# Patient Record
Sex: Male | Born: 1955 | Race: White | Hispanic: No | Marital: Single | Smoking: Never smoker
Health system: Southern US, Community
[De-identification: ages and names within clinical notes are randomized; demographics above are authoritative.]

## PROBLEM LIST (undated history)

## (undated) DIAGNOSIS — I4891 Unspecified atrial fibrillation: Secondary | ICD-10-CM

## (undated) DIAGNOSIS — I5042 Chronic combined systolic (congestive) and diastolic (congestive) heart failure: Secondary | ICD-10-CM

## (undated) DIAGNOSIS — I5032 Chronic diastolic (congestive) heart failure: Secondary | ICD-10-CM

## (undated) DIAGNOSIS — T8859XA Other complications of anesthesia, initial encounter: Secondary | ICD-10-CM

## (undated) DIAGNOSIS — G4733 Obstructive sleep apnea (adult) (pediatric): Secondary | ICD-10-CM

## (undated) DIAGNOSIS — F32A Depression, unspecified: Secondary | ICD-10-CM

## (undated) DIAGNOSIS — E119 Type 2 diabetes mellitus without complications: Secondary | ICD-10-CM

## (undated) DIAGNOSIS — I509 Heart failure, unspecified: Secondary | ICD-10-CM

## (undated) DIAGNOSIS — G4730 Sleep apnea, unspecified: Secondary | ICD-10-CM

## (undated) DIAGNOSIS — Z9289 Personal history of other medical treatment: Secondary | ICD-10-CM

## (undated) HISTORY — DX: Personal history of other medical treatment: Z92.89

## (undated) HISTORY — DX: Morbid (severe) obesity due to excess calories: E66.01

## (undated) HISTORY — DX: Chronic diastolic (congestive) heart failure: I50.32

## (undated) HISTORY — DX: Unspecified atrial fibrillation: I48.91

## (undated) HISTORY — DX: Type 2 diabetes mellitus without complications: E11.9

---

## 2011-03-14 DIAGNOSIS — I4819 Other persistent atrial fibrillation: Secondary | ICD-10-CM

## 2011-03-14 HISTORY — DX: Other persistent atrial fibrillation: I48.19

## 2011-03-26 ENCOUNTER — Emergency Department (HOSPITAL_COMMUNITY): Payer: Self-pay

## 2011-03-26 ENCOUNTER — Encounter (HOSPITAL_COMMUNITY): Payer: Self-pay | Admitting: Emergency Medicine

## 2011-03-26 ENCOUNTER — Inpatient Hospital Stay (HOSPITAL_COMMUNITY)
Admission: EM | Admit: 2011-03-26 | Discharge: 2011-04-04 | DRG: 308 | Disposition: A | Discharge status: Home / Self Care | Payer: 59 | Attending: Family Medicine | Admitting: Family Medicine

## 2011-03-26 ENCOUNTER — Other Ambulatory Visit: Payer: Self-pay

## 2011-03-26 DIAGNOSIS — I2489 Other forms of acute ischemic heart disease: Secondary | ICD-10-CM | POA: Diagnosis present

## 2011-03-26 DIAGNOSIS — E119 Type 2 diabetes mellitus without complications: Secondary | ICD-10-CM | POA: Diagnosis present

## 2011-03-26 DIAGNOSIS — Z6841 Body Mass Index (BMI) 40.0 and over, adult: Secondary | ICD-10-CM

## 2011-03-26 DIAGNOSIS — I2510 Atherosclerotic heart disease of native coronary artery without angina pectoris: Secondary | ICD-10-CM | POA: Diagnosis present

## 2011-03-26 DIAGNOSIS — J069 Acute upper respiratory infection, unspecified: Secondary | ICD-10-CM | POA: Diagnosis present

## 2011-03-26 DIAGNOSIS — Z7901 Long term (current) use of anticoagulants: Secondary | ICD-10-CM

## 2011-03-26 DIAGNOSIS — G4733 Obstructive sleep apnea (adult) (pediatric): Secondary | ICD-10-CM | POA: Diagnosis present

## 2011-03-26 DIAGNOSIS — Z23 Encounter for immunization: Secondary | ICD-10-CM

## 2011-03-26 DIAGNOSIS — I2789 Other specified pulmonary heart diseases: Secondary | ICD-10-CM | POA: Diagnosis present

## 2011-03-26 DIAGNOSIS — I4891 Unspecified atrial fibrillation: Principal | ICD-10-CM | POA: Diagnosis present

## 2011-03-26 DIAGNOSIS — I4819 Other persistent atrial fibrillation: Secondary | ICD-10-CM | POA: Diagnosis present

## 2011-03-26 DIAGNOSIS — I509 Heart failure, unspecified: Secondary | ICD-10-CM | POA: Diagnosis present

## 2011-03-26 DIAGNOSIS — I517 Cardiomegaly: Secondary | ICD-10-CM | POA: Diagnosis present

## 2011-03-26 DIAGNOSIS — Z79899 Other long term (current) drug therapy: Secondary | ICD-10-CM

## 2011-03-26 DIAGNOSIS — I5030 Unspecified diastolic (congestive) heart failure: Secondary | ICD-10-CM | POA: Diagnosis present

## 2011-03-26 DIAGNOSIS — R0902 Hypoxemia: Secondary | ICD-10-CM | POA: Diagnosis present

## 2011-03-26 DIAGNOSIS — I248 Other forms of acute ischemic heart disease: Secondary | ICD-10-CM | POA: Diagnosis present

## 2011-03-26 DIAGNOSIS — J441 Chronic obstructive pulmonary disease with (acute) exacerbation: Secondary | ICD-10-CM | POA: Diagnosis present

## 2011-03-26 DIAGNOSIS — J189 Pneumonia, unspecified organism: Secondary | ICD-10-CM | POA: Diagnosis present

## 2011-03-26 HISTORY — DX: Sleep apnea, unspecified: G47.30

## 2011-03-26 LAB — CBC
Hemoglobin: 16 g/dL (ref 13.0–17.0)
MCH: 28.6 pg (ref 26.0–34.0)
MCV: 84.6 fL (ref 78.0–100.0)
Platelets: 195 10*3/uL (ref 150–400)
RBC: 5.59 MIL/uL (ref 4.22–5.81)
WBC: 8.1 10*3/uL (ref 4.0–10.5)

## 2011-03-26 LAB — POCT I-STAT, CHEM 8
BUN: 16 mg/dL (ref 6–23)
Creatinine, Ser: 1 mg/dL (ref 0.50–1.35)
Glucose, Bld: 115 mg/dL — ABNORMAL HIGH (ref 70–99)
Potassium: 4.2 mEq/L (ref 3.5–5.1)
Sodium: 134 mEq/L — ABNORMAL LOW (ref 135–145)

## 2011-03-26 LAB — URINE MICROSCOPIC-ADD ON

## 2011-03-26 LAB — DIFFERENTIAL
Eosinophils Relative: 0 % (ref 0–5)
Lymphocytes Relative: 16 % (ref 12–46)
Lymphs Abs: 1.3 10*3/uL (ref 0.7–4.0)
Monocytes Relative: 11 % (ref 3–12)

## 2011-03-26 LAB — URINALYSIS, ROUTINE W REFLEX MICROSCOPIC
Glucose, UA: NEGATIVE mg/dL
Hgb urine dipstick: NEGATIVE
Leukocytes, UA: NEGATIVE
pH: 5.5 (ref 5.0–8.0)

## 2011-03-26 LAB — PROTIME-INR
INR: 1.08 (ref 0.00–1.49)
Prothrombin Time: 14.2 seconds (ref 11.6–15.2)

## 2011-03-26 LAB — POCT I-STAT 3, ART BLOOD GAS (G3+)
Bicarbonate: 27.8 mEq/L — ABNORMAL HIGH (ref 20.0–24.0)
O2 Saturation: 97 %
TCO2: 29 mmol/L (ref 0–100)

## 2011-03-26 MED ORDER — ALBUTEROL SULFATE (5 MG/ML) 0.5% IN NEBU: 2.5000 mg | INHALATION_SOLUTION | Freq: Once | RESPIRATORY_TRACT | Status: DC

## 2011-03-26 MED ORDER — DILTIAZEM HCL 100 MG IV SOLR
5.0000 mg/h | INTRAVENOUS | Status: DC
  Administered 2011-03-26: 10 mg/h via INTRAVENOUS
  Administered 2011-03-27: 15 mg/h via INTRAVENOUS
  Filled 2011-03-26: qty 100

## 2011-03-26 MED ORDER — DILTIAZEM HCL 50 MG/10ML IV SOLN
25.0000 mg | Freq: Once | INTRAVENOUS | Status: DC
  Filled 2011-03-26: qty 5

## 2011-03-26 MED ORDER — AZITHROMYCIN 500 MG PO TABS
500.0000 mg | ORAL_TABLET | Freq: Every day | ORAL | Status: AC
  Administered 2011-03-27: 500 mg via ORAL
  Filled 2011-03-26: qty 1

## 2011-03-26 MED ORDER — GUAIFENESIN-CODEINE 100-10 MG/5ML PO SOLN
5.0000 mL | ORAL | Status: DC | PRN
  Administered 2011-03-27 – 2011-04-03 (×3): 5 mL via ORAL
  Filled 2011-03-26 (×4): qty 5

## 2011-03-26 MED ORDER — IPRATROPIUM BROMIDE 0.02 % IN SOLN
0.5000 mg | RESPIRATORY_TRACT | Status: DC
  Administered 2011-03-27 – 2011-03-28 (×8): 0.5 mg via RESPIRATORY_TRACT
  Filled 2011-03-26 (×9): qty 2.5

## 2011-03-26 MED ORDER — DILTIAZEM HCL 50 MG/10ML IV SOLN
25.0000 mg | Freq: Once | INTRAVENOUS | Status: DC
  Administered 2011-03-26: 25 mg via INTRAVENOUS

## 2011-03-26 MED ORDER — IPRATROPIUM BROMIDE 0.02 % IN SOLN
0.5000 mg | RESPIRATORY_TRACT | Status: DC | PRN
  Administered 2011-03-26: 0.5 mg via RESPIRATORY_TRACT
  Filled 2011-03-26: qty 2.5

## 2011-03-26 MED ORDER — SODIUM CHLORIDE 0.9 % IV BOLUS (SEPSIS)
250.0000 mL | Freq: Once | INTRAVENOUS | Status: AC
  Administered 2011-03-26: 1000 mL via INTRAVENOUS

## 2011-03-26 MED ORDER — SODIUM CHLORIDE 0.9 % IJ SOLN
3.0000 mL | Freq: Two times a day (BID) | INTRAMUSCULAR | Status: DC
  Administered 2011-03-27 (×2): 3 mL via INTRAVENOUS
  Administered 2011-03-28: 09:00:00 via INTRAVENOUS
  Administered 2011-03-28 – 2011-04-02 (×6): 3 mL via INTRAVENOUS

## 2011-03-26 MED ORDER — SODIUM CHLORIDE 0.9 % IV SOLN: INTRAVENOUS | Status: DC

## 2011-03-26 MED ORDER — ALBUTEROL SULFATE (5 MG/ML) 0.5% IN NEBU
2.5000 mg | INHALATION_SOLUTION | RESPIRATORY_TRACT | Status: DC | PRN
  Administered 2011-03-29: 2.5 mg via RESPIRATORY_TRACT
  Filled 2011-03-26: qty 0.5

## 2011-03-26 MED ORDER — ACETAMINOPHEN 325 MG PO TABS: 650.0000 mg | ORAL_TABLET | Freq: Four times a day (QID) | ORAL | Status: DC | PRN

## 2011-03-26 MED ORDER — DILTIAZEM HCL 25 MG/5ML IV SOLN
INTRAVENOUS | Status: AC
  Filled 2011-03-26: qty 5

## 2011-03-26 MED ORDER — PNEUMOCOCCAL VAC POLYVALENT 25 MCG/0.5ML IJ INJ
0.5000 mL | INJECTION | INTRAMUSCULAR | Status: AC
  Administered 2011-03-27: 0.5 mL via INTRAMUSCULAR
  Filled 2011-03-26: qty 0.5

## 2011-03-26 MED ORDER — SODIUM CHLORIDE 0.9 % IV SOLN
INTRAVENOUS | Status: DC
  Administered 2011-03-27 – 2011-03-30 (×2): via INTRAVENOUS

## 2011-03-26 MED ORDER — HEPARIN SODIUM (PORCINE) 5000 UNIT/ML IJ SOLN
5000.0000 [IU] | Freq: Three times a day (TID) | INTRAMUSCULAR | Status: DC
  Administered 2011-03-27: 5000 [IU] via SUBCUTANEOUS
  Filled 2011-03-26 (×4): qty 1

## 2011-03-26 MED ORDER — ACETAMINOPHEN 650 MG RE SUPP: 650.0000 mg | Freq: Four times a day (QID) | RECTAL | Status: DC | PRN

## 2011-03-26 MED ORDER — FUROSEMIDE 80 MG PO TABS
80.0000 mg | ORAL_TABLET | Freq: Once | ORAL | Status: AC
  Administered 2011-03-27: 80 mg via ORAL
  Filled 2011-03-26: qty 1

## 2011-03-26 MED ORDER — DILTIAZEM HCL 100 MG IV SOLR
5.0000 mg/h | Freq: Once | INTRAVENOUS | Status: AC
  Administered 2011-03-26: 5 mg/h via INTRAVENOUS

## 2011-03-26 MED ORDER — AZITHROMYCIN 250 MG PO TABS
250.0000 mg | ORAL_TABLET | Freq: Every day | ORAL | Status: AC
  Administered 2011-03-28 – 2011-03-31 (×4): 250 mg via ORAL
  Filled 2011-03-26 (×4): qty 1

## 2011-03-26 MED ORDER — ASPIRIN EC 81 MG PO TBEC
81.0000 mg | DELAYED_RELEASE_TABLET | Freq: Every day | ORAL | Status: DC
  Administered 2011-03-27 – 2011-03-31 (×5): 81 mg via ORAL
  Filled 2011-03-26 (×6): qty 1

## 2011-03-26 MED ORDER — IPRATROPIUM BROMIDE 0.02 % IN SOLN: 0.5000 mg | Freq: Once | RESPIRATORY_TRACT | Status: DC

## 2011-03-26 NOTE — ED Notes (Signed)
Pharmacist came to unit to discuss Cardizem with Dr. Radford Pax. Will continue to monitor.

## 2011-03-26 NOTE — H&P (Signed)
Darius [name] is an 56 y.o. male.   Family Medicine Teaching Service History and Physical Service Pager: [phone]  Chief Complaint: SOB, cough  HPI: Patient is a 56 yo M with PMH of obesity who presented to the ED with gradually worsening cough and shortness of breath. Starting Friday, patient began having increasing shortness of breath and developed a nonproductive cough. Over the weekend, patient states he went to work and he could only walk a few steps without feeling short of breath. States his breathing has been "rattling"; it has not gotten any better or worse in the last several days. He has tried Mucinex which did not help. Patient states he has had some swelling in his legs, and some weight gain recently. Denies any chest pain, and denies palpitations or racing heart beat.  He has decreased appetite and decreased sleep secondary to cough. Patient went to Urgent Care in [place], [place] this afternoon at request of his place of employment. He had CXR at that time which was was concerning for pneumonia. He received 2g CTX, Kenalog 80mg  IM, and a neb treatment in the office. He was then sent to [facility] for further evaluation.    Prior to this acute illness, patient states he was "not healthy" but he felt better than he does now. He does not have a Primary Care doctor. Last time he was seen by a physician was in 2000.   ED Course: On arrival to triage, patient's temp 99.2, pulse in the 60's and BP 165/92 . At time of evaluation by the ED provider, patient's pulse was 145 and EKG showed patient to be in atrial fib (new onset.) CXR showed cardiomegaly and bilateral fullness. BNP 718. Labs otherwise remarkable. Patient was started on Cardiazem drip to control rate. FPTS was called for admission.   Past Medical History  Diagnosis Date  . Obesity    Surgical History: Tonsillectomy (age 31)  Family History: Father- DM, HTN Mother- Deceased. Grand mal seizure/epilepsy Sister- No significant  history  Social History: Patient works in Office manager. Does not smoke. Does drink alcohol. Drinks 1-2 shots 5-6 nights per week.   Allergies: No Known Allergies  Prior to Admission medications   Medication Sig Start Date End Date Taking? Authorizing Provider  guaiFENesin (MUCINEX) 600 MG 12 hr tablet Take 1,200 mg by mouth 2 (two) times daily as needed. As needed for congestion.   Yes Historical Provider, MD    Results for orders placed during the hospital encounter of 03/26/11 (from the past 48 hour(s))  CBC     Status: Normal   Collection Time   03/26/11  3:13 PM      Component Value Range Comment   WBC 8.1  4.0 - 10.5 (K/uL)    RBC 5.59  4.22 - 5.81 (MIL/uL)    Hemoglobin 16.0  13.0 - 17.0 (g/dL)    HCT 45.4  09.8 - 11.9 (%)    MCV 84.6  78.0 - 100.0 (fL)    MCH 28.6  26.0 - 34.0 (pg)    MCHC 33.8  30.0 - 36.0 (g/dL)    RDW 14.7  82.9 - 56.2 (%)    Platelets 195  150 - 400 (K/uL)   DIFFERENTIAL     Status: Normal   Collection Time   03/26/11  3:13 PM      Component Value Range Comment   Neutrophils Relative 73  43 - 77 (%)    Neutro Abs 5.9  1.7 - 7.7 (K/uL)  Lymphocytes Relative 16  12 - 46 (%)    Lymphs Abs 1.3  0.7 - 4.0 (K/uL)    Monocytes Relative 11  3 - 12 (%)    Monocytes Absolute 0.9  0.1 - 1.0 (K/uL)    Eosinophils Relative 0  0 - 5 (%)    Eosinophils Absolute 0.0  0.0 - 0.7 (K/uL)    Basophils Relative 0  0 - 1 (%)    Basophils Absolute 0.0  0.0 - 0.1 (K/uL)   POCT I-STAT, CHEM 8     Status: Abnormal   Collection Time   03/26/11  3:38 PM      Component Value Range Comment   Sodium 134 (*) 135 - 145 (mEq/L)    Potassium 4.2  3.5 - 5.1 (mEq/L)    Chloride 95 (*) 96 - 112 (mEq/L)    BUN 16  6 - 23 (mg/dL)    Creatinine, Ser 1.61  0.50 - 1.35 (mg/dL)    Glucose, Bld 096 (*) 70 - 99 (mg/dL)    Calcium, Ion 0.45 (*) 1.12 - 1.32 (mmol/L)    TCO2 28  0 - 100 (mmol/L)    Hemoglobin 17.3 (*) 13.0 - 17.0 (g/dL)    HCT 40.9  81.1 - 91.4 (%)   PRO B NATRIURETIC  PEPTIDE     Status: Abnormal   Collection Time   03/26/11  7:45 PM      Component Value Range Comment   Pro B Natriuretic peptide (BNP) 714.8 (*) 0 - 125 (pg/mL)   PROTIME-INR     Status: Normal   Collection Time   03/26/11  8:15 PM      Component Value Range Comment   Prothrombin Time 14.2  11.6 - 15.2 (seconds)    INR 1.08  0.00 - 1.49    URINALYSIS, ROUTINE W REFLEX MICROSCOPIC     Status: Abnormal   Collection Time   03/26/11  8:17 PM      Component Value Range Comment   Color, Urine YELLOW  YELLOW     APPearance CLEAR  CLEAR     Specific Gravity, Urine 1.026  1.005 - 1.030     pH 5.5  5.0 - 8.0     Glucose, UA NEGATIVE  NEGATIVE (mg/dL)    Hgb urine dipstick NEGATIVE  NEGATIVE     Bilirubin Urine NEGATIVE  NEGATIVE     Ketones, ur NEGATIVE  NEGATIVE (mg/dL)    Protein, ur 782 (*) NEGATIVE (mg/dL)    Urobilinogen, UA 1.0  0.0 - 1.0 (mg/dL)    Nitrite NEGATIVE  NEGATIVE     Leukocytes, UA NEGATIVE  NEGATIVE    URINE MICROSCOPIC-ADD ON     Status: Normal   Collection Time   03/26/11  8:17 PM      Component Value Range Comment   Squamous Epithelial / LPF RARE  RARE     WBC, UA 0-2  <3 (WBC/hpf)    Bacteria, UA RARE  RARE    POCT I-STAT TROPONIN I     Status: Normal   Collection Time   03/26/11  8:35 PM      Component Value Range Comment   Troponin i, poc 0.01  0.00 - 0.08 (ng/mL)    Comment 3             Dg Chest 2 View  03/26/2011  *RADIOLOGY REPORT*  Clinical Data: Respiratory distress, questionable fever, cough  CHEST - 2 VIEW  Comparison: None.  Findings: Enlarged  cardiac silhouette and mediastinal contours. There is fullness of bilateral pulmonary hila.  Mild pulmonary venous congestion without frank evidence of pulmonary edema.  Right basilar airspace opacities.  Bibasilar heterogeneous opacities.  No definite pleural effusion or pneumothorax.  Mild age indeterminate anterior compression deformity of a mid thoracic vertebral body.  IMPRESSION:  1.  Right lower lung  airspace opacities worrisome for developing infection.  A follow-up chest radiograph in 4 to 6 weeks after treatment is recommended to ensure resolution. 2.  Borderline enlarged cardiac silhouette and mediastinal contours with fullness of the bilateral pulmonary hila, nonspecific but may be seen in the setting of pulmonary arterial hypertension.  Further evaluation with cardiac echo may be obtained as clinically indicated.  Original Report Authenticated By: Waynard Reeds, M.D.    ROS Please see HPI above  Blood pressure 119/80, pulse 145, temperature 99.2 F (37.3 C), temperature source Oral, resp. rate 18, SpO2 96.00%. Physical Exam  Constitutional: He is oriented to person, place, and time. He appears well-developed and well-nourished. He appears distressed (Appears uncomfortable. Increased distress with cough.).  HENT:  Head: Normocephalic and atraumatic.  Eyes: Pupils are equal, round, and reactive to light.  Neck: Normal range of motion.  Cardiovascular: Intact distal pulses and normal pulses.  An irregularly irregular rhythm present. Tachycardia present.   Respiratory: Effort normal. He has wheezes. He has rales.       Prolonged expiratory phase  GI: Soft. There is no tenderness.  Musculoskeletal: Normal range of motion. He exhibits edema (Trace edema).  Neurological: He is alert and oriented to person, place, and time. No cranial nerve deficit.  Skin: Skin is warm and dry. No rash noted.     Assessment/Plan 56 yo M with no significant PMH admitted for new onset atrial fibrillation  1. Atrial Fibrillation- New onset A fib. Patient asymptomatic; denies CP or palpitations. Started on Diltiazem drip in the ED, but is not rate controlled at time of admission.  - Admit to step down unit. Continue to monitor closely on Telemetry - Continue Diltiazem drip until rate controlled (titrate as necessary) - Will transition to PO when patient is more stable. - Will start ASA 81 mg. No need for  anticoagulation at this time. - Cardiac enzymes x2.  2. ?CHF- BNP 714. Patient states he has had some edema, as well as shortness of breath and cough.  - EKG in the AM - Will order 2D echo - Stop IVF except what necessary for Diltiazem drip - Lasix 80mg  PO once. (Creatinine 1.0) - Strict I/O and daily standing weights  3. Hypoxia- Patient with nonproductive cough and wheezing. Sating in the low 90's on 3L O2 in the ED. No signs of pneumonia on CXR. WBC within normal limit. Afebrile. - Repeat 2 view CXR in the AM - Will do Atrovent nebs q4 hours, and Albuterol q2 prn for wheezing - ABG pending - O2 via [place] to maintain sats >90% - CBC in the AM. Continue to trend WBC and fevers  4. FEN/GI- Low sodium diet 5. PPx- Heparin SQ 6. Dispo- Pending further evaluation and clinical improvement  HAIR[place]ORD, AMBER 03/26/2011, 9:25 PM   PGY3 addendum: I was present and participated durring the history and physical. We discussed the note and assessment and plan. I agree with Dr. Algis Downs note.

## 2011-03-26 NOTE — ED Notes (Signed)
PT O2 was 84% on room air.

## 2011-03-26 NOTE — ED Notes (Signed)
Patient C/O cough, congestion and dyspnea since Thursday.  States that he has pneumonia. Denies fever , nausea and vomiting.  Breath sounds are coarse, wheezes noted throughout. Breath sounds are distant in bases.

## 2011-03-26 NOTE — ED Notes (Signed)
Called Respiratory to come draw ABG.

## 2011-03-26 NOTE — ED Notes (Signed)
Spoke with pharmacy regarding sending pts Cardizem ASAp. Stated they are waiting for the pharmacist to verify medication. Once this is done they will send medication. Will continue to monitor.

## 2011-03-26 NOTE — ED Notes (Signed)
Pt stated that he has been SOB x 3 days with it becoming increasingly worst. He went to the Urgent care today and they told him to come here because they thought he had PNA. Please see primary, cardiac, and respiratory assessments for details on findings.

## 2011-03-26 NOTE — ED Notes (Signed)
States has felt well x 3 days went to dr and dx w/ pneumonia and has been very sob on exertion dx w/

## 2011-03-26 NOTE — ED Notes (Signed)
Pt placed on 3 l [place]  

## 2011-03-26 NOTE — ED Notes (Signed)
Admitting at bedside 

## 2011-03-27 ENCOUNTER — Other Ambulatory Visit: Payer: Self-pay

## 2011-03-27 ENCOUNTER — Inpatient Hospital Stay (HOSPITAL_COMMUNITY): Payer: Self-pay

## 2011-03-27 ENCOUNTER — Encounter (HOSPITAL_COMMUNITY): Payer: Self-pay | Admitting: Physician Assistant

## 2011-03-27 DIAGNOSIS — I4819 Other persistent atrial fibrillation: Secondary | ICD-10-CM | POA: Diagnosis present

## 2011-03-27 DIAGNOSIS — I4891 Unspecified atrial fibrillation: Secondary | ICD-10-CM

## 2011-03-27 DIAGNOSIS — J069 Acute upper respiratory infection, unspecified: Secondary | ICD-10-CM | POA: Diagnosis present

## 2011-03-27 LAB — CBC
HCT: 46.6 % (ref 39.0–52.0)
Hemoglobin: 15.9 g/dL (ref 13.0–17.0)
MCH: 27.2 pg (ref 26.0–34.0)
MCHC: 31.3 g/dL (ref 30.0–36.0)
MCV: 86.9 fL (ref 78.0–100.0)
Platelets: 199 10*3/uL (ref 150–400)
Platelets: 195 10*3/uL (ref 150–400)
RBC: 5.55 MIL/uL (ref 4.22–5.81)
RDW: 14 % (ref 11.5–15.5)
WBC: 5.4 10*3/uL (ref 4.0–10.5)

## 2011-03-27 LAB — RAPID URINE DRUG SCREEN, HOSP PERFORMED
Barbiturates: NOT DETECTED
Benzodiazepines: NOT DETECTED
Cocaine: NOT DETECTED
Opiates: POSITIVE — AB

## 2011-03-27 LAB — COMPREHENSIVE METABOLIC PANEL
ALT: 71 U/L — ABNORMAL HIGH (ref 0–53)
Albumin: 3.4 g/dL — ABNORMAL LOW (ref 3.5–5.2)
Alkaline Phosphatase: 65 U/L (ref 39–117)
BUN: 17 mg/dL (ref 6–23)
Chloride: 95 mEq/L — ABNORMAL LOW (ref 96–112)
Potassium: 4.4 mEq/L (ref 3.5–5.1)
Sodium: 134 mEq/L — ABNORMAL LOW (ref 135–145)
Total Bilirubin: 0.6 mg/dL (ref 0.3–1.2)
Total Protein: 6.9 g/dL (ref 6.0–8.3)

## 2011-03-27 LAB — POCT I-STAT 3, ART BLOOD GAS (G3+)
pCO2 arterial: 56.1 mmHg — ABNORMAL HIGH (ref 35.0–45.0)
pH, Arterial: 7.35 (ref 7.350–7.450)
pO2, Arterial: 73 mmHg — ABNORMAL LOW (ref 80.0–100.0)

## 2011-03-27 LAB — CARDIAC PANEL(CRET KIN+CKTOT+MB+TROPI)
Relative Index: 0.8 (ref 0.0–2.5)
Total CK: 773 U/L — ABNORMAL HIGH (ref 7–232)
Troponin I: <0.3 ng/mL (ref <0.30)

## 2011-03-27 LAB — CREATININE, SERUM
Creatinine, Ser: 0.85 mg/dL (ref 0.50–1.35)
GFR calc non Af Amer: >90 mL/min (ref >90)

## 2011-03-27 LAB — TSH: TSH: 0.641 u[IU]/mL (ref 0.350–4.500)

## 2011-03-27 LAB — HEPARIN LEVEL (UNFRACTIONATED): Heparin Unfractionated: <0.1 IU/mL — ABNORMAL LOW (ref 0.30–0.70)

## 2011-03-27 LAB — MRSA PCR SCREENING: MRSA by PCR: NEGATIVE

## 2011-03-27 LAB — HEMOGLOBIN A1C: Hgb A1c MFr Bld: 6.8 % — ABNORMAL HIGH (ref <5.7)

## 2011-03-27 MED ORDER — WARFARIN SODIUM 10 MG PO TABS
10.0000 mg | ORAL_TABLET | Freq: Once | ORAL | Status: AC
  Administered 2011-03-27: 10 mg via ORAL
  Filled 2011-03-27: qty 1

## 2011-03-27 MED ORDER — DEXTROSE 5 % IV SOLN
1.0000 g | INTRAVENOUS | Status: DC
  Administered 2011-03-27: 1 g via INTRAVENOUS
  Filled 2011-03-27 (×2): qty 10

## 2011-03-27 MED ORDER — FUROSEMIDE 10 MG/ML IJ SOLN
40.0000 mg | Freq: Two times a day (BID) | INTRAMUSCULAR | Status: DC
  Administered 2011-03-27 – 2011-04-02 (×12): 40 mg via INTRAVENOUS
  Filled 2011-03-27 (×16): qty 4

## 2011-03-27 MED ORDER — PREDNISONE 50 MG PO TABS
50.0000 mg | ORAL_TABLET | Freq: Every day | ORAL | Status: DC
  Administered 2011-03-28 – 2011-04-04 (×8): 50 mg via ORAL
  Filled 2011-03-27 (×10): qty 1

## 2011-03-27 MED ORDER — LORAZEPAM 1 MG PO TABS: 1.0000 mg | ORAL_TABLET | Freq: Four times a day (QID) | ORAL | Status: AC | PRN

## 2011-03-27 MED ORDER — PATIENT'S GUIDE TO USING COUMADIN BOOK
Freq: Once | Status: AC
  Administered 2011-03-27: 17:00:00
  Filled 2011-03-27: qty 1

## 2011-03-27 MED ORDER — POTASSIUM CHLORIDE CRYS ER 20 MEQ PO TBCR
20.0000 meq | EXTENDED_RELEASE_TABLET | Freq: Two times a day (BID) | ORAL | Status: DC
  Administered 2011-03-27 – 2011-03-31 (×9): 20 meq via ORAL
  Filled 2011-03-27 (×11): qty 1

## 2011-03-27 MED ORDER — ADULT MULTIVITAMIN W/MINERALS CH
1.0000 | ORAL_TABLET | Freq: Every day | ORAL | Status: DC
  Administered 2011-03-28 – 2011-04-04 (×8): 1 via ORAL
  Filled 2011-03-27 (×8): qty 1

## 2011-03-27 MED ORDER — HEPARIN BOLUS VIA INFUSION
4000.0000 [IU] | Freq: Once | INTRAVENOUS | Status: AC
  Administered 2011-03-27: 4000 [IU] via INTRAVENOUS
  Filled 2011-03-27: qty 4000

## 2011-03-27 MED ORDER — HEPARIN SOD (PORCINE) IN D5W 100 UNIT/ML IV SOLN
2450.0000 [IU]/h | INTRAVENOUS | Status: DC
  Administered 2011-03-27: 2200 [IU]/h via INTRAVENOUS
  Administered 2011-03-27: 2400 [IU]/h via INTRAVENOUS
  Administered 2011-03-27: 1800 [IU]/h via INTRAVENOUS
  Administered 2011-03-27: 2200 [IU]/h via INTRAVENOUS
  Administered 2011-03-28: 2600 [IU]/h via INTRAVENOUS
  Filled 2011-03-27 (×9): qty 250

## 2011-03-27 MED ORDER — WARFARIN VIDEO: Freq: Once | Status: DC

## 2011-03-27 MED ORDER — LORAZEPAM 2 MG/ML IJ SOLN: 1.0000 mg | Freq: Four times a day (QID) | INTRAMUSCULAR | Status: AC | PRN

## 2011-03-27 MED ORDER — FUROSEMIDE 40 MG PO TABS: 40.0000 mg | ORAL_TABLET | Freq: Every day | ORAL | Status: DC

## 2011-03-27 MED ORDER — DILTIAZEM HCL ER COATED BEADS 360 MG PO CP24
360.0000 mg | ORAL_CAPSULE | Freq: Every day | ORAL | Status: DC
  Administered 2011-03-27 – 2011-04-03 (×8): 360 mg via ORAL
  Filled 2011-03-27 (×9): qty 1

## 2011-03-27 NOTE — Progress Notes (Signed)
ANTICOAGULATION CONSULT NOTE - Initial Consult  Pharmacy Consult for heparin  Indication: atrial fibrillation  No Known Allergies  Patient Measurements: Height: 6\' 2"  (188 cm) Weight: 331 lb 2.1 oz (150.2 kg) IBW/kg (Calculated) : 82.2  Heparin Dosing Weight:   Vital Signs: Temp: 98.1 F (36.7 C) (02/14 0317) Temp src: Oral (02/14 0317) BP: 130/67 mmHg (02/14 0300) Pulse Rate: 86  (02/14 0700)  Labs:  Basename 03/27/11 0555 03/26/11 2348 03/26/11 2015 03/26/11 1538 03/26/11 1513  HGB 14.6 15.9 -- -- --  HCT 46.6 48.0 -- 51.0 --  PLT 195 199 -- -- 195  APTT -- -- -- -- --  LABPROT -- -- 14.2 -- --  INR -- -- 1.08 -- --  HEPARINUNFRC -- -- -- -- --  CREATININE 0.90 0.85 -- 1.00 --  CKTOTAL 822* 773* -- -- --  CKMB 7.5* 6.5* -- -- --  TROPONINI <0.30 <0.30 -- -- --   Estimated Creatinine Clearance: 140.1 ml/min (by C-G formula based on Cr of 0.9).  Medical History: Past Medical History  Diagnosis Date  . Obesity     Medications:  Prescriptions prior to admission  Medication Sig Dispense Refill  . guaiFENesin (MUCINEX) 600 MG 12 hr tablet Take 1,200 mg by mouth 2 (two) times daily as needed. As needed for congestion.        Assessment: afib  Goal of Therapy:  Heparin level 0.3-0.7 units/ml   Plan:  Heparin 4000 unitsx1 then 1800 units/hr. Check 6 hour HL and daily HL/cbc starting 2/15 with am labs  Janice Coffin 03/27/2011,7:55 AM

## 2011-03-27 NOTE — Progress Notes (Signed)
PGY-1 Daily Progress Note Family Medicine Teaching Service [place]. Vinessa Macconnell, MD Service Pager: [phone]   Subjective: Patient states he is feeling better today. He is breathing more comfortably on O2. He denies any CP or palpitations. Had breakfast this morning. No acute concerns.  Objective: Vital signs in last 24 hours: Temp:  [97.7 F (36.5 C)-99.2 F (37.3 C)] 98.1 F (36.7 C) (02/14 0317) Pulse Rate:  [66-145] 89  (02/14 0600) Resp:  [14-27] 22  (02/14 0600) BP: (119-165)/(58-92) 130/67 mmHg (02/14 0300) SpO2:  [88 %-98 %] 93 % (02/14 0600) Weight:  [331 lb 2.1 oz (150.2 kg)-333 lb 5.4 oz (151.2 kg)] 331 lb 2.1 oz (150.2 kg) (02/14 0500) Weight change:  Last BM Date: 03/26/11  Intake/Output from previous day: 02/13 0701 - 02/14 0700 In: 564.2 [P.O.:375; I.V.:189.2] Out: 1975 [Urine:1975] Intake/Output this shift: Total I/O In: 564.2 [P.O.:375; I.V.:189.2] Out: 1975 [Urine:1975]  Physical Exam: General appearance: alert, cooperative and no distress. Sitting comfortably in bed Head: Normocephalic, without obvious abnormality, atraumatic. [place] in place Resp: Good effort, diffuse wheezes bilaterally. No crackles Cardio: irregularly irregular rhythm, regular rate. Distant heart sounds GI: soft, non-tender; bowel sounds normal Extremities: extremities normal, atraumatic, 1+ pitting edema Neurologic: Grossly normal  Lab Results:  Basename 03/26/11 2348 03/26/11 1538 03/26/11 1513  WBC 7.7 -- 8.1  HGB 15.9 17.3* --  HCT 48.0 51.0 --  PLT 199 -- 195   BMET  Basename 03/26/11 2348 03/26/11 1538  NA -- 134*  K -- 4.2  CL -- 95*  CO2 -- --  GLUCOSE -- 115*  BUN -- 16  CREATININE 0.85 1.00  CALCIUM -- --    Studies/Results: Dg Chest 2 View  03/26/2011  *RADIOLOGY REPORT*  Clinical Data: Respiratory distress, questionable fever, cough  CHEST - 2 VIEW  Comparison: None.  Findings: Enlarged cardiac silhouette and mediastinal contours. There is fullness of bilateral  pulmonary hila.  Mild pulmonary venous congestion without frank evidence of pulmonary edema.  Right basilar airspace opacities.  Bibasilar heterogeneous opacities.  No definite pleural effusion or pneumothorax.  Mild age indeterminate anterior compression deformity of a mid thoracic vertebral body.  IMPRESSION:  1.  Right lower lung airspace opacities worrisome for developing infection.  A follow-up chest radiograph in 4 to 6 weeks after treatment is recommended to ensure resolution. 2.  Borderline enlarged cardiac silhouette and mediastinal contours with fullness of the bilateral pulmonary hila, nonspecific but may be seen in the setting of pulmonary arterial hypertension.  Further evaluation with cardiac echo may be obtained as clinically indicated.  Original Report Authenticated By: Waynard Reeds, M.D.    Medications:  I have reviewed the patient's current medications. Scheduled:   . aspirin EC  81 mg Oral Daily  . azithromycin  500 mg Oral Daily   Followed by  . azithromycin  250 mg Oral Daily  . diltiazem (CARDIZEM) infusion  5 mg/hr Intravenous Once  . furosemide  80 mg Oral Once  . heparin  5,000 Units Subcutaneous Q8H  . ipratropium  0.5 mg Nebulization Q4H  . pneumococcal 23 valent vaccine  0.5 mL Intramuscular Tomorrow-1000  . sodium chloride  250 mL Intravenous Once  . sodium chloride  3 mL Intravenous Q12H  . DISCONTD: albuterol  2.5 mg Nebulization Once  . DISCONTD: diltiazem  25 mg Intravenous Once  . DISCONTD: diltiazem  25 mg Intravenous Once  . DISCONTD: ipratropium  0.5 mg Nebulization Once   Continuous:   . sodium chloride 10  mL/hr at 03/27/11 0000  . diltiazem (CARDIZEM) infusion 15 mg/hr (03/27/11 0102)  . DISCONTD: sodium chloride     OZH:[id number], acetaminophen, albuterol, guaiFENesin-codeine, DISCONTD: ipratropium  Assessment/Plan: 56 yo M with no significant PMH admitted for new onset atrial fibrillation   1. Atrial Fibrillation- New onset A fib.  Patient asymptomatic; denies CP or palpitations. Started on Diltiazem drip in the ED, but was not rate controlled at time of admission therefore the Dilt trip was titrated and continued overnight. Pulse rate in the 80's now.  - Will start Heparin drip. Will make NPO and call cardiology for consult. (Patient did have breakfast this morning) - Continue to monitor closely on Telemetry  - Continue Diltiazem drip until rate controlled (titrate as necessary)  - Will transition to PO medication when patient is more stable, per cards recommendations.  - Will continue ASA 81 mg. - Cardiac enzymes showed negative trop, elevated CK to 822, CKMB 7.5  2. ?CHF- BNP 714. Patient states he has had some edema, as well as shortness of breath and cough.  - EKG shows A fib. - 2D echo today. TEE per cards recs - Stop IVF except what necessary for Diltiazem drip  - Lasix 80mg  PO once overnight with good urine OP. Will consider dosing again, if needed - Strict I/O and daily standing weights   3. Hypoxia- Patient with nonproductive cough and wheezing. Sating in the low 90's on 3L O2 in the ED. No signs of pneumonia on CXR. WBC within normal limit. Afebrile.  - Repeat 2 view CXR shows improved aeration, cardiomegaly without congestive failure and peribronchial thickening related to chronic bronchitis vs.smoking. - Will continue Atrovent nebs q4 hours, and Albuterol q2 prn for wheezing  - O2 via [place] to maintain sats >90%. No need for BiPAP at this time. - WBC 5.4 this morning. - Received CTX in the ED. Will continue this and started on Azithro today for ?COPD exacerbation. - Given his breathing status, will consider starting Prednisone 50mg  - Continue cough syrup with codeine as needed for coughing spells.  4. FEN/GI- NPO 5. PPx- Heparin drip 6. Dispo- Pending further evaluation and clinical improvement    LOS: 1 day   Lavonne Cass 03/27/2011, 6:59 AM

## 2011-03-27 NOTE — Progress Notes (Addendum)
ANTICOAGULATION CONSULT NOTE - Follow Up Consult  Pharmacy Consult for warfarin Indication: atrial fibrillation  No Known Allergies  Patient Measurements: Height: 6\' 2"  (188 cm) Weight: 331 lb 2.1 oz (150.2 kg) IBW/kg (Calculated) : 82.2    Vital Signs: Temp: 97.8 F (36.6 C) (02/14 0805) Temp src: Oral (02/14 0805) BP: 121/75 mmHg (02/14 0805) Pulse Rate: 90  (02/14 1015)  Labs:  Basename 03/27/11 0555 03/26/11 2348 03/26/11 2015 03/26/11 1538 03/26/11 1513  HGB 14.6 15.9 -- -- --  HCT 46.6 48.0 -- 51.0 --  PLT 195 199 -- -- 195  APTT -- -- -- -- --  LABPROT -- -- 14.2 -- --  INR -- -- 1.08 -- --  HEPARINUNFRC -- -- -- -- --  CREATININE 0.90 0.85 -- 1.00 --  CKTOTAL 822* 773* -- -- --  CKMB 7.5* 6.5* -- -- --  TROPONINI <0.30 <0.30 -- -- --   Estimated Creatinine Clearance: 140.1 ml/min (by C-G formula based on Cr of 0.9).   Assessment: 56 yo male with newly diagnosed afib started on IV heparin this morning, will add warfarin therapy tonight. CHADS2 score pending awaiting further work up but looks to be 1-2(?HTN/HF), unlikely to have DM with cbg 115, 97. Patient does drink alcohol most days of the week which could cause some issues managing the warfarin, and his weight of 150kg means he will likely require large doses.   P.m. Heparin level undetectable, no issues with IV noted per nursing.  Goal of Therapy:  INR 2-3 Heparin level 0.3-0.7   Plan:  Start with 10mg  of warfarin tonight Order education materials  Rebolus heparin 4000 units x 1 Increase drip to 2200 units/hr 6hr HL  Darius [name] 03/27/2011,10:35 AM

## 2011-03-27 NOTE — Progress Notes (Signed)
  Echocardiogram 2D Echocardiogram has been performed.  Darius [name] 03/27/2011, 10:42 AM

## 2011-03-27 NOTE — Progress Notes (Signed)
ANTICOAGULATION CONSULT NOTE - Follow Up Consult  Pharmacy Consult for heparin Indication: atrial fibrillation  Labs:  Basename 03/27/11 2227 03/27/11 1334 03/27/11 0555 03/26/11 2348 03/26/11 2015 03/26/11 1538 03/26/11 1513  HGB -- -- 14.6 15.9 -- -- --  HCT -- -- 46.6 48.0 -- 51.0 --  PLT -- -- 195 199 -- -- 195  APTT -- -- -- -- -- -- --  LABPROT -- -- -- -- 14.2 -- --  INR -- -- -- -- 1.08 -- --  HEPARINUNFRC 0.28* <0.10* -- -- -- -- --  CREATININE -- -- 0.90 0.85 -- 1.00 --  CKTOTAL -- -- 822* 773* -- -- --  CKMB -- -- 7.5* 6.5* -- -- --  TROPONINI -- -- <0.30 <0.30 -- -- --   Assessment: 56yo male remains slightly subtherapeutic on heparin for Afib.  Goal of Therapy:  Heparin level 0.3-0.7 units/ml   Plan:  Will increase heparin gtt to 2400 units/hr and check level with am labs.  Colleen Can PharmD BCPS 03/27/2011,11:43 PM

## 2011-03-27 NOTE — Consult Note (Signed)
CARDIOLOGY CONSULT NOTE   Patient ID: Darius [name] MRN: [id number] DOB/AGE: 07/15/1955 56 y.o.  Admit date: 03/26/2011  Primary Physician   No primary provider on file. Primary Cardiologist   none Reason for Consultation   Atrial fib with RVR  Darius [name] is a 56 y.o. male with no history of CAD.  He had cough -> Urgent Care -> dx PNA and sent to ER -> Atrial fib/RVR seen on telemetry and cardiology asked to evaluate him.  Darius [name] has not seen a doctor in over a decade. He has a vague history of increasing DOE and over the last week, he began coughing and feeling bad. His DOE worsened to the point that he could only walk a few steps without stopping. He was waking in the night but this has been going on for a long time and is possibly secondary to OSA (previously undiagnosed). He has no awareness of an irregular HR or a rapid HR. He has never had chest pain. He thought he had "dropsy" before he went to the hospital but that was because his SOB was so bad. He went to Urgent Care and then to the hospital and feels a little better now. He still has no awareness of an irregular HR. He has no history of presyncope or syncope except with prolonged, hard, coughing. This first occurred over 10 years ago but is only a problem when he gets sick. He is resting comfortably now.   Past Medical History  Diagnosis Date  . Obesity      History reviewed. No pertinent past surgical history.  No Known Allergies  I have reviewed the patient's current medications. Scheduled:   . aspirin EC  81 mg Oral Daily  . azithromycin  500 mg Oral Daily   Followed by  . azithromycin  250 mg Oral Daily  . cefTRIAXone (ROCEPHIN)  IV  1 g Intravenous Q24H  . diltiazem (CARDIZEM) infusion  5 mg/hr Intravenous Once  . furosemide  80 mg Oral Once  . heparin  4,000 Units Intravenous Once  . ipratropium  0.5 mg Nebulization Q4H  . pneumococcal 23 valent vaccine  0.5 mL Intramuscular Tomorrow-1000  .  sodium chloride  250 mL Intravenous Once  . sodium chloride  3 mL Intravenous Q12H   Continuous:   . sodium chloride 10 mL/hr at 03/27/11 0000  . diltiazem (CARDIZEM) infusion 15 mg/hr (03/27/11 0102)  . heparin 1,800 Units/hr (03/27/11 0914)  . DISCONTD: sodium chloride     Prescriptions prior to admission  Medication Sig Dispense Refill  . guaiFENesin (MUCINEX) 600 MG 12 hr tablet Take 1,200 mg by mouth 2 (two) times daily as needed. As needed for congestion.         History   Social History  . Marital Status: Single    Spouse Name: N/A    Number of Children: N/A  . Years of Education: N/A   Occupational History  . 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 Smoking status: Current Some Day Smoker - Pt denies this  . Smokeless tobacco: No  . Alcohol Use: Yes - doesn't abuse alcohol. Had a little more than usual recently, using liquor as cough medicine.  . Drug Use: No  . Sexually Active:    Other Topics Concern  . Admits that he eats poorly and eats too much. Walks some at work but does not exercise.      Family history: Mother died in  her 33s of a seizure and dad died in his 70s with DM/HTN but neither of his parents nor his sibling have any cardiac issues.  ROS: He is not aware of LE edema. Admits that he snores and wakes up frequently during the night (?OSA). Denies melena. No fevers. Cough is non-productive. Eats poorly and weight has trended up gradually over years. Full 14 point review of systems complete and found to be negative unless listed  above  Physical Exam: Blood pressure 121/75, pulse 97, temperature 97.8 F (36.6 C), temperature source Oral, resp. rate 21, height 6\' 2"  (1.88 m), weight 331 lb 2.1 oz (150.2 kg), SpO2 95.00%.   General: Well developed, obese male, in no acute distress Head: Eyes PERRLA, No xanthomas.  Normocephalic and atraumatic, oropharynx without edema or exudate. Dentition poor. Lungs: Bilateral rales/ rhonchi, right >  left Heart:Heart irregular rate and rhythm with S1, S2; no murmur, ? S3. pulses are 2+ & equal all 4 extrem.   Neck: No carotid bruit. No lymphadenopathy.  JVD not well seen secondary to body habitus but not obviously up. Abdomen: Bowel sounds present, abdomen firm and non-tender without masses or hernias noted. Msk:  No spine or cva tenderness. Normal strength and tone for age, no joint deformities or effusions. Extremities: No clubbing or cyanosis. 1+ edema.  Neuro: Alert and oriented X 3. No focal deficits noted. Psych:  Good affect, responds appropriately Skin: No rashes or lesions noted. Chronic stasis changes to both LE.  Labs:   Lab Results  Component Value Date   WBC 5.4 03/27/2011   HGB 14.6 03/27/2011   HCT 46.6 03/27/2011   MCV 86.9 03/27/2011   PLT 195 03/27/2011    Basename 03/26/11 2015  INR 1.08    Lab 03/27/11 0555  NA 134*  K 4.4  CL 95*  CO2 33*  BUN 17  CREATININE 0.90  CALCIUM 8.6  PROT 6.9  BILITOT 0.6  ALKPHOS 65  ALT 71*  AST 53*  GLUCOSE 97    Basename 03/27/11 0555 03/26/11 2348  CKTOTAL 822* 773*  CKMB 7.5* 6.5*  Index 0.9 0.8  TROPONINI <0.30 <0.30   Pro B Natriuretic peptide (BNP)  Date/Time Value Range Status  03/26/2011  7:45 PM 714.8* 0-125 (pg/mL) Final    Echo: pending  Radiology:  X-ray Chest Pa And Lateral  03/27/2011  *RADIOLOGY REPORT*  Clinical Data: Cough and shortness of breath.  CHEST - 2 VIEW  Comparison: 03/26/2011  Findings: Midline trachea.  Mild cardiomegaly. Mediastinal contours otherwise within normal limits.  Mild right hemidiaphragm elevation. No pleural effusion or pneumothorax.  Diffuse peribronchial thickening.  Improved right base aeration with mild airspace disease remaining.  Left base atelectasis as well.  IMPRESSION:  1.  Improved right base aeration.  Probable atelectasis remaining. 2. Cardiomegaly without congestive failure. 3. Peribronchial thickening which may relate to chronic bronchitis or smoking.   Original Report Authenticated By: Consuello Bossier, M.D.   Dg Chest 2 View 03/26/2011  *RADIOLOGY REPORT*  Clinical Data: Respiratory distress, questionable fever, cough  CHEST - 2 VIEW  Comparison: None.  Findings: Enlarged cardiac silhouette and mediastinal contours. There is fullness of bilateral pulmonary hila.  Mild pulmonary venous congestion without frank evidence of pulmonary edema.  Right basilar airspace opacities.  Bibasilar heterogeneous opacities.  No definite pleural effusion or pneumothorax.  Mild age indeterminate anterior compression deformity of a mid thoracic vertebral body.  IMPRESSION:  1.  Right lower lung airspace opacities worrisome for developing infection.  A follow-up chest radiograph in 4 to 6 weeks after treatment is recommended to ensure resolution. 2.  Borderline enlarged cardiac silhouette and mediastinal contours with fullness of the bilateral pulmonary hila, nonspecific but may be seen in the setting of pulmonary arterial hypertension.  Further evaluation with cardiac echo may be obtained as clinically indicated.  Original Report Authenticated By: Waynard Reeds, M.D.    EKG:  Atrial fibrillation, rate 82 Incomplete right bundle branch block Cannot rule out Anterior infarct , age undetermined Abnormal ECG  ASSESSMENT AND PLAN:   The patient was seen today by Dr [name], the patient evaluated and the data reviewed.  Present on Admission:  .Atrial fibrillation with rapid ventricular response - unknown duration, rate improved with IV cardizem, OK to change to PO since HR improved and BP OK. Not sure that we should try to get in SR right now since no symptoms are clearly attributed to the a fib.  .Obesity, Class III, BMI 40-49.9 (morbid obesity) - Per primary MD.  .URI, acute - per primary MD.  Anticoagulation - will need - heparin for now, coumadin/Pradaxa per MD but cost may be a factor.  ?OSA - consider referral for OP sleep study; could also do overnight oximetry  and see if can get CPAP at d/c based on those results.  ?Volume overload - BNP up slightly, EF pending by echo, follow I/O, may need diuresis but in the setting of URI, difficult to say.    Signed: Theodore Demark 03/27/2011, 9:14 AM Co-Sign MD  Patient seen with PA, agree with note.  Patient with new-onset atrial fibrillation/RVR in setting of probable PNA.  I also suspect that the patient has diastolic CHF and OSA.  I reviewed the echo as it was being done.  LV EF was normal but the RV was moderately dilated.  1. Atrial fibrillation: New-onset, likely triggered by the combination of respiratory infection (PNA versus bronchitis) and OSA.  Rate is now controlled on diltiazem gtt.  - Will stop dilt gtt and give diltiazem CD 360 mg daily.   - Continue heparin gtt, start coumadin (not sure he would have insurance coverage for Pradaxa or Xarelto).   - Will plan cardioversion.  Lungs currently sound very rhonchorous and oxygen saturation low, so will avoid TEE at this time.  I think the primary cause of his acute dyspnea is probably lung infection rather than CHF from atrial fibrillation.  If he stabilizes with treatment of PNA and some diuresis, can anticoagulate for 1 month and then cardiovert.  Alternative would be TEE-guided cardioversion while in the hospital but would like to see respiratory status a bit better before that, especially given a likely difficult airway.  2. CHF: Suspect diastolic CHF with some volume overload.  I do not think that this is the main cause of his respiratory compromise (suspect PNA is main culprit).  However, he does merit some diuresis.  LV EF normal on echo but RV is dilated.  This may be due to pulmonary HTN from OSA.  Will reassess echo when it is in the system to see if PA pressure can be estimated.  - Start Lasix 40 mg IV bid with KCl.   3. OSA: Strong suspicion for OSA.  Very thick neck, gasps and stops breathing at night.  Dilated RV.  Will need sleep study.  Will  start CPAP in the hospital (strong risk factor for atrial fibrillation).  Marca Ancona 03/27/2011 10:37 AM

## 2011-03-27 NOTE — Progress Notes (Signed)
CRITICAL VALUE ALERT  Critical value received:  MB 6.5   Date of notification:  03/27/11  Time of notification:  0210  Critical value read back:yes  Nurse who received alert:  K. Thad Ranger RN  MD notified (1st page):  FPTS  Time of first page:  0216  MD notified (2nd page):  Time of second page:  Responding MD:  FPTS  Time MD responded:  [id number]

## 2011-03-27 NOTE — Progress Notes (Signed)
Discussed in rounds and agree with Dr. Algis Downs management.  I still have questions about how much of hypoxia is CHF versus pneumonia.  Will continue to treat for both.

## 2011-03-27 NOTE — H&P (Signed)
FMTS Attending Admit Note  Patient seen and examined by me, discussed with resident team.  Briefly, a 56 yo M with c/o one week cough/congestion and then onset of fatigue with mild exertion (2/09); denies fevers/chills, chest pain/pressure or diaphoresis/nausea, denies feeling of palpitations or presyncope.  In ED yesterday found on CXR to have pulm infiltrates as well as pulm edema, cardiomegaly; noted to go into AF with RVR while in ED, patient states he did not feel change in symptoms at the time of the AF with RVR.  Has been managed on Diltiazem gtt since, with HR in the 90-100 range in AF. Receiving Lasix for diuresis.  Also treated with ceftriaxone in ED for pneumonia. This morning he reports feeling mildly improved, continues to deny palpitations, chest pressure/pain, but feels chest congestion and cough.  He does not appear in distress; POx 90-92% on 3-4L/min nasal cannula oxygen  Assess/Plan: 56 yo M with newly diagnosed AF of indeterminate onset, given absence of clear symptoms when observed to go into AF in ED.  My construction of the history is that patient's pneumonia and relative hypoxia may have created a demand ischemia, which in turn triggered his AF and pulmonary edema/HF.  Thus far he has had 2 sets of cardiac enzymes with elevated CKMB, but negative Troponin I's.   Plan: AF: Would start heparin gtt now.  Continue rate control on dilt gtt.  Oxygen supplementation, cycling enzymes and repeat ECG this morning.  We are awaiting TSH, A1c. The A1C in particular could alter his CHADS2 score.  To contact cardiology now, as pulmonary edema is an indication to consider cardioversion now.  We plan to get a 2D ECHO this morning, however cardiology may help [place] consider whether a TEE is preferable if cardioversion is indicated now.   Pneumonia: Would continue to treat with Ceftriaxone, azithromycin for CAP.  Supplemental O2 as needed.  Nebs, consider Xopenex in lieu of albuterol given RVR.  Risk  stratification with A1C, lipid panel.  Paula Compton, MD

## 2011-03-28 ENCOUNTER — Encounter (HOSPITAL_COMMUNITY): Payer: Self-pay | Admitting: *Deleted

## 2011-03-28 LAB — BASIC METABOLIC PANEL
Calcium: 9.1 mg/dL (ref 8.4–10.5)
GFR calc non Af Amer: >90 mL/min (ref >90)
Glucose, Bld: 87 mg/dL (ref 70–99)
Potassium: 4.3 mEq/L (ref 3.5–5.1)
Sodium: 135 mEq/L (ref 135–145)

## 2011-03-28 LAB — PROTIME-INR
INR: 1.05 (ref 0.00–1.49)
Prothrombin Time: 13.9 seconds (ref 11.6–15.2)

## 2011-03-28 LAB — CBC
MCHC: 31.8 g/dL (ref 30.0–36.0)
Platelets: 179 10*3/uL (ref 150–400)
RDW: 14 % (ref 11.5–15.5)
WBC: 6.9 10*3/uL (ref 4.0–10.5)

## 2011-03-28 LAB — HEPARIN LEVEL (UNFRACTIONATED): Heparin Unfractionated: 0.28 IU/mL — ABNORMAL LOW (ref 0.30–0.70)

## 2011-03-28 MED ORDER — PANTOPRAZOLE SODIUM 40 MG PO TBEC
40.0000 mg | DELAYED_RELEASE_TABLET | Freq: Every day | ORAL | Status: DC
  Administered 2011-03-28 – 2011-04-04 (×6): 40 mg via ORAL
  Filled 2011-03-28 (×6): qty 1

## 2011-03-28 MED ORDER — WARFARIN SODIUM 2.5 MG PO TABS
12.5000 mg | ORAL_TABLET | Freq: Once | ORAL | Status: AC
  Administered 2011-03-28: 12.5 mg via ORAL
  Filled 2011-03-28: qty 1

## 2011-03-28 MED ORDER — IPRATROPIUM BROMIDE 0.02 % IN SOLN
0.5000 mg | Freq: Four times a day (QID) | RESPIRATORY_TRACT | Status: DC
  Administered 2011-03-28 – 2011-03-31 (×13): 0.5 mg via RESPIRATORY_TRACT
  Filled 2011-03-28 (×13): qty 2.5

## 2011-03-28 MED ORDER — IPRATROPIUM BROMIDE 0.02 % IN SOLN: 0.5000 mg | RESPIRATORY_TRACT | Status: DC | PRN

## 2011-03-28 MED ORDER — DIGOXIN 125 MCG PO TABS
0.1250 mg | ORAL_TABLET | Freq: Every day | ORAL | Status: DC
  Administered 2011-03-28 – 2011-03-31 (×4): 0.125 mg via ORAL
  Filled 2011-03-28 (×4): qty 1

## 2011-03-28 NOTE — Progress Notes (Signed)
Noted order for medication assistance, pt is eligible for 3 days or $100 of meds at time of d/c. Coumadin is a $4 drug so using this benefit for this would not be advisible however will assist as needed, of more concern is blood draws and monitoring of Coumadin as pt has no PCP.  Johny Shock RN MPH Case Manager [phone]

## 2011-03-28 NOTE — Progress Notes (Signed)
Patient ID: Darius [name], male   DOB: September 15, 1955, 56 y.o.   MRN: [id number]    SUBJECTIVE: Good diuresis.  He is still short of breath and wheezing.  He has been started on prednisone for reactive airways.  HR mildly elevated today, 100s atrial fibrillation.      Marland Kitchen aspirin EC  81 mg Oral Daily  . azithromycin  250 mg Oral Daily  . cefTRIAXone (ROCEPHIN)  IV  1 g Intravenous Q24H  . diltiazem  360 mg Oral Daily  . furosemide  40 mg Intravenous BID  . heparin  4,000 Units Intravenous Once  . mulitivitamin with minerals  1 tablet Oral Daily  . patient's guide to using coumadin book   Does not apply Once  . potassium chloride  20 mEq Oral BID  . predniSONE  50 mg Oral Q breakfast  . sodium chloride  3 mL Intravenous Q12H  . warfarin  10 mg Oral ONCE-1800  . warfarin   Does not apply Once  . DISCONTD: furosemide  40 mg Oral Daily  . DISCONTD: ipratropium  0.5 mg Nebulization Q4H      Filed Vitals:   03/28/11 0407 03/28/11 0500 03/28/11 0600 03/28/11 0749  BP:    115/62  Pulse:  83 97 102  Temp: 97.8 F (36.6 C)   97.5 F (36.4 C)  TempSrc: Oral   Oral  Resp:  20 17 20   Height:      Weight:  149.7 kg (330 lb 0.5 oz)    SpO2:  86% 95% 93%    Intake/Output Summary (Last 24 hours) at 03/28/11 1016 Last data filed at 03/28/11 0700  Gross per 24 hour  Intake 1192.47 ml  Output   3675 ml  Net -2482.53 ml    LABS: Basic Metabolic Panel:  Basename 03/28/11 0500 03/27/11 0555  NA 135 134*  K 4.3 4.4  CL 95* 95*  CO2 35* 33*  GLUCOSE 87 97  BUN 13 17  CREATININE 0.69 0.90  CALCIUM 9.1 8.6  MG -- --  PHOS -- --   Liver Function Tests:  [facility] 03/27/11 0555  AST 53*  ALT 71*  ALKPHOS 65  BILITOT 0.6  PROT 6.9  ALBUMIN 3.4*   No results found for this basename: LIPASE:2,AMYLASE:2 in the last 72 hours CBC:  Basename 03/28/11 0500 03/27/11 0555 03/26/11 1513  WBC 6.9 5.4 --  NEUTROABS -- -- 5.9  HGB 16.0 14.6 --  HCT 50.3 46.6 --  MCV 87.9 86.9 --    PLT 179 195 --   Cardiac Enzymes:  Basename 03/27/11 0555 03/26/11 2348  CKTOTAL 822* 773*  CKMB 7.5* 6.5*  CKMBINDEX -- --  TROPONINI <0.30 <0.30   BNP: No components found with this basename: POCBNP:3 D-Dimer: No results found for this basename: DDIMER:2 in the last 72 hours Hemoglobin A1C:  Basename 03/27/11 0555  HGBA1C 6.8*   Fasting Lipid Panel: No results found for this basename: CHOL,HDL,LDLCALC,TRIG,CHOLHDL,LDLDIRECT in the last 72 hours Thyroid Function Tests:  Basename 03/26/11 2348  TSH 0.641  T4TOTAL --  T3FREE --  THYROIDAB --   Anemia Panel: No results found for this basename: VITAMINB12,FOLATE,FERRITIN,TIBC,IRON,RETICCTPCT in the last 72 hours  RADIOLOGY: X-ray Chest Pa And Lateral   03/27/2011  *RADIOLOGY REPORT*  Clinical Data: Cough and shortness of breath.  CHEST - 2 VIEW  Comparison: 03/26/2011  Findings: Midline trachea.  Mild cardiomegaly. Mediastinal contours otherwise within normal limits.  Mild right hemidiaphragm elevation. No pleural effusion or pneumothorax.  Diffuse peribronchial thickening.  Improved right base aeration with mild airspace disease remaining.  Left base atelectasis as well.  IMPRESSION:  1.  Improved right base aeration.  Probable atelectasis remaining. 2. Cardiomegaly without congestive failure. 3. Peribronchial thickening which may relate to chronic bronchitis or smoking.  Original Report Authenticated By: Consuello Bossier, M.D.   Dg Chest 2 View  03/26/2011  *RADIOLOGY REPORT*  Clinical Data: Respiratory distress, questionable fever, cough  CHEST - 2 VIEW  Comparison: None.  Findings: Enlarged cardiac silhouette and mediastinal contours. There is fullness of bilateral pulmonary hila.  Mild pulmonary venous congestion without frank evidence of pulmonary edema.  Right basilar airspace opacities.  Bibasilar heterogeneous opacities.  No definite pleural effusion or pneumothorax.  Mild age indeterminate anterior compression deformity of  a mid thoracic vertebral body.  IMPRESSION:  1.  Right lower lung airspace opacities worrisome for developing infection.  A follow-up chest radiograph in 4 to 6 weeks after treatment is recommended to ensure resolution. 2.  Borderline enlarged cardiac silhouette and mediastinal contours with fullness of the bilateral pulmonary hila, nonspecific but may be seen in the setting of pulmonary arterial hypertension.  Further evaluation with cardiac echo may be obtained as clinically indicated.  Original Report Authenticated By: Waynard Reeds, M.D.    PHYSICAL EXAM General: NAD, obese Neck: Thick, JVP 8-9 cm, no thyromegaly or thyroid nodule.  Lungs: Diffuse rhonchi and wheezes bilaterally.  CV: Nondisplaced PMI.  Heart regular S1/S2, no S3/S4, no murmur.  1+ edema 1/3 to knees bilaterally.  No carotid bruit.  Normal pedal pulses.  Abdomen: Soft, nontender, no hepatosplenomegaly, no distention.  Neurologic: Alert and oriented x 3.  Psych: Normal affect. Extremities: No clubbing or cyanosis.   TELEMETRY: Reviewed telemetry pt in atrial fibrillation, HR in 90s-100s.   ASSESSMENT AND PLAN:  Patient with new-onset atrial fibrillation/RVR in setting of acute bronchitis with asthma exacerbation. I also suspect that the patient has diastolic CHF and OSA.  1. Atrial fibrillation: New-onset, likely triggered by the combination of respiratory infection (PNA versus bronchitis) and OSA.  - Continue diltiazem CD - Add digoxin for rate control (avoiding beta blocker with wheezing).  - Continue heparin gtt, started coumadin (not sure he would have insurance coverage for Pradaxa or Xarelto).  - Will plan cardioversion. Lungs currently sound very rhonchorous and oxygen saturation low, so will avoid TEE at this time. I think the primary cause of his acute dyspnea is probably lung infection rather than CHF from atrial fibrillation. If he stabilizes with treatment of PNA and some diuresis, can anticoagulate for 1 month  and then cardiovert. Alternative would be TEE-guided cardioversion while in the hospital but would like to see respiratory status a bit better before that, especially given a likely difficult airway.  2. CHF: Suspect diastolic CHF with some volume overload. I do not think that this is the main cause of his respiratory compromise (suspect acute bronchitis with asthma exacerbation is the main culprit). However, he does merit some diuresis. LV EF normal on echo but RV is dilated. This may be due to pulmonary HTN from OSA.  The echo did not show elevation in PA pressure by doppler interrogation - Continue Lasix 40 mg IV bid with KCl.  3. OSA: Strong suspicion for OSA. Very thick neck, gasps and stops breathing at night. Dilated RV. Will need sleep study. Started CPAP in the hospital (strong risk factor for atrial fibrillation). 4. Pulmonary: Suspect acute bronchitis with reactive airways disease (asthma  exacerbation, never smoked).  Added prednisone.  Will start Atrovent nebs.    Marca Ancona 03/28/2011 10:22 AM

## 2011-03-28 NOTE — Progress Notes (Signed)
Seen and examined.  Agree with Dr. Mikel Cella.  Lungs, no wheeze now - only prolonged exp phase. Still with Afib, rate largely controled.  Still with peripheral edema  We are on the right track: Questions remaining 1. How much of his pulmonary disease is acute, infectious versus chronic.  Non smoker.  Body habitus is that of restrictive lung disease and likely sleep apnea.  RV dilation suggests significant chronic disease.   2. Does he have significant CAD?  The segmental wall abnormalities suggests that CAD may be contributing to his symptom complex.    I anticipate another couple of days in the hospital to sort through these issues and get him on a stable med regimen.

## 2011-03-28 NOTE — Progress Notes (Signed)
ANTICOAGULATION CONSULT NOTE - Follow Up Consult  Pharmacy Consult for warfarin/heparin Indication: afib  No Known Allergies  Patient Measurements: Height: 6\' 2"  (188 cm) Weight: 330 lb 0.5 oz (149.7 kg) IBW/kg (Calculated) : 82.2    Vital Signs: Temp: 97.5 F (36.4 C) (02/15 0749) Temp src: Oral (02/15 0749) BP: 115/62 mmHg (02/15 0749) Pulse Rate: 102  (02/15 0749)  Labs:  Basename 03/28/11 0500 03/27/11 2227 03/27/11 1334 03/27/11 0555 03/26/11 2348 03/26/11 2015  HGB 16.0 -- -- 14.6 -- --  HCT 50.3 -- -- 46.6 48.0 --  PLT 179 -- -- 195 199 --  APTT -- -- -- -- -- --  LABPROT 13.9 -- -- -- -- 14.2  INR 1.05 -- -- -- -- 1.08  HEPARINUNFRC 0.28* 0.28* <0.10* -- -- --  CREATININE 0.69 -- -- 0.90 0.85 --  CKTOTAL -- -- -- 822* 773* --  CKMB -- -- -- 7.5* 6.5* --  TROPONINI -- -- -- <0.30 <0.30 --   Estimated Creatinine Clearance: 157.4 ml/min (by C-G formula based on Cr of 0.69).   Medications:  Infusions:    . sodium chloride 10 mL/hr at 03/27/11 0000  . heparin 2,400 Units/hr (03/27/11 2346)    Assessment: 56 year old male with new onset afib. Heparin level still slightly below goal at 0.28, INR relatively unchanged at 1.05. No bleeding issues noted. Will adjust heparin rate.  Goal of Therapy:  INR 2-3 Heparin level 0.3-0.7 units/ml   Plan:  Increase IV heparin to 2600 units/hour Warfarin 12.5 mg today INR/HL in am. Darius [name] 03/28/2011,10:30 AM

## 2011-03-28 NOTE — Progress Notes (Signed)
PGY-1 Daily Progress Note Family Medicine Teaching Service Mickie Kozikowski M. Jayci Ellefson, MD Service Pager: [phone]   Subjective: Patient had episode of confusion yesterday which resolved on its own. CIWA ordered, but patient most likely does not need this. Patient states he feels better overall but he continues to have a cough. He tolerated cpap overnight with no difficulty. Rate is 80's-120's. Started on Coumadin yesterday; no s/sx of bleeding.  Objective: Vital signs in last 24 hours: Temp:  [97.3 F (36.3 C)-98 F (36.7 C)] 97.8 F (36.6 C) (02/15 0407) Pulse Rate:  [70-115] 97  (02/15 0600) Resp:  [14-28] 17  (02/15 0600) BP: (94-151)/(64-89) 117/70 mmHg (02/15 0400) SpO2:  [86 %-97 %] 95 % (02/15 0600) Weight:  [330 lb 0.5 oz (149.7 kg)] 330 lb 0.5 oz (149.7 kg) (02/15 0500) Weight change: -3 lb 4.9 oz (-1.5 kg) Last BM Date: 03/27/11  Intake/Output from previous day: 02/14 0701 - 02/15 0700 In: 1285.2 [P.O.:480; I.V.:755.2; IV Piggyback:50] Out: 3675 [Urine:3675] Intake/Output this shift:    Physical Exam: General appearance: alert, cooperative and no distress. Sitting in bed eating breakfast Head: Normocephalic, without obvious abnormality, atraumatic. [place] in place Resp: Good effort, diffuse wheezes bilaterally. No crackles Cardio: irregularly irregular rhythm, regular rate. Distant heart sounds GI: soft, non-tender; bowel sounds normal Extremities: extremities normal, atraumatic, no pitting edema Neurologic: Grossly normal  Lab Results:  Basename 03/28/11 0500 03/27/11 0555  WBC 6.9 5.4  HGB 16.0 14.6  HCT 50.3 46.6  PLT 179 195   BMET  Basename 03/28/11 0500 03/27/11 0555  NA 135 134*  K 4.3 4.4  CL 95* 95*  CO2 35* 33*  GLUCOSE 87 97  BUN 13 17  CREATININE 0.69 0.90  CALCIUM 9.1 8.6    Studies/Results: X-ray Chest Pa And Lateral   03/27/2011  *RADIOLOGY REPORT*  Clinical Data: Cough and shortness of breath.  CHEST - 2 VIEW  Comparison: 03/26/2011   Findings: Midline trachea.  Mild cardiomegaly. Mediastinal contours otherwise within normal limits.  Mild right hemidiaphragm elevation. No pleural effusion or pneumothorax.  Diffuse peribronchial thickening.  Improved right base aeration with mild airspace disease remaining.  Left base atelectasis as well.  IMPRESSION:  1.  Improved right base aeration.  Probable atelectasis remaining. 2. Cardiomegaly without congestive failure. 3. Peribronchial thickening which may relate to chronic bronchitis or smoking.  Original Report Authenticated By: Consuello Bossier, M.D.   Dg Chest 2 View  03/26/2011  *RADIOLOGY REPORT*  Clinical Data: Respiratory distress, questionable fever, cough  CHEST - 2 VIEW  Comparison: None.  Findings: Enlarged cardiac silhouette and mediastinal contours. There is fullness of bilateral pulmonary hila.  Mild pulmonary venous congestion without frank evidence of pulmonary edema.  Right basilar airspace opacities.  Bibasilar heterogeneous opacities.  No definite pleural effusion or pneumothorax.  Mild age indeterminate anterior compression deformity of a mid thoracic vertebral body.  IMPRESSION:  1.  Right lower lung airspace opacities worrisome for developing infection.  A follow-up chest radiograph in 4 to 6 weeks after treatment is recommended to ensure resolution. 2.  Borderline enlarged cardiac silhouette and mediastinal contours with fullness of the bilateral pulmonary hila, nonspecific but may be seen in the setting of pulmonary arterial hypertension.  Further evaluation with cardiac echo may be obtained as clinically indicated.  Original Report Authenticated By: Waynard Reeds, M.D.    Medications:  I have reviewed the patient's current medications. Scheduled:    . aspirin EC  81 mg Oral Daily  .  azithromycin  500 mg Oral Daily   Followed by  . azithromycin  250 mg Oral Daily  . cefTRIAXone (ROCEPHIN)  IV  1 g Intravenous Q24H  . diltiazem  360 mg Oral Daily  . furosemide  40  mg Intravenous BID  . heparin  4,000 Units Intravenous Once  . heparin  4,000 Units Intravenous Once  . ipratropium  0.5 mg Nebulization Q4H  . mulitivitamin with minerals  1 tablet Oral Daily  . patient's guide to using coumadin book   Does not apply Once  . pneumococcal 23 valent vaccine  0.5 mL Intramuscular Tomorrow-1000  . potassium chloride  20 mEq Oral BID  . predniSONE  50 mg Oral Q breakfast  . sodium chloride  3 mL Intravenous Q12H  . warfarin  10 mg Oral ONCE-1800  . warfarin   Does not apply Once  . DISCONTD: furosemide  40 mg Oral Daily  . DISCONTD: heparin  5,000 Units Subcutaneous Q8H   Continuous:    . sodium chloride 10 mL/hr at 03/27/11 0000  . heparin 2,400 Units/hr (03/27/11 2346)  . DISCONTD: diltiazem (CARDIZEM) infusion Stopped (03/27/11 1027)   NWG:[id number], acetaminophen, albuterol, guaiFENesin-codeine, LORazepam, LORazepam  Assessment/Plan: 56 yo M with no significant PMH admitted for new onset atrial fibrillation   1. Atrial Fibrillation- New onset A fib. Patient asymptomatic; denies CP or palpitations. Started on Diltiazem drip in the ED, but was not rate controlled at time of admission therefore the Dilt trip was titrated and continued overnight. Pulse rate in the 80's yesterday and transitioned to PO Diltiazem 360mg  yesterday. Patient tolerating well.   - HR remains >100 this morning. (Could be because patient was eating, talking and coughing.) - Started on Heparin drip to bridge to Coumadin. Patient states understanding of this - Continue to monitor closely on Telemetry. Patient could likely be transferred to floor today.  - Will continue ASA 81 mg. - Cardiac enzymes showed negative trop, elevated CK to 822, CKMB 7.5 - Followed by Cardiology, we appreciate their consult on this patient. Will most likely cardiovert within the next month when he has recovered from respiratory illness and is adequately anticoagulated.  2. ?CHF- BNP 714. Patient  states he has had some edema, as well as shortness of breath and cough. No history of heart failure.  - 2D echo shows EF of 50-55% - IVF to [facility] - Lasix 40mg  IV x2 doses per cards - Strict I/O and daily standing weights   3. Hypoxia- Patient with nonproductive cough and wheezing. Sating in the low 90's on 3L O2 in the ED. No signs of pneumonia on CXR at admission. WBC within normal limit. Afebrile. Repeat 2 view CXR showed improved aeration, cardiomegaly without congestive failure and peribronchial thickening related to chronic bronchitis vs.smoking. Patient now with hoarse voice. - Will continue Atrovent nebs q4 hours, and Albuterol q2 prn for wheezing  - O2 via [place] to maintain sats >90%.  - CPap overnight - WBC 6.9 this morning. - Continue CTX and Azithro for ?COPD exacerbation - Start Prednisone 50mg  today - Continue cough syrup with codeine as needed for coughing spells. (This is most likely why he was positive for Opiates on tox screen.) - Will give Cepacol drops prn   4. FEN/GI- Heart healthy diet. Will add Miralax prn for mild constipation. 5. PPx- Heparin drip with transition to Coumadin.  6. Dispo- Pending further clinical improvement as well as anticoagulation. Patient will likely be transferred to telemetry floor later today.  LOS: 2 days   Kenidi Elenbaas 03/28/2011, 7:30 AM

## 2011-03-28 NOTE — Progress Notes (Signed)
Pt is a smoker who says he only smokes once in a while. No trigger for whenever he does smoke but doesn't consider himself a smoker. Discussed there not being any safe level of exposure to cigarette smoke. Pt verbalizes understanding. Reviewed and gave pt education/contact information.

## 2011-03-28 NOTE — Progress Notes (Signed)
Clinical Child psychotherapist (CSW) received an inapropriate referral for medication assistance. CSW informed MD, referral to be for [facility]. CSW is signing off.  Theresia Bough, MSW, Theresia Majors [phone]

## 2011-03-29 LAB — HEPARIN LEVEL (UNFRACTIONATED)
Heparin Unfractionated: 0.92 IU/mL — ABNORMAL HIGH (ref 0.30–0.70)
Heparin Unfractionated: 0.83 IU/mL — ABNORMAL HIGH (ref 0.30–0.70)

## 2011-03-29 MED ORDER — WARFARIN SODIUM 10 MG PO TABS
10.0000 mg | ORAL_TABLET | Freq: Once | ORAL | Status: AC
  Administered 2011-03-29: 10 mg via ORAL
  Filled 2011-03-29 (×2): qty 1

## 2011-03-29 MED ORDER — HEPARIN SOD (PORCINE) IN D5W 100 UNIT/ML IV SOLN
2250.0000 [IU]/h | INTRAVENOUS | Status: DC
  Administered 2011-03-29: 2250 [IU]/h via INTRAVENOUS
  Filled 2011-03-29 (×4): qty 250

## 2011-03-29 MED ORDER — MENTHOL 3 MG MT LOZG
1.0000 | LOZENGE | OROMUCOSAL | Status: DC | PRN
  Filled 2011-03-29: qty 9

## 2011-03-29 NOTE — Progress Notes (Signed)
ANTICOAGULATION CONSULT NOTE - Follow Up Consult  Pharmacy Consult for heparin Indication: afib  Assessment: 56 year old male with new onset afib. Heparin level therapeutic at 0.6 on 2250 units/hr. INR starting to trend up at 1.29 this AM (from 1.05 yesterday). No issues with heparin line or bleeding noted.  Goal of Therapy:  INR 2-3 Heparin level 0.3-0.7 units/ml   Plan:  Continue IV heparin at 2250 units/hr Check AM heparin level and CBC ------ Darius [name], Pharm D 03/29/2011 10:14 PM    No Known Allergies  Patient Measurements: Height: 6\' 2"  (188 cm) Weight: 321 lb 10.4 oz (145.9 kg) IBW/kg (Calculated) : 82.2    Vital Signs: Temp: 98.2 F (36.8 C) (02/16 1334) Temp src: Oral (02/16 1114) BP: 128/83 mmHg (02/16 1334) Pulse Rate: 90  (02/16 1334)  Labs:  Basename 03/29/11 2121 03/29/11 1354 03/29/11 0600 03/28/11 0500 03/27/11 0555 03/26/11 2348  HGB -- -- -- 16.0 14.6 --  HCT -- -- -- 50.3 46.6 48.0  PLT -- -- -- 179 195 199  APTT -- -- -- -- -- --  LABPROT -- -- 16.3* 13.9 -- --  INR -- -- 1.29 1.05 -- --  HEPARINUNFRC 0.60 0.83* 0.92* -- -- --  CREATININE -- -- -- 0.69 0.90 0.85  CKTOTAL -- -- -- -- 822* 773*  CKMB -- -- -- -- 7.5* 6.5*  TROPONINI -- -- -- -- <0.30 <0.30   Estimated Creatinine Clearance: 155.2 ml/min (by C-G formula based on Cr of 0.69).   Medications:  Infusions:     . sodium chloride 10 mL/hr at 03/29/11 1000  . heparin 2,250 Units/hr (03/29/11 1819)  . DISCONTD: heparin 2,450 Units/hr (03/29/11 1000)

## 2011-03-29 NOTE — Progress Notes (Signed)
ANTICOAGULATION CONSULT NOTE - Follow Up Consult  Pharmacy Consult for warfarin/heparin Indication: afib  Assessment: 56 year old male with new onset afib. Heparin level still above goal at 0.83. INR starting to trend up at 1.29 this AM (from 1.05 yesterday). No issues with heparin line or bleeding noted. Will adjust heparin rate.  Goal of Therapy:  INR 2-3 Heparin level 0.3-0.7 units/ml   Plan:  Decrease IV heparin to 2250 units/hr Check 6-hr heparin level  ------  No Known Allergies  Patient Measurements: Height: 6\' 2"  (188 cm) Weight: 321 lb 10.4 oz (145.9 kg) IBW/kg (Calculated) : 82.2    Vital Signs: Temp: 98.2 F (36.8 C) (02/16 1334) Temp src: Oral (02/16 1114) BP: 128/83 mmHg (02/16 1334) Pulse Rate: 90  (02/16 1334)  Labs:  Basename 03/29/11 1354 03/29/11 0600 03/28/11 0500 03/27/11 0555 03/26/11 2348 03/26/11 2015  HGB -- -- 16.0 14.6 -- --  HCT -- -- 50.3 46.6 48.0 --  PLT -- -- 179 195 199 --  APTT -- -- -- -- -- --  LABPROT -- 16.3* 13.9 -- -- 14.2  INR -- 1.29 1.05 -- -- 1.08  HEPARINUNFRC 0.83* 0.92* 0.28* -- -- --  CREATININE -- -- 0.69 0.90 0.85 --  CKTOTAL -- -- -- 822* 773* --  CKMB -- -- -- 7.5* 6.5* --  TROPONINI -- -- -- <0.30 <0.30 --   Estimated Creatinine Clearance: 155.2 ml/min (by C-G formula based on Cr of 0.69).   Medications:  Infusions:     . sodium chloride 10 mL/hr at 03/29/11 1000  . heparin 2,450 Units/hr (03/29/11 1000)

## 2011-03-29 NOTE — Progress Notes (Signed)
Patient ID: Darius [name], male   DOB: Aug 31, 1955, 56 y.o.   MRN: [id number]     SUBJECTIVE: Good diuresis again, weight down considerably.  Breathing is better.  Still in atrial fibrillation with HR in the 90s-100s.     Marland Kitchen aspirin EC  81 mg Oral Daily  . azithromycin  250 mg Oral Daily  . digoxin  0.125 mg Oral Daily  . diltiazem  360 mg Oral Daily  . furosemide  40 mg Intravenous BID  . ipratropium  0.5 mg Nebulization Q6H  . mulitivitamin with minerals  1 tablet Oral Daily  . pantoprazole  40 mg Oral Q1200  . potassium chloride  20 mEq Oral BID  . predniSONE  50 mg Oral Q breakfast  . sodium chloride  3 mL Intravenous Q12H  . warfarin  10 mg Oral ONCE-1800  . warfarin  12.5 mg Oral ONCE-1800  . warfarin   Does not apply Once  . DISCONTD: cefTRIAXone (ROCEPHIN)  IV  1 g Intravenous Q24H  . DISCONTD: ipratropium  0.5 mg Nebulization Q4H  heparin gtt    Filed Vitals:   03/29/11 0226 03/29/11 0400 03/29/11 0600 03/29/11 0745  BP:  113/80    Pulse: 89     Temp:  97 F (36.1 C)  97.4 F (36.3 C)  TempSrc:  Axillary  Oral  Resp: 20     Height:      Weight:   145.9 kg (321 lb 10.4 oz)   SpO2: 94%       Intake/Output Summary (Last 24 hours) at 03/29/11 0752 Last data filed at 03/29/11 0600  Gross per 24 hour  Intake    720 ml  Output   4100 ml  Net  -3380 ml    LABS: Basic Metabolic Panel:  Basename 03/28/11 0500 03/27/11 0555  NA 135 134*  K 4.3 4.4  CL 95* 95*  CO2 35* 33*  GLUCOSE 87 97  BUN 13 17  CREATININE 0.69 0.90  CALCIUM 9.1 8.6  MG -- --  PHOS -- --   Liver Function Tests:  [facility] 03/27/11 0555  AST 53*  ALT 71*  ALKPHOS 65  BILITOT 0.6  PROT 6.9  ALBUMIN 3.4*   No results found for this basename: LIPASE:2,AMYLASE:2 in the last 72 hours CBC:  Basename 03/28/11 0500 03/27/11 0555 03/26/11 1513  WBC 6.9 5.4 --  NEUTROABS -- -- 5.9  HGB 16.0 14.6 --  HCT 50.3 46.6 --  MCV 87.9 86.9 --  PLT 179 195 --   Cardiac  Enzymes:  Basename 03/27/11 0555 03/26/11 2348  CKTOTAL 822* 773*  CKMB 7.5* 6.5*  CKMBINDEX -- --  TROPONINI <0.30 <0.30   BNP: No components found with this basename: POCBNP:3 D-Dimer: No results found for this basename: DDIMER:2 in the last 72 hours Hemoglobin A1C:  Basename 03/27/11 0555  HGBA1C 6.8*   Fasting Lipid Panel: No results found for this basename: CHOL,HDL,LDLCALC,TRIG,CHOLHDL,LDLDIRECT in the last 72 hours Thyroid Function Tests:  Basename 03/26/11 2348  TSH 0.641  T4TOTAL --  T3FREE --  THYROIDAB --   Anemia Panel: No results found for this basename: VITAMINB12,FOLATE,FERRITIN,TIBC,IRON,RETICCTPCT in the last 72 hours  RADIOLOGY: X-ray Chest Pa And Lateral   03/27/2011  *RADIOLOGY REPORT*  Clinical Data: Cough and shortness of breath.  CHEST - 2 VIEW  Comparison: 03/26/2011  Findings: Midline trachea.  Mild cardiomegaly. Mediastinal contours otherwise within normal limits.  Mild right hemidiaphragm elevation. No pleural effusion or pneumothorax.  Diffuse peribronchial  thickening.  Improved right base aeration with mild airspace disease remaining.  Left base atelectasis as well.  IMPRESSION:  1.  Improved right base aeration.  Probable atelectasis remaining. 2. Cardiomegaly without congestive failure. 3. Peribronchial thickening which may relate to chronic bronchitis or smoking.  Original Report Authenticated By: Consuello Bossier, M.D.    PHYSICAL EXAM General: NAD, obese Neck: Thick, JVP 8 cm, no thyromegaly or thyroid nodule.  Lungs: Distant breath sounds, wheezing better  CV: Nondisplaced PMI.  Heart regular S1/S2, no S3/S4, no murmur.  1+ ankle edema bilaterally.  No carotid bruit.  Normal pedal pulses.  Abdomen: Soft, nontender, no hepatosplenomegaly, no distention.  Neurologic: Alert and oriented x 3.  Psych: Normal affect. Extremities: No clubbing or cyanosis.   TELEMETRY: Reviewed telemetry pt in atrial fibrillation, HR in 90s-100s.   ASSESSMENT  AND PLAN:  Patient with new-onset atrial fibrillation/RVR in setting of acute bronchitis with asthma exacerbation. I also suspect that the patient has diastolic CHF and OSA.  1. Atrial fibrillation: New-onset, likely triggered by the combination of respiratory infection (PNA versus bronchitis) and OSA.  - Continue diltiazem CD and digoxin (avoiding beta blocker with wheezing).  - Continue heparin gtt, started coumadin (not sure he would have insurance coverage for Pradaxa or Xarelto).  - TEE-guided cardioversion on Monday. 2. CHF: Suspect diastolic CHF with some volume overload. I do not think that this is the main cause of his respiratory compromise (suspect acute bronchitis with asthma exacerbation is the main culprit). However, he does merit some diuresis. LV EF normal on echo but RV is dilated. This may be due to pulmonary HTN from OSA.  The echo did not show elevation in PA pressure by doppler interrogation - Continue Lasix 40 mg IV bid with KCl today, likely transition to po tomorrow.  3. OSA: Strong suspicion for OSA. Very thick neck, gasps and stops breathing at night. Dilated RV. Will need sleep study. Started CPAP in the hospital (strong risk factor for atrial fibrillation). 4. Pulmonary: Suspect acute bronchitis with reactive airways disease (asthma exacerbation, never smoked).  Added prednisone.   5. CAD: Has basal to mid inferior wall motion abnormality.  No chest pain and cardiac enzymes negative at admission.  This needs to be investigated, likely with outpatient myoview.  Do not think that CAD caused current presentation.   Marca Ancona 03/29/2011 7:52 AM

## 2011-03-29 NOTE — Progress Notes (Signed)
Seen and examined.  Discussed with Dr. Mikel Cella and I agree with her management.  Appreciate cards help and note that TEE planned for Monday, 2/18.  We will also plan a CXR in am to reassess pulm status.  Cough is definitely improved.

## 2011-03-29 NOTE — Progress Notes (Signed)
ANTICOAGULATION CONSULT NOTE - Follow Up Consult  Pharmacy Consult for warfarin/heparin Indication: afib  Assessment: 56 year old male with new onset afib. Heparin level above goal this morning at 0.92 while running at 2600 units/hr, INR starting to trend up at 1.29 this AM (from 1.05 yesterday). No issues with heparin line or bleeding noted. Will adjust heparin rate.  Goal of Therapy:  INR 2-3 Heparin level 0.3-0.7 units/ml   Plan:  Decrease IV Heparin rate to 2450 units/hr Coumadin 10mg  x1 today at 1800 Check 6-hr heparin level  F/U daily PT/INR  Benjaman Pott, PharmD     Pager [phone] 03/29/2011   7:46 AM   ------  No Known Allergies  Patient Measurements: Height: 6\' 2"  (188 cm) Weight: 321 lb 10.4 oz (145.9 kg) IBW/kg (Calculated) : 82.2    Vital Signs: Temp: 97.4 F (36.3 C) (02/16 0745) Temp src: Oral (02/16 0745) BP: 113/80 mmHg (02/16 0400) Pulse Rate: 89  (02/16 0226)  Labs:  Basename 03/29/11 0600 03/28/11 0500 03/27/11 2227 03/27/11 0555 03/26/11 2348 03/26/11 2015  HGB -- 16.0 -- 14.6 -- --  HCT -- 50.3 -- 46.6 48.0 --  PLT -- 179 -- 195 199 --  APTT -- -- -- -- -- --  LABPROT 16.3* 13.9 -- -- -- 14.2  INR 1.29 1.05 -- -- -- 1.08  HEPARINUNFRC 0.92* 0.28* 0.28* -- -- --  CREATININE -- 0.69 -- 0.90 0.85 --  CKTOTAL -- -- -- 822* 773* --  CKMB -- -- -- 7.5* 6.5* --  TROPONINI -- -- -- <0.30 <0.30 --   Estimated Creatinine Clearance: 155.2 ml/min (by C-G formula based on Cr of 0.69).   Medications:  Infusions:     . sodium chloride 10 mL/hr at 03/29/11 0600  . heparin 2,600 Units/hr (03/29/11 0600)

## 2011-03-29 NOTE — Progress Notes (Signed)
PGY-1 Daily Progress Note Family Medicine Teaching Service Elva Mauro M. Romain Erion, MD Service Pager: [phone]   Subjective: Patient sleeping this morning with CPAP in place. No acute distress. Awakes easily. Has no complaints this morning. States he feels "good". HR in the 80s and SpO2 91% on the monitor.  Objective: Vital signs in last 24 hours: Temp:  [97 F (36.1 C)-98.2 F (36.8 C)] 97.4 F (36.3 C) (02/16 0745) Pulse Rate:  [72-115] 89  (02/16 0226) Resp:  [20-23] 20  (02/16 0226) BP: (110-139)/(60-97) 113/80 mmHg (02/16 0400) SpO2:  [91 %-97 %] 94 % (02/16 0226) Weight:  [321 lb 10.4 oz (145.9 kg)] 321 lb 10.4 oz (145.9 kg) (02/16 0600) Weight change: -8 lb 6 oz (-3.8 kg) Last BM Date: 03/27/11  Intake/Output from previous day: 02/15 0701 - 02/16 0700 In: 720 [P.O.:360; I.V.:360] Out: 4100 [Urine:4100] Intake/Output this shift:    Physical Exam: General appearance:Sleeping. NAD. Awakes easily and answers questions appropriately Head: Normocephalic, without obvious abnormality, atraumatic. Cpap in place Resp: Prolonged expiratory phase, diffuse wheezes bilaterally. No crackles or rhonci Cardio: irregularly irregular rhythm, regular rate. Distant heart sounds GI: soft, non-tender Extremities: extremities normal, atraumatic, no pitting edema Neurologic: Sleeping  Lab Results:  Basename 03/28/11 0500 03/27/11 0555  WBC 6.9 5.4  HGB 16.0 14.6  HCT 50.3 46.6  PLT 179 195   BMET  Basename 03/28/11 0500 03/27/11 0555  NA 135 134*  K 4.3 4.4  CL 95* 95*  CO2 35* 33*  GLUCOSE 87 97  BUN 13 17  CREATININE 0.69 0.90  CALCIUM 9.1 8.6    Studies/Results: No results found.  Medications:  I have reviewed the patient's current medications. Scheduled:    . aspirin EC  81 mg Oral Daily  . azithromycin  250 mg Oral Daily  . digoxin  0.125 mg Oral Daily  . diltiazem  360 mg Oral Daily  . furosemide  40 mg Intravenous BID  . ipratropium  0.5 mg Nebulization Q6H  .  mulitivitamin with minerals  1 tablet Oral Daily  . pantoprazole  40 mg Oral Q1200  . potassium chloride  20 mEq Oral BID  . predniSONE  50 mg Oral Q breakfast  . sodium chloride  3 mL Intravenous Q12H  . warfarin  10 mg Oral ONCE-1800  . warfarin  12.5 mg Oral ONCE-1800  . warfarin   Does not apply Once  . DISCONTD: cefTRIAXone (ROCEPHIN)  IV  1 g Intravenous Q24H  . DISCONTD: ipratropium  0.5 mg Nebulization Q4H   Continuous:    . sodium chloride 10 mL/hr at 03/29/11 0600  . heparin 2,600 Units/hr (03/29/11 0600)   JYN:WGNFAOZHYQMVH, acetaminophen, albuterol, guaiFENesin-codeine, ipratropium, LORazepam, LORazepam  Assessment/Plan: 56 yo M with no significant PMH admitted for new onset atrial fibrillation   1. Atrial Fibrillation- New onset A fib. Patient asymptomatic; denies CP or palpitations. Started on Diltiazem drip in the ED, but was not rate controlled at time of admission therefore the Dilt trip was titrated. Pulse rate in the 80's and transitioned to PO Diltiazem 360mg . Rate still not well controlled, therefore Dig was added yesterday by Cardiology. - HR <100 now (80's on my exam) - Started on Heparin drip to bridge to Coumadin. INR 1.29 today. Pharmacy managing - Awaiting transfer to telemetry floor (order placed yesterday.) - Will continue ASA 81 mg. - Cardiac enzymes at admission showed negative trop, elevated CK to 822, CKMB 7.5 - Followed by Cardiology, we appreciate their consult on this patient.  Will most likely cardiovert within the next month when he has recovered from acute respiratory illness and is adequately anticoagulated.  2. ?CHF- BNP 714. Patient states he has had some edema, as well as shortness of breath and cough. No history of heart failure. Edema has improved. - 2D echo shows EF of 50-55% - IVF to [facility] - Lasix 40mg  IV x2 doses per cards - Strict I/O and daily standing weights   3. Hypoxia- Patient with nonproductive cough and wheezing. Sating in the  low 90's on 3L O2 in the ED. No signs of pneumonia on CXR at admission. WBC within normal limit. Afebrile. Repeat 2 view CXR showed improved aeration, cardiomegaly without congestive failure and peribronchial thickening related to chronic bronchitis vs.smoking. Patient now with hoarse voice. - Will continue Atrovent nebs q4 hours, and Albuterol q2 prn for wheezing  - O2 via [place] to maintain sats >90%.  - CPap overnight - WBC within normal limits. Afebrile. - Started on CTX and Azithro at admission. CTX d/c'd yesterday. Will continue Azithro PO. - Prednisone 50mg   - Continue cough syrup with codeine as needed for coughing spells. (This is most likely why he was positive for Opiates on tox screen.) - Will give Cepacol drops prn  - Patient has not recovered quickly from this illness. States he smokes occasionally but does not consider himself a smoker. He will likely benefit from outpatient PFT and follow-up after this acute illness.  4. FEN/GI- Heart healthy diet. Will add Miralax prn for mild constipation. 5. PPx- Heparin drip with transition to Coumadin. Protonix while on Prednisone  6. Dispo- Pending further clinical improvement as well as anticoagulation. Transfer to tele floor and continue to monitor.   LOS: 3 days   Reita Shindler 03/29/2011, 7:50 AM

## 2011-03-30 ENCOUNTER — Inpatient Hospital Stay (HOSPITAL_COMMUNITY): Payer: Self-pay

## 2011-03-30 DIAGNOSIS — I4891 Unspecified atrial fibrillation: Secondary | ICD-10-CM

## 2011-03-30 LAB — BASIC METABOLIC PANEL
BUN: 14 mg/dL (ref 6–23)
CO2: 31 mEq/L (ref 19–32)
Chloride: 96 mEq/L (ref 96–112)
Glucose, Bld: 100 mg/dL — ABNORMAL HIGH (ref 70–99)
Potassium: 3.8 mEq/L (ref 3.5–5.1)

## 2011-03-30 LAB — CBC
HCT: 50.7 % (ref 39.0–52.0)
Hemoglobin: 16.2 g/dL (ref 13.0–17.0)
MCHC: 32 g/dL (ref 30.0–36.0)
WBC: 8.2 10*3/uL (ref 4.0–10.5)

## 2011-03-30 LAB — GLUCOSE, CAPILLARY
Glucose-Capillary: 166 mg/dL — ABNORMAL HIGH (ref 70–99)
Glucose-Capillary: 184 mg/dL — ABNORMAL HIGH (ref 70–99)

## 2011-03-30 LAB — PROTIME-INR: INR: 1.8 — ABNORMAL HIGH (ref 0.00–1.49)

## 2011-03-30 MED ORDER — WARFARIN SODIUM 7.5 MG PO TABS
7.5000 mg | ORAL_TABLET | Freq: Once | ORAL | Status: AC
  Administered 2011-03-30: 7.5 mg via ORAL
  Filled 2011-03-30: qty 1

## 2011-03-30 MED ORDER — BENZONATATE 100 MG PO CAPS
100.0000 mg | ORAL_CAPSULE | Freq: Three times a day (TID) | ORAL | Status: DC | PRN
  Administered 2011-04-02: 100 mg via ORAL
  Filled 2011-03-30: qty 1

## 2011-03-30 MED ORDER — INSULIN ASPART 100 UNIT/ML ~~LOC~~ SOLN
0.0000 [IU] | Freq: Three times a day (TID) | SUBCUTANEOUS | Status: DC
  Administered 2011-03-30: 2 [IU] via SUBCUTANEOUS
  Administered 2011-03-31: 3 [IU] via SUBCUTANEOUS
  Administered 2011-04-01: 2 [IU] via SUBCUTANEOUS
  Administered 2011-04-02: 1 [IU] via SUBCUTANEOUS
  Administered 2011-04-02: 2 [IU] via SUBCUTANEOUS
  Administered 2011-04-03 – 2011-04-04 (×2): 1 [IU] via SUBCUTANEOUS
  Filled 2011-03-30: qty 3

## 2011-03-30 MED ORDER — HEPARIN SOD (PORCINE) IN D5W 100 UNIT/ML IV SOLN
2000.0000 [IU]/h | INTRAVENOUS | Status: DC
  Administered 2011-03-30 – 2011-03-31 (×3): 2000 [IU]/h via INTRAVENOUS
  Filled 2011-03-30 (×8): qty 250

## 2011-03-30 NOTE — Progress Notes (Addendum)
ANTICOAGULATION CONSULT NOTE - Follow Up Consult  Pharmacy Consult for heparin/coumadin Indication: afib  Assessment: 56 year old male with new onset afib. Heparin level supratherapeutic level today. INR  up at 1.8 this AM. No issues with heparin line or bleeding noted.  Goal of Therapy:  INR 2-3 Heparin level 0.3-0.7 units/ml   Plan:  Decrease heparin to 2000 units/hr Check 6hr heparin Coumadin 7.5mg  PO x1     No Known Allergies  Patient Measurements: Height: 6\' 2"  (188 cm) Weight: 321 lb 10.4 oz (145.9 kg) IBW/kg (Calculated) : 82.2    Vital Signs: Temp: 98 F (36.7 C) (02/17 0600) BP: 140/80 mmHg (02/17 1002) Pulse Rate: 84  (02/17 0600)  Labs:  Basename 03/30/11 0500 03/29/11 2121 03/29/11 1354 03/29/11 0600 03/28/11 0500  HGB 16.2 -- -- -- 16.0  HCT 50.7 -- -- -- 50.3  PLT 225 -- -- -- 179  APTT -- -- -- -- --  LABPROT 21.2* -- -- 16.3* 13.9  INR 1.80* -- -- 1.29 1.05  HEPARINUNFRC 0.98* 0.60 0.83* -- --  CREATININE 0.82 -- -- -- 0.69  CKTOTAL -- -- -- -- --  CKMB -- -- -- -- --  TROPONINI -- -- -- -- --   Estimated Creatinine Clearance: 151.4 ml/min (by C-G formula based on Cr of 0.82).   Medications:  Infusions:     . sodium chloride 10 mL/hr at 03/30/11 0500  . heparin 180 Units (03/30/11 0500)  . DISCONTD: heparin 2,450 Units/hr (03/29/11 1000)

## 2011-03-30 NOTE — Progress Notes (Signed)
Patient ID: Darius [name], male   DOB: 06-16-1955, 56 y.o.   MRN: [id number] SUBJECTIVE: Darius [name] remains in A. fib. He is breathing better after diuresing 1890 yesterday and 3346 the day before. Congestion has improved.    Filed Vitals:   03/29/11 2100 03/30/11 0600 03/30/11 0906 03/30/11 1002  BP:  147/64  140/80  Pulse: 75 84    Temp: 98 F (36.7 C) 98 F (36.7 C)    TempSrc:      Resp: 18 16    Height:      Weight:      SpO2: 94% 91% 93%     Intake/Output Summary (Last 24 hours) at 03/30/11 1228 Last data filed at 03/30/11 1029  Gross per 24 hour  Intake    960 ml  Output   3075 ml  Net  -2115 ml    LABS: Basic Metabolic Panel:  Basename 03/30/11 0500 03/28/11 0500  NA 136 135  K 3.8 4.3  CL 96 95*  CO2 31 35*  GLUCOSE 100* 87  BUN 14 13  CREATININE 0.82 0.69  CALCIUM 9.5 9.1  MG -- --  PHOS -- --   Liver Function Tests: No results found for this basename: AST:2,ALT:2,ALKPHOS:2,BILITOT:2,PROT:2,ALBUMIN:2 in the last 72 hours No results found for this basename: LIPASE:2,AMYLASE:2 in the last 72 hours CBC:  Basename 03/30/11 0500 03/28/11 0500  WBC 8.2 6.9  NEUTROABS -- --  HGB 16.2 16.0  HCT 50.7 50.3  MCV 86.2 87.9  PLT 225 179   Cardiac Enzymes: No results found for this basename: CKTOTAL:3,CKMB:3,CKMBINDEX:3,TROPONINI:3 in the last 72 hours BNP: No components found with this basename: POCBNP:3 D-Dimer: No results found for this basename: DDIMER:2 in the last 72 hours Hemoglobin A1C: No results found for this basename: HGBA1C in the last 72 hours Fasting Lipid Panel: No results found for this basename: CHOL,HDL,LDLCALC,TRIG,CHOLHDL,LDLDIRECT in the last 72 hours Thyroid Function Tests: No results found for this basename: TSH,T4TOTAL,FREET3,T3FREE,THYROIDAB in the last 72 hours Anemia Panel: No results found for this basename: VITAMINB12,FOLATE,FERRITIN,TIBC,IRON,RETICCTPCT in the last 72 hours  RADIOLOGY: Dg Chest 2 View  03/30/2011   *RADIOLOGY REPORT*  Clinical Data: Shortness of breath, cough  CHEST - 2 VIEW  Comparison: 03/27/2011  Findings: Stable cardiomegaly.  Mild perihilar and bibasilar interstitial prominence as before.  No focal airspace consolidation.  No effusion.  Regional bones unremarkable.  IMPRESSION:  1.  Stable cardiomegaly and mild interstitial prominence without focal infiltrate.  Original Report Authenticated By: Osa Craver, M.D.   X-ray Chest Pa And Lateral   03/27/2011  *RADIOLOGY REPORT*  Clinical Data: Cough and shortness of breath.  CHEST - 2 VIEW  Comparison: 03/26/2011  Findings: Midline trachea.  Mild cardiomegaly. Mediastinal contours otherwise within normal limits.  Mild right hemidiaphragm elevation. No pleural effusion or pneumothorax.  Diffuse peribronchial thickening.  Improved right base aeration with mild airspace disease remaining.  Left base atelectasis as well.  IMPRESSION:  1.  Improved right base aeration.  Probable atelectasis remaining. 2. Cardiomegaly without congestive failure. 3. Peribronchial thickening which may relate to chronic bronchitis or smoking.  Original Report Authenticated By: Consuello Bossier, M.D.   Dg Chest 2 View  03/26/2011  *RADIOLOGY REPORT*  Clinical Data: Respiratory distress, questionable fever, cough  CHEST - 2 VIEW  Comparison: None.  Findings: Enlarged cardiac silhouette and mediastinal contours. There is fullness of bilateral pulmonary hila.  Mild pulmonary venous congestion without frank evidence of pulmonary edema.  Right basilar airspace opacities.  Bibasilar heterogeneous opacities.  No definite pleural effusion or pneumothorax.  Mild age indeterminate anterior compression deformity of a mid thoracic vertebral body.  IMPRESSION:  1.  Right lower lung airspace opacities worrisome for developing infection.  A follow-up chest radiograph in 4 to 6 weeks after treatment is recommended to ensure resolution. 2.  Borderline enlarged cardiac silhouette and  mediastinal contours with fullness of the bilateral pulmonary hila, nonspecific but may be seen in the setting of pulmonary arterial hypertension.  Further evaluation with cardiac echo may be obtained as clinically indicated.  Original Report Authenticated By: Waynard Reeds, M.D.    PHYSICAL EXAM General: Well developed, well nourished, in no acute distress, morbidly obese Head: Eyes PERRLA, No xanthomas.   Normal cephalic and atramatic  Lungs: Clear bilaterally to auscultation and percussion. Heart: Regular rate and rhythm, S1 S2,   Pulses are 2+ & equal.            No carotid bruit. No JVD.  No abdominal bruits. No femoral bruits. Abdomen: Bowel sounds are positive, abdomen soft and non-tender without masses or                  Hernia's noted. Msk:  Back normal, normal gait. Normal strength and tone for age. Extremities: No clubbing, cyanosis , 1+ edema  DP +1 Neuro: Alert and oriented X 3. Psych:  Good affect, responds appropriately  TELEMETRY: Reviewed telemetry pt in A. fib  ASSESSMENT AND PLAN: Darius [name] remains in A. fib probably brought on bronchitis and congestive heart failure. He is diuresed well and feels remarkably better.  TEE cardioversion tomorrow. Will need sleep evaluation. He strongly was encouraged by me to lose weight and to walkl 3 hours per week.  We'll make n.p.o. the morning. Continue anticoagulation and diuresis. Check metabolic profile in the morning. Active Problems:  Atrial fibrillation with rapid ventricular response  Obesity, Class III, BMI 40-49.9 (morbid obesity)  URI, acute    Valera Castle, MD 03/30/2011 12:28 PM

## 2011-03-30 NOTE — Progress Notes (Signed)
Seen and examined.  Discussed with Dr. Alvester Morin.  Agree with his management.  Appreciate cards help.  Good symptomatic improvement.  For TEE and cardioversion tomorrow.  No change in meds.

## 2011-03-30 NOTE — Progress Notes (Signed)
ANTICOAGULATION CONSULT NOTE - Follow Up Consult  Pharmacy Consult for heparin Indication: afib  Assessment: 56 year old male with new onset afib. Heparin level now therapeutic. No issues with heparin line or bleeding noted.  Goal of Therapy:  INR 2-3 Heparin level 0.3-0.7 units/ml   Plan:  Continue heparin at 2000 units/hr.  Plan to f/u AM heparin level.     No Known Allergies  Patient Measurements: Height: 6\' 2"  (188 cm) Weight: 321 lb 10.4 oz (145.9 kg) IBW/kg (Calculated) : 82.2    Vital Signs: Temp: 98.7 F (37.1 C) (02/17 1300) BP: 131/78 mmHg (02/17 1300) Pulse Rate: 81  (02/17 1300)  Labs:  Basename 03/30/11 1640 03/30/11 0500 03/29/11 2121 03/29/11 0600 03/28/11 0500  HGB -- 16.2 -- -- 16.0  HCT -- 50.7 -- -- 50.3  PLT -- 225 -- -- 179  APTT -- -- -- -- --  LABPROT -- 21.2* -- 16.3* 13.9  INR -- 1.80* -- 1.29 1.05  HEPARINUNFRC 0.40 0.98* 0.60 -- --  CREATININE -- 0.82 -- -- 0.69  CKTOTAL -- -- -- -- --  CKMB -- -- -- -- --  TROPONINI -- -- -- -- --   Estimated Creatinine Clearance: 151.4 ml/min (by C-G formula based on Cr of 0.82).   Medications:  Infusions:     . sodium chloride 10 mL/hr at 03/30/11 1627  . heparin    . DISCONTD: heparin 2,000 Units/hr (03/30/11 1029)

## 2011-03-30 NOTE — Progress Notes (Signed)
Subjective: Breathing subjectively improved. "my breathing is doing much better" per pt. No chest pain or increased WOB>   Objective: Vital signs in last 24 hours: Temp:  [97.9 F (36.6 C)-98.2 F (36.8 C)] 98 F (36.7 C) (02/17 0600) Pulse Rate:  [75-98] 84  (02/17 0600) Resp:  [16-20] 16  (02/17 0600) BP: (102-147)/(64-76) 147/64 mmHg (02/17 0600) SpO2:  [91 %-98 %] 91 % (02/17 0600) Weight change:  Last BM Date: 03/29/11  Intake/Output from previous day: 02/16 0701 - 02/17 0700 In: 960 [P.O.:840; I.V.:120] Out: 2850 [Urine:2850] Intake/Output this shift:    General appearance: alert, cooperative and morbidly obese Head: Normocephalic, without obvious abnormality, atraumatic, large neck girth  Back: symmetric, no curvature. ROM normal. No CVA tenderness. Resp: good overall air movement, no wheezes, actively recieving breathing treatment.  Chest wall: no tenderness Cardio: regular rate and rhythm, S1, S2 normal, no murmur, click, rub or gallop GI: obese abdomen, non tender  Extremities: extremities normal, atraumatic, no cyanosis or edema  Lab Results:  Basename 03/30/11 0500 03/28/11 0500  WBC 8.2 6.9  HGB 16.2 16.0  HCT 50.7 50.3  PLT 225 179   BMET  Basename 03/30/11 0500 03/28/11 0500  NA 136 135  K 3.8 4.3  CL 96 95*  CO2 31 35*  GLUCOSE 100* 87  BUN 14 13  CREATININE 0.82 0.69  CALCIUM 9.5 9.1    Studies/Results: Dg Chest 2 View  03/30/2011  *RADIOLOGY REPORT*  Clinical Data: Shortness of breath, cough  CHEST - 2 VIEW  Comparison: 03/27/2011  Findings: Stable cardiomegaly.  Mild perihilar and bibasilar interstitial prominence as before.  No focal airspace consolidation.  No effusion.  Regional bones unremarkable.  IMPRESSION:  1.  Stable cardiomegaly and mild interstitial prominence without focal infiltrate.  Original Report Authenticated By: Osa Craver, M.D.    Medications:  I have reviewed the patient's current medications. Scheduled:    . aspirin EC  81 mg Oral Daily  . azithromycin  250 mg Oral Daily  . digoxin  0.125 mg Oral Daily  . diltiazem  360 mg Oral Daily  . furosemide  40 mg Intravenous BID  . ipratropium  0.5 mg Nebulization Q6H  . mulitivitamin with minerals  1 tablet Oral Daily  . pantoprazole  40 mg Oral Q1200  . potassium chloride  20 mEq Oral BID  . predniSONE  50 mg Oral Q breakfast  . sodium chloride  3 mL Intravenous Q12H  . warfarin  10 mg Oral ONCE-1800  . warfarin   Does not apply Once   Continuous:   . sodium chloride 10 mL/hr at 03/30/11 0500  . heparin 180 Units (03/30/11 0500)  . DISCONTD: heparin 2,450 Units/hr (03/29/11 1000)   ZOX:[id number], acetaminophen, albuterol, guaiFENesin-codeine, ipratropium, LORazepam, LORazepam, menthol-cetylpyridinium  Assessment/Plan: 56 yo M with no significant PMH admitted for new onset atrial fibrillation   1.Cardiac- Afib likely multifactorial with body habitus (OSA) and intrinsic intrathoracic processes (CHF+ asthma + URI) contributing. Currently on dilt and dig to help with rate control. Cards currently managing. Appreciate there input.  - HR has been in 70s-90s since addition of digoxin. Dig vs. dilt may need to be further up titrated if goal HR is <80. - On hep and coumadin. INR 1.8 today. Pharmacy managing. CHADSVASC score of 3 (+/- 1 point for DM w/ A1C 6.8-though pt is currently on prednisone). Pt may be a good direct thrombin inhibitor candidate as an outpt if TEE shows no valvular component  to afib. Will defer to cardiology.  - Recent BNP 755. 2D echo showed EF of 50-55%. Likely diastolic contribution to overall profile in addition to general body habitus. Pending TEE. - Currently on IV lasix (40 BID) with 5L UOP since admission. Pt may benefit from PO torsemide once converted to oral diuretic (improved oral GI diuresis) - Strict I/O and daily standing weights 2. Pulm: Noted hypoxia on admission. Also likely multiufactorial with  contributions of body habitus (chronic intermittent hypoxia from OSA), asthma, URI, and volume overload in setting of likely diastolic CHF. Pt is also an intermittent smoker.  -O2 sats reassuring.  -Diuresis is also likely helping with this.  - Will continue Atrovent nebs q4 hours, and Albuterol q2 prn for wheezing  - O2 via [place] to maintain sats >90%.  - CPap overnight. Pt is tolerating this well.  - WBC within normal limits. Afebrile.  - No infiltrates on f/u CXR which is reassuring. Likely underlying viral bronchitis exacerbating underlying cardipulmonary conditions. Will continue with azithro for coverage of atypical process.  - Prednisone 50mg   - Continue cough syrup with codeine as needed for coughing spells. (This is most likely why he was positive for Opiates on tox screen.)  Tessalon Perles added to regimen. -Outpt PFTs.  3.DM: Noted A1C 6.8. Serum CBGs on chemistries <100 which is reassuring. Pt also on prednisone. Will place on SSI for breakthrough elevated CBGs, though this is likely not needed.  4. FEN/GI- Heart healthy diet. Will add Miralax prn for mild constipation.  5. PPx- Heparin drip with transition to Coumadin. Protonix while on Prednisone  6. Dispo- Pending further evaluation.     LOS: 4 days   Kylon Philbrook 03/30/2011, 9:04 AM

## 2011-03-31 ENCOUNTER — Encounter (HOSPITAL_COMMUNITY): Payer: Self-pay | Admitting: Dietician

## 2011-03-31 LAB — PROTIME-INR: Prothrombin Time: 22.6 seconds — ABNORMAL HIGH (ref 11.6–15.2)

## 2011-03-31 LAB — GLUCOSE, CAPILLARY
Glucose-Capillary: 102 mg/dL — ABNORMAL HIGH (ref 70–99)
Glucose-Capillary: 201 mg/dL — ABNORMAL HIGH (ref 70–99)
Glucose-Capillary: 182 mg/dL — ABNORMAL HIGH (ref 70–99)

## 2011-03-31 LAB — BASIC METABOLIC PANEL
Chloride: 97 mEq/L (ref 96–112)
GFR calc Af Amer: >90 mL/min (ref >90)
GFR calc non Af Amer: 90 mL/min — ABNORMAL LOW (ref >90)
Potassium: 5 mEq/L (ref 3.5–5.1)
Sodium: 139 mEq/L (ref 135–145)

## 2011-03-31 LAB — HEPARIN LEVEL (UNFRACTIONATED): Heparin Unfractionated: 0.54 IU/mL (ref 0.30–0.70)

## 2011-03-31 MED ORDER — FENTANYL CITRATE 0.05 MG/ML IJ SOLN: 250.0000 ug | Freq: Once | INTRAMUSCULAR | Status: DC

## 2011-03-31 MED ORDER — SODIUM CHLORIDE 0.9 % IJ SOLN: 3.0000 mL | INTRAMUSCULAR | Status: DC | PRN

## 2011-03-31 MED ORDER — SODIUM CHLORIDE 0.45 % IV SOLN
INTRAVENOUS | Status: DC
  Administered 2011-04-01: 05:00:00 via INTRAVENOUS

## 2011-03-31 MED ORDER — SODIUM CHLORIDE 0.9 % IV SOLN: 250.0000 mL | INTRAVENOUS | Status: DC | PRN

## 2011-03-31 MED ORDER — POTASSIUM CHLORIDE CRYS ER 20 MEQ PO TBCR
20.0000 meq | EXTENDED_RELEASE_TABLET | Freq: Every day | ORAL | Status: DC
  Administered 2011-04-01 – 2011-04-04 (×3): 20 meq via ORAL
  Filled 2011-03-31 (×5): qty 1

## 2011-03-31 MED ORDER — MIDAZOLAM HCL 10 MG/2ML IJ SOLN: 10.0000 mg | Freq: Once | INTRAMUSCULAR | Status: DC

## 2011-03-31 MED ORDER — WARFARIN SODIUM 7.5 MG PO TABS
7.5000 mg | ORAL_TABLET | Freq: Once | ORAL | Status: AC
  Administered 2011-03-31: 7.5 mg via ORAL
  Filled 2011-03-31: qty 1

## 2011-03-31 MED ORDER — SODIUM CHLORIDE 0.9 % IJ SOLN: 3.0000 mL | Freq: Two times a day (BID) | INTRAMUSCULAR | Status: DC

## 2011-03-31 MED ORDER — BENZOCAINE 20 % MT SOLN
1.0000 "application " | OROMUCOSAL | Status: DC | PRN
  Filled 2011-03-31: qty 57

## 2011-03-31 NOTE — Progress Notes (Signed)
PGY-1 Daily Progress Note Family Medicine Teaching Service D. [place] Rolene Arbour, MD Service Pager: [phone]  Patient name: Darius [name]  Medical record [id number] Date of birth:[birth year] Age: 56 y.o. Gender: male  LOS: 5 days   Subjective: Feling better.no Shortness of breath  Objective:  Vitals: Temp:  [97.3 F (36.3 C)-98.7 F (37.1 C)] 97.8 F (36.6 C) (02/18 0600) Pulse Rate:  [75-105] 75  (02/18 0600) Resp:  [18-20] 20  (02/18 0600) BP: (131-147)/(74-92) 147/74 mmHg (02/18 0600) SpO2:  [88 %-98 %] 95 % (02/18 0744)  Intake/Output Summary (Last 24 hours) at 03/31/11 0957 Last data filed at 03/31/11 0900  Gross per 24 hour  Intake    960 ml  Output   3925 ml  Net  -2965 ml   Physical Exam: Gen:  NAD HEENT: Moist mucous membranes CV: Irregular rate and rhythm, no murmurs rubs or gallops PULM: diminish to auscultation bilaterally. No wheezes, rhonchi or rales. ABD: Soft, non tender, normal bowel sounds. EXT: No edema Neuro: Alert and oriented x3. No focalization Labs and imaging:  CBC  Lab 03/30/11 0500 03/28/11 0500 03/27/11 0555  WBC 8.2 6.9 5.4  HGB 16.2 16.0 14.6  HCT 50.7 50.3 46.6  PLT 225 179 195   BMET  Lab 03/31/11 0730 03/30/11 0500 03/28/11 0500  NA 139 136 135  K 5.0 3.8 4.3  CL 97 96 95*  CO2 36* 31 35*  BUN 16 14 13   CREATININE 0.97 0.82 0.69  LABGLOM -- -- --  GLUCOSE 100* -- --  CALCIUM 9.8 9.5 9.1   Dg Chest 2 View 03/30/2011  IMPRESSION:  1.  Stable cardiomegaly and mild interstitial prominence without focal infiltrate.  Original Report Authenticated By: Osa Craver, M.D.   Medications: Medication Dose Route Frequency  . 0.9 %  sodium chloride infusion   Intravenous Continuous  . albuterol (PROVENTIL) (5 MG/ML) 0.5% nebulizer solution 2.5 mg  2.5 mg Nebulization Q2H PRN  . aspirin EC tablet 81 mg  81 mg Oral Daily  . benzonatate (TESSALON) capsule 100 mg  100 mg Oral TID PRN  . digoxin (LANOXIN) tablet 0.125 mg   0.125 mg Oral Daily  . diltiazem (CARDIZEM CD) 24 hr capsule 360 mg  360 mg Oral Daily  . furosemide (LASIX) injection 40 mg  40 mg Intravenous BID  . guaiFENesin-codeine 100-10 MG/5ML solution 5 mL  5 mL Oral Q4H PRN  . heparin ADULT infusion 100 units/ml (25000 units/250 ml)  2,000 Units/hr Intravenous Continuous  . insulin aspart (novoLOG) injection 0-9 Units  0-9 Units Subcutaneous TID WC  . ipratropium (ATROVENT) nebulizer solution 0.5 mg  0.5 mg Nebulization Q4H PRN  . ipratropium (ATROVENT) nebulizer solution 0.5 mg  0.5 mg Nebulization Q6H  . LORazepam (ATIVAN) tablet 1 mg  1 mg Oral Q6H PRN    . LORazepam (ATIVAN) injection 1 mg  1 mg Intravenous Q6H PRN  . menthol-cetylpyridinium (CEPACOL) lozenge 3 mg  1 lozenge Oral PRN  . mulitivitamin with minerals tablet 1 tablet  1 tablet Oral Daily  . pantoprazole (PROTONIX) EC tablet 40 mg  40 mg Oral Q1200  . potassium chloride SA (K-DUR,KLOR-CON) CR tablet 20 mEq  20 mEq Oral BID  . predniSONE (DELTASONE) tablet 50 mg  50 mg Oral Q breakfast  . warfarin (COUMADIN) tablet 7.5 mg  7.5 mg Oral ONCE-1800   Assessment and Plan:  1. Cardiovascular: -Afib:rate controlled on Cardizem and Digoxin. Anticoagulation. INR close to target of 2-3.  Today scheduled for  TEE and cardioversion at 10:00 am.  -Elevated BNP on admission, normal 2D ECHO. On Furosemide I/V good diuresis. Possible changing to PO after procedure today. 2. Respiratory: most recent CXR: with no focal infiltrate, and stable cardiomegaly. Had a course of Azithromycin, now on  prednisone 50 mg, ipratropium/albuterol nebs scheduled Q6 and Q4 PRN. Will probably change to only PRN since pt has remarkable improvement. 3. DM A1C 6.8. On SSI.   FEN/GI: NPO today Prophylaxis: Anticoagulation per pharmacy. Pantoprazole Disposition: Pending procedure today.   D. [place] Rolene Arbour, MD PGY1, [facility] Medicine Teaching Service Pager [phone] 03/31/2011

## 2011-03-31 NOTE — Progress Notes (Signed)
Per referral for medication assistance, spoke with patient about health maintenance plans. Patient agreed to meet with representative from [facility]. Contacted Johney Frame and faxed her the appropriate documents so she may assist the patient in obtaining outpatient health care.

## 2011-03-31 NOTE — Progress Notes (Signed)
ANTICOAGULATION CONSULT NOTE - Follow Up Consult  Pharmacy Consult for Heparin / Coumadin Indication: atrial fibrillation  No Known Allergies  Patient Measurements: Height: 6\' 2"  (188 cm) Weight: 321 lb 10.4 oz (145.9 kg) IBW/kg (Calculated) : 82.2   Vital Signs: Temp: 97.8 F (36.6 C) (02/18 0600) BP: 147/74 mmHg (02/18 0600) Pulse Rate: 75  (02/18 0600)  Labs:  Basename 03/31/11 0730 03/30/11 1640 03/30/11 0500 03/29/11 0600  HGB -- -- 16.2 --  HCT -- -- 50.7 --  PLT -- -- 225 --  APTT -- -- -- --  LABPROT 22.6* -- 21.2* 16.3*  INR 1.95* -- 1.80* 1.29  HEPARINUNFRC 0.54 0.40 0.98* --  CREATININE 0.97 -- 0.82 --  CKTOTAL -- -- -- --  CKMB -- -- -- --  TROPONINI -- -- -- --   Estimated Creatinine Clearance: 128 ml/min (by C-G formula based on Cr of 0.97).   Medications:  Scheduled:    . aspirin EC  81 mg Oral Daily  . azithromycin  250 mg Oral Daily  . digoxin  0.125 mg Oral Daily  . diltiazem  360 mg Oral Daily  . furosemide  40 mg Intravenous BID  . insulin aspart  0-9 Units Subcutaneous TID WC  . ipratropium  0.5 mg Nebulization Q6H  . mulitivitamin with minerals  1 tablet Oral Daily  . pantoprazole  40 mg Oral Q1200  . potassium chloride  20 mEq Oral BID  . predniSONE  50 mg Oral Q breakfast  . sodium chloride  3 mL Intravenous Q12H  . warfarin  7.5 mg Oral ONCE-1800  . warfarin   Does not apply Once    Assessment: 56 year old male on heparin/coumadin for Afib, TEE cardioversion today, heparin level therapeutic, INR=1.95  Goal of Therapy:  Heparin level 0.3-0.7 units/ml INR 2 to 3   Plan:  1) No change in heparin rate 2) Coumadin 7.5 mg po x 1 dose today 3) Follow up AM  Elwin Sleight 03/31/2011,9:42 AM

## 2011-03-31 NOTE — Progress Notes (Signed)
Family Medicine Teaching Service Attending Note  I interviewed and examined patient Darius [name] and reviewed their tests and x-rays.  I discussed with Dr. Aviva Signs and reviewed their note for today.  I agree with their assessment and plan.     Additionally  Feel much improved his breathing is better and edema decreased Appreciated cardiology's care.   Will proceed with cardioversion although unsure if will stay in sinus  Given lack of know CAD not sure he needs to be ASA and warfarin?

## 2011-03-31 NOTE — Progress Notes (Signed)
Pt states that he doesn't smoke and has never smoked despite the fact that on his history sheet it states that he is a some days current day smoker. Will change pt's history to non-smoker in his records.

## 2011-03-31 NOTE — Progress Notes (Signed)
  Patient Name: Darius [name]      SUBJECTIVE:breathing better,  Realizes seriousness of his condition  Past Medical History  Diagnosis Date  . Obesity   . A-fib     PHYSICAL EXAM Filed Vitals:   03/31/11 0206 03/31/11 0600 03/31/11 0744 03/31/11 0954  BP:  147/74  139/98  Pulse:  75    Temp:  97.8 F (36.6 C)    TempSrc:      Resp:  20    Height:      Weight:      SpO2: 98% 95% 95%     General appearance: alert, cooperative and morbidly obese Lungs: clear to auscultation bilaterally Heart: irregularly irregular rhythm Abdomen: soft, non-tender; bowel sounds normal; no masses,  no organomegaly Extremities: edema 2+ Pulses: 2+ and symmetric upper ex pulses ok Skin: Skin color, texture, turgor normal. No rashes or lesions Neurologic: Alert and oriented X 3, normal strength and tone. Normal symmetric reflexes. Normal coordination and gait  TELEMETRY: Reviewed telemetry pt in afib:    Intake/Output Summary (Last 24 hours) at 03/31/11 1147 Last data filed at 03/31/11 1039  Gross per 24 hour  Intake    720 ml  Output   4000 ml  Net  -3280 ml  Net -12 L since admission  LABS: Basic Metabolic Panel:  Lab 03/31/11 1610 03/30/11 0500 03/28/11 0500 03/27/11 0555 03/26/11 2348 03/26/11 1538  NA 139 136 135 134* -- 134*  K 5.0 3.8 4.3 4.4 -- 4.2  CL 97 96 95* 95* -- 95*  CO2 36* 31 35* 33* -- --  GLUCOSE 100* 100* 87 97 -- 115*  BUN 16 14 13 17  -- 16  CREATININE 0.97 0.82 0.69 0.90 0.85 1.00  CALCIUM 9.8 9.5 -- -- -- --  MG -- -- -- -- -- --  PHOS -- -- -- -- -- --   Cardiac Enzymes: No results found for this basename: CKTOTAL:3,CKMB:3,CKMBINDEX:3,TROPONINI:3 in the last 72 hours CBC:  Lab 03/30/11 0500 03/28/11 0500 03/27/11 0555 03/26/11 2348 03/26/11 1538 03/26/11 1513  WBC 8.2 6.9 5.4 7.7 -- 8.1  NEUTROABS -- -- -- -- -- 5.9  HGB 16.2 16.0 14.6 15.9 17.3* 16.0  HCT 50.7 50.3 46.6 48.0 51.0 47.3  MCV 86.2 87.9 86.9 86.5 -- 84.6  PLT 225 179 195 199 --  195   PROTIME:  Basename 03/31/11 0730 03/30/11 0500 03/29/11 0600  LABPROT 22.6* 21.2* 16.3*  INR 1.95* 1.80* 1.29   Echo  Nl LV function with INF wall motion abnormality. BAE L>R and RV enlargement   ASSESSMENT AND PLAN:  Patient Active Hospital Problem List: Atrial fibrillation with rapid ventricular response (03/27/2011)   Assessment: rate control better   Plan: TEE DCCV in am will discontinue dig  LAE is quite pronounced, and the likelihhod that he maintains sinus is low.  I am also a little bit surprised that his PA pressures wre normal with evidence of Rightsided chamber enlargement, so it will be important to exclude L>>R shunts at time of echo   Obesity, Class III, BMI 40-49.9 (morbid obesity) (03/27/2011)   Assessment: have discussed   Plan:       Signed, Sherryl Manges MD  03/31/2011

## 2011-04-01 ENCOUNTER — Encounter (HOSPITAL_COMMUNITY): Payer: Self-pay | Admitting: Certified Registered"

## 2011-04-01 ENCOUNTER — Encounter (HOSPITAL_COMMUNITY): Payer: Self-pay | Admitting: *Deleted

## 2011-04-01 ENCOUNTER — Encounter (HOSPITAL_COMMUNITY)
Admission: EM | Disposition: A | Discharge status: Home / Self Care | Payer: Self-pay | Source: Home / Self Care | Attending: Family Medicine

## 2011-04-01 DIAGNOSIS — I4891 Unspecified atrial fibrillation: Secondary | ICD-10-CM

## 2011-04-01 HISTORY — PX: TEE WITHOUT CARDIOVERSION: SHX5443

## 2011-04-01 HISTORY — PX: CARDIOVERSION: SHX1299

## 2011-04-01 LAB — HEPARIN LEVEL (UNFRACTIONATED): Heparin Unfractionated: 0.43 IU/mL (ref 0.30–0.70)

## 2011-04-01 LAB — GLUCOSE, CAPILLARY: Glucose-Capillary: 121 mg/dL — ABNORMAL HIGH (ref 70–99)

## 2011-04-01 SURGERY — ECHOCARDIOGRAM, TRANSESOPHAGEAL
Anesthesia: Moderate Sedation

## 2011-04-01 MED ORDER — MIDAZOLAM HCL 10 MG/2ML IJ SOLN
INTRAMUSCULAR | Status: AC
  Filled 2011-04-01: qty 2

## 2011-04-01 MED ORDER — FENTANYL CITRATE 0.05 MG/ML IJ SOLN
INTRAMUSCULAR | Status: AC
  Filled 2011-04-01: qty 2

## 2011-04-01 MED ORDER — BUTAMBEN-TETRACAINE-BENZOCAINE 2-2-14 % EX AERO
INHALATION_SPRAY | CUTANEOUS | Status: DC | PRN
  Administered 2011-04-01: 2 via TOPICAL

## 2011-04-01 MED ORDER — FENTANYL CITRATE 0.05 MG/ML IJ SOLN
INTRAMUSCULAR | Status: DC | PRN
  Administered 2011-04-01: 25 ug via INTRAVENOUS

## 2011-04-01 MED ORDER — MIDAZOLAM HCL 10 MG/2ML IJ SOLN
INTRAMUSCULAR | Status: DC | PRN
  Administered 2011-04-01 (×2): 2 mg via INTRAVENOUS

## 2011-04-01 MED ORDER — ALBUTEROL SULFATE (5 MG/ML) 0.5% IN NEBU: 2.5000 mg | INHALATION_SOLUTION | Freq: Four times a day (QID) | RESPIRATORY_TRACT | Status: DC | PRN

## 2011-04-01 MED ORDER — WARFARIN SODIUM 7.5 MG PO TABS
7.5000 mg | ORAL_TABLET | Freq: Once | ORAL | Status: AC
  Administered 2011-04-01: 7.5 mg via ORAL
  Filled 2011-04-01: qty 1

## 2011-04-01 NOTE — Progress Notes (Signed)
ANTICOAGULATION CONSULT NOTE - Follow Up Consult  Pharmacy Consult for Heparin / Coumadin Indication: atrial fibrillation  No Known Allergies  Patient Measurements: Height: 6\' 2"  (188 cm) Weight: 318 lb 14.4 oz (144.652 kg) IBW/kg (Calculated) : 82.2   Vital Signs: Temp: 97.6 F (36.4 C) (02/19 0500) Temp src: Oral (02/19 0500) BP: 138/96 mmHg (02/19 0643) Pulse Rate: 84  (02/19 0643)  Labs:  Basename 04/01/11 0505 03/31/11 0730 03/30/11 1640 03/30/11 0500  HGB -- -- -- 16.2  HCT -- -- -- 50.7  PLT -- -- -- 225  APTT -- -- -- --  LABPROT 24.0* 22.6* -- 21.2*  INR 2.11* 1.95* -- 1.80*  HEPARINUNFRC 0.43 0.54 0.40 --  CREATININE -- 0.97 -- 0.82  CKTOTAL -- -- -- --  CKMB -- -- -- --  TROPONINI -- -- -- --   Estimated Creatinine Clearance: 127.4 ml/min (by C-G formula based on Cr of 0.97).   Medications:  Scheduled:     . azithromycin  250 mg Oral Daily  . diltiazem  360 mg Oral Daily  . fentaNYL  250 mcg Intravenous Once  . furosemide  40 mg Intravenous BID  . insulin aspart  0-9 Units Subcutaneous TID WC  . midazolam  10 mg Intravenous Once  . mulitivitamin with minerals  1 tablet Oral Daily  . pantoprazole  40 mg Oral Q1200  . potassium chloride  20 mEq Oral Daily  . predniSONE  50 mg Oral Q breakfast  . sodium chloride  3 mL Intravenous Q12H  . sodium chloride  3 mL Intravenous Q12H  . warfarin  7.5 mg Oral ONCE-1800  . warfarin   Does not apply Once  . DISCONTD: aspirin EC  81 mg Oral Daily  . DISCONTD: digoxin  0.125 mg Oral Daily  . DISCONTD: ipratropium  0.5 mg Nebulization Q6H  . DISCONTD: potassium chloride  20 mEq Oral BID    Assessment: 56 year old male on heparin/coumadin for Afib, TEE cardioversion today, heparin level therapeutic, INR=2.11  Goal of Therapy:  Heparin level 0.3-0.7 units/ml INR 2 to 3   Plan:  1) No change in heparin rate - dc today? 2) Coumadin 7.5 mg po x 1 dose today 3) Follow up AM  Elwin Sleight 04/01/2011,9:29 AM

## 2011-04-01 NOTE — Procedures (Signed)
Patient given cetacaine prior to TEE; patient with signficant secretions and persistent coughing. Patient subsequently sedated with versed 4 mg IV and fentanyl 25 micrograms IV. Omniplane probe passed; patient then developed confusion and became combative; O2 sat decreased to 87; BP increased to 206/161. Patient unable to cooperate. Procedure terminated prior to obtaining images. If necessary, procedure could be performed under general anesthesia. Darius [name]

## 2011-04-01 NOTE — Progress Notes (Signed)
  Patient Name: Darius [name]      SUBJECTIVE:breathing better,  Realizes seriousness of his condition  Past Medical History  Diagnosis Date  . Obesity   . A-fib     PHYSICAL EXAM Filed Vitals:   03/31/11 1954 03/31/11 2100 04/01/11 0500 04/01/11 0643  BP:  144/81 162/119 138/96  Pulse:  87 88 84  Temp:  97.7 F (36.5 C) 97.6 F (36.4 C)   TempSrc:  Oral Oral   Resp:  20 20   Height:      Weight:   318 lb 14.4 oz (144.652 kg)   SpO2: 94% 90% 90%     General appearance: alert, cooperative and morbidly obese Lungs: clear to auscultation bilaterally Heart: irregularly irregular rhythm Abdomen: soft, non-tender; bowel sounds normal; no masses,  no organomegaly Extremities: edema 2+ Pulses: 2+ and symmetric upper ex pulses ok Skin: Skin color, texture, turgor normal. No rashes or lesions Neurologic: Alert and oriented X 3, normal strength and tone. Normal symmetric reflexes. Normal coordination and gait  TELEMETRY: Reviewed telemetry pt in afib:    Intake/Output Summary (Last 24 hours) at 04/01/11 0818 Last data filed at 04/01/11 0505  Gross per 24 hour  Intake    915 ml  Output   3250 ml  Net  -2335 ml  Net -12 L since admission  LABS: Basic Metabolic Panel:  Lab 03/31/11 0730 03/30/11 0500 03/28/11 0500 03/27/11 0555 03/26/11 2348 03/26/11 1538  NA 139 136 135 134* -- 134*  K 5.0 3.8 4.3 4.4 -- 4.2  CL 97 96 95* 95* -- 95*  CO2 36* 31 35* 33* -- --  GLUCOSE 100* 100* 87 97 -- 115*  BUN 16 14 13 17 -- 16  CREATININE 0.97 0.82 0.69 0.90 0.85 1.00  CALCIUM 9.8 9.5 -- -- -- --  MG -- -- -- -- -- --  PHOS -- -- -- -- -- --   Cardiac Enzymes: No results found for this basename: CKTOTAL:3,CKMB:3,CKMBINDEX:3,TROPONINI:3 in the last 72 hours CBC:  Lab 03/30/11 0500 03/28/11 0500 03/27/11 0555 03/26/11 2348 03/26/11 1538 03/26/11 1513  WBC 8.2 6.9 5.4 7.7 -- 8.1  NEUTROABS -- -- -- -- -- 5.9  HGB 16.2 16.0 14.6 15.9 17.3* 16.0  HCT 50.7 50.3 46.6 48.0 51.0  47.3  MCV 86.2 87.9 86.9 86.5 -- 84.6  PLT 225 179 195 199 -- 195   PROTIME:  Basename 04/01/11 0505 03/31/11 0730 03/30/11 0500  LABPROT 24.0* 22.6* 21.2*  INR 2.11* 1.95* 1.80*   Echo  Nl LV function with INF wall motion abnormality. BAE L>R and RV enlargement   ASSESSMENT AND PLAN:  Patient Active Hospital Problem List: Atrial fibrillation with rapid ventricular response (03/27/2011)   Assessment: rate control better   Plan: TEE DCCV today  I agree with Dr Chambliss,  I would not use ASA in addition to warfarin  We can stop heparin today  Would check HCO3 as he looks to have a contraction alkalosis   LAE is quite pronounced, and the likelihhod that he maintains sinus is low.  I am also a little bit surprised that his PA pressures wre normal with evidence of Rightsided chamber enlargement, so it will be important to exclude L>>R shunts at time of echo   Obesity, Class III, BMI 40-49.9 (morbid obesity) (03/27/2011)   Assessment: have discussed   Plan:       Signed, Richell Corker MD  04/01/2011   

## 2011-04-01 NOTE — Interval H&P Note (Signed)
History and Physical Interval Note:  04/01/2011 12:17 PM  Darius [name]  has presented today for surgery, with the diagnosis of a fib  The various methods of treatment have been discussed with the patient and family. After consideration of risks, benefits and other options for treatment, the patient has consented to  Procedure(s) (LRB): TRANSESOPHAGEAL ECHOCARDIOGRAM (TEE) (N/A) CARDIOVERSION (N/A) as a surgical intervention .  The patients' history has been reviewed, patient examined, no change in status, stable for surgery.  I have reviewed the patients' chart and labs.  Questions were answered to the patient's satisfaction.     [name]

## 2011-04-01 NOTE — OR Nursing (Signed)
TEE not done. Pt combative, O2 sat dropped to 87% on O2 6L Kay.

## 2011-04-01 NOTE — Progress Notes (Signed)
  Echocardiogram Echocardiogram Transesophageal has been performed.  Darius [name] 04/01/2011, 2:48 PM

## 2011-04-01 NOTE — Progress Notes (Signed)
Pt sitting on side of bed blowing nose. Pt HR increase to 130s (afib) nonsustained. No CP, SOB noted. Pt schduled for TEE/cardioversion at 1100am. Will continue to monitor. Ramond Craver, RN

## 2011-04-01 NOTE — Progress Notes (Signed)
PGY-1 Daily Progress Note Family Medicine Teaching Service D. [place]oto Rolene Arbour, MD Service Pager: [phone]  Patient name: Darius [name]  Medical record [id number] Date of birth:14-Jun-1955 Age: 56 y.o. Gender: male  LOS: 6 days   Subjective: Feling better.no Shortness of breath  Objective:  Vitals: Temp:  [97.6 F (36.4 C)-97.7 F (36.5 C)] 97.6 F (36.4 C) (02/19 0500) Pulse Rate:  [84-97] 84  (02/19 0643) Resp:  [20-22] 20  (02/19 0500) BP: (133-162)/(77-119) 133/88 mmHg (02/19 0933) SpO2:  [0 %-94 %] 90 % (02/19 0500) FiO2 (%):  [92 %-93 %] 93 % (02/18 1329) Weight:  [318 lb 14.4 oz (144.652 kg)] 318 lb 14.4 oz (144.652 kg) (02/19 0500)  Intake/Output Summary (Last 24 hours) at 04/01/11 1030 Last data filed at 04/01/11 0900  Gross per 24 hour  Intake    915 ml  Output   3250 ml  Net  -2335 ml   Physical Exam: Gen:  NAD HEENT: Moist mucous membranes CV: Irregularly irregular, no murmurs rubs or gallops PULM: diminish to auscultation bilaterally. No wheezes, rhonchi or rales. ABD: Soft, non tender, normal bowel sounds. EXT: No edema Neuro: Alert and oriented x3. No focalization  Medications: Medication Dose Route Frequency  . 0.9 %  sodium chloride infusion   Intravenous Continuous  . albuterol (PROVENTIL) (5 MG/ML) 0.5% nebulizer solution 2.5 mg  2.5 mg Nebulization Q2H PRN  . benzonatate (TESSALON) capsule 100 mg  100 mg Oral TID PRN  . diltiazem (CARDIZEM CD) 24 hr capsule 360 mg  360 mg Oral Daily  . furosemide (LASIX) injection 40 mg  40 mg Intravenous BID  . guaiFENesin-codeine 100-10 MG/5ML solution 5 mL  5 mL Oral Q4H PRN  . heparin ADULT infusion 100 units/ml (25000 units/250 ml)  2,000 Units/hr Intravenous Continuous  . insulin aspart (novoLOG) injection 0-9 Units  0-9 Units Subcutaneous TID WC  . ipratropium (ATROVENT) nebulizer solution 0.5 mg  0.5 mg Nebulization Q4H PRN  . ipratropium (ATROVENT) nebulizer solution 0.5 mg  0.5 mg Nebulization Q6H    . LORazepam (ATIVAN) tablet 1 mg  1 mg Oral Q6H PRN    . LORazepam (ATIVAN) injection 1 mg  1 mg Intravenous Q6H PRN  . menthol-cetylpyridinium (CEPACOL) lozenge 3 mg  1 lozenge Oral PRN  . mulitivitamin with minerals tablet 1 tablet  1 tablet Oral Daily  . pantoprazole (PROTONIX) EC tablet 40 mg  40 mg Oral Q1200  . potassium chloride SA (K-DUR,KLOR-CON) CR tablet 20 mEq  20 mEq Oral BID  . predniSONE (DELTASONE) tablet 50 mg  50 mg Oral Q breakfast  . warfarin (COUMADIN) tablet 7.5 mg  7.5 mg Oral ONCE-1800   Assessment and Plan: 56 y/o M admitted SOB that was found to have newly diagnosed Afib. 1. Cardiovascular: -Afib:rate controlled on Cardizem. Anticoagulation on warfarin and heparin therapeutic INR. Will discontinue heparin.  Today scheduled for  TEE and cardioversion, was scheduled for yesterday but was cancelled -Elevated BNP on admission, normal 2D ECHO. On Furosemide I/V good diuresis. Possible changing to PO after procedure today. 2. Respiratory: most recent CXR: with no focal infiltrate, and stable cardiomegaly. Had a course of Azithromycin, now on  prednisone 50 mg, ipratropium/albuterol nebs PRN.  3. DM A1C 6.8. On SSI.   FEN/GI: NPO today Prophylaxis: Anticoagulation per pharmacy. Pantoprazole Disposition: Pending procedure today.   D. [place] Rolene Arbour, MD PGY1, [facility] Medicine Teaching Service Pager [phone] 04/01/2011

## 2011-04-01 NOTE — Anesthesia Preprocedure Evaluation (Deleted)
Anesthesia Evaluation  Patient identified by MRN, date of birth, ID band Patient confused    Reviewed: Allergy & Precautions, H&P , NPO status , Patient's Chart, lab work & pertinent test results  Airway Mallampati: II TM Distance: >3 FB Neck ROM: Full    Dental   Pulmonary shortness of breath, sleep apnea ,          Cardiovascular + dysrhythmias Atrial Fibrillation Irregular Normal    Neuro/Psych    GI/Hepatic   Endo/Other  Morbid obesity  Renal/GU      Musculoskeletal   Abdominal Normal abdominal exam  (+)   Peds  Hematology   Anesthesia Other Findings Sedate after tee  Reproductive/Obstetrics                           Anesthesia Physical Anesthesia Plan  ASA: III  Anesthesia Plan: General   Post-op Pain Management:    Induction: Intravenous  Airway Management Planned: Mask  Additional Equipment:   Intra-op Plan:   Post-operative Plan:   Informed Consent: I have reviewed the patients History and Physical, chart, labs and discussed the procedure including the risks, benefits and alternatives for the proposed anesthesia with the patient or authorized representative who has indicated his/her understanding and acceptance.   History available from chart only  Plan Discussed with: CRNA and Surgeon  Anesthesia Plan Comments:         Anesthesia Quick Evaluation

## 2011-04-01 NOTE — H&P (View-Only) (Signed)
  Patient Name: Darius [name]      SUBJECTIVE:breathing better,  Realizes seriousness of his condition  Past Medical History  Diagnosis Date  . Obesity   . A-fib     PHYSICAL EXAM Filed Vitals:   03/31/11 1954 03/31/11 2100 04/01/11 0500 04/01/11 0643  BP:  144/81 162/119 138/96  Pulse:  87 88 84  Temp:  97.7 F (36.5 C) 97.6 F (36.4 C)   TempSrc:  Oral Oral   Resp:  20 20   Height:      Weight:   318 lb 14.4 oz (144.652 kg)   SpO2: 94% 90% 90%     General appearance: alert, cooperative and morbidly obese Lungs: clear to auscultation bilaterally Heart: irregularly irregular rhythm Abdomen: soft, non-tender; bowel sounds normal; no masses,  no organomegaly Extremities: edema 2+ Pulses: 2+ and symmetric upper ex pulses ok Skin: Skin color, texture, turgor normal. No rashes or lesions Neurologic: Alert and oriented X 3, normal strength and tone. Normal symmetric reflexes. Normal coordination and gait  TELEMETRY: Reviewed telemetry pt in afib:    Intake/Output Summary (Last 24 hours) at 04/01/11 0818 Last data filed at 04/01/11 0505  Gross per 24 hour  Intake    915 ml  Output   3250 ml  Net  -2335 ml  Net -12 L since admission  LABS: Basic Metabolic Panel:  Lab 03/31/11 0981 03/30/11 0500 03/28/11 0500 03/27/11 0555 03/26/11 2348 03/26/11 1538  NA 139 136 135 134* -- 134*  K 5.0 3.8 4.3 4.4 -- 4.2  CL 97 96 95* 95* -- 95*  CO2 36* 31 35* 33* -- --  GLUCOSE 100* 100* 87 97 -- 115*  BUN 16 14 13 17  -- 16  CREATININE 0.97 0.82 0.69 0.90 0.85 1.00  CALCIUM 9.8 9.5 -- -- -- --  MG -- -- -- -- -- --  PHOS -- -- -- -- -- --   Cardiac Enzymes: No results found for this basename: CKTOTAL:3,CKMB:3,CKMBINDEX:3,TROPONINI:3 in the last 72 hours CBC:  Lab 03/30/11 0500 03/28/11 0500 03/27/11 0555 03/26/11 2348 03/26/11 1538 03/26/11 1513  WBC 8.2 6.9 5.4 7.7 -- 8.1  NEUTROABS -- -- -- -- -- 5.9  HGB 16.2 16.0 14.6 15.9 17.3* 16.0  HCT 50.7 50.3 46.6 48.0 51.0  47.3  MCV 86.2 87.9 86.9 86.5 -- 84.6  PLT 225 179 195 199 -- 195   PROTIME:  Basename 04/01/11 0505 03/31/11 0730 03/30/11 0500  LABPROT 24.0* 22.6* 21.2*  INR 2.11* 1.95* 1.80*   Echo  Nl LV function with INF wall motion abnormality. BAE L>R and RV enlargement   ASSESSMENT AND PLAN:  Patient Active Hospital Problem List: Atrial fibrillation with rapid ventricular response (03/27/2011)   Assessment: rate control better   Plan: TEE DCCV today  I agree with Dr Deirdre Priest,  I would not use ASA in addition to warfarin  We can stop heparin today  Would check HCO3 as he looks to have a contraction alkalosis   LAE is quite pronounced, and the likelihhod that he maintains sinus is low.  I am also a little bit surprised that his PA pressures wre normal with evidence of Rightsided chamber enlargement, so it will be important to exclude L>>R shunts at time of echo   Obesity, Class III, BMI 40-49.9 (morbid obesity) (03/27/2011)   Assessment: have discussed   Plan:       Signed, Sherryl Manges MD  04/01/2011

## 2011-04-01 NOTE — Progress Notes (Signed)
Family Medicine Teaching Service Attending Note  I discussed patient [place]  with Dr. Aviva Signs and reviewed their note for today.  I agree with their assessment and plan.

## 2011-04-01 NOTE — Progress Notes (Signed)
Pt. Said that he would put CPAP on when ready for bed. CPAP is set up at bedside. Pt. Was made aware to tell RN to call RT if he had any complications.

## 2011-04-02 ENCOUNTER — Encounter (HOSPITAL_COMMUNITY): Payer: Self-pay | Admitting: Cardiology

## 2011-04-02 LAB — PROTIME-INR: INR: 2.35 — ABNORMAL HIGH (ref 0.00–1.49)

## 2011-04-02 LAB — GLUCOSE, CAPILLARY
Glucose-Capillary: 105 mg/dL — ABNORMAL HIGH (ref 70–99)
Glucose-Capillary: 165 mg/dL — ABNORMAL HIGH (ref 70–99)

## 2011-04-02 LAB — HEPARIN LEVEL (UNFRACTIONATED): Heparin Unfractionated: <0.1 IU/mL — ABNORMAL LOW (ref 0.30–0.70)

## 2011-04-02 MED ORDER — DILTIAZEM HCL ER COATED BEADS 360 MG PO CP24: 360.0000 mg | ORAL_CAPSULE | Freq: Every day | ORAL | Status: DC

## 2011-04-02 MED ORDER — SODIUM CHLORIDE 0.9 % IJ SOLN
3.0000 mL | Freq: Two times a day (BID) | INTRAMUSCULAR | Status: DC
  Administered 2011-04-02 – 2011-04-03 (×2): 3 mL via INTRAVENOUS

## 2011-04-02 MED ORDER — BENZONATATE 100 MG PO CAPS: 100.0000 mg | ORAL_CAPSULE | Freq: Three times a day (TID) | ORAL | Status: AC | PRN

## 2011-04-02 MED ORDER — SODIUM CHLORIDE 0.9 % IV SOLN: 250.0000 mL | INTRAVENOUS | Status: DC | PRN

## 2011-04-02 MED ORDER — SODIUM CHLORIDE 0.45 % IV SOLN
INTRAVENOUS | Status: DC
  Administered 2011-04-02 – 2011-04-03 (×2): via INTRAVENOUS

## 2011-04-02 MED ORDER — WARFARIN SODIUM 7.5 MG PO TABS: 7.5000 mg | ORAL_TABLET | Freq: Once | ORAL | Status: DC

## 2011-04-02 MED ORDER — ADULT MULTIVITAMIN W/MINERALS CH: 1.0000 | ORAL_TABLET | Freq: Every day | ORAL | Status: DC

## 2011-04-02 MED ORDER — FUROSEMIDE 20 MG PO TABS: 40.0000 mg | ORAL_TABLET | Freq: Every morning | ORAL | Status: DC

## 2011-04-02 MED ORDER — MENTHOL 3 MG MT LOZG: 1.0000 | LOZENGE | OROMUCOSAL | Status: AC | PRN

## 2011-04-02 MED ORDER — ALBUTEROL SULFATE HFA 108 (90 BASE) MCG/ACT IN AERS: 2.0000 | INHALATION_SPRAY | Freq: Four times a day (QID) | RESPIRATORY_TRACT | Status: DC | PRN

## 2011-04-02 MED ORDER — WARFARIN SODIUM 7.5 MG PO TABS
7.5000 mg | ORAL_TABLET | Freq: Once | ORAL | Status: AC
  Administered 2011-04-02: 7.5 mg via ORAL
  Filled 2011-04-02: qty 1

## 2011-04-02 MED ORDER — SODIUM CHLORIDE 0.9 % IJ SOLN: 3.0000 mL | INTRAMUSCULAR | Status: DC | PRN

## 2011-04-02 MED ORDER — METFORMIN HCL 500 MG PO TABS: 500.0000 mg | ORAL_TABLET | Freq: Two times a day (BID) | ORAL | Status: DC

## 2011-04-02 MED ORDER — FUROSEMIDE 20 MG PO TABS
20.0000 mg | ORAL_TABLET | Freq: Two times a day (BID) | ORAL | Status: DC
  Administered 2011-04-02 – 2011-04-04 (×4): 20 mg via ORAL
  Filled 2011-04-02 (×7): qty 1

## 2011-04-02 NOTE — Discharge Summary (Signed)
Physician Discharge Summary  Patient ID: Darius [name] [id number] 03-Aug-1955 56 y.o.  Admit date: 03/26/2011 Discharge date: 04/04/2011  PCP: No primary provider on file.   Discharge Diagnosis: 1. Atrial Fibrilation. 2. Bronchitis vs COPD exacerbation 3. DM 4. Obesity.   Discharge Medications  Jahmarion, [place]  Home Medication Instructions [id number]   Printed on:04/05/11 1758  Medication Information                    guaiFENesin (MUCINEX) 600 MG 12 hr tablet Take 1,200 mg by mouth 2 (two) times daily as needed. As needed for congestion.           benzonatate (TESSALON) 100 MG capsule Take 1 capsule (100 mg total) by mouth 3 (three) times daily as needed for cough.           furosemide (LASIX) 20 MG tablet Take 2 tablets (40 mg total) by mouth every morning.           menthol-cetylpyridinium (CEPACOL) 3 MG lozenge Take 1 lozenge (3 mg total) by mouth as needed.           Multiple Vitamin (MULITIVITAMIN WITH MINERALS) TABS Take 1 tablet by mouth daily.           albuterol (PROVENTIL HFA;VENTOLIN HFA) 108 (90 BASE) MCG/ACT inhaler Inhale 2 puffs into the lungs every 6 (six) hours as needed for wheezing (no more than 2 puffs in 1 hour).           metFORMIN (GLUCOPHAGE) 500 MG tablet Take 1 tablet (500 mg total) by mouth 2 (two) times daily with a meal.           amiodarone (PACERONE) 400 MG tablet Take 1 tablet (400 mg total) by mouth 2 (two) times daily.           metoprolol (LOPRESSOR) 50 MG tablet Take 1 tablet (50 mg total) by mouth 2 (two) times daily.           warfarin (COUMADIN) 6 MG tablet Take 1 tablet (6 mg total) by mouth daily at 6 PM.           diltiazem (CARDIZEM CD) 240 MG 24 hr capsule Take 1 capsule (240 mg total) by mouth daily.              Consults: Cardiology. Dr Sherryl Manges   Labs: CBC  Lab 03/30/11 0500  WBC 8.2  HGB 16.2  HCT 50.7  PLT 225   BMET  Lab 04/03/11 1012 03/31/11 0730 03/30/11 0500  NA 136 139 136  K 4.6  5.0 3.8  CL 95* 97 96  CO2 32 36* 31  BUN 23 16 14   CREATININE 0.91 0.97 0.82  CALCIUM 9.6 9.8 9.5  PROT -- -- --  BILITOT -- -- --  ALKPHOS -- -- --  ALT -- -- --  AST -- -- --  GLUCOSE 114* 100* 100*   PROTIME-INR     Status: Abnormal   Collection Time   04/04/11  5:30 AM      Component Value Range Comment   Prothrombin Time 26.5 (*) 11.6 - 15.2 (seconds)    INR 2.39 (*) 0.00 - 1.49     CBG (last 3)   Basename 04/04/11 0735 04/03/11 2108 04/03/11 1610  GLUCAP 92 111* 147*    Procedures/Imaging:  Dg Chest 2 View 03/30/2011    IMPRESSION:  1.  Stable cardiomegaly and mild interstitial prominence without focal infiltrate.  Original Report Authenticated  By: D. DANIEL HASSELL III, M.D.   X-ray Chest Pa And Lateral  03/27/2011    IMPRESSION:  1.  Improved right base aeration.  Probable atelectasis remaining. 2. Cardiomegaly without congestive failure. 3. Peribronchial thickening which may relate to chronic bronchitis or smoking.  Original Report Authenticated By: Consuello Bossier, M.D.   Dg Chest 2 View 03/26/2011   IMPRESSION:  1.  Right lower lung airspace opacities worrisome for developing infection.  A follow-up chest radiograph in 4 to 6 weeks after treatment is recommended to ensure resolution. 2.  Borderline enlarged cardiac silhouette and mediastinal contours with fullness of the bilateral pulmonary hila, nonspecific but may be seen in the setting of pulmonary arterial hypertension.  Further evaluation with cardiac echo may be obtained as clinically indicated.  Original Report Authenticated By: Waynard Reeds, M.D.   Brief Hospital Course: 56 yo M with no significant past medical history other than obesity admitted for new diagnosis of atrial fibrillation:  1. Cardiovascular: Atrial Fibrillation:  -Rate controlled innitially on Cardizem drip that transition to PO 360mg . Pt went to TEE and Cardioversion twice, first time failed. Pt requested general anesthesia and was performed  at the second attempt. Pt was back to irregular rhythm next morning after procedure. Cardizem was decreased to 240 mg and started on  Amiodarone 400 mg BID and Metoprolol 50 mg BID. Rate controlled at the time of discharge, with no symptoms of SOB, chest pain or palpitations after a trial of walking around the hospital floor.  -Anticoagulation initially with heparin and was bridge to warfarin Pt  was discharged after therapeutic INR. Will be f/u by [facility] Cardiology Coumadin clinic with next appointment set up on Monday 28th Feb at 10:30 am. No need to adjust coumadin for amiodarone after a week on amiodarone. No dose dependant interaction. (clarified with pharmacy at discharge) -Myoview will be arranged by Cardiolaogy s outpatient to better evaluate AA tx. CHF?: Was not clear during this hospitalization if there was a component of CHF. BNP in 714 ECHO showed  EF of 50 -55%. Pt initially presented with SOB that  benefit from diuretic treatment. Was discharge home on Furosemide orally and will be reevaluated in less than a week with Cardiology.  2. Respiratory:  COPD? Non productive cough, wheezing and hypoxia sating in the low 90's on 3L O2 in the ED. No signs of pneumonia on CXR at admission. WBC within normal limit. Afebrile. Repeat 2 view CXR showed improved aeration, cardiomegaly without congestive failure and peribronchial thickening related to chronic bronchitis vs.smoking. Pt does smokes occasionally but does not consider himself a smoker. Hard to stablish a diagnosis of a chronic condition on an acute setting. Pt should benefit also of Sleep Studies.   3. Endocrine DM: A1C 6.8. On SSI while hospitalized.  Metformin was started at discharge and should be f/u as outpatient.  Obesity: Obesity, Class III, BMI 40-49.9 (morbid obesity)  Patient condition at time of discharge/disposition:  Patient is discharge home on stable medical condition.   Follow up issues: 1. Sleep studies to evaluate for  sleep apnea. 2. Myoview 3. DM control  Discharge follow up: Discharge Orders    Future Appointments: Provider: Department: Dept Phone: Center:   04/07/2011 3:45 PM [facility] [phone] None   04/10/2011 10:30 AM Jacolyn Reedy, PA [facility]: [place] [facility] [phone] [facility]    Pt should make an appointment with [facility] for f/u outpatient primary care.   D. Pil[place] Rolene Arbour, MD  04/04/2011     

## 2011-04-02 NOTE — Progress Notes (Addendum)
PGY-1 Daily Progress Note Family Medicine Teaching Service D. Piloto Rolene Arbour, MD Service Pager: [phone]  Patient name: Darius [name]  Medical record [id number] Date of birth:[birth year] Age: 56 y.o. Gender: male  LOS: 7 days   Subjective: Feling better. No Shortness of breath.   Objective:  Vitals: Temp:  [98.3 F (36.8 C)-98.8 F (37.1 C)] 98.5 F (36.9 C) (02/20 0500) Pulse Rate:  [66-102] 66  (02/20 0829) Resp:  [8-93] 20  (02/20 0500) BP: (127-206)/(65-161) 149/80 mmHg (02/20 0829) SpO2:  [89 %-100 %] 91 % (02/20 0500) Weight:  [326 lb 4.5 oz (148 kg)] 326 lb 4.5 oz (148 kg) (02/20 0500)  Intake/Output Summary (Last 24 hours) at 04/02/11 0843 Last data filed at 04/02/11 0500  Gross per 24 hour  Intake    440 ml  Output   2700 ml  Net  -2260 ml  Total output since hospitalization -16 l , weight first day of admission 333 lb (no previous weight reference), down to 318 lb, now back to 326 today.  Physical Exam: Gen:  NAD HEENT: Moist mucous membranes CV: Irregularly irregular, no murmurs rubs or gallops PULM: diminish to auscultation bilaterally. No wheezes, rhonchi or rales. ABD: Soft, non tender, normal bowel sounds. EXT: No edema Neuro: Alert and oriented x3. No focalization  Medications: Medication Dose Route Frequency  . 0.9 %  sodium chloride infusion   Intravenous Continuous  . albuterol (PROVENTIL) (5 MG/ML) 0.5% nebulizer solution 2.5 mg  2.5 mg Nebulization Q2H PRN  . benzonatate (TESSALON) capsule 100 mg  100 mg Oral TID PRN  . diltiazem (CARDIZEM CD) 24 hr capsule 360 mg  360 mg Oral Daily  . furosemide (LASIX) injection 40 mg  40 mg Intravenous BID  . guaiFENesin-codeine 100-10 MG/5ML solution 5 mL  5 mL Oral Q4H PRN  . heparin ADULT infusion 100 units/ml (25000 units/250 ml)  2,000 Units/hr Intravenous Continuous  . insulin aspart (novoLOG) injection 0-9 Units  0-9 Units Subcutaneous TID WC  . ipratropium (ATROVENT) nebulizer solution 0.5 mg   0.5 mg Nebulization Q4H PRN  . ipratropium (ATROVENT) nebulizer solution 0.5 mg  0.5 mg Nebulization Q6H  . LORazepam (ATIVAN) tablet 1 mg  1 mg Oral Q6H PRN    . LORazepam (ATIVAN) injection 1 mg  1 mg Intravenous Q6H PRN  . menthol-cetylpyridinium (CEPACOL) lozenge 3 mg  1 lozenge Oral PRN  . mulitivitamin with minerals tablet 1 tablet  1 tablet Oral Daily  . pantoprazole (PROTONIX) EC tablet 40 mg  40 mg Oral Q1200  . potassium chloride SA (K-DUR,KLOR-CON) CR tablet 20 mEq  20 mEq Oral BID  . predniSONE (DELTASONE) tablet 50 mg  50 mg Oral Q breakfast  . warfarin (COUMADIN) tablet 7.5 mg  7.5 mg Oral ONCE-1800   Assessment and Plan: 56 y/o M admitted SOB that was found to have newly diagnosed Afib. 1. Cardiovascular: -Afib:rate controlled on Cardizem. Anticoagulation on warfarin therapeutic INR. .   TEE and cardioversion failed. Cards recommends discharge home and Cardioversion without TEE in 3 weeks. Also the adittion of betablocker. We will discussed risk-benefit since pt also have respiratory condition. -Elevated BNP on admission, normal 2D ECHO. Furosemide will be changed to PO 20 mg BID 2. Respiratory:  on  prednisone 50 mg, ipratropium/albuterol nebs PRN. Mostly upper respiratory congestion now. No SOB and negative physical exam. 3. DM A1C 6.8. On SSI will start metformin at discharge and f/u as outpatient.  FEN/GI: CHO modified Prophylaxis: Anticoagulation per pharmacy.  Pantoprazole Disposition: possible d/c today.  D. Piloto Rolene Arbour, MD PGY1, [facility] Medicine Teaching Service Pager [phone] 04/02/2011

## 2011-04-02 NOTE — Progress Notes (Signed)
Received call from patient's nurse that patient now wants to proceed with TEE/DCCV while inpatient. He has already eaten for today. I discussed with Dr. Graciela Husbands, and per Dr. Ludwig Clarks prior note, would require general anesthesia. Dr. Graciela Husbands feels we are able to proceed with this. Will keep NPO after midnight and consent him. Dr. Jens Som is scheduled to do test at 12:30pm tomorrow. This was conveyed to patient and patient's nurse.  Dinh Ayotte PA-C

## 2011-04-02 NOTE — Progress Notes (Signed)
ANTICOAGULATION CONSULT NOTE - Follow Up Consult  Pharmacy Consult for Heparin / Coumadin Indication: atrial fibrillation  No Known Allergies  Patient Measurements: Height: 6\' 2"  (188 cm) Weight: 326 lb 4.5 oz (148 kg) IBW/kg (Calculated) : 82.2   Vital Signs: Temp: 98.5 F (36.9 C) (02/20 0500) BP: 103/68 mmHg (02/20 0900) Pulse Rate: 80  (02/20 0900)  Labs:  Basename 04/02/11 0618 04/01/11 0505 03/31/11 0730  HGB -- -- --  HCT -- -- --  PLT -- -- --  APTT -- -- --  LABPROT 26.1* 24.0* 22.6*  INR 2.35* 2.11* 1.95*  HEPARINUNFRC <0.10* 0.43 0.54  CREATININE -- -- 0.97  CKTOTAL -- -- --  CKMB -- -- --  TROPONINI -- -- --   Estimated Creatinine Clearance: 128.9 ml/min (by C-G formula based on Cr of 0.97).   Medications:  Scheduled:     . diltiazem  360 mg Oral Daily  . furosemide  20 mg Oral BID  . insulin aspart  0-9 Units Subcutaneous TID WC  . mulitivitamin with minerals  1 tablet Oral Daily  . pantoprazole  40 mg Oral Q1200  . potassium chloride  20 mEq Oral Daily  . predniSONE  50 mg Oral Q breakfast  . sodium chloride  3 mL Intravenous Q12H  . warfarin  7.5 mg Oral ONCE-1800  . warfarin   Does not apply Once  . DISCONTD: fentaNYL  250 mcg Intravenous Once  . DISCONTD: furosemide  40 mg Intravenous BID  . DISCONTD: midazolam  10 mg Intravenous Once  . DISCONTD: sodium chloride  3 mL Intravenous Q12H    Assessment: 56 y/o male with new afib  Coumadin: INR up to 2.35. Unable to endure TEE per MD note. Try again in 3 weeks.  Goal of Therapy:  INR 2 to 3   Plan:  Coumadin 7.5mg  po x 1 today.  Merilynn Finland, [employer] 04/02/2011,11:22 AM

## 2011-04-02 NOTE — Progress Notes (Signed)
Patient Name: Darius [name]      SUBJECTIVE  Unable to endure TEE yesterday relatviely asymptomatic  Past Medical History  Diagnosis Date  . Obesity   . A-fib   . Shortness of breath   . Sleep apnea     PHYSICAL EXAM Filed Vitals:   04/01/11 1405 04/01/11 2100 04/01/11 2212 04/02/11 0500  BP: 130/85 133/88  141/89  Pulse:  72 75 102  Temp:  98.3 F (36.8 C)  98.5 F (36.9 C)  TempSrc:      Resp:  21 20 20   Height:      Weight:    326 lb 4.5 oz (148 kg)  SpO2: 92% 91% 94% 91%    General appearance: alert, cooperative and morbidly obese Lungs: clear to auscultation bilaterally Heart: irregularly irregular rhythm Abdomen: soft, non-tender; bowel sounds normal; no masses,  no organomegaly Extremities: edema 2+ Pulses: 2+ and symmetric upper ex pulses ok Skin: Skin color, texture, turgor normal. No rashes or lesions Neurologic: Alert and oriented X 3, normal strength and tone. Normal symmetric reflexes. Normal coordination and gait  TELEMETRY: Reviewed telemetry pt in afib:    Intake/Output Summary (Last 24 hours) at 04/02/11 0723 Last data filed at 04/02/11 0500  Gross per 24 hour  Intake    440 ml  Output   2700 ml  Net  -2260 ml  Net -12 L since admission  LABS: Basic Metabolic Panel:  Lab 03/31/11 1610 03/30/11 0500 03/28/11 0500 03/27/11 0555 03/26/11 2348 03/26/11 1538  NA 139 136 135 134* -- 134*  K 5.0 3.8 4.3 4.4 -- 4.2  CL 97 96 95* 95* -- 95*  CO2 36* 31 35* 33* -- --  GLUCOSE 100* 100* 87 97 -- 115*  BUN 16 14 13 17  -- 16  CREATININE 0.97 0.82 0.69 0.90 0.85 1.00  CALCIUM 9.8 9.5 -- -- -- --  MG -- -- -- -- -- --  PHOS -- -- -- -- -- --   Cardiac Enzymes: No results found for this basename: CKTOTAL:3,CKMB:3,CKMBINDEX:3,TROPONINI:3 in the last 72 hours CBC:  Lab 03/30/11 0500 03/28/11 0500 03/27/11 0555 03/26/11 2348 03/26/11 1538 03/26/11 1513  WBC 8.2 6.9 5.4 7.7 -- 8.1  NEUTROABS -- -- -- -- -- 5.9  HGB 16.2 16.0 14.6 15.9 17.3*  16.0  HCT 50.7 50.3 46.6 48.0 51.0 47.3  MCV 86.2 87.9 86.9 86.5 -- 84.6  PLT 225 179 195 199 -- 195   PROTIME:  Basename 04/02/11 0618 04/01/11 0505 03/31/11 0730  LABPROT 26.1* 24.0* 22.6*  INR 2.35* 2.11* 1.95*   Echo  Nl LV function with INF wall motion abnormality. BAE L>R and RV enlargement   ASSESSMENT AND PLAN:  Patient Active Hospital Problem List: Atrial fibrillation with rapid ventricular response (03/27/2011)   Assessment: rate control better   Plan: TEE DCCV failed  I agree with Dr Deirdre Priest,  I would not use ASA in addition to warfarin  Off Heparin--  Would discharge to home, and we can do DCCV in 3 weeks without TEE he is sufficiently improved that this should be fine  Add low dose beta blocker (atenolol 25 or such   LAE is quite pronounced, and the likelihhod that he maintains sinus is low.  I am also a little bit surprised that his PA pressures wre normal with evidence of Rightsided chamber enlargement, so it will be important to exclude L>>R shunts at time of echo  Needs outpt sleep study  Obesity, Class III, BMI  40-49.9 (morbid obesity) (03/27/2011)   Assessment: have discussed       Signed, Sherryl Manges MD  04/02/2011

## 2011-04-03 ENCOUNTER — Encounter (HOSPITAL_COMMUNITY): Payer: Self-pay | Admitting: Anesthesiology

## 2011-04-03 ENCOUNTER — Encounter (HOSPITAL_COMMUNITY)
Admission: EM | Disposition: A | Discharge status: Home / Self Care | Payer: Self-pay | Source: Home / Self Care | Attending: Family Medicine

## 2011-04-03 ENCOUNTER — Other Ambulatory Visit: Payer: Self-pay

## 2011-04-03 ENCOUNTER — Inpatient Hospital Stay (HOSPITAL_COMMUNITY): Payer: Self-pay | Admitting: Anesthesiology

## 2011-04-03 ENCOUNTER — Encounter (HOSPITAL_COMMUNITY): Payer: Self-pay | Admitting: Cardiology

## 2011-04-03 DIAGNOSIS — I4891 Unspecified atrial fibrillation: Secondary | ICD-10-CM

## 2011-04-03 HISTORY — PX: TEE WITHOUT CARDIOVERSION: SHX5443

## 2011-04-03 HISTORY — PX: CARDIOVERSION: SHX1299

## 2011-04-03 LAB — BASIC METABOLIC PANEL
BUN: 23 mg/dL (ref 6–23)
CO2: 32 mEq/L (ref 19–32)
Chloride: 95 mEq/L — ABNORMAL LOW (ref 96–112)
Glucose, Bld: 114 mg/dL — ABNORMAL HIGH (ref 70–99)
Potassium: 4.6 mEq/L (ref 3.5–5.1)
Sodium: 136 mEq/L (ref 135–145)

## 2011-04-03 LAB — GLUCOSE, CAPILLARY
Glucose-Capillary: 140 mg/dL — ABNORMAL HIGH (ref 70–99)
Glucose-Capillary: 111 mg/dL — ABNORMAL HIGH (ref 70–99)

## 2011-04-03 SURGERY — ECHOCARDIOGRAM, TRANSESOPHAGEAL
Anesthesia: General

## 2011-04-03 MED ORDER — ONDANSETRON HCL 4 MG/2ML IJ SOLN: 4.0000 mg | Freq: Once | INTRAMUSCULAR | Status: DC | PRN

## 2011-04-03 MED ORDER — BENZOCAINE 20 % MT SOLN: 1.0000 "application " | OROMUCOSAL | Status: DC | PRN

## 2011-04-03 MED ORDER — MORPHINE SULFATE 4 MG/ML IJ SOLN: 0.0500 mg/kg | INTRAMUSCULAR | Status: DC | PRN

## 2011-04-03 MED ORDER — MEPERIDINE HCL 25 MG/ML IJ SOLN: 6.2500 mg | INTRAMUSCULAR | Status: DC | PRN

## 2011-04-03 MED ORDER — HYDROMORPHONE HCL PF 1 MG/ML IJ SOLN: 0.2500 mg | INTRAMUSCULAR | Status: DC | PRN

## 2011-04-03 MED ORDER — LIDOCAINE HCL (CARDIAC) 20 MG/ML IV SOLN
INTRAVENOUS | Status: DC | PRN
  Administered 2011-04-03: 100 mg via INTRAVENOUS

## 2011-04-03 MED ORDER — PROPOFOL 10 MG/ML IV EMUL
INTRAVENOUS | Status: DC | PRN
  Administered 2011-04-03: 50 mg via INTRAVENOUS
  Administered 2011-04-03: 150 mg via INTRAVENOUS
  Administered 2011-04-03: 200 mg via INTRAVENOUS

## 2011-04-03 MED ORDER — SODIUM CHLORIDE 0.9 % IV SOLN
INTRAVENOUS | Status: DC
  Administered 2011-04-03: 500 mL via INTRAVENOUS

## 2011-04-03 MED ORDER — SUCCINYLCHOLINE CHLORIDE 20 MG/ML IJ SOLN
INTRAMUSCULAR | Status: DC | PRN
  Administered 2011-04-03: 100 mg via INTRAVENOUS

## 2011-04-03 MED ORDER — LACTATED RINGERS IV SOLN
INTRAVENOUS | Status: DC | PRN
  Administered 2011-04-03: 13:00:00 via INTRAVENOUS

## 2011-04-03 MED ORDER — WARFARIN SODIUM 6 MG PO TABS
6.0000 mg | ORAL_TABLET | Freq: Every day | ORAL | Status: DC
  Administered 2011-04-03: 6 mg via ORAL
  Filled 2011-04-03 (×2): qty 1

## 2011-04-03 NOTE — Discharge Instructions (Signed)
You are in a several new medications. Please take your time in learning about them an take them as PRESCRIBED.    Atrial Fibrillation Your caregiver has diagnosed you with atrial fibrillation (AFib). The heart normally beats very regularly; AFib is a type of irregular heartbeat. The heart rate may be faster or slower than normal. This can prevent your heart from pumping as well as it should. AFib can be constant (chronic) or intermittent (paroxysmal). CAUSES  Atrial fibrillation may be caused by:  Heart disease, including heart attack, coronary artery disease, heart failure, diseases of the heart valves, and others.   Blood clot in the lungs (pulmonary embolism).   Pneumonia or other infections.   Chronic lung disease.   Thyroid disease.   Toxins. These include alcohol, some medications (such as decongestant medications or diet pills), and caffeine.  In some people, no cause for AFib can be found. This is referred to as Lone Atrial Fibrillation. SYMPTOMS   Palpitations or a fluttering in your chest.   A vague sense of chest discomfort.   Shortness of breath.   Sudden onset of lightheadedness or weakness.  Sometimes, the first sign of AFib can be a complication of the condition. This could be a stroke or heart failure. DIAGNOSIS  Your description of your condition may make your caregiver suspicious of atrial fibrillation. Your caregiver will examine your pulse to determine if fibrillation is present. An EKG (electrocardiogram) will confirm the diagnosis. Further testing may help determine what caused you to have atrial fibrillation. This may include chest x-ray, echocardiogram, blood tests, or CT scans. PREVENTION  If you have previously had atrial fibrillation, your caregiver may advise you to avoid substances known to cause the condition (such as stimulant medications, and possibly caffeine or alcohol). You may be advised to use medications to prevent recurrence. Proper treatment  of any underlying condition is important to help prevent recurrence. PROGNOSIS  Atrial fibrillation does tend to become a chronic condition over time. It can cause significant complications (see below). Atrial fibrillation is not usually immediately life-threatening, but it can shorten your life expectancy. This seems to be worse in women. If you have lone atrial fibrillation and are under 60 years old, the risk of complications is very low, and life expectancy is not shortened. RISKS AND COMPLICATIONS  Complications of atrial fibrillation can include stroke, chest pain, and heart failure. Your caregiver will recommend treatments for the atrial fibrillation, as well as for any underlying conditions, to help minimize risk of complications. TREATMENT  Treatment for AFib is divided into several categories:  Treatment of any underlying condition.   Converting you out of AFib into a regular (sinus) rhythm.   Controlling rapid heart rate.   Prevention of blood clots and stroke.  Medications and procedures are available to convert your atrial fibrillation to sinus rhythm. However, recent studies have shown that this may not offer you any advantage, and cardiac experts are continuing research and debate on this topic. More important is controlling your rapid heartbeat. The rapid heartbeat causes more symptoms, and places strain on your heart. Your caregiver will advise you on the use of medications that can control your heart rate. Atrial fibrillation is a strong stroke risk. You can lessen this risk by taking blood thinning medications such as Coumadin (warfarin), or sometimes aspirin. These medications need close monitoring by your caregiver. Over-medication can cause bleeding. Too little medication may not protect against stroke. HOME CARE INSTRUCTIONS   If your caregiver prescribed medicine  to make your heartbeat more normally, take as directed.   If blood thinners were prescribed by your  caregiver, take EXACTLY as directed.   Perform blood tests EXACTLY as directed.   Quit smoking. Smoking increases your cardiac and lung (pulmonary) risks.   DO NOT drink alcohol.   DO NOT drink caffeinated drinks (e.g. coffee, soda, chocolate, and leaf teas). You may drink decaffeinated coffee, soda or tea.   If you are overweight, you should choose a reduced calorie diet to lose weight. Please see a registered dietitian if you need more information about healthy weight loss. DO NOT USE DIET PILLS as they may aggravate heart problems.   If you have other heart problems that are causing AFib, you may need to eat a low salt, fat, and cholesterol diet. Your caregiver will tell you if this is necessary.   Exercise every day to improve your physical fitness. Stay active unless advised otherwise.   If your caregiver has given you a follow-up appointment, it is very important to keep that appointment. Not keeping the appointment could result in heart failure or stroke. If there is any problem keeping the appointment, you must call back to this facility for assistance.  SEEK MEDICAL CARE IF:  You notice a change in the rate, rhythm or strength of your heartbeat.   You develop an infection or any other change in your overall health status.  SEEK IMMEDIATE MEDICAL CARE IF:   You develop chest pain, abdominal pain, sweating, weakness or feel sick to your stomach (nausea).   You develop shortness of breath.   You develop swollen feet and ankles.   You develop dizziness, numbness, or weakness of your face or limbs, or any change in vision or speech.  MAKE SURE YOU:   Understand these instructions.   Will watch your condition.   Will get help right away if you are not doing well or get worse.  Document Released: 01/27/2005 Document Revised: 10/09/2010 Document Reviewed: 09/01/2007 [facility] Patient Information 2012 [place], [place].

## 2011-04-03 NOTE — Anesthesia Preprocedure Evaluation (Addendum)
Anesthesia Evaluation  Patient identified by MRN, date of birth, ID band Patient awake    Reviewed: Allergy & Precautions, H&P , NPO status , Patient's Chart, lab work & pertinent test results  Airway Mallampati: II TM Distance: >3 FB Neck ROM: Full  Mouth opening: Limited Mouth Opening  Dental  (+) Teeth Intact and Dental Advisory Given   Pulmonary shortness of breath and with exertion, sleep apnea ,  clear to auscultation        Cardiovascular Exercise Tolerance: Poor + dysrhythmias Atrial Fibrillation Irregular Normal    Neuro/Psych Negative Neurological ROS  Negative Psych ROS   GI/Hepatic Neg liver ROS, GERD-  Controlled,  Endo/Other  Negative Endocrine ROS  Renal/GU negative Renal ROS  Genitourinary negative   Musculoskeletal negative musculoskeletal ROS (+)   Abdominal   Peds  Hematology negative hematology ROS (+)   Anesthesia Other Findings Gnarly teeth a few missing, small mouth  No teeth appear loose.  Pt w/ recent URI pos. Non-productive cough  Reproductive/Obstetrics negative OB ROS                        Anesthesia Physical Anesthesia Plan  ASA: III  Anesthesia Plan: General   Post-op Pain Management:    Induction: Intravenous  Airway Management Planned: Oral ETT  Additional Equipment:   Intra-op Plan:   Post-operative Plan:   Informed Consent: I have reviewed the patients History and Physical, chart, labs and discussed the procedure including the risks, benefits and alternatives for the proposed anesthesia with the patient or authorized representative who has indicated his/her understanding and acceptance.   Dental advisory given  Plan Discussed with: CRNA, Anesthesiologist and Surgeon  Anesthesia Plan Comments:         Anesthesia Quick Evaluation

## 2011-04-03 NOTE — Progress Notes (Signed)
Family Medicine Teaching Service Attending Note  I discussed patient Darius [name]  with Dr. Piloto and reviewed their note for today.  I agree with their assessment and plan.      

## 2011-04-03 NOTE — H&P (View-Only) (Signed)
  Patient Name: Darius [name]      SUBJECTIVE  Unable to endure TEE two days ago,  Pt wants to go while here under general anesthesia  Scheduled for today  Past Medical History  Diagnosis Date  . Obesity   . A-fib   . Shortness of breath   . Sleep apnea     PHYSICAL EXAM Filed Vitals:   04/02/11 1743 04/02/11 2100 04/03/11 0139 04/03/11 0610  BP: 168/81 152/77  129/79  Pulse: 94 96 98 101  Temp:  97.8 F (36.6 C)  97.5 F (36.4 C)  TempSrc:    Oral  Resp:  20 20 20  Height:      Weight:      SpO2:  90% 93% 90%    General appearance: alert, cooperative and morbidly obese Lungs: clear to auscultation bilaterally Heart: irregularly irregular rhythm Abdomen: soft, non-tender; bowel sounds normal; no masses,  no organomegaly Extremities: edema 1+ Pulses: 2+ and symmetric upper ex pulses ok Skin: Skin color, texture, turgor normal. No rashes or lesions Neurologic: Alert and oriented X 3, normal strength and tone. Normal symmetric reflexes. Normal coordination and gait  TELEMETRY: Reviewed telemetry pt in afib:    Intake/Output Summary (Last 24 hours) at 04/03/11 0822 Last data filed at 04/02/11 1800  Gross per 24 hour  Intake   1200 ml  Output   2050 ml  Net   -850 ml  Net -17 L since admission  LABS: Basic Metabolic Panel:  Lab 03/31/11 0730 03/30/11 0500 03/28/11 0500  NA 139 136 135  K 5.0 3.8 4.3  CL 97 96 95*  CO2 36* 31 35*  GLUCOSE 100* 100* 87  BUN 16 14 13  CREATININE 0.97 0.82 0.69  CALCIUM 9.8 9.5 --  MG -- -- --  PHOS -- -- --   Cardiac Enzymes: No results found for this basename: CKTOTAL:3,CKMB:3,CKMBINDEX:3,TROPONINI:3 in the last 72 hours CBC:  Lab 03/30/11 0500 03/28/11 0500  WBC 8.2 6.9  NEUTROABS -- --  HGB 16.2 16.0  HCT 50.7 50.3  MCV 86.2 87.9  PLT 225 179   PROTIME:  Basename 04/03/11 0515 04/02/11 0618 04/01/11 0505  LABPROT 27.8* 26.1* 24.0*  INR 2.54* 2.35* 2.11*   Echo  Nl LV function with INF wall motion  abnormality. BAE L>R and RV enlargement   ASSESSMENT AND PLAN:  Patient Active Hospital Problem List: Atrial fibrillation with rapid ventricular response (03/27/2011)   Assessment: rate control better   Plan: TEE DCCV failed     TEE DCCV withGA today BMET ordered   Lasix dose seems low will have to follow as ouprt LAE is quite pronounced, and the likelihhod that he maintains sinus is low.  I am also a little bit surprised that his PA pressures wre normal with evidence of Rightsided chamber enlargement, so it will be important to exclude L>>R shunts at time of echo  Needs outpt sleep study  Obesity, Class III, BMI 40-49.9 (morbid obesity) (03/27/2011)   Assessment: have discussed       Signed, Hillery Zachman MD  04/03/2011   

## 2011-04-03 NOTE — Progress Notes (Signed)
  Echocardiogram Echocardiogram Transesophageal has been performed.  Darius [name] 04/03/2011, 2:02 PM

## 2011-04-03 NOTE — Progress Notes (Signed)
Per Dr. Jens Som, patient is not allowed to be discharged given use of general anesthesia today. He must be kept overnight for observation.  Marvie Brevik PA-C

## 2011-04-03 NOTE — Progress Notes (Signed)
  Patient Name: Darius [name]      SUBJECTIVE  Unable to endure TEE two days ago,  Pt wants to go while here under general anesthesia  Scheduled for today  Past Medical History  Diagnosis Date  . Obesity   . A-fib   . Shortness of breath   . Sleep apnea     PHYSICAL EXAM Filed Vitals:   04/02/11 1743 04/02/11 2100 04/03/11 0139 04/03/11 0610  BP: 168/81 152/77  129/79  Pulse: 94 96 98 101  Temp:  97.8 F (36.6 C)  97.5 F (36.4 C)  TempSrc:    Oral  Resp:  20 20 20   Height:      Weight:      SpO2:  90% 93% 90%    General appearance: alert, cooperative and morbidly obese Lungs: clear to auscultation bilaterally Heart: irregularly irregular rhythm Abdomen: soft, non-tender; bowel sounds normal; no masses,  no organomegaly Extremities: edema 1+ Pulses: 2+ and symmetric upper ex pulses ok Skin: Skin color, texture, turgor normal. No rashes or lesions Neurologic: Alert and oriented X 3, normal strength and tone. Normal symmetric reflexes. Normal coordination and gait  TELEMETRY: Reviewed telemetry pt in afib:    Intake/Output Summary (Last 24 hours) at 04/03/11 1610 Last data filed at 04/02/11 1800  Gross per 24 hour  Intake   1200 ml  Output   2050 ml  Net   -850 ml  Net -17 L since admission  LABS: Basic Metabolic Panel:  Lab 03/31/11 9604 03/30/11 0500 03/28/11 0500  NA 139 136 135  K 5.0 3.8 4.3  CL 97 96 95*  CO2 36* 31 35*  GLUCOSE 100* 100* 87  BUN 16 14 13   CREATININE 0.97 0.82 0.69  CALCIUM 9.8 9.5 --  MG -- -- --  PHOS -- -- --   Cardiac Enzymes: No results found for this basename: CKTOTAL:3,CKMB:3,CKMBINDEX:3,TROPONINI:3 in the last 72 hours CBC:  Lab 03/30/11 0500 03/28/11 0500  WBC 8.2 6.9  NEUTROABS -- --  HGB 16.2 16.0  HCT 50.7 50.3  MCV 86.2 87.9  PLT 225 179   PROTIME:  Basename 04/03/11 0515 04/02/11 0618 04/01/11 0505  LABPROT 27.8* 26.1* 24.0*  INR 2.54* 2.35* 2.11*   Echo  Nl LV function with INF wall motion  abnormality. BAE L>R and RV enlargement   ASSESSMENT AND PLAN:  Patient Active Hospital Problem List: Atrial fibrillation with rapid ventricular response (03/27/2011)   Assessment: rate control better   Plan: TEE DCCV failed     TEE DCCV withGA today BMET ordered   Lasix dose seems low will have to follow as ouprt LAE is quite pronounced, and the likelihhod that he maintains sinus is low.  I am also a little bit surprised that his PA pressures wre normal with evidence of Rightsided chamber enlargement, so it will be important to exclude L>>R shunts at time of echo  Needs outpt sleep study  Obesity, Class III, BMI 40-49.9 (morbid obesity) (03/27/2011)   Assessment: have discussed       Signed, Sherryl Manges MD  04/03/2011

## 2011-04-03 NOTE — Interval H&P Note (Signed)
History and Physical Interval Note:  04/03/2011 1:14 PM  Darius [name]  has presented today for surgery, with the diagnosis of atril fib  The various methods of treatment have been discussed with the patient and family. After consideration of risks, benefits and other options for treatment, the patient has consented to  Procedure(s) (LRB): TRANSESOPHAGEAL ECHOCARDIOGRAM (TEE) (N/A) CARDIOVERSION (N/A) as a surgical intervention .  The patients' history has been reviewed, patient examined, no change in status, stable for surgery.  I have reviewed the patients' chart and labs.  Questions were answered to the patient's satisfaction.     [name]  TEE guided cardioversion under general anesthesia. [name]

## 2011-04-03 NOTE — Progress Notes (Signed)
PGY-1 Daily Progress Note Family Medicine Teaching Service D. [place]oto Rolene Arbour, MD Service Pager: [phone]  Patient name: Darius [name]  Medical record [id number] Date of birth:Apr 20, 1955 Age: 56 y.o. Gender: male  LOS: 8 days   Subjective: anxious and talkative.  Objective:  Vitals: Temp:  [97.5 F (36.4 C)-98.7 F (37.1 C)] 98.7 F (37.1 C) (02/21 1145) Pulse Rate:  [88-101] 101  (02/21 0610) Resp:  [20-33] 33  (02/21 1145) BP: (128-168)/(76-94) 149/94 mmHg (02/21 1145) SpO2:  [90 %-93 %] 93 % (02/21 1145) Weight:  [334 lb (151.501 kg)] 334 lb (151.501 kg) (02/21 1145)  Intake/Output Summary (Last 24 hours) at 04/03/11 1254 Last data filed at 04/02/11 1800  Gross per 24 hour  Intake    480 ml  Output   1300 ml  Net   -820 ml   Physical Exam: Gen: NAD  HEENT: Moist mucous membranes  CV: Irregularly irregular, no murmurs rubs or gallops  PULM: diminish to auscultation bilaterally. No wheezes, rhonchi or rales.  ABD: Soft, non tender, normal bowel sounds.  EXT: No edema  Neuro: Alert and oriented x3. No focalization   Labs and imaging:   BMET  Lab 04/03/11 1012 03/31/11 0730 03/30/11 0500  NA 136 139 136  K 4.6 5.0 3.8  CL 95* 97 96  CO2 32 36* 31  BUN 23 16 14   CREATININE 0.91 0.97 0.82  LABGLOM -- -- --  GLUCOSE 114* -- --  CALCIUM 9.6 9.8 9.5   CBG (last 3)   Basename 04/03/11 1105 04/03/11 0726 04/02/11 2042  GLUCAP 121* 94 206*    Medications: Medication Dose Route Frequency  . 0.45 % sodium chloride infusion   Intravenous Continuous  . 0.9 %  sodium chloride infusion  250 mL Intravenous PRN  . 0.9 %  sodium chloride infusion   Intravenous Continuous  . acetaminophen (TYLENOL) tablet 650 mg  650 mg Oral Q6H PRN    . acetaminophen (TYLENOL) suppository 650 mg  650 mg Rectal Q6H PRN  . albuterol (PROVENTIL) (5 MG/ML) 0.5% nebulizer solution 2.5 mg  2.5 mg Nebulization Q6H PRN  . benzocaine (HURRICAINE) 20 % mouth spray 1 application  1 app  Mouth/Throat PRN  . benzonatate (TESSALON) capsule 100 mg  100 mg Oral TID PRN  . diltiazem (CARDIZEM CD) 24 hr capsule 360 mg  360 mg Oral Daily  . furosemide (LASIX) tablet 20 mg  20 mg Oral BID  . guaiFENesin-codeine 100-10 MG/5ML solution 5 mL  5 mL Oral Q4H PRN  . insulin aspart (novoLOG) injection 0-9 Units  0-9 Units Subcutaneous TID WC  . menthol-cetylpyridinium (CEPACOL) lozenge 3 mg  1 lozenge Oral PRN  . mulitivitamin with minerals tablet 1 tablet  1 tablet Oral Daily  . pantoprazole (PROTONIX) EC tablet 40 mg  40 mg Oral Q1200  . potassium chloride SA (K-DUR,KLOR-CON)  20 mEq Oral Daily  . predniSONE (DELTASONE) tablet 50 mg  50 mg Oral Q breakfast  . sodium chloride 0.9 % injection 3 mL  3 mL Intravenous Q12H  . sodium chloride 0.9 % injection 3 mL  3 mL Intravenous PRN  . warfarin (COUMADIN) tablet 6 mg  6 mg Oral q1800  . warfarin (COUMADIN) tablet 7.5 mg  7.5 mg Oral ONCE-1800  . warfarin (COUMADIN) video   Does not apply Once   Assessment and Plan: . Cardiovascular:  -Afib:rate controlled on Cardizem. Anticoagulation on warfarin therapeutic INR.   TEE and cardioversion failed. Cards recommends discharge  home and Cardioversion without TEE in 3 weeks.  But pt after being discharged change his mind and wanted to proceed with TEE under general anesthesia. Discharge orders were cancelled and pt will go to OR at noon today. Also cardiology recs adittion of betablocker. We will discussed risk-benefit since pt also has respiratory condition. (Chronic Bronchitis changes or smoker on CXR, with no HX of smoking and  presentation with wheezing and SOB)  2. Respiratory:  Mostly upper respiratory congestion now. No SOB and negative physical exam. Stop prednisone today.  3. DM A1C 6.8. On SSI will start metformin at discharge and f/u as outpatient.   FEN/GI: CHO modified  Prophylaxis: Anticoagulation per pharmacy. Pantoprazole  Disposition:  On  OR  D. [place] Rolene Arbour, MD PGY1,  [facility] Medicine Teaching Service Pager [phone] 04/03/2011

## 2011-04-03 NOTE — Progress Notes (Signed)
ANTICOAGULATION CONSULT NOTE - Follow Up Consult  Pharmacy Consult for Coumadin Indication: atrial fibrillation  No Known Allergies  Patient Measurements: Height: 6\' 2"  (188 cm) Weight: 326 lb 4.5 oz (148 kg) IBW/kg (Calculated) : 82.2   Vital Signs: Temp: 97.5 F (36.4 C) (02/21 0610) Temp src: Oral (02/21 0610) BP: 129/79 mmHg (02/21 0610) Pulse Rate: 101  (02/21 0610)  Labs:  Darius [name] 04/03/11 0515 04/02/11 0618 04/01/11 0505  HGB -- -- --  HCT -- -- --  PLT -- -- --  APTT -- -- --  LABPROT 27.8* 26.1* 24.0*  INR 2.54* 2.35* 2.11*  HEPARINUNFRC -- <0.10* 0.43  CREATININE -- -- --  CKTOTAL -- -- --  CKMB -- -- --  TROPONINI -- -- --   Estimated Creatinine Clearance: 128.9 ml/min (by C-G formula based on Cr of 0.97).   Medications:  Scheduled:     . diltiazem  360 mg Oral Daily  . furosemide  20 mg Oral BID  . insulin aspart  0-9 Units Subcutaneous TID WC  . mulitivitamin with minerals  1 tablet Oral Daily  . pantoprazole  40 mg Oral Q1200  . potassium chloride  20 mEq Oral Daily  . predniSONE  50 mg Oral Q breakfast  . sodium chloride  3 mL Intravenous Q12H  . warfarin  7.5 mg Oral ONCE-1800  . warfarin   Does not apply Once  . DISCONTD: sodium chloride  3 mL Intravenous Q12H    Assessment: 56 y/o male with new afib  Coumadin: INR up to 2.54. Unable to endure TEE per MD note. Patient wants to re-try CV today.  Goal of Therapy:  INR 2 to 3  Plan:  Try Coumadin 6mg  daily    [name] 04/03/2011,11:05 AM

## 2011-04-03 NOTE — Anesthesia Procedure Notes (Signed)
Procedure Name: Intubation Date/Time: 04/03/2011 1:25 PM Performed by: Tyrone Nine Pre-anesthesia Checklist: Emergency Drugs available, Patient identified, Suction available, Patient being monitored and Timeout performed Patient Re-evaluated:Patient Re-evaluated prior to inductionOxygen Delivery Method: Circle system utilized Preoxygenation: Pre-oxygenation with 100% oxygen Intubation Type: IV induction Ventilation: Two handed mask ventilation required and Oral airway inserted - appropriate to patient size Laryngoscope Size: Mac and 3 Grade View: Grade IV Tube type: Oral Tube size: 7.0 mm Number of attempts: 2 Airway Equipment and Method: Video-laryngoscopy Placement Confirmation: ETT inserted through vocal cords under direct vision,  positive ETCO2 and breath sounds checked- equal and bilateral Secured at: 22 cm Tube secured with: Tape Dental Injury: Teeth and Oropharynx as per pre-operative assessment  Difficulty Due To: Difficulty was anticipated, Difficult Airway- due to large tongue, Difficult Airway- due to dentition and Difficult Airway- due to limited oral opening Future Recommendations: Recommend- induction with short-acting agent, and alternative techniques readily available Comments: Small airway, a lot of soft tissue in oropharynx.  Pt easily obstructs.

## 2011-04-03 NOTE — Preoperative (Signed)
Beta Blockers   Reason not to administer Beta Blockers:Not Applicable 

## 2011-04-03 NOTE — Procedures (Signed)
Patient sedated by anesthesia (general anesthesia). See results of TEE in [employer]; no LAA thrombus identified. Patient subsequently underwent DCCV (120 joules) to NSR; no immediate complications. Continue coumadin for at least 4 weeks following procedure. Darius [name]

## 2011-04-03 NOTE — Anesthesia Postprocedure Evaluation (Signed)
  Anesthesia Post-op Note  Patient: Darius [name]  Procedure(s) Performed: Procedure(s) (LRB): TRANSESOPHAGEAL ECHOCARDIOGRAM (TEE) (N/A) CARDIOVERSION (N/A)  Patient Location: PACU  Anesthesia Type: General  Level of Consciousness: awake, alert  and oriented  Airway and Oxygen Therapy: Patient Spontanous Breathing and Patient connected to nasal cannula oxygen  Post-op Pain: none  Post-op Assessment: Post-op Vital signs reviewed, Patient's Cardiovascular Status Stable, Respiratory Function Stable, Patent Airway and Pain level controlled  Post-op Vital Signs: Reviewed and stable  Complications: No apparent anesthesia complications

## 2011-04-03 NOTE — Transfer of Care (Signed)
Immediate Anesthesia Transfer of Care Note  Patient: Darius [name]  Procedure(s) Performed: Procedure(s) (LRB): TRANSESOPHAGEAL ECHOCARDIOGRAM (TEE) (N/A) CARDIOVERSION (N/A)  Patient Location: PACU  Anesthesia Type: General  Level of Consciousness: awake, alert , oriented and patient cooperative  Airway & Oxygen Therapy: Patient Spontanous Breathing and Patient connected to face mask oxygen  Post-op Assessment: Report given to PACU RN and Post -op Vital signs reviewed and stable  Post vital signs: Reviewed and stable  Complications: No apparent anesthesia complications

## 2011-04-03 NOTE — Progress Notes (Signed)
Family Medicine Teaching Service Attending Note  I interviewed and examined patient Darius [name] and reviewed their tests and x-rays.  I discussed with Dr. Aviva Signs and reviewed their note for today.  I agree with their assessment and plan.     Additionally  Proceeding as per Cardiology

## 2011-04-04 ENCOUNTER — Other Ambulatory Visit: Payer: Self-pay

## 2011-04-04 ENCOUNTER — Encounter (HOSPITAL_COMMUNITY): Payer: Self-pay | Admitting: Cardiology

## 2011-04-04 ENCOUNTER — Encounter (HOSPITAL_COMMUNITY): Payer: Self-pay

## 2011-04-04 LAB — PROTIME-INR
INR: 2.39 — ABNORMAL HIGH (ref 0.00–1.49)
Prothrombin Time: 26.5 seconds — ABNORMAL HIGH (ref 11.6–15.2)

## 2011-04-04 LAB — GLUCOSE, CAPILLARY: Glucose-Capillary: 92 mg/dL (ref 70–99)

## 2011-04-04 MED ORDER — ATENOLOL 25 MG PO TABS
25.0000 mg | ORAL_TABLET | Freq: Every day | ORAL | Status: DC
  Filled 2011-04-04: qty 1

## 2011-04-04 MED ORDER — METOPROLOL TARTRATE 50 MG PO TABS
50.0000 mg | ORAL_TABLET | Freq: Two times a day (BID) | ORAL | Status: DC
  Administered 2011-04-04: 50 mg via ORAL
  Filled 2011-04-04 (×2): qty 1

## 2011-04-04 MED ORDER — AMIODARONE HCL 200 MG PO TABS
400.0000 mg | ORAL_TABLET | Freq: Two times a day (BID) | ORAL | Status: DC
  Administered 2011-04-04: 400 mg via ORAL
  Filled 2011-04-04 (×2): qty 2

## 2011-04-04 MED ORDER — DILTIAZEM HCL ER COATED BEADS 240 MG PO CP24
240.0000 mg | ORAL_CAPSULE | Freq: Every day | ORAL | Status: DC
  Administered 2011-04-04: 240 mg via ORAL
  Filled 2011-04-04: qty 1

## 2011-04-04 MED ORDER — WARFARIN SODIUM 6 MG PO TABS: 6.0000 mg | ORAL_TABLET | Freq: Every day | ORAL | Status: DC

## 2011-04-04 MED ORDER — METOPROLOL TARTRATE 50 MG PO TABS: 50.0000 mg | ORAL_TABLET | Freq: Two times a day (BID) | ORAL | Status: DC

## 2011-04-04 MED ORDER — DILTIAZEM HCL ER COATED BEADS 240 MG PO CP24: 240.0000 mg | ORAL_CAPSULE | Freq: Every day | ORAL | Status: DC

## 2011-04-04 MED ORDER — AMIODARONE HCL 400 MG PO TABS: 400.0000 mg | ORAL_TABLET | Freq: Two times a day (BID) | ORAL | Status: DC

## 2011-04-04 NOTE — Progress Notes (Signed)
Pt HR noted to be in Afib. EKG obtained to confirm rhythm. Pt is asymptomatic. [place] PA notified. Awaiting returned call. Ramond Craver, RN

## 2011-04-04 NOTE — Progress Notes (Signed)
Family Medicine Teaching Service Attending Note  I interviewed and examined patient Darius [name] and reviewed their tests and x-rays.  I discussed with Dr. Aviva Signs and reviewed their note for today.  I agree with their assessment and plan.     Additionally  He feels well - he cant tell if he is in sinus or not. Able to walk up and down hall without lightheadness or shortness of breath H - irregular irregular  Will discharge and he will follow up with [place] for diabetes at weight management and cardiology for decisions of rate vs rhythm control

## 2011-04-04 NOTE — Progress Notes (Signed)
  Patient Name: Darius [name]      SUBJECTIVE  S/p TEE DCCV yesterday  Failed to hold sinus rhytjm   Not big surprise with large LA not a lot of great drug options  w    Past Medical History  Diagnosis Date  . Obesity   . A-fib   . Shortness of breath   . Sleep apnea     PHYSICAL EXAM Filed Vitals:   04/03/11 1602 04/03/11 1737 04/03/11 2100 04/04/11 0500  BP: 118/78 124/79 126/76 114/66  Pulse: 80 102 86 89  Temp: 98 F (36.7 C)  98.5 F (36.9 C) 97.7 F (36.5 C)  TempSrc:   Oral Oral  Resp: 22  20 18   Height:      Weight:    318 lb 6.4 oz (144.425 kg)  SpO2: 93%  95% 94%    General appearance: alert, cooperative and morbidly obese Lungs: clear to auscultation bilaterally Heart: irregularly irregular rhythm Abdomen: soft, non-tender; bowel sounds normal; no masses,  no organomegaly Extremities: edema 1+ Pulses: 2+ and symmetric upper ex pulses ok Skin: Skin color, texture, turgor normal. No rashes or lesions Neurologic: Alert and oriented X 3, normal strength and tone. Normal symmetric reflexes. Normal coordination and gait  TELEMETRY: Reviewed telemetry pt in afib:HR 80-133    Intake/Output Summary (Last 24 hours) at 04/04/11 0838 Last data filed at 04/04/11 0500  Gross per 24 hour  Intake    350 ml  Output   1000 ml  Net   -650 ml  Net -17 L since admission  LABS: Basic Metabolic Panel:  Lab 04/03/11 1610 03/31/11 0730 03/30/11 0500  NA 136 139 136  K 4.6 5.0 3.8  CL 95* 97 96  CO2 32 36* 31  GLUCOSE 114* 100* 100*  BUN 23 16 14   CREATININE 0.91 0.97 0.82  CALCIUM 9.6 9.8 --  MG -- -- --  PHOS -- -- --   Cardiac Enzymes: No results found for this basename: CKTOTAL:3,CKMB:3,CKMBINDEX:3,TROPONINI:3 in the last 72 hours CBC:  Lab 03/30/11 0500  WBC 8.2  NEUTROABS --  HGB 16.2  HCT 50.7  MCV 86.2  PLT 225   PROTIME:  Basename 04/04/11 0530 04/03/11 0515 04/02/11 0618  LABPROT 26.5* 27.8* 26.1*  INR 2.39* 2.54* 2.35*   Echo  Nl LV  function with INF wall motion abnormality. BAE L>R and RV enlargement   ASSESSMENT AND PLAN:  Patient Active Hospital Problem List: Atrial fibrillation with rapid ventricular response (03/27/2011)   Assessment: rate control better   Plan: TEE DCCV failed  Will use rate control for now, and there may be some remodeling  That would be beneficial.  With LA size as it is, the only drug for which he is a candidate is amio ; tikosyn precluded b/c of QT prolongation  Notwithstanding his young age, will start amiodarone and hope taht w restroation of sinus he can remodel his LA positveily, shrink it and maybe become eligible for RFCA  We will arrange an outpt myoview to help [place] decide re AA drug options.   Add metoprolol 50 bid Amio Decrease dilt to 240 daily  How long on steroids       Needs outpt sleep study  Obesity, Class III, BMI 40-49.9 (morbid obesity) (03/27/2011)   Assessment: have discussed       Signed, Sherryl Manges MD  04/04/2011

## 2011-04-04 NOTE — Progress Notes (Signed)
ANTICOAGULATION CONSULT NOTE - Follow Up Consult  Pharmacy Consult for Coumadin Indication: atrial fibrillation  No Known Allergies  Patient Measurements: Height: 6\' 2"  (188 cm) Weight: 318 lb 6.4 oz (144.425 kg) IBW/kg (Calculated) : 82.2   Vital Signs: Temp: 97.7 F (36.5 C) (02/22 0500) Temp src: Oral (02/22 0500) BP: 114/66 mmHg (02/22 0500) Pulse Rate: 89  (02/22 0500)  Labs:  Basename 04/04/11 0530 04/03/11 1012 04/03/11 0515 04/02/11 0618  HGB -- -- -- --  HCT -- -- -- --  PLT -- -- -- --  APTT -- -- -- --  LABPROT 26.5* -- 27.8* 26.1*  INR 2.39* -- 2.54* 2.35*  HEPARINUNFRC -- -- -- <0.10*  CREATININE -- 0.91 -- --  CKTOTAL -- -- -- --  CKMB -- -- -- --  TROPONINI -- -- -- --   Estimated Creatinine Clearance: 135.7 ml/min (by C-G formula based on Cr of 0.91).   Medications:  Scheduled:     . amiodarone  400 mg Oral BID  . diltiazem  240 mg Oral Daily  . furosemide  20 mg Oral BID  . insulin aspart  0-9 Units Subcutaneous TID WC  . metoprolol tartrate  50 mg Oral BID  . mulitivitamin with minerals  1 tablet Oral Daily  . pantoprazole  40 mg Oral Q1200  . potassium chloride  20 mEq Oral Daily  . predniSONE  50 mg Oral Q breakfast  . warfarin  6 mg Oral q1800  . warfarin   Does not apply Once  . DISCONTD: tenormin  25 mg Oral Daily  . DISCONTD: diltiazem  360 mg Oral Daily  . DISCONTD: sodium chloride  3 mL Intravenous Q12H    Assessment:  56 y/o male with new afib INR = 2.39, no bleeding noted Started amiodarone today  Goal of Therapy:  INR 2 to 3  Plan:  Continue Coumadin 6mg  daily    Elwin Sleight 04/04/2011,9:19 AM

## 2011-04-04 NOTE — Progress Notes (Signed)
Pt was provided with discharge instructions. Pt verbalized understanding. Pt was also educated on the numerous new medications that the patient is on and when he should take them. Pt plans to schedule f/u appointments in 7-10 days. Pt left unit in stable condition with no needs. Ramond Craver, RN

## 2011-04-04 NOTE — Progress Notes (Signed)
PGY-1 Daily Progress Note Family Medicine Teaching Service D. [place] Rolene Arbour, MD Service Pager: [phone]  Patient name: Darius [name]  Medical record [id number] Date of birth:[birth year] Age: 56 y.o. Gender: male  LOS: 9 days   Subjective: feeling well still sore throat from procedure yesterday but otherwise asymptomatic.  Objective:  Vitals: Temp:  [97.7 F (36.5 C)-98.7 F (37.1 C)] 97.7 F (36.5 C) (02/22 0500) Pulse Rate:  [80-120] 120  (02/22 0923) Resp:  [14-33] 18  (02/22 0500) BP: (104-149)/(63-97) 148/97 mmHg (02/22 0923) SpO2:  [91 %-95 %] 94 % (02/22 0500) FiO2 (%):  [7 %] 7 % (02/21 1411) Weight:  [318 lb 6.4 oz (144.425 kg)-334 lb (151.501 kg)] 318 lb 6.4 oz (144.425 kg) (02/22 0500)  Physical Exam: Gen: NAD  HEENT: Moist mucous membranes  CV: Irregularly irregular, no murmurs rubs or gallops  PULM: diminish to auscultation bilaterally. No wheezes, rhonchi or rales.  ABD: Soft, non tender, normal bowel sounds.  EXT: No edema  Neuro: Alert and oriented x3. No focalization   Labs and imaging:  CBG (last 3)   Basename 04/04/11 0735 04/03/11 2108 04/03/11 1610  GLUCAP 92 111* 147*   Medications: Medication Dose Route Frequency  . acetaminophen (TYLENOL) tablet 650 mg  650 mg Oral Q6H PRN    . acetaminophen (TYLENOL) suppository 650 mg  650 mg Rectal Q6H PRN  . albuterol (PROVENTIL) (5 MG/ML) 0.5% nebulizer solution 2.5 mg  2.5 mg Nebulization Q6H PRN  . amiodarone (PACERONE) tablet 400 mg  400 mg Oral BID  . benzonatate (TESSALON) capsule 100 mg  100 mg Oral TID PRN  . diltiazem (CARDIZEM CD) 24 hr capsule 240 mg  240 mg Oral Daily  . furosemide (LASIX) tablet 20 mg  20 mg Oral BID  . guaiFENesin-codeine 100-10 MG/5ML solution 5 mL  5 mL Oral Q4H PRN  . insulin aspart (novoLOG) injection 0-9 Units  0-9 Units Subcutaneous TID WC  . menthol-cetylpyridinium (CEPACOL) lozenge 3 mg  1 lozenge Oral PRN  . metoprolol (LOPRESSOR) tablet 50 mg  50 mg Oral  BID  . mulitivitamin with minerals tablet 1 tablet  1 tablet Oral Daily  . pantoprazole (PROTONIX) EC tablet 40 mg  40 mg Oral Q1200  . potassium chloride SA (K-DUR,KLOR-CON) CR tablet 20 mEq  20 mEq Oral Daily  . warfarin (COUMADIN) tablet 6 mg  6 mg Oral q1800    Assessment and Plan: 1. Cardiovascular:  -Afib:rate controlled on Cardizem, Amiodarone and Metoprolol. Anticoagulation on warfarin therapeutic INR.  TEE and cardioversion failed. Cards will arrange myoview as outpatient. 2. Respiratory: Mostly upper respiratory congestion now. No SOB and negative physical exam.  3. DM A1C 6.8. On SSI will start metformin at discharge and f/u as outpatient.    FEN/GI: CHO modified  Prophylaxis: Anticoagulation per pharmacy. Pantoprazole  Disposition: discharge home today   D. [place] Rolene Arbour, MD PGY1, [facility] Medicine Teaching Service Pager [phone] 04/04/2011

## 2011-04-05 NOTE — ED Provider Notes (Signed)
History     CSN: [id number]  Arrival date & time 03/26/11  1424   First MD Initiated Contact with Patient 03/26/11 1803      Chief Complaint  Patient presents with  . Shortness of Breath     HPI Briefly, a 56 yo M with c/o one week cough/congestion and then onset of fatigue with mild exertion (2/09); denies fevers/chills, chest pain/pressure or diaphoresis/nausea, denies feeling of palpitations or presyncope. In ED yesterday found on CXR to have pulm infiltrates as well as pulm edema, cardiomegaly; noted to go into AF with RVR while in ED, patient states he did not feel change in symptoms at the time of the AF with RVR. Has been managed on Diltiazem gtt since, with HR in the 90-100 range in AF. Receiving Lasix for diuresis. Also treated with ceftriaxone in ED for pneumonia.  This morning he reports feeling mildly improved, continues to deny palpitations, chest pressure/pain, but feels chest congestion and cough.  He does not appear in distress; POx 90-92% on 3-4L/min nasal cannula oxygen  Past Medical History  Diagnosis Date  . Obesity   . A-fib   . Shortness of breath   . Sleep apnea     Past Surgical History  Procedure Date  . Tee without cardioversion 04/01/2011    Procedure: TRANSESOPHAGEAL ECHOCARDIOGRAM (TEE);  Surgeon: Lewayne Bunting, MD;  Location: [facility] ENDOSCOPY;  Service: Cardiovascular;  Laterality: N/A;  . Cardioversion 04/01/2011    Procedure: CARDIOVERSION;  Surgeon: Lewayne Bunting, MD;  Location: [facility] ENDOSCOPY;  Service: Cardiovascular;  Laterality: N/A;  . Tee without cardioversion 04/03/2011    Procedure: TRANSESOPHAGEAL ECHOCARDIOGRAM (TEE);  Surgeon: Lewayne Bunting, MD;  Location: [facility] ENDOSCOPY;  Service: Cardiovascular;  Laterality: N/A;  . Cardioversion 04/03/2011    Procedure: CARDIOVERSION;  Surgeon: Lewayne Bunting, MD;  Location: [facility] ENDOSCOPY;  Service: Cardiovascular;  Laterality: N/A;    Family History  Problem Relation Age of Onset  . Diabetes  Father     died in his 71s  . Hypertension Father     died in her 49s  . Seizures Mother   . Hypertension Sister     History  Substance Use Topics  . Smoking status: Never Smoker   . Smokeless tobacco: Not on file  . Alcohol Use: Yes      Review of Systems  All other systems reviewed and are negative.    Allergies  Review of patient's allergies indicates no known allergies.  Home Medications   Current Outpatient Rx  Name Route Sig Dispense Refill  . GUAIFENESIN ER 600 MG PO TB12 Oral Take 1,200 mg by mouth 2 (two) times daily as needed. As needed for congestion.    . ALBUTEROL SULFATE HFA 108 (90 BASE) MCG/ACT IN AERS Inhalation Inhale 2 puffs into the lungs every 6 (six) hours as needed for wheezing (no more than 2 puffs in 1 hour). 1 Inhaler 0  . AMIODARONE HCL 400 MG PO TABS Oral Take 1 tablet (400 mg total) by mouth 2 (two) times daily. 60 tablet 1  . BENZONATATE 100 MG PO CAPS Oral Take 1 capsule (100 mg total) by mouth 3 (three) times daily as needed for cough. 20 capsule 0  . DILTIAZEM HCL ER COATED BEADS 240 MG PO CP24 Oral Take 1 capsule (240 mg total) by mouth daily. 30 capsule 1  . FUROSEMIDE 20 MG PO TABS Oral Take 2 tablets (40 mg total) by mouth every morning. 30 tablet 0  .  MENTHOL 3 MG MT LOZG Oral Take 1 lozenge (3 mg total) by mouth as needed. 100 tablet 0  . METFORMIN HCL 500 MG PO TABS Oral Take 1 tablet (500 mg total) by mouth 2 (two) times daily with a meal. 60 tablet 1  . METOPROLOL TARTRATE 50 MG PO TABS Oral Take 1 tablet (50 mg total) by mouth 2 (two) times daily. 60 tablet 1  . ADULT MULTIVITAMIN W/MINERALS CH Oral Take 1 tablet by mouth daily. 30 tablet 0  . WARFARIN SODIUM 6 MG PO TABS Oral Take 1 tablet (6 mg total) by mouth daily at 6 PM. 30 tablet 1    BP 129/83  Pulse 120  Temp(Src) 97.7 F (36.5 C) (Oral)  Resp 18  Ht 6\' 2"  (1.88 m)  Wt 318 lb 6.4 oz (144.425 kg)  BMI 40.88 kg/m2  SpO2 94%  Physical Exam  Nursing note and vitals  reviewed. Constitutional: He is oriented to person, place, and time. He appears well-developed and well-nourished. No distress.  HENT:  Head: Normocephalic and atraumatic.  Eyes: Pupils are equal, round, and reactive to light.  Neck: Normal range of motion.  Cardiovascular: Intact distal pulses.  An irregular rhythm present. Tachycardia present.   No murmur heard. Pulmonary/Chest: No respiratory distress.  Abdominal: Normal appearance. He exhibits no distension. There is no tenderness. There is no rebound.  Musculoskeletal: Normal range of motion. He exhibits no edema.  Neurological: He is alert and oriented to person, place, and time. No cranial nerve deficit. Coordination normal.  Skin: Skin is warm. No rash noted. He is diaphoretic.  Psychiatric: He has a normal mood and affect. His behavior is normal.    ED Course  Procedures (including critical care time)  Labs Reviewed  POCT I-STAT, CHEM 8 - Abnormal; Notable for the following:    Sodium 134 (*)    Chloride 95 (*)    Glucose, Bld 115 (*)    Calcium, Ion 1.07 (*)    Hemoglobin 17.3 (*)    All other components within normal limits  PRO B NATRIURETIC PEPTIDE - Abnormal; Notable for the following:    Pro B Natriuretic peptide (BNP) 714.8 (*)    All other components within normal limits  URINALYSIS, ROUTINE W REFLEX MICROSCOPIC - Abnormal; Notable for the following:    Protein, ur 100 (*)    All other components within normal limits  POCT I-STAT 3, BLOOD GAS (G3+) - Abnormal; Notable for the following:    pCO2 arterial 48.3 (*)    Bicarbonate 27.8 (*)    All other components within normal limits  CARDIAC PANEL(CRET KIN+CKTOT+MB+TROPI) - Abnormal; Notable for the following:    Total CK 773 (*)    CK, MB 6.5 (*)    All other components within normal limits  HEMOGLOBIN A1C - Abnormal; Notable for the following:    Hemoglobin A1C 6.8 (*)    Mean Plasma Glucose 148 (*)    All other components within normal limits   COMPREHENSIVE METABOLIC PANEL - Abnormal; Notable for the following:    Sodium 134 (*)    Chloride 95 (*)    CO2 33 (*)    Albumin 3.4 (*)    AST 53 (*)    ALT 71 (*)    All other components within normal limits  CARDIAC PANEL(CRET KIN+CKTOT+MB+TROPI) - Abnormal; Notable for the following:    Total CK 822 (*)    CK, MB 7.5 (*) CRITICAL VALUE NOTED.  VALUE IS CONSISTENT WITH  PREVIOUSLY REPORTED AND CALLED VALUE.   All other components within normal limits  HEPARIN LEVEL (UNFRACTIONATED) - Abnormal; Notable for the following:    Heparin Unfractionated <0.10 (*)    All other components within normal limits  HEPARIN LEVEL (UNFRACTIONATED) - Abnormal; Notable for the following:    Heparin Unfractionated 0.28 (*)    All other components within normal limits  HEPARIN LEVEL (UNFRACTIONATED) - Abnormal; Notable for the following:    Heparin Unfractionated 0.28 (*)    All other components within normal limits  BASIC METABOLIC PANEL - Abnormal; Notable for the following:    Chloride 95 (*)    CO2 35 (*)    All other components within normal limits  POCT I-STAT 3, BLOOD GAS (G3+) - Abnormal; Notable for the following:    pCO2 arterial 56.1 (*)    pO2, Arterial 73.0 (*)    Bicarbonate 31.2 (*)    Acid-Base Excess 3.0 (*)    All other components within normal limits  URINE RAPID DRUG SCREEN (HOSP PERFORMED) - Abnormal; Notable for the following:    Opiates POSITIVE (*)    All other components within normal limits  HEPARIN LEVEL (UNFRACTIONATED) - Abnormal; Notable for the following:    Heparin Unfractionated 0.92 (*)    All other components within normal limits  PROTIME-INR - Abnormal; Notable for the following:    Prothrombin Time 16.3 (*)    All other components within normal limits  HEPARIN LEVEL (UNFRACTIONATED) - Abnormal; Notable for the following:    Heparin Unfractionated 0.83 (*)    All other components within normal limits  HEPARIN LEVEL (UNFRACTIONATED) - Abnormal; Notable  for the following:    Heparin Unfractionated 0.98 (*)    All other components within normal limits  PROTIME-INR - Abnormal; Notable for the following:    Prothrombin Time 21.2 (*)    INR 1.80 (*)    All other components within normal limits  CBC - Abnormal; Notable for the following:    RBC 5.88 (*)    All other components within normal limits  BASIC METABOLIC PANEL - Abnormal; Notable for the following:    Glucose, Bld 100 (*)    All other components within normal limits  PROTIME-INR - Abnormal; Notable for the following:    Prothrombin Time 22.6 (*)    INR 1.95 (*)    All other components within normal limits  BASIC METABOLIC PANEL - Abnormal; Notable for the following:    CO2 36 (*)    Glucose, Bld 100 (*)    GFR calc non Af Amer 90 (*)    All other components within normal limits  GLUCOSE, CAPILLARY - Abnormal; Notable for the following:    Glucose-Capillary 166 (*)    All other components within normal limits  GLUCOSE, CAPILLARY - Abnormal; Notable for the following:    Glucose-Capillary 184 (*)    All other components within normal limits  GLUCOSE, CAPILLARY - Abnormal; Notable for the following:    Glucose-Capillary 102 (*)    All other components within normal limits  GLUCOSE, CAPILLARY - Abnormal; Notable for the following:    Glucose-Capillary 118 (*)    All other components within normal limits  GLUCOSE, CAPILLARY - Abnormal; Notable for the following:    Glucose-Capillary 201 (*)    All other components within normal limits  PROTIME-INR - Abnormal; Notable for the following:    Prothrombin Time 24.0 (*)    INR 2.11 (*)    All other components  within normal limits  GLUCOSE, CAPILLARY - Abnormal; Notable for the following:    Glucose-Capillary 182 (*)    All other components within normal limits  GLUCOSE, CAPILLARY - Abnormal; Notable for the following:    Glucose-Capillary 193 (*)    All other components within normal limits  HEPARIN LEVEL (UNFRACTIONATED) -  Abnormal; Notable for the following:    Heparin Unfractionated <0.10 (*)    All other components within normal limits  PROTIME-INR - Abnormal; Notable for the following:    Prothrombin Time 26.1 (*)    INR 2.35 (*)    All other components within normal limits  GLUCOSE, CAPILLARY - Abnormal; Notable for the following:    Glucose-Capillary 121 (*)    All other components within normal limits  GLUCOSE, CAPILLARY - Abnormal; Notable for the following:    Glucose-Capillary 105 (*)    All other components within normal limits  GLUCOSE, CAPILLARY - Abnormal; Notable for the following:    Glucose-Capillary 141 (*)    All other components within normal limits  GLUCOSE, CAPILLARY - Abnormal; Notable for the following:    Glucose-Capillary 165 (*)    All other components within normal limits  PROTIME-INR - Abnormal; Notable for the following:    Prothrombin Time 27.8 (*)    INR 2.54 (*)    All other components within normal limits  GLUCOSE, CAPILLARY - Abnormal; Notable for the following:    Glucose-Capillary 206 (*)    All other components within normal limits  BASIC METABOLIC PANEL - Abnormal; Notable for the following:    Chloride 95 (*)    Glucose, Bld 114 (*)    All other components within normal limits  GLUCOSE, CAPILLARY - Abnormal; Notable for the following:    Glucose-Capillary 121 (*)    All other components within normal limits  GLUCOSE, CAPILLARY - Abnormal; Notable for the following:    Glucose-Capillary 140 (*)    All other components within normal limits  GLUCOSE, CAPILLARY - Abnormal; Notable for the following:    Glucose-Capillary 147 (*)    All other components within normal limits  PROTIME-INR - Abnormal; Notable for the following:    Prothrombin Time 26.5 (*)    INR 2.39 (*)    All other components within normal limits  GLUCOSE, CAPILLARY - Abnormal; Notable for the following:    Glucose-Capillary 111 (*)    All other components within normal limits  GLUCOSE,  CAPILLARY - Abnormal; Notable for the following:    Glucose-Capillary 129 (*)    All other components within normal limits  CBC  DIFFERENTIAL  PROTIME-INR  URINE MICROSCOPIC-ADD ON  POCT I-STAT TROPONIN I  MRSA PCR SCREENING  CBC  CREATININE, SERUM  TSH  CBC  PROTIME-INR  CBC  HEPARIN LEVEL (UNFRACTIONATED)  HEPARIN LEVEL (UNFRACTIONATED)  GLUCOSE, CAPILLARY  HEPARIN LEVEL (UNFRACTIONATED)  HEPARIN LEVEL (UNFRACTIONATED)  GLUCOSE, CAPILLARY  GLUCOSE, CAPILLARY  GLUCOSE, CAPILLARY  LAB REPORT - SCANNED   No results found.   1. Atrial fibrillation with rapid ventricular response       MDM          Nelia Shi, MD 04/05/11 [phone]

## 2011-04-06 NOTE — Discharge Summary (Signed)
I have reviewed this discharge summary and agree.    

## 2011-04-07 LAB — GLUCOSE, CAPILLARY

## 2011-04-09 ENCOUNTER — Ambulatory Visit (INDEPENDENT_AMBULATORY_CARE_PROVIDER_SITE_OTHER): Payer: Self-pay | Admitting: *Deleted

## 2011-04-09 DIAGNOSIS — I4891 Unspecified atrial fibrillation: Secondary | ICD-10-CM

## 2011-04-09 DIAGNOSIS — Z7901 Long term (current) use of anticoagulants: Secondary | ICD-10-CM

## 2011-04-09 LAB — POCT INR: INR: 1.6

## 2011-04-09 NOTE — Patient Instructions (Signed)

## 2011-04-10 ENCOUNTER — Ambulatory Visit: Payer: 59 | Admitting: Physician Assistant

## 2011-04-15 ENCOUNTER — Encounter (HOSPITAL_COMMUNITY): Payer: Self-pay | Admitting: Cardiology

## 2011-04-17 ENCOUNTER — Encounter: Payer: Self-pay | Admitting: Physician Assistant

## 2011-04-17 ENCOUNTER — Ambulatory Visit (INDEPENDENT_AMBULATORY_CARE_PROVIDER_SITE_OTHER): Payer: Self-pay | Admitting: Physician Assistant

## 2011-04-17 VITALS — BP 130/80 | HR 98 | Ht 74.0 in | Wt 313.0 lb

## 2011-04-17 DIAGNOSIS — R0683 Snoring: Secondary | ICD-10-CM

## 2011-04-17 DIAGNOSIS — I4891 Unspecified atrial fibrillation: Secondary | ICD-10-CM

## 2011-04-17 DIAGNOSIS — I509 Heart failure, unspecified: Secondary | ICD-10-CM

## 2011-04-17 DIAGNOSIS — I5032 Chronic diastolic (congestive) heart failure: Secondary | ICD-10-CM

## 2011-04-17 DIAGNOSIS — R0989 Other specified symptoms and signs involving the circulatory and respiratory systems: Secondary | ICD-10-CM

## 2011-04-17 MED ORDER — METOPROLOL TARTRATE 50 MG PO TABS: 75.0000 mg | ORAL_TABLET | Freq: Two times a day (BID) | ORAL | Status: DC

## 2011-04-17 MED ORDER — POTASSIUM CHLORIDE CRYS ER 20 MEQ PO TBCR: 20.0000 meq | EXTENDED_RELEASE_TABLET | Freq: Every day | ORAL | Status: DC

## 2011-04-17 MED ORDER — FUROSEMIDE 20 MG PO TABS: 60.0000 mg | ORAL_TABLET | Freq: Every morning | ORAL | Status: DC

## 2011-04-17 NOTE — Progress Notes (Addendum)
[address] [place], [place]  [address] Phone: [phone] Fax:  [phone]  Date:  04/17/2011   Name:  Darius [name]       DOB:  1955/12/22 MRN:  [id number]  PCP:  None  Primary Cardiologist:  Dr. Marca Ancona  Primary Electrophysiologist:  None    History of Present Illness: WILLIA [name] is a 56 y.o. male who presents for post hospital follow up.  He was admitted 2/13-2/22 with new-onset atrial fibrillation with rapid ventricular rate in the setting of possible pneumonia as well as diastolic heart failure and probable sleep apnea.  Echocardiogram 03/27/11: EF 50-55%, inferior hypokinesis, moderate LAE, mild RVE, normal pulmonary pressures.  He underwent transesophageal echocardiogram guided cardioversion.  This had to be performed under general anesthesia as he became combative with the standard approach.  However, he went back into atrial fibrillation in less than 24 hours.  He was started on amiodarone to help restore NSR with the thought that NSR would shrink his LA some and make him eligible for PVI ablation.  Coumadin was used for anticoagulation as he could not afford Xarelto or Pradaxa.  He needs an outpatient stress test due to his wall motion abnormality to help guide further anti-arrhythmic choices.  Of note, his QTc was too long for Tikosyn.  He also needs a sleep study arranged.  Labs: Hemoglobin 16.2, potassium 4.6, creatinine 0.91, ALT 71.  He notes some cough and congestion.  He denies significant dyspnea with exertion.  He probably describes class IIB symptoms.  He denies orthopnea, PND or significant pedal edema.  He denies chest discomfort, syncope or palpitations.  He is taking all his medications.  Past Medical History  Diagnosis Date  . Morbid obesity   . Atrial fibrillation     unable to tolerate versed and fentanyl sedation and required gen anesthesia for TEE;  s/p TEE-DCCV (failed);  Amiodarone started  . Chronic diastolic heart  failure     Echocardiogram 03/27/11: EF 50-55%, inferior hypokinesis, moderate LAE, mild RVE, normal pulmonary pressures.   . Sleep apnea     probable;  needs sleep test  . Diabetes mellitus, type 2     Current Outpatient Prescriptions  Medication Sig Dispense Refill  . albuterol (PROVENTIL HFA;VENTOLIN HFA) 108 (90 BASE) MCG/ACT inhaler Inhale 2 puffs into the lungs every 6 (six) hours as needed for wheezing (no more than 2 puffs in 1 hour).  1 Inhaler  0  . amiodarone (PACERONE) 400 MG tablet Take 1 tablet (400 mg total) by mouth 2 (two) times daily.  60 tablet  1  . diltiazem (CARDIZEM CD) 240 MG 24 hr capsule Take 1 capsule (240 mg total) by mouth daily.  30 capsule  1  . furosemide (LASIX) 20 MG tablet Take 2 tablets (40 mg total) by mouth every morning.  30 tablet  0  . guaiFENesin (MUCINEX) 600 MG 12 hr tablet Take 1,200 mg by mouth 2 (two) times daily as needed. As needed for congestion.      Marland Kitchen menthol-cetylpyridinium (CEPACOL) 3 MG lozenge Take 1 lozenge (3 mg total) by mouth as needed.  100 tablet  0  . metFORMIN (GLUCOPHAGE) 500 MG tablet Take 1 tablet (500 mg total) by mouth 2 (two) times daily with a meal.  60 tablet  1  . metoprolol (LOPRESSOR) 50 MG tablet Take 1 tablet (50 mg total) by mouth 2 (two) times daily.  60 tablet  1  . Multiple Vitamin (  MULITIVITAMIN WITH MINERALS) TABS Take 1 tablet by mouth daily.  30 tablet  0  . warfarin (COUMADIN) 6 MG tablet Take 1 tablet (6 mg total) by mouth daily at 6 PM.  30 tablet  1    Allergies: No Known Allergies  History  Substance Use Topics  . Smoking status: Never Smoker   . Smokeless tobacco: Not on file  . Alcohol Use: Yes     ROS:  Please see the history of present illness.   All other systems reviewed and negative.   PHYSICAL EXAM: VS:  BP 130/80  Pulse 98  Ht 6\' 2"  (1.88 m)  Wt 313 lb (141.976 kg)  BMI 40.19 kg/m2 Well nourished, well developed, talkative male, in no acute distress HEENT: normal Neck: +  JVD Cardiac:  normal S1, S2; irregularly irregular; no murmur Lungs:  clear to auscultation bilaterally, no wheezing, rhonchi or rales Abd: soft, nontender, no hepatomegaly Ext: 1+ bilateral edema Skin: warm and dry Neuro:  CNs 2-12 intact, no focal abnormalities noted Psych: normal active   EKG:  AFib, HR 108, normal axis, PRWP, NSSTTW changes  Lab Results  Component Value Date   INR 1.6 04/09/2011   INR 2.39* 04/04/2011   INR 2.54* 04/03/2011     ASSESSMENT AND PLAN:  1. Atrial fibrillation  Rate is uncontrolled.  I discussed his case with Dr. Marca Ancona, who followed him in the hospital.  He notes that the patient did not do well in a. fibrillation.  His INR recently was 1.6.  We will have to try to proceed with TEE guided cardioversion to restore normal sinus rhythm now that he has been loaded with amiodarone.  However, he has an abnormal echocardiogram with inferior wall motion abnormality.  We will try to obtain a Lexiscan Myoview early next week.  If this is low risk, we will proceed with TEE guided cardioversion with Dr. Marca Ancona later next week using general anesthesia.  If his Myoview is high risk, he will need left heart catheterization first to determine the extent of his CAD.  Increase metoprolol to 75 mg twice a day for better rate control.  Arrange followup in 2 weeks.  Arrange weekly followup with the Coumadin clinic.   2. Chronic diastolic heart failure  He is still volume overloaded.  Increase Lasix to 60 mg daily and add potassium 20 mEq daily.  Check a basic metabolic panel today and repeat in one week.   3. Snoring  Arrange sleep study.   4.  Diabetes Mellitus Type 2      Refer to primary care.   Signed, Tereso Newcomer, PA-C  2:56 PM 04/17/2011

## 2011-04-17 NOTE — Patient Instructions (Signed)
PLEASE HAVE INR CHECK EVERY WEEK PENDING DCCV  Your physician has recommended you make the following change in your medication: INCREASE LOPRESSOR 75 MG TWICE DAILY; INCREASE LASIX 60 MG DAILY AND POTASSIUM 20 MEQ DAILY;  Your physician recommends that you return for lab work in: TODAY BMET  Your physician recommends that you return for lab work in: 1 WEEK REPEAT BMET  You have been referred to [facility]; PT WAS SEEN ON THEIR SERVICE WHILE HE WAS IN THE HOSPITAL  Your physician has recommended that you have a SPLIT NIGHTsleep study, DX APNEA @ HS, SNORING, HEART FAILURE,  . This test records several body functions during sleep, including: brain activity, eye movement, oxygen and carbon dioxide blood levels, heart rate and rhythm, breathing rate and rhythm, the flow of air through your mouth and nose, snoring, body muscle movements, and chest and belly movement.  Your physician has requested that you have a lexiscan myoview DX [phone]. For further information please visit [email or web address] Please follow instruction sheet, as given.  Your physician recommends that you schedule a follow-up appointment in: 2 WEEKS WITH [address] PA-C

## 2011-04-18 ENCOUNTER — Telehealth: Payer: Self-pay | Admitting: *Deleted

## 2011-04-18 ENCOUNTER — Encounter: Payer: Self-pay | Admitting: *Deleted

## 2011-04-18 LAB — BASIC METABOLIC PANEL
BUN: 19 mg/dL (ref 6–23)
CO2: 28 mEq/L (ref 19–32)
Chloride: 100 mEq/L (ref 96–112)
Potassium: 4.2 mEq/L (ref 3.5–5.1)

## 2011-04-18 NOTE — Telephone Encounter (Signed)
Message copied by Tarri Fuller on Fri Apr 18, 2011  4:34 PM ------      Message from: [place], [place] T      Created: Fri Apr 18, 2011  2:16 PM       Please notify patient that the lab results are ok.      Tereso Newcomer, PA-C  2:16 PM 04/18/2011

## 2011-04-18 NOTE — Telephone Encounter (Signed)
s/w Rexford Maus a relative listed on pt's demographics to call, just let her know that I could not lm for pt that labs ok due to voice mailbox not set up yet, I will send a result letter to pt as well, she gave verbal understanding to this. Danielle Rankin

## 2011-04-24 ENCOUNTER — Other Ambulatory Visit: Payer: Self-pay

## 2011-05-01 ENCOUNTER — Other Ambulatory Visit (HOSPITAL_COMMUNITY): Payer: Self-pay

## 2011-05-02 ENCOUNTER — Ambulatory Visit: Payer: Self-pay | Admitting: Physician Assistant

## 2011-05-14 ENCOUNTER — Encounter (HOSPITAL_BASED_OUTPATIENT_CLINIC_OR_DEPARTMENT_OTHER): Payer: Self-pay

## 2011-07-08 ENCOUNTER — Other Ambulatory Visit: Payer: Self-pay | Admitting: *Deleted

## 2011-07-08 MED ORDER — DILTIAZEM HCL ER COATED BEADS 240 MG PO CP24: 240.0000 mg | ORAL_CAPSULE | Freq: Every day | ORAL | Status: DC

## 2011-07-09 ENCOUNTER — Ambulatory Visit (INDEPENDENT_AMBULATORY_CARE_PROVIDER_SITE_OTHER): Payer: Self-pay | Admitting: Pharmacist

## 2011-07-09 DIAGNOSIS — Z7901 Long term (current) use of anticoagulants: Secondary | ICD-10-CM

## 2011-07-09 DIAGNOSIS — I4891 Unspecified atrial fibrillation: Secondary | ICD-10-CM

## 2011-07-09 LAB — POCT INR: INR: 1.2

## 2011-07-09 MED ORDER — WARFARIN SODIUM 6 MG PO TABS: ORAL_TABLET | ORAL | Status: DC

## 2011-07-11 ENCOUNTER — Other Ambulatory Visit: Payer: Self-pay | Admitting: Internal Medicine

## 2011-07-11 NOTE — Telephone Encounter (Signed)
F/u Refill-Warfarin RX sent to the wrong Pharmacy, refill should be sent to [facility] [place], [place] (BELOW)                  Med notes show this RX was approved on 07/09/11.  Patient waiting in pharmacy as we speak on vacation.                PLEASE SEND RX TO :   [facility] [facility] [place], [place] - [address]

## 2011-07-14 ENCOUNTER — Other Ambulatory Visit: Payer: Self-pay

## 2011-07-14 MED ORDER — WARFARIN SODIUM 6 MG PO TABS: ORAL_TABLET | ORAL | Status: DC

## 2011-07-24 ENCOUNTER — Telehealth: Payer: Self-pay | Admitting: Internal Medicine

## 2011-07-24 ENCOUNTER — Telehealth: Payer: Self-pay

## 2011-07-24 NOTE — Telephone Encounter (Signed)
CALLED TO PT ON MISSED APPOINTMENT;NO ANSWER.

## 2011-07-28 ENCOUNTER — Other Ambulatory Visit: Payer: Self-pay | Admitting: Cardiovascular Disease

## 2011-09-29 ENCOUNTER — Telehealth: Payer: Self-pay

## 2011-09-29 ENCOUNTER — Ambulatory Visit: Payer: Self-pay | Admitting: Cardiovascular Disease

## 2011-09-29 DIAGNOSIS — I4891 Unspecified atrial fibrillation: Secondary | ICD-10-CM

## 2011-09-29 NOTE — Telephone Encounter (Signed)
Spoke with pt, pt states he is no longer taking Coumadin, rx refill was denied at pharmacy.  Advised pt we cannot refill rx without compliance of monitoring INR's because of the risk of bleeding.  Pt states he has not taken any of his medications in the past 3-4 weeks secondary to lethargy after taking and he is afraid he will lose his job.  Pt states he has no insurance and he is unable to afford office visits.  Pt understands risks associated with being off medication, but he states he has to work to pay his bills and the medications make him sleepy and he is unable to afford appropriate f/u at the present time. FYI.  Pt states he will call back when he gets some insurance and can restart his medications and f/u in the clinic.

## 2011-09-29 NOTE — Telephone Encounter (Signed)
Attempted to call pt overdue for Coumadin follow-up.  No answer, unable to leave message.  Sent delinquent Coumadin letter.

## 2011-10-02 NOTE — Telephone Encounter (Signed)
Attempted to contact pt at [phone]. Unable to speak with pt or leave a message at this number. I contact pt's first cousin, Rexford Maus listed as pt's emergency contact  at [phone]. I asked her to have pt contact me to discuss medication and referral to [facility] for financial assistance. I confirmed Warfarin is on [facility]'s $4 list.

## 2011-10-14 NOTE — Telephone Encounter (Signed)
Attempted to contact pt again at [phone]. Unable to speak with pt or leave a message.

## 2012-05-29 ENCOUNTER — Ambulatory Visit (INDEPENDENT_AMBULATORY_CARE_PROVIDER_SITE_OTHER): Payer: BC Managed Care – PPO | Admitting: Family Medicine

## 2012-05-29 ENCOUNTER — Ambulatory Visit (HOSPITAL_COMMUNITY)
Admission: RE | Admit: 2012-05-29 | Discharge: 2012-05-29 | Disposition: A | Discharge status: Home / Self Care | Payer: BC Managed Care – PPO | Source: Ambulatory Visit | Attending: Family Medicine | Admitting: Family Medicine

## 2012-05-29 VITALS — BP 164/74 | HR 93 | Temp 97.9°F | Resp 20 | Wt 344.0 lb

## 2012-05-29 DIAGNOSIS — M79662 Pain in left lower leg: Secondary | ICD-10-CM

## 2012-05-29 DIAGNOSIS — R600 Localized edema: Secondary | ICD-10-CM

## 2012-05-29 DIAGNOSIS — R609 Edema, unspecified: Secondary | ICD-10-CM

## 2012-05-29 DIAGNOSIS — M7989 Other specified soft tissue disorders: Secondary | ICD-10-CM

## 2012-05-29 DIAGNOSIS — M79609 Pain in unspecified limb: Secondary | ICD-10-CM

## 2012-05-29 DIAGNOSIS — I4891 Unspecified atrial fibrillation: Secondary | ICD-10-CM

## 2012-05-29 LAB — POCT CBC
MCH, POC: 28 pg (ref 27–31.2)
MCV: 87.7 fL (ref 80–97)
MID (cbc): 0.6 (ref 0–0.9)
MPV: 8.5 fL (ref 0–99.8)
POC LYMPH PERCENT: 35.8 %L (ref 10–50)
POC MID %: 10 %M (ref 0–12)
Platelet Count, POC: 252 10*3/uL (ref 142–424)
RDW, POC: 15 %
WBC: 5.9 10*3/uL (ref 4.6–10.2)

## 2012-05-29 MED ORDER — FUROSEMIDE 20 MG PO TABS: 20.0000 mg | ORAL_TABLET | Freq: Every day | ORAL | Status: DC

## 2012-05-29 MED ORDER — OXAPROZIN 600 MG PO TABS: ORAL_TABLET | ORAL | Status: DC

## 2012-05-29 NOTE — Progress Notes (Signed)
Subjective: 57 year old man who comes in with his left leg swollen and tender in the calf. 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 Has never had leg swelling problems in the past, but been swelling lately. He was supposed to be on Coumadin for major fibrillation, but he quit that on his own some time ago. He feels well otherwise except for some shortness of breath.  Objective: Obese male in no acute distress. Chest clear. Heart regular to auscultation. All do an EKG to see if he is in atrial fibrillation. He has calves are normal the right, swollen on the left. He is swollen with 3+ pitting edema on the left. It isn't the tenderness is right in the mid calf. He has a negative Homans sign.  Assessment: Left leg swelling and tenderness, suspicious for DVT History of atrial fibrillation  Morbid obesity  Plan: Send for ultrasound of legs EKG was done and was normal without any evidence for atrial fibrillation at this time Results for orders placed in visit on 05/29/12  POCT CBC      Result Value Range   WBC 5.9  4.6 - 10.2 K/uL   Lymph, poc 2.1  0.6 - 3.4   POC LYMPH PERCENT 35.8  10 - 50 %L   MID (cbc) 0.6  0 - 0.9   POC MID % 10.0  0 - 12 %M   POC Granulocyte 3.2  2 - 6.9   Granulocyte percent 54.2  37 - 80 %G   RBC 5.60  4.69 - 6.13 M/uL   Hemoglobin 15.7  14.1 - 18.1 g/dL   HCT, POC 40.9  81.1 - 53.7 %   MCV 87.7  80 - 97 fL   MCH, POC 28.0  27 - 31.2 pg   MCHC 32.0  31.8 - 35.4 g/dL   RDW, POC 91.4     Platelet Count, POC 252  142 - 424 K/uL   MPV 8.5  0 - 99.8 fL

## 2012-05-29 NOTE — Patient Instructions (Addendum)
Take the Daypro one twice daily for pain and inflammation. This is like a strong Advil or Aleve.  Take the Lasix 20 mg one daily as needed for swelling.  Keep foot elevated as much as possible.  If not doing better by midweek return to the office for recheck.Come in sooner if worse at any time.

## 2012-05-29 NOTE — Progress Notes (Signed)
VASCULAR LAB PRELIMINARY  PRELIMINARY  PRELIMINARY  PRELIMINARY  Left lower extremity venous Doppler completed.    Preliminary report:  There is no DVT or SVT noted in the left lower extremity.  There is a hypoechoic area noted from proximal to mid calf that could be consistent with a muscle tear.  Walid Haig, RVT 05/29/2012, 3:07 PM

## 2012-12-11 ENCOUNTER — Ambulatory Visit (INDEPENDENT_AMBULATORY_CARE_PROVIDER_SITE_OTHER): Payer: BC Managed Care – PPO | Admitting: Emergency Medicine

## 2012-12-11 VITALS — BP 184/98 | HR 107 | Temp 98.0°F | Resp 16 | Ht 74.0 in | Wt 349.0 lb

## 2012-12-11 DIAGNOSIS — I4891 Unspecified atrial fibrillation: Secondary | ICD-10-CM

## 2012-12-11 DIAGNOSIS — R609 Edema, unspecified: Secondary | ICD-10-CM

## 2012-12-11 DIAGNOSIS — R600 Localized edema: Secondary | ICD-10-CM

## 2012-12-11 MED ORDER — NAPROXEN SODIUM 550 MG PO TABS: 550.0000 mg | ORAL_TABLET | Freq: Two times a day (BID) | ORAL | Status: DC

## 2012-12-11 MED ORDER — FUROSEMIDE 40 MG PO TABS: 40.0000 mg | ORAL_TABLET | Freq: Every day | ORAL | Status: DC

## 2012-12-11 NOTE — Progress Notes (Signed)
[facility] [address] [place] [address] [phone]  Date:  12/11/2012   Name:  Jostin Rue   DOB:  May 20, 1955   MRN:  [id number]  PCP:  No PCP Per Patient    Chief Complaint: Leg Pain   History of Present Illness:  Darius [name] is a 57 y.o. very pleasant male patient who presents with the following:  Poorly compliant man with history of AF and anticoagulation.  Stopped the medication over a year ago.  Has significant weight gain recently.  Not taking his lasix and has recurrent peripheral edema.  Little insight into his medical condition or appropriate treatment.  Non smoker. Has pain in left leg from buttock into his posterior calf.  No neuro symptoms or weakness.  No history of injury or overuse.  Pain worse when sits.  Improves with standing.  Gone when lays down.  No improvement with over the counter medications or other home remedies. Denies other complaint or health concern today.   Patient Active Problem List   Diagnosis Date Noted  . Atrial Fibrillation 03/27/2011  . Obesity, Class III, BMI 40-49.9 (morbid obesity) 03/27/2011  . URI, acute 03/27/2011    Past Medical History  Diagnosis Date  . Morbid obesity   . Atrial fibrillation     unable to tolerate versed and fentanyl sedation and required gen anesthesia for TEE;  s/p TEE-DCCV (failed);  Amiodarone started  . Chronic diastolic heart failure     Echocardiogram 03/27/11: EF 50-55%, inferior hypokinesis, moderate LAE, mild RVE, normal pulmonary pressures.   . Sleep apnea     probable;  needs sleep test  . Diabetes mellitus, type 2     Past Surgical History  Procedure Laterality Date  . Tee without cardioversion  04/01/2011    Procedure: TRANSESOPHAGEAL ECHOCARDIOGRAM (TEE);  Surgeon: Lewayne Bunting, MD;  Location: [facility] ENDOSCOPY;  Service: Cardiovascular;  Laterality: N/A;  . Cardioversion  04/01/2011    Procedure: CARDIOVERSION;  Surgeon: Lewayne Bunting, MD;  Location[facility]  ENDOSCOPY;  Service: Cardiovascular;  Laterality: N/A;  . Tee without cardioversion  04/03/2011    Procedure: TRANSESOPHAGEAL ECHOCARDIOGRAM (TEE);  Surgeon: Lewayne Bunting, MD;  Location: [facility] ENDOSCOPY;  Service: Cardiovascular;  Laterality: N/A;  . Cardioversion  04/03/2011    Procedure: CARDIOVERSION;  Surgeon: Lewayne Bunting, MD;  Location: [facility] ENDOSCOPY;  Service: Cardiovascular;  Laterality: N/A;  . Tee without cardioversion  04/03/2011    Procedure: TRANSESOPHAGEAL ECHOCARDIOGRAM (TEE);  Surgeon: Lewayne Bunting, MD;  Location: [facility] OR;  Service: Cardiovascular;  Laterality: N/A;  . Cardioversion  04/03/2011    Procedure: CARDIOVERSION;  Surgeon: Lewayne Bunting, MD;  Location: [facility] OR;  Service: Cardiovascular;  Laterality: N/A;    History  Substance Use Topics  . Smoking status: Never Smoker   . Smokeless tobacco: Not on file  . Alcohol Use: Yes    Family History  Problem Relation Age of Onset  . Diabetes Father     died in his 43s  . Hypertension Father     died in her 82s  . Seizures Mother   . Hypertension Sister     No Known Allergies  Medication list has been reviewed and updated.  Current Outpatient Prescriptions on File Prior to Visit  Medication Sig Dispense Refill  . diltiazem (CARDIZEM CD) 240 MG 24 hr capsule Take 1 capsule (240 mg total) by mouth daily.  30 capsule  3  . furosemide (LASIX) 20 MG  tablet Take 1 tablet (20 mg total) by mouth daily.  30 tablet  3  . oxaprozin (DAYPRO) 600 MG tablet One twice daily for pain and inflammation with breakfast and supper  30 tablet  1  . potassium chloride SA (KLOR-CON M20) 20 MEQ tablet Take 1 tablet (20 mEq total) by mouth daily.  30 tablet  6  . warfarin (COUMADIN) 6 MG tablet Take as directed by Anticoagulation clinic.  20 tablet  0   No current facility-administered medications on file prior to visit.    Review of Systems:  As per HPI, otherwise negative.    Physical Examination: Filed Vitals:   12/11/12  1400  BP: 184/98  Pulse: 107  Temp: 98 F (36.7 C)  Resp: 16   Filed Vitals:   12/11/12 1400  Height: 6\' 2"  (1.88 m)  Weight: 349 lb (158.305 kg)   Body mass index is 44.79 kg/(m^2). Ideal Body Weight: Weight in (lb) to have BMI = 25: 194.3  GEN: morbidly obese, NAD, Non-toxic, A & O x 3 HEENT: Atraumatic, Normocephalic. Neck supple. No masses, No LAD. Ears and Nose: No external deformity. CV: RRR, No M/G/R. No JVD. No thrill. No extra heart sounds. PULM: CTA B, no wheezes, crackles, rhonchi. No retractions. No resp. distress. No accessory muscle use. ABD: S, NT, ND, +BS. No rebound. No HSM. EXTR: No c/c  Marked peripheral edema bilaterally NEURO Normal gait.  PSYCH: Normally interactive. Conversant. Not depressed or anxious appearing.  Calm demeanor.    Assessment and Plan: AF Peripheral edema Noncompliance Sciatic neuritis GO SEE CARDIOLOGIST Resume lasix Anaprox   Signed,  Phillips Odor, MD

## 2012-12-11 NOTE — Patient Instructions (Signed)
Atrial Fibrillation  Your caregiver has diagnosed you with atrial fibrillation (AFib). The heart normally beats very regularly; AFib is a type of irregular heartbeat. The heart rate may be faster or slower than normal. This can prevent your heart from pumping as well as it should. AFib can be constant (chronic) or intermittent (paroxysmal).  CAUSES   Atrial fibrillation may be caused by:   Heart disease, including heart attack, coronary artery disease, heart failure, diseases of the heart valves, and others.   Blood clot in the lungs (pulmonary embolism).   Pneumonia or other infections.   Chronic lung disease.   Thyroid disease.   Toxins. These include alcohol, some medications (such as decongestant medications or diet pills), and caffeine.  In some people, no cause for AFib can be found. This is referred to as Lone Atrial Fibrillation.  SYMPTOMS    Palpitations or a fluttering in your chest.   A vague sense of chest discomfort.   Shortness of breath.   Sudden onset of lightheadedness or weakness.  Sometimes, the first sign of AFib can be a complication of the condition. This could be a stroke or heart failure.  DIAGNOSIS   Your description of your condition may make your caregiver suspicious of atrial fibrillation. Your caregiver will examine your pulse to determine if fibrillation is present. An EKG (electrocardiogram) will confirm the diagnosis. Further testing may help determine what caused you to have atrial fibrillation. This may include chest x-ray, echocardiogram, blood tests, or CT scans.  PREVENTION   If you have previously had atrial fibrillation, your caregiver may advise you to avoid substances known to cause the condition (such as stimulant medications, and possibly caffeine or alcohol). You may be advised to use medications to prevent recurrence. Proper treatment of any underlying condition is important to help prevent recurrence.  PROGNOSIS   Atrial fibrillation does tend to become a  chronic condition over time. It can cause significant complications (see below). Atrial fibrillation is not usually immediately life-threatening, but it can shorten your life expectancy. This seems to be worse in women. If you have lone atrial fibrillation and are under 60 years old, the risk of complications is very low, and life expectancy is not shortened.  RISKS AND COMPLICATIONS   Complications of atrial fibrillation can include stroke, chest pain, and heart failure. Your caregiver will recommend treatments for the atrial fibrillation, as well as for any underlying conditions, to help minimize risk of complications.  TREATMENT   Treatment for AFib is divided into several categories:   Treatment of any underlying condition.   Converting you out of AFib into a regular (sinus) rhythm.   Controlling rapid heart rate.   Prevention of blood clots and stroke.  Medications and procedures are available to convert your atrial fibrillation to sinus rhythm. However, recent studies have shown that this may not offer you any advantage, and cardiac experts are continuing research and debate on this topic.  More important is controlling your rapid heartbeat. The rapid heartbeat causes more symptoms, and places strain on your heart. Your caregiver will advise you on the use of medications that can control your heart rate.  Atrial fibrillation is a strong stroke risk. You can lessen this risk by taking blood thinning medications such as Coumadin (warfarin), or sometimes aspirin. These medications need close monitoring by your caregiver. Over-medication can cause bleeding. Too little medication may not protect against stroke.  HOME CARE INSTRUCTIONS    If your caregiver prescribed medicine to make   your heartbeat more normally, take as directed.   If blood thinners were prescribed by your caregiver, take EXACTLY as directed.   Perform blood tests EXACTLY as directed.   Quit smoking. Smoking increases your cardiac and lung  (pulmonary) risks.   DO NOT drink alcohol.   DO NOT drink caffeinated drinks (e.g. coffee, soda, chocolate, and leaf teas). You may drink decaffeinated coffee, soda or tea.   If you are overweight, you should choose a reduced calorie diet to lose weight. Please see a registered dietitian if you need more information about healthy weight loss. DO NOT USE DIET PILLS as they may aggravate heart problems.   If you have other heart problems that are causing AFib, you may need to eat a low salt, fat, and cholesterol diet. Your caregiver will tell you if this is necessary.   Exercise every day to improve your physical fitness. Stay active unless advised otherwise.   If your caregiver has given you a follow-up appointment, it is very important to keep that appointment. Not keeping the appointment could result in heart failure or stroke. If there is any problem keeping the appointment, you must call back to this facility for assistance.  SEEK MEDICAL CARE IF:   You notice a change in the rate, rhythm or strength of your heartbeat.   You develop an infection or any other change in your overall health status.  SEEK IMMEDIATE MEDICAL CARE IF:    You develop chest pain, abdominal pain, sweating, weakness or feel sick to your stomach (nausea).   You develop shortness of breath.   You develop swollen feet and ankles.   You develop dizziness, numbness, or weakness of your face or limbs, or any change in vision or speech.  MAKE SURE YOU:    Understand these instructions.   Will watch your condition.   Will get help right away if you are not doing well or get worse.  Document Released: 01/27/2005 Document Revised: 04/21/2011 Document Reviewed: 09/05/2009  [employer] Patient Information 2014 [employer].    Heart Failure  Heart failure is a condition in which the heart has trouble pumping blood. This means your heart does not pump blood efficiently for your body to work well. In some cases of heart failure, fluid may  back up into your lungs or you may have swelling (edema) in your lower legs. Heart failure is a long-term (chronic) condition. It is important for you to take good care of yourself and follow your caregiver's treatment plan.  CAUSES    Health conditions:   High blood pressure (hypertension) causes the heart muscle to work harder than normal. When pressure in the blood vessels is high, the heart needs to pump (contract) with more force in order to circulate blood throughout the body. High blood pressure eventually causes the heart to become stiff and weak.   Coronary artery disease (CAD) is the buildup of cholesterol and fat (plaque) in the arteries of the heart. The blockage in the arteries deprives the heart muscle of oxygen and blood. This can cause chest pain and may lead to a heart attack. High blood pressure can also contribute to CAD.   Heart attack (myocardial infarction) occurs when 1 or more arteries in the heart become blocked. The loss of oxygen damages the muscle tissue of the heart. When this happens, part of the heart muscle dies. The injured tissue does not contract as well and weakens the heart's ability to pump blood.   Abnormal heart   valves can cause heart failure when the heart valves do not open and close properly. This makes the heart muscle pump harder to keep the blood flowing.   Heart muscle disease (cardiomyopathy or myocarditis) is damage to the heart muscle from a variety of causes. These can include drug or alcohol abuse, infections, or unknown reasons. These can increase the risk of heart failure.   Lung disease makes the heart work harder because the lungs do not work properly. This can cause a strain on the heart, leading it to fail.   Diabetes increases the risk of heart failure. High blood sugar contributes to high fat (lipid) levels in the blood. Diabetes can also cause slow damage to tiny blood vessels that carry important nutrients to the heart muscle. When the heart does  not get enough oxygen and food, it can cause the heart to become weak and stiff. This leads to a heart that does not contract efficiently.   Other diseases can contribute to heart failure. These include abnormal heart rhythms, thyroid problems, and low blood counts (anemia).   Unhealthy behaviors:   Being overweight.   Smoking or chewing tobacco.   Eating foods high in fat and cholesterol.   Eating foods or drinking beverages high in salt.   Abusing drugs or alcohol.   Lacking physical activity.  SYMPTOMS   Heart failure symptoms may vary and can be hard to detect. Symptoms may include:   Shortness of breath with activity, such as climbing stairs.   Persistent cough.   Swelling of the feet, ankles, legs, or abdomen.   Unexplained, sudden weight gain.   Difficulty breathing when lying flat (orthopnea).   Waking from sleep because of the need to sit up and get more air.   Rapid heartbeat.   Fatigue and loss of energy.   Feeling lightheaded, dizzy, or close to fainting.   Loss of appetite.   Nausea.   Increased urination during the night (nocturia).  DIAGNOSIS   A diagnosis of heart failure is based on your history, symptoms, physical examination, and diagnostic tests.  Diagnostic tests for heart failure may include:   Electrocardiography.   Chest X-ray.   Blood tests.   Exercise stress test.   Echocardiography.   Cardiac angiography.   Radionuclide scans.  TREATMENT   Treatment is aimed at managing the symptoms of heart failure. Medicines, behavioral changes, or surgical intervention may be necessary to treat heart failure.   Medicines to help treat heart failure may include:   Angiotensin-converting enzyme (ACE) inhibitors. This type of medicine blocks the effects of a blood protein called angiotensin-converting enzyme. ACE inhibitors relax (dilate) the blood vessels and help lower blood pressure.   Angiotensin receptor blockers. This type of medicine blocks the actions of a blood  protein called angiotensin. Angiotensin receptor blockers dilate the blood vessels and help lower blood pressure.   Aldosterone antagonists. This type of medicine helps rid your body of extra fluid. This loss of extra fluid lowers the volume of blood the heart pumps.   Water pills (diuretics). Diuretics cause the kidneys to remove salt and water from the blood. The extra fluid is removed through urination.   Beta blockers. These prevent the heart from beating too fast and improve heart muscle strength.   Digitalis. This increases the force of the heartbeat.   Healthy behavior changes include:   Obtaining and maintaining a healthy weight.   Stopping smoking or chewing tobacco.   Eating heart healthy foods.     Limiting or avoiding alcohol.   Stopping drug use.   Becoming as physically active as possible.   Surgical treatment for heart failure may include:   A procedure to open blocked arteries, repair damaged heart valves, or remove damaged heart muscle tissue.   A pacemaker to improve heart muscle function and control certain abnormal heart rhythms.   An internal cardioverter defibrillator to possibly prevent sudden cardiac death.   A left ventricular assist device to assist the pumping ability of the heart.  HOME CARE INSTRUCTIONS    Take your medicine as directed by your caregiver. Medicines are important in reducing the workload of your heart, slowing the progression of heart failure, and improving your symptoms.   Do not stop taking your medicine unless directed by your caregiver.   Do not skip any dose of medicine.   Refill your prescriptions before you run out of medicine. Your medicines are needed every day.   Take over-the-counter medicine only as directed by your caregiver or pharmacist.   Stay physically active. Your caregiver or cardiac rehabilitation program can help determine your level of physical activity. It is important to follow your specific physical activity program. Regular  physical activity can control weight and improve your energy, endurance, health, cholesterol levels, mood, and nighttime sleep. It is important to pace your physical activity to avoid shortness of breath or chest pain. It is recommended to rest for 1 hour before and after meals.   Eat heart healthy foods. Food choices should be low in saturated fat, trans fat, cholesterol, and salt (sodium). Healthy choices include fresh or frozen fruits and vegetables, fish, lean meats, legumes, fat-free or low-fat dairy products, and whole grain or high fiber foods. Limit your sodium to 1500 milligrams (mg) each day or as recommended by your caregiver. Talk to a dietitian to learn more about heart healthy foods and healthy seasonings.   Use healthy cooking methods. Healthy cooking methods include roasting, grilling, broiling, baking, poaching, steaming, or stir-frying. Talk to a dietitian to learn more about healthy cooking methods.   Limit fluids if directed by your caregiver. Fluid restriction may reduce symptoms of heart failure in some individuals.   Weigh yourself every day. Daily weights are important in the early recognition of excess fluid. You should weigh yourself every morning after you urinate and before you eat breakfast. Wear the same amount of clothing each time you weigh yourself. Record your daily weight. Provide your caregiver with your weight record.   Monitor and record your blood pressure if directed by your caregiver.   Check your pulse if directed by your caregiver.   If you are overweight, it is time to lose and maintain a healthy weight.   Stop smoking or chewing tobacco. Nicotine makes your heart work harder by causing your blood vessels to constrict. Do not use nicotine gum or patches before talking to your caregiver.   Schedule and attend follow-up visits as directed by your caregiver. It is important to keep all your appointments.   Limit alcohol intake to no more than 1 drink per day for  nonpregnant women and 2 drinks per day for men. Drinking more than that is harmful to your heart. Tell your caregiver if you drink alcohol several times a week. Talk with your caregiver about whether alcohol is safe for you. If your heart has already been damaged by alcohol or you have severe heart failure, drinking alcohol should be stopped completely.   Stop illegal drug use.     Stay up-to-date with immunizations. It is especially important to prevent respiratory infections through current pneumococcal and influenza immunizations.   Manage other health conditions such as hypertension, diabetes, thyroid disease, or abnormal heart rhythms as directed by your caregiver.   Learn to manage stress.   Plan rest periods when fatigued.   Learn strategies to manage high temperatures. If the weather is extremely hot:   Avoid vigorous physical activity.   Use air conditioning or fans or seek a cooler location.   Avoid caffeine and alcohol.   Wear loose-fitting, lightweight, and light-colored clothing.   Learn strategies to manage cold temperatures. If the weather is extremely cold:   Avoid vigorous physical activity.   Layer clothes.   Wear mittens or gloves, a hat, and a scarf when going outside.   Avoid alcohol.   Obtain ongoing education and support as needed.   Participate or seek rehabilitation as needed to maintain or improve independence and quality of life.  SEEK MEDICAL CARE IF:    Your weight increases by 3 lb/1.4 kg in 1 day or 5 lb/2.3 kg in a week.   You have increasing shortness of breath that is unusual for you.   You are unable to participate in your usual physical activities.   You tire easily.   You cough more than normal, especially with physical activity.   You have any or more swelling in areas such as your hands, feet, ankles, or abdomen.   You are unable to sleep because it is hard to breathe.   You cough up bloody mucus (sputum).   You feel like your heart is beating fast  (palpitations).   You become dizzy or lightheaded upon standing up.  SEEK IMMEDIATE MEDICAL CARE IF:    You have difficulty breathing.   There is a change in mental status such as decreased alertness or difficulty with concentration.   You have a pain or discomfort in your chest.   You have an episode of fainting (syncope).   You are in an area of high temperatures and you have signs of heat exhaustion:   Heavy sweating.   Muscle cramps.   Weakness.   Dizziness.   Headaches.   Fainting.   You are in an area of cold temperatures and you have signs of hypothermia:   Clumsiness.   Confusion.   Sleepiness.   Shivering.  MAKE SURE YOU:    Understand these instructions.   Will watch your condition.   Will get help right away if you are not doing well or get worse.  Document Released: 01/27/2005 Document Revised: 01/14/2012 Document Reviewed: 08/28/2011  [employer] Patient Information 2014 [employer].

## 2013-01-19 ENCOUNTER — Encounter: Payer: Self-pay | Admitting: Physician Assistant

## 2013-01-19 ENCOUNTER — Telehealth: Payer: Self-pay | Admitting: *Deleted

## 2013-01-19 ENCOUNTER — Ambulatory Visit (INDEPENDENT_AMBULATORY_CARE_PROVIDER_SITE_OTHER): Payer: BC Managed Care – PPO | Admitting: Physician Assistant

## 2013-01-19 VITALS — BP 136/86 | HR 118 | Resp 22 | Ht 74.0 in | Wt 344.4 lb

## 2013-01-19 DIAGNOSIS — R0683 Snoring: Secondary | ICD-10-CM

## 2013-01-19 DIAGNOSIS — R0602 Shortness of breath: Secondary | ICD-10-CM

## 2013-01-19 DIAGNOSIS — E119 Type 2 diabetes mellitus without complications: Secondary | ICD-10-CM

## 2013-01-19 DIAGNOSIS — R9389 Abnormal findings on diagnostic imaging of other specified body structures: Secondary | ICD-10-CM

## 2013-01-19 DIAGNOSIS — R931 Abnormal findings on diagnostic imaging of heart and coronary circulation: Secondary | ICD-10-CM

## 2013-01-19 DIAGNOSIS — I5032 Chronic diastolic (congestive) heart failure: Secondary | ICD-10-CM

## 2013-01-19 DIAGNOSIS — R0609 Other forms of dyspnea: Secondary | ICD-10-CM

## 2013-01-19 DIAGNOSIS — I4891 Unspecified atrial fibrillation: Secondary | ICD-10-CM

## 2013-01-19 LAB — BASIC METABOLIC PANEL
BUN: 11 mg/dL (ref 6–23)
CO2: 28 mEq/L (ref 19–32)
GFR: 88.38 mL/min (ref >60.00)
Glucose, Bld: 289 mg/dL — ABNORMAL HIGH (ref 70–99)
Potassium: 4.2 mEq/L (ref 3.5–5.1)

## 2013-01-19 LAB — CBC WITH DIFFERENTIAL/PLATELET
Basophils Absolute: 0 10*3/uL (ref 0.0–0.1)
Eosinophils Absolute: 0.4 10*3/uL (ref 0.0–0.7)
HCT: 49.6 % (ref 39.0–52.0)
Lymphs Abs: 1.5 10*3/uL (ref 0.7–4.0)
MCHC: 33.3 g/dL (ref 30.0–36.0)
MCV: 84 fl (ref 78.0–100.0)
Monocytes Absolute: 0.6 10*3/uL (ref 0.1–1.0)
Monocytes Relative: 8.9 % (ref 3.0–12.0)
Platelets: 236 10*3/uL (ref 150.0–400.0)
RDW: 14.2 % (ref 11.5–14.6)

## 2013-01-19 LAB — BRAIN NATRIURETIC PEPTIDE: Pro B Natriuretic peptide (BNP): 24 pg/mL (ref 0.0–100.0)

## 2013-01-19 MED ORDER — FUROSEMIDE 40 MG PO TABS: 20.0000 mg | ORAL_TABLET | Freq: Every day | ORAL | Status: DC

## 2013-01-19 MED ORDER — ASPIRIN EC 81 MG PO TBEC: 81.0000 mg | DELAYED_RELEASE_TABLET | Freq: Every day | ORAL | Status: DC

## 2013-01-19 MED ORDER — DILTIAZEM HCL ER COATED BEADS 120 MG PO CP24: 120.0000 mg | ORAL_CAPSULE | Freq: Every day | ORAL | Status: DC

## 2013-01-19 NOTE — Patient Instructions (Addendum)
START CARDIZEM CD 120 MG DAILY; RX SENT IN  START ASPRIN 81 MG DAILY  DECREASE LASIX TO 20 MG DAILY (THIS IS A 1/2 TABLET DAILY)  LAB WORK TODAY; BMET, CBC W/DIFF, BNP  REPEAT  BMET IN 1 WEEK  YOU NEED TO SCHEDULE FOR A SPLIT NIGHT SLEEP STUDY; DX A-FIB, SNORING  PLEASE FOLLOW UP WITH SCOTT WEAVER, PAC 2-3 WEEKS SAME DAY DR. [name] IS IN THE OFFICE  PLEASE SCHEDULE TO HAVE A LEXISCAN MYOVIEW  You have been referred to FOR PRIMARY CARE DX DIABETES

## 2013-01-19 NOTE — Progress Notes (Signed)
[address], [address] [place], [place]  [address] Phone: [phone] Fax:  [phone]  Date:  01/19/2013   ID:  Darius [name], DOB May 31, 1955, MRN [id number]  PCP:  No PCP Per Patient  Cardiologist:  Dr. Marca Ancona     History of Present Illness: Darius [name] is a 57 y.o. male with a hx of DM, atrial fibrillation and diastolic CHF. Patient was initially evaluated in the hospital in 03/2011 with new-onset atrial fibrillation with RVR in the setting of possible pneumonia as well as diastolic heart failure.  Echo 03/27/11: EF 50-55%, inferior HK, mod LAE, mild RVE, normal pulmonary pressures. He underwent transesophageal echocardiogram guided cardioversion. TEE (04/03/11): EF 40-45%, mild MR moderate LAE, no LAA clot, mild RAE.  TEE had to be performed under general anesthesia as he became combative with the standard approach. However, he went back into atrial fibrillation in less than 24 hours. He was started on amiodarone to help restore NSR with the thought that NSR would shrink his LA some and make him eligible for PVI ablation. Coumadin was used for anticoagulation as he could not afford Xarelto or Pradaxa. He needed an outpatient stress test due to his wall motion abnormality to help guide further anti-arrhythmic choices. Of note, his QTc was too long for Tikosyn. He needed a sleep study arranged.  He was last seen in this office 04/17/2011. At that time he remained in atrial fibrillation. He was on amiodarone and the plan was to proceed with TEE guided cardioversion.  However, this was never performed. A stress test was also arranged. This also was never performed. His chart indicates that he stopped Coumadin on his own. A left LE venous US in 05/2012 was negative for DVT.  He was seen at [facility] last month for edema and was placed back on Lasix. He was asked to followup here.  Patient tells me that he lost his insurance and was unable to take any medications.  He apparently lost his  job because he fell asleep at work. Overall, he has been fairly stable. He has dyspnea with more extreme activities. He probably describes NYHA class 2-2b.  He denies chest pain. He sleeps on one pillow. He denies PND. LE edema is much improved on Lasix. However, he tells me that he stopped taking Lasix 2 days ago so he can go give blood at the blood drive. He denies syncope or palpitations.  Recent Labs: 05/29/2012: Hemoglobin 15.7 03/26/2011: TSH 0.641 04/17/2011: Potassium 4.2, creatinine 1.4  Wt Readings from Last 3 Encounters:  01/19/13 344 lb 6.4 oz (156.219 kg)  12/11/12 349 lb (158.305 kg)  05/29/12 344 lb (156.037 kg)     Past Medical History  Diagnosis Date  . Morbid obesity   . Atrial fibrillation     unable to tolerate versed and fentanyl sedation and required gen anesthesia for TEE;  s/p TEE-DCCV (failed);  Amiodarone started  . Chronic diastolic heart failure     Echocardiogram 03/27/11: EF 50-55%, inferior hypokinesis, moderate LAE, mild RVE, normal pulmonary pressures. ;   TEE (04/03/11): EF 40-45%, mild MR moderate LAE, no LAA clot, mild RAE  . Sleep apnea     probable;  needs sleep test  . Diabetes mellitus, type 2     Current Outpatient Prescriptions  Medication Sig Dispense Refill  . furosemide (LASIX) 40 MG tablet Take 1 tablet (40 mg total) by mouth daily.  30 tablet  3  . naproxen sodium (ANAPROX DS) 550 MG  tablet Take 1 tablet (550 mg total) by mouth 2 (two) times daily with a meal.  40 tablet  0  . warfarin (COUMADIN) 6 MG tablet Take as directed by Anticoagulation clinic.  20 tablet  0  . diltiazem (CARDIZEM CD) 240 MG 24 hr capsule Take 1 capsule (240 mg total) by mouth daily.  30 capsule  3  . potassium chloride SA (KLOR-CON M20) 20 MEQ tablet Take 1 tablet (20 mEq total) by mouth daily.  30 tablet  6   No current facility-administered medications for this visit.    Allergies:   Review of patient's allergies indicates no known allergies.   Social History:   The patient  reports that he has never smoked. He does not have any smokeless tobacco history on file. He reports that he drinks alcohol. He reports that he does not use illicit drugs.   Family History:  The patient's family history includes Diabetes in his father; Hypertension in his father and sister; Seizures in his mother.   ROS:  Please see the history of present illness.   He continues to describe a significant history of snoring as well as daytime hypersomnolence.   All other systems reviewed and negative.   PHYSICAL EXAM: VS:  BP 136/86  Pulse 118  Resp 22  Ht 6\' 2"  (1.88 m)  Wt 344 lb 6.4 oz (156.219 kg)  BMI 44.20 kg/m2 Well nourished, well developed, in no acute distress HEENT: normal Neck: I cannot assess JVD Cardiac:  normal S1, S2; RRR; no murmur Lungs:  Decreased breath sounds bilaterally, no wheezing, rhonchi or rales Abd: soft, nontender, no hepatomegaly Ext: trace-1+ bilateral LE edema Skin: warm and dry Neuro:  CNs 2-12 intact, no focal abnormalities noted  EKG:  Sinus tachycardia, HR 118, septal Q waves, no ischemic changes     ASSESSMENT AND PLAN:  1. Atrial Fibrillation:  He is currently in sinus rhythm. He previously was on amiodarone with plans to proceed with repeat TEE guided cardioversion. However, the patient was lost to follow up.  I am concerned about his compliance. Therefore, I am not certain that he is a good candidate for anticoagulation therapy.  CHADS2-VASc=2 (DM, CHF).  He would ultimately benefit from long-term anticoagulation. For now, I will place him on aspirin 81 mg daily. If he proves compliance, we can consider coumadin again in the future.  I will place him back on calcium channel blocker therapy with Cardizem CD 120 mg daily. 2. Abnormal Echocardiogram:  His EF by transthoracic echo was normal. His EF by TEE was 40-45%. There was inferior hypokinesis on his TTE. We initially had suggested that he undergo stress testing to rule out ischemic  heart disease. As he will likely have recurrent atrial fibrillation in the future, I will arrange a LexiScan Myoview to rule out ischemic heart disease. This will help guide antiarrhythmic therapy (if needed) in the future as well. 3. Diastolic CHF: Overall, his volume appears to be fairly stable. I will obtain a BNP today. I will have him continue on Lasix 20 mg a day. Check a basic metabolic panel today and repeat a basic metabolic panel in one week. Also check a CBC. 4. Diabetes Mellitus:  He needs follow up with PCP.  I will ask our office to refer him. 5. Obesity:  We discussed the importance of weight loss. 6. Snoring:  Arrange Split Night Sleep study.  I will refer him to sleep medicine once I have results. 7. Disposition:  F/u  with me in 2-3 weeks.   Signed, Tereso Newcomer, PA-C  01/19/2013 9:33 AM

## 2013-01-19 NOTE — Telephone Encounter (Signed)
lmptcb on pt's sister's phone since pt's voice mail not set up yet for ptcb for lab results, per Bing Neighbors. PA to start pt on metformin since he does est w/PCP until 02/2013.

## 2013-01-24 ENCOUNTER — Encounter: Payer: Self-pay | Admitting: Physician Assistant

## 2013-01-26 ENCOUNTER — Other Ambulatory Visit: Payer: BC Managed Care – PPO

## 2013-01-28 ENCOUNTER — Telehealth: Payer: Self-pay | Admitting: *Deleted

## 2013-01-28 MED ORDER — METFORMIN HCL 500 MG PO TABS: 500.0000 mg | ORAL_TABLET | Freq: Two times a day (BID) | ORAL | Status: DC

## 2013-01-28 NOTE — Telephone Encounter (Signed)
Mindi Junker who is pt's friend cb with Mr. Esperanza Richters in the back ground for verification. Both she and the pt have been notified about the results and to start metformin 500 mg bid. Marsha verblized understanding to instructions.

## 2013-01-28 NOTE — Telephone Encounter (Signed)
Pt not returning our calls in reference to lab results and to start metformin 500 mg 1 tablet twice daily # 60 x 1 per Bing Neighbors. PA since pt does not est with PCP until 02/11/13.

## 2013-02-07 ENCOUNTER — Ambulatory Visit (HOSPITAL_BASED_OUTPATIENT_CLINIC_OR_DEPARTMENT_OTHER): Payer: BC Managed Care – PPO | Attending: Physician Assistant

## 2013-02-07 VITALS — Ht 74.0 in | Wt 334.0 lb

## 2013-02-07 DIAGNOSIS — I4891 Unspecified atrial fibrillation: Secondary | ICD-10-CM

## 2013-02-07 DIAGNOSIS — G4733 Obstructive sleep apnea (adult) (pediatric): Secondary | ICD-10-CM | POA: Insufficient documentation

## 2013-02-07 DIAGNOSIS — R0683 Snoring: Secondary | ICD-10-CM

## 2013-02-08 ENCOUNTER — Ambulatory Visit (HOSPITAL_COMMUNITY): Payer: BC Managed Care – PPO | Attending: Cardiovascular Disease | Admitting: Radiology

## 2013-02-08 ENCOUNTER — Encounter: Payer: Self-pay | Admitting: Cardiovascular Disease

## 2013-02-08 DIAGNOSIS — R931 Abnormal findings on diagnostic imaging of heart and coronary circulation: Secondary | ICD-10-CM

## 2013-02-08 DIAGNOSIS — R0602 Shortness of breath: Secondary | ICD-10-CM

## 2013-02-08 DIAGNOSIS — R0989 Other specified symptoms and signs involving the circulatory and respiratory systems: Secondary | ICD-10-CM

## 2013-02-08 MED ORDER — TECHNETIUM TC 99M SESTAMIBI GENERIC - CARDIOLITE
30.0000 | Freq: Once | INTRAVENOUS | Status: AC | PRN
  Administered 2013-02-08: 30 via INTRAVENOUS

## 2013-02-11 ENCOUNTER — Encounter: Payer: Self-pay | Admitting: Family Medicine

## 2013-02-11 ENCOUNTER — Ambulatory Visit (INDEPENDENT_AMBULATORY_CARE_PROVIDER_SITE_OTHER): Payer: BC Managed Care – PPO | Admitting: Family Medicine

## 2013-02-11 VITALS — BP 122/84 | Temp 98.0°F | Ht 72.0 in | Wt 348.0 lb

## 2013-02-11 DIAGNOSIS — I4891 Unspecified atrial fibrillation: Secondary | ICD-10-CM

## 2013-02-11 DIAGNOSIS — E66813 Obesity, class 3: Secondary | ICD-10-CM

## 2013-02-11 DIAGNOSIS — R0989 Other specified symptoms and signs involving the circulatory and respiratory systems: Secondary | ICD-10-CM

## 2013-02-11 DIAGNOSIS — E1165 Type 2 diabetes mellitus with hyperglycemia: Principal | ICD-10-CM

## 2013-02-11 DIAGNOSIS — I5031 Acute diastolic (congestive) heart failure: Secondary | ICD-10-CM

## 2013-02-11 DIAGNOSIS — E1142 Type 2 diabetes mellitus with diabetic polyneuropathy: Secondary | ICD-10-CM

## 2013-02-11 DIAGNOSIS — R0609 Other forms of dyspnea: Secondary | ICD-10-CM

## 2013-02-11 DIAGNOSIS — IMO0002 Reserved for concepts with insufficient information to code with codable children: Secondary | ICD-10-CM

## 2013-02-11 DIAGNOSIS — E1149 Type 2 diabetes mellitus with other diabetic neurological complication: Secondary | ICD-10-CM

## 2013-02-11 LAB — LIPID PANEL
CHOLESTEROL: 167 mg/dL (ref 0–200)
HDL: 43.2 mg/dL (ref >39.00)
LDL Cholesterol: 98 mg/dL (ref 0–99)
Total CHOL/HDL Ratio: 4
Triglycerides: 131 mg/dL (ref 0.0–149.0)
VLDL: 26.2 mg/dL (ref 0.0–40.0)

## 2013-02-11 LAB — MICROALBUMIN / CREATININE URINE RATIO
CREATININE, U: 136.7 mg/dL
MICROALB UR: 4.2 mg/dL — AB (ref 0.0–1.9)
Microalb Creat Ratio: 3.1 mg/g (ref 0.0–30.0)

## 2013-02-11 LAB — HEMOGLOBIN A1C: HEMOGLOBIN A1C: 8.5 % — AB (ref 4.6–6.5)

## 2013-02-11 MED ORDER — METFORMIN HCL 1000 MG PO TABS: 1000.0000 mg | ORAL_TABLET | Freq: Two times a day (BID) | ORAL | Status: DC

## 2013-02-11 NOTE — Progress Notes (Signed)
Pre visit review using our clinic review tool, if applicable. No additional management support is needed unless otherwise documented below in the visit note. 

## 2013-02-11 NOTE — Progress Notes (Signed)
Chief Complaint  Patient presents with  . Establish Care    HPI:  Darius [name] is here to establish care. Has not seen primary care doc in a long time as did not have insurance. Sees Dr. Kathlen Mody in cardiology for his diastolic heart failure, OSA, A. Fib and was recently started on metformin: -patient reports he takes his medications only about a few days per week -reports he has follow up with cardiologist net week for stress test -reports he will start using the pill box -reports diet is poor  Has the following chronic problems and concerns today:  Patient Active Problem List   Diagnosis Date Noted  . Atrial Fibrillation 03/27/2011  . Obesity, Class III, BMI 40-49.9 (morbid obesity) 03/27/2011  . URI, acute 03/27/2011   Health Maintenance:  ROS: See pertinent positives and negatives per HPI.  Past Medical History  Diagnosis Date  . Morbid obesity   . Atrial fibrillation     unable to tolerate versed and fentanyl sedation and required gen anesthesia for TEE;  s/p TEE-DCCV (failed);  Amiodarone started  . Chronic diastolic heart failure     Echocardiogram 03/27/11: EF 50-55%, inferior hypokinesis, moderate LAE, mild RVE, normal pulmonary pressures. ;   TEE (04/03/11): EF 40-45%, mild MR moderate LAE, no LAA clot, mild RAE  . Sleep apnea     probable;  needs sleep test  . Diabetes mellitus, type 2     Family History  Problem Relation Age of Onset  . Diabetes Father     died in his 42s  . Hypertension Father     died in her 96s  . Seizures Mother   . Hypertension Sister     History   Social History  . Marital Status: Single    Spouse Name: N/A    Number of Children: N/A  . Years of Education: N/A   Occupational History  . Security Guard, third shift at [employer] History Main Topics  . Smoking status: Never Smoker   . Smokeless tobacco: None  . Alcohol Use: Yes     Comment: some  . Drug Use: No  . Sexual Activity: No   Other Topics Concern  .  None   Social History Narrative   Admits that he eats poorly and eats a lot at work.      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    Current outpatient prescriptions:aspirin EC 81 MG tablet, Take 1 tablet (81 mg total) by mouth daily., Disp: , Rfl: ;  diltiazem (CARDIZEM CD) 120 MG 24 hr capsule, Take 1 capsule (120 mg total) by mouth daily., Disp: 30 capsule, Rfl: 11;  furosemide (LASIX) 40 MG tablet, Take 0.5 tablets (20 mg total) by mouth daily., Disp: 30 tablet, Rfl: 11 metFORMIN (GLUCOPHAGE) 1000 MG tablet, Take 1 tablet (1,000 mg total) by mouth 2 (two) times daily with a meal., Disp: 60 tablet, Rfl: 3;  naproxen sodium (ANAPROX DS) 550 MG tablet, Take 1 tablet (550 mg total) by mouth 2 (two) times daily with a meal., Disp: 40 tablet, Rfl: 0  EXAM:  Filed Vitals:   02/11/13 1441  BP: 122/84  Temp: 98 F (36.7 C)    Body mass index is 47.19 kg/(m^2).  GENERAL: vitals reviewed and listed above, alert, oriented, appears well hydrated and in no acute distress  HEENT: atraumatic, conjunttiva clear, no obvious abnormalities on inspection of external nose and ears  NECK: no obvious masses on inspection  LUNGS: clear to auscultation bilaterally, no wheezes, rales or rhonchi, good air movement  CV: HRRR, no peripheral edema  MS: moves all extremities without noticeable abnormality  PSYCH: pleasant and cooperative, no obvious depression or anxiety  ASSESSMENT AND PLAN:  Discussed the following assessment and plan:  Diabetes mellitus with neurological manifestations, uncontrolled - Plan: metFORMIN (GLUCOPHAGE) 1000 MG tablet, Ambulatory referral to diabetic education, Hemoglobin A1c, Microalbumin/Creatinine Ratio, Urine  Obesity, Class III, BMI 40-49.9 (morbid obesity) - Plan: Lipid Panel  Atrial Fibrillation  Acute diastolic heart failure   -We reviewed the PMH, PSH, FH, SH, Meds and Allergies. -We provided refills for any medications we will  prescribe as needed. -We addressed current concerns per orders and patient instructions. -We have asked for records for pertinent exams, studies, vaccines and notes from previous providers. -We have advised patient to follow up per instructions below. -this patient rambled without stopping for the entire 30 minute appointment - I was able to interrupt several times to stress the importance of taking his medications daily, lifestyle changes, seeing the cardiologist and my overall concerns regarding his health given his weight, heart issues, diabetes NON-FASTING labs today -referral to diabetes educator -follow up in one month   -Patient advised to return or notify a doctor immediately if symptoms worsen or persist or new concerns arise.  Patient Instructions  We have ordered labs or studies at this visit. It can take up to 1-2 weeks for results and processing. We will contact you with instructions IF your results are abnormal. Normal results will be released to your [facility]. If you have not heard from [place] or can not find your results in [facility] in 2 weeks please contact our office.  We placed a referral for you as discussed to the diabetes educator. It usually takes about 1-2 weeks to process and schedule this referral. If you have not heard from [place] regarding this appointment in 2 weeks please contact our office.   GET A PILL BOX and take your medication every day  INCREASE THE METFORMIN TO 1000mg  twice daily  We recommend the following healthy lifestyle measures: - eat a healthy diet consisting of lots of vegetables, fruits, beans, nuts, seeds, healthy meats such as white chicken and fish and whole grains.  - avoid fried foods, fast food, processed foods, sodas, red meet and other fattening foods.  - get a least 150 minutes of aerobic exercise per week.   Follow up with your cardiologist as instructed  Follow up with me in 1 month to review labs           Inez Rosato, [facility] R.

## 2013-02-11 NOTE — Sleep Study (Signed)
   NAME: Darius [name] DATE OF BIRTH:  [birth year] MEDICAL RECORD NUMBER [id number]  LOCATION: [facility]  PHYSICIAN: Kathee Delton  DATE OF STUDY: 02/07/2013  SLEEP STUDY TYPE: Nocturnal Polysomnogram               REFERRING PHYSICIAN: Richardson Dopp T, PA-C  INDICATION FOR STUDY: Hypersomnia with sleep apnea  EPWORTH SLEEPINESS SCORE:  8 HEIGHT: 6\' 2"  (188 cm)  WEIGHT: 334 lb (151.501 kg)    Body mass index is 42.86 kg/(m^2).  NECK SIZE: 19.5 in.  SLEEP ARCHITECTURE: The patient had a total sleep time of 194 minutes, with no slow-wave sleep or REM. Sleep onset latency was prolonged at 63 minutes, and sleep efficiency was poor at 54%.  RESPIRATORY DATA: The patient was found to have 302 obstructive apneas, as well as 46 obstructive hypopneas. This gave him an AHI of 108 events per hour. The events occurred in all body positions, and there was loud snoring noted throughout. The patient did not meet split-night criteria secondary to a short total sleep time prior to 1 AM, and also most of his events occurring well after 1 AM.  OXYGEN DATA: There was oxygen desaturation as low as 70% with his obstructive events.  CARDIAC DATA: Frequent PVCs noted throughout  MOVEMENT/PARASOMNIA: There were no significant limb movements or abnormal behaviors noted.  IMPRESSION/ RECOMMENDATION:    1) very severe obstructive sleep apnea, with an AHI of 108 events per hour and oxygen desaturation as low as 70%. Treatment for this degree of sleep apnea should focus on CPAP as well as aggressive weight loss.  2) frequent PVCs noted throughout.     [place], American Board of Sleep Medicine  ELECTRONICALLY SIGNED ON:  02/11/2013, 10:39 AM [place] PH: [phone]   FX: [phone] [place]

## 2013-02-11 NOTE — Patient Instructions (Addendum)
We have ordered labs or studies at this visit. It can take up to 1-2 weeks for results and processing. We will contact you with instructions IF your results are abnormal. Normal results will be released to your [facility]. If you have not heard from [place] or can not find your results in [facility] in 2 weeks please contact our office.  We placed a referral for you as discussed to the diabetes educator. It usually takes about 1-2 weeks to process and schedule this referral. If you have not heard from [place] regarding this appointment in 2 weeks please contact our office.   GET A PILL BOX and take your medication every day  INCREASE THE METFORMIN TO 1000mg  twice daily  We recommend the following healthy lifestyle measures: - eat a healthy diet consisting of lots of vegetables, fruits, beans, nuts, seeds, healthy meats such as white chicken and fish and whole grains.  - avoid fried foods, fast food, processed foods, sodas, red meet and other fattening foods.  - get a least 150 minutes of aerobic exercise per week.   Follow up with your cardiologist as instructed  Follow up with me in 1 month to review labs

## 2013-02-15 ENCOUNTER — Ambulatory Visit: Payer: BC Managed Care – PPO | Admitting: Physician Assistant

## 2013-02-16 ENCOUNTER — Encounter: Payer: Self-pay | Admitting: Cardiovascular Disease

## 2013-02-16 ENCOUNTER — Ambulatory Visit (HOSPITAL_COMMUNITY): Payer: BC Managed Care – PPO | Attending: Cardiovascular Disease | Admitting: Radiology

## 2013-02-16 VITALS — BP 144/89 | Ht 74.0 in | Wt 345.0 lb

## 2013-02-16 DIAGNOSIS — R0609 Other forms of dyspnea: Secondary | ICD-10-CM | POA: Insufficient documentation

## 2013-02-16 DIAGNOSIS — E669 Obesity, unspecified: Secondary | ICD-10-CM | POA: Insufficient documentation

## 2013-02-16 DIAGNOSIS — R0602 Shortness of breath: Secondary | ICD-10-CM

## 2013-02-16 DIAGNOSIS — E119 Type 2 diabetes mellitus without complications: Secondary | ICD-10-CM | POA: Insufficient documentation

## 2013-02-16 DIAGNOSIS — R0600 Dyspnea, unspecified: Secondary | ICD-10-CM

## 2013-02-16 DIAGNOSIS — R0989 Other specified symptoms and signs involving the circulatory and respiratory systems: Principal | ICD-10-CM | POA: Insufficient documentation

## 2013-02-16 DIAGNOSIS — R002 Palpitations: Secondary | ICD-10-CM | POA: Insufficient documentation

## 2013-02-16 DIAGNOSIS — I4891 Unspecified atrial fibrillation: Secondary | ICD-10-CM | POA: Insufficient documentation

## 2013-02-16 DIAGNOSIS — I10 Essential (primary) hypertension: Secondary | ICD-10-CM | POA: Insufficient documentation

## 2013-02-16 MED ORDER — TECHNETIUM TC 99M SESTAMIBI GENERIC - CARDIOLITE
30.0000 | Freq: Once | INTRAVENOUS | Status: AC | PRN
  Administered 2013-02-16: 30 via INTRAVENOUS

## 2013-02-16 MED ORDER — REGADENOSON 0.4 MG/5ML IV SOLN
0.4000 mg | Freq: Once | INTRAVENOUS | Status: AC
  Administered 2013-02-16: 0.4 mg via INTRAVENOUS

## 2013-02-16 NOTE — Progress Notes (Addendum)
[place] [address] [place], [place] [address] [phone]    Cardiology Nuclear Med Study  Darius [name] is a 58 y.o. male     MRN : [id number]     DOB: 04-May-1955  Procedure Date: 02/16/2013  Nuclear Med Background Indication for Stress Test:  Evaluation for Ischemia History:  A-fib (Cardioversion 2013):Echo 2013 EF:40-45% Cardiac Risk Factors: Hypertension, NIDDM and Obesity  Symptoms:  DOE and Palpitations   Nuclear Pre-Procedure Caffeine/Decaff Intake:  None NPO After: 9:00pm   Lungs:  clear O2 Sat: 95% on room air. IV 0.9% NS with Angio Cath:  22g  IV Site: R Antecubital  IV Started by:  Perrin Maltese, EMT-P  Chest Size (in):  58 Cup Size: n/a  Height: 6\' 2"  (1.88 m)  Weight:  345 lb (156.491 kg)  BMI:  Body mass index is 44.28 kg/(m^2). Tech Comments:  No Rx this am    Nuclear Med Study 1 or 2 day study: 2 day  Stress Test Type:  Carlton Adam  Reading MD: N/A  Order Authorizing Provider:  Loralie Champagne, MD  Resting Radionuclide: Technetium 33m Sestamibi  Resting Radionuclide Dose: 33.0 mCi on 02/08/13   Stress Radionuclide:  Technetium 64m Sestamibi  Stress Radionuclide Dose: 33.0 mCi on 02/16/13           Stress Protocol Rest HR: 78 Stress HR: 95  Rest BP: 144/89 Stress BP: 133/91  Exercise Time (min): n/a METS: n/a   Predicted Max HR: 161 bpm % Max HR: 59.01 bpm Rate Pressure Product: 13680   Dose of Adenosine (mg):  n/a Dose of Lexiscan: 0.4 mg  Dose of Atropine (mg): n/a Dose of Dobutamine: n/a mcg/kg/min (at max HR)  Stress Test Technologist: Ileene Hutchinson, EMT-P  Nuclear Technologist:  Vedia Pereyra, CNMT     Rest Procedure:  Myocardial perfusion imaging was performed at rest 45 minutes following the intravenous administration of Technetium 49m Sestamibi. Rest ECG: NSR - Normal EKG  Stress Procedure:  The patient received IV Lexiscan 0.4 mg over 15-seconds.  Technetium 48m Sestamibi injected at 30-seconds.   Quantitative spect images were obtained after a 45 minute delay. Stress ECG: No significant change from baseline ECG  QPS Raw Data Images:  Normal; no motion artifact; normal heart/lung ratio. Stress Images:  Normal homogeneous uptake in all areas of the myocardium. Rest Images:  Normal homogeneous uptake in all areas of the myocardium. Subtraction (SDS):  Normal Transient Ischemic Dilatation (Normal <1.22):  1.01 Lung/Heart Ratio (Normal <0.45):  0.40  Quantitative Gated Spect Images QGS EDV:  168 ml QGS ESV:  82 ml  Impression Exercise Capacity:  Lexiscan with no exercise. BP Response:  Normal blood pressure response. Clinical Symptoms:  Warm and Lightheaded ECG Impression:  No significant ST segment change suggestive of ischemia. Comparison with Prior Nuclear Study: No images to compare  Overall Impression:  Low risk stress nuclear study No ischemia or infarct  Appears to be LVE with diffuse hypokinesis  Suggest MRI or echo correlation of EF.  LV Ejection Fraction: 51%.  LV Wall Motion:  Mildly decreased diffusely   [name]  No evidence for ischemia on the Cardiolite but EF is mildly decreased.  Would confirm this with echocardiogram. Make sure he has cardiology followup.   Loralie Champagne 02/17/2013

## 2013-02-18 ENCOUNTER — Telehealth: Payer: Self-pay | Admitting: *Deleted

## 2013-02-18 ENCOUNTER — Encounter: Payer: Self-pay | Admitting: Physician Assistant

## 2013-02-18 DIAGNOSIS — G4733 Obstructive sleep apnea (adult) (pediatric): Secondary | ICD-10-CM

## 2013-02-18 DIAGNOSIS — I4891 Unspecified atrial fibrillation: Secondary | ICD-10-CM

## 2013-02-18 NOTE — Telephone Encounter (Signed)
pt notified about both sleep study and myoview results, appt made for echo 1/14, f/u w/SW/DM 1/26..cmf

## 2013-02-18 NOTE — Telephone Encounter (Signed)
lmptcb for sleep study results 

## 2013-02-18 NOTE — Sleep Study (Signed)
Very severe sleep apnea noted on sleep study. Refer to pulmonary for treatment of sleep apnea. Richardson Dopp, PA-C   02/18/2013 1:55 PM

## 2013-02-21 NOTE — Progress Notes (Signed)
See phone note 02/18/13 

## 2013-02-23 ENCOUNTER — Encounter: Payer: Self-pay | Admitting: Physician Assistant

## 2013-02-23 ENCOUNTER — Encounter: Payer: Self-pay | Admitting: Cardiology

## 2013-02-23 ENCOUNTER — Ambulatory Visit (HOSPITAL_COMMUNITY): Payer: BC Managed Care – PPO | Attending: Cardiology | Admitting: Radiology

## 2013-02-23 ENCOUNTER — Telehealth: Payer: Self-pay | Admitting: *Deleted

## 2013-02-23 DIAGNOSIS — E119 Type 2 diabetes mellitus without complications: Secondary | ICD-10-CM | POA: Insufficient documentation

## 2013-02-23 DIAGNOSIS — I509 Heart failure, unspecified: Secondary | ICD-10-CM | POA: Insufficient documentation

## 2013-02-23 DIAGNOSIS — G4730 Sleep apnea, unspecified: Secondary | ICD-10-CM | POA: Insufficient documentation

## 2013-02-23 DIAGNOSIS — I4891 Unspecified atrial fibrillation: Secondary | ICD-10-CM | POA: Insufficient documentation

## 2013-02-23 NOTE — Progress Notes (Signed)
Echocardiogram performed.  

## 2013-02-23 NOTE — Telephone Encounter (Signed)
Follow Up  Pt returned call for results//SR

## 2013-02-23 NOTE — Telephone Encounter (Signed)
lmptcb for echo results 

## 2013-02-24 NOTE — Telephone Encounter (Signed)
lmptcb x 2 for echo results 

## 2013-03-01 ENCOUNTER — Encounter: Payer: Self-pay | Admitting: *Deleted

## 2013-03-01 NOTE — Telephone Encounter (Signed)
tried to reach pt x 3 about echo results, unsuccessful in my attempts. I will send out a results letter today to pt.

## 2013-03-07 ENCOUNTER — Encounter: Payer: BC Managed Care – PPO | Admitting: Physician Assistant

## 2013-03-07 NOTE — Progress Notes (Signed)
This encounter was created in error - please disregard.

## 2013-03-07 NOTE — Progress Notes (Deleted)
[address] [place], [place]  [address] Phone: [phone] Fax:  [phone]  Date:  03/07/2013   ID:  Darius [name], DOB 02-01-1956, MRN [id number]  PCP:  [name]., DO  Cardiologist:  Dr. Loralie Champagne     History of Present Illness: Darius [name] is a 58 y.o. male with a hx of DM, AFib and diastolic CHF. Patient was initially evaluated in the hospital in 03/2011 with new-onset AFib with RVR in the setting of possible pneumonia as well as diastolic heart failure.  Echo 03/27/11: EF 50-55%, inferior HK, mod LAE, mild RVE, normal pulmonary pressures.  He underwent TEE guided cardioversion. TEE (04/03/11): EF 40-45%, mild MR moderate LAE, no LAA clot, mild RAE.  TEE had to be performed under general anesthesia as he became combative with the standard approach. However, he went back into AFib in less than 24 hours. He was started on amiodarone to help restore NSR with the thought that NSR would shrink his LA some and make him eligible for PVI ablation. Coumadin was used for anticoagulation as he could not afford Xarelto or Pradaxa. He needed an outpatient stress test due to his wall motion abnormality to help guide further anti-arrhythmic choices. Of note, his QTc was too long for Tikosyn. He also needed a sleep study arranged.  He was lost to follow up.  I saw him 01/19/13.  He had been to urgent care and placed back on Lasix due to LE edema.  He was in sinus tachy.  I did not put him on coumadin due to non-compliance.  I felt we could consider resuming this (CHADS2-VASc=2) if he maintained appropriate follow up.  I resumed Diltiazem 120 QD.  Myoview was done and demonstrated no ischemia, EF 51%.  Echo was done and demonstrated normal LVF, moderate diastolic dysfunction, mild LAE and mild dilated Ao root.  I set him up for a sleep study that demonstrated very severe OSA and he has been referred to pulmonology.  He has also been set up with primary care for his diabetes.   He returns for  follow up.  ***  Recent Labs: 01/19/2013: Creatinine 0.9; Hemoglobin 16.5; Potassium 4.2; Pro B Natriuretic peptide (BNP) 24.0  02/11/2013: HDL Cholesterol 43.20; LDL (calc) 98 03/26/2011: TSH 0.641 04/17/2011: Potassium 4.2, creatinine 1.4  Wt Readings from Last 3 Encounters:  02/16/13 345 lb (156.491 kg)  02/11/13 348 lb (157.852 kg)  02/07/13 334 lb (151.501 kg)     Past Medical History  Diagnosis Date  . Morbid obesity   . Atrial fibrillation     unable to tolerate versed and fentanyl sedation and required gen anesthesia for TEE;  s/p TEE-DCCV (failed);  Amiodarone started  . Chronic diastolic heart failure     a. Echo 03/27/11: EF 50-55%, inferior hypokinesis, moderate LAE, mild RVE, normal pulmonary pressures. ;   b.  TEE (04/03/11): EF 40-45%, mild MR moderate LAE, no LAA clot, mild RAE;  c.  Echocardiogram (02/2013): EF 60-65%, Gr 2 DD, mildly dilated Ao root (Ao root dimension 39 mm), MAC, mild LAE, normal RVF  . Sleep apnea     a. sleep test 01/2013:  very severy OSA  . Diabetes mellitus, type 2   . Hx of cardiovascular stress test     Lexiscan Myoview (02/2013): No ischemia or scar, EF 51%, low risk    Current Outpatient Prescriptions  Medication Sig Dispense Refill  . aspirin EC 81 MG tablet Take 1 tablet (81 mg  total) by mouth daily.      Marland Kitchen diltiazem (CARDIZEM CD) 120 MG 24 hr capsule Take 1 capsule (120 mg total) by mouth daily.  30 capsule  11  . furosemide (LASIX) 40 MG tablet Take 0.5 tablets (20 mg total) by mouth daily.  30 tablet  11  . metFORMIN (GLUCOPHAGE) 1000 MG tablet Take 1 tablet (1,000 mg total) by mouth 2 (two) times daily with a meal.  60 tablet  3  . naproxen sodium (ANAPROX DS) 550 MG tablet Take 1 tablet (550 mg total) by mouth 2 (two) times daily with a meal.  40 tablet  0   No current facility-administered medications for this visit.    Allergies:   Review of patient's allergies indicates no known allergies.   Social History:  The patient  reports  that he has never smoked. He does not have any smokeless tobacco history on file. He reports that he drinks alcohol. He reports that he does not use illicit drugs.   Family History:  The patient's family history includes Diabetes in his father; Hypertension in his father and sister; Seizures in his mother.   ROS:  Please see the history of present illness.   ***   All other systems reviewed and negative.   PHYSICAL EXAM: VS:  There were no vitals taken for this visit. Well nourished, well developed, in no acute distress HEENT: normal Neck: I cannot assess JVD Cardiac:  normal S1, S2; RRR; no murmur Lungs:  Decreased breath sounds bilaterally, no wheezing, rhonchi or rales Abd: soft, nontender, no hepatomegaly Ext: trace-1+ bilateral LE edema Skin: warm and dry Neuro:  CNs 2-12 intact, no focal abnormalities noted  EKG:  ***    ASSESSMENT AND PLAN:  1. Atrial Fibrillation:  *** 2. Chronic Diastolic CHF: *** 3. Diabetes Mellitus:  *** 4. Obesity:  ***We discussed the importance of weight loss. 5. Sleep Apnea:  *** 6. Disposition:  F/u with ***   Signed, Richardson Dopp, PA-C  03/07/2013 8:11 AM

## 2013-03-11 ENCOUNTER — Encounter: Payer: Self-pay | Admitting: Physician Assistant

## 2013-03-14 ENCOUNTER — Encounter: Payer: Self-pay | Admitting: Family Medicine

## 2013-03-14 ENCOUNTER — Ambulatory Visit (INDEPENDENT_AMBULATORY_CARE_PROVIDER_SITE_OTHER): Payer: BC Managed Care – PPO | Admitting: Family Medicine

## 2013-03-14 VITALS — BP 118/78 | Temp 98.6°F | Wt 341.0 lb

## 2013-03-14 DIAGNOSIS — E1149 Type 2 diabetes mellitus with other diabetic neurological complication: Secondary | ICD-10-CM

## 2013-03-14 DIAGNOSIS — E1142 Type 2 diabetes mellitus with diabetic polyneuropathy: Secondary | ICD-10-CM

## 2013-03-14 DIAGNOSIS — E1165 Type 2 diabetes mellitus with hyperglycemia: Principal | ICD-10-CM

## 2013-03-14 DIAGNOSIS — IMO0002 Reserved for concepts with insufficient information to code with codable children: Secondary | ICD-10-CM

## 2013-03-14 NOTE — Progress Notes (Signed)
Pre visit review using our clinic review tool, if applicable. No additional management support is needed unless otherwise documented below in the visit note. 

## 2013-03-14 NOTE — Patient Instructions (Signed)
-  See the diabetes educator  -schedule an appointment with the eye doctor  We recommend the following healthy lifestyle measures: - eat a healthy diet consisting of lots of vegetables, fruits, beans, nuts, seeds, healthy meats such as white chicken and fish and whole grains.  - avoid fried foods, fast food, processed foods, sodas, red meet and other fattening foods.  - get a least 150 minutes of aerobic exercise per week.   -We placed a referral for you as discussed to the podiatrist. It usually takes about 1-2 weeks to process and schedule this referral. If you have not heard from [place] regarding this appointment in 2 weeks please contact our office.  Follow up in 2-3 months

## 2013-03-14 NOTE — Progress Notes (Signed)
Chief Complaint  Patient presents with  . Follow-up    HPI:  Follow up:  DM:  Lab Results  Component Value Date   HGBA1C 8.5* 02/11/2013  -new pt to [place] with poor compliance with recs in the past per notes -advised seeing diabetes educator and taking metformin increased to 1000 mg bid last visit -he reports: feels fine, occ cold feet, appt is scheduled with the diabetes educator for next week -denies: CP, SOB, polyuria, polydipsia -eye exam: has not seen an eye doctor in a long time -foot exam:  Sees cardiologist for A. Fib, CHF, OSA Has follow up scheduled   ROS: See pertinent positives and negatives per HPI.  Past Medical History  Diagnosis Date  . Morbid obesity   . Atrial fibrillation     unable to tolerate versed and fentanyl sedation and required gen anesthesia for TEE;  s/p TEE-DCCV (failed);  Amiodarone started  . Chronic diastolic heart failure     a. Echo 03/27/11: EF 50-55%, inferior hypokinesis, moderate LAE, mild RVE, normal pulmonary pressures. ;   b.  TEE (04/03/11): EF 40-45%, mild MR moderate LAE, no LAA clot, mild RAE;  c.  Echocardiogram (02/2013): EF 60-65%, Gr 2 DD, mildly dilated Ao root (Ao root dimension 39 mm), MAC, mild LAE, normal RVF  . Sleep apnea     a. sleep test 01/2013:  very severy OSA  . Diabetes mellitus, type 2   . Hx of cardiovascular stress test     Lexiscan Myoview (02/2013): No ischemia or scar, EF 51%, low risk    Past Surgical History  Procedure Laterality Date  . Tee without cardioversion  04/01/2011    Procedure: TRANSESOPHAGEAL ECHOCARDIOGRAM (TEE);  Surgeon: Lelon Perla, MD;  Location: [facility] ENDOSCOPY;  Service: Cardiovascular;  Laterality: N/A;  . Cardioversion  04/01/2011    Procedure: CARDIOVERSION;  Surgeon: Lelon Perla, MD;  Location: [facility] ENDOSCOPY;  Service: Cardiovascular;  Laterality: N/A;  . Tee without cardioversion  04/03/2011    Procedure: TRANSESOPHAGEAL ECHOCARDIOGRAM (TEE);  Surgeon: Lelon Perla, MD;   Location: [facility] ENDOSCOPY;  Service: Cardiovascular;  Laterality: N/A;  . Cardioversion  04/03/2011    Procedure: CARDIOVERSION;  Surgeon: Lelon Perla, MD;  Location: [facility] ENDOSCOPY;  Service: Cardiovascular;  Laterality: N/A;  . Tee without cardioversion  04/03/2011    Procedure: TRANSESOPHAGEAL ECHOCARDIOGRAM (TEE);  Surgeon: Lelon Perla, MD;  Location: [place];  Service: Cardiovascular;  Laterality: N/A;  . Cardioversion  04/03/2011    Procedure: CARDIOVERSION;  Surgeon: Lelon Perla, MD;  Location: [facility] OR;  Service: Cardiovascular;  Laterality: N/A;    Family History  Problem Relation Age of Onset  . Diabetes Father     died in his 46s  . Hypertension Father     died in her 31s  . Seizures Mother   . Hypertension Sister     History   Social History  . Marital Status: Single    Spouse Name: N/A    Number of Children: N/A  . Years of Education: N/A   Occupational History  . Security Guard, third shift at [employer] History Main Topics  . Smoking status: Never Smoker   . Smokeless tobacco: None  . Alcohol Use: Yes     Comment: some  . Drug Use: No  . Sexual Activity: No   Other Topics Concern  . None   Social History Narrative   Admits that he eats poorly and eats a  lot at work.      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    Current outpatient prescriptions:aspirin EC 81 MG tablet, Take 1 tablet (81 mg total) by mouth daily., Disp: , Rfl: ;  diltiazem (CARDIZEM CD) 120 MG 24 hr capsule, Take 1 capsule (120 mg total) by mouth daily., Disp: 30 capsule, Rfl: 11;  furosemide (LASIX) 40 MG tablet, Take 0.5 tablets (20 mg total) by mouth daily., Disp: 30 tablet, Rfl: 11 metFORMIN (GLUCOPHAGE) 1000 MG tablet, Take 1 tablet (1,000 mg total) by mouth 2 (two) times daily with a meal., Disp: 60 tablet, Rfl: 3;  naproxen sodium (ANAPROX DS) 550 MG tablet, Take 1 tablet (550 mg total) by mouth 2 (two) times daily with a meal., Disp: 40 tablet,  Rfl: 0  EXAM:  Filed Vitals:   03/14/13 1338  BP: 118/78  Temp: 98.6 F (37 C)    Body mass index is 43.76 kg/(m^2).  GENERAL: vitals reviewed and listed above, alert, oriented, appears well hydrated and in no acute distress  HEENT: atraumatic, conjunttiva clear, no obvious abnormalities on inspection of external nose and ears  NECK: no obvious masses on inspection  LUNGS: clear to auscultation bilaterally, no wheezes, rales or rhonchi, good air movement  CV: HRRR, no peripheral edema  MS: moves all extremities without noticeable abnormality  PSYCH: pleasant and cooperative, no obvious depression or anxiety  Foot Exam: see comments  ASSESSMENT AND PLAN:  Discussed the following assessment and plan:  Uncontrolled type 2 diabetes mellitus with peripheral neuropathy - Plan: Ambulatory referral to Podiatry  -this patient does not stop talking and it is somewhat difficult to provide him with information  -advised lifestyle changes, likely addition of a nother medication for his diabetes, diabetes education, podiatrist for his feet -he has follow up with his cardiologist -follow up 2 months and will recheck hgba1c at that time -Patient advised to return or notify a doctor immediately if symptoms worsen or persist or new concerns arise.  Patient Instructions  -See the diabetes educator  -schedule an appointment with the eye doctor  We recommend the following healthy lifestyle measures: - eat a healthy diet consisting of lots of vegetables, fruits, beans, nuts, seeds, healthy meats such as white chicken and fish and whole grains.  - avoid fried foods, fast food, processed foods, sodas, red meet and other fattening foods.  - get a least 150 minutes of aerobic exercise per week.   -We placed a referral for you as discussed to the podiatrist. It usually takes about 1-2 weeks to process and schedule this referral. If you have not heard from [place] regarding this appointment in 2  weeks please contact our office.  Follow up in 2-3 months        KIM, HANNAH R.

## 2013-03-17 ENCOUNTER — Telehealth: Payer: Self-pay

## 2013-03-17 NOTE — Telephone Encounter (Signed)
Relevant patient education mailed to patient.  

## 2013-03-22 ENCOUNTER — Ambulatory Visit (INDEPENDENT_AMBULATORY_CARE_PROVIDER_SITE_OTHER): Payer: BC Managed Care – PPO | Admitting: Pulmonary Disease

## 2013-03-22 ENCOUNTER — Encounter: Payer: Self-pay | Admitting: Pulmonary Disease

## 2013-03-22 VITALS — BP 126/88 | HR 100 | Temp 97.9°F | Ht 72.0 in | Wt 337.0 lb

## 2013-03-22 DIAGNOSIS — G4733 Obstructive sleep apnea (adult) (pediatric): Secondary | ICD-10-CM | POA: Insufficient documentation

## 2013-03-22 NOTE — Patient Instructions (Signed)
Will start on cpap at a moderate pressure.  Let us know if having issues with tolerance  Work on weight loss followup with me in 8 weeks.

## 2013-03-22 NOTE — Progress Notes (Signed)
Subjective:    Patient ID: Darius [name], male    DOB: Jan 03, 1956, 58 y.o.   MRN: [id number]  HPI The patient is a 58 year old male who I've been asked to see for management of obstructive sleep apnea. He underwent a sleep study the end of December, and was found to have severe OSA, with an AHI of 108 events per hour. He had oxygen desaturation as low as 70%. The patient has been noted to have loud snoring, and he admits to having gasping arousals. He has frequent awakenings at night, and is not rested in the mornings upon arising. 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 He also has sleepiness with driving at times. The patient states that his weight is up over the last 2 years, but is unsure how much. His Epworth score today is abnormal at 13.   Sleep Questionnaire What time do you typically go to bed?( Between what hours) 2am 2am at 0924 on 03/22/13 by Virl Cagey, CMA How long does it take you to fall asleep? within minutes within minutes at 0924 on 03/22/13 by Virl Cagey, CMA How many times during the night do you wake up? 2 2 at 0924 on 03/22/13 by Virl Cagey, CMA What time do you get out of bed to start your day? No Value [id number] at 0924 on 03/22/13 by Virl Cagey, CMA Do you drive or operate heavy machinery in your occupation? No No at 0924 on 03/22/13 by Virl Cagey, CMA How much has your weight changed (up or down) over the past two years? (In pounds) 4 lb (1.814 kg) 4 lb (1.814 kg) at 0924 on 03/22/13 by Virl Cagey, CMA Have you ever had a sleep study before? Yes Yes at 0924 on 03/22/13 by Virl Cagey, CMA If yes, location of study? [facility] at 0924 on 03/22/13 by Virl Cagey, CMA If yes, date of study? 02/17/13 02/17/13 at 0924 on 03/22/13 by Virl Cagey, CMA Do you currently use CPAP? No No at 0924 on 03/22/13 by Virl Cagey, CMA Do you wear oxygen at  any time? No No at 0924 on 03/22/13 by Virl Cagey, CMA   Review of Systems  Constitutional: Negative for fever and unexpected weight change.  HENT: Positive for dental problem. Negative for congestion, ear pain, nosebleeds, postnasal drip, rhinorrhea, sinus pressure, sneezing, sore throat and trouble swallowing.   Eyes: Negative for redness and itching.  Respiratory: Positive for shortness of breath. Negative for cough, chest tightness and wheezing.   Cardiovascular: Positive for palpitations. Negative for leg swelling.  Gastrointestinal: Negative for nausea and vomiting.  Genitourinary: Negative for dysuria.  Musculoskeletal: Negative for joint swelling.  Skin: Negative for rash.  Neurological: Negative for headaches.  Hematological: Does not bruise/bleed easily.  Psychiatric/Behavioral: Negative for dysphoric mood. The patient is not nervous/anxious.        Objective:   Physical Exam Constitutional:  Morbidly obese male, no acute distress  HENT:  Nares patent without discharge  Oropharynx without exudate, palate and uvula are thick and elongated, small mouth and large tongue, small mandible with overbite.  Eyes:  Perrla, eomi, no scleral icterus  Neck:  No JVD, no TMG  Cardiovascular:  Normal rate, regular rhythm, no rubs or gallops.  No murmurs        Intact distal pulses  Pulmonary :  Normal breath sounds, no stridor or  respiratory distress   No rales, rhonchi, or wheezing  Abdominal:  Soft, nondistended, bowel sounds present.  No tenderness noted.   Musculoskeletal:  mild lower extremity edema noted.  Lymph Nodes:  No cervical lymphadenopathy noted  Skin:  No cyanosis noted  Neurologic:  Alert, appropriate, moves all 4 extremities without obvious deficit.         Assessment & Plan:

## 2013-03-22 NOTE — Assessment & Plan Note (Signed)
The patient has been diagnosed with very severe obstructive sleep apnea, and I have reviewed the results with him. He will obviously need a trial of CPAP while working on aggressive weight loss. I have also reviewed with him the pathophysiology of sleep apnea, including its impact to his quality of life and cardiovascular health. He is willing to give this a try.

## 2013-03-23 ENCOUNTER — Ambulatory Visit: Payer: BC Managed Care – PPO | Admitting: *Deleted

## 2013-03-31 ENCOUNTER — Ambulatory Visit: Payer: BC Managed Care – PPO | Admitting: Podiatrist

## 2013-03-31 ENCOUNTER — Ambulatory Visit (INDEPENDENT_AMBULATORY_CARE_PROVIDER_SITE_OTHER): Payer: BC Managed Care – PPO | Admitting: Podiatrist

## 2013-03-31 ENCOUNTER — Encounter: Payer: Self-pay | Admitting: Podiatrist

## 2013-03-31 VITALS — BP 164/86 | HR 86 | Resp 12

## 2013-03-31 DIAGNOSIS — M79609 Pain in unspecified limb: Secondary | ICD-10-CM

## 2013-03-31 DIAGNOSIS — B351 Tinea unguium: Secondary | ICD-10-CM

## 2013-03-31 NOTE — Progress Notes (Signed)
   Subjective:    Patient ID: Darius [name], male    DOB: January 06, 1956, 58 y.o.   MRN: [id number]  HPI   '' TOENAILS ARE LONG AND THICK. ALSO CHECK FOR CIRCULATION ON MY FEET BECAUSE OF THE DIABETES.''   Review of Systems  Constitutional: Positive for activity change.  Cardiovascular: Positive for leg swelling.  Genitourinary: Positive for frequency.  Skin: Positive for color change.  All other systems reviewed and are negative.       Objective:   Physical Exam GENERAL APPEARANCE: Alert, conversant. Appropriately groomed. No acute distress.  VASCULAR: Pedal pulses palpable at 2/4 DP and PT bilateral.  Capillary refill time is immediate to all digits,  Proximal to distal cooling it warm to warm.  Digital hair growth is present bilateral  NEUROLOGIC: sensation is intact epicritically and protectively to 5.07 monofilament at 2/5 sites bilateral-- plaques of skin are present on the plantar pressure surfaces of bilateral feet which may be decreasing sensation on these areas.  Light touch is intact bilateral, vibratory sensation intact bilateral, achilles tendon reflex is intact bilateral.  MUSCULOSKELETAL: acceptable muscle strength, tone and stability bilateral.  Intrinsic muscluature intact bilateral. haglunds deformity noted bilateral  DERMATOLOGIC: skin plaques plantar surfaces of bilateral feet are present,  All nails are long, thick, mycotic and symptomatic x 10         Assessment & Plan:  Diabetes,  mhycotic toenails,   Discussed etiology, pathology, conservative vs. Surgical therapies and at this time debridement of symptomatic toenails was recommended.  Onychoreduction of symptomatic toenails was performed without iatrogenic incident.  Patient was instructed on signs and symptoms of infection and was told to call immediately should any of these arise. Gave instructions for care of plantar skin plaques with urea cream and pumice stone

## 2013-03-31 NOTE — Patient Instructions (Signed)
Diabetes and Foot Care Diabetes may cause you to have problems because of poor blood supply (circulation) to your feet and legs. This may cause the skin on your feet to become thinner, break easier, and heal more slowly. Your skin may become dry, and the skin may peel and crack. You may also have nerve damage in your legs and feet causing decreased feeling in them. You may not notice minor injuries to your feet that could lead to infections or more serious problems. Taking care of your feet is one of the most important things you can do for yourself.  HOME CARE INSTRUCTIONS  Wear shoes at all times, even in the house. Do not go barefoot. Bare feet are easily injured.  Check your feet daily for blisters, cuts, and redness. If you cannot see the bottom of your feet, use a mirror or ask someone for help.  Wash your feet with warm water (do not use hot water) and mild soap. Then pat your feet and the areas between your toes until they are completely dry. Do not soak your feet as this can dry your skin.  Apply a moisturizing lotion or petroleum jelly (that does not contain alcohol and is unscented) to the skin on your feet and to dry, brittle toenails. Do not apply lotion between your toes.  Trim your toenails straight across. Do not dig under them or around the cuticle. File the edges of your nails with an emery board or nail file.  Do not cut corns or calluses or try to remove them with medicine.  Wear clean socks or stockings every day. Make sure they are not too tight. Do not wear knee-high stockings since they may decrease blood flow to your legs.  Wear shoes that fit properly and have enough cushioning. To break in new shoes, wear them for just a few hours a day. This prevents you from injuring your feet. Always look in your shoes before you put them on to be sure there are no objects inside.  Do not cross your legs. This may decrease the blood flow to your feet.  If you find a minor scrape,  cut, or break in the skin on your feet, keep it and the skin around it clean and dry. These areas may be cleansed with mild soap and water. Do not cleanse the area with peroxide, alcohol, or iodine.  When you remove an adhesive bandage, be sure not to damage the skin around it.  If you have a wound, look at it several times a day to make sure it is healing.  Do not use heating pads or hot water bottles. They may burn your skin. If you have lost feeling in your feet or legs, you may not know it is happening until it is too late.  Make sure your health care provider performs a complete foot exam at least annually or more often if you have foot problems. Report any cuts, sores, or bruises to your health care provider immediately. SEEK MEDICAL CARE IF:   You have an injury that is not healing.  You have cuts or breaks in the skin.  You have an ingrown nail.  You notice redness on your legs or feet.  You feel burning or tingling in your legs or feet.  You have pain or cramps in your legs and feet.  Your legs or feet are numb.  Your feet always feel cold. SEEK IMMEDIATE MEDICAL CARE IF:   There is increasing redness,   swelling, or pain in or around a wound.  There is a red line that goes up your leg.  Pus is coming from a wound.  You develop a fever or as directed by your health care provider.  You notice a bad smell coming from an ulcer or wound. Document Released: 01/25/2000 Document Revised: 09/29/2012 Document Reviewed: 07/06/2012 [employer] Patient Information 2014 [employer].  

## 2013-03-31 NOTE — Progress Notes (Deleted)
Nails, plaques bilat, haglunds bilat

## 2013-04-05 ENCOUNTER — Other Ambulatory Visit: Payer: Self-pay | Admitting: Physician Assistant

## 2013-04-13 ENCOUNTER — Encounter: Payer: Self-pay | Admitting: *Deleted

## 2013-04-13 ENCOUNTER — Encounter: Payer: BC Managed Care – PPO | Attending: Family Medicine | Admitting: *Deleted

## 2013-04-13 VITALS — Ht 74.0 in | Wt 342.2 lb

## 2013-04-13 DIAGNOSIS — E119 Type 2 diabetes mellitus without complications: Secondary | ICD-10-CM | POA: Insufficient documentation

## 2013-04-13 DIAGNOSIS — Z713 Dietary counseling and surveillance: Secondary | ICD-10-CM | POA: Insufficient documentation

## 2013-04-13 NOTE — Progress Notes (Signed)
Appt start time: 1630 end time:  1530.  Assessment:  Patient was seen on  04/13/13 for individual diabetes education. Initial diagnosis T2DM 03/2011 while hospitalized for cardiac issues. 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 Lives alone, has sister in [place], speaks to her every morning. That way they know he is still alive. Drives himself. Notes decreased circulation in legs. Family member lost toes due to diabetes. He does not want to do that. Darius [name] has difficulty focusing on our conversations. He speaks continually diverting away from my topic of conversation. I was unable to provide standard diabetes education.  Current HbA1c: 8.5%  Preferred Learning Style:   No preference indicated   Learning Readiness:   Not Ready  MEDICATIONS: See List, Metformin  DIETARY INTAKE:  B ( AM): cereal 4C , whole milk, instant oatmeal 2 packs   Snk ( AM): cheese puffs  L ( PM): chinese buffett, microwave meal, sandwich Snk ( PM): pistachios  D ( PM): soup Snk ( PM): yogurt regular Whitman Sampler candy, [employer] Beverages: sweet tea , sugar, regular soda, fruit juice  Usual physical activity: none   Intervention:  Nutrition counseling provided.  Provided education on macronutrients on glucose levels.  Provided education on carb counting, importance of regularly scheduled meals/snacks, and meal planning  Discussed effects of physical activity on glucose levels and long-term glucose control.  Recommended 150 minutes of physical activity/week.  Reviewed patient medications.  Discussed role of medication on blood glucose and possible side effects  PLAN Consider decrease eating sweets:  Sweet Tea .Marland Kitchen..notes possible to decrease Regular Soda ........ Notes possible to decrease D[employer]in [employer]uts Krispy Cream Whitman Sampler Little Debbie oatmeal pie [employer] Eating ice cream directly from the container  Consider snacking on [facility] [employer]  Teaching Method  Utilized:  Visual Auditory Hands on  Handouts given during visit include: Living Well with Diabetes  Barriers to learning/adherence to lifestyle change: Interest/motivation, I question Mr. Trudo' willingness to make the recommended dietary modifications necessary to impact his T2DM. He is not interested in substituting for his regular   Diabetes self-care support plan:   [facility] support group  PCP  Sister  Demonstrated degree of understanding via:  Verbalized understanding  Monitoring/Evaluation:  Dietary intake, exercise, and body weight return for f/u  prn.

## 2013-04-13 NOTE — Patient Instructions (Addendum)
Consider decrease eating sweets:  Sweet Tea Regular Soda Dunkin [employer]uts Krispy Cream Whitman Sampler Little Debbie oatmeal pie Cookies Candy  Consider snacking on [employer]

## 2013-04-15 ENCOUNTER — Encounter: Payer: Self-pay | Admitting: *Deleted

## 2013-05-12 ENCOUNTER — Encounter: Payer: BC Managed Care – PPO | Admitting: Family Medicine

## 2013-05-12 DIAGNOSIS — Z0289 Encounter for other administrative examinations: Secondary | ICD-10-CM

## 2013-05-12 NOTE — Progress Notes (Signed)
Error   This encounter was created in error - please disregard. 

## 2013-05-17 ENCOUNTER — Ambulatory Visit (INDEPENDENT_AMBULATORY_CARE_PROVIDER_SITE_OTHER): Payer: BC Managed Care – PPO | Admitting: Pulmonary Disease

## 2013-05-17 ENCOUNTER — Encounter: Payer: Self-pay | Admitting: Pulmonary Disease

## 2013-05-17 ENCOUNTER — Encounter (INDEPENDENT_AMBULATORY_CARE_PROVIDER_SITE_OTHER): Payer: Self-pay

## 2013-05-17 VITALS — BP 128/80 | HR 116 | Temp 98.7°F | Ht 74.0 in | Wt 342.8 lb

## 2013-05-17 DIAGNOSIS — G4733 Obstructive sleep apnea (adult) (pediatric): Secondary | ICD-10-CM

## 2013-05-17 NOTE — Assessment & Plan Note (Signed)
The pt is doing well from a sleep apnea standpoint with his CPAP, and his download shows excellent compliance.  He will need to stay on the auto setting, but we will need to increase his high pressure limit to 20 cm. I also encouraged him to work aggressively on weight loss.

## 2013-05-17 NOTE — Patient Instructions (Signed)
Will change your machine to the auto setting so that it self adjusts to what you need during the night.  Work on weight loss.  followup with me in 75mos.

## 2013-05-17 NOTE — Progress Notes (Signed)
   Subjective:    Patient ID: Salvador Coupe, male    DOB: 05-Jan-1956, 58 y.o.   MRN: [id number]  HPI Patient comes in today for followup of his obstructive sleep apnea. He is on CPAP with the auto setting, but his high pressure limit is at only 15 cm. His download shows breakthrough events at the highest current pressure. He feels he is doing very well with the device, and is having no issues with mask leak. He feels he is sleeping much better with the device.   Review of Systems  Constitutional: Negative for fever and unexpected weight change.  HENT: Negative for congestion, dental problem, ear pain, nosebleeds, postnasal drip, rhinorrhea, sinus pressure, sneezing, sore throat and trouble swallowing.   Eyes: Negative for redness and itching.  Respiratory: Negative for cough, chest tightness, shortness of breath and wheezing.   Cardiovascular: Negative for palpitations and leg swelling.  Gastrointestinal: Negative for nausea and vomiting.  Genitourinary: Negative for dysuria.  Musculoskeletal: Negative for joint swelling.  Skin: Negative for rash.  Neurological: Negative for headaches.  Hematological: Does not bruise/bleed easily.  Psychiatric/Behavioral: Negative for dysphoric mood. The patient is not nervous/anxious.        Objective:   Physical Exam Morbidly obese male in no acute distress Nose without purulence or discharge noted No skin breakdown or pressure necrosis from the CPAP mask Neck without lymphadenopathy or thyromegaly Lower extremities with edema noted, no cyanosis Alert and oriented, moves all 4 extremities.       Assessment & Plan:

## 2013-07-20 ENCOUNTER — Telehealth: Payer: Self-pay | Admitting: *Deleted

## 2013-07-20 NOTE — Telephone Encounter (Signed)
[employer] faxed a note requesting an order be completed for diabetic supplies. I called the pt to ask if he wanted to receive supplies from this company and he stated he was not aware of this as he usually receives these from [facility] and always goes to pick these up.  I called [facility] at [phone] and spoke to [place] and she stated this company is NOT a part of [facility] and not one of their sister companies which would be [employer] [facility] [facility].  I called [employer] at [phone] and advised Jenny Reichmann the pt will not be receiving his supplies from this company.

## 2013-07-22 ENCOUNTER — Ambulatory Visit (INDEPENDENT_AMBULATORY_CARE_PROVIDER_SITE_OTHER): Payer: BC Managed Care – PPO | Admitting: Family Medicine

## 2013-07-22 ENCOUNTER — Encounter: Payer: Self-pay | Admitting: Family Medicine

## 2013-07-22 VITALS — BP 138/84 | HR 119 | Ht 74.0 in | Wt 344.5 lb

## 2013-07-22 DIAGNOSIS — G4733 Obstructive sleep apnea (adult) (pediatric): Secondary | ICD-10-CM

## 2013-07-22 DIAGNOSIS — L989 Disorder of the skin and subcutaneous tissue, unspecified: Secondary | ICD-10-CM

## 2013-07-22 DIAGNOSIS — E1165 Type 2 diabetes mellitus with hyperglycemia: Principal | ICD-10-CM

## 2013-07-22 DIAGNOSIS — E1149 Type 2 diabetes mellitus with other diabetic neurological complication: Secondary | ICD-10-CM

## 2013-07-22 DIAGNOSIS — I4891 Unspecified atrial fibrillation: Secondary | ICD-10-CM

## 2013-07-22 DIAGNOSIS — IMO0002 Reserved for concepts with insufficient information to code with codable children: Secondary | ICD-10-CM

## 2013-07-22 LAB — BASIC METABOLIC PANEL
BUN: 11 mg/dL (ref 6–23)
CO2: 27 meq/L (ref 19–32)
Calcium: 9.3 mg/dL (ref 8.4–10.5)
Chloride: 102 mEq/L (ref 96–112)
Creatinine, Ser: 0.9 mg/dL (ref 0.4–1.5)
GFR: 89.34 mL/min (ref >60.00)
Glucose, Bld: 146 mg/dL — ABNORMAL HIGH (ref 70–99)
Potassium: 3.8 mEq/L (ref 3.5–5.1)
SODIUM: 138 meq/L (ref 135–145)

## 2013-07-22 LAB — MICROALBUMIN / CREATININE URINE RATIO
CREATININE, U: 185.1 mg/dL
Microalb Creat Ratio: 3.9 mg/g (ref 0.0–30.0)
Microalb, Ur: 7.3 mg/dL — ABNORMAL HIGH (ref 0.0–1.9)

## 2013-07-22 LAB — HEMOGLOBIN A1C: HEMOGLOBIN A1C: 6.9 % — AB (ref 4.6–6.5)

## 2013-07-22 NOTE — Progress Notes (Signed)
Pre visit review using our clinic review tool, if applicable. No additional management support is needed unless otherwise documented below in the visit note. 

## 2013-07-22 NOTE — Progress Notes (Signed)
No chief complaint on file.   HPI:  Acute visit for:  1) DM - uncontrolled with neurological manifestations: -home BS: not checking - not given monitor or taught how to check sugar at diabetes educator visit -meds: asa, metformin 1000 mg bid -denies: polyuria, polydipsia, wounds -seeing podiatry -last eye exam: not done  2) Morbid obesity: -advised lifestyle changes and nutritionist  3)A. Fib, OSA, CHF: -followed by cardiology and pulmonolgy  HM: -needs tdap  ROS: See pertinent positives and negatives per HPI.  Past Medical History  Diagnosis Date  . Morbid obesity   . Atrial fibrillation     unable to tolerate versed and fentanyl sedation and required gen anesthesia for TEE;  s/p TEE-DCCV (failed);  Amiodarone started  . Chronic diastolic heart failure     a. Echo 03/27/11: EF 50-55%, inferior hypokinesis, moderate LAE, mild RVE, normal pulmonary pressures. ;   b.  TEE (04/03/11): EF 40-45%, mild MR moderate LAE, no LAA clot, mild RAE;  c.  Echocardiogram (02/2013): EF 60-65%, Gr 2 DD, mildly dilated Ao root (Ao root dimension 39 mm), MAC, mild LAE, normal RVF  . Sleep apnea     a. sleep test 01/2013:  very severy OSA  . Diabetes mellitus, type 2   . Hx of cardiovascular stress test     Lexiscan Myoview (02/2013): No ischemia or scar, EF 51%, low risk    Past Surgical History  Procedure Laterality Date  . Tee without cardioversion  04/01/2011    Procedure: TRANSESOPHAGEAL ECHOCARDIOGRAM (TEE);  Surgeon: Lelon Perla, MD;  Location: [facility] ENDOSCOPY;  Service: Cardiovascular;  Laterality: N/A;  . Cardioversion  04/01/2011    Procedure: CARDIOVERSION;  Surgeon: Lelon Perla, MD;  Location: [facility] ENDOSCOPY;  Service: Cardiovascular;  Laterality: N/A;  . Tee without cardioversion  04/03/2011    Procedure: TRANSESOPHAGEAL ECHOCARDIOGRAM (TEE);  Surgeon: Lelon Perla, MD;  Location: [facility] ENDOSCOPY;  Service: Cardiovascular;  Laterality: N/A;  . Cardioversion  04/03/2011   Procedure: CARDIOVERSION;  Surgeon: Lelon Perla, MD;  Location: [facility] ENDOSCOPY;  Service: Cardiovascular;  Laterality: N/A;  . Tee without cardioversion  04/03/2011    Procedure: TRANSESOPHAGEAL ECHOCARDIOGRAM (TEE);  Surgeon: Lelon Perla, MD;  Location: [place];  Service: Cardiovascular;  Laterality: N/A;  . Cardioversion  04/03/2011    Procedure: CARDIOVERSION;  Surgeon: Lelon Perla, MD;  Location: [facility] OR;  Service: Cardiovascular;  Laterality: N/A;    Family History  Problem Relation Age of Onset  . Diabetes Father     died in his 46s  . Hypertension Father     died in her 1s  . Seizures Mother   . Hypertension Sister     History   Social History  . Marital Status: Single    Spouse Name: N/A    Number of Children: N/A  . Years of Education: N/A   Occupational History  . unemployeed    Social History Main Topics  . Smoking status: Never Smoker   . Smokeless tobacco: None  . Alcohol Use: Yes     Comment: some  . Drug Use: No  . Sexual Activity: No   Other Topics Concern  . None   Social History Narrative   Admits that he eats poorly and eats a lot at work.      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    Current outpatient prescriptions:aspirin EC 81 MG tablet, Take 81-243 mg by mouth as needed., Disp: , Rfl: ;  diltiazem (CARDIZEM CD) 120 MG 24 hr capsule, Take 1 capsule (120 mg total) by mouth daily., Disp: 30 capsule, Rfl: 11;  furosemide (LASIX) 40 MG tablet, Take 0.5 tablets (20 mg total) by mouth daily., Disp: 30 tablet, Rfl: 11 metFORMIN (GLUCOPHAGE) 1000 MG tablet, Take 1 tablet (1,000 mg total) by mouth 2 (two) times daily with a meal., Disp: 60 tablet, Rfl: 3  EXAM:  Filed Vitals:   07/22/13 1007  BP: 138/84  Pulse: 119    Body mass index is 44.21 kg/(m^2).  GENERAL: vitals reviewed and listed above, alert, oriented, appears well hydrated and in no acute distress  HEENT: atraumatic, conjunttiva clear, no obvious  abnormalities on inspection of external nose and ears  NECK: no obvious masses on inspection  LUNGS: clear to auscultation bilaterally, no wheezes, rales or rhonchi, good air movement  CV: HRRR, no peripheral edema  MS: moves all extremities without noticeable abnormality  PSYCH: pleasant and cooperative, no obvious depression or anxiety  ASSESSMENT AND PLAN:  Discussed the following assessment and plan:  Diabetes mellitus with neurological manifestations, uncontrolled - Plan: Basic metabolic panel, Hemoglobin A1c, Microalbumin/Creatinine Ratio, Urine, Ambulatory referral to diabetic education  Obesity, Class III, BMI 40-49.9 (morbid obesity)  Atrial Fibrillation  OSA (obstructive sleep apnea)  Skin lesion  Skin Lesion: -wart vs SK on scalp - advised he see dermatologist for removal  ->45 minutes spent face to face in counseling this patient - again - it is very difficult to have a conversation with him as he insists on talking non-stop -discussed options if [id number] not at goal - and may do januvia (wt neutral), victoza or invokana after discussion risks/benefits  -advised healthy diet and exercise  -labs today -follow up 3-4 months -Patient advised to return or notify a doctor immediately if symptoms worsen or persist or new concerns arise.  Patient Instructions  FOR YOUR DIABETES:  []  Eat a healthy low carb diet (avoid sweets, sweet drinks, breads, potatoes, rice, etc.) and ensure 3 small meals daily.  []  Get AT LEAST 150 minutes of cardiovascular exercise per week - 30 minutes per day is best of sustained sweaty exercise.  []  Take all of your medications every day as directed by your doctor. Call your doctor immediately if you have any questions about your medications or are running low.  []  Check your blood sugar often and when you feel unwell and keep a log to bring to all health appointments. FASTING: before you eat anything in the morning POSTPRANDIAL: 1-2 hours  after a meal  []  If any low blood sugars < 70, eat a snack and call your doctor immediately.  []  See an eye doctor every year and fax your diabetic eye exam to our office.  Fax: [phone]  []  Take good care of your feet and keep them soft and callus free. Check your feet daily and wear comfortable shoes. See your doctor immediately if you have any cuts, calluses or wounds on your feet.  See your dermatologist for removal of the skin lesions on your head      [name].

## 2013-07-22 NOTE — Patient Instructions (Signed)
FOR YOUR DIABETES:  []  Eat a healthy low carb diet (avoid sweets, sweet drinks, breads, potatoes, rice, etc.) and ensure 3 small meals daily.  []  Get AT LEAST 150 minutes of cardiovascular exercise per week - 30 minutes per day is best of sustained sweaty exercise.  []  Take all of your medications every day as directed by your doctor. Call your doctor immediately if you have any questions about your medications or are running low.  []  Check your blood sugar often and when you feel unwell and keep a log to bring to all health appointments. FASTING: before you eat anything in the morning POSTPRANDIAL: 1-2 hours after a meal  []  If any low blood sugars < 70, eat a snack and call your doctor immediately.  []  See an eye doctor every year and fax your diabetic eye exam to our office.  Fax: [phone]  []  Take good care of your feet and keep them soft and callus free. Check your feet daily and wear comfortable shoes. See your doctor immediately if you have any cuts, calluses or wounds on your feet.  See your dermatologist for removal of the skin lesions on your head

## 2013-07-25 ENCOUNTER — Encounter: Payer: Self-pay | Admitting: *Deleted

## 2013-09-02 ENCOUNTER — Telehealth: Payer: Self-pay | Admitting: Family Medicine

## 2013-09-02 MED ORDER — GLUCOSE BLOOD VI STRP: ORAL_STRIP | Status: DC

## 2013-09-02 MED ORDER — BAYER CONTOUR MONITOR W/DEVICE KIT: 1.0000 | PACK | Freq: Once | Status: DC

## 2013-09-02 NOTE — Telephone Encounter (Signed)
I called [facility] and the order for the [place] and strips were given to Shanon Brow the pharmacist.

## 2013-09-02 NOTE — Telephone Encounter (Signed)
Pharm  Needs verbal order for  bayer contour test strips and meter. Ref # [id number]

## 2013-09-02 NOTE — Telephone Encounter (Signed)
Norma-do you recall who you talked with about this? I called [facility] and the pharmacist stated she did not call us regarding this for the pt.

## 2013-09-02 NOTE — Telephone Encounter (Signed)
Arville Go sorry info in contacts [facility] [phone]

## 2013-09-19 ENCOUNTER — Other Ambulatory Visit: Payer: Self-pay | Admitting: Family Medicine

## 2013-09-27 ENCOUNTER — Other Ambulatory Visit: Payer: Self-pay | Admitting: Family Medicine

## 2013-11-16 ENCOUNTER — Encounter: Payer: Self-pay | Admitting: Pulmonary Disease

## 2013-11-16 ENCOUNTER — Ambulatory Visit (INDEPENDENT_AMBULATORY_CARE_PROVIDER_SITE_OTHER): Payer: BC Managed Care – PPO | Admitting: Pulmonary Disease

## 2013-11-16 VITALS — BP 138/74 | HR 88 | Temp 98.1°F | Ht 74.0 in | Wt 346.2 lb

## 2013-11-16 DIAGNOSIS — G4733 Obstructive sleep apnea (adult) (pediatric): Secondary | ICD-10-CM

## 2013-11-16 NOTE — Progress Notes (Signed)
   Subjective:    Patient ID: Darius [name], male    DOB: Feb 02, 1956, 58 y.o.   MRN: [id number]  HPI Patient comes in today for followup of his very severe obstructive sleep apnea. He is wearing CPAP compliantly by his download, with adequate control of his AHI since his pressure has been increased, and no significant mask leaks.  Patient feels that he is doing better on the device, and has improved daytime alertness.   Review of Systems  Constitutional: Negative for fever and unexpected weight change.  HENT: Negative for congestion, dental problem, ear pain, nosebleeds, postnasal drip, rhinorrhea, sinus pressure, sneezing, sore throat and trouble swallowing.   Eyes: Negative for redness and itching.  Respiratory: Negative for cough, chest tightness, shortness of breath and wheezing.   Cardiovascular: Negative for palpitations and leg swelling.  Gastrointestinal: Negative for nausea and vomiting.  Genitourinary: Negative for dysuria.  Musculoskeletal: Negative for joint swelling.  Skin: Negative for rash.  Neurological: Negative for headaches.  Hematological: Does not bruise/bleed easily.  Psychiatric/Behavioral: Negative for dysphoric mood. The patient is not nervous/anxious.        Objective:   Physical Exam Morbidly obese male in no acute distress Nose without purulence or discharge noted Neck without lymphadenopathy or thyromegaly No skin breakdown or pressure necrosis from the CPAP mask Lower extremities without edema, no cyanosis Alert and oriented, moves all 4 extremities.       Assessment & Plan:

## 2013-11-16 NOTE — Assessment & Plan Note (Signed)
The patient appears to be doing very well on his CPAP device by his download, and he feels that his symptoms are improved as well. He is requiring fairly high pressures in order to control his sleep apnea, and therefore I stressed to him the importance of aggressive weight loss. I have also encouraged him to keep up with his mask changes and supplies.

## 2013-11-16 NOTE — Patient Instructions (Signed)
Continue on cpap.  Your download looks great. Work on weight loss and keep up with mask changes and supplies. followup with me again in one year.

## 2014-02-22 ENCOUNTER — Other Ambulatory Visit: Payer: Self-pay | Admitting: Physician Assistant

## 2014-04-02 ENCOUNTER — Emergency Department (HOSPITAL_COMMUNITY): Payer: BLUE CROSS/BLUE SHIELD

## 2014-04-02 ENCOUNTER — Ambulatory Visit: Payer: Self-pay

## 2014-04-02 ENCOUNTER — Encounter (HOSPITAL_COMMUNITY): Payer: Self-pay | Admitting: Emergency Medicine

## 2014-04-02 ENCOUNTER — Inpatient Hospital Stay (HOSPITAL_COMMUNITY)
Admission: EM | Admit: 2014-04-02 | Discharge: 2014-04-09 | DRG: 292 | Disposition: A | Discharge status: Home Health | Payer: BLUE CROSS/BLUE SHIELD | Attending: Cardiology | Admitting: Cardiology

## 2014-04-02 ENCOUNTER — Ambulatory Visit (INDEPENDENT_AMBULATORY_CARE_PROVIDER_SITE_OTHER): Payer: Self-pay | Admitting: Emergency Medicine

## 2014-04-02 VITALS — BP 110/78 | HR 123 | Temp 97.4°F | Resp 24 | Ht 72.0 in | Wt 355.0 lb

## 2014-04-02 DIAGNOSIS — I5041 Acute combined systolic (congestive) and diastolic (congestive) heart failure: Secondary | ICD-10-CM | POA: Diagnosis not present

## 2014-04-02 DIAGNOSIS — R9389 Abnormal findings on diagnostic imaging of other specified body structures: Secondary | ICD-10-CM | POA: Diagnosis present

## 2014-04-02 DIAGNOSIS — E66813 Obesity, class 3: Secondary | ICD-10-CM | POA: Diagnosis present

## 2014-04-02 DIAGNOSIS — Z6841 Body Mass Index (BMI) 40.0 and over, adult: Secondary | ICD-10-CM

## 2014-04-02 DIAGNOSIS — I5032 Chronic diastolic (congestive) heart failure: Secondary | ICD-10-CM | POA: Diagnosis present

## 2014-04-02 DIAGNOSIS — R0602 Shortness of breath: Secondary | ICD-10-CM

## 2014-04-02 DIAGNOSIS — E1165 Type 2 diabetes mellitus with hyperglycemia: Principal | ICD-10-CM

## 2014-04-02 DIAGNOSIS — R911 Solitary pulmonary nodule: Secondary | ICD-10-CM | POA: Diagnosis present

## 2014-04-02 DIAGNOSIS — Z9119 Patient's noncompliance with other medical treatment and regimen: Secondary | ICD-10-CM | POA: Diagnosis present

## 2014-04-02 DIAGNOSIS — G4733 Obstructive sleep apnea (adult) (pediatric): Secondary | ICD-10-CM | POA: Diagnosis present

## 2014-04-02 DIAGNOSIS — E871 Hypo-osmolality and hyponatremia: Secondary | ICD-10-CM | POA: Diagnosis not present

## 2014-04-02 DIAGNOSIS — I4891 Unspecified atrial fibrillation: Secondary | ICD-10-CM

## 2014-04-02 DIAGNOSIS — I509 Heart failure, unspecified: Secondary | ICD-10-CM

## 2014-04-02 DIAGNOSIS — E1142 Type 2 diabetes mellitus with diabetic polyneuropathy: Secondary | ICD-10-CM

## 2014-04-02 DIAGNOSIS — Z91199 Patient's noncompliance with other medical treatment and regimen due to unspecified reason: Secondary | ICD-10-CM

## 2014-04-02 DIAGNOSIS — R059 Cough, unspecified: Secondary | ICD-10-CM

## 2014-04-02 DIAGNOSIS — Z9114 Patient's other noncompliance with medication regimen: Secondary | ICD-10-CM | POA: Diagnosis present

## 2014-04-02 DIAGNOSIS — I4819 Other persistent atrial fibrillation: Secondary | ICD-10-CM | POA: Diagnosis present

## 2014-04-02 DIAGNOSIS — Z7982 Long term (current) use of aspirin: Secondary | ICD-10-CM

## 2014-04-02 DIAGNOSIS — R05 Cough: Secondary | ICD-10-CM

## 2014-04-02 DIAGNOSIS — E876 Hypokalemia: Secondary | ICD-10-CM | POA: Diagnosis present

## 2014-04-02 DIAGNOSIS — I5031 Acute diastolic (congestive) heart failure: Secondary | ICD-10-CM

## 2014-04-02 DIAGNOSIS — Z7901 Long term (current) use of anticoagulants: Secondary | ICD-10-CM

## 2014-04-02 DIAGNOSIS — IMO0002 Reserved for concepts with insufficient information to code with codable children: Secondary | ICD-10-CM

## 2014-04-02 DIAGNOSIS — I5043 Acute on chronic combined systolic (congestive) and diastolic (congestive) heart failure: Secondary | ICD-10-CM | POA: Diagnosis present

## 2014-04-02 DIAGNOSIS — I5042 Chronic combined systolic (congestive) and diastolic (congestive) heart failure: Secondary | ICD-10-CM | POA: Diagnosis present

## 2014-04-02 DIAGNOSIS — I480 Paroxysmal atrial fibrillation: Secondary | ICD-10-CM | POA: Diagnosis present

## 2014-04-02 DIAGNOSIS — E119 Type 2 diabetes mellitus without complications: Secondary | ICD-10-CM

## 2014-04-02 LAB — BASIC METABOLIC PANEL
ANION GAP: 11 (ref 5–15)
BUN: 17 mg/dL (ref 6–23)
CO2: 25 mmol/L (ref 19–32)
Calcium: 8 mg/dL — ABNORMAL LOW (ref 8.4–10.5)
Chloride: 93 mmol/L — ABNORMAL LOW (ref 96–112)
Creatinine, Ser: 0.93 mg/dL (ref 0.50–1.35)
GFR calc non Af Amer: 89 mL/min — ABNORMAL LOW (ref >90)
GLUCOSE: 104 mg/dL — AB (ref 70–99)
Potassium: 3.7 mmol/L (ref 3.5–5.1)
SODIUM: 129 mmol/L — AB (ref 135–145)

## 2014-04-02 LAB — CBC
HEMATOCRIT: 45.1 % (ref 39.0–52.0)
Hemoglobin: 15.2 g/dL (ref 13.0–17.0)
MCH: 27.8 pg (ref 26.0–34.0)
MCHC: 33.7 g/dL (ref 30.0–36.0)
MCV: 82.4 fL (ref 78.0–100.0)
Platelets: 179 10*3/uL (ref 150–400)
RBC: 5.47 MIL/uL (ref 4.22–5.81)
RDW: 13.8 % (ref 11.5–15.5)
WBC: 4.4 10*3/uL (ref 4.0–10.5)

## 2014-04-02 LAB — POCT GLYCOSYLATED HEMOGLOBIN (HGB A1C): Hemoglobin A1C: 6.6

## 2014-04-02 LAB — GLUCOSE, POCT (MANUAL RESULT ENTRY): POC Glucose: 144 mg/dl — AB (ref 70–99)

## 2014-04-02 LAB — I-STAT TROPONIN, ED: Troponin i, poc: 0.05 ng/mL (ref 0.00–0.08)

## 2014-04-02 LAB — BRAIN NATRIURETIC PEPTIDE: B Natriuretic Peptide: 144.3 pg/mL — ABNORMAL HIGH (ref 0.0–100.0)

## 2014-04-02 LAB — CBG MONITORING, ED: GLUCOSE-CAPILLARY: 117 mg/dL — AB (ref 70–99)

## 2014-04-02 NOTE — ED Notes (Signed)
C/o sob with exertion and non-productive cough that has increased over the past 2 weeks.   Denies chest pain.  Pt states he has CHF and uncontrolled diabetes.  Reports eating a whole box of Valentine's Day candy on Monday.  States he has the supplies to check blood sugar at home but has misplaced it.

## 2014-04-02 NOTE — Progress Notes (Signed)
[facility] [address], [place] [address] [phone]  Date:  04/02/2014   Name:  Damaris Geers   DOB:  [birth year]   MRN:  [id number]  PCP:  [name]., DO    Chief Complaint: Cough; Fatigue; and Shortness of Breath   History of Present Illness:  Jarry Manon is a 59 y.o. very pleasant male patient who presents with the following:  Has CHF, OSA, AF, NIDDM, and mobid obesity. Has increasing edema, cough and shortness of breath Decreased exercise tolerance. No chest pain, nausea or vomiting Has increased fatigue. Ate an entire box of valentine candy last week in one setting with a bottle of milk  Not compliant with medications; missed several days of diabetes meds and has taken no lasix today as he was coming here. Blames his situation on his diabetes.    Patient Active Problem List   Diagnosis Date Noted  . OSA (obstructive sleep apnea) 03/22/2013  . Atrial Fibrillation 03/27/2011  . Obesity, Class III, BMI 40-49.9 (morbid obesity) 03/27/2011  . URI, acute 03/27/2011    Past Medical History  Diagnosis Date  . Morbid obesity   . Atrial fibrillation     unable to tolerate versed and fentanyl sedation and required gen anesthesia for TEE;  s/p TEE-DCCV (failed);  Amiodarone started  . Chronic diastolic heart failure     a. Echo 03/27/11: EF 50-55%, inferior hypokinesis, moderate LAE, mild RVE, normal pulmonary pressures. ;   b.  TEE (04/03/11): EF 40-45%, mild MR moderate LAE, no LAA clot, mild RAE;  c.  Echocardiogram (02/2013): EF 60-65%, Gr 2 DD, mildly dilated Ao root (Ao root dimension 39 mm), MAC, mild LAE, normal RVF  . Sleep apnea     a. sleep test 01/2013:  very severy OSA  . Diabetes mellitus, type 2   . Hx of cardiovascular stress test     Lexiscan Myoview (02/2013): No ischemia or scar, EF 51%, low risk    Past Surgical History  Procedure Laterality Date  . Tee without cardioversion  04/01/2011    Procedure: TRANSESOPHAGEAL  ECHOCARDIOGRAM (TEE);  Surgeon: Lelon Perla, MD;  Location: [facility] ENDOSCOPY;  Service: Cardiovascular;  Laterality: N/A;  . Cardioversion  04/01/2011    Procedure: CARDIOVERSION;  Surgeon: Lelon Perla, MD;  Location: [facility] ENDOSCOPY;  Service: Cardiovascular;  Laterality: N/A;  . Tee without cardioversion  04/03/2011    Procedure: TRANSESOPHAGEAL ECHOCARDIOGRAM (TEE);  Surgeon: Lelon Perla, MD;  Location: [facility] ENDOSCOPY;  Service: Cardiovascular;  Laterality: N/A;  . Cardioversion  04/03/2011    Procedure: CARDIOVERSION;  Surgeon: Lelon Perla, MD;  Location: [facility] ENDOSCOPY;  Service: Cardiovascular;  Laterality: N/A;  . Tee without cardioversion  04/03/2011    Procedure: TRANSESOPHAGEAL ECHOCARDIOGRAM (TEE);  Surgeon: Lelon Perla, MD;  Location: [place];  Service: Cardiovascular;  Laterality: N/A;  . Cardioversion  04/03/2011    Procedure: CARDIOVERSION;  Surgeon: Lelon Perla, MD;  Location: [facility] OR;  Service: Cardiovascular;  Laterality: N/A;    History  Substance Use Topics  . Smoking status: Never Smoker   . Smokeless tobacco: Not on file  . Alcohol Use: Yes     Comment: some    Family History  Problem Relation Age of Onset  . Diabetes Father     died in his 37s  . Hypertension Father     died in her 4s  . Seizures Mother   . Hypertension Sister  No Known Allergies  Medication list has been reviewed and updated.  Current Outpatient Prescriptions on File Prior to Visit  Medication Sig Dispense Refill  . aspirin EC 81 MG tablet Take 81 mg by mouth daily.     . Blood Glucose Monitoring Suppl (BAYER CONTOUR MONITOR) W/DEVICE KIT 1 each by Does not apply route once. Use twice a day to check blood sugar 1 kit 0  . CARTIA XT 120 MG 24 hr capsule take 1 capsule by mouth once daily 30 capsule 1  . furosemide (LASIX) 40 MG tablet take 1/2 tablet by mouth once daily 15 tablet 1  . glucose blood (BAYER CONTOUR TEST) test strip Use as instructed to check blood sugar  twice a day 100 each 3  . metFORMIN (GLUCOPHAGE) 1000 MG tablet take 1 tablet by mouth twice a day with food 60 tablet 3   No current facility-administered medications on file prior to visit.    Review of Systems:  As per HPI, otherwise negative.    Physical Examination: Filed Vitals:   04/02/14 1551  BP: 110/78  Pulse: 123  Temp: 97.4 F (36.3 C)  Resp: 24   Filed Vitals:   04/02/14 1551  Height: 6' (1.829 m)  Weight: 355 lb (161.027 kg)   Body mass index is 48.14 kg/(m^2). Ideal Body Weight: Weight in (lb) to have BMI = 25: 183.9  GEN: morbidly obese, NAD, Non-toxic, A & O x 3 HEENT: Atraumatic, Normocephalic. Neck supple. No masses, No LAD. Ears and Nose: No external deformity. CV: RRR, No M/G/R. No JVD. No thrill. No extra heart sounds. PULM: , no wheezes, crackles, generalized rhonchi. No retractions. No resp. distress. No accessory muscle use. ABD: S, NT, ND, +BS. No rebound. No HSM. EXTR: No c/c.  Marked edema to knees bilaterally NEURO Normal gait.  PSYCH: Normally interactive. Conversant. Not depressed or anxious appearing.  Calm demeanor.    Assessment and Plan: Non compliance NIDDM CHF Morbid obesity Acute respiratory distress Atrial fibrillation To ER via EMS  Refused transport after EMS arrival. [place] home to feed his cats and gather "things" will go to ER by POV after over our objection  Signed,  Ellison Carwin, MD   EKG shows he has reverted back to A Fib

## 2014-04-02 NOTE — ED Provider Notes (Signed)
CSN: [id number]     Arrival date & time 04/02/14  2136 History   First MD Initiated Contact with Patient 04/02/14 2220     Chief Complaint  Patient presents with  . Shortness of Breath   (Consider location/radiation/quality/duration/timing/severity/associated sxs/prior Treatment) HPI Comments: 59 yo M hx of atrial fibrillation s/p cardioversion with conversionto NSR 4801, chronic diastolic HF (EF 65-53%, Jan, 2015), OSA, DMII, morbid obesity, presents with CC of SOB.  Pt states he has felt ill for the last week.  C/o exertional dyspnea, only able to walk 5 feet without becoming extremely winded.  Associate nonproductive cough, and bilateral leg swelling.  Denies fever, chills, CP or discomfort, palpitations, abdominal pain, nausea, vomiting, diarrhea, rash, myalgias, or any other symptoms.  Pt states he is supposed to take Lasix, however he has not taken this medication in the last 2 days 2/2 to feeling poorly.  Denies any other symptoms.   The history is provided by the patient. No language interpreter was used.    Past Medical History  Diagnosis Date  . Morbid obesity   . Atrial fibrillation     unable to tolerate versed and fentanyl sedation and required gen anesthesia for TEE;  s/p TEE-DCCV (failed);  Amiodarone started  . Chronic diastolic heart failure     a. Echo 03/27/11: EF 50-55%, inferior hypokinesis, moderate LAE, mild RVE, normal pulmonary pressures. ;   b.  TEE (04/03/11): EF 40-45%, mild MR moderate LAE, no LAA clot, mild RAE;  c.  Echocardiogram (02/2013): EF 60-65%, Gr 2 DD, mildly dilated Ao root (Ao root dimension 39 mm), MAC, mild LAE, normal RVF  . Sleep apnea     a. sleep test 01/2013:  very severy OSA  . Diabetes mellitus, type 2   . Hx of cardiovascular stress test     Lexiscan Myoview (02/2013): No ischemia or scar, EF 51%, low risk   Past Surgical History  Procedure Laterality Date  . Tee without cardioversion  04/01/2011    Procedure: TRANSESOPHAGEAL ECHOCARDIOGRAM  (TEE);  Surgeon: Lelon Perla, MD;  Location: [facility] ENDOSCOPY;  Service: Cardiovascular;  Laterality: N/A;  . Cardioversion  04/01/2011    Procedure: CARDIOVERSION;  Surgeon: Lelon Perla, MD;  Location: [facility] ENDOSCOPY;  Service: Cardiovascular;  Laterality: N/A;  . Tee without cardioversion  04/03/2011    Procedure: TRANSESOPHAGEAL ECHOCARDIOGRAM (TEE);  Surgeon: Lelon Perla, MD;  Location: [facility] ENDOSCOPY;  Service: Cardiovascular;  Laterality: N/A;  . Cardioversion  04/03/2011    Procedure: CARDIOVERSION;  Surgeon: Lelon Perla, MD;  Location: [facility] ENDOSCOPY;  Service: Cardiovascular;  Laterality: N/A;  . Tee without cardioversion  04/03/2011    Procedure: TRANSESOPHAGEAL ECHOCARDIOGRAM (TEE);  Surgeon: Lelon Perla, MD;  Location: [facility] [place];  Service: Cardiovascular;  Laterality: N/A;  . Cardioversion  04/03/2011    Procedure: CARDIOVERSION;  Surgeon: Lelon Perla, MD;  Location: [facility] OR;  Service: Cardiovascular;  Laterality: N/A;   Family History  Problem Relation Age of Onset  . Diabetes Father     died in his 27s  . Hypertension Father     died in her 68s  . Seizures Mother   . Hypertension Sister    History  Substance Use Topics  . Smoking status: Never Smoker   . Smokeless tobacco: Not on file  . Alcohol Use: Yes     Comment: some    Review of Systems  Constitutional: Negative for fever and chills.  Respiratory: Positive for cough and shortness of  breath.   Cardiovascular: Positive for leg swelling. Negative for chest pain and palpitations.  Gastrointestinal: Negative for nausea, vomiting and abdominal pain.  Genitourinary: Negative for dysuria.  Musculoskeletal: Negative for myalgias.  Skin: Negative for rash.  Neurological: Negative for dizziness, weakness, light-headedness, numbness and headaches.  Hematological: Negative for adenopathy. Does not bruise/bleed easily.  All other systems reviewed and are negative.     Allergies  Review of patient's  allergies indicates no known allergies.  Home Medications   Prior to Admission medications   Medication Sig Start Date End Date Taking? Authorizing Provider  aspirin EC 81 MG tablet Take 81 mg by mouth daily.  01/19/13   Liliane Shi, PA-C  Blood Glucose Monitoring Suppl (BAYER CONTOUR MONITOR) W/DEVICE KIT 1 each by Does not apply route once. Use twice a day to check blood sugar 09/02/13   [name], DO  CARTIA XT 120 MG 24 hr capsule take 1 capsule by mouth once daily 02/23/14   [name], MD  furosemide (LASIX) 40 MG tablet take 1/2 tablet by mouth once daily 02/23/14   [name], MD  glucose blood (BAYER CONTOUR TEST) test strip Use as instructed to check blood sugar twice a day 09/02/13   [name], DO  metFORMIN (GLUCOPHAGE) 1000 MG tablet take 1 tablet by mouth twice a day with food    [name], DO   BP 140/86 mmHg  Pulse 70  Temp(Src) 98.7 F (37.1 C) (Oral)  Resp 24  Ht '6\' 2"'  (1.88 m)  Wt 350 lb (158.759 kg)  BMI 44.92 kg/m2  SpO2 92% Physical Exam  Constitutional: He is oriented to person, place, and time. He appears well-developed and well-nourished.  HENT:  Head: Normocephalic and atraumatic.  Right Ear: External ear normal.  Left Ear: External ear normal.  Mouth/Throat: Oropharynx is clear and moist.  Eyes: Conjunctivae and EOM are normal. Pupils are equal, round, and reactive to light.  Neck: Normal range of motion. Neck supple.  Cardiovascular: Normal rate, regular rhythm, normal heart sounds and intact distal pulses.   Pulmonary/Chest: Effort normal and breath sounds normal. No respiratory distress. He has no wheezes. He has no rales. He exhibits no tenderness.  Course breath sounds bilaterally.  Faint wheeze, which clears with cough.   Abdominal: Soft. Bowel sounds are normal. He exhibits no distension and no mass. There is no tenderness. There is no rebound and no guarding.  Musculoskeletal: Normal range of motion. He exhibits edema.  2+  pitting edema bilaterally.   Neurological: He is alert and oriented to person, place, and time.  Skin: Skin is warm and dry.  Vitals reviewed.   ED Course  Procedures (including critical care time) Labs Review Labs Reviewed  BASIC METABOLIC PANEL - Abnormal; Notable for the following:    Sodium 129 (*)    Chloride 93 (*)    Glucose, Bld 104 (*)    Calcium 8.0 (*)    GFR calc non Af Amer 89 (*)    All other components within normal limits  BRAIN NATRIURETIC PEPTIDE - Abnormal; Notable for the following:    B Natriuretic Peptide 144.3 (*)    All other components within normal limits  D-DIMER, QUANTITATIVE - Abnormal; Notable for the following:    D-Dimer, Quant 0.89 (*)    All other components within normal limits  CBG MONITORING, ED - Abnormal; Notable for the following:    Glucose-Capillary 117 (*)    All other  components within normal limits  CBC  I-STAT TROPOININ, ED   Imaging Review Dg Chest 2 View  04/02/2014   CLINICAL DATA:  Shortness of breath for 3 days. History of diabetes, hypertension, heart disease.  EXAM: CHEST  2 VIEW  COMPARISON:  03/30/2011.  FINDINGS: Cardiac enlargement. Mild vascular congestion. No edema or consolidation. No blunting of costophrenic angles. No pneumothorax. Tortuous aorta. Degenerative changes in the spine.  IMPRESSION: Cardiac enlargement with mild pulmonary vascular congestion. No edema or consolidation.   Electronically Signed   By: Lucienne Capers M.D.   On: 04/02/2014 23:02    EKG Interpretation   Date/Time:  Sunday April 02 2014 21:43:27 EST Ventricular Rate:  116 PR Interval:    QRS Duration: 88 QT Interval:  338 QTC Calculation: 469 R Axis:   100 Text Interpretation:  Atrial fibrillation with rapid ventricular response  with premature ventricular or aberrantly conducted complexes Rightward  axis Septal infarct , age undetermined last tracing showed  Normal sinus  rhythm Confirmed by Winfred Leeds  MD, SAM [phone]) on 04/02/2014  11:07:24 PM      MDM   Final diagnoses:  Acute exacerbation of CHF (congestive heart failure)  Atrial fibrillation with RVR  Hyponatremia   59 yo M hx of atrial fibrillation s/p cardioversion with conversionto NSR 1982, chronic diastolic HF (EF 42-99%, Jan, 2015), OSA, DMII, morbid obesity, presents with CC of SOB.  Physical exam as above.  Pt afebrile, rate 100-120 with atrial fibrillation, BP 107/81, satting 93% on RA.  CXR with course breath sounds, no rales.  Pt with BLE edema slightly worse than baseline, and has not taken Lasix in last two days.    DDx atypical ACS, CHF exacerbation, PE.  Unlikely PNA 2/2 afebrile, normal WBC count.   Labs demonstrate CBC WNL, BMP with hyponatremia 129 (likely 2/2 to fluid overload), normal renal function, BNP mildly elevated 144, troponin 0.05.   CXR with cardiac enlargement with mild pulmonary vascular congestion.  No edema or consolidation.   Will obtain D-dimer to assess for possible PE.  Will reeval.    D-dimer elevated.  CT PE study demonstrates no PE, but does show element of CHF.    Likely diagnosis of CHF exacerbation, with atrial fib w/ RVR with tachycardia with HR which is only minimally elevated initially, but now in 120's.  Will give pt 10 mg bolus diltiazem for rate control.  Pt received Lasix 40 mg IV.  Nitro held initially 2/2 BP 107/81, but subsequent BPs have been in 120-140's. Will place nitroglyn, and hold for SBP < 100. Medicine consutled for admission.  Pt understands and agrees with plan.  Medicine asks for cardiology consultation, admission.  Cardiology consulted.   Pt signed out to Dr. Lita Mains @ 3:45 AM, pt awaiting cardiology consult for admit for CHF exacerbation, afib RVR.  Still here after signout, and spoke with cardiology, will come eval patient in ED. 4:01 AM  Sinda Du  Discussed pt with attending Dr. Winfred Leeds.   Sinda Du, MD 04/03/14 [id number]  Orlie Dakin, MD 04/03/14 9967

## 2014-04-02 NOTE — ED Notes (Signed)
CBG 117 

## 2014-04-03 ENCOUNTER — Emergency Department (HOSPITAL_COMMUNITY): Payer: BLUE CROSS/BLUE SHIELD

## 2014-04-03 ENCOUNTER — Encounter (HOSPITAL_COMMUNITY): Payer: Self-pay | Admitting: Radiology

## 2014-04-03 DIAGNOSIS — I480 Paroxysmal atrial fibrillation: Secondary | ICD-10-CM | POA: Diagnosis present

## 2014-04-03 DIAGNOSIS — Z6841 Body Mass Index (BMI) 40.0 and over, adult: Secondary | ICD-10-CM | POA: Diagnosis not present

## 2014-04-03 DIAGNOSIS — Z9119 Patient's noncompliance with other medical treatment and regimen: Secondary | ICD-10-CM | POA: Diagnosis present

## 2014-04-03 DIAGNOSIS — E871 Hypo-osmolality and hyponatremia: Secondary | ICD-10-CM | POA: Diagnosis present

## 2014-04-03 DIAGNOSIS — I4891 Unspecified atrial fibrillation: Secondary | ICD-10-CM

## 2014-04-03 DIAGNOSIS — Z7982 Long term (current) use of aspirin: Secondary | ICD-10-CM | POA: Diagnosis not present

## 2014-04-03 DIAGNOSIS — G4733 Obstructive sleep apnea (adult) (pediatric): Secondary | ICD-10-CM | POA: Diagnosis present

## 2014-04-03 DIAGNOSIS — R911 Solitary pulmonary nodule: Secondary | ICD-10-CM | POA: Diagnosis present

## 2014-04-03 DIAGNOSIS — E876 Hypokalemia: Secondary | ICD-10-CM | POA: Diagnosis present

## 2014-04-03 DIAGNOSIS — E119 Type 2 diabetes mellitus without complications: Secondary | ICD-10-CM | POA: Diagnosis present

## 2014-04-03 DIAGNOSIS — I5021 Acute systolic (congestive) heart failure: Secondary | ICD-10-CM

## 2014-04-03 DIAGNOSIS — I5041 Acute combined systolic (congestive) and diastolic (congestive) heart failure: Secondary | ICD-10-CM | POA: Diagnosis present

## 2014-04-03 DIAGNOSIS — Z9114 Patient's other noncompliance with medication regimen: Secondary | ICD-10-CM | POA: Diagnosis present

## 2014-04-03 LAB — COMPREHENSIVE METABOLIC PANEL
ALK PHOS: 62 U/L (ref 39–117)
ALT: 57 U/L — ABNORMAL HIGH (ref 0–53)
ANION GAP: 6 (ref 5–15)
AST: 67 U/L — ABNORMAL HIGH (ref 0–37)
Albumin: 3 g/dL — ABNORMAL LOW (ref 3.5–5.2)
BUN: 14 mg/dL (ref 6–23)
CO2: 31 mmol/L (ref 19–32)
CREATININE: 1.02 mg/dL (ref 0.50–1.35)
Calcium: 7.7 mg/dL — ABNORMAL LOW (ref 8.4–10.5)
Chloride: 95 mmol/L — ABNORMAL LOW (ref 96–112)
GFR calc Af Amer: >90 mL/min (ref >90)
GFR calc non Af Amer: 78 mL/min — ABNORMAL LOW (ref >90)
Glucose, Bld: 93 mg/dL (ref 70–99)
POTASSIUM: 3.5 mmol/L (ref 3.5–5.1)
Sodium: 132 mmol/L — ABNORMAL LOW (ref 135–145)
TOTAL PROTEIN: 6 g/dL (ref 6.0–8.3)
Total Bilirubin: 1.3 mg/dL — ABNORMAL HIGH (ref 0.3–1.2)

## 2014-04-03 LAB — CBC WITH DIFFERENTIAL/PLATELET
Basophils Absolute: 0 10*3/uL (ref 0.0–0.1)
Basophils Relative: 0 % (ref 0–1)
EOS PCT: 0 % (ref 0–5)
Eosinophils Absolute: 0 10*3/uL (ref 0.0–0.7)
HCT: 44 % (ref 39.0–52.0)
Hemoglobin: 14.8 g/dL (ref 13.0–17.0)
LYMPHS PCT: 23 % (ref 12–46)
Lymphs Abs: 1 10*3/uL (ref 0.7–4.0)
MCH: 27.8 pg (ref 26.0–34.0)
MCHC: 33.6 g/dL (ref 30.0–36.0)
MCV: 82.6 fL (ref 78.0–100.0)
Monocytes Absolute: 0.6 10*3/uL (ref 0.1–1.0)
Monocytes Relative: 13 % — ABNORMAL HIGH (ref 3–12)
NEUTROS ABS: 2.9 10*3/uL (ref 1.7–7.7)
NEUTROS PCT: 64 % (ref 43–77)
Platelets: 173 10*3/uL (ref 150–400)
RBC: 5.33 MIL/uL (ref 4.22–5.81)
RDW: 14.1 % (ref 11.5–15.5)
WBC: 4.5 10*3/uL (ref 4.0–10.5)

## 2014-04-03 LAB — TSH: TSH: 1.238 u[IU]/mL (ref 0.350–4.500)

## 2014-04-03 LAB — TROPONIN I
TROPONIN I: 0.04 ng/mL — AB (ref <0.031)
TROPONIN I: 0.04 ng/mL — AB (ref <0.031)
Troponin I: 0.04 ng/mL — ABNORMAL HIGH (ref <0.031)

## 2014-04-03 LAB — MAGNESIUM: MAGNESIUM: 1.9 mg/dL (ref 1.5–2.5)

## 2014-04-03 LAB — GLUCOSE, CAPILLARY
GLUCOSE-CAPILLARY: 94 mg/dL (ref 70–99)
GLUCOSE-CAPILLARY: 122 mg/dL — AB (ref 70–99)
Glucose-Capillary: 89 mg/dL (ref 70–99)
Glucose-Capillary: 125 mg/dL — ABNORMAL HIGH (ref 70–99)

## 2014-04-03 LAB — T4, FREE: Free T4: 0.95 ng/dL (ref 0.80–1.80)

## 2014-04-03 LAB — HEPARIN LEVEL (UNFRACTIONATED)
Heparin Unfractionated: 0.29 IU/mL — ABNORMAL LOW (ref 0.30–0.70)
Heparin Unfractionated: 0.35 IU/mL (ref 0.30–0.70)

## 2014-04-03 LAB — D-DIMER, QUANTITATIVE (NOT AT ARMC): D DIMER QUANT: 0.89 ug{FEU}/mL — AB (ref 0.00–0.48)

## 2014-04-03 MED ORDER — DEXTROSE 5 % IV SOLN
5.0000 mg/h | INTRAVENOUS | Status: DC
  Administered 2014-04-03: 5 mg/h via INTRAVENOUS
  Administered 2014-04-03 – 2014-04-04 (×3): 10 mg/h via INTRAVENOUS
  Filled 2014-04-03: qty 100

## 2014-04-03 MED ORDER — DILTIAZEM LOAD VIA INFUSION
15.0000 mg | Freq: Once | INTRAVENOUS | Status: AC | PRN
  Filled 2014-04-03: qty 15

## 2014-04-03 MED ORDER — FUROSEMIDE 10 MG/ML IJ SOLN
40.0000 mg | Freq: Once | INTRAMUSCULAR | Status: AC
  Administered 2014-04-03: 40 mg via INTRAVENOUS
  Filled 2014-04-03: qty 4

## 2014-04-03 MED ORDER — ONDANSETRON HCL 4 MG/2ML IJ SOLN: 4.0000 mg | Freq: Four times a day (QID) | INTRAMUSCULAR | Status: DC | PRN

## 2014-04-03 MED ORDER — FUROSEMIDE 10 MG/ML IJ SOLN
40.0000 mg | Freq: Two times a day (BID) | INTRAMUSCULAR | Status: DC
  Administered 2014-04-03 – 2014-04-04 (×3): 40 mg via INTRAVENOUS
  Filled 2014-04-03 (×3): qty 4

## 2014-04-03 MED ORDER — NITROGLYCERIN 2 % TD OINT
0.5000 [in_us] | TOPICAL_OINTMENT | Freq: Once | TRANSDERMAL | Status: AC
  Administered 2014-04-03: 0.5 [in_us] via TOPICAL
  Filled 2014-04-03: qty 1

## 2014-04-03 MED ORDER — AMIODARONE HCL 200 MG PO TABS
200.0000 mg | ORAL_TABLET | Freq: Two times a day (BID) | ORAL | Status: DC
  Administered 2014-04-03 – 2014-04-06 (×7): 200 mg via ORAL
  Filled 2014-04-03 (×7): qty 1

## 2014-04-03 MED ORDER — HEPARIN BOLUS VIA INFUSION
5000.0000 [IU] | Freq: Once | INTRAVENOUS | Status: AC
  Administered 2014-04-03: 5000 [IU] via INTRAVENOUS
  Filled 2014-04-03: qty 5000

## 2014-04-03 MED ORDER — ACETAMINOPHEN 325 MG PO TABS: 650.0000 mg | ORAL_TABLET | ORAL | Status: DC | PRN

## 2014-04-03 MED ORDER — DILTIAZEM HCL 25 MG/5ML IV SOLN
10.0000 mg | Freq: Once | INTRAVENOUS | Status: AC
  Administered 2014-04-03: 10 mg via INTRAVENOUS
  Filled 2014-04-03 (×2): qty 5

## 2014-04-03 MED ORDER — HEPARIN (PORCINE) IN NACL 100-0.45 UNIT/ML-% IJ SOLN
1900.0000 [IU]/h | INTRAMUSCULAR | Status: DC
  Administered 2014-04-03: 1650 [IU]/h via INTRAVENOUS
  Administered 2014-04-03: 1500 [IU]/h via INTRAVENOUS
  Administered 2014-04-04 – 2014-04-06 (×2): 1900 [IU]/h via INTRAVENOUS
  Filled 2014-04-03 (×7): qty 250

## 2014-04-03 MED ORDER — INSULIN ASPART 100 UNIT/ML ~~LOC~~ SOLN
0.0000 [IU] | Freq: Three times a day (TID) | SUBCUTANEOUS | Status: DC
Start: 2014-04-03 — End: 2014-04-09
  Administered 2014-04-05: 3 [IU] via SUBCUTANEOUS
  Administered 2014-04-08: 4 [IU] via SUBCUTANEOUS

## 2014-04-03 MED ORDER — INSULIN ASPART 100 UNIT/ML ~~LOC~~ SOLN
3.0000 [IU] | Freq: Three times a day (TID) | SUBCUTANEOUS | Status: DC
  Administered 2014-04-03 – 2014-04-08 (×13): 3 [IU] via SUBCUTANEOUS

## 2014-04-03 MED ORDER — BENZONATATE 100 MG PO CAPS
100.0000 mg | ORAL_CAPSULE | Freq: Two times a day (BID) | ORAL | Status: DC
  Administered 2014-04-03 – 2014-04-09 (×12): 100 mg via ORAL
  Filled 2014-04-03 (×12): qty 1

## 2014-04-03 MED ORDER — IOHEXOL 350 MG/ML SOLN
80.0000 mL | Freq: Once | INTRAVENOUS | Status: AC | PRN
  Administered 2014-04-03: 80 mL via INTRAVENOUS

## 2014-04-03 MED ORDER — ASPIRIN EC 81 MG PO TBEC
81.0000 mg | DELAYED_RELEASE_TABLET | Freq: Every day | ORAL | Status: DC
  Administered 2014-04-03 – 2014-04-06 (×4): 81 mg via ORAL
  Filled 2014-04-03 (×4): qty 1

## 2014-04-03 NOTE — Progress Notes (Signed)
UR completed 

## 2014-04-03 NOTE — ED Provider Notes (Signed)
Complains of shortness of breath for 1 week. States at baseline he can walk several 100 yards. For the past one week he is been only able to walk approximately 5 feet. On exam patient is alert no respiratory distress HEENT exam no facial asymmetry neck supple no JVD lungs scant rhonchi heart irregularly irregular. abdomen morbidly obese nontender extremities without edema skin warm dry. Medical decision making patient has atrial fib previously cardioverted. Normal sinus rhythm one year ago. Also consider pulmonary embolism. Low pretest clinical probability  Darius Dakin, MD 04/03/14 [id number]

## 2014-04-03 NOTE — Progress Notes (Signed)
ANTICOAGULATION CONSULT NOTE - Follow Up Consult  Pharmacy Consult for Heparin Indication: atrial fibrillation  No Known Allergies  Patient Measurements: Height: 6\' 2"  (188 cm) Weight: (!) 348 lb 1.6 oz (157.897 kg) IBW/kg (Calculated) : 82.2 Heparin Dosing Weight: 119.3 kg  Vital Signs: Temp: 98.7 F (37.1 C) (02/22 0545) Temp Source: Oral (02/22 0524) BP: 111/85 mmHg (02/22 0545) Pulse Rate: 112 (02/22 0545)  Labs:  Recent Labs  04/02/14 2204 04/03/14 0547  HGB 15.2 14.8  HCT 45.1 44.0  PLT 179 173  CREATININE 0.93 1.02  TROPONINI  --  0.04*    Estimated Creatinine Clearance: 122.5 mL/min (by C-G formula based on Cr of 1.02).   Medications:  Heparin @ 1500 units/hr  Assessment: 69 YOM who presented on 2/21 with SOB and was found to have recurrent Afib. The patient continues on heparin for anticoagulation while awaiting work-up and possible plans for TEE-DCCV later this week. Heparin level this afternoon is slightly SUBtherapeutic (HL 0.29, goal of 0.3-0.7). CBC wnl - no overt s/sx of bleeding noted  Goal of Therapy:  Heparin level 0.3-0.7 units/ml Monitor platelets by anticoagulation protocol: Yes   Plan:  1. Increase Heparin to 1650 units/hr (16.5 ml/hr) 2. Will continue to monitor for any signs/symptoms of bleeding and will follow up with heparin level in 6 hours   Alycia Rossetti, PharmD, BCPS Clinical Pharmacist Pager: [phone] 04/03/2014 9:52 AM

## 2014-04-03 NOTE — Progress Notes (Addendum)
Pt admitted this am by Dr. Clayborne Artist with CHF and atrial fib with RVR. D-dimer slightly elevated. CTA chest negative for PE. Rate control of atrial fib with IV diltiazem. He had been see in the past by Dr. [name] but has not kept f/u with him. He has not been on any cardiac meds at home. He stopped his coumadin against medical advice. He is non-compliant with medications and diet. Known to have normal LV systolic function in the past (LVEF=60-65% by echo 2015) He is felt to have diastolic CHF. Prior notes show inability to use Tikosyn due to prolonged QTc. He could not afford Eliquis, Xarelto or Pradaxa. Had been on amiodarone but he stopped. Stress myoview January 2015 without ischemia.  Echo today to assess LVEF.  Rate control with IV diltiazem.  IV heparin for anti-coagulation.  Will add amiodarone 200 mg po BID Continue IV Lasix   His long term management will continue to be very difficult due to non-compliance. He will benefit from anti-coagulation for CVA reduction but his past behavior indicates that this will be difficult. He reports taking Cardizem at home. Goals during this admission will be rate control. If unable to control his rate, will need to consider TEE guided cardioversion later this week.   MCALHANY,CHRISTOPHER 8:02 AM 04/03/2014

## 2014-04-03 NOTE — H&P (Cosign Needed)
Patient ID: Darius [name] MRN: [id number], DOB/AGE: 1955-08-03   Admit date: 04/02/2014   Primary Physician: [name]., DO Primary Cardiologist: None  CC: Shortness of breath  Problem List  Past Medical History  Diagnosis Date  . Morbid obesity   . Atrial fibrillation     unable to tolerate versed and fentanyl sedation and required gen anesthesia for TEE;  s/p TEE-DCCV (failed);  Amiodarone started  . Chronic diastolic heart failure     a. Echo 03/27/11: EF 50-55%, inferior hypokinesis, moderate LAE, mild RVE, normal pulmonary pressures. ;   b.  TEE (04/03/11): EF 40-45%, mild MR moderate LAE, no LAA clot, mild RAE;  c.  Echocardiogram (02/2013): EF 60-65%, Gr 2 DD, mildly dilated Ao root (Ao root dimension 39 mm), MAC, mild LAE, normal RVF  . Sleep apnea     a. sleep test 01/2013:  very severy OSA  . Diabetes mellitus, type 2   . Hx of cardiovascular stress test     Lexiscan Myoview (02/2013): No ischemia or scar, EF 51%, low risk    Past Surgical History  Procedure Laterality Date  . Tee without cardioversion  04/01/2011    Procedure: TRANSESOPHAGEAL ECHOCARDIOGRAM (TEE);  Surgeon: Lelon Perla, MD;  Location: [facility] ENDOSCOPY;  Service: Cardiovascular;  Laterality: N/A;  . Cardioversion  04/01/2011    Procedure: CARDIOVERSION;  Surgeon: Lelon Perla, MD;  Location: [facility] ENDOSCOPY;  Service: Cardiovascular;  Laterality: N/A;  . Tee without cardioversion  04/03/2011    Procedure: TRANSESOPHAGEAL ECHOCARDIOGRAM (TEE);  Surgeon: Lelon Perla, MD;  Location: [facility] ENDOSCOPY;  Service: Cardiovascular;  Laterality: N/A;  . Cardioversion  04/03/2011    Procedure: CARDIOVERSION;  Surgeon: Lelon Perla, MD;  Location: [facility] ENDOSCOPY;  Service: Cardiovascular;  Laterality: N/A;  . Tee without cardioversion  04/03/2011    Procedure: TRANSESOPHAGEAL ECHOCARDIOGRAM (TEE);  Surgeon: Lelon Perla, MD;  Location: [place];  Service: Cardiovascular;  Laterality: N/A;  . Cardioversion   04/03/2011    Procedure: CARDIOVERSION;  Surgeon: Lelon Perla, MD;  Location: [facility] OR;  Service: Cardiovascular;  Laterality: N/A;     Allergies  No Known Allergies  HPI The patient is a 58M with a history of HFpEF, DM2, morbid obesity who presents with shortness of breath and tachycardia. He reports that over the past 3 days he has had progressive dyspnea on exertion. He normally is able to walk around a [employer] with his cart but lately has been getting increasingly dyspneic and fatigued when trying to do so to the point where it is difficult for him to walk even a few minutes without developing worsening shortness of breath. He has a history of HFpEF and is on chronic Lasix therapy but has missed a few doses recently. He also reports dietary noncompliance and recently ate an entire box of candy. Due to persistent symptoms he presented to urgent care earlier today and was found to have reverted back into AF so he was advised to come to the ED.  In the ED, VS 107 111/81 93% RA. He was given diltiazem 30m x 1 and furosemide 413mx 1 with some improvement in his symptoms. CXR showed some pulmonary edema and a CT-PA did not show evidence of PE.  Of note, he first had a history of AF in 03/2011. At that time he underwent TEE-DCCV with reversion to SR, but he quickly reverted back to AF. It is unclear how long he remained in AF but he  has a few EKGs in the interval time frame showing SR. He is not currently on any rate control medications but was on amiodarone and warfarin following his hospitalization at that time.  Home Medications  Prior to Admission medications   Medication Sig Start Date End Date Taking? Authorizing Provider  aspirin EC 81 MG tablet Take 81 mg by mouth daily.  01/19/13  Yes Scott T Kathlen Mody, PA-C  Blood Glucose Monitoring Suppl (BAYER CONTOUR MONITOR) W/DEVICE KIT 1 each by Does not apply route once. Use twice a day to check blood sugar 09/02/13  Yes [name], DO  CARTIA XT  120 MG 24 hr capsule take 1 capsule by mouth once daily 02/23/14  Yes [name], MD  furosemide (LASIX) 40 MG tablet take 1/2 tablet by mouth once daily 02/23/14  Yes [name], MD  glucose blood (BAYER CONTOUR TEST) test strip Use as instructed to check blood sugar twice a day 09/02/13  Yes [name], DO  metFORMIN (GLUCOPHAGE) 1000 MG tablet take 1 tablet by mouth twice a day with food   Yes [name], DO    Family History  Family History  Problem Relation Age of Onset  . Diabetes Father     died in his 66s  . Hypertension Father     died in her 49s  . Seizures Mother   . Hypertension Sister     Social History  History   Social History  . Marital Status: Single    Spouse Name: N/A  . Number of Children: N/A  . Years of Education: N/A   Occupational History  . unemployeed    Social History Main Topics  . Smoking status: Never Smoker   . Smokeless tobacco: Not on file  . Alcohol Use: Yes     Comment: some  . Drug Use: No  . Sexual Activity: No   Other Topics Concern  . Not on file   Social History Narrative   Admits that he eats poorly and eats a lot at work.      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     Review of Systems General:  No chills, fever, night sweats or weight changes.  Cardiovascular:  No chest pain, +dyspnea on exertion, +edema, -orthopnea, +palpitations, -paroxysmal nocturnal dyspnea. Dermatological: No rash, lesions/masses Respiratory: +cough, +dyspnea Urologic: No hematuria, dysuria Abdominal:   No nausea, vomiting, diarrhea, bright red blood per rectum, melena, or hematemesis Neurologic:  No visual changes, wkns, changes in mental status. All other systems reviewed and are otherwise negative except as noted above.  Physical Exam  Blood pressure 120/83, pulse 115, temperature 98.7 F (37.1 C), temperature source Oral, resp. rate 23, height '6\' 2"'  (1.88 m), weight 350 lb (158.759 kg), SpO2 93 %.    General: Pleasant, NAD, morbidly obese Psych: Normal affect. Neuro: Alert and oriented X 3. Moves all extremities spontaneously. HEENT: Normal  Neck: Supple without bruits. JVD appears mildly elevated but difficult to assess given neck size Lungs:  Resp regular and unlabored. Diffuse rhonchi throughout Heart: tachycardic, irregularly irregular. no s3, s4, or murmurs. Abdomen: Soft, non-tender, non-distended, BS + x 4.  Extremities: No clubbing, cyanosis. 1+ BLE edema   Labs  Troponin [facility] of Care Test)  Recent Labs  04/02/14 2159  TROPIPOC 0.05   No results for input(s): CKTOTAL, CKMB, TROPONINI in the last 72 hours. Lab Results  Component Value Date   WBC 4.4 04/02/2014   HGB  15.2 04/02/2014   HCT 45.1 04/02/2014   MCV 82.4 04/02/2014   PLT 179 04/02/2014    Recent Labs Lab 04/02/14 2204  NA 129*  K 3.7  CL 93*  CO2 25  BUN 17  CREATININE 0.93  CALCIUM 8.0*  GLUCOSE 104*   Lab Results  Component Value Date   CHOL 167 02/11/2013   HDL 43.20 02/11/2013   LDLCALC 98 02/11/2013   TRIG 131.0 02/11/2013   Lab Results  Component Value Date   DDIMER 0.89* 04/02/2014     Radiology/Studies  Dg Chest 2 View  04/02/2014   CLINICAL DATA:  Shortness of breath for 3 days. History of diabetes, hypertension, heart disease.  EXAM: CHEST  2 VIEW  COMPARISON:  03/30/2011.  FINDINGS: Cardiac enlargement. Mild vascular congestion. No edema or consolidation. No blunting of costophrenic angles. No pneumothorax. Tortuous aorta. Degenerative changes in the spine.  IMPRESSION: Cardiac enlargement with mild pulmonary vascular congestion. No edema or consolidation.   Electronically Signed   By: Lucienne Capers M.D.   On: 04/02/2014 23:02   Ct Angio Chest Pe W/cm &/or Wo Cm  04/03/2014   CLINICAL DATA:  Shortness of breath, nonproductive cough.  EXAM: CT ANGIOGRAPHY CHEST WITH CONTRAST  TECHNIQUE: Multidetector CT imaging of the chest was performed using the standard protocol during  bolus administration of intravenous contrast. Multiplanar CT image reconstructions and MIPs were obtained to evaluate the vascular anatomy.  CONTRAST:  27m OMNIPAQUE IOHEXOL 350 MG/ML SOLN  COMPARISON:  Chest radiograph earlier this date.  FINDINGS: There are no filling defects within the pulmonary arteries to suggest pulmonary embolus. There is multi chamber cardiomegaly. The thoracic aorta is normal in caliber. Small left pleural effusion. There is fluid in the superior pericardial recess without pericardial effusion. Shotty mediastinal and hilar lymphadenopathy without pathologically enlarged lymph nodes.  Ill-defined perihilar ground-glass and reticulonodular opacities, left greater than right. Within the periphery of the anterior left upper lobe is a 11 x 12 x 10 mm slightly more confluent nodular density. No confluent airspace disease to suggest lobar pneumonia.  Evaluation of the upper abdomen demonstrates hepatic steatosis. Calcification in the gallbladder, may reflect wall calcification versus stone.  There are no acute or suspicious osseous abnormalities.  Review of the MIP images confirms the above findings.  IMPRESSION: 1. No pulmonary embolus. 2. Perihilar ground-glass reticulonodular opacities, left greater than right. This may reflect pulmonary edema, inflammatory versus less likely infection. Slightly more confluent nodule in the periphery of the left upper lobe measures 11 x 12 x 10 mm, this is likely related to the underlying diffuse process, however follow-up chest CT recommended in 3 months to evaluate for imaging stability resolution. There is cardiomegaly and a small left pleural effusion, findings may reflect CHF.   Electronically Signed   By: MJeb LeveringM.D.   On: 04/03/2014 03:08    ECG AF'@115bpm' , RAD, nonspecific ST-TW changes but no changes concerning for acute ischemia  ASSESSMENT AND PLAN 1. Atrial fibrillation - rapid ventricular response 2. Acute diastolic heart  failure 3. DM2  The patient is a 65M with a history of HFpEF, DM2, morbid obesity who presents with shortness of breath and tachycardia. Exam and imaging concerning for mild volume overload and EKG demonstrates AF-RVR. It is unclear how long he has been in AF but clearly his AF and his CHF are interacting and are the likely etiology of his symptoms. Although he has had worsening sxs for the past three days it is unclear  how long he has been back in AF. Per prior notes it was challenging to pursue a rhythm control strategy previously likely given his size (needed general anesthesia for his TEE-DCCV and unclear if he maintained persistent SR afterwards) but it may not be unreasonable to attempt it once he has been diuresed if it will help him from a symptomatic perspective.   #AF-RVR: -diltiazem 16m bolus, then start gtt at 510mhr for rate control -TTE to evaluate cardiac structure and function and assess likelihood of staying in SR if cardioverted -can consider TEE-DCCV and rhythm control strategy but will pursue rate control overnight -heparin anticoagulation in case rhythm control pursued -re: long-term anticoagulation, he has CHADS-VASC = 1-2 (CHF though not systolic, DM2) though this may increase with more risk stratification. Will pursue heparin anticoagulation for now  #HFpEF: exacerbation likely in the setting of dietary indiscretion, medication noncompliance, and AF -Lasix 4028mID for now -weights, daily I/Os -can consider BB, ACEi prior to discharge but evidence base limited for these interventions -monitor hyponatremia for now, likely 2/2 volume overload. May warrant additional workup if continues to fall  #DM2: most recent A1c 6.6 indicating reasonable control -hold metformin for now in setting of contrast load -SSI insulin  #Lung nodule: Per CT report: "Slightly more confluent nodule in the periphery of the left upper lobe measures 11 x 12 x 10 mm, this is likely related to the  underlying diffuse process, however follow-up chest CT recommended in 3 months to evaluate for imaging stability resolution." -needs plan for CT in 3 months if stable  Signed, VORRaliegh IpD MPH 04/03/2014, 5:02 AM

## 2014-04-03 NOTE — Progress Notes (Signed)
ANTICOAGULATION CONSULT NOTE - Follow Up Consult  Pharmacy Consult for Heparin Indication: atrial fibrillation  No Known Allergies  Patient Measurements: Height: 6\' 2"  (188 cm) Weight: (!) 348 lb 1.6 oz (157.897 kg) IBW/kg (Calculated) : 82.2 Heparin Dosing Weight: 119.3 kg  Vital Signs: Temp: 98.4 F (36.9 C) (02/22 2100) Temp Source: Oral (02/22 1500) BP: 109/80 mmHg (02/22 2100) Pulse Rate: 68 (02/22 2100)  Labs:  Recent Labs  04/02/14 2204 04/03/14 0547 04/03/14 1155 04/03/14 1244 04/03/14 1910 04/03/14 2125  HGB 15.2 14.8  --   --   --   --   HCT 45.1 44.0  --   --   --   --   PLT 179 173  --   --   --   --   HEPARINUNFRC  --   --   --  0.29*  --  0.35  CREATININE 0.93 1.02  --   --   --   --   TROPONINI  --  0.04* 0.04*  --  0.04*  --     Estimated Creatinine Clearance: 122.5 mL/min (by C-G formula based on Cr of 1.02).     Assessment: 3 YOM who presented on 2/21 with SOB and was found to have recurrent Afib. The patient continues on heparin for anticoagulation while awaiting work-up and possible plans for TEE-DCCV later this week. Heparin level is 0.35 therapeutic on drip rate 1650 uts/hr. CBC wnl - no overt s/sx of bleeding noted  Goal of Therapy:  Heparin level 0.3-0.7 units/ml Monitor platelets by anticoagulation protocol: Yes   Plan:  Continue Heparin to 1650 units/hr (16.5 ml/hr)  Will continue to monitor for any signs/symptoms of bleeding  Daily HL, CBC  Bonnita Nasuti Pharm.D. CPP, BCPS Clinical Pharmacist [phone] 04/03/2014 10:21 PM

## 2014-04-03 NOTE — Progress Notes (Signed)
ANTICOAGULATION CONSULT NOTE - Initial Consult  Pharmacy Consult for heparin Indication: atrial fibrillation  No Known Allergies  Patient Measurements: Height: '6\' 2"'  (188 cm) Weight: (!) 350 lb (158.759 kg) IBW/kg (Calculated) : 82.2 Heparin Dosing Weight: 107 kg  Vital Signs: Temp: 98.7 F (37.1 C) (02/22 0545) Temp Source: Oral (02/22 0524) BP: 111/85 mmHg (02/22 0545) Pulse Rate: 112 (02/22 0545)  Labs:  Recent Labs  04/02/14 2204  HGB 15.2  HCT 45.1  PLT 179  CREATININE 0.93    Estimated Creatinine Clearance: 134.8 mL/min (by C-G formula based on Cr of 0.93).   Medical History: Past Medical History  Diagnosis Date  . Morbid obesity   . Atrial fibrillation     unable to tolerate versed and fentanyl sedation and required gen anesthesia for TEE;  s/p TEE-DCCV (failed);  Amiodarone started  . Chronic diastolic heart failure     a. Echo 03/27/11: EF 50-55%, inferior hypokinesis, moderate LAE, mild RVE, normal pulmonary pressures. ;   b.  TEE (04/03/11): EF 40-45%, mild MR moderate LAE, no LAA clot, mild RAE;  c.  Echocardiogram (02/2013): EF 60-65%, Gr 2 DD, mildly dilated Ao root (Ao root dimension 39 mm), MAC, mild LAE, normal RVF  . Sleep apnea     a. sleep test 01/2013:  very severy OSA  . Diabetes mellitus, type 2   . Hx of cardiovascular stress test     Lexiscan Myoview (02/2013): No ischemia or scar, EF 51%, low risk    Medications:  Prescriptions prior to admission  Medication Sig Dispense Refill Last Dose  . aspirin EC 81 MG tablet Take 81 mg by mouth daily.    04/02/2014 at Unknown time  . Blood Glucose Monitoring Suppl (BAYER CONTOUR MONITOR) W/DEVICE KIT 1 each by Does not apply route once. Use twice a day to check blood sugar 1 kit 0 04/02/2014 at Unknown time  . CARTIA XT 120 MG 24 hr capsule take 1 capsule by mouth once daily 30 capsule 1 04/02/2014 at Unknown time  . furosemide (LASIX) 40 MG tablet take 1/2 tablet by mouth once daily 15 tablet 1 Past  Week at Unknown time  . glucose blood (BAYER CONTOUR TEST) test strip Use as instructed to check blood sugar twice a day 100 each 3 04/02/2014 at Unknown time  . metFORMIN (GLUCOPHAGE) 1000 MG tablet take 1 tablet by mouth twice a day with food 60 tablet 3 04/02/2014    Assessment: 59 yo man to start heparin for afib.  He was not on any anticoagulants PTA.   Goal of Therapy:  Heparin level 0.3-0.7 units/ml Monitor platelets by anticoagulation protocol: Yes   Plan:  Heparin bolus 5000 units and drip at 1500 units/hr Check HL 6 hours after start Daily HL and CBC Monitor for bleeding  Thanks for allowing pharmacy to be a part of this patient's care.  Excell Seltzer, PharmD Clinical Pharmacist, [phone] 04/03/2014,5:58 AM

## 2014-04-04 DIAGNOSIS — I4891 Unspecified atrial fibrillation: Secondary | ICD-10-CM

## 2014-04-04 DIAGNOSIS — I5033 Acute on chronic diastolic (congestive) heart failure: Secondary | ICD-10-CM

## 2014-04-04 LAB — BASIC METABOLIC PANEL
ANION GAP: 7 (ref 5–15)
BUN: 13 mg/dL (ref 6–23)
CO2: 30 mmol/L (ref 19–32)
Calcium: 7.9 mg/dL — ABNORMAL LOW (ref 8.4–10.5)
Chloride: 96 mmol/L (ref 96–112)
Creatinine, Ser: 0.87 mg/dL (ref 0.50–1.35)
GFR calc non Af Amer: >90 mL/min (ref >90)
Glucose, Bld: 83 mg/dL (ref 70–99)
POTASSIUM: 3.5 mmol/L (ref 3.5–5.1)
Sodium: 133 mmol/L — ABNORMAL LOW (ref 135–145)

## 2014-04-04 LAB — CBC
HEMATOCRIT: 44 % (ref 39.0–52.0)
Hemoglobin: 14.8 g/dL (ref 13.0–17.0)
MCH: 27.7 pg (ref 26.0–34.0)
MCHC: 33.6 g/dL (ref 30.0–36.0)
MCV: 82.2 fL (ref 78.0–100.0)
PLATELETS: 185 10*3/uL (ref 150–400)
RBC: 5.35 MIL/uL (ref 4.22–5.81)
RDW: 14 % (ref 11.5–15.5)
WBC: 4.2 10*3/uL (ref 4.0–10.5)

## 2014-04-04 LAB — GLUCOSE, CAPILLARY
GLUCOSE-CAPILLARY: 90 mg/dL (ref 70–99)
GLUCOSE-CAPILLARY: 93 mg/dL (ref 70–99)
Glucose-Capillary: 137 mg/dL — ABNORMAL HIGH (ref 70–99)
Glucose-Capillary: 118 mg/dL — ABNORMAL HIGH (ref 70–99)
Glucose-Capillary: 104 mg/dL — ABNORMAL HIGH (ref 70–99)

## 2014-04-04 LAB — HEPARIN LEVEL (UNFRACTIONATED)
HEPARIN UNFRACTIONATED: 0.48 [IU]/mL (ref 0.30–0.70)
Heparin Unfractionated: <0.1 IU/mL — ABNORMAL LOW (ref 0.30–0.70)

## 2014-04-04 MED ORDER — FUROSEMIDE 10 MG/ML IJ SOLN
40.0000 mg | Freq: Three times a day (TID) | INTRAMUSCULAR | Status: DC
  Administered 2014-04-04 – 2014-04-08 (×14): 40 mg via INTRAVENOUS
  Filled 2014-04-04 (×14): qty 4

## 2014-04-04 MED ORDER — ZOLPIDEM TARTRATE 5 MG PO TABS
5.0000 mg | ORAL_TABLET | Freq: Every evening | ORAL | Status: DC | PRN
  Administered 2014-04-05 – 2014-04-08 (×2): 5 mg via ORAL
  Filled 2014-04-04 (×3): qty 1

## 2014-04-04 MED ORDER — HEPARIN BOLUS VIA INFUSION
2500.0000 [IU] | Freq: Once | INTRAVENOUS | Status: AC
  Administered 2014-04-04: 2500 [IU] via INTRAVENOUS
  Filled 2014-04-04: qty 2500

## 2014-04-04 NOTE — Progress Notes (Signed)
ANTICOAGULATION CONSULT NOTE - Follow Up Consult  Pharmacy Consult for Heparin Indication: atrial fibrillation  No Known Allergies  Patient Measurements: Height: 6\' 2"  (188 cm) Weight: (!) 347 lb 3.2 oz (157.489 kg) IBW/kg (Calculated) : 82.2 Heparin Dosing Weight: 119.3 kg  Vital Signs: Temp: 98.4 F (36.9 C) (02/23 0500) BP: 106/69 mmHg (02/23 0500) Pulse Rate: 95 (02/23 0500)  Labs:  Recent Labs  04/02/14 2204 04/03/14 0547 04/03/14 1155  04/03/14 1910 04/03/14 2125 04/04/14 0353 04/04/14 1256  HGB 15.2 14.8  --   --   --   --  14.8  --   HCT 45.1 44.0  --   --   --   --  44.0  --   PLT 179 173  --   --   --   --  185  --   HEPARINUNFRC  --   --   --   < >  --  0.35 <0.10* 0.48  CREATININE 0.93 1.02  --   --   --   --  0.87  --   TROPONINI  --  0.04* 0.04*  --  0.04*  --   --   --   < > = values in this interval not displayed.  Estimated Creatinine Clearance: 143.4 mL/min (by C-G formula based on Cr of 0.87).   Assessment: 59 YO M who presented on 2/21 with SOB and was found to have recurrent Afib. The patient continues on heparin for anticoagulation while awaiting plans for TEE-DCCV later this week. Heparin level is 0.48 therapeutic on 1900 units/hr. CBC wnl - no overt s/sx of bleeding noted  Goal of Therapy:  Heparin level 0.3-0.7 units/ml Monitor platelets by anticoagulation protocol: Yes   Plan:  Continue Heparin to 1900 units/hr  Will continue to monitor for any signs/symptoms of bleeding  Daily heparin level and CBC   Deante Blough, Pharm.D., BCPS Clinical Pharmacist Pager [phone] 04/04/2014 1:48 PM

## 2014-04-04 NOTE — Progress Notes (Signed)
  Echocardiogram 2D Echocardiogram has been performed.  Darius [name] 04/04/2014, 10:00 AM

## 2014-04-04 NOTE — Progress Notes (Signed)
Patient Profile: 59 y/o male with history of PAF and medical noncompliance admitted with CHF and atrial fibrillation w/ RVR.   Subjective: Still feels tired. Mild dyspnea. No CP.    Objective: Vital signs in last 24 hours: Temp:  [98.3 F (36.8 C)-98.4 F (36.9 C)] 98.4 F (36.9 C) (02/23 0500) Pulse Rate:  [50-95] 95 (02/23 0500) Resp:  [18-25] 25 (02/23 0500) BP: (104-118)/(62-80) 106/69 mmHg (02/23 0500) SpO2:  [94 %-95 %] 95 % (02/23 0500) Weight:  [347 lb 3.2 oz (157.489 kg)] 347 lb 3.2 oz (157.489 kg) (02/23 0500) Last BM Date: 04/03/14  Intake/Output from previous day: 02/22 0701 - 02/23 0700 In: 2357.9 [P.O.:1760; I.V.:597.9] Out: 1925 [Urine:1925] Intake/Output this shift: Total I/O In: 240 [P.O.:240] Out: 800 [Urine:800]  Medications Current Facility-Administered Medications  Medication Dose Route Frequency Provider Last Rate Last Dose  . acetaminophen (TYLENOL) tablet 650 mg  650 mg Oral Q4H PRN Raliegh Ip, MD      . amiodarone (PACERONE) tablet 200 mg  200 mg Oral BID Burnell Blanks, MD   200 mg at 04/04/14 6834  . aspirin EC tablet 81 mg  81 mg Oral Daily Raliegh Ip, MD   81 mg at 04/04/14 0942  . benzonatate (TESSALON) capsule 100 mg  100 mg Oral BID Almyra Deforest, PA   100 mg at 04/04/14 0942  . diltiazem (CARDIZEM) 100 mg in dextrose 5 % 100 mL (1 mg/mL) infusion  5-15 mg/hr Intravenous Titrated Raliegh Ip, MD 10 mL/hr at 04/04/14 0413 10 mg/hr at 04/04/14 0413  . furosemide (LASIX) injection 40 mg  40 mg Intravenous BID Raliegh Ip, MD   40 mg at 04/04/14 0814  . heparin ADULT infusion 100 units/mL (25000 units/250 mL)  1,900 Units/hr Intravenous Continuous Narda Bonds, RPH 19 mL/hr at 04/04/14 0814 1,900 Units/hr at 04/04/14 0814  . insulin aspart (novoLOG) injection 0-20 Units  0-20 Units Subcutaneous TID WC Raliegh Ip, MD   0 Units at 04/03/14 0800  . insulin aspart (novoLOG) injection 3 Units  3 Units Subcutaneous TID WC Raliegh Ip, MD   3  Units at 04/04/14 [phone]  . ondansetron (ZOFRAN) injection 4 mg  4 mg Intravenous Q6H PRN Raliegh Ip, MD        PE: General appearance: alert, cooperative and no distress Neck: no carotid bruit and no JVD Lungs: decreased BS bilaterally no rales Heart: irregularly irregular rhythm Extremities: 2+ bilateral LEE Pulses: 2+ and symmetric Skin: warm and dry Neurologic: Grossly normal  Lab Results:   Recent Labs  04/02/14 2204 04/03/14 0547 04/04/14 0353  WBC 4.4 4.5 4.2  HGB 15.2 14.8 14.8  HCT 45.1 44.0 44.0  PLT 179 173 185   BMET  Recent Labs  04/02/14 2204 04/03/14 0547 04/04/14 0353  NA 129* 132* 133*  K 3.7 3.5 3.5  CL 93* 95* 96  CO2 25 31 30   GLUCOSE 104* 93 83  BUN 17 14 13   CREATININE 0.93 1.02 0.87  CALCIUM 8.0* 7.7* 7.9*   Cardiac Panel (last 3 results)  Recent Labs  04/03/14 0547 04/03/14 1155 04/03/14 1910  TROPONINI 0.04* 0.04* 0.04*    Studies/Results: 2D echo - pending   Assessment/Plan  Active Problems:   Atrial fibrillation with rapid ventricular response   1. Atrial Fibrillation w/ RVR: still in afib, but rate is improved. Resting rate ~105. He remains on IV Cardizem and PO Amiodarone. Continue IV heparin. TSH normal. 2D echo performed. Results  pending. If no spontaneous conversion to NSR, will need to consider TEE guided cardioversion.   2. Acute on Chronic Diastolic CHF: 2D echo results pending. Still with significant peripheral edema. -2.7 L out since admit yesterday. Continue IV Lasix. Renal function is stable. Continue low sodium diet and strict I/Os.   3. Elevated troponin: minimal elevation with flat trend. Likely due to demand ischemia in the setting of rapid atrial fibrillation and CHF.     LOS: 1 day    Darius [name] 04/04/2014 9:53 AM   I have personally seen and examined this patient with Darius Jester, NP. I agree with the assessment and plan as outlined above. His I/O are not c/w with amount of  urine output described by patient. Will follow I/O closely. Increase Lasix to 40 mg IV TID. Still in atrial fib but rate controlled on Diltiazem and amiodarone. Will continue IV diltiazem today and change to po Cardizem tomorrow. Will need to consider TEE guided cardioversion on Thursday if he has not converted to sinus by tomorrow. Continue IV heparin. Will need outpatient anti-coagulation but this will be difficult given his history of non-compliance.   Darius [name],Darius [name] 04/04/2014 12:31 PM

## 2014-04-04 NOTE — Progress Notes (Signed)
ANTICOAGULATION CONSULT NOTE - Follow Up Consult  Pharmacy Consult for Heparin  Indication: atrial fibrillation  No Known Allergies  Patient Measurements: Height: 6\' 2"  (188 cm) Weight: (!) 348 lb 1.6 oz (157.897 kg) IBW/kg (Calculated) : 82.2  Vital Signs: Temp: 98.4 F (36.9 C) (02/22 2100) BP: 109/80 mmHg (02/22 2100) Pulse Rate: 68 (02/22 2100)  Labs:  Recent Labs  04/02/14 2204 04/03/14 0547 04/03/14 1155 04/03/14 1244 04/03/14 1910 04/03/14 2125 04/04/14 0353  HGB 15.2 14.8  --   --   --   --  14.8  HCT 45.1 44.0  --   --   --   --  44.0  PLT 179 173  --   --   --   --  185  HEPARINUNFRC  --   --   --  0.29*  --  0.35 <0.10*  CREATININE 0.93 1.02  --   --   --   --   --   TROPONINI  --  0.04* 0.04*  --  0.04*  --   --     Estimated Creatinine Clearance: 122.5 mL/min (by C-G formula based on Cr of 1.02).  Assessment: Heparin level now undetectable despite rate increase. Heparin infusing without issues per RN. Labs as above.   Goal of Therapy:  Heparin level 0.3-0.7 units/ml Monitor platelets by anticoagulation protocol: Yes   Plan:  -Heparin 2500 units BOLUS -Increase heparin drip to 1900 units/hr -1300 HL -Daily CBC/HL -Monitor for bleeding  Narda Bonds 04/04/2014,5:28 AM

## 2014-04-05 DIAGNOSIS — I5042 Chronic combined systolic (congestive) and diastolic (congestive) heart failure: Secondary | ICD-10-CM | POA: Diagnosis present

## 2014-04-05 DIAGNOSIS — I5021 Acute systolic (congestive) heart failure: Secondary | ICD-10-CM

## 2014-04-05 DIAGNOSIS — I5043 Acute on chronic combined systolic (congestive) and diastolic (congestive) heart failure: Secondary | ICD-10-CM | POA: Diagnosis present

## 2014-04-05 DIAGNOSIS — I5032 Chronic diastolic (congestive) heart failure: Secondary | ICD-10-CM | POA: Diagnosis present

## 2014-04-05 LAB — BASIC METABOLIC PANEL
ANION GAP: 5 (ref 5–15)
BUN: 11 mg/dL (ref 6–23)
CALCIUM: 7.9 mg/dL — AB (ref 8.4–10.5)
CHLORIDE: 95 mmol/L — AB (ref 96–112)
CO2: 30 mmol/L (ref 19–32)
Creatinine, Ser: 0.93 mg/dL (ref 0.50–1.35)
GFR, EST NON AFRICAN AMERICAN: 89 mL/min — AB (ref >90)
Glucose, Bld: 93 mg/dL (ref 70–99)
Potassium: 3.4 mmol/L — ABNORMAL LOW (ref 3.5–5.1)
Sodium: 130 mmol/L — ABNORMAL LOW (ref 135–145)

## 2014-04-05 LAB — GLUCOSE, CAPILLARY
Glucose-Capillary: 127 mg/dL — ABNORMAL HIGH (ref 70–99)
Glucose-Capillary: 87 mg/dL (ref 70–99)
Glucose-Capillary: 171 mg/dL — ABNORMAL HIGH (ref 70–99)

## 2014-04-05 LAB — HEPARIN LEVEL (UNFRACTIONATED): Heparin Unfractionated: 0.46 IU/mL (ref 0.30–0.70)

## 2014-04-05 LAB — CBC
HEMATOCRIT: 46.9 % (ref 39.0–52.0)
Hemoglobin: 16 g/dL (ref 13.0–17.0)
MCH: 28.1 pg (ref 26.0–34.0)
MCHC: 34.1 g/dL (ref 30.0–36.0)
MCV: 82.3 fL (ref 78.0–100.0)
Platelets: 200 10*3/uL (ref 150–400)
RBC: 5.7 MIL/uL (ref 4.22–5.81)
RDW: 14 % (ref 11.5–15.5)
WBC: 5.4 10*3/uL (ref 4.0–10.5)

## 2014-04-05 MED ORDER — DILTIAZEM HCL ER COATED BEADS 240 MG PO CP24
240.0000 mg | ORAL_CAPSULE | Freq: Every day | ORAL | Status: DC
  Administered 2014-04-05 – 2014-04-06 (×2): 240 mg via ORAL
  Filled 2014-04-05 (×2): qty 1

## 2014-04-05 MED ORDER — POTASSIUM CHLORIDE CRYS ER 20 MEQ PO TBCR
40.0000 meq | EXTENDED_RELEASE_TABLET | Freq: Every day | ORAL | Status: DC
  Administered 2014-04-05 – 2014-04-08 (×4): 40 meq via ORAL
  Filled 2014-04-05 (×4): qty 2

## 2014-04-05 NOTE — Progress Notes (Signed)
Heart Failure Navigator Consult Note  Presentation: Darius [name] is a 63M with a history of HFpEF, DM2, morbid obesity who presents with shortness of breath and tachycardia. He reports that over the past 3 days he has had progressive dyspnea on exertion. He normally is able to walk around a [employer] with his cart but lately has been getting increasingly dyspneic and fatigued when trying to do so to the point where it is difficult for him to walk even a few minutes without developing worsening shortness of breath. He has a history of HFpEF and is on chronic Lasix therapy but has missed a few doses recently. He also reports dietary noncompliance and recently ate an entire box of candy. Due to persistent symptoms he presented to urgent care earlier today and was found to have reverted back into AF so he was advised to come to the ED.  In the ED, VS 107 111/81 93% RA. He was given diltiazem 10mg  x 1 and furosemide 40mg  x 1 with some improvement in his symptoms. CXR showed some pulmonary edema and a CT-PA did not show evidence of PE.  Of note, he first had a history of AF in 03/2011. At that time he underwent TEE-DCCV with reversion to SR, but he quickly reverted back to AF. It is unclear how long he remained in AF but he has a few EKGs in the interval time frame showing SR. He is not currently on any rate control medications but was on amiodarone and warfarin following his hospitalization at that time   Past Medical History  Diagnosis Date  . Morbid obesity   . Atrial fibrillation     unable to tolerate versed and fentanyl sedation and required gen anesthesia for TEE;  s/p TEE-DCCV (failed);  Amiodarone started  . Chronic diastolic heart failure     a. Echo 03/27/11: EF 50-55%, inferior hypokinesis, moderate LAE, mild RVE, normal pulmonary pressures. ;   b.  TEE (04/03/11): EF 40-45%, mild MR moderate LAE, no LAA clot, mild RAE;  c.  Echocardiogram (02/2013): EF 60-65%, Gr 2 DD, mildly dilated Ao root (Ao  root dimension 39 mm), MAC, mild LAE, normal RVF  . Sleep apnea     a. sleep test 01/2013:  very severy OSA  . Diabetes mellitus, type 2   . Hx of cardiovascular stress test     Lexiscan Myoview (02/2013): No ischemia or scar, EF 51%, low risk    History   Social History  . Marital Status: Single    Spouse Name: N/A  . Number of Children: N/A  . Years of Education: N/A   Occupational History  . unemployeed    Social History Main Topics  . Smoking status: Never Smoker   . Smokeless tobacco: Not on file  . Alcohol Use: Yes     Comment: some  . Drug Use: No  . Sexual Activity: No   Other Topics Concern  . None   Social History Narrative   Admits that he eats poorly and eats a lot at work.      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    ECHO:Study Conclusions--04/04/14  - Left ventricle: The cavity size was moderately dilated. Wall thickness was normal. Systolic function was mildly reduced. The estimated ejection fraction was in the range of 45% to 50%. - Left atrium: The atrium was severely dilated. - Right atrium: The atrium was severely dilated.  Transthoracic echocardiography. M-mode, complete 2D, spectral Doppler, and color Doppler.  Birthdate: Patient birthdate: 1955-04-18. Age: Patient is 59 yr old. Sex: Gender: male. BMI: 44.6 kg/m^2. Blood pressure:   106/69 Patient status: Inpatient. Study date: Study date: 04/04/2014. Study time: 09:29 AM.  BNP    Component Value Date/Time   BNP 144.3* 04/02/2014 2204    ProBNP    Component Value Date/Time   PROBNP 24.0 01/19/2013 1049     Education Assessment and Provision:  Detailed education and instructions provided on heart failure disease management including the following:  Signs and symptoms of Heart Failure When to call the physician Importance of daily weights Low sodium diet Fluid restriction Medication management Anticipated future follow-up  appointments  Patient education given on each of the above topics.  Patient acknowledges understanding and acceptance of all instructions.  I spoke at length with Darius [name] regarding his HF.  He seems to have a decent basic knowledge of HF  recommendations--however he says himself that he "does not do what I need to".  He does not have scale and could not relate why it was important.  I explained the importance in daily weights and when to call the physician related to signs and symptoms.  He lives alone and of note has no working refrigerator.  He admits that he mostly eats out--named chinese and [employer].  He also sometimes admits to eating frozen meals.  He was not taking Lasix prior to admission for several days because he "was so weak I could not get back and forth to the bathroom".  I asked if we could have HHRN to see him after discharge and he was reluctant due to "my house being so messy--it would stress me out to think about cleaning before they came".  I will provide scale for use at discharge and plan to see him again before discharge to review HF recommendations.  I will discuss discharge plan with care management.  He has a sister that lives in [place] and no one locally to provide support.  Education Materials:  "Living Better With Heart Failure" Booklet, Daily Weight Tracker Tool and Heart Failure Educational Video.   High Risk Criteria for Readmission and/or Poor Patient Outcomes:  (Recommend Follow-up with [facility])--yes could benefit from outpatient follow-up with AHF clinic   EF <30%-  No 45-50%   2 or more admissions in 6 months- No  Difficult social situation- Yes--lives alone with little other support  Demonstrates medication noncompliance- Yes-- was not taking several medications before admission    Barriers of Care:  Knowledge and compliance  Discharge Planning:   Plans at this time to return to home alone.  I have communicated with care management  regarding concerns related to his return home and ability to successfully care for himself.

## 2014-04-05 NOTE — Progress Notes (Signed)
Patient Name: Darius [name] Date of Encounter: 04/05/2014  Principal Problem:   Atrial fibrillation with rapid ventricular response Active Problems:   Obesity, Class III, BMI 40-49.9 (morbid obesity)   OSA (obstructive sleep apnea)   Acute systolic CHF (congestive heart failure), NYHA class 3   Primary Cardiologist: Dr. [name]  Patient Profile: 59 y/o male with history of PAF and medical noncompliance admitted with CHF (EF previously as low as 40%, was 60% in 2015) and atrial fibrillation w/ RVR.   SUBJECTIVE: Breathing better, no scales at home, admits to eating an entire box of chocolates the other night.  OBJECTIVE Filed Vitals:   04/04/14 2000 04/05/14 0000 04/05/14 0400 04/05/14 0747  BP: 114/76 118/60 122/64 141/79  Pulse: 94 107 98 68  Temp: 98.7 F (37.1 C) 98.9 F (37.2 C) 98.7 F (37.1 C) 98.3 F (36.8 C)  TempSrc: Oral Oral Oral Oral  Resp: 24 24  19   Height:      Weight:   346 lb 5.5 oz (157.1 kg)   SpO2: 94% 95% 94% 90%    Intake/Output Summary (Last 24 hours) at 04/05/14 1020 Last data filed at 04/05/14 6812  Gross per 24 hour  Intake    500 ml  Output   4025 ml  Net  -3525 ml   Filed Weights   04/03/14 0545 04/04/14 0500 04/05/14 0400  Weight: 348 lb 1.6 oz (157.897 kg) 347 lb 3.2 oz (157.489 kg) 346 lb 5.5 oz (157.1 kg)    PHYSICAL EXAM General: Well developed, well nourished, male in no acute distress. Head: Normocephalic, atraumatic.  Neck: Supple without bruits, JVD 10 cm Lungs:  Resp regular and unlabored, bibasilar rales. Heart: Irreg irreg, S1, S2, no S3, S4, or murmur; no rub. Abdomen: Soft, non-tender, non-distended, BS + x 4.  Extremities: No clubbing, cyanosis, 1-2+ edema.  Neuro: Alert and oriented X 3. Moves all extremities spontaneously. Psych: Normal affect.  LABS: CBC:  Recent Labs  04/03/14 0547 04/04/14 0353 04/05/14 0403  WBC 4.5 4.2 5.4  NEUTROABS 2.9  --   --   HGB 14.8 14.8 16.0  HCT 44.0 44.0 46.9  MCV  82.6 82.2 82.3  PLT 173 185 200   INR:No results for input(s): INR in the last 72 hours. Basic Metabolic Panel:  Recent Labs  04/03/14 0547 04/04/14 0353 04/05/14 0403  NA 132* 133* 130*  K 3.5 3.5 3.4*  CL 95* 96 95*  CO2 31 30 30   GLUCOSE 93 83 93  BUN 14 13 11   CREATININE 1.02 0.87 0.93  CALCIUM 7.7* 7.9* 7.9*  MG 1.9  --   --    Liver Function Tests:  Recent Labs  04/03/14 0547  AST 67*  ALT 57*  ALKPHOS 62  BILITOT 1.3*  PROT 6.0  ALBUMIN 3.0*   Cardiac Enzymes:  Recent Labs  04/03/14 0547 04/03/14 1155 04/03/14 1910  TROPONINI 0.04* 0.04* 0.04*    Recent Labs  04/02/14 2159  TROPIPOC 0.05   BNP:  B NATRIURETIC PEPTIDE  Date/Time Value Ref Range Status  04/02/2014 10:04 PM 144.3* 0.0 - 100.0 pg/mL Final   D-dimer:  Recent Labs  04/02/14 2338  DDIMER 0.89*   Hemoglobin A1C:  Recent Labs  04/02/14 1640  HGBA1C 6.6   Thyroid Function Tests:  Recent Labs  04/03/14 0600  TSH 1.238   TELE:  Atrial fib, rate generally controlled      ECHO: 04/04/2014  Study Conclusions - Left ventricle: The cavity  size was moderately dilated. Wall thickness was normal. Systolic function was mildly reduced. The estimated ejection fraction was in the range of 45% to 50%. - Left atrium: The atrium was severely dilated. 55 mm, 135 cc - Right atrium: The atrium was severely dilated.   Current Medications:  . amiodarone  200 mg Oral BID  . aspirin EC  81 mg Oral Daily  . benzonatate  100 mg Oral BID  . furosemide  40 mg Intravenous TID  . insulin aspart  0-20 Units Subcutaneous TID WC  . insulin aspart  3 Units Subcutaneous TID WC   . diltiazem (CARDIZEM) infusion 10 mg/hr (04/04/14 1516)  . heparin 1,900 Units/hr (04/04/14 0814)    ASSESSMENT AND PLAN: Principal Problem:   Atrial fibrillation with rapid ventricular response - Rate is controlled w/ Cardizem, but not the best drug w/ CHF/LVD - MD advise on changing to BB, will leave IV rx  for now. - with both RA/LA severely dilated, not sure we will be able to maintain SR, amio can help w/ rate control - TSH OK, LFTs mildly elevated, likely due to hepatic congestion, follow both  Active Problems:   Obesity, Class III, BMI 40-49.9 (morbid obesity) - poor compliance, poor diet (doesn't have a working refrigerator, eats out all the time) - doesn't want a nutrition consult, says he knows he needs to do better    OSA (obstructive sleep apnea) - says has machine at home, follows w/ Dr Gwenette Greet    Acute systolic CHF (congestive heart failure), NYHA class 3 - PAS 15, but still has LE edema and SOB off O2 - continue IV Lasix for now. - follow I/O, weights - CHF RN has seen, hopefully will help him get a scale    Hypokalemia - supp and follow    Anticoagulation - Case mgr to see if he qualifies for free Xarelto or Eliquis - otherwise, continue heparin>>coumadin, but compliance is doubtful - This patients CHA2DS2-VASc Score and unadjusted Ischemic Stroke Rate (% per year) is equal to 3.2 % stroke rate/year from a score of 3  Above score calculated as 1 point each if present [CHF, HTN, DM, Vascular=MI/PAD/Aortic Plaque, Age if 65-74, or Male] Above score calculated as 2 points each if present [Age > 75, or Stroke/TIA/TE]  Signed, Rosaria Ferries , PA-C 10:20 AM 04/05/2014   I have personally seen and examined this patient with Rosaria Ferries, PA-C. I agree with the assessment and plan as outlined above. He is diuresing well with IV Lasix. Renal function stable. Potassium is being replaced. Echo shows low normal LV dysfunction, LVEF=45-50%. Persistent atrial fib. Severely dilated atria on echo. Would likely not maintain sinus rhythm if we attempted cardioversion. Rate is well controlled currently. Will stop IV Cardizem and start Cardizem CD 240 mg per day. We could consider changing this to a beta blocker given mild LV systolic dysfunction. Still volume overloaded and needs  further diuresis with IV Lasix. His long term f/u will be with Dr. [name]. Will ask CHF team to see tomorrow to help define optimal therapy. Compliance is an issue.   Eleftherios Dudenhoeffer 04/05/2014 11:16 AM

## 2014-04-05 NOTE — Progress Notes (Signed)
ANTICOAGULATION CONSULT NOTE - Follow Up Consult  Pharmacy Consult for Heparin Indication: atrial fibrillation  No Known Allergies  Patient Measurements: Height: 6\' 2"  (188 cm) Weight: (!) 346 lb 5.5 oz (157.1 kg) IBW/kg (Calculated) : 82.2 Heparin Dosing Weight: 119.3 kg  Vital Signs: Temp: 98.3 F (36.8 C) (02/24 0747) Temp Source: Oral (02/24 0747) BP: 141/79 mmHg (02/24 0747) Pulse Rate: 68 (02/24 0747)  Labs:  Recent Labs  04/03/14 0547 04/03/14 1155  04/03/14 1910  04/04/14 0353 04/04/14 1256 04/05/14 0403  HGB 14.8  --   --   --   --  14.8  --  16.0  HCT 44.0  --   --   --   --  44.0  --  46.9  PLT 173  --   --   --   --  185  --  200  HEPARINUNFRC  --   --   < >  --   < > <0.10* 0.48 0.46  CREATININE 1.02  --   --   --   --  0.87  --  0.93  TROPONINI 0.04* 0.04*  --  0.04*  --   --   --   --   < > = values in this interval not displayed.  Estimated Creatinine Clearance: 134.1 mL/min (by C-G formula based on Cr of 0.93).   Assessment: 59 YO M who presented on 2/21 with SOB and was found to have recurrent Afib. The patient continues on heparin for anticoagulation while awaiting plans for TEE-DCCV later this week. Heparin level is therapeutic on 1900 units/hr. CBC wnl - no overt s/sx of bleeding noted  Goal of Therapy:  Heparin level 0.3-0.7 units/ml Monitor platelets by anticoagulation protocol: Yes   Plan:  Continue Heparin to 1900 units/hr  Will continue to monitor for any signs/symptoms of bleeding  Daily heparin level and CBC Follow up plans for oral anticoagulation   Courtez Twaddle, Pharm.D., BCPS Clinical Pharmacist Pager [phone] 04/05/2014 10:33 AM

## 2014-04-05 NOTE — Care Management Note (Addendum)
    Page 1 of 2   04/09/2014     12:20:01 PM CARE MANAGEMENT NOTE 04/09/2014  Patient:  Darius [name], Darius [name]   Account Number:  [id number]  Date Initiated:  04/05/2014  Documentation initiated by:  GRAVES-BIGELOW,BRENDA  Subjective/Objective Assessment:   Pt admitted for SOB. Pt is from home alone. HF Navigator did consult with pt. Home scale provided to pt. CM did call the [facility] to see if the insurance is active.     Action/Plan:   Benefits check submitted for xarelto and Eliquis. CM to speak with pt in regards to [facility] services and to see if home is safe to d/c back to. Pt asleep at time of visit. Will f/u.  PT consult placed.   Anticipated DC Date:  04/07/2014   Anticipated DC Plan:  [place]  CM consult  Patient refused services      Choice offered to / List presented to:          [facility] arranged  [place] - 11 Patient Refused      Status of service:  Completed, signed off Medicare Important Message given?  NO (If response is "NO", the following Medicare IM given date fields will be blank) Date Medicare IM given:   Medicare IM given by:   Date Additional Medicare IM given:   Additional Medicare IM given by:    Discharge Disposition:  HOME/SELF CARE  Per UR Regulation:  Reviewed for med. necessity/level of care/duration of stay  If discussed at [place] of Stay Meetings, dates discussed:    Comments:  04/09/14 12:15 CM met with pt to discuss [facility] services.  Pt adamantly refuses any home health services.  CM notified RN.  No other Cm needs were communicated.  Mariane Masters, BSN, Cm [phone].  04-06-14 [address], RN,BSN [phone] CM did provide pt with 30 day free and co pay card for eliquis. Pt will need Rx to go along with 30 day free card. Pt uses [facility] and medication is not available. Per pt he does not have a refrigerator at this time and states sister has offered to buy him one, he just has not done  so. Pt is refusing HHRN services at this time. Pt states he has a CPAP at home. May need 02 once stable for d/c.  FC was notified to see if insurance is active and it is. No further needs from CM at this time.   1255 04-05-13 Jacqlyn Krauss, RN,BSN [phone] PT COPAY WILL BE $50 PRIOR AUTH NOT REQUIRED FOR TIER 3 XARELTO. ELIQUIS TIER 4 COPAY WILL BE $70 PRIOR AUTH IS NOT REQUIRED.

## 2014-04-06 LAB — GLUCOSE, CAPILLARY
GLUCOSE-CAPILLARY: 95 mg/dL (ref 70–99)
Glucose-Capillary: 148 mg/dL — ABNORMAL HIGH (ref 70–99)
Glucose-Capillary: 90 mg/dL (ref 70–99)
Glucose-Capillary: 89 mg/dL (ref 70–99)

## 2014-04-06 LAB — BASIC METABOLIC PANEL
Anion gap: 13 (ref 5–15)
BUN: 11 mg/dL (ref 6–23)
CO2: 25 mmol/L (ref 19–32)
Calcium: 8.3 mg/dL — ABNORMAL LOW (ref 8.4–10.5)
Chloride: 96 mmol/L (ref 96–112)
Creatinine, Ser: 0.77 mg/dL (ref 0.50–1.35)
GFR calc Af Amer: >90 mL/min (ref >90)
GFR calc non Af Amer: >90 mL/min (ref >90)
Glucose, Bld: 77 mg/dL (ref 70–99)
Potassium: 3.6 mmol/L (ref 3.5–5.1)
Sodium: 134 mmol/L — ABNORMAL LOW (ref 135–145)

## 2014-04-06 LAB — CBC
HCT: 47.7 % (ref 39.0–52.0)
Hemoglobin: 16.2 g/dL (ref 13.0–17.0)
MCH: 28.6 pg (ref 26.0–34.0)
MCHC: 34 g/dL (ref 30.0–36.0)
MCV: 84.3 fL (ref 78.0–100.0)
PLATELETS: 179 10*3/uL (ref 150–400)
RBC: 5.66 MIL/uL (ref 4.22–5.81)
RDW: 14.1 % (ref 11.5–15.5)
WBC: 5.1 10*3/uL (ref 4.0–10.5)

## 2014-04-06 LAB — HEPARIN LEVEL (UNFRACTIONATED): Heparin Unfractionated: 0.51 IU/mL (ref 0.30–0.70)

## 2014-04-06 MED ORDER — AMIODARONE HCL 200 MG PO TABS
400.0000 mg | ORAL_TABLET | Freq: Two times a day (BID) | ORAL | Status: DC
  Administered 2014-04-06 – 2014-04-09 (×6): 400 mg via ORAL
  Filled 2014-04-06 (×6): qty 2

## 2014-04-06 MED ORDER — APIXABAN 5 MG PO TABS
5.0000 mg | ORAL_TABLET | Freq: Two times a day (BID) | ORAL | Status: DC
  Administered 2014-04-06 – 2014-04-09 (×7): 5 mg via ORAL
  Filled 2014-04-06 (×3): qty 1
  Filled 2014-04-06 (×2): qty 2
  Filled 2014-04-06 (×3): qty 1

## 2014-04-06 MED ORDER — METOPROLOL SUCCINATE ER 25 MG PO TB24
25.0000 mg | ORAL_TABLET | Freq: Two times a day (BID) | ORAL | Status: DC
  Administered 2014-04-06 – 2014-04-09 (×6): 25 mg via ORAL
  Filled 2014-04-06 (×6): qty 1

## 2014-04-06 NOTE — Progress Notes (Addendum)
ANTICOAGULATION CONSULT NOTE - Follow Up Consult  Pharmacy Consult for Heparin Indication: atrial fibrillation  No Known Allergies  Patient Measurements: Height: 6' (182.9 cm) Weight: (!) 336 lb 8 oz (152.635 kg) IBW/kg (Calculated) : 77.6 Heparin Dosing Weight: 119.3 kg  Vital Signs: Temp: 98.5 F (36.9 C) (02/25 0400) Temp Source: Oral (02/25 0400) BP: 109/75 mmHg (02/25 0400) Pulse Rate: 43 (02/25 0400)  Labs:  Recent Labs  04/03/14 1155  04/03/14 1910  04/04/14 0353 04/04/14 1256 04/05/14 0403 04/06/14 0522  HGB  --   --   --   < > 14.8  --  16.0 16.2  HCT  --   --   --   --  44.0  --  46.9 47.7  PLT  --   --   --   --  185  --  200 179  HEPARINUNFRC  --   < >  --   < > <0.10* 0.48 0.46 0.51  CREATININE  --   --   --   --  0.87  --  0.93 0.77  TROPONINI 0.04*  --  0.04*  --   --   --   --   --   < > = values in this interval not displayed.  Estimated Creatinine Clearance: 149.4 mL/min (by C-G formula based on Cr of 0.77).   Assessment: 59 YO M who presented on 2/21 with SOB and was found to have recurrent Afib. The patient continues on heparin for anticoagulation while awaiting plans for TEE-DCCV later this week. Heparin level is therapeutic on 1900 units/hr. CBC wnl - no overt s/sx of bleeding noted  Goal of Therapy:  Heparin level 0.3-0.7 units/ml Monitor platelets by anticoagulation protocol: Yes   Plan:  Continue Heparin to 1900 units/hr  Will continue to monitor for any signs/symptoms of bleeding  Daily heparin level and CBC Follow up plans for oral anticoagulation   Mosella Kasa, Pharm.D., BCPS Clinical Pharmacist Pager [phone] 04/06/2014 10:20 AM    New orders to transition patient from Heparin to Apixaban for afib. Age < 65, Weight >60, SCr <1.5 Start Apixaban 5mg  PO BID - first dose now. Follow up with Apixaban education.  [employer], Pharm.D., BCPS Clinical Pharmacist Pager [phone] 04/06/2014 10:49 AM

## 2014-04-06 NOTE — Progress Notes (Signed)
Patient ID: Darius [name], male   DOB: 28-Apr-1955, 59 y.o.   MRN: [id number]   SUBJECTIVE: Patient walked today with PT, oxygen saturation dropped into 80s on room air.  He is mildly dyspneic with ambulation.  He remains in atrial fibrillation but rate is controlled. Good UOP on IV Lasix, weight coming down.   Echo: EF 45-50%, moderate LV dilation, severe biatrial enlargement   Filed Vitals:   04/05/14 1554 04/05/14 2000 04/06/14 0000 04/06/14 0400  BP: 110/74 113/72 99/71 109/75  Pulse: 106 79 87 43  Temp: 98.9 F (37.2 C) 97.8 F (36.6 C) 98.3 F (36.8 C) 98.5 F (36.9 C)  TempSrc: Oral Oral Oral Oral  Resp: 17     Height:    6' (1.829 m)  Weight:    336 lb 8 oz (152.635 kg)  SpO2: 90% 94% 96% 94%    Intake/Output Summary (Last 24 hours) at 04/06/14 1042 Last data filed at 04/06/14 1020  Gross per 24 hour  Intake 1790.3 ml  Output   3875 ml  Net -2084.7 ml    LABS: Basic Metabolic Panel:  Recent Labs  04/05/14 0403 04/06/14 0522  NA 130* 134*  K 3.4* 3.6  CL 95* 96  CO2 30 25  GLUCOSE 93 77  BUN 11 11  CREATININE 0.93 0.77  CALCIUM 7.9* 8.3*   Liver Function Tests: No results for input(s): AST, ALT, ALKPHOS, BILITOT, PROT, ALBUMIN in the last 72 hours. No results for input(s): LIPASE, AMYLASE in the last 72 hours. CBC:  Recent Labs  04/05/14 0403 04/06/14 0522  WBC 5.4 5.1  HGB 16.0 16.2  HCT 46.9 47.7  MCV 82.3 84.3  PLT 200 179   Cardiac Enzymes:  Recent Labs  04/03/14 1155 04/03/14 1910  TROPONINI 0.04* 0.04*   BNP: Invalid input(s): POCBNP D-Dimer: No results for input(s): DDIMER in the last 72 hours. Hemoglobin A1C: No results for input(s): HGBA1C in the last 72 hours. Fasting Lipid Panel: No results for input(s): CHOL, HDL, LDLCALC, TRIG, CHOLHDL, LDLDIRECT in the last 72 hours. Thyroid Function Tests: No results for input(s): TSH, T4TOTAL, T3FREE, THYROIDAB in the last 72 hours.  Invalid input(s): FREET3 Anemia Panel: No  results for input(s): VITAMINB12, FOLATE, FERRITIN, TIBC, IRON, RETICCTPCT in the last 72 hours.  RADIOLOGY: Dg Chest 2 View  04/02/2014   CLINICAL DATA:  Shortness of breath for 3 days. History of diabetes, hypertension, heart disease.  EXAM: CHEST  2 VIEW  COMPARISON:  03/30/2011.  FINDINGS: Cardiac enlargement. Mild vascular congestion. No edema or consolidation. No blunting of costophrenic angles. No pneumothorax. Tortuous aorta. Degenerative changes in the spine.  IMPRESSION: Cardiac enlargement with mild pulmonary vascular congestion. No edema or consolidation.   Electronically Signed   By: Lucienne Capers M.D.   On: 04/02/2014 23:02   Ct Angio Chest Pe W/cm &/or Wo Cm  04/03/2014   CLINICAL DATA:  Shortness of breath, nonproductive cough.  EXAM: CT ANGIOGRAPHY CHEST WITH CONTRAST  TECHNIQUE: Multidetector CT imaging of the chest was performed using the standard protocol during bolus administration of intravenous contrast. Multiplanar CT image reconstructions and MIPs were obtained to evaluate the vascular anatomy.  CONTRAST:  68mL OMNIPAQUE IOHEXOL 350 MG/ML SOLN  COMPARISON:  Chest radiograph earlier this date.  FINDINGS: There are no filling defects within the pulmonary arteries to suggest pulmonary embolus. There is multi chamber cardiomegaly. The thoracic aorta is normal in caliber. Small left pleural effusion. There is fluid in the superior pericardial recess without  pericardial effusion. Shotty mediastinal and hilar lymphadenopathy without pathologically enlarged lymph nodes.  Ill-defined perihilar ground-glass and reticulonodular opacities, left greater than right. Within the periphery of the anterior left upper lobe is a 11 x 12 x 10 mm slightly more confluent nodular density. No confluent airspace disease to suggest lobar pneumonia.  Evaluation of the upper abdomen demonstrates hepatic steatosis. Calcification in the gallbladder, may reflect wall calcification versus stone.  There are no  acute or suspicious osseous abnormalities.  Review of the MIP images confirms the above findings.  IMPRESSION: 1. No pulmonary embolus. 2. Perihilar ground-glass reticulonodular opacities, left greater than right. This may reflect pulmonary edema, inflammatory versus less likely infection. Slightly more confluent nodule in the periphery of the left upper lobe measures 11 x 12 x 10 mm, this is likely related to the underlying diffuse process, however follow-up chest CT recommended in 3 months to evaluate for imaging stability resolution. There is cardiomegaly and a small left pleural effusion, findings may reflect CHF.   Electronically Signed   By: Jeb Levering M.D.   On: 04/03/2014 03:08    PHYSICAL EXAM General: NAD Neck: JVP difficult but appears elevated, no thyromegaly or thyroid nodule.  Lungs: Clear to auscultation bilaterally with normal respiratory effort. CV: Nondisplaced PMI.  Heart irregular S1/S2, no S3/S4, no murmur.  2+ edema to knees bilaterally.   Abdomen: Soft, nontender, no hepatosplenomegaly, no distention.  Neurologic: Alert and oriented x 3.  Psych: Normal affect. Extremities: No clubbing or cyanosis.   TELEMETRY: Reviewed telemetry pt in atrial fibrillation, rate 80s  ASSESSMENT AND PLAN: 59 yo with history of paroxysmal atrial fibrillation, severe OSA, and primarily diastolic CHF presented with atrial fibrillation/RVR and acute on chronic diastolic CHF.  1. Atrial fibrillation: Paroxysmal. He had a TEE-DCCV back in 2013.  He has been in and out of atrial fibrillation since then.  He has not been on an antiarrhythmic.  He has severe biatrial enlargement, so unlikely to hold NSR long-term without anti-arrhythmic and may be unlikely even with anti-arrhythmic.  Given symptoms with CHF exacerbation, would be reasonable to try an attempt at maintaining NSR using amiodarone.  - Increase amiodarone to 400 mg bid.  - Stop heparin, start Eliquis 5 mg bid.   - If he remains in  hospital on Monday, can do TEE-guided DCCV.  If not, can bring him back after 1 month of Eliquis for without TEE.  - Stop diltiazem CD given mildly decreased EF.  Will use Toprol XL 25 mg bid.  2. Acute on chronic diastolic CHF: EF 81-44% on echo this admission. Patient remains volume overloaded on exam.  Exacerbation likely triggered by atrial fibrillation/RVR.  He is diuresing well.  - Continue Lasix 40 mg IV every 8 hrs.  - Strict I/Os.  3. Severe OSA, suspect OHS: CPAP at night, may need oxygen during the day when he goes home.   Loralie Champagne 04/06/2014 10:49 AM

## 2014-04-06 NOTE — Progress Notes (Signed)
SATURATION QUALIFICATIONS: (This note is used to comply with regulatory documentation for home oxygen)  Patient Saturations on Room Air at Rest = 93%  Patient Saturations on Room Air while Ambulating = 87-92%  Patient Saturations on 2 Liters of oxygen while Ambulating = 90-93%  Please briefly explain why patient needs home oxygen:Pt desat on RA with ambulation. Able to keep sats >90% with 2LO2.  Also encouraged pursed lip breathing.  DOE3/4.  Thanks. [place] [phone] (pager)

## 2014-04-06 NOTE — Evaluation (Signed)
Physical Therapy Evaluation Patient Details Name: Darius [name] MRN: [id number] DOB: Jun 12, 1955 Today's Date: 04/06/2014   History of Present Illness  Pt admit with afib with RVR.  Bil LE edema as well with SOB.    Clinical Impression  Pt admitted with above diagnosis. Pt currently with functional limitations due to the deficits listed below (see PT Problem List). Pt able to ambulate well.  Desat without O2 and needing O2 at present to keep sats >90%.  May need Home O2. Should not need home PT. Will follow acutely.   Pt will benefit from skilled PT to increase their independence and safety with mobility to allow discharge to the venue listed below.      Follow Up Recommendations  (possibly HHRN to monitor sats)    Equipment Recommendations  Other (comment) (possibly home O2)    Recommendations for Other Services       Precautions / Restrictions Precautions Precautions: Fall Restrictions Weight Bearing Restrictions: No      Mobility  Bed Mobility Overal bed mobility: Independent                Transfers Overall transfer level: Independent                  Ambulation/Gait Ambulation/Gait assistance: Supervision;Min guard Ambulation Distance (Feet): 500 Feet Assistive device: None Gait Pattern/deviations: Step-through pattern;Decreased stride length;Wide base of support   Gait velocity interpretation: at or above normal speed for age/gender General Gait Details: Pt ambulating with good balance overall.  Desat with activity.  See below.    Stairs            Wheelchair Mobility    Modified Rankin (Stroke Patients Only)       Balance Overall balance assessment: Needs assistance         Standing balance support: No upper extremity supported;During functional activity Standing balance-Leahy Scale: Fair Standing balance comment: can stand statically without UE support.                              Pertinent Vitals/Pain Pain  Assessment: No/denies pain    SATURATION QUALIFICATIONS: (This note is used to comply with regulatory documentation for home oxygen)  Patient Saturations on Room Air at Rest = 93%  Patient Saturations on Room Air while Ambulating = 87-92%  Patient Saturations on 2 Liters of oxygen while Ambulating = 90-93%  Please briefly explain why patient needs home oxygen:Pt desat on RA with ambulation. Able to keep sats >90% with 2LO2.  Also encouraged pursed lip breathing.  DOE3/4.  Home Living Family/patient expects to be discharged to:: Private residence Living Arrangements: Alone   Type of Home: House Home Access: Stairs to enter Entrance Stairs-Rails: Right;Left;Can reach both Technical brewer of Steps: 4 Home Layout: One level Home Equipment: Wheelchair - manual;Cane - single point (CPAP)      Prior Function Level of Independence: Independent               Hand Dominance        Extremity/Trunk Assessment   Upper Extremity Assessment: Defer to OT evaluation           Lower Extremity Assessment: Generalized weakness      Cervical / Trunk Assessment: Normal  Communication   Communication: No difficulties  Cognition Arousal/Alertness: Awake/alert Behavior During Therapy: WFL for tasks assessed/performed Overall Cognitive Status: Within Functional Limits for tasks assessed  General Comments      Exercises General Exercises - Lower Extremity Ankle Circles/Pumps: AROM;Both;10 reps;Seated Long Arc Quad: AROM;Both;10 reps;Seated      Assessment/Plan    PT Assessment Patient needs continued PT services  PT Diagnosis Generalized weakness;Other (comment) (decr endurance)   PT Problem List Decreased activity tolerance;Decreased mobility;Decreased knowledge of use of DME;Decreased safety awareness;Decreased knowledge of precautions  PT Treatment Interventions DME instruction;Gait training;Functional mobility training;Therapeutic  activities;Therapeutic exercise;Balance training;Stair training;Patient/family education   PT Goals (Current goals can be found in the Care Plan section) Acute Rehab PT Goals Patient Stated Goal: to get better PT Goal Formulation: With patient Time For Goal Achievement: 04/13/14 Potential to Achieve Goals: Good    Frequency Min 3X/week   Barriers to discharge Decreased caregiver support      Co-evaluation               End of Session Equipment Utilized During Treatment: Gait belt;Oxygen Activity Tolerance: Patient limited by fatigue Patient left: with call bell/phone within reach;in bed Nurse Communication: Mobility status         Time: 1638-4665 PT Time Calculation (min) (ACUTE ONLY): 18 min   Charges:   PT Evaluation $Initial PT Evaluation Tier I: 1 Procedure     PT G CodesDenice Paradise 2014-04-27, 1:52 PM [facility] Acute Rehabilitation [phone] (pager)

## 2014-04-07 LAB — GLUCOSE, CAPILLARY
GLUCOSE-CAPILLARY: 90 mg/dL (ref 70–99)
GLUCOSE-CAPILLARY: 96 mg/dL (ref 70–99)
GLUCOSE-CAPILLARY: 148 mg/dL — AB (ref 70–99)
Glucose-Capillary: 107 mg/dL — ABNORMAL HIGH (ref 70–99)
Glucose-Capillary: 81 mg/dL (ref 70–99)

## 2014-04-07 LAB — CBC
HEMATOCRIT: 49.8 % (ref 39.0–52.0)
Hemoglobin: 16.5 g/dL (ref 13.0–17.0)
MCH: 27.7 pg (ref 26.0–34.0)
MCHC: 33.1 g/dL (ref 30.0–36.0)
MCV: 83.6 fL (ref 78.0–100.0)
Platelets: 216 10*3/uL (ref 150–400)
RBC: 5.96 MIL/uL — ABNORMAL HIGH (ref 4.22–5.81)
RDW: 14 % (ref 11.5–15.5)
WBC: 6.7 10*3/uL (ref 4.0–10.5)

## 2014-04-07 LAB — BASIC METABOLIC PANEL
ANION GAP: 9 (ref 5–15)
BUN: 11 mg/dL (ref 6–23)
CHLORIDE: 98 mmol/L (ref 96–112)
CO2: 27 mmol/L (ref 19–32)
Calcium: 8.7 mg/dL (ref 8.4–10.5)
Creatinine, Ser: 0.95 mg/dL (ref 0.50–1.35)
GFR calc Af Amer: >90 mL/min (ref >90)
GFR calc non Af Amer: 89 mL/min — ABNORMAL LOW (ref >90)
Glucose, Bld: 79 mg/dL (ref 70–99)
Potassium: 4.2 mmol/L (ref 3.5–5.1)
Sodium: 134 mmol/L — ABNORMAL LOW (ref 135–145)

## 2014-04-07 NOTE — Progress Notes (Signed)
Physical Therapy Treatment Patient Details Name: Darius [name] MRN: [id number] DOB: [birth year] Today's Date: 04/07/2014    History of Present Illness Pt admit with afib with RVR.  Bil LE edema as well with SOB.      PT Comments    Pt admitted with above diagnosis. Pt currently with functional limitations due to balance and endurance deficits.  Pt did not desat today below 90% with a short walk.  Improving daily.  Session cut short due to visitor arriving.   Pt will benefit from skilled PT to increase their independence and safety with mobility to allow discharge to the venue listed below.    Follow Up Recommendations   (possibly HHRN to monitor sats)     Equipment Recommendations  Other (comment) (possibly home O2)    Recommendations for Other Services       Precautions / Restrictions Precautions Precautions: Fall Restrictions Weight Bearing Restrictions: No    Mobility  Bed Mobility Overal bed mobility: Independent                Transfers Overall transfer level: Independent               General transfer comment: Pt got on scales to weigh on way back to room.    Ambulation/Gait Ambulation/Gait assistance: Independent Ambulation Distance (Feet): 250 Feet Assistive device: None Gait Pattern/deviations: Step-through pattern;Decreased stride length;Wide base of support   Gait velocity interpretation: at or above normal speed for age/gender General Gait Details: Pt ambulating with good balance overall. did not desat with activity however walk cut short by visitor arriving and pt declined continued ambulation.  O2 sats on RA were 90% and >.       Stairs            Wheelchair Mobility    Modified Rankin (Stroke Patients Only)       Balance Overall balance assessment: Needs assistance         Standing balance support: No upper extremity supported;During functional activity Standing balance-Leahy Scale: Fair Standing balance comment: can  stand statically without UE support.                      Cognition Arousal/Alertness: Awake/alert Behavior During Therapy: WFL for tasks assessed/performed Overall Cognitive Status: Within Functional Limits for tasks assessed                      Exercises General Exercises - Lower Extremity Ankle Circles/Pumps: AROM;Both;10 reps;Seated Long Arc Quad: AROM;Both;10 reps;Seated    General Comments        Pertinent Vitals/Pain Pain Assessment: No/denies pain  O2 sat on RA 90-92% with activity.  Replaced O2 on 2L after walk with sat 90-94%.      Home Living                      Prior Function            PT Goals (current goals can now be found in the care plan section) Progress towards PT goals: Progressing toward goals    Frequency  Min 3X/week    PT Plan Current plan remains appropriate    Co-evaluation             End of Session Equipment Utilized During Treatment: Gait belt;Oxygen Activity Tolerance: Patient limited by fatigue Patient left: in chair;with call bell/phone within reach;with family/visitor present     Time: 6720-9470 PT Time Calculation (min) (ACUTE  ONLY): 8 min  Charges:  $Gait Training: 8-22 mins                    G Codes:      Denice Paradise May 02, 2014, 5:00 PM [facility] Acute Rehabilitation [phone] (pager)

## 2014-04-07 NOTE — Progress Notes (Signed)
Patient ID: Darius [name], male   DOB: [birth year], 59 y.o.   MRN: [id number]   SUBJECTIVE: Patient remains in atrial fibrillation, rate is well-controlled now.  He is diuresing well on IV Lasix.  Weight down.  Legs still swollen.   Echo: EF 45-50%, moderate LV dilation, severe biatrial enlargement  Scheduled Meds: . amiodarone  400 mg Oral BID  . apixaban  5 mg Oral BID  . benzonatate  100 mg Oral BID  . furosemide  40 mg Intravenous TID  . insulin aspart  0-20 Units Subcutaneous TID WC  . insulin aspart  3 Units Subcutaneous TID WC  . metoprolol succinate  25 mg Oral BID  . potassium chloride  40 mEq Oral Daily   Continuous Infusions:  PRN Meds:.acetaminophen, ondansetron (ZOFRAN) IV, zolpidem    Filed Vitals:   04/06/14 2000 04/06/14 2100 04/06/14 2220 04/07/14 0400  BP: 95/61 113/65 115/75 123/68  Pulse: 76   67  Temp: 97.9 F (36.6 C)   97.8 F (36.6 C)  TempSrc: Oral   Oral  Resp:      Height:    6' (1.829 m)  Weight:    323 lb 4.8 oz (146.648 kg)  SpO2: 96%   95%    Intake/Output Summary (Last 24 hours) at 04/07/14 0804 Last data filed at 04/07/14 0536  Gross per 24 hour  Intake   1430 ml  Output   4175 ml  Net  -2745 ml    LABS: Basic Metabolic Panel:  Recent Labs  04/06/14 0522 04/07/14 0351  NA 134* 134*  K 3.6 4.2  CL 96 98  CO2 25 27  GLUCOSE 77 79  BUN 11 11  CREATININE 0.77 0.95  CALCIUM 8.3* 8.7   Liver Function Tests: No results for input(s): AST, ALT, ALKPHOS, BILITOT, PROT, ALBUMIN in the last 72 hours. No results for input(s): LIPASE, AMYLASE in the last 72 hours. CBC:  Recent Labs  04/06/14 0522 04/07/14 0351  WBC 5.1 6.7  HGB 16.2 16.5  HCT 47.7 49.8  MCV 84.3 83.6  PLT 179 216   Cardiac Enzymes: No results for input(s): CKTOTAL, CKMB, CKMBINDEX, TROPONINI in the last 72 hours. BNP: Invalid input(s): POCBNP D-Dimer: No results for input(s): DDIMER in the last 72 hours. Hemoglobin A1C: No results for input(s): HGBA1C  in the last 72 hours. Fasting Lipid Panel: No results for input(s): CHOL, HDL, LDLCALC, TRIG, CHOLHDL, LDLDIRECT in the last 72 hours. Thyroid Function Tests: No results for input(s): TSH, T4TOTAL, T3FREE, THYROIDAB in the last 72 hours.  Invalid input(s): FREET3 Anemia Panel: No results for input(s): VITAMINB12, FOLATE, FERRITIN, TIBC, IRON, RETICCTPCT in the last 72 hours.  RADIOLOGY: Dg Chest 2 View  04/02/2014   CLINICAL DATA:  Shortness of breath for 3 days. History of diabetes, hypertension, heart disease.  EXAM: CHEST  2 VIEW  COMPARISON:  03/30/2011.  FINDINGS: Cardiac enlargement. Mild vascular congestion. No edema or consolidation. No blunting of costophrenic angles. No pneumothorax. Tortuous aorta. Degenerative changes in the spine.  IMPRESSION: Cardiac enlargement with mild pulmonary vascular congestion. No edema or consolidation.   Electronically Signed   By: Lucienne Capers M.D.   On: 04/02/2014 23:02   Ct Angio Chest Pe W/cm &/or Wo Cm  04/03/2014   CLINICAL DATA:  Shortness of breath, nonproductive cough.  EXAM: CT ANGIOGRAPHY CHEST WITH CONTRAST  TECHNIQUE: Multidetector CT imaging of the chest was performed using the standard protocol during bolus administration of intravenous contrast. Multiplanar CT image reconstructions  and MIPs were obtained to evaluate the vascular anatomy.  CONTRAST:  5mL OMNIPAQUE IOHEXOL 350 MG/ML SOLN  COMPARISON:  Chest radiograph earlier this date.  FINDINGS: There are no filling defects within the pulmonary arteries to suggest pulmonary embolus. There is multi chamber cardiomegaly. The thoracic aorta is normal in caliber. Small left pleural effusion. There is fluid in the superior pericardial recess without pericardial effusion. Shotty mediastinal and hilar lymphadenopathy without pathologically enlarged lymph nodes.  Ill-defined perihilar ground-glass and reticulonodular opacities, left greater than right. Within the periphery of the anterior left  upper lobe is a 11 x 12 x 10 mm slightly more confluent nodular density. No confluent airspace disease to suggest lobar pneumonia.  Evaluation of the upper abdomen demonstrates hepatic steatosis. Calcification in the gallbladder, may reflect wall calcification versus stone.  There are no acute or suspicious osseous abnormalities.  Review of the MIP images confirms the above findings.  IMPRESSION: 1. No pulmonary embolus. 2. Perihilar ground-glass reticulonodular opacities, left greater than right. This may reflect pulmonary edema, inflammatory versus less likely infection. Slightly more confluent nodule in the periphery of the left upper lobe measures 11 x 12 x 10 mm, this is likely related to the underlying diffuse process, however follow-up chest CT recommended in 3 months to evaluate for imaging stability resolution. There is cardiomegaly and a small left pleural effusion, findings may reflect CHF.   Electronically Signed   By: Jeb Levering M.D.   On: 04/03/2014 03:08    PHYSICAL EXAM General: NAD Neck: JVP difficult but appears elevated, no thyromegaly or thyroid nodule.  Lungs: Clear to auscultation bilaterally with normal respiratory effort. CV: Nondisplaced PMI.  Heart irregular S1/S2, no S3/S4, no murmur.  2+ edema 3/4 to knees bilaterally.   Abdomen: Soft, nontender, no hepatosplenomegaly, no distention.  Neurologic: Alert and oriented x 3.  Psych: Normal affect. Extremities: No clubbing or cyanosis.   TELEMETRY: Reviewed telemetry pt in atrial fibrillation, rate 70s  ASSESSMENT AND PLAN: 59 yo with history of paroxysmal atrial fibrillation, severe OSA, and primarily diastolic CHF presented with atrial fibrillation/RVR and acute on chronic diastolic CHF.  1. Atrial fibrillation: Paroxysmal. He had a TEE-DCCV back in 2013.  He has been in and out of atrial fibrillation since then.  He has not been on an antiarrhythmic.  He has severe biatrial enlargement, so unlikely to hold NSR  long-term without anti-arrhythmic and may be unlikely even with anti-arrhythmic.  Given symptoms with CHF exacerbation, would be reasonable to try an attempt at maintaining NSR using amiodarone.  - Continue amiodarone 400 mg bid. - Eliquis started on 2/25.    - If he remains in hospital on Monday, he will need to have TEE-guided DCCV arranged for Monday.  If he goes home, can bring him back after 1 month of Eliquis for DCCV without TEE.  - Continue current Toprol XL.   2. Acute on chronic diastolic CHF: EF 50-93% on echo this admission. Patient remains volume overloaded on exam.  Exacerbation likely triggered by atrial fibrillation/RVR.  He is diuresing well.  - Continue Lasix 40 mg IV every 8 hrs, he is doing well on this regimen.  - Strict I/Os.  3. Severe OSA, suspect OHS: CPAP at night, may need oxygen during the day when he goes home.   Loralie Champagne 04/07/2014 8:04 AM

## 2014-04-08 DIAGNOSIS — G4733 Obstructive sleep apnea (adult) (pediatric): Secondary | ICD-10-CM

## 2014-04-08 LAB — BASIC METABOLIC PANEL
Anion gap: 7 (ref 5–15)
BUN: 13 mg/dL (ref 6–23)
CHLORIDE: 101 mmol/L (ref 96–112)
CO2: 27 mmol/L (ref 19–32)
CREATININE: 0.86 mg/dL (ref 0.50–1.35)
Calcium: 8.6 mg/dL (ref 8.4–10.5)
GFR calc Af Amer: >90 mL/min (ref >90)
GFR calc non Af Amer: >90 mL/min (ref >90)
GLUCOSE: 90 mg/dL (ref 70–99)
Potassium: 4.2 mmol/L (ref 3.5–5.1)
SODIUM: 135 mmol/L (ref 135–145)

## 2014-04-08 LAB — GLUCOSE, CAPILLARY
Glucose-Capillary: 154 mg/dL — ABNORMAL HIGH (ref 70–99)
Glucose-Capillary: 93 mg/dL (ref 70–99)

## 2014-04-08 LAB — CBC
HEMATOCRIT: 50.9 % (ref 39.0–52.0)
HEMOGLOBIN: 17.4 g/dL — AB (ref 13.0–17.0)
MCH: 28.9 pg (ref 26.0–34.0)
MCHC: 34.2 g/dL (ref 30.0–36.0)
MCV: 84.6 fL (ref 78.0–100.0)
PLATELETS: 240 10*3/uL (ref 150–400)
RBC: 6.02 MIL/uL — AB (ref 4.22–5.81)
RDW: 14.1 % (ref 11.5–15.5)
WBC: 5.5 10*3/uL (ref 4.0–10.5)

## 2014-04-08 NOTE — Progress Notes (Signed)
Patient ID: Darius [name], male   DOB: [birth year], 59 y.o.   MRN: [id number]   SUBJECTIVE: Patient remains in atrial fibrillation, rate is well-controlled now.  He is diuresing well on IV Lasix.  Weight down.  Legs still swollen but improved.  WT: 328 from 348 "never been that low"  Echo: EF 45-50%, moderate LV dilation, severe biatrial enlargement  Scheduled Meds: . amiodarone  400 mg Oral BID  . apixaban  5 mg Oral BID  . benzonatate  100 mg Oral BID  . furosemide  40 mg Intravenous TID  . insulin aspart  0-20 Units Subcutaneous TID WC  . insulin aspart  3 Units Subcutaneous TID WC  . metoprolol succinate  25 mg Oral BID  . potassium chloride  40 mEq Oral Daily   Continuous Infusions:  PRN Meds:.acetaminophen, ondansetron (ZOFRAN) IV, zolpidem    Filed Vitals:   04/07/14 2000 04/07/14 2243 04/07/14 2304 04/08/14 0500  BP: 108/54 129/82 129/74 129/73  Pulse: 84   77  Temp: 97.9 F (36.6 C)   98.6 F (37 C)  TempSrc: Oral   Oral  Resp:      Height:    6' (1.829 m)  Weight:    328 lb 3.2 oz (148.871 kg)  SpO2: 96%   97%    Intake/Output Summary (Last 24 hours) at 04/08/14 0924 Last data filed at 04/08/14 0556  Gross per 24 hour  Intake    960 ml  Output   2425 ml  Net  -1465 ml    LABS: Basic Metabolic Panel:  Recent Labs  04/07/14 0351 04/08/14 0440  NA 134* 135  K 4.2 4.2  CL 98 101  CO2 27 27  GLUCOSE 79 90  BUN 11 13  CREATININE 0.95 0.86  CALCIUM 8.7 8.6     Recent Labs  04/07/14 0351 04/08/14 0440  WBC 6.7 5.5  HGB 16.5 17.4*  HCT 49.8 50.9  MCV 83.6 84.6  PLT 216 240   RADIOLOGY: Dg Chest 2 View  04/02/2014   CLINICAL DATA:  Shortness of breath for 3 days. History of diabetes, hypertension, heart disease.  EXAM: CHEST  2 VIEW  COMPARISON:  03/30/2011.  FINDINGS: Cardiac enlargement. Mild vascular congestion. No edema or consolidation. No blunting of costophrenic angles. No pneumothorax. Tortuous aorta. Degenerative changes in the spine.   IMPRESSION: Cardiac enlargement with mild pulmonary vascular congestion. No edema or consolidation.   Electronically Signed   By: Lucienne Capers M.D.   On: 04/02/2014 23:02   Ct Angio Chest Pe W/cm &/or Wo Cm  04/03/2014   CLINICAL DATA:  Shortness of breath, nonproductive cough.  EXAM: CT ANGIOGRAPHY CHEST WITH CONTRAST  TECHNIQUE: Multidetector CT imaging of the chest was performed using the standard protocol during bolus administration of intravenous contrast. Multiplanar CT image reconstructions and MIPs were obtained to evaluate the vascular anatomy.  CONTRAST:  59mL OMNIPAQUE IOHEXOL 350 MG/ML SOLN  COMPARISON:  Chest radiograph earlier this date.  FINDINGS: There are no filling defects within the pulmonary arteries to suggest pulmonary embolus. There is multi chamber cardiomegaly. The thoracic aorta is normal in caliber. Small left pleural effusion. There is fluid in the superior pericardial recess without pericardial effusion. Shotty mediastinal and hilar lymphadenopathy without pathologically enlarged lymph nodes.  Ill-defined perihilar ground-glass and reticulonodular opacities, left greater than right. Within the periphery of the anterior left upper lobe is a 11 x 12 x 10 mm slightly more confluent nodular density. No confluent airspace disease to  suggest lobar pneumonia.  Evaluation of the upper abdomen demonstrates hepatic steatosis. Calcification in the gallbladder, may reflect wall calcification versus stone.  There are no acute or suspicious osseous abnormalities.  Review of the MIP images confirms the above findings.  IMPRESSION: 1. No pulmonary embolus. 2. Perihilar ground-glass reticulonodular opacities, left greater than right. This may reflect pulmonary edema, inflammatory versus less likely infection. Slightly more confluent nodule in the periphery of the left upper lobe measures 11 x 12 x 10 mm, this is likely related to the underlying diffuse process, however follow-up chest CT  recommended in 3 months to evaluate for imaging stability resolution. There is cardiomegaly and a small left pleural effusion, findings may reflect CHF.   Electronically Signed   By: Jeb Levering M.D.   On: 04/03/2014 03:08    PHYSICAL EXAM General: NAD Neck: JVP difficult but appears elevated, no thyromegaly or thyroid nodule.  Lungs: Clear to auscultation bilaterally with normal respiratory effort. CV: Nondisplaced PMI.  Heart irregular S1/S2, no S3/S4, no murmur.  2+ edema 3/4 to knees bilaterally.   Abdomen: Soft, nontender, no hepatosplenomegaly, no distention.  Neurologic: Alert and oriented x 3.  Psych: Normal affect. Extremities: No clubbing or cyanosis.   TELEMETRY: Reviewed telemetry pt in atrial fibrillation, rate 70s  ASSESSMENT AND PLAN: 59 yo with history of paroxysmal atrial fibrillation, severe OSA, and primarily diastolic CHF presented with atrial fibrillation/RVR and acute on chronic diastolic CHF.  1. Atrial fibrillation: Paroxysmal. He had a TEE-DCCV back in 2013.  He has been in and out of atrial fibrillation since then.  He has not been on an antiarrhythmic.  He has severe biatrial enlargement, so unlikely to hold NSR long-term without anti-arrhythmic and may be unlikely even with anti-arrhythmic.  Given symptoms with CHF exacerbation, would be reasonable to try an attempt at maintaining NSR using amiodarone.  - Continue amiodarone 400 mg bid. - Eliquis started on 2/25.    - If he remains in hospital on Monday, he will need to have TEE-guided DCCV arranged for Monday.  If he goes home, can bring him back after 1 month of Eliquis for DCCV without TEE.  - Continue current Toprol XL.   2. Acute on chronic diastolic CHF: EF 93-26% on echo this admission. Patient remains volume overloaded on exam.  Exacerbation likely triggered by atrial fibrillation/RVR.  He is diuresing well.  - Continue Lasix 40 mg IV every 8 hrs, he is doing well on this regimen.  - Strict I/Os.  3.  Severe OSA, suspect OHS: CPAP at night, may need oxygen during the day when he goes home.   Marlou Porch, [place] 04/08/2014 9:24 AM

## 2014-04-09 ENCOUNTER — Other Ambulatory Visit: Payer: Self-pay | Admitting: Family Medicine

## 2014-04-09 DIAGNOSIS — Z7901 Long term (current) use of anticoagulants: Secondary | ICD-10-CM

## 2014-04-09 DIAGNOSIS — E119 Type 2 diabetes mellitus without complications: Secondary | ICD-10-CM

## 2014-04-09 DIAGNOSIS — Z91199 Patient's noncompliance with other medical treatment and regimen due to unspecified reason: Secondary | ICD-10-CM

## 2014-04-09 DIAGNOSIS — Z9119 Patient's noncompliance with other medical treatment and regimen: Secondary | ICD-10-CM

## 2014-04-09 DIAGNOSIS — R9389 Abnormal findings on diagnostic imaging of other specified body structures: Secondary | ICD-10-CM | POA: Diagnosis present

## 2014-04-09 LAB — GLUCOSE, CAPILLARY
GLUCOSE-CAPILLARY: 95 mg/dL (ref 70–99)
Glucose-Capillary: 73 mg/dL (ref 70–99)

## 2014-04-09 MED ORDER — AMIODARONE HCL 400 MG PO TABS: 400.0000 mg | ORAL_TABLET | Freq: Two times a day (BID) | ORAL | Status: DC

## 2014-04-09 MED ORDER — FUROSEMIDE 40 MG PO TABS: 40.0000 mg | ORAL_TABLET | Freq: Two times a day (BID) | ORAL | Status: DC

## 2014-04-09 MED ORDER — AMIODARONE HCL 200 MG PO TABS: 200.0000 mg | ORAL_TABLET | Freq: Every day | ORAL | Status: DC

## 2014-04-09 MED ORDER — ACETAMINOPHEN 325 MG PO TABS
650.0000 mg | ORAL_TABLET | ORAL | Status: DC | PRN
Start: 2014-04-09 — End: 2014-04-19

## 2014-04-09 MED ORDER — FUROSEMIDE 40 MG PO TABS
40.0000 mg | ORAL_TABLET | Freq: Two times a day (BID) | ORAL | Status: DC
  Administered 2014-04-09: 40 mg via ORAL
  Filled 2014-04-09: qty 1

## 2014-04-09 MED ORDER — METOPROLOL SUCCINATE ER 25 MG PO TB24: 25.0000 mg | ORAL_TABLET | Freq: Two times a day (BID) | ORAL | Status: DC

## 2014-04-09 MED ORDER — APIXABAN 5 MG PO TABS: 5.0000 mg | ORAL_TABLET | Freq: Two times a day (BID) | ORAL | Status: DC

## 2014-04-09 MED ORDER — POTASSIUM CHLORIDE CRYS ER 20 MEQ PO TBCR
20.0000 meq | EXTENDED_RELEASE_TABLET | Freq: Every day | ORAL | Status: DC
  Administered 2014-04-09: 20 meq via ORAL
  Filled 2014-04-09: qty 1

## 2014-04-09 MED ORDER — POTASSIUM CHLORIDE CRYS ER 20 MEQ PO TBCR: 20.0000 meq | EXTENDED_RELEASE_TABLET | Freq: Every day | ORAL | Status: DC

## 2014-04-09 NOTE — Progress Notes (Signed)
Pt discharged to home per MD order. Pt received and reviewed all discharge instructions and medication information including follow-up appointments and prescription information. Pt verbalized understanding. Pt alert and oriented at discharge with no complaints of pain. Pt IV and telemetry box removed prior to discharge. Pt escorted to private vehicle via wheelchair by nurse tech. Dorean Hiebert C  

## 2014-04-09 NOTE — Discharge Instructions (Addendum)
Heart Disease Prevention Heart disease can lead to heart attacks and strokes. This is a leading cause of death. Heart disease can be inherited and can be caused from the lifestyle you lead. You can do a lot to keep your heart and blood vessels healthy.  WHAT SHOULD I DO EACH DAY TO KEEP MY HEART HEALTHY?  Do not smoke.  Follow a healthy eating plan as recommended by your caregiver or dietitian.  Be active for a total of 30 minutes most days. Ask your caregiver what activities are best for you.  Limit the amount of alcohol you drink.  Involve family and friends to help you with a healthy lifestyle. HOW DOES HEART DISEASE CAUSE HIGH BLOOD PRESSURE?  Narrowed blood vessels leave a smaller opening for blood to flow through. It is like turning on a garden hose and holding your thumb over the opening. The smaller opening makes the water shoot out with more pressure. In the same way, narrowed blood vessels can lead to high blood pressure. Other factors, such as kidney problems and being overweight, also can lead to high blood pressure.  If you have high blood pressure you may need to take blood pressure medicine every day. Some types of blood pressure medicine can also help keep your kidneys healthy.  Many people with diabetes also have high blood pressure. If you have heart, eye, or kidney problems from diabetes, high blood pressure can make them worse. HOW DO MY BLOOD VESSELS GET CLOGGED?  Cholesterol is a substance that is made by the body and used for many important functions. It is also found in food that comes from animals. When your cholesterol is high, it can stick to the insides of your blood vessels, making them narrowed and even clogged. This problem is called atherosclerosis.  Narrowed and clogged blood vessels make it harder for blood to get to important body organs. This can cause problems such as:  Chest pain (angina). Angina can cause temporary pain in your chest, arms, shoulders,  or back. You may feel the pain more when your heart beats faster, such as when you exercise. The pain may go away when you rest. You also may feel very weak and sweaty.  A heart attack. A heart attack happens when a blood vessel in or near the heart becomes blocked. Not enough blood is getting to the heart. During a heart attack, you may have chest pain in your chest, arms, shoulders, or back along with nausea, indigestion, extreme weakness, and sweating. WHAT CAN I DO TO PREVENT HEART DISEASE?   Keep your blood pressure under control as recommended by your caregiver.  Keep your cholesterol under control. Have it checked at least once a year. Target cholesterol levels for most people are:  Total blood cholesterol level: Below 200.  LDL (bad) cholesterol: Below 100.  HDL (good) cholesterol: Above 40 in men and above 50 in women.  Triglycerides (another type of fat in the blood): Below 150.  Make physical activity a part of your daily routine. Check with your caregiver to learn what activities are best for you.  Make sure that the foods you eat are "heart-healthy."  Include foods high in fiber, such as oat bran, oatmeal, whole-grain breads and cereals.  Cut back on fried foods and foods high in saturated fat. This includes foods such as meats, butter, whole dairy products, shortening, and coconut or palm oil.  Avoid salty foods such as canned food, luncheon meat, salty snacks, and fast food.  Eat more fruits and vegetables.  Drink less alcohol.  Lose weight as recommended by your caregiver.  If you smoke, quit. Your caregiver can help you with quitting options.  Ask your caregiver whether you should take a daily aspirin. Studies have shown that taking aspirin can help reduce your risk of heart disease and stroke.  Take your prescribed medicines as directed. WHAT ARE THE WARNING SIGNS OF A HEART ATTACK? You may have one or more of the following warning signs:  Chest pain or  discomfort.  Pain or discomfort in your arms, back, jaw, or neck.  Indigestion or stomach pain.  Shortness of breath.  Sweating.  Nausea or vomiting.  Lightheadedness.  No warning signs at all or they may come and go. FOR MORE INFORMATION  To find out more about heart disease and stroke prevention, visit the American Heart Association website at www.americanheart.org Document Released: 09/11/2003 Document Revised: 07/29/2011 Document Reviewed: 03/23/2013 [facility] Patient Information 2015 [place], [place]. This information is not intended to replace advice given to you by your health care provider. Make sure you discuss any questions you have with your health care provider. Atrial Fibrillation Atrial fibrillation is a type of irregular heart rhythm (arrhythmia). During atrial fibrillation, the upper chambers of the heart (atria) quiver continuously in a chaotic pattern. This causes an irregular and often rapid heart rate.  Atrial fibrillation is the result of the heart becoming overloaded with disorganized signals that tell it to beat. These signals are normally released one at a time by a part of the right atrium called the sinoatrial node. They then travel from the atria to the lower chambers of the heart (ventricles), causing the atria and ventricles to contract and pump blood as they pass. In atrial fibrillation, parts of the atria outside of the sinoatrial node also release these signals. This results in two problems. First, the atria receive so many signals that they do not have time to fully contract. Second, the ventricles, which can only receive one signal at a time, beat irregularly and out of rhythm with the atria.  There are three types of atrial fibrillation:   Paroxysmal. Paroxysmal atrial fibrillation starts suddenly and stops on its own within a week.  Persistent. Persistent atrial fibrillation lasts for more than a week. It may stop on its own or with treatment.  Permanent.  Permanent atrial fibrillation does not go away. Episodes of atrial fibrillation may lead to permanent atrial fibrillation. Atrial fibrillation can prevent your heart from pumping blood normally. It increases your risk of stroke and can lead to heart failure.  CAUSES   Heart conditions, including a heart attack, heart failure, coronary artery disease, and heart valve conditions.   Inflammation of the sac that surrounds the heart (pericarditis).  Blockage of an artery in the lungs (pulmonary embolism).  Pneumonia or other infections.  Chronic lung disease.  Thyroid problems, especially if the thyroid is overactive (hyperthyroidism).  Caffeine, excessive alcohol use, and use of some illegal drugs.   Use of some medicines, including certain decongestants and diet pills.  Heart surgery.   Birth defects.  Sometimes, no cause can be found. When this happens, the atrial fibrillation is called lone atrial fibrillation. The risk of complications from atrial fibrillation increases if you have lone atrial fibrillation and you are age 67 years or older. RISK FACTORS  Heart failure.  Coronary artery disease.  Diabetes mellitus.   High blood pressure (hypertension).   Obesity.   Other arrhythmias.   Increased  age. SIGNS AND SYMPTOMS   A feeling that your heart is beating rapidly or irregularly.   A feeling of discomfort or pain in your chest.   Shortness of breath.   Sudden light-headedness or weakness.   Getting tired easily when exercising.   Urinating more often than normal (mainly when atrial fibrillation first begins).  In paroxysmal atrial fibrillation, symptoms may start and suddenly stop. DIAGNOSIS  Your health care provider may be able to detect atrial fibrillation when taking your pulse. Your health care provider may have you take a test called an ambulatory electrocardiogram (ECG). An ECG records your heartbeat patterns over a 24-hour period. You may  also have other tests, such as:  Transthoracic echocardiogram (TTE). During echocardiography, sound waves are used to evaluate how blood flows through your heart.  Transesophageal echocardiogram (TEE).  Stress test. There is more than one type of stress test. If a stress test is needed, ask your health care provider about which type is best for you.  Chest X-ray exam.  Blood tests.  Computed tomography (CT). TREATMENT  Treatment may include:  Treating any underlying conditions. For example, if you have an overactive thyroid, treating the condition may correct atrial fibrillation.  Taking medicine. Medicines may be given to control a rapid heart rate or to prevent blood clots, heart failure, or a stroke.  Having a procedure to correct the rhythm of the heart:  Electrical cardioversion. During electrical cardioversion, a controlled, low-energy shock is delivered to the heart through your skin. If you have chest pain, very low blood pressure, or sudden heart failure, this procedure may need to be done as an emergency.  Catheter ablation. During this procedure, heart tissues that send the signals that cause atrial fibrillation are destroyed.  Surgical ablation. During this surgery, thin lines of heart tissue that carry the abnormal signals are destroyed. This procedure can either be an open-heart surgery or a minimally invasive surgery. With the minimally invasive surgery, small cuts are made to access the heart instead of a large opening.  Pulmonary venous isolation. During this surgery, tissue around the veins that carry blood from the lungs (pulmonary veins) is destroyed. This tissue is thought to carry the abnormal signals. HOME CARE INSTRUCTIONS   Take medicines only as directed by your health care provider. Some medicines can make atrial fibrillation worse or recur.  If blood thinners were prescribed by your health care provider, take them exactly as directed. Too much  blood-thinning medicine can cause bleeding. If you take too little, you will not have the needed protection against stroke and other problems.  Perform blood tests at home if directed by your health care provider. Perform blood tests exactly as directed.  Quit smoking if you smoke.  Do not drink alcohol.  Do not drink caffeinated beverages such as coffee, soda, and some teas. You may drink decaffeinated coffee, soda, or tea.   Maintain a healthy weight.Do not use diet pills unless your health care provider approves. They may make heart problems worse.   Follow diet instructions as directed by your health care provider.  Exercise regularly as directed by your health care provider.  Keep all follow-up visits as directed by your health care provider. This is important. PREVENTION  The following substances can cause atrial fibrillation to recur:   Caffeinated beverages.  Alcohol.  Certain medicines, especially those used for breathing problems.  Certain herbs and herbal medicines, such as those containing ephedra or ginseng.  Illegal drugs, such as cocaine  and amphetamines. Sometimes medicines are given to prevent atrial fibrillation from recurring. Proper treatment of any underlying condition is also important in helping prevent recurrence.  SEEK MEDICAL CARE IF:  You notice a change in the rate, rhythm, or strength of your heartbeat.  You suddenly begin urinating more frequently.  You tire more easily when exerting yourself or exercising. SEEK IMMEDIATE MEDICAL CARE IF:   You have chest pain, abdominal pain, sweating, or weakness.  You feel nauseous.  You have shortness of breath.  You suddenly have swollen feet and ankles.  You feel dizzy.  Your face or limbs feel numb or weak.  You have a change in your vision or speech. MAKE SURE YOU:   Understand these instructions.  Will watch your condition.  Will get help right away if you are not doing well or get  worse. Document Released: 01/27/2005 Document Revised: 06/13/2013 Document Reviewed: 03/09/2012 [facility] Patient Information 2015 [place], [place]. This information is not intended to replace advice given to you by your health care provider. Make sure you discuss any questions you have with your health care provider.  __________________________________ Information on my medicine - ELIQUIS (apixaban)  This medication education was reviewed with me or my healthcare representative as part of my discharge preparation.  The pharmacist that spoke with me during my hospital stay was:  [place], [place], [facility]  Why was Eliquis prescribed for you? Eliquis was prescribed for you to reduce the risk of a blood clot forming that can cause a stroke if you have a medical condition called atrial fibrillation (a type of irregular heartbeat).  What do You need to know about Eliquis ? Take your Eliquis TWICE DAILY - one tablet in the morning and one tablet in the evening with or without food. If you have difficulty swallowing the tablet whole please discuss with your pharmacist how to take the medication safely.  Take Eliquis exactly as prescribed by your doctor and DO NOT stop taking Eliquis without talking to the doctor who prescribed the medication.  Stopping may increase your risk of developing a stroke.  Refill your prescription before you run out.  After discharge, you should have regular check-up appointments with your healthcare provider that is prescribing your Eliquis.  In the future your dose may need to be changed if your kidney function or weight changes by a significant amount or as you get older.  What do you do if you miss a dose? If you miss a dose, take it as soon as you remember on the same day and resume taking twice daily.  Do not take more than one dose of ELIQUIS at the same time to make up a missed dose.  Important Safety Information A possible side effect of Eliquis is  bleeding. You should call your healthcare provider right away if you experience any of the following: ? Bleeding from an injury or your nose that does not stop. ? Unusual colored urine (red or dark brown) or unusual colored stools (red or black). ? Unusual bruising for unknown reasons. ? A serious fall or if you hit your head (even if there is no bleeding).  Some medicines may interact with Eliquis and might increase your risk of bleeding or clotting while on Eliquis. To help avoid this, consult your healthcare provider or pharmacist prior to using any new prescription or non-prescription medications, including herbals, vitamins, non-steroidal anti-inflammatory drugs (NSAIDs) and supplements.  This website has more information on Eliquis (apixaban): http://www.eliquis.com/eliquis/home   Atrial  Fibrillation Atrial fibrillation is a condition that causes your heart to beat irregularly. It may also cause your heart to beat faster than normal. Atrial fibrillation can prevent your heart from pumping blood normally. It increases your risk of stroke and heart problems. HOME CARE  Take medications as told by your doctor.  Only take medications that your doctor says are safe. Some medications can make the condition worse or happen again.  If blood thinners were prescribed by your doctor, take them exactly as told. Too much can cause bleeding. Too little and you will not have the needed protection against stroke and other problems.  Perform blood tests at home if told by your doctor.  Perform blood tests exactly as told by your doctor.  Do not drink alcohol.  Do not drink beverages with caffeine such as coffee, soda, and some teas.  Maintain a healthy weight.  Do not use diet pills unless your doctor says they are safe. They may make heart problems worse.  Follow diet instructions as told by your doctor.  Exercise regularly as told by your doctor.  Keep all follow-up appointments. GET  HELP IF:  You notice a change in the speed, rhythm, or strength of your heartbeat.  You suddenly begin peeing (urinating) more often.  You get tired more easily when moving or exercising. GET HELP RIGHT AWAY IF:   You have chest or belly (abdominal) pain.  You feel sick to your stomach (nauseous).  You are short of breath.  You suddenly have swollen feet and ankles.  You feel dizzy.  You face, arms, or legs feel numb or weak.  There is a change in your vision or speech. MAKE SURE YOU:   Understand these instructions.  Will watch your condition.  Will get help right away if you are not doing well or get worse. Document Released: 11/06/2007 Document Revised: 06/13/2013 Document Reviewed: 03/09/2012 [facility] Patient Information 2015 [place], [place]. This information is not intended to replace advice given to you by your health care provider. Make sure you discuss any questions you have with your health care provider.

## 2014-04-09 NOTE — Discharge Summary (Signed)
Patient ID: Corney Knighton,  MRN: [id number], DOB/AGE: [birth year] 61 y.o.  Admit date: 04/02/2014 Discharge date: 04/09/2014  Primary Care Provider: [name]., DO Primary Cardiologist: [name]  Discharge Diagnoses Principal Problem:   Acute combined systolic and diastolic heart failure Active Problems:   Atrial fibrillation with rapid ventricular response   OSA - C-pap   Type 2 diabetes mellitus   Non compliance with medical treatment and diet   Chronic anticoagulation-Eliquis started 04/06/14   Obesity, Class III, BMI 40-49.9 (morbid obesity)   Abnormal chest CT-LUL nodule- needs f/u May 2016    Hospital Course:  15M with a history of HFpEF, DM2, morbid obesity who presents with shortness of breath and tachycardia. He had seen [name] in the past. He has had a history of PAF and had DCCV 2013. He had been holding NSR. Myoview in Jan 2015 was low risk. Echo Jan 2015 showed Nl LVF. He had stopped his Amiodarone as an OP. He has been non compliant with Xarelto, Pradaxa, and Eliquis secondary to cost, and not fel to be a good Coumadin candidate.            He presented 04/02/14 with dyspnea and rapid AF. He reports that over the past 3 days prior to admission he had progressive dyspnea on exertion. He normally is able to walk around a [employer] with his cart but lately has been getting increasingly dyspneic and fatigued when trying to do so to the point where it is difficult for him to walk even a few minutes without developing worsening shortness of breath. He is on chronic Lasix therapy but has missed a few doses recently. He also reports dietary noncompliance and recently ate an entire box of candy. Due to persistent symptoms he presented to urgent care earlier and was found to have reverted back into AF so he was advised to come to the ED.             He was admitted and placed on IV Diltiazem and Heparin. His CHA2DsVasc =2. Echo revealed an EF of 45-50% with both RA and LA  enlargement. His admission wgt was 350 ( down to 326 at discharge). It was felt he would not hold NSR without an antiarrythmic so the plan is to re load him with Amiodarone and DCCV in a month. Eliquis was started 04/06/14. His O2 on RA with ambulation was 90-92% so he doesn't qualify for daytime O2. Dr Marlou Porch feels he can be discharged 04/09/14. He will need TOC f/u in one week. HHRN is to be arranged as well as one month of Eliquis. He will continue Amiodarone 400 mg BID x two weeks, then reduce this to 100 mg daily.   Discharge Vitals:  Blood pressure 110/79, pulse 96, temperature 98.2 F (36.8 C), temperature source Oral, resp. rate 24, height 6' (1.829 m), weight 326 lb 8 oz (148.099 kg), SpO2 94 %.    Labs: Results for orders placed or performed during the hospital encounter of 04/02/14 (from the past 24 hour(s))  Glucose, capillary     Status: None   Collection Time: 04/08/14  5:06 PM  Result Value Ref Range   Glucose-Capillary 93 70 - 99 mg/dL  Glucose, capillary     Status: None   Collection Time: 04/09/14  7:47 AM  Result Value Ref Range   Glucose-Capillary 95 70 - 99 mg/dL    Disposition:      Follow-up Information    Follow up  with Loralie Champagne, MD.   Specialty:  Cardiology   Why:  office will contact you   Contact information:   [address] [place] [address] [phone]       Discharge Medications:    Medication List    STOP taking these medications        aspirin EC 81 MG tablet     CARTIA XT 120 MG 24 hr capsule  Generic drug:  diltiazem      TAKE these medications        acetaminophen 325 MG tablet  Commonly known as:  TYLENOL  Take 2 tablets (650 mg total) by mouth every 4 (four) hours as needed for headache or mild pain.     amiodarone 400 MG tablet  Commonly known as:  PACERONE  Take 1 tablet (400 mg total) by mouth 2 (two) times daily.     amiodarone 200 MG tablet  Commonly known as:  PACERONE  Take 1 tablet (200 mg  total) by mouth daily.  Start taking on:  04/24/2014     apixaban 5 MG Tabs tablet  Commonly known as:  ELIQUIS  Take 1 tablet (5 mg total) by mouth 2 (two) times daily.     BAYER CONTOUR MONITOR W/DEVICE Kit  1 each by Does not apply route once. Use twice a day to check blood sugar     furosemide 40 MG tablet  Commonly known as:  LASIX  Take 1 tablet (40 mg total) by mouth 2 (two) times daily.     glucose blood test strip  Commonly known as:  BAYER CONTOUR TEST  Use as instructed to check blood sugar twice a day     metFORMIN 1000 MG tablet  Commonly known as:  GLUCOPHAGE  take 1 tablet by mouth twice a day with food     metoprolol succinate 25 MG 24 hr tablet  Commonly known as:  TOPROL-XL  Take 1 tablet (25 mg total) by mouth 2 (two) times daily.     potassium chloride SA 20 MEQ tablet  Commonly known as:  K-DUR,KLOR-CON  Take 1 tablet (20 mEq total) by mouth daily.         Duration of Discharge Encounter: Greater than 30 minutes including physician time.  Angelena Form PA-C 04/09/2014 10:57 AM

## 2014-04-09 NOTE — Progress Notes (Signed)
Patient ID: Darius [name], male   DOB: May 10, 1955, 59 y.o.   MRN: [id number]   SUBJECTIVE: Patient remains in atrial fibrillation, rate is well-controlled now.    Weight down.  Legs improved.  WT: 326 from 348 "never been that low". Has scale now.   Echo: EF 45-50%, moderate LV dilation, severe biatrial enlargement  Scheduled Meds: . amiodarone  400 mg Oral BID  . apixaban  5 mg Oral BID  . benzonatate  100 mg Oral BID  . furosemide  40 mg Intravenous TID  . insulin aspart  0-20 Units Subcutaneous TID WC  . insulin aspart  3 Units Subcutaneous TID WC  . metoprolol succinate  25 mg Oral BID  . potassium chloride  40 mEq Oral Daily   Continuous Infusions:  PRN Meds:.acetaminophen, ondansetron (ZOFRAN) IV, zolpidem    Filed Vitals:   04/08/14 1604 04/08/14 2020 04/09/14 0500 04/09/14 0806  BP: 122/74 116/62 124/81 110/79  Pulse:  77 88 96  Temp:  98.2 F (36.8 C) 98.2 F (36.8 C)   TempSrc:  Oral    Resp:  20 24   Height:      Weight:   326 lb 8 oz (148.099 kg)   SpO2:  94% 94%     Intake/Output Summary (Last 24 hours) at 04/09/14 0903 Last data filed at 04/09/14 0848  Gross per 24 hour  Intake   2276 ml  Output   2550 ml  Net   -274 ml    LABS: Basic Metabolic Panel:  Recent Labs  04/07/14 0351 04/08/14 0440  NA 134* 135  K 4.2 4.2  CL 98 101  CO2 27 27  GLUCOSE 79 90  BUN 11 13  CREATININE 0.95 0.86  CALCIUM 8.7 8.6     Recent Labs  04/07/14 0351 04/08/14 0440  WBC 6.7 5.5  HGB 16.5 17.4*  HCT 49.8 50.9  MCV 83.6 84.6  PLT 216 240   RADIOLOGY: Dg Chest 2 View  04/02/2014   CLINICAL DATA:  Shortness of breath for 3 days. History of diabetes, hypertension, heart disease.  EXAM: CHEST  2 VIEW  COMPARISON:  03/30/2011.  FINDINGS: Cardiac enlargement. Mild vascular congestion. No edema or consolidation. No blunting of costophrenic angles. No pneumothorax. Tortuous aorta. Degenerative changes in the spine.  IMPRESSION: Cardiac enlargement with mild  pulmonary vascular congestion. No edema or consolidation.   Electronically Signed   By: Lucienne Capers M.D.   On: 04/02/2014 23:02   Ct Angio Chest Pe W/cm &/or Wo Cm  04/03/2014   CLINICAL DATA:  Shortness of breath, nonproductive cough.  EXAM: CT ANGIOGRAPHY CHEST WITH CONTRAST  TECHNIQUE: Multidetector CT imaging of the chest was performed using the standard protocol during bolus administration of intravenous contrast. Multiplanar CT image reconstructions and MIPs were obtained to evaluate the vascular anatomy.  CONTRAST:  61mL OMNIPAQUE IOHEXOL 350 MG/ML SOLN  COMPARISON:  Chest radiograph earlier this date.  FINDINGS: There are no filling defects within the pulmonary arteries to suggest pulmonary embolus. There is multi chamber cardiomegaly. The thoracic aorta is normal in caliber. Small left pleural effusion. There is fluid in the superior pericardial recess without pericardial effusion. Shotty mediastinal and hilar lymphadenopathy without pathologically enlarged lymph nodes.  Ill-defined perihilar ground-glass and reticulonodular opacities, left greater than right. Within the periphery of the anterior left upper lobe is a 11 x 12 x 10 mm slightly more confluent nodular density. No confluent airspace disease to suggest lobar pneumonia.  Evaluation of the  upper abdomen demonstrates hepatic steatosis. Calcification in the gallbladder, may reflect wall calcification versus stone.  There are no acute or suspicious osseous abnormalities.  Review of the MIP images confirms the above findings.  IMPRESSION: 1. No pulmonary embolus. 2. Perihilar ground-glass reticulonodular opacities, left greater than right. This may reflect pulmonary edema, inflammatory versus less likely infection. Slightly more confluent nodule in the periphery of the left upper lobe measures 11 x 12 x 10 mm, this is likely related to the underlying diffuse process, however follow-up chest CT recommended in 3 months to evaluate for imaging  stability resolution. There is cardiomegaly and a small left pleural effusion, findings may reflect CHF.   Electronically Signed   By: Jeb Levering M.D.   On: 04/03/2014 03:08    PHYSICAL EXAM General: NAD Neck: JVP difficult but appears elevated, no thyromegaly or thyroid nodule.  Lungs: Clear to auscultation bilaterally with normal respiratory effort. CV: Nondisplaced PMI.  Heart irregular S1/S2, no S3/S4, no murmur.  2+ edema 3/4 to knees bilaterally.   Abdomen: Soft, nontender, no hepatosplenomegaly, no distention.  Neurologic: Alert and oriented x 3.  Psych: Normal affect. Extremities: No clubbing or cyanosis.   TELEMETRY: Reviewed telemetry pt in atrial fibrillation, rate 70s  Scheduled Meds: . amiodarone  400 mg Oral BID  . apixaban  5 mg Oral BID  . benzonatate  100 mg Oral BID  . furosemide  40 mg Intravenous TID  . insulin aspart  0-20 Units Subcutaneous TID WC  . insulin aspart  3 Units Subcutaneous TID WC  . metoprolol succinate  25 mg Oral BID  . potassium chloride  40 mEq Oral Daily   Continuous Infusions:  PRN Meds:.acetaminophen, ondansetron (ZOFRAN) IV, zolpidem  ASSESSMENT AND PLAN:  59 yo with history of paroxysmal atrial fibrillation, severe OSA, and primarily diastolic CHF presented with atrial fibrillation/RVR and acute on chronic diastolic CHF.   1. Atrial fibrillation: Paroxysmal. He had a TEE-DCCV back in 2013.  He has been in and out of atrial fibrillation since then.  He has not been on an antiarrhythmic.  He has severe biatrial enlargement, so unlikely to hold NSR long-term without anti-arrhythmic and may be unlikely even with anti-arrhythmic.  Given symptoms with CHF exacerbation, would be reasonable to try an attempt at maintaining NSR using amiodarone.  - Continue amiodarone 400 mg bid. Complete 2 week load. - Eliquis started on 2/25.   Express compliance. - after 1 month of Eliquis consider for DCCV without TEE.  - Continue current Toprol XL.      2. Acute on chronic diastolic CHF: EF 08-65% on echo this admission. Patient improved -11 liters, down 20 pounds.   Exacerbation likely triggered by atrial fibrillation/RVR.  He is diuresing well.   - Continue Lasix 40mg  PO BID as outpatient.  - KCL 20 meq.   3. Severe OSA, suspect OHS: CPAP at night, may need oxygen during the day when he goes home. Please test for home O2.   OK to DC home. Follow up with Dr. [name] in 7 days TOC.  SKAINS, [place] 04/09/2014 9:03 AM

## 2014-04-10 ENCOUNTER — Telehealth: Payer: Self-pay | Admitting: Physician Assistant

## 2014-04-10 LAB — GLUCOSE, CAPILLARY
Glucose-Capillary: 91 mg/dL (ref 70–99)
Glucose-Capillary: 132 mg/dL — ABNORMAL HIGH (ref 70–99)

## 2014-04-10 NOTE — Telephone Encounter (Signed)
Patient contacted regarding discharge from [facility] on April 09, 2014.  Patient understands to follow up with provider Versie Starks on April 19, 2014 at 8:50AM at [facility]. Patient understands discharge instructions? yes Patient understands medications and regiment? yes Patient understands to bring all medications to this visit? yes

## 2014-04-10 NOTE — Telephone Encounter (Signed)
New problem   TCM on 3.9.16 w/Scott Weaver.

## 2014-04-18 NOTE — Progress Notes (Addendum)
Cardiology Office Note   Date:  04/19/2014   ID:  Darius [name], DOB [birth year], MRN [id number]  PCP:  [name]., DO  Cardiologist:  Dr. Loralie Champagne     Chief Complaint  Patient presents with  . Congestive Heart Failure  . Atrial Fibrillation  . Hospitalization Follow-up     History of Present Illness: Darius [name] is a 59 y.o. male with a hx of DM, AFib, diastolic HF.  Patient was initially evaluated in the hospital in 03/2011 with new-onset atrial fibrillation with RVR in the setting of possible pneumonia as well as diastolic heart failure. Echo 03/27/11: EF 50-55%, inf HK.  He underwent TEE guided cardioversion. TEE (04/03/11): EF 40-45%, mild MR moderate LAE, no LAA clot, mild RAE. TEE had to be performed under general anesthesia as he became combative with the standard approach. However, he went back into atrial fibrillation in less than 24 hours. He was started on amiodarone to help restore NSR with the thought that NSR would shrink his LA some and make him eligible for PVI ablation. Coumadin was used for anticoagulation as he could not afford Xarelto or Pradaxa. He needed an outpatient stress test due to his wall motion abnormality to help guide further anti-arrhythmic choices. Of note, his QTc was too long for Tikosyn. He needed a sleep study arranged.  He was in the office 04/17/2011. At that time he remained in atrial fibrillation. He was on amiodarone and the plan was to proceed with TEE guided cardioversion. However, this was never performed. A stress test was also arranged. This also was never performed. His chart indicates that he stopped Coumadin on his own. A left LE venous US in 05/2012 was negative for DVT.   I saw him in 01/2013.  Myoview was done and this was neg for ischemia.  EF was low and FU echo demonstrated normal LVF.  He was to have early FU but was never seen back.    Admitted 2/21-2/28 with a/c combined systolic and diastolic CHF in the setting of AF  with RVR.  Weight was down 24 lbs at DC. He was placed on Amiodarone and Eliquis.  Plan is to continue Amiodarone 1 month and proceed with DCCV.  He returns for FU.  Breathing remains improved.  NYHA 2-2b.  No chest pain.  No syncope.  Gets dizzy with coughing.   No orthopnea, PND.  LE edema much improved.  Wearing CPAP at night.  Weights at home down some (321 >> 315 >> 324 today).       Studies/Reports Reviewed Today:  Echo 04/04/14 - EF 45-50%. - Left atrium: The atrium was severely dilated. - Right atrium: The atrium was severely dilated.  Myoview 02/17/13 Low risk stress nuclear study  No ischemia or infarct  Appears to be LVE with diffuse hypokinesis Suggest MRI or echo correlation of EF.  LV Ejection Fraction: 51%  Chest CTA 04/03/14 IMPRESSION: 1. No pulmonary embolus. 2. Perihilar ground-glass reticulonodular opacities, left greater than right. This may reflect pulmonary edema, inflammatory versus less likely infection. Slightly more confluent nodule in the periphery of the left upper lobe measures 11 x 12 x 10 mm, this is likely related to the underlying diffuse process, however follow-up chest CT recommended in 3 months to evaluate for imaging stability resolution. There is cardiomegaly and a small left pleural effusion, findings may reflect CHF.   Past Medical History  Diagnosis Date  . Morbid obesity   . Atrial fibrillation  unable to tolerate versed and fentanyl sedation and required gen anesthesia for TEE;  s/p TEE-DCCV (failed);  Amiodarone started  . Chronic diastolic heart failure     a. Echo 03/27/11: EF 50-55%, inferior hypokinesis, moderate LAE, mild RVE, normal pulmonary pressures. ;   b.  TEE (04/03/11): EF 40-45%, mild MR moderate LAE, no LAA clot, mild RAE;  c.  Echocardiogram (02/2013): EF 60-65%, Gr 2 DD, mildly dilated Ao root (Ao root dimension 39 mm), MAC, mild LAE, normal RVF  . Sleep apnea     a. sleep test 01/2013:  very severy OSA  . Diabetes  mellitus, type 2   . Hx of cardiovascular stress test     Lexiscan Myoview (02/2013): No ischemia or scar, EF 51%, low risk    Past Surgical History  Procedure Laterality Date  . Tee without cardioversion  04/01/2011    Procedure: TRANSESOPHAGEAL ECHOCARDIOGRAM (TEE);  Surgeon: Lelon Perla, MD;  Location: [facility] ENDOSCOPY;  Service: Cardiovascular;  Laterality: N/A;  . Cardioversion  04/01/2011    Procedure: CARDIOVERSION;  Surgeon: Lelon Perla, MD;  Location: [facility] ENDOSCOPY;  Service: Cardiovascular;  Laterality: N/A;  . Tee without cardioversion  04/03/2011    Procedure: TRANSESOPHAGEAL ECHOCARDIOGRAM (TEE);  Surgeon: Lelon Perla, MD;  Location: [facility] ENDOSCOPY;  Service: Cardiovascular;  Laterality: N/A;  . Cardioversion  04/03/2011    Procedure: CARDIOVERSION;  Surgeon: Lelon Perla, MD;  Location: [facility] ENDOSCOPY;  Service: Cardiovascular;  Laterality: N/A;  . Tee without cardioversion  04/03/2011    Procedure: TRANSESOPHAGEAL ECHOCARDIOGRAM (TEE);  Surgeon: Lelon Perla, MD;  Location: [facility];  Service: Cardiovascular;  Laterality: N/A;  . Cardioversion  04/03/2011    Procedure: CARDIOVERSION;  Surgeon: Lelon Perla, MD;  Location: [facility] OR;  Service: Cardiovascular;  Laterality: N/A;     Current Outpatient Prescriptions  Medication Sig Dispense Refill  . [START ON 04/24/2014] amiodarone (PACERONE) 200 MG tablet Take 1 tablet (200 mg total) by mouth daily. 30 tablet 11  . amiodarone (PACERONE) 400 MG tablet Take 1 tablet (400 mg total) by mouth 2 (two) times daily. 28 tablet 0  . apixaban (ELIQUIS) 5 MG TABS tablet Take 1 tablet (5 mg total) by mouth 2 (two) times daily. 60 tablet 11  . Blood Glucose Monitoring Suppl (BAYER CONTOUR MONITOR) W/DEVICE KIT 1 each by Does not apply route once. Use twice a day to check blood sugar 1 kit 0  . CARTIA XT 120 MG 24 hr capsule Take 120 mg by mouth daily.  0  . furosemide (LASIX) 40 MG tablet Take 1 tablet (40 mg total) by mouth 2 (two)  times daily. 60 tablet 11  . glucose blood (BAYER CONTOUR TEST) test strip Use as instructed to check blood sugar twice a day 100 each 3  . metFORMIN (GLUCOPHAGE) 1000 MG tablet take 1 tablet by mouth twice a day with food 60 tablet 0  . metoprolol succinate (TOPROL-XL) 25 MG 24 hr tablet Take 1 tablet (25 mg total) by mouth 2 (two) times daily. 30 tablet 11  . potassium chloride SA (K-DUR,KLOR-CON) 20 MEQ tablet Take 1 tablet (20 mEq total) by mouth daily. 30 tablet 11   No current facility-administered medications for this visit.    Allergies:   Review of patient's allergies indicates no known allergies.    Social History:  The patient  reports that he has never smoked. He does not have any smokeless tobacco history on file. He reports that he drinks  alcohol. He reports that he does not use illicit drugs.   Family History:  The patient's family history includes Diabetes in his father; Hypertension in his father, paternal grandfather, and sister; Seizures in his mother. There is no history of Heart attack.    ROS:   Please see the history of present illness.   Review of Systems  Cardiovascular: Positive for palpitations.  Respiratory: Positive for cough and shortness of breath. Negative for hemoptysis.        Cough resolved.  Gastrointestinal: Negative for hematochezia and melena.  Genitourinary: Negative for hematuria.  All other systems reviewed and are negative.     PHYSICAL EXAM: VS:  BP 110/77 mmHg  Pulse 66  Ht 6' (1.829 m)  Wt 328 lb (148.78 kg)  BMI 44.48 kg/m2    Wt Readings from Last 3 Encounters:  04/19/14 328 lb (148.78 kg)  04/02/14 355 lb (161.027 kg)  11/16/13 346 lb 3.2 oz (157.035 kg)     GEN: Well nourished, well developed, in no acute distress HEENT: normal Neck: no JVD, no masses Cardiac:  Normal S1/S2, irreg irreg rhythm; no murmur, no rubs or gallops, trace edema  Respiratory:  clear to auscultation bilaterally, no wheezing, rhonchi or rales. GI:  soft, nontender, nondistended, + BS MS: no deformity or atrophy Skin: warm and dry  Neuro:  CNs II-XII intact, Strength and sensation are intact Psych: Normal affect   EKG:  EKG is ordered today.  It demonstrates:   AFib, HR 66   Recent Labs: 04/02/2014: B Natriuretic Peptide 144.3* 04/03/2014: ALT 57*; Magnesium 1.9; TSH 1.238 04/08/2014: BUN 13; Creatinine 0.86; Hemoglobin 17.4*; Platelets 240; Potassium 4.2; Sodium 135    Lipid Panel    Component Value Date/Time   CHOL 167 02/11/2013 1527   TRIG 131.0 02/11/2013 1527   HDL 43.20 02/11/2013 1527   CHOLHDL 4 02/11/2013 1527   VLDL 26.2 02/11/2013 1527   LDLCALC 98 02/11/2013 1527      ASSESSMENT AND PLAN:  Chronic combined systolic and diastolic heart failure:  Volume appears stable.  He had increased his weight recently at home.  Exam is not remarkable for volume excess.  I have asked him to take an extra dose of Lasix tomorrow if his weight is >/= 1 lb higher tomorrow.  Check BMET today.  Persistent atrial fibrillation:  Rate controlled.  Continue Amiodarone.  We discussed how to change his dose from 400 mg Twice daily to 200 mg Once daily.  Continue Eliquis.  We discussed the importance of compliance.  Arrange FU BMET, CBC in 4 weeks.  Arrange FU with CVRR clinic for Eliquis (new start) given his issues with compliance in the past.  Will make sure he is able to get Eliquis and has all the assistance he needs. Will get samples of Eliquis as well.    Dilated cardiomyopathy:  Probably related to RVR with AFib.  Continue beta-blocker. If EF does not improve with NSR, consider adding ACEI.    OSA - C-pap:  Continue CPAP.  Type 2 diabetes mellitus without complication - Plan: Ambulatory referral to [facility]    Current medicines are reviewed at length with the patient today.  The patient does not have concerns regarding medicines.  The following changes have been made:  As above.   Labs/ tests ordered today include:    Orders Placed This Encounter  Procedures  . Basic Metabolic Panel (BMET)  . Basic Metabolic Panel (BMET)  . CBC w/Diff  . Ambulatory  referral to [facility]  . EKG 12-Lead    Disposition:   FU with Dr. Loralie Champagne or me 3 weeks.    Signed, Versie Starks, MHS 04/19/2014 9:31 AM    [facility] [place], [place], [place]  [address] Phone: [phone]; Fax: [phone]     ADDENDUM: Note patient does need repeat CT of chest to re-eval reticulonodular opacities in 3 months. Richardson Dopp, PA-C   04/20/2014 8:18 AM

## 2014-04-19 ENCOUNTER — Encounter: Payer: Self-pay | Admitting: Physician Assistant

## 2014-04-19 ENCOUNTER — Ambulatory Visit (INDEPENDENT_AMBULATORY_CARE_PROVIDER_SITE_OTHER): Payer: BLUE CROSS/BLUE SHIELD | Admitting: Physician Assistant

## 2014-04-19 ENCOUNTER — Telehealth: Payer: Self-pay | Admitting: *Deleted

## 2014-04-19 ENCOUNTER — Encounter: Payer: BLUE CROSS/BLUE SHIELD | Admitting: Physician Assistant

## 2014-04-19 VITALS — BP 110/77 | HR 66 | Ht 72.0 in | Wt 328.0 lb

## 2014-04-19 DIAGNOSIS — G4733 Obstructive sleep apnea (adult) (pediatric): Secondary | ICD-10-CM

## 2014-04-19 DIAGNOSIS — I5042 Chronic combined systolic (congestive) and diastolic (congestive) heart failure: Secondary | ICD-10-CM

## 2014-04-19 DIAGNOSIS — I420 Dilated cardiomyopathy: Secondary | ICD-10-CM

## 2014-04-19 DIAGNOSIS — I481 Persistent atrial fibrillation: Secondary | ICD-10-CM

## 2014-04-19 DIAGNOSIS — I4819 Other persistent atrial fibrillation: Secondary | ICD-10-CM

## 2014-04-19 DIAGNOSIS — E119 Type 2 diabetes mellitus without complications: Secondary | ICD-10-CM

## 2014-04-19 LAB — BASIC METABOLIC PANEL
BUN: 14 mg/dL (ref 6–23)
CALCIUM: 9.8 mg/dL (ref 8.4–10.5)
CO2: 28 mEq/L (ref 19–32)
CREATININE: 0.92 mg/dL (ref 0.40–1.50)
Chloride: 100 mEq/L (ref 96–112)
GFR: 89.11 mL/min (ref >60.00)
GLUCOSE: 104 mg/dL — AB (ref 70–99)
Potassium: 3.8 mEq/L (ref 3.5–5.1)
Sodium: 136 mEq/L (ref 135–145)

## 2014-04-19 NOTE — Patient Instructions (Addendum)
FOLLOW UP WITH DR. [name] IN 3 WEEKS OR SCOTT WEAVER, PAC SAME DAY DR. [name] IS IN THE OFFICE  LAB WORK TODAY; BMET  LAB WORK IN 4 WEEKS; BMET, CBC W/DIFF  A REFERRAL TO COUMADIN CLINIC FOR YOUR ELIQUIS  PT WANTS TO CHANGE PROVIDERS AT [facility]; SEE NEW REFERRAL   IF YOUR WEIGHT IS UP BY 1 LB TOMORROW THEN YOU WILL NEED TO TAKE 1 EXTRA LASIX; IF YOUR WEIGHT IS STILL UP ON Friday 3/11 YOU WILL NEED TO CALL THE OFFICE [phone] FOR FURTHER ADVICE  MAKE SURE TO FINISH THE AMIODARONE 400 MG TWICE DAILY BOTTLE AND WHEN YOU ARE DONE WITH THAT BOTTLE YOU WILL START ON THE AMIODARONE 200 MG 1 TABLET DAILY BOTTLE.

## 2014-04-19 NOTE — Telephone Encounter (Signed)
lmom labs ok , kidney function normal, K+ normal. Any questions cb [phone]

## 2014-04-22 ENCOUNTER — Other Ambulatory Visit: Payer: Self-pay | Admitting: Family Medicine

## 2014-04-25 ENCOUNTER — Telehealth: Payer: Self-pay | Admitting: Physician Assistant

## 2014-04-25 NOTE — Telephone Encounter (Signed)
The patient call.  Text page stated he was bit by a cat and thought he needed an antibiotic.  He did not answer when I called back.  I left a message recommending he go to urgent care to have the wound looked at.  He will need an antibiotic.  Peja Allender, PA-C

## 2014-04-27 ENCOUNTER — Telehealth: Payer: Self-pay | Admitting: *Deleted

## 2014-04-27 DIAGNOSIS — Z8709 Personal history of other diseases of the respiratory system: Secondary | ICD-10-CM

## 2014-04-27 DIAGNOSIS — R911 Solitary pulmonary nodule: Secondary | ICD-10-CM

## 2014-04-27 NOTE — Telephone Encounter (Signed)
lmptcb to advise pt per Brynda Rim. PA the he will need a Chest CT in 3 months due edema on Chest CT in the hospital was obscuring some things - made it look like there may be a nodule.

## 2014-04-28 ENCOUNTER — Other Ambulatory Visit: Payer: Self-pay

## 2014-04-28 MED ORDER — CARTIA XT 120 MG PO CP24: 120.0000 mg | ORAL_CAPSULE | Freq: Every day | ORAL | Status: DC

## 2014-04-28 MED ORDER — METOPROLOL SUCCINATE ER 25 MG PO TB24: 25.0000 mg | ORAL_TABLET | Freq: Two times a day (BID) | ORAL | Status: DC

## 2014-05-02 NOTE — Telephone Encounter (Signed)
lmptcb x 2 to advise pt per Brynda Rim. PA when he saw pt on 3/9 that he forgot to order a chest ct to be done in 3 months.

## 2014-05-03 NOTE — Telephone Encounter (Signed)
reached pt today at this time I advised pt that [employer]. PA wants to have a CHEST CT done in 3 months to compare to one from hospital last month to assess edema as well as ? nodule. pt agreeable to plan of care.

## 2014-05-14 NOTE — Progress Notes (Signed)
Cardiology Office Note   Date:  05/15/2014   ID:  Darius [name], DOB Dec 02, 1955, MRN [id number]  PCP:  [name]., DO  Cardiologist:  Dr. Loralie Champagne     Chief Complaint  Patient presents with  . Irregular Heart Beat     History of Present Illness: Darius [name] is a 60 y.o. male with a hx of DM, AFib, diastolic HF.  Patient was initially evaluated in the hospital in 03/2011 with new-onset atrial fibrillation with RVR in the setting of possible pneumonia as well as diastolic heart failure. Echo 03/27/11: EF 50-55%, inf HK.  He underwent TEE guided cardioversion. TEE (04/03/11): EF 40-45%, mild MR moderate LAE, no LAA clot, mild RAE. TEE had to be performed under general anesthesia as he became combative with the standard approach. However, he went back into atrial fibrillation in less than 24 hours. He was started on amiodarone to help restore NSR with the thought that NSR would shrink his LA some and make him eligible for PVI ablation. Coumadin was used for anticoagulation as he could not afford Xarelto or Pradaxa. He needed an outpatient stress test due to his wall motion abnormality to help guide further anti-arrhythmic choices. Of note, his QTc was too long for Tikosyn. He needed a sleep study arranged.  He was in the office 04/17/2011. At that time he remained in atrial fibrillation. He was on amiodarone and the plan was to proceed with TEE guided cardioversion. However, this was never performed. A stress test was also arranged. This also was never performed. His chart indicates that he stopped Coumadin on his own. A left LE venous US in 05/2012 was negative for DVT.   I saw him in 01/2013.  Myoview was done and this was neg for ischemia.  EF was low and FU echo demonstrated normal LVF.  He was to have early FU but was never seen back.    Admitted 2/21-2/28 with a/c combined systolic and diastolic CHF in the setting of AF with RVR.  Weight was down 24 lbs at DC. He was placed on  Amiodarone and Eliquis.  Plan is to continue Amiodarone 1 month and proceed with DCCV.  Last seen by me 04/19/14 for post hosp FU.  He returns for FU.  He is overall doing well. His breathing is stable. He is NYHA 2-2b. He denies significant edema. He denies chest pain. He denies syncope. He denies orthopnea or PND.   Studies/Reports Reviewed Today:  Echo 04/04/14 - EF 45-50%. - Left atrium: The atrium was severely dilated. - Right atrium: The atrium was severely dilated.  Myoview 02/17/13 Low risk stress nuclear study  No ischemia or infarct  Appears to be LVE with diffuse hypokinesis Suggest MRI or echo correlation of EF.  LV Ejection Fraction: 51%  Chest CTA 04/03/14 IMPRESSION: 1. No pulmonary embolus. 2. Perihilar ground-glass reticulonodular opacities, left greater than right. This may reflect pulmonary edema, inflammatory versus less likely infection. Slightly more confluent nodule in the periphery of the left upper lobe measures 11 x 12 x 10 mm, this is likely related to the underlying diffuse process, however follow-up chest CT recommended in 3 months to evaluate for imaging stability resolution. There is cardiomegaly and a small left pleural effusion, findings may reflect CHF.   Past Medical History  Diagnosis Date  . Morbid obesity   . Atrial fibrillation     unable to tolerate versed and fentanyl sedation and required gen anesthesia for TEE;  s/p TEE-DCCV (  failed);  Amiodarone started  . Chronic diastolic heart failure     a. Echo 03/27/11: EF 50-55%, inferior hypokinesis, moderate LAE, mild RVE, normal pulmonary pressures. ;   b.  TEE (04/03/11): EF 40-45%, mild MR moderate LAE, no LAA clot, mild RAE;  c.  Echocardiogram (02/2013): EF 60-65%, Gr 2 DD, mildly dilated Ao root (Ao root dimension 39 mm), MAC, mild LAE, normal RVF  . Sleep apnea     a. sleep test 01/2013:  very severy OSA  . Diabetes mellitus, type 2   . Hx of cardiovascular stress test     Lexiscan Myoview  (02/2013): No ischemia or scar, EF 51%, low risk    Past Surgical History  Procedure Laterality Date  . Tee without cardioversion  04/01/2011    Procedure: TRANSESOPHAGEAL ECHOCARDIOGRAM (TEE);  Surgeon: Lelon Perla, MD;  Location: [facility] ENDOSCOPY;  Service: Cardiovascular;  Laterality: N/A;  . Cardioversion  04/01/2011    Procedure: CARDIOVERSION;  Surgeon: Lelon Perla, MD;  Location: [facility] ENDOSCOPY;  Service: Cardiovascular;  Laterality: N/A;  . Tee without cardioversion  04/03/2011    Procedure: TRANSESOPHAGEAL ECHOCARDIOGRAM (TEE);  Surgeon: Lelon Perla, MD;  Location: [facility] ENDOSCOPY;  Service: Cardiovascular;  Laterality: N/A;  . Cardioversion  04/03/2011    Procedure: CARDIOVERSION;  Surgeon: Lelon Perla, MD;  Location: [facility] ENDOSCOPY;  Service: Cardiovascular;  Laterality: N/A;  . Tee without cardioversion  04/03/2011    Procedure: TRANSESOPHAGEAL ECHOCARDIOGRAM (TEE);  Surgeon: Lelon Perla, MD;  Location: [place];  Service: Cardiovascular;  Laterality: N/A;  . Cardioversion  04/03/2011    Procedure: CARDIOVERSION;  Surgeon: Lelon Perla, MD;  Location: [facility] OR;  Service: Cardiovascular;  Laterality: N/A;     Current Outpatient Prescriptions  Medication Sig Dispense Refill  . amiodarone (PACERONE) 200 MG tablet Take 1 tablet (200 mg total) by mouth daily. 30 tablet 11  . apixaban (ELIQUIS) 5 MG TABS tablet Take 1 tablet (5 mg total) by mouth 2 (two) times daily. 60 tablet 11  . Blood Glucose Monitoring Suppl (BAYER CONTOUR MONITOR) W/DEVICE KIT 1 each by Does not apply route once. Use twice a day to check blood sugar 1 kit 0  . CARTIA XT 120 MG 24 hr capsule Take 1 capsule (120 mg total) by mouth daily. 30 capsule 10  . furosemide (LASIX) 40 MG tablet Take 1 tablet (40 mg total) by mouth 2 (two) times daily. 60 tablet 11  . glucose blood (BAYER CONTOUR TEST) test strip Use as instructed to check blood sugar twice a day 100 each 3  . metFORMIN (GLUCOPHAGE) 1000 MG tablet  take 1 tablet by mouth twice a day with food 60 tablet 0  . metoprolol succinate (TOPROL-XL) 25 MG 24 hr tablet Take 1 tablet (25 mg total) by mouth 2 (two) times daily. 60 tablet 10  . potassium chloride SA (K-DUR,KLOR-CON) 20 MEQ tablet Take 1 tablet (20 mEq total) by mouth daily. 30 tablet 11   No current facility-administered medications for this visit.    Allergies:   Review of patient's allergies indicates no known allergies.    Social History:  The patient  reports that he has never smoked. He does not have any smokeless tobacco history on file. He reports that he drinks alcohol. He reports that he does not use illicit drugs.   Family History:  The patient's family history includes Diabetes in his father; Hypertension in his father, paternal grandfather, and sister; Seizures in his mother. There  is no history of Heart attack or Stroke.    ROS:   Please see the history of present illness.   Review of Systems  Cardiovascular: Positive for irregular heartbeat.  All other systems reviewed and are negative.     PHYSICAL EXAM: VS:  BP 120/80 mmHg  Pulse 74  Ht 6' (1.829 m)  Wt 332 lb (150.594 kg)  BMI 45.02 kg/m2    Wt Readings from Last 3 Encounters:  05/15/14 332 lb (150.594 kg)  04/19/14 328 lb (148.78 kg)  04/02/14 355 lb (161.027 kg)     GEN: Well nourished, well developed, in no acute distress HEENT: normal Neck: no JVD, no masses Cardiac:  Normal S1/S2, irreg irreg rhythm; no murmur, no rubs or gallops, trace edema  Respiratory:  clear to auscultation bilaterally, no wheezing, rhonchi or rales. GI: soft, nontender, nondistended, + BS MS: no deformity or atrophy Skin: warm and dry  Neuro:  CNs II-XII intact, Strength and sensation are intact Psych: Normal affect   EKG:  EKG is ordered today.  It demonstrates:   AFib, HR 74   Recent Labs: 04/02/2014: B Natriuretic Peptide 144.3* 04/03/2014: ALT 57*; Magnesium 1.9; TSH 1.238 04/08/2014: Hemoglobin 17.4*;  Platelets 240 04/19/2014: BUN 14; Creatinine 0.92; Potassium 3.8; Sodium 136    Lipid Panel    Component Value Date/Time   CHOL 167 02/11/2013 1527   TRIG 131.0 02/11/2013 1527   HDL 43.20 02/11/2013 1527   CHOLHDL 4 02/11/2013 1527   VLDL 26.2 02/11/2013 1527   LDLCALC 98 02/11/2013 1527      ASSESSMENT AND PLAN:  Chronic combined systolic and diastolic heart failure Overall, volume remained stable on current dose of Lasix. Obtain follow-up basic metabolic panel today.  Persistent atrial fibrillation   Plan was to try to set him up for cardioversion at this visit. However, the patient has missed several doses of Eliquis since last being seen. We had a long discussion about the importance of taking Eliquis twice a day everyday. I will again try to refer him to the CVRR clinic for further education. I will set him up for follow-up with Dr. [name] in the next several weeks. If he remains in atrial fibrillation at that time and we can confirm that he has been taking his Eliquis as prescribed, we can consider cardioversion. He had difficulty with the TEE during a prior hospitalization and became combative. He had a TEE cardioversion at that time under general anesthesia. At this point, I do not think we need to pursue TEE guided cardioversion under general anesthesia.  Lung Nodule FU Chest CT is pending next month.  Dilated cardiomyopathy   Probably related to RVR with AFib.  Continue beta-blocker. If EF does not improve with NSR, consider adding ACEI.    OSA - C-pap   Continue CPAP.  Type 2 diabetes mellitus without complication Refer to PCP.  Current medicines are reviewed at length with the patient today.  The patient does not have concerns regarding medicines.  The following changes have been made:  As above.    Labs/ tests ordered today include:  Orders Placed This Encounter  Procedures  . Basic Metabolic Panel (BMET)  . EKG 12-Lead    Disposition:   FU with Dr. Loralie Champagne in [facility] 3-4 weeks.     Signed, Versie Starks, MHS 05/15/2014 10:29 AM    [facility] [address], [place], [place]  [address] Phone: [phone]; Fax: [phone]  ADDENDUM: Note patient does need repeat CT of chest to re-eval reticulonodular opacities in 3 months. Richardson Dopp, PA-C   05/15/2014 10:29 AM

## 2014-05-15 ENCOUNTER — Encounter: Payer: Self-pay | Admitting: Physician Assistant

## 2014-05-15 ENCOUNTER — Ambulatory Visit (INDEPENDENT_AMBULATORY_CARE_PROVIDER_SITE_OTHER): Payer: BLUE CROSS/BLUE SHIELD | Admitting: Physician Assistant

## 2014-05-15 VITALS — BP 120/80 | HR 74 | Ht 72.0 in | Wt 332.0 lb

## 2014-05-15 DIAGNOSIS — I4819 Other persistent atrial fibrillation: Secondary | ICD-10-CM

## 2014-05-15 DIAGNOSIS — G4733 Obstructive sleep apnea (adult) (pediatric): Secondary | ICD-10-CM

## 2014-05-15 DIAGNOSIS — I5042 Chronic combined systolic (congestive) and diastolic (congestive) heart failure: Secondary | ICD-10-CM | POA: Diagnosis not present

## 2014-05-15 DIAGNOSIS — I481 Persistent atrial fibrillation: Secondary | ICD-10-CM | POA: Diagnosis not present

## 2014-05-15 DIAGNOSIS — I420 Dilated cardiomyopathy: Secondary | ICD-10-CM

## 2014-05-15 DIAGNOSIS — E119 Type 2 diabetes mellitus without complications: Secondary | ICD-10-CM

## 2014-05-15 DIAGNOSIS — R911 Solitary pulmonary nodule: Secondary | ICD-10-CM

## 2014-05-15 LAB — BASIC METABOLIC PANEL
BUN: 13 mg/dL (ref 6–23)
CHLORIDE: 103 meq/L (ref 96–112)
CO2: 28 meq/L (ref 19–32)
CREATININE: 0.92 mg/dL (ref 0.40–1.50)
Calcium: 9.4 mg/dL (ref 8.4–10.5)
GFR: 89.09 mL/min (ref >60.00)
Glucose, Bld: 77 mg/dL (ref 70–99)
Potassium: 3.9 mEq/L (ref 3.5–5.1)
Sodium: 137 mEq/L (ref 135–145)

## 2014-05-15 NOTE — Patient Instructions (Addendum)
You must make sure that you take Eliquis (Apixaban) twice a day every day.  We cannot put your heart back in rhythm unless you are able to take this medicine twice a day every day for at least a month.  Try to take it every 12 hours.  YOU WILL NEED TO SEE OUR COUMADIN CLINIC ABOUT NEW ELIQUIS IN THE NEXT 1-2 WEEKS ; PENDING CARDIOVERSION.  Your physician recommends that you return for lab work in: [place] NEED TO FOLLOW UP WITH DR. [place] IN THE NEXT 3-4 WEEKS PER SCOTT WEAVER, PAC  PLEASE SCHEDULE YOUR NEW PT APPT WITH [facility] AT THE [place] OFFICE; REFERRAL IN SYSTEM ALREADY  YOU ARE SCHEDULED FOR CHEST CT TO BE DONE ON 07/04/14

## 2014-05-16 ENCOUNTER — Telehealth: Payer: Self-pay | Admitting: *Deleted

## 2014-05-16 NOTE — Telephone Encounter (Signed)
lmom lab work normal. If any questions call back [phone].

## 2014-05-18 ENCOUNTER — Other Ambulatory Visit: Payer: Self-pay | Admitting: Family Medicine

## 2014-05-18 ENCOUNTER — Ambulatory Visit (INDEPENDENT_AMBULATORY_CARE_PROVIDER_SITE_OTHER): Payer: BLUE CROSS/BLUE SHIELD | Admitting: Pharmacist Clinician (PhC)/ Clinical Pharmacy Specialist

## 2014-05-18 DIAGNOSIS — I481 Persistent atrial fibrillation: Secondary | ICD-10-CM | POA: Diagnosis not present

## 2014-05-18 NOTE — Patient Instructions (Signed)
Continue to take you Eliquis twice daily.  Make sure it becomes part of your daily routine.  Without this, you risk having a stroke.

## 2014-05-18 NOTE — Progress Notes (Signed)
Pt was started on Eliquis for atrial fibrillation in February 2016.    Reviewed patients medication list.  Pt is not currently on any combined P-gp and strong CYP3A4 inhibitors/inducers (ketoconazole, traconazole, ritonavir, carbamazepine, phenytoin, rifampin, St. John's wort).  Reviewed labs.  SCr 0.92 Weight 332 lb, CrCl- 92 (using IBW).  Dose appropriate based on CrCl.   Hgb and HCT Within Normal Limits  A full discussion of the nature of anticoagulants has been carried out.  A benefit/risk analysis has been presented to the patient, so that they understand the justification for choosing anticoagulation with Eliquis at this time.  The need for compliance is stressed.  Pt is aware to take the medication twice daily.  Side effects of potential bleeding are discussed, including unusual colored urine or stools, coughing up blood or coffee ground emesis, nose bleeds or serious fall or head trauma.  Discussed signs and symptoms of stroke. The patient should avoid any OTC items containing aspirin or ibuprofen.  Avoid alcohol consumption.   Call if any signs of abnormal bleeding.  Discussed financial obligations and resolved any difficulty in obtaining medication.  Next lab test test in 6 months.   Pt recently saw Richardson Dopp, has had problems with compliance, trying to always take evening dose 12 hours after first dose of day.  Explained that he needs to worry less about time of day and get onto a routine of taking morning meds with breakfast or shower or something specifically part of his morning routine.  Same with evening dose, take with dinner or bedtime rituals.  Pt voiced understanding

## 2014-05-22 ENCOUNTER — Telehealth: Payer: Self-pay | Admitting: Physician Assistant

## 2014-05-22 ENCOUNTER — Telehealth: Payer: Self-pay | Admitting: Pharmacist Clinician (PhC)/ Clinical Pharmacy Specialist

## 2014-05-22 NOTE — Telephone Encounter (Signed)
Error

## 2014-05-22 NOTE — Telephone Encounter (Signed)
Will leave free 30 day trial and $10.month copay card at front desk of [employer].  LMOM for patient

## 2014-05-22 NOTE — Telephone Encounter (Signed)
New Message        Pt calling stating that he is now supposed to be taking Eliquis 5mg  twice a day and the cost is too high. Pt states that his pharmacy said that our office can fax something to his pharmacy to help cover the cost of the medication. Call pt or [place] [address] for questions.

## 2014-06-05 ENCOUNTER — Other Ambulatory Visit: Payer: Self-pay | Admitting: *Deleted

## 2014-06-05 ENCOUNTER — Encounter (HOSPITAL_COMMUNITY): Payer: Self-pay | Admitting: *Deleted

## 2014-06-05 ENCOUNTER — Ambulatory Visit (HOSPITAL_COMMUNITY)
Admission: RE | Admit: 2014-06-05 | Discharge: 2014-06-05 | Disposition: A | Discharge status: Home / Self Care | Payer: BLUE CROSS/BLUE SHIELD | Source: Ambulatory Visit | Attending: Cardiology | Admitting: Cardiology

## 2014-06-05 VITALS — BP 132/90 | HR 85 | Wt 337.0 lb

## 2014-06-05 DIAGNOSIS — I481 Persistent atrial fibrillation: Secondary | ICD-10-CM

## 2014-06-05 DIAGNOSIS — I4819 Other persistent atrial fibrillation: Secondary | ICD-10-CM

## 2014-06-05 DIAGNOSIS — G4733 Obstructive sleep apnea (adult) (pediatric): Secondary | ICD-10-CM | POA: Diagnosis not present

## 2014-06-05 DIAGNOSIS — I4891 Unspecified atrial fibrillation: Secondary | ICD-10-CM | POA: Insufficient documentation

## 2014-06-05 DIAGNOSIS — I5042 Chronic combined systolic (congestive) and diastolic (congestive) heart failure: Secondary | ICD-10-CM | POA: Diagnosis not present

## 2014-06-05 DIAGNOSIS — Z8249 Family history of ischemic heart disease and other diseases of the circulatory system: Secondary | ICD-10-CM | POA: Insufficient documentation

## 2014-06-05 DIAGNOSIS — Z833 Family history of diabetes mellitus: Secondary | ICD-10-CM | POA: Diagnosis not present

## 2014-06-05 DIAGNOSIS — Z79899 Other long term (current) drug therapy: Secondary | ICD-10-CM | POA: Diagnosis not present

## 2014-06-05 DIAGNOSIS — E119 Type 2 diabetes mellitus without complications: Secondary | ICD-10-CM | POA: Diagnosis not present

## 2014-06-05 DIAGNOSIS — Z7902 Long term (current) use of antithrombotics/antiplatelets: Secondary | ICD-10-CM | POA: Insufficient documentation

## 2014-06-05 LAB — LIPID PANEL
Cholesterol: 191 mg/dL (ref 0–200)
HDL: 50 mg/dL (ref >39)
LDL Cholesterol: 121 mg/dL — ABNORMAL HIGH (ref 0–99)
TRIGLYCERIDES: 102 mg/dL (ref <150)
Total CHOL/HDL Ratio: 3.8 RATIO
VLDL: 20 mg/dL (ref 0–40)

## 2014-06-05 LAB — HEPATIC FUNCTION PANEL
ALT: 24 U/L (ref 0–53)
AST: 19 U/L (ref 0–37)
Albumin: 3.5 g/dL (ref 3.5–5.2)
Alkaline Phosphatase: 59 U/L (ref 39–117)
BILIRUBIN DIRECT: 0.1 mg/dL (ref 0.0–0.5)
Indirect Bilirubin: 0.5 mg/dL (ref 0.3–0.9)
Total Bilirubin: 0.6 mg/dL (ref 0.3–1.2)
Total Protein: 6.9 g/dL (ref 6.0–8.3)

## 2014-06-05 LAB — TSH: TSH: 1.353 u[IU]/mL (ref 0.350–4.500)

## 2014-06-05 LAB — BRAIN NATRIURETIC PEPTIDE: B Natriuretic Peptide: 75.9 pg/mL (ref 0.0–100.0)

## 2014-06-05 NOTE — Progress Notes (Signed)
Patient ID: Darius [name], male   DOB: 05/08/1955, 58 y.o.   MRN: [id number] PCP: Dr. Kim ([place])  58 yo with atrial fibrillation and chronic primarily diastolic CHF presents for cardiology followup.  Patient initially had atrial fibrillation 2013 and had TEE-guided DCCV.  He was lost to followup for a couple of years, then readmitted in 2/16 with atrial fibrillation/RVR and acute on chronic primarily diastolic CHF.  EF 45-50% by echo.  Patient was rate-controlled and started on Eliquis with plan for 1 month anticoagulation then cardioversion.  He initially missed a few Eliquis doses but has been taking it faithfully since the beginning of April.  He has been doing well in generally.  He remains in atrial fibrillation.  He does not get short of breath walking on flat ground.  No orthopnea/PND.  No chest pain.  He is able to mow his grass.  Diet is poor, says he at a pint of ice cream yesterday.  He drinks a lot of fluid.  No lightheadedness/syncope.  Weight is up 5 lbs.   Labs (4/16): K 3.9, creatinine 0.92  ECG: Atrial fibrillation in the 90s, poor RWP  PMH: 1. Atrial fibrillation: Paroxysmal.  He had TEE-DCCV in 2013.  Re-admitted with atrial fibrillation/RVR in 2/16.  2. Chronic primarily diastolic CHF: Cardiolite (1/15) with EF 51%, no ischemia or infarction.  Echo (2/16) with EF 45-50%, severe biatrial enlargement.  3. OSA: Uses CPAP.  4. Type II diabetes.   SH: Single, nonsmoker, lives in [place].  FH: Diabetes, HTN.  No cardiac disease that he knows of.   ROS: All systems reviewed and negative except as per HPI.   Current Outpatient Prescriptions  Medication Sig Dispense Refill  . amiodarone (PACERONE) 200 MG tablet Take 1 tablet (200 mg total) by mouth daily. 30 tablet 11  . apixaban (ELIQUIS) 5 MG TABS tablet Take 1 tablet (5 mg total) by mouth 2 (two) times daily. 60 tablet 11  . Blood Glucose Monitoring Suppl (BAYER CONTOUR MONITOR) W/DEVICE KIT 1 each by Does not apply route  once. Use twice a day to check blood sugar 1 kit 0  . CARTIA XT 120 MG 24 hr capsule Take 1 capsule (120 mg total) by mouth daily. 30 capsule 10  . furosemide (LASIX) 40 MG tablet Take 1 tablet (40 mg total) by mouth 2 (two) times daily. 60 tablet 11  . glucose blood (BAYER CONTOUR TEST) test strip Use as instructed to check blood sugar twice a day 100 each 3  . metFORMIN (GLUCOPHAGE) 1000 MG tablet take 1 tablet by mouth twice a day with food 60 tablet 0  . metoprolol succinate (TOPROL-XL) 25 MG 24 hr tablet Take 1 tablet (25 mg total) by mouth 2 (two) times daily. 60 tablet 10  . potassium chloride SA (K-DUR,KLOR-CON) 20 MEQ tablet Take 1 tablet (20 mEq total) by mouth daily. 30 tablet 11   No current facility-administered medications for this encounter.   BP 132/90 mmHg  Pulse 85  Wt 337 lb (152.862 kg)  SpO2 95% General: NAD Neck: Thick, no JVD, no thyromegaly or thyroid nodule.  Lungs: Clear to auscultation bilaterally with normal respiratory effort. CV: Nondisplaced PMI.  Heart irregular S1/S2, no S3/S4, no murmur.  Trace ankle edema.  No carotid bruit.  Normal pedal pulses.  Abdomen: Soft, nontender, no hepatosplenomegaly, no distention.  Skin: Intact without lesions or rashes.  Neurologic: Alert and oriented x 3.  Psych: Normal affect. Extremities: No clubbing or cyanosis.  HEENT: Normal.     Assessment/Plan: 1. Atrial fibrillation: Patient remains in atrial fibrillation today.  He is taking Eliquis 5 mg bid without missing doses since the beginning of April.  He is on amiodarone 200 mg daily.  - I will arrange for DCCV at the end of next week.  - Given amiodarone use, will check LFTs and TSH today.  2. Chronic primarily diastolic CHF: EF 45-50% on echo in 2/16.  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  He had a Cardiolite in 1/15 with no ischemic or infarction.  He is not volume overloaded on exam today.  Weight is up likely due to poor dietary choices.   -  Repeat echo after DCCV.  - Check BNP.  - Continue Lasix 40 mg bid.  3. OSA: Continue CPAP use.  4. Morbid obesity: We discussed dietary changes and increased activity to help with weight loss.   Darius [name] 06/05/2014    

## 2014-06-05 NOTE — Patient Instructions (Signed)
Labs today  Your physician has recommended that you have a Cardioversion (DCCV). Electrical Cardioversion uses a jolt of electricity to your heart either through paddles or wired patches attached to your chest. This is a controlled, usually prescheduled, procedure. Defibrillation is done under light anesthesia in the hospital, and you usually go home the day of the procedure. This is done to get your heart back into a normal rhythm. You are not awake for the procedure. Please see the instruction sheet given to you today.  SCHEDULED FOR Thursday 06/15/14, SEE INSTRUCTION SHEET  Your physician recommends that you schedule a follow-up appointment in: 3 weeks

## 2014-06-06 NOTE — Addendum Note (Signed)
Encounter addended by: Georga Kaufmann, CCT on: 06/06/2014  8:31 AM<BR>     Documentation filed: Charges VN

## 2014-06-09 ENCOUNTER — Telehealth (HOSPITAL_COMMUNITY): Payer: Self-pay | Admitting: Cardiology

## 2014-06-09 NOTE — Telephone Encounter (Signed)
Pt scheduled for DCCV with [name] on 06/15/14 Cpt code [phone] With pts current insurance [employer] No pre cert req'd

## 2014-06-15 ENCOUNTER — Encounter (HOSPITAL_COMMUNITY): Payer: Self-pay | Admitting: Critical Care Medicine

## 2014-06-15 ENCOUNTER — Ambulatory Visit (HOSPITAL_COMMUNITY): Payer: BLUE CROSS/BLUE SHIELD | Admitting: Anesthesiology

## 2014-06-15 ENCOUNTER — Ambulatory Visit (HOSPITAL_COMMUNITY)
Admission: RE | Admit: 2014-06-15 | Discharge: 2014-06-15 | Disposition: A | Discharge status: Home / Self Care | Payer: BLUE CROSS/BLUE SHIELD | Source: Ambulatory Visit | Attending: Cardiology | Admitting: Cardiology

## 2014-06-15 ENCOUNTER — Encounter (HOSPITAL_COMMUNITY)
Admission: RE | Disposition: A | Discharge status: Home / Self Care | Payer: Self-pay | Source: Ambulatory Visit | Attending: Cardiology

## 2014-06-15 DIAGNOSIS — I10 Essential (primary) hypertension: Secondary | ICD-10-CM | POA: Insufficient documentation

## 2014-06-15 DIAGNOSIS — I480 Paroxysmal atrial fibrillation: Secondary | ICD-10-CM | POA: Insufficient documentation

## 2014-06-15 DIAGNOSIS — E119 Type 2 diabetes mellitus without complications: Secondary | ICD-10-CM | POA: Insufficient documentation

## 2014-06-15 DIAGNOSIS — G4733 Obstructive sleep apnea (adult) (pediatric): Secondary | ICD-10-CM | POA: Diagnosis not present

## 2014-06-15 DIAGNOSIS — E669 Obesity, unspecified: Secondary | ICD-10-CM | POA: Insufficient documentation

## 2014-06-15 DIAGNOSIS — I5030 Unspecified diastolic (congestive) heart failure: Secondary | ICD-10-CM | POA: Diagnosis not present

## 2014-06-15 DIAGNOSIS — I4891 Unspecified atrial fibrillation: Secondary | ICD-10-CM | POA: Diagnosis present

## 2014-06-15 HISTORY — PX: CARDIOVERSION: SHX1299

## 2014-06-15 LAB — GLUCOSE, CAPILLARY: GLUCOSE-CAPILLARY: 110 mg/dL — AB (ref 70–99)

## 2014-06-15 SURGERY — CARDIOVERSION
Anesthesia: General

## 2014-06-15 MED ORDER — APIXABAN 5 MG PO TABS
5.0000 mg | ORAL_TABLET | Freq: Once | ORAL | Status: AC
  Administered 2014-06-15: 5 mg via ORAL
  Filled 2014-06-15: qty 1

## 2014-06-15 MED ORDER — LIDOCAINE HCL (CARDIAC) 20 MG/ML IV SOLN
INTRAVENOUS | Status: DC | PRN
  Administered 2014-06-15: 40 mg via INTRAVENOUS

## 2014-06-15 MED ORDER — LACTATED RINGERS IV SOLN
INTRAVENOUS | Status: DC | PRN
Start: 2014-06-15 — End: 2014-06-15
  Administered 2014-06-15: 11:00:00 via INTRAVENOUS

## 2014-06-15 MED ORDER — PROPOFOL 10 MG/ML IV BOLUS
INTRAVENOUS | Status: DC | PRN
  Administered 2014-06-15: 100 mg via INTRAVENOUS
  Administered 2014-06-15: 40 mg via INTRAVENOUS

## 2014-06-15 NOTE — Anesthesia Postprocedure Evaluation (Signed)
  Anesthesia Post-op Note  Patient: Darius [name]  Procedure(s) Performed: Procedure(s): CARDIOVERSION (N/A)  Patient Location: Endoscopy Unit  Anesthesia Type:General  Level of Consciousness: awake, alert  and oriented  Airway and Oxygen Therapy: Patient Spontanous Breathing  Post-op Pain: none  Post-op Assessment: Post-op Vital signs reviewed, Patient's Cardiovascular Status Stable, Respiratory Function Stable and Patent Airway  Post-op Vital Signs: Reviewed and stable  Last Vitals:  Filed Vitals:   06/15/14 1038  BP: 143/70  Pulse: 106  Temp: 36.6 C  Resp: 16  BP 128/66, HR 77, Sats 58% RR 17  Complications: No apparent anesthesia complications

## 2014-06-15 NOTE — H&P (View-Only) (Signed)
Patient ID: Darius [name], male   DOB: 1955-08-09, 59 y.o.   MRN: [id number] PCP: Dr. Maudie Mercury [facility])  59 yo with atrial fibrillation and chronic primarily diastolic CHF presents for cardiology followup.  Patient initially had atrial fibrillation 2013 and had TEE-guided DCCV.  He was lost to followup for a couple of years, then readmitted in 2/16 with atrial fibrillation/RVR and acute on chronic primarily diastolic CHF.  EF 45-50% by echo.  Patient was rate-controlled and started on Eliquis with plan for 1 month anticoagulation then cardioversion.  He initially missed a few Eliquis doses but has been taking it faithfully since the beginning of April.  He has been doing well in generally.  He remains in atrial fibrillation.  He does not get short of breath walking on flat ground.  No orthopnea/PND.  No chest pain.  He is able to mow his grass.  Diet is poor, says he at a pint of ice cream yesterday.  He drinks a lot of fluid.  No lightheadedness/syncope.  Weight is up 5 lbs.   Labs (4/16): K 3.9, creatinine 0.92  ECG: Atrial fibrillation in the 90s, poor RWP  PMH: 1. Atrial fibrillation: Paroxysmal.  He had TEE-DCCV in 2013.  Re-admitted with atrial fibrillation/RVR in 2/16.  2. Chronic primarily diastolic CHF: Cardiolite (1/15) with EF 51%, no ischemia or infarction.  Echo (2/16) with EF 45-50%, severe biatrial enlargement.  3. OSA: Uses CPAP.  4. Type II diabetes.   SH: Single, nonsmoker, lives in [place].  FH: Diabetes, HTN.  No cardiac disease that he knows of.   ROS: All systems reviewed and negative except as per HPI.   Current Outpatient Prescriptions  Medication Sig Dispense Refill  . amiodarone (PACERONE) 200 MG tablet Take 1 tablet (200 mg total) by mouth daily. 30 tablet 11  . apixaban (ELIQUIS) 5 MG TABS tablet Take 1 tablet (5 mg total) by mouth 2 (two) times daily. 60 tablet 11  . Blood Glucose Monitoring Suppl (BAYER CONTOUR MONITOR) W/DEVICE KIT 1 each by Does not apply route  once. Use twice a day to check blood sugar 1 kit 0  . CARTIA XT 120 MG 24 hr capsule Take 1 capsule (120 mg total) by mouth daily. 30 capsule 10  . furosemide (LASIX) 40 MG tablet Take 1 tablet (40 mg total) by mouth 2 (two) times daily. 60 tablet 11  . glucose blood (BAYER CONTOUR TEST) test strip Use as instructed to check blood sugar twice a day 100 each 3  . metFORMIN (GLUCOPHAGE) 1000 MG tablet take 1 tablet by mouth twice a day with food 60 tablet 0  . metoprolol succinate (TOPROL-XL) 25 MG 24 hr tablet Take 1 tablet (25 mg total) by mouth 2 (two) times daily. 60 tablet 10  . potassium chloride SA (K-DUR,KLOR-CON) 20 MEQ tablet Take 1 tablet (20 mEq total) by mouth daily. 30 tablet 11   No current facility-administered medications for this encounter.   BP 132/90 mmHg  Pulse 85  Wt 337 lb (152.862 kg)  SpO2 95% General: NAD Neck: Thick, no JVD, no thyromegaly or thyroid nodule.  Lungs: Clear to auscultation bilaterally with normal respiratory effort. CV: Nondisplaced PMI.  Heart irregular S1/S2, no S3/S4, no murmur.  Trace ankle edema.  No carotid bruit.  Normal pedal pulses.  Abdomen: Soft, nontender, no hepatosplenomegaly, no distention.  Skin: Intact without lesions or rashes.  Neurologic: Alert and oriented x 3.  Psych: Normal affect. Extremities: No clubbing or cyanosis.  HEENT: Normal.  Assessment/Plan: 1. Atrial fibrillation: Patient remains in atrial fibrillation today.  He is taking Eliquis 5 mg bid without missing doses since the beginning of April.  He is on amiodarone 200 mg daily.  - I will arrange for DCCV at the end of next week.  - Given amiodarone use, will check LFTs and TSH today.  2. Chronic primarily diastolic CHF: EF 55-25% on echo in 2/16.  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  He had a Cardiolite in 1/15 with no ischemic or infarction.  He is not volume overloaded on exam today.  Weight is up likely due to poor dietary choices.   -  Repeat echo after DCCV.  - Check BNP.  - Continue Lasix 40 mg bid.  3. OSA: Continue CPAP use.  4. Morbid obesity: We discussed dietary changes and increased activity to help with weight loss.   Loralie Champagne 06/05/2014

## 2014-06-15 NOTE — Anesthesia Procedure Notes (Signed)
Date/Time: 06/15/2014 11:10 AM Performed by: Merrilyn Puma B Pre-anesthesia Checklist: Patient identified, Patient being monitored, Emergency Drugs available, Timeout performed and Suction available Patient Re-evaluated:Patient Re-evaluated prior to inductionOxygen Delivery Method: Ambu bag Preoxygenation: Pre-oxygenation with 100% oxygen Intubation Type: IV induction Placement Confirmation: breath sounds checked- equal and bilateral Dental Injury: Teeth and Oropharynx as per pre-operative assessment

## 2014-06-15 NOTE — Transfer of Care (Signed)
Immediate Anesthesia Transfer of Care Note  Patient: Darius [name]  Procedure(s) Performed: Procedure(s): CARDIOVERSION (N/A)  Patient Location: Endoscopy Unit  Anesthesia Type:General  Level of Consciousness: awake, alert  and oriented  Airway & Oxygen Therapy: Patient Spontanous Breathing  Post-op Assessment: Report given to RN and Post -op Vital signs reviewed and stable  Post vital signs: Reviewed and stable  Last Vitals:  Filed Vitals:   06/15/14 1038  BP: 143/70  Pulse: 106  Temp: 36.6 C  Resp: 16    Complications: No apparent anesthesia complications

## 2014-06-15 NOTE — Discharge Instructions (Signed)
Electrical Cardioversion, Care After °Refer to this sheet in the next few weeks. These instructions provide you with information on caring for yourself after your procedure. Your health care provider may also give you more specific instructions. Your treatment has been planned according to current medical practices, but problems sometimes occur. Call your health care provider if you have any problems or questions after your procedure. °WHAT TO EXPECT AFTER THE PROCEDURE °After your procedure, it is typical to have the following sensations: °· Some redness on the skin where the shocks were delivered. If this is tender, a sunburn lotion or hydrocortisone cream may help. °· Possible return of an abnormal heart rhythm within hours or days after the procedure. °HOME CARE INSTRUCTIONS °· Take medicines only as directed by your health care provider. Be sure you understand how and when to take your medicine. °· Learn how to feel your pulse and check it often. °· Limit your activity for 48 hours after the procedure or as directed by your health care provider. °· Avoid or minimize caffeine and other stimulants as directed by your health care provider. °SEEK MEDICAL CARE IF: °· You feel like your heart is beating too fast or your pulse is not regular. °· You have any questions about your medicines. °· You have bleeding that will not stop. °SEEK IMMEDIATE MEDICAL CARE IF: °· You are dizzy or feel faint. °· It is hard to breathe or you feel short of breath. °· There is a change in discomfort in your chest. °· Your speech is slurred or you have trouble moving an arm or leg on one side of your body. °· You get a serious muscle cramp that does not go away. °· Your fingers or toes turn cold or blue. °Document Released: 11/17/2012 Document Revised: 06/13/2013 Document Reviewed: 11/17/2012 °ExitCare® Patient Information ©2015 [employer]. This information is not intended to replace advice given to you by your health care provider.  Make sure you discuss any questions you have with your health care provider. ° ° °Conscious Sedation, Adult, Care After °Refer to this sheet in the next few weeks. These instructions provide you with information on caring for yourself after your procedure. Your health care provider may also give you more specific instructions. Your treatment has been planned according to current medical practices, but problems sometimes occur. Call your health care provider if you have any problems or questions after your procedure. °WHAT TO EXPECT AFTER THE PROCEDURE  °After your procedure: °· You may feel sleepy, clumsy, and have poor balance for several hours. °· Vomiting may occur if you eat too soon after the procedure. °HOME CARE INSTRUCTIONS °· Do not participate in any activities where you could become injured for at least 24 hours. Do not: °¨ Drive. °¨ Swim. °¨ Ride a bicycle. °¨ Operate heavy machinery. °¨ Cook. °¨ Use power tools. °¨ Climb ladders. °¨ Work from a high place. °· Do not make important decisions or sign legal documents until you are improved. °· If you vomit, drink water, juice, or soup when you can drink without vomiting. Make sure you have little or no nausea before eating solid foods. °· Only take over-the-counter or prescription medicines for pain, discomfort, or fever as directed by your health care provider. °· Make sure you and your family fully understand everything about the medicines given to you, including what side effects may occur. °· You should not drink alcohol, take sleeping pills, or take medicines that cause drowsiness for at least 24   hours. °· If you smoke, do not smoke without supervision. °· If you are feeling better, you may resume normal activities 24 hours after you were sedated. °· Keep all appointments with your health care provider. °SEEK MEDICAL CARE IF: °· Your skin is pale or bluish in color. °· You continue to feel nauseous or vomit. °· Your pain is getting worse and is not  helped by medicine. °· You have bleeding or swelling. °· You are still sleepy or feeling clumsy after 24 hours. °SEEK IMMEDIATE MEDICAL CARE IF: °· You develop a rash. °· You have difficulty breathing. °· You develop any type of allergic problem. °· You have a fever. °MAKE SURE YOU: °· Understand these instructions. °· Will watch your condition. °· Will get help right away if you are not doing well or get worse. °Document Released: 11/17/2012 Document Reviewed: 11/17/2012 °Exit[employer]® Patient Information ©2015 [employer]. This information is not intended to replace advice given to you by your health care provider. Make sure you discuss any questions you have with your health care provider. ° °

## 2014-06-15 NOTE — Anesthesia Preprocedure Evaluation (Addendum)
Anesthesia Evaluation  Patient identified by MRN, date of birth, ID band Patient awake    Reviewed: Allergy & Precautions, NPO status , Patient's Chart, lab work & pertinent test results  Airway Mallampati: III   Neck ROM: Full  Mouth opening: Limited Mouth Opening  Dental  (+) Poor Dentition   Pulmonary sleep apnea ,  breath sounds clear to auscultation        Cardiovascular hypertension, +CHF + dysrhythmias Atrial Fibrillation Rhythm:Irregular Rate:Normal     Neuro/Psych    GI/Hepatic   Endo/Other  diabetesMorbid obesity  Renal/GU      Musculoskeletal   Abdominal (+) + obese,   Peds  Hematology   Anesthesia Other Findings   Reproductive/Obstetrics                            Anesthesia Physical Anesthesia Plan  ASA: III  Anesthesia Plan: General   Post-op Pain Management:    Induction: Intravenous  Airway Management Planned: Mask  Additional Equipment:   Intra-op Plan:   Post-operative Plan:   Informed Consent: I have reviewed the patients History and Physical, chart, labs and discussed the procedure including the risks, benefits and alternatives for the proposed anesthesia with the patient or authorized representative who has indicated his/her understanding and acceptance.   Dental advisory given  Plan Discussed with: CRNA and Surgeon  Anesthesia Plan Comments:        Anesthesia Quick Evaluation

## 2014-06-15 NOTE — Procedures (Signed)
Electrical Cardioversion Procedure Note Darius [name] [id number] 1955-04-10  Procedure: Electrical Cardioversion Indications:  Atrial Fibrillation  Procedure Details Consent: Risks of procedure as well as the alternatives and risks of each were explained to the (patient/caregiver).  Consent for procedure obtained. Time Out: Verified patient identification, verified procedure, site/side was marked, verified correct patient position, special equipment/implants available, medications/allergies/relevent history reviewed, required imaging and test results available.  Performed  Patient placed on cardiac monitor, pulse oximetry, supplemental oxygen as necessary.  Sedation given: Propofol Pacer pads placed anterior and posterior chest.  Cardioverted 3 time(s).  Cardioverted at 200J with sternal pressure.   Evaluation Findings: Post procedure EKG shows: NSR Complications: None Patient did tolerate procedure well.  Continue Eliquis and amiodarone.   Loralie Champagne 06/15/2014, 11:15 AM

## 2014-06-15 NOTE — Interval H&P Note (Signed)
History and Physical Interval Note:  06/15/2014 11:05 AM  Darius [name]  has presented today for surgery, with the diagnosis of afib  The various methods of treatment have been discussed with the patient and family. After consideration of risks, benefits and other options for treatment, the patient has consented to  Procedure(s): CARDIOVERSION (N/A) as a surgical intervention .  The patient's history has been reviewed, patient examined, no change in status, stable for surgery.  I have reviewed the patient's chart and labs.  Questions were answered to the patient's satisfaction.     Richa Shor [employer]

## 2014-06-16 ENCOUNTER — Encounter (HOSPITAL_COMMUNITY): Payer: Self-pay | Admitting: Cardiology

## 2014-06-27 ENCOUNTER — Encounter (HOSPITAL_COMMUNITY): Payer: BLUE CROSS/BLUE SHIELD

## 2014-07-04 ENCOUNTER — Inpatient Hospital Stay: Admission: RE | Admit: 2014-07-04 | Payer: BLUE CROSS/BLUE SHIELD | Source: Ambulatory Visit

## 2014-07-27 ENCOUNTER — Other Ambulatory Visit: Payer: Self-pay | Admitting: Family Medicine

## 2014-09-26 ENCOUNTER — Encounter: Payer: Self-pay | Admitting: Pulmonary Disease

## 2014-10-13 ENCOUNTER — Other Ambulatory Visit: Payer: Self-pay | Admitting: Family Medicine

## 2014-11-22 ENCOUNTER — Ambulatory Visit: Payer: BLUE CROSS/BLUE SHIELD | Admitting: Pulmonary Disease

## 2015-09-18 ENCOUNTER — Encounter (HOSPITAL_COMMUNITY): Payer: Self-pay

## 2015-09-18 DIAGNOSIS — I420 Dilated cardiomyopathy: Secondary | ICD-10-CM | POA: Diagnosis present

## 2015-09-18 DIAGNOSIS — E119 Type 2 diabetes mellitus without complications: Secondary | ICD-10-CM | POA: Diagnosis present

## 2015-09-18 DIAGNOSIS — L03113 Cellulitis of right upper limb: Secondary | ICD-10-CM | POA: Diagnosis present

## 2015-09-18 DIAGNOSIS — G4733 Obstructive sleep apnea (adult) (pediatric): Secondary | ICD-10-CM | POA: Diagnosis present

## 2015-09-18 DIAGNOSIS — Z833 Family history of diabetes mellitus: Secondary | ICD-10-CM

## 2015-09-18 DIAGNOSIS — Z79899 Other long term (current) drug therapy: Secondary | ICD-10-CM

## 2015-09-18 DIAGNOSIS — I481 Persistent atrial fibrillation: Secondary | ICD-10-CM | POA: Diagnosis present

## 2015-09-18 DIAGNOSIS — Z7901 Long term (current) use of anticoagulants: Secondary | ICD-10-CM

## 2015-09-18 DIAGNOSIS — E871 Hypo-osmolality and hyponatremia: Secondary | ICD-10-CM | POA: Diagnosis present

## 2015-09-18 DIAGNOSIS — I5032 Chronic diastolic (congestive) heart failure: Secondary | ICD-10-CM | POA: Diagnosis present

## 2015-09-18 DIAGNOSIS — M71121 Other infective bursitis, right elbow: Principal | ICD-10-CM | POA: Diagnosis present

## 2015-09-18 DIAGNOSIS — Z8249 Family history of ischemic heart disease and other diseases of the circulatory system: Secondary | ICD-10-CM

## 2015-09-18 DIAGNOSIS — Z7984 Long term (current) use of oral hypoglycemic drugs: Secondary | ICD-10-CM

## 2015-09-18 DIAGNOSIS — Z9119 Patient's noncompliance with other medical treatment and regimen: Secondary | ICD-10-CM

## 2015-09-18 DIAGNOSIS — E872 Acidosis: Secondary | ICD-10-CM | POA: Diagnosis present

## 2015-09-18 NOTE — ED Triage Notes (Signed)
Pt sent here from urgent care for cellulitis of right forearm/elbow.  Pt hit elbow on part of deck about 1 1/2 weeks ago.  Was seen yesterday in u/c yesterday was prescribed antibiotics.  U/c marked elbow, today redness has not spread past marked area.  Clear drainage from elbow area.

## 2015-09-19 ENCOUNTER — Inpatient Hospital Stay (HOSPITAL_COMMUNITY)
Admission: EM | Admit: 2015-09-19 | Discharge: 2015-09-21 | DRG: 558 | Disposition: A | Discharge status: Home / Self Care | Payer: Self-pay | Attending: Internal Medicine | Admitting: Internal Medicine

## 2015-09-19 ENCOUNTER — Emergency Department (HOSPITAL_COMMUNITY): Payer: Self-pay

## 2015-09-19 ENCOUNTER — Encounter (HOSPITAL_COMMUNITY)
Admission: EM | Disposition: A | Discharge status: Home / Self Care | Payer: Self-pay | Source: Home / Self Care | Attending: Internal Medicine

## 2015-09-19 ENCOUNTER — Inpatient Hospital Stay (HOSPITAL_COMMUNITY): Payer: Self-pay | Admitting: Certified Registered Nurse Anesthetist

## 2015-09-19 ENCOUNTER — Encounter (HOSPITAL_COMMUNITY): Payer: Self-pay | Admitting: *Deleted

## 2015-09-19 DIAGNOSIS — B9689 Other specified bacterial agents as the cause of diseases classified elsewhere: Secondary | ICD-10-CM

## 2015-09-19 DIAGNOSIS — I5032 Chronic diastolic (congestive) heart failure: Secondary | ICD-10-CM | POA: Diagnosis present

## 2015-09-19 DIAGNOSIS — Z9119 Patient's noncompliance with other medical treatment and regimen: Secondary | ICD-10-CM

## 2015-09-19 DIAGNOSIS — E872 Acidosis, unspecified: Secondary | ICD-10-CM

## 2015-09-19 DIAGNOSIS — I5042 Chronic combined systolic (congestive) and diastolic (congestive) heart failure: Secondary | ICD-10-CM | POA: Diagnosis present

## 2015-09-19 DIAGNOSIS — I5043 Acute on chronic combined systolic (congestive) and diastolic (congestive) heart failure: Secondary | ICD-10-CM | POA: Diagnosis present

## 2015-09-19 DIAGNOSIS — Z91199 Patient's noncompliance with other medical treatment and regimen due to unspecified reason: Secondary | ICD-10-CM

## 2015-09-19 DIAGNOSIS — I481 Persistent atrial fibrillation: Secondary | ICD-10-CM

## 2015-09-19 DIAGNOSIS — I4819 Other persistent atrial fibrillation: Secondary | ICD-10-CM | POA: Diagnosis present

## 2015-09-19 DIAGNOSIS — M71121 Other infective bursitis, right elbow: Secondary | ICD-10-CM | POA: Diagnosis present

## 2015-09-19 DIAGNOSIS — L03113 Cellulitis of right upper limb: Secondary | ICD-10-CM

## 2015-09-19 DIAGNOSIS — M7021 Olecranon bursitis, right elbow: Secondary | ICD-10-CM

## 2015-09-19 DIAGNOSIS — E119 Type 2 diabetes mellitus without complications: Secondary | ICD-10-CM

## 2015-09-19 HISTORY — PX: I & D EXTREMITY: SHX5045

## 2015-09-19 LAB — BASIC METABOLIC PANEL
ANION GAP: 8 (ref 5–15)
Anion gap: 16 — ABNORMAL HIGH (ref 5–15)
BUN: 14 mg/dL (ref 6–20)
BUN: 9 mg/dL (ref 6–20)
CALCIUM: 8.7 mg/dL — AB (ref 8.9–10.3)
CHLORIDE: 96 mmol/L — AB (ref 101–111)
CO2: 20 mmol/L — AB (ref 22–32)
CO2: 26 mmol/L (ref 22–32)
Calcium: 8.4 mg/dL — ABNORMAL LOW (ref 8.9–10.3)
Chloride: 91 mmol/L — ABNORMAL LOW (ref 101–111)
Creatinine, Ser: 1.02 mg/dL (ref 0.61–1.24)
Creatinine, Ser: 0.8 mg/dL (ref 0.61–1.24)
Glucose, Bld: 274 mg/dL — ABNORMAL HIGH (ref 65–99)
Glucose, Bld: 191 mg/dL — ABNORMAL HIGH (ref 65–99)
POTASSIUM: 3 mmol/L — AB (ref 3.5–5.1)
Potassium: 3.5 mmol/L (ref 3.5–5.1)
SODIUM: 130 mmol/L — AB (ref 135–145)
Sodium: 127 mmol/L — ABNORMAL LOW (ref 135–145)

## 2015-09-19 LAB — GLUCOSE, CAPILLARY
GLUCOSE-CAPILLARY: 242 mg/dL — AB (ref 65–99)
GLUCOSE-CAPILLARY: 365 mg/dL — AB (ref 65–99)
GLUCOSE-CAPILLARY: 133 mg/dL — AB (ref 65–99)
GLUCOSE-CAPILLARY: 244 mg/dL — AB (ref 65–99)
Glucose-Capillary: 116 mg/dL — ABNORMAL HIGH (ref 65–99)

## 2015-09-19 LAB — CBC WITH DIFFERENTIAL/PLATELET
Basophils Absolute: 0 10*3/uL (ref 0.0–0.1)
Basophils Relative: 0 %
Eosinophils Absolute: 0.1 10*3/uL (ref 0.0–0.7)
Eosinophils Relative: 1 %
HEMATOCRIT: 42.9 % (ref 39.0–52.0)
Hemoglobin: 15.6 g/dL (ref 13.0–17.0)
LYMPHS ABS: 2.7 10*3/uL (ref 0.7–4.0)
Lymphocytes Relative: 23 %
MCH: 30.3 pg (ref 26.0–34.0)
MCHC: 36.4 g/dL — ABNORMAL HIGH (ref 30.0–36.0)
MCV: 83.3 fL (ref 78.0–100.0)
MONOS PCT: 14 %
Monocytes Absolute: 1.7 10*3/uL — ABNORMAL HIGH (ref 0.1–1.0)
Neutro Abs: 7.3 10*3/uL (ref 1.7–7.7)
Neutrophils Relative %: 62 %
PLATELETS: 248 10*3/uL (ref 150–400)
RBC: 5.15 MIL/uL (ref 4.22–5.81)
RDW: 12.3 % (ref 11.5–15.5)
WBC: 11.8 10*3/uL — AB (ref 4.0–10.5)

## 2015-09-19 LAB — URINALYSIS, ROUTINE W REFLEX MICROSCOPIC
Bilirubin Urine: NEGATIVE
Glucose, UA: >1000 mg/dL — AB
HGB URINE DIPSTICK: NEGATIVE
Ketones, ur: >80 mg/dL — AB
Leukocytes, UA: NEGATIVE
Nitrite: NEGATIVE
PROTEIN: NEGATIVE mg/dL
Specific Gravity, Urine: 1.03 (ref 1.005–1.030)
pH: 5.5 (ref 5.0–8.0)

## 2015-09-19 LAB — URINE MICROSCOPIC-ADD ON

## 2015-09-19 LAB — LACTIC ACID, PLASMA: LACTIC ACID, VENOUS: 2 mmol/L — AB (ref 0.5–1.9)

## 2015-09-19 LAB — SURGICAL PCR SCREEN
MRSA, PCR: NEGATIVE
STAPHYLOCOCCUS AUREUS: NEGATIVE

## 2015-09-19 LAB — I-STAT CG4 LACTIC ACID, ED: LACTIC ACID, VENOUS: 2.74 mmol/L — AB (ref 0.5–1.9)

## 2015-09-19 SURGERY — IRRIGATION AND DEBRIDEMENT EXTREMITY
Anesthesia: General | Site: Elbow | Laterality: Right

## 2015-09-19 MED ORDER — CEFAZOLIN SODIUM-DEXTROSE 2-4 GM/100ML-% IV SOLN
2.0000 g | INTRAVENOUS | Status: AC
  Administered 2015-09-19: 2 g via INTRAVENOUS
  Filled 2015-09-19 (×2): qty 100

## 2015-09-19 MED ORDER — OXYCODONE HCL 5 MG/5ML PO SOLN: 5.0000 mg | Freq: Once | ORAL | Status: DC | PRN

## 2015-09-19 MED ORDER — LACTATED RINGERS IV SOLN
INTRAVENOUS | Status: DC | PRN
  Administered 2015-09-19: 18:00:00 via INTRAVENOUS

## 2015-09-19 MED ORDER — CEFAZOLIN SODIUM-DEXTROSE 2-4 GM/100ML-% IV SOLN
2.0000 g | INTRAVENOUS | Status: DC
Start: 2015-09-19 — End: 2015-09-19

## 2015-09-19 MED ORDER — FENTANYL CITRATE (PF) 100 MCG/2ML IJ SOLN: 25.0000 ug | INTRAMUSCULAR | Status: DC | PRN

## 2015-09-19 MED ORDER — LIDOCAINE HCL (CARDIAC) 20 MG/ML IV SOLN
INTRAVENOUS | Status: DC | PRN
  Administered 2015-09-19: 100 mg via INTRAVENOUS

## 2015-09-19 MED ORDER — INSULIN ASPART 100 UNIT/ML ~~LOC~~ SOLN
0.0000 [IU] | Freq: Three times a day (TID) | SUBCUTANEOUS | Status: DC
  Administered 2015-09-20: 8 [IU] via SUBCUTANEOUS
  Administered 2015-09-20: 3 [IU] via SUBCUTANEOUS
  Administered 2015-09-20: 11 [IU] via SUBCUTANEOUS
  Administered 2015-09-21 (×2): 8 [IU] via SUBCUTANEOUS

## 2015-09-19 MED ORDER — SODIUM CHLORIDE 0.9 % IV SOLN
INTRAVENOUS | Status: DC
Start: 2015-09-19 — End: 2015-09-19

## 2015-09-19 MED ORDER — GLUCERNA SHAKE PO LIQD
237.0000 mL | ORAL | Status: DC
  Administered 2015-09-19 – 2015-09-20 (×2): 237 mL via ORAL

## 2015-09-19 MED ORDER — ADULT MULTIVITAMIN W/MINERALS CH
1.0000 | ORAL_TABLET | Freq: Every day | ORAL | Status: DC
  Administered 2015-09-19 – 2015-09-21 (×3): 1 via ORAL
  Filled 2015-09-19 (×3): qty 1

## 2015-09-19 MED ORDER — VANCOMYCIN HCL 10 G IV SOLR
1250.0000 mg | Freq: Two times a day (BID) | INTRAVENOUS | Status: DC
  Administered 2015-09-19 – 2015-09-21 (×4): 1250 mg via INTRAVENOUS
  Filled 2015-09-19 (×5): qty 1250

## 2015-09-19 MED ORDER — MIDAZOLAM HCL 5 MG/5ML IJ SOLN
INTRAMUSCULAR | Status: DC | PRN
  Administered 2015-09-19: 2 mg via INTRAVENOUS

## 2015-09-19 MED ORDER — VANCOMYCIN HCL IN DEXTROSE 1-5 GM/200ML-% IV SOLN
INTRAVENOUS | Status: AC
  Filled 2015-09-19: qty 200

## 2015-09-19 MED ORDER — ENSURE ENLIVE PO LIQD
237.0000 mL | Freq: Two times a day (BID) | ORAL | Status: DC
  Administered 2015-09-19: 237 mL via ORAL

## 2015-09-19 MED ORDER — OXYCODONE HCL 5 MG PO TABS
10.0000 mg | ORAL_TABLET | ORAL | Status: DC | PRN
  Administered 2015-09-19 – 2015-09-20 (×2): 10 mg via ORAL
  Filled 2015-09-19 (×2): qty 2

## 2015-09-19 MED ORDER — VANCOMYCIN HCL IN DEXTROSE 1-5 GM/200ML-% IV SOLN
1000.0000 mg | Freq: Once | INTRAVENOUS | Status: AC
  Administered 2015-09-19: 1000 mg via INTRAVENOUS

## 2015-09-19 MED ORDER — MIDAZOLAM HCL 2 MG/2ML IJ SOLN
INTRAMUSCULAR | Status: AC
  Filled 2015-09-19: qty 2

## 2015-09-19 MED ORDER — SODIUM CHLORIDE 0.9 % IR SOLN
Status: DC | PRN
  Administered 2015-09-19: 3000 mL

## 2015-09-19 MED ORDER — ASPIRIN EC 81 MG PO TBEC
81.0000 mg | DELAYED_RELEASE_TABLET | Freq: Every day | ORAL | Status: DC
  Administered 2015-09-19 – 2015-09-21 (×3): 81 mg via ORAL
  Filled 2015-09-19 (×3): qty 1

## 2015-09-19 MED ORDER — FENTANYL CITRATE (PF) 100 MCG/2ML IJ SOLN
INTRAMUSCULAR | Status: DC
Start: 2015-09-19 — End: 2015-09-19
  Filled 2015-09-19: qty 2

## 2015-09-19 MED ORDER — OXYCODONE HCL 5 MG PO TABS: 5.0000 mg | ORAL_TABLET | Freq: Once | ORAL | Status: DC | PRN

## 2015-09-19 MED ORDER — ONDANSETRON HCL 4 MG/2ML IJ SOLN
INTRAMUSCULAR | Status: DC | PRN
  Administered 2015-09-19: 4 mg via INTRAVENOUS

## 2015-09-19 MED ORDER — SODIUM CHLORIDE 0.9 % IV BOLUS (SEPSIS)
1000.0000 mL | Freq: Once | INTRAVENOUS | Status: AC
  Administered 2015-09-19: 1000 mL via INTRAVENOUS

## 2015-09-19 MED ORDER — FENTANYL CITRATE (PF) 250 MCG/5ML IJ SOLN
INTRAMUSCULAR | Status: AC
  Filled 2015-09-19: qty 5

## 2015-09-19 MED ORDER — POTASSIUM CHLORIDE CRYS ER 20 MEQ PO TBCR
40.0000 meq | EXTENDED_RELEASE_TABLET | ORAL | Status: AC
Start: 2015-09-19 — End: 2015-09-20
  Administered 2015-09-19 – 2015-09-20 (×3): 40 meq via ORAL
  Filled 2015-09-19 (×3): qty 2

## 2015-09-19 MED ORDER — ENOXAPARIN SODIUM 40 MG/0.4ML ~~LOC~~ SOLN
40.0000 mg | SUBCUTANEOUS | Status: DC
  Administered 2015-09-19 – 2015-09-20 (×2): 40 mg via SUBCUTANEOUS
  Filled 2015-09-19 (×3): qty 0.4

## 2015-09-19 MED ORDER — SODIUM CHLORIDE 0.9 % IR SOLN
Status: DC | PRN
  Administered 2015-09-19: 1000 mL

## 2015-09-19 MED ORDER — SODIUM CHLORIDE 0.9 % IV SOLN
INTRAVENOUS | Status: AC
  Administered 2015-09-19: 11:00:00 via INTRAVENOUS

## 2015-09-19 MED ORDER — INSULIN ASPART 100 UNIT/ML ~~LOC~~ SOLN
0.0000 [IU] | Freq: Three times a day (TID) | SUBCUTANEOUS | Status: DC
  Administered 2015-09-19: 15 [IU] via SUBCUTANEOUS
  Administered 2015-09-19: 5 [IU] via SUBCUTANEOUS

## 2015-09-19 MED ORDER — PROPOFOL 10 MG/ML IV BOLUS
INTRAVENOUS | Status: DC | PRN
  Administered 2015-09-19: 200 mg via INTRAVENOUS
  Administered 2015-09-19: 50 mg via INTRAVENOUS

## 2015-09-19 MED ORDER — PROMETHAZINE HCL 25 MG/ML IJ SOLN: 6.2500 mg | INTRAMUSCULAR | Status: DC | PRN

## 2015-09-19 MED ORDER — PROPOFOL 10 MG/ML IV BOLUS
INTRAVENOUS | Status: AC
  Filled 2015-09-19: qty 20

## 2015-09-19 MED ORDER — FENTANYL CITRATE (PF) 100 MCG/2ML IJ SOLN
INTRAMUSCULAR | Status: DC | PRN
  Administered 2015-09-19 (×3): 50 ug via INTRAVENOUS

## 2015-09-19 SURGICAL SUPPLY — 61 items
BANDAGE ACE 4X5 VEL STRL LF (GAUZE/BANDAGES/DRESSINGS) ×2 IMPLANT
BANDAGE ELASTIC 3 VELCRO ST LF (GAUZE/BANDAGES/DRESSINGS) IMPLANT
BLADE SURG 10 STRL SS (BLADE) ×1 IMPLANT
BNDG COHESIVE 1X5 TAN STRL LF (GAUZE/BANDAGES/DRESSINGS) IMPLANT
BNDG COHESIVE 4X5 TAN STRL (GAUZE/BANDAGES/DRESSINGS) ×3 IMPLANT
BNDG COHESIVE 6X5 TAN STRL LF (GAUZE/BANDAGES/DRESSINGS) ×2 IMPLANT
BNDG CONFORM 3 STRL LF (GAUZE/BANDAGES/DRESSINGS) IMPLANT
BNDG GAUZE STRTCH 6 (GAUZE/BANDAGES/DRESSINGS) ×3 IMPLANT
CORDS BIPOLAR (ELECTRODE) IMPLANT
COVER SURGICAL LIGHT HANDLE (MISCELLANEOUS) ×3 IMPLANT
CUFF TOURNIQUET SINGLE 18IN (TOURNIQUET CUFF) ×1 IMPLANT
CUFF TOURNIQUET SINGLE 24IN (TOURNIQUET CUFF) IMPLANT
CUFF TOURNIQUET SINGLE 34IN LL (TOURNIQUET CUFF) IMPLANT
CUFF TOURNIQUET SINGLE 44IN (TOURNIQUET CUFF) IMPLANT
DRAPE ORTHO SPLIT 77X108 STRL (DRAPES)
DRAPE SURG 17X23 STRL (DRAPES) IMPLANT
DRAPE SURG ORHT 6 SPLT 77X108 (DRAPES) ×2 IMPLANT
DRAPE U-SHAPE 47X51 STRL (DRAPES) ×3 IMPLANT
DRSG EMULSION OIL 3X3 NADH (GAUZE/BANDAGES/DRESSINGS) ×2 IMPLANT
DRSG PAD ABDOMINAL 8X10 ST (GAUZE/BANDAGES/DRESSINGS) ×2 IMPLANT
DURAPREP 26ML APPLICATOR (WOUND CARE) ×1 IMPLANT
ELECT CAUTERY BLADE 6.4 (BLADE) ×2 IMPLANT
ELECT REM PT RETURN 9FT ADLT (ELECTROSURGICAL) ×3
ELECTRODE REM PT RTRN 9FT ADLT (ELECTROSURGICAL) IMPLANT
GAUZE SPONGE 4X4 12PLY STRL (GAUZE/BANDAGES/DRESSINGS) ×3 IMPLANT
GAUZE XEROFORM 1X8 LF (GAUZE/BANDAGES/DRESSINGS) ×3 IMPLANT
GLOVE BIO SURGEON STRL SZ8 (GLOVE) ×1 IMPLANT
GLOVE BIOGEL PI IND STRL 8 (GLOVE) ×2 IMPLANT
GLOVE BIOGEL PI INDICATOR 8 (GLOVE) ×2
GLOVE ORTHO TXT STRL SZ7.5 (GLOVE) ×3 IMPLANT
GOWN STRL REUS W/ TWL LRG LVL3 (GOWN DISPOSABLE) ×1 IMPLANT
GOWN STRL REUS W/ TWL XL LVL3 (GOWN DISPOSABLE) ×4 IMPLANT
GOWN STRL REUS W/TWL LRG LVL3 (GOWN DISPOSABLE) ×3
GOWN STRL REUS W/TWL XL LVL3 (GOWN DISPOSABLE) ×3
HANDPIECE INTERPULSE COAX TIP (DISPOSABLE) ×3
KIT BASIN OR (CUSTOM PROCEDURE TRAY) ×3 IMPLANT
KIT ROOM TURNOVER OR (KITS) ×3 IMPLANT
MANIFOLD NEPTUNE II (INSTRUMENTS) ×3 IMPLANT
NS IRRIG 1000ML POUR BTL (IV SOLUTION) ×3 IMPLANT
PACK ORTHO EXTREMITY (CUSTOM PROCEDURE TRAY) ×3 IMPLANT
PAD ARMBOARD 7.5X6 YLW CONV (MISCELLANEOUS) ×4 IMPLANT
PAD CAST 4YDX4 CTTN HI CHSV (CAST SUPPLIES) IMPLANT
PADDING CAST ABS 4INX4YD NS (CAST SUPPLIES) ×2
PADDING CAST ABS COTTON 4X4 ST (CAST SUPPLIES) ×2 IMPLANT
PADDING CAST COTTON 4X4 STRL (CAST SUPPLIES) ×3
PADDING CAST COTTON 6X4 STRL (CAST SUPPLIES) ×1 IMPLANT
SET HNDPC FAN SPRY TIP SCT (DISPOSABLE) IMPLANT
SPONGE LAP 18X18 X RAY DECT (DISPOSABLE) ×1 IMPLANT
STOCKINETTE IMPERVIOUS 9X36 MD (GAUZE/BANDAGES/DRESSINGS) ×3 IMPLANT
SUT ETHILON 2 0 FS 18 (SUTURE) ×15 IMPLANT
SUT ETHILON 3 0 PS 1 (SUTURE) ×2 IMPLANT
SWAB COLLECTION DEVICE MRSA (MISCELLANEOUS) ×2 IMPLANT
SYR CONTROL 10ML LL (SYRINGE) IMPLANT
TOWEL OR 17X24 6PK STRL BLUE (TOWEL DISPOSABLE) ×3 IMPLANT
TOWEL OR 17X26 10 PK STRL BLUE (TOWEL DISPOSABLE) ×3 IMPLANT
TUBE ANAEROBIC SPECIMEN COL (MISCELLANEOUS) ×2 IMPLANT
TUBE CONNECTING 12'X1/4 (SUCTIONS) ×1
TUBE CONNECTING 12X1/4 (SUCTIONS) ×2 IMPLANT
TUBE FEEDING 5FR 15 INCH (TUBING) IMPLANT
UNDERPAD 30X30 INCONTINENT (UNDERPADS AND DIAPERS) ×1 IMPLANT
YANKAUER SUCT BULB TIP NO VENT (SUCTIONS) ×3 IMPLANT

## 2015-09-19 NOTE — Op Note (Signed)
NAMENORMAL, GRESS               ACCOUNT NO.:  [id number]  MEDICAL RECORD NO.:  [id number]  LOCATION:  [id number]                        FACILITY:  [facility]  PHYSICIAN:  Lind Guest. Ninfa Linden, M.D.DATE OF BIRTH:  1955/11/08  DATE OF PROCEDURE:  09/19/2015 DATE OF DISCHARGE:                              OPERATIVE REPORT   PREOPERATIVE DIAGNOSIS:  Infected olecranon bursitis, right elbow.  POSTOPERATIVE DIAGNOSIS:  Infected olecranon bursitis, right elbow.  PROCEDURE:  Irrigation and debridement of right elbow infected olecranon bursa.  FINDINGS:  Abundant gross purulence all around the right elbow olecranon area.  SURGEON:  Lind Guest. Ninfa Linden, M.D.  ANESTHESIA:  General.  BLOOD LOSS:  Less than 50 mL.  COMPLICATIONS:  None.  INDICATIONS:  Mr. Cabadas is a very pleasant 60 year old gentleman, who is a diabetic.  He injured his right elbow hitting it against something about a week ago.  He had abundant redness and swelling and worsening pain over the last several days and presented to the ER late last night or early this morning.  He was admitted to the Medicine Service with cellulitis of the elbow and Orthopedics was consulted due to likely infected olecranon bursa.  At the bedside, I was able to aspirate about 20 mL of grossly purulent material from the olecranon area.  With that being said, I recommended an open debridement of this area.  The risks and benefits were explained to him in detail and he did wish to proceed with surgery.  PROCEDURE DESCRIPTION:  After informed consent was obtained, appropriate right arm was marked.  He was brought to the operating room, placed supine on the operating table.  General anesthesia was then obtained. His right elbow was prepped and draped with Betadine scrub and paint. Time-out was called to identify correct patient, correct right elbow.  I then made Ward's incision directly over the olecranon bursa and carried this proximally  and distally.  I encountered gross purulence as well as a hematoma.  We suctioned it out completely and then irrigated with 3 L of normal saline solution through the elbow.  This was basically irrigation, and I did not have to sharply debride anything.  After I irrigated the fluid through the tissue, I loosely reapproximated the skin with interrupted nylon suture.  Xeroform and well-padded sterile dressing was applied.  He was awakened, extubated, and taken to the recovery room in stable condition.  All final counts were correct. There were no complications noted.  Postoperatively, he will need to remain on IV antibiotics for at least the next 24 to 48 hours to allow the cellulitis to improve before sending him out on oral antibiotics.     Lind Guest. Ninfa Linden, M.D.     CYB/MEDQ  D:  09/19/2015  T:  09/19/2015  Job:  [id number]

## 2015-09-19 NOTE — Brief Op Note (Signed)
09/19/2015  6:43 PM  PATIENT:  Aleen Sells  60 y.o. male  PRE-OPERATIVE DIAGNOSIS:  Right Elbow Bursa Infection   POST-OPERATIVE DIAGNOSIS:  Right Elbow Bursa Infection   PROCEDURE:  Procedure(s): IRRIGATION AND DEBRIDEMENT EXTREMITY (Right)  SURGEON:  Surgeon(s) and Role:    * Mcarthur Rossetti, MD - Primary  ANESTHESIA:   general  EBL:  Total I/O In: 1837 [P.O.:617; I.V.:970; IV Piggyback:250] Out: 10 [Blood:10]  COUNTS:  YES  DICTATION: .Other Dictation: Dictation Number [id number]  PLAN OF CARE: Admit to inpatient   PATIENT DISPOSITION:  PACU - hemodynamically stable.   Delay start of Pharmacological VTE agent (>24hrs) due to surgical blood loss or risk of bleeding: no

## 2015-09-19 NOTE — Progress Notes (Signed)
CRITICAL VALUE ALERT  Critical value received:  2.0   Date of notification:  09/19/15  Time of notification:  0928  Critical value read back: yes  Nurse who received alert:  Glean Hess RN  MD notified (1st page):  Eliseo Squires  Time of first page:  1000  MD notified (2nd page):  Time of second page:  Responding MD:  Eliseo Squires  Time MD responded:  1000

## 2015-09-19 NOTE — ED Notes (Signed)
Admitting doctor at the bedside 

## 2015-09-19 NOTE — Anesthesia Postprocedure Evaluation (Signed)
Anesthesia Post Note  Patient: Darius [name]  Procedure(s) Performed: Procedure(s) (LRB): IRRIGATION AND DEBRIDEMENT EXTREMITY (Right)  Patient location during evaluation: PACU Anesthesia Type: General Level of consciousness: sedated and patient cooperative Pain management: pain level controlled Vital Signs Assessment: post-procedure vital signs reviewed and stable Respiratory status: spontaneous breathing Cardiovascular status: stable Anesthetic complications: no    Last Vitals:  Vitals:   09/19/15 1900 09/19/15 2059  BP: 117/71 (!) 110/58  Pulse: 77 93  Resp: 18 16  Temp: 36.4 C 36.8 C    Last Pain:  Vitals:   09/19/15 2059  TempSrc: Oral  PainSc:                  Nolon Nations

## 2015-09-19 NOTE — Progress Notes (Deleted)
Received consult for medication assistance; patient has private insurance with [employer] with prescription drug coverage; CM unable to assist due to private insurance.  Coverage Information   Coverage information:    Subscriber: [id number] [name],Darius    Rel to sub: 01 - Self    Member ID: [id number]    Payor: [id number] CROSS BLUE SHIELD    Benefit plan: [id number] OTHER Ph: [phone]    Group number: [id number]    Member effective dates: from 02/10/14

## 2015-09-19 NOTE — Anesthesia Preprocedure Evaluation (Signed)
Anesthesia Evaluation  Patient identified by MRN, date of birth, ID band Patient awake    Reviewed: Allergy & Precautions, H&P , NPO status , Patient's Chart, lab work & pertinent test results, reviewed documented beta blocker date and time   Airway Mallampati: III  TM Distance: >3 FB Neck ROM: Full  Mouth opening: Limited Mouth Opening  Dental no notable dental hx. (+) Poor Dentition   Pulmonary sleep apnea ,    Pulmonary exam normal breath sounds clear to auscultation       Cardiovascular hypertension, +CHF  + dysrhythmias Atrial Fibrillation  Rhythm:Irregular Rate:Normal     Neuro/Psych    GI/Hepatic   Endo/Other  diabetesMorbid obesity  Renal/GU      Musculoskeletal   Abdominal (+) + obese,   Peds  Hematology   Anesthesia Other Findings Morbid obesity (HCC)    Atrial fibrillation (HCC)   Chronic diastolic heart failure    Echocardiogram (02/2013): EF 60-65%, Gr 2 DD mildly dilated Ao root (Ao root dimension 39 mm)  mild LAE, normal RVFHx of cardiovascular stress test   Lexiscan Myoview (02/2013): No ischemia or scar, EF 51%, low     Sleep apnea  : very severly OSA Diabetes mellitus, type 2    09/19/15 Low Na 130             Low K 3.0     Reproductive/Obstetrics                             Anesthesia Physical  Anesthesia Plan  ASA: III  Anesthesia Plan: General   Post-op Pain Management:    Induction: Intravenous  Airway Management Planned: LMA  Additional Equipment:   Intra-op Plan:   Post-operative Plan:   Informed Consent: I have reviewed the patients History and Physical, chart, labs and discussed the procedure including the risks, benefits and alternatives for the proposed anesthesia with the patient or authorized representative who has indicated his/her understanding and acceptance.   Dental advisory given  Plan Discussed with: CRNA and Surgeon  Anesthesia  Plan Comments:         Anesthesia Quick Evaluation

## 2015-09-19 NOTE — ED Notes (Signed)
MD in room

## 2015-09-19 NOTE — ED Provider Notes (Signed)
[facility] DEPT Provider Note   CSN: [id number] Arrival date & time: 09/18/15  2017  First Provider Contact:   First MD Initiated Contact with Patient 09/19/15 0153      By signing my name below, I, Darius [name] and Darius [name], attest that this documentation has been prepared under the direction and in the presence of Darius Dandy, MD.  Electronically Signed: Julien [name], ED Scribe. 09/19/15. 2:14 AM.    History   Chief Complaint Chief Complaint  Patient presents with  . Cellulitis    The history is provided by the patient. No language interpreter was used.   HPI Comments: Darius [name] is a 60 y.o. male with PMHx of A-fib on Eliquis, CHF and DM, who presents to the Emergency Department complaining of sudden onset, gradual worsening, moderate right elbow swelling onset 2-3 days ago. He reports associated redness, tenderness and pain to the surrounding area. He also notes hardening of the skin. Pt reports increased pain with movement. Patient hit his arm while on a friends deck and noticed a skin tear on his right elbow with no laceration or bleeding noted 10 days ago. Patient was seen at [facility] yesterday where he had an aspiration of the area and was placed on an unknown antibiotic. He has taken 2 days worth of antibiotics and taken ibuprofen with minimal relief. He denies fever, chills, night sweats, nausea or vomiting.   He also indicates that he has been non-compliant with his diabetes medication and his blood thinner due to lack of insurance. He reports associated loss of appetite and weight loss.    Past Medical History:  Diagnosis Date  . Atrial fibrillation ([place])    unable to tolerate versed and fentanyl sedation and required gen anesthesia for TEE;  s/p TEE-DCCV (failed);  Amiodarone started  . Chronic diastolic heart failure ([place])    a. Echo 03/27/11: EF 50-55%, inferior hypokinesis, moderate LAE, mild RVE, normal pulmonary pressures. ;   b.  TEE (04/03/11): EF 40-45%,  mild MR moderate LAE, no LAA clot, mild RAE;  c.  Echocardiogram (02/2013): EF 60-65%, Gr 2 DD, mildly dilated Ao root (Ao root dimension 39 mm), MAC, mild LAE, normal RVF  . Diabetes mellitus, type 2 ([place])   . Hx of cardiovascular stress test    Lexiscan Myoview (02/2013): No ischemia or scar, EF 51%, low risk  . Morbid obesity ([place])   . Sleep apnea    a. sleep test 01/2013:  very severy OSA    Patient Active Problem List   Diagnosis Date Noted  . Cellulitis 09/19/2015  . Dilated cardiomyopathy ([place]) 04/19/2014  . Type 2 diabetes mellitus ([place]) 04/09/2014  . Non compliance with medical treatment and diet 04/09/2014  . Chronic anticoagulation-Eliquis started 04/06/14 04/09/2014  . Abnormal chest CT-LUL nodule- needs f/u May 2016 04/09/2014  . Chronic combined systolic and diastolic heart failure ([place]) 04/05/2014  . OSA - C-pap 03/22/2013  . Persistent atrial fibrillation ([place]) 03/27/2011  . Obesity, Class III, BMI 40-49.9 (morbid obesity) ([place]) 03/27/2011  . URI, acute 03/27/2011    Past Surgical History:  Procedure Laterality Date  . CARDIOVERSION  04/01/2011   Procedure: CARDIOVERSION;  Surgeon: Lelon Perla, MD;  Location: [facility] ENDOSCOPY;  Service: Cardiovascular;  Laterality: N/A;  . CARDIOVERSION  04/03/2011   Procedure: CARDIOVERSION;  Surgeon: Lelon Perla, MD;  Location: [facility] ENDOSCOPY;  Service: Cardiovascular;  Laterality: N/A;  . CARDIOVERSION  04/03/2011   Procedure: CARDIOVERSION;  Surgeon: Lelon Perla, MD;  Location: [facility] OR;  Service: Cardiovascular;  Laterality: N/A;  . CARDIOVERSION N/A 06/15/2014   Procedure: CARDIOVERSION;  Surgeon: [name], MD;  Location: [facility];  Service: Cardiovascular;  Laterality: N/A;  . TEE WITHOUT CARDIOVERSION  04/01/2011   Procedure: TRANSESOPHAGEAL ECHOCARDIOGRAM (TEE);  Surgeon: Lelon Perla, MD;  Location: [facility] ENDOSCOPY;  Service: Cardiovascular;  Laterality: N/A;  . TEE WITHOUT CARDIOVERSION  04/03/2011   Procedure:  TRANSESOPHAGEAL ECHOCARDIOGRAM (TEE);  Surgeon: Lelon Perla, MD;  Location: [facility] ENDOSCOPY;  Service: Cardiovascular;  Laterality: N/A;  . TEE WITHOUT CARDIOVERSION  04/03/2011   Procedure: TRANSESOPHAGEAL ECHOCARDIOGRAM (TEE);  Surgeon: Lelon Perla, MD;  Location: [facility] OR;  Service: Cardiovascular;  Laterality: N/A;       Home Medications    Prior to Admission medications   Medication Sig Start Date End Date Taking? Authorizing Provider  amiodarone (PACERONE) 200 MG tablet Take 1 tablet (200 mg total) by mouth daily. 04/24/14   Erlene Quan, PA-C  apixaban (ELIQUIS) 5 MG TABS tablet Take 1 tablet (5 mg total) by mouth 2 (two) times daily. 04/09/14   Erlene Quan, PA-C  Blood Glucose Monitoring Suppl (BAYER CONTOUR MONITOR) W/DEVICE KIT 1 each by Does not apply route once. Use twice a day to check blood sugar 09/02/13   [name], DO  CARTIA XT 120 MG 24 hr capsule Take 1 capsule (120 mg total) by mouth daily. 04/28/14   [name], MD  furosemide (LASIX) 40 MG tablet Take 1 tablet (40 mg total) by mouth 2 (two) times daily. 04/09/14   Erlene Quan, PA-C  glucose blood (BAYER CONTOUR TEST) test strip Use as instructed to check blood sugar twice a day 09/02/13   [name], DO  metFORMIN (GLUCOPHAGE) 1000 MG tablet take 1 tablet by mouth twice a day with food **PATIENT NEEDS APPOINTMENT BEFORE FURTHER REFILLS** 07/27/14   [name], DO  metoprolol succinate (TOPROL-XL) 25 MG 24 hr tablet Take 1 tablet (25 mg total) by mouth 2 (two) times daily. 04/28/14   [name], MD  potassium chloride SA (K-DUR,KLOR-CON) 20 MEQ tablet Take 1 tablet (20 mEq total) by mouth daily. 04/09/14   Erlene Quan, PA-C    Family History Family History  Problem Relation Age of Onset  . Diabetes Father     died in his 34s  . Hypertension Father     died in her 74s  . Seizures Mother   . Hypertension Sister   . Hypertension Paternal Grandfather   . Heart attack Neg Hx   . Stroke Neg Hx      Social History Social History  Substance Use Topics  . Smoking status: Never Smoker  . Smokeless tobacco: Never Used  . Alcohol use Yes     Comment: some     Allergies   Review of patient's allergies indicates no known allergies.   Review of Systems Review of Systems 10/14 Systems reviewed and are negative for acute change except as noted in the HPI.   Physical Exam Updated Vital Signs BP 109/70   Pulse 91   Temp 98.3 F (36.8 C)   Resp 18   Ht _0  (1.88 m)   Wt 218 lb 2 oz (98.9 kg)   SpO2 98%   BMI 28.01 kg/m   Physical Exam Physical Exam  Nursing note and vitals reviewed. Constitutional: Well developed, well nourished, non-toxic, and in no acute distress Head: Normocephalic and atraumatic.  Mouth/Throat: Oropharynx is clear and moist.  Neck: Normal range of motion. Neck supple.  Cardiovascular: Normal rate and regular rhythm. +2 radial pulse bilaterally.    Pulmonary/Chest: Effort normal and breath sounds normal.  Abdominal: Soft. There is no tenderness. There is no rebound and no guarding.  Musculoskeletal: Normal range of motion of left elbow.  Neurological: Alert, no facial droop, fluent speech, moves all extremities symmetrically Skin: Erythema, induration, warmth and tenderness involving the right elbow extending to proximal forearm. Enlarged bursa without active drainage.  Psychiatric: Cooperative   ED Treatments / Results  DIAGNOSTIC STUDIES: Oxygen Saturation is 100% on RA, normal by my interpretation.  COORDINATION OF CARE: 1:44 AM Discussed treatment plan which includes blood work, x-ray and round of antibiotics with pt at bedside and pt agreed to plan. Will admit to hospital and pt agreed to plan.  Labs (all labs ordered are listed, but only abnormal results are displayed) Labs Reviewed  CBC WITH DIFFERENTIAL/PLATELET - Abnormal; Notable for the following:       Result Value   WBC 11.8 (*)    MCHC 36.4 (*)    Monocytes Absolute 1.7  (*)    All other components within normal limits  BASIC METABOLIC PANEL - Abnormal; Notable for the following:    Sodium 127 (*)    Chloride 91 (*)    CO2 20 (*)    Glucose, Bld 274 (*)    Calcium 8.7 (*)    Anion gap 16 (*)    All other components within normal limits  I-STAT CG4 LACTIC ACID, ED - Abnormal; Notable for the following:    Lactic Acid, Venous 2.74 (*)    All other components within normal limits  URINALYSIS, ROUTINE W REFLEX MICROSCOPIC (NOT AT [facility])    EKG  EKG Interpretation None       Radiology Dg Elbow Complete Right  Result Date: 09/19/2015 CLINICAL DATA:  Acute onset of right elbow pain and swelling, after hitting elbow on deck. Initial encounter. EXAM: RIGHT ELBOW - COMPLETE 3+ VIEW COMPARISON:  None. FINDINGS: There is no evidence of fracture or dislocation. The visualized joint spaces are preserved. No significant joint effusion is identified. Diffuse soft tissue swelling is noted about the elbow. IMPRESSION: No evidence of fracture or dislocation. Electronically Signed   By: Garald Balding M.D.   On: 09/19/2015 02:58    Procedures Procedures (including critical care time)  Medications Ordered in ED Medications  vancomycin (VANCOCIN) IVPB 1000 mg/200 mL premix (1,000 mg Intravenous New Bag/Given 09/19/15 0336)  vancomycin (VANCOCIN) 1-5 GM/200ML-% IVPB (not administered)  sodium chloride 0.9 % bolus 1,000 mL (not administered)  sodium chloride 0.9 % bolus 1,000 mL (0 mLs Intravenous Stopped 09/19/15 0337)     Initial Impression / Assessment and Plan / ED Course  I have reviewed the triage vital signs and the nursing notes.  Pertinent labs & imaging results that were available during my care of the patient were reviewed by me and considered in my medical decision making (see chart for details).  Clinical Course    60 year old male with cellulitis and septic bursitis of the right arm. Afebrile and HD stable. Mild leukocytosis, hyperglycemia and  elevated lactic acid. Also poor healing due to DM and technically failed outpatient antibiotics. Received vancomycin and IVF. Normal ROM of the elbow. Not suspecting septic arthritis. Discussed with Dr. Alcario Drought who will admit to medicine service for ongoing care.  Final Clinical Impressions(s) / ED Diagnoses   Final  diagnoses:  Cellulitis of right upper extremity  Septic bursitis of elbow, right   I personally performed the services described in this documentation, which was scribed in my presence. The recorded information has been reviewed and is accurate.   New Prescriptions New Prescriptions   No medications on file     Darius Dandy, MD 09/19/15 [phone]

## 2015-09-19 NOTE — ED Notes (Signed)
Report given to rn on 3e 

## 2015-09-19 NOTE — Progress Notes (Signed)
Pharmacy Antibiotic Note  Darius [name] is a 60 y.o. male admitted on 09/19/2015 with septic bursitis of L elbow.  Pharmacy has been consulted for Vancomycin dosing.  Vanc 1gm IV given in ED ~0330  Plan: Vancomycin 1250mg  IV q12h Will f/u micro data, renal function, and pt's clinical condition Vanc trough prn   Height: 6\' 2"  (188 cm) Weight: 218 lb 2 oz (98.9 kg) IBW/kg (Calculated) : 82.2  Temp (24hrs), Avg:98.2 F (36.8 C), Min:98 F (36.7 C), Max:98.3 F (36.8 C)   Recent Labs Lab 09/19/15 0215 09/19/15 0345  WBC 11.8*  --   CREATININE 1.02  --   LATICACIDVEN  --  2.74*    Estimated Creatinine Clearance: 95.6 mL/min (by C-G formula based on SCr of 1.02 mg/dL).    No Known Allergies  Antimicrobials this admission: 8/9 Vanc >>   Dose adjustments this admission: n/a  Thank you for allowing pharmacy to be a part of this patient's care.  Sherlon Handing, PharmD, BCPS Clinical pharmacist, pager [phone] 09/19/2015 4:16 AM

## 2015-09-19 NOTE — Progress Notes (Signed)
Patient admitted after midnight, please see H&P.  Called [facility] who would not release information regarding culture with out a faxed release from patient.  Nurse updated.  Ortho consult.   Septic bursitis of R elbow -                       Continue vanc for now                       - [facility], [phone]): requested culture results from the elbow-- was given augmentin there                       -BCx in ED                       -ortho consult            -area marked  Persistent A.Fib -                       Chronic and stable                       Not taking blood thinners currently due to inability to afford, will hold off on starting new blood thinners other than   DVT ppx though in case the bursitis ends up becoming surgical this admit.  DM2 -                       Hold metformin                       Moderate scale SSI ac/hs   -HgbA1C  Lactic acidosis   -IVF and recheck  Hyponatremia   -IVF and recheck BMP   Leukocytosis   -trend  Darius Bear DO

## 2015-09-19 NOTE — Consult Note (Signed)
Reason for Consult:  Right elbow bursa infection Referring Physician: Princess Bruins, [facility]  Darius [name] is an 60 y.o. male.  HPI:   60 yo male who presented to the ED in the past 24 hours with worsening right elbow pain and redness.  Ortho consulted to assess for infected olecranon bursitis.  He reports hitting his right elbow against something about a week ago and it has worsened since then.  He first went to an urgent care and then came to the ED.  He was admitted early this am and started on IV antibiotics.  Past Medical History:  Diagnosis Date  . Atrial fibrillation ([place])    unable to tolerate versed and fentanyl sedation and required gen anesthesia for TEE;  s/p TEE-DCCV (failed);  Amiodarone started  . Chronic diastolic heart failure ([place])    a. Echo 03/27/11: EF 50-55%, inferior hypokinesis, moderate LAE, mild RVE, normal pulmonary pressures. ;   b.  TEE (04/03/11): EF 40-45%, mild MR moderate LAE, no LAA clot, mild RAE;  c.  Echocardiogram (02/2013): EF 60-65%, Gr 2 DD, mildly dilated Ao root (Ao root dimension 39 mm), MAC, mild LAE, normal RVF  . Diabetes mellitus, type 2 ([place])   . Hx of cardiovascular stress test    Lexiscan Myoview (02/2013): No ischemia or scar, EF 51%, low risk  . Morbid obesity ([place])   . Sleep apnea    a. sleep test 01/2013:  very severy OSA    Past Surgical History:  Procedure Laterality Date  . CARDIOVERSION  04/01/2011   Procedure: CARDIOVERSION;  Surgeon: Lelon Perla, MD;  Location: [facility] ENDOSCOPY;  Service: Cardiovascular;  Laterality: N/A;  . CARDIOVERSION  04/03/2011   Procedure: CARDIOVERSION;  Surgeon: Lelon Perla, MD;  Location: [facility] ENDOSCOPY;  Service: Cardiovascular;  Laterality: N/A;  . CARDIOVERSION  04/03/2011   Procedure: CARDIOVERSION;  Surgeon: Lelon Perla, MD;  Location: [place];  Service: Cardiovascular;  Laterality: N/A;  . CARDIOVERSION N/A 06/15/2014   Procedure: CARDIOVERSION;  Surgeon: [name], MD;  Location: [place];  Service: Cardiovascular;  Laterality: N/A;  . TEE WITHOUT CARDIOVERSION  04/01/2011   Procedure: TRANSESOPHAGEAL ECHOCARDIOGRAM (TEE);  Surgeon: Lelon Perla, MD;  Location: [facility] ENDOSCOPY;  Service: Cardiovascular;  Laterality: N/A;  . TEE WITHOUT CARDIOVERSION  04/03/2011   Procedure: TRANSESOPHAGEAL ECHOCARDIOGRAM (TEE);  Surgeon: Lelon Perla, MD;  Location: [facility] ENDOSCOPY;  Service: Cardiovascular;  Laterality: N/A;  . TEE WITHOUT CARDIOVERSION  04/03/2011   Procedure: TRANSESOPHAGEAL ECHOCARDIOGRAM (TEE);  Surgeon: Lelon Perla, MD;  Location: [facility] OR;  Service: Cardiovascular;  Laterality: N/A;    Family History  Problem Relation Age of Onset  . Diabetes Father     died in his 71s  . Hypertension Father     died in her 36s  . Seizures Mother   . Hypertension Sister   . Hypertension Paternal Grandfather   . Heart attack Neg Hx   . Stroke Neg Hx     Social History:  reports that he has never smoked. He has never used smokeless tobacco. He reports that he drinks alcohol. He reports that he does not use drugs.  Allergies: No Known Allergies  Medications: I have reviewed the patient's current medications.  Results for orders placed or performed during the hospital encounter of 09/19/15 (from the past 48 hour(s))  CBC with Differential     Status: Abnormal   Collection Time: 09/19/15  2:15 AM  Result Value Ref Range  WBC 11.8 (H) 4.0 - 10.5 K/uL   RBC 5.15 4.22 - 5.81 MIL/uL   Hemoglobin 15.6 13.0 - 17.0 g/dL   HCT 42.9 39.0 - 52.0 %   MCV 83.3 78.0 - 100.0 fL   MCH 30.3 26.0 - 34.0 pg   MCHC 36.4 (H) 30.0 - 36.0 g/dL   RDW 12.3 11.5 - 15.5 %   Platelets 248 150 - 400 K/uL   Neutrophils Relative % 62 %   Lymphocytes Relative 23 %   Monocytes Relative 14 %   Eosinophils Relative 1 %   Basophils Relative 0 %   Neutro Abs 7.3 1.7 - 7.7 K/uL   Lymphs Abs 2.7 0.7 - 4.0 K/uL   Monocytes Absolute 1.7 (H) 0.1 - 1.0 K/uL   Eosinophils Absolute 0.1 0.0 - 0.7  K/uL   Basophils Absolute 0.0 0.0 - 0.1 K/uL   WBC Morphology ATYPICAL LYMPHOCYTES   Basic metabolic panel     Status: Abnormal   Collection Time: 09/19/15  2:15 AM  Result Value Ref Range   Sodium 127 (L) 135 - 145 mmol/L   Potassium 3.5 3.5 - 5.1 mmol/L   Chloride 91 (L) 101 - 111 mmol/L   CO2 20 (L) 22 - 32 mmol/L   Glucose, Bld 274 (H) 65 - 99 mg/dL   BUN 14 6 - 20 mg/dL   Creatinine, Ser 1.02 0.61 - 1.24 mg/dL   Calcium 8.7 (L) 8.9 - 10.3 mg/dL   GFR calc non Af Amer >60 >60 mL/min   GFR calc Af Amer >60 >60 mL/min    Comment: (NOTE) The eGFR has been calculated using the CKD EPI equation. This calculation has not been validated in all clinical situations. eGFR's persistently <60 mL/min signify possible Chronic Kidney Disease.    Anion gap 16 (H) 5 - 15  I-Stat CG4 Lactic Acid, ED     Status: Abnormal   Collection Time: 09/19/15  3:45 AM  Result Value Ref Range   Lactic Acid, Venous 2.74 (HH) 0.5 - 1.9 mmol/L   Comment NOTIFIED PHYSICIAN   Urinalysis, Routine w reflex microscopic (not at [facility])     Status: Abnormal   Collection Time: 09/19/15  4:20 AM  Result Value Ref Range   Color, Urine YELLOW YELLOW   APPearance CLEAR CLEAR   Specific Gravity, Urine 1.030 1.005 - 1.030   pH 5.5 5.0 - 8.0   Glucose, UA >1000 (A) NEGATIVE mg/dL   Hgb urine dipstick NEGATIVE NEGATIVE   Bilirubin Urine NEGATIVE NEGATIVE   Ketones, ur >80 (A) NEGATIVE mg/dL   Protein, ur NEGATIVE NEGATIVE mg/dL   Nitrite NEGATIVE NEGATIVE   Leukocytes, UA NEGATIVE NEGATIVE  Urine microscopic-add on     Status: Abnormal   Collection Time: 09/19/15  4:20 AM  Result Value Ref Range   Squamous Epithelial / LPF 0-5 (A) NONE SEEN   WBC, UA 0-5 0 - 5 WBC/hpf   RBC / HPF 0-5 0 - 5 RBC/hpf   Bacteria, UA FEW (A) NONE SEEN   Casts HYALINE CASTS (A) NEGATIVE   Urine-Other YEAST PRESENT   Glucose, capillary     Status: Abnormal   Collection Time: 09/19/15  7:05 AM  Result Value Ref Range    Glucose-Capillary 242 (H) 65 - 99 mg/dL  Basic metabolic panel     Status: Abnormal   Collection Time: 09/19/15  8:24 AM  Result Value Ref Range   Sodium 130 (L) 135 - 145 mmol/L   Potassium 3.0 (L)  3.5 - 5.1 mmol/L   Chloride 96 (L) 101 - 111 mmol/L   CO2 26 22 - 32 mmol/L   Glucose, Bld 191 (H) 65 - 99 mg/dL   BUN 9 6 - 20 mg/dL   Creatinine, Ser 0.80 0.61 - 1.24 mg/dL   Calcium 8.4 (L) 8.9 - 10.3 mg/dL   GFR calc non Af Amer >60 >60 mL/min   GFR calc Af Amer >60 >60 mL/min    Comment: (NOTE) The eGFR has been calculated using the CKD EPI equation. This calculation has not been validated in all clinical situations. eGFR's persistently <60 mL/min signify possible Chronic Kidney Disease.    Anion gap 8 5 - 15  Lactic acid, plasma     Status: Abnormal   Collection Time: 09/19/15  8:31 AM  Result Value Ref Range   Lactic Acid, Venous 2.0 (HH) 0.5 - 1.9 mmol/L    Comment: CRITICAL RESULT CALLED TO, READ BACK BY AND VERIFIED WITHWilber [name] [id number] WILDERK   Glucose, capillary     Status: Abnormal   Collection Time: 09/19/15 12:02 PM  Result Value Ref Range   Glucose-Capillary 365 (H) 65 - 99 mg/dL    Dg Elbow Complete Right  Result Date: 09/19/2015 CLINICAL DATA:  Acute onset of right elbow pain and swelling, after hitting elbow on deck. Initial encounter. EXAM: RIGHT ELBOW - COMPLETE 3+ VIEW COMPARISON:  None. FINDINGS: There is no evidence of fracture or dislocation. The visualized joint spaces are preserved. No significant joint effusion is identified. Diffuse soft tissue swelling is noted about the elbow. IMPRESSION: No evidence of fracture or dislocation. Electronically Signed   By: Garald Balding M.D.   On: 09/19/2015 02:58    ROS Blood pressure 113/64, pulse 90, temperature 97.9 F (36.6 C), temperature source Oral, resp. rate 18, height _0  (1.88 m), weight 98.4 kg (217 lb), SpO2 98 %. Physical Exam  Constitutional: He is oriented to person, place, and time. He  appears well-developed and well-nourished.  HENT:  Head: Normocephalic and atraumatic.  Cardiovascular: Normal rate.   Respiratory: Effort normal.  GI: Soft.  Musculoskeletal:       Right elbow: He exhibits swelling and effusion. Tenderness found. Olecranon process tenderness noted.       Arms: Neurological: He is alert and oriented to person, place, and time.    Assessment/Plan: Infected right elbow olecranon bursa 1)  At the bedside I aspirated 20 cc or grossly purulent fluid for his olecranon area of the right elbow.  Given the redness, pain and amount of purulence, an open irrigation and debridement is warranted.  Will make him NPO now and proceed to surgery this evening.  Risks and benefits discussed in detail.  Mcarthur Rossetti 09/19/2015, 12:27 PM

## 2015-09-19 NOTE — H&P (Signed)
History and Physical    Darius [name] [id number] DOB: 06/30/1955 DOA: 09/19/2015   PCP: [name]., DO Chief Complaint:  Chief Complaint  Patient presents with  . Cellulitis    HPI: Darius [name] is a 60 y.o. male with medical history significant of DM2, A.Fib, chronic diastolic CHF, supposed to be on eliquis but not due to inability to afford.  Also not the most complaint with DM diet and meds.  Patient presents to the ED with c/o 3 day history of worsening R elbow, swelling, pain, redness.  He went to an UC on Monday, they aspirated area, got a lot of purulent material out, and put him on an unknown PO antibiotic that may be augmentin based on med rec.  Despite this his swelling and pain got worse, he returned to UC today and they sent him in to the ED.  ED Course: In the ED patient clearly has bursitis of elbow with surrounding cellulitis.  He is started on vanc, and hospitalist is asked to admit.  Review of Systems: As per HPI otherwise 10 point review of systems negative.    Past Medical History:  Diagnosis Date  . Atrial fibrillation ([place])    unable to tolerate versed and fentanyl sedation and required gen anesthesia for TEE;  s/p TEE-DCCV (failed);  Amiodarone started  . Chronic diastolic heart failure ([place])    a. Echo 03/27/11: EF 50-55%, inferior hypokinesis, moderate LAE, mild RVE, normal pulmonary pressures. ;   b.  TEE (04/03/11): EF 40-45%, mild MR moderate LAE, no LAA clot, mild RAE;  c.  Echocardiogram (02/2013): EF 60-65%, Gr 2 DD, mildly dilated Ao root (Ao root dimension 39 mm), MAC, mild LAE, normal RVF  . Diabetes mellitus, type 2 ([place])   . Hx of cardiovascular stress test    Lexiscan Myoview (02/2013): No ischemia or scar, EF 51%, low risk  . Morbid obesity ([place])   . Sleep apnea    a. sleep test 01/2013:  very severy OSA    Past Surgical History:  Procedure Laterality Date  . CARDIOVERSION  04/01/2011   Procedure: CARDIOVERSION;  Surgeon: Lelon Perla,  MD;  Location: [facility] ENDOSCOPY;  Service: Cardiovascular;  Laterality: N/A;  . CARDIOVERSION  04/03/2011   Procedure: CARDIOVERSION;  Surgeon: Lelon Perla, MD;  Location: [facility] ENDOSCOPY;  Service: Cardiovascular;  Laterality: N/A;  . CARDIOVERSION  04/03/2011   Procedure: CARDIOVERSION;  Surgeon: Lelon Perla, MD;  Location: [place];  Service: Cardiovascular;  Laterality: N/A;  . CARDIOVERSION N/A 06/15/2014   Procedure: CARDIOVERSION;  Surgeon: [name], MD;  Location: [place];  Service: Cardiovascular;  Laterality: N/A;  . TEE WITHOUT CARDIOVERSION  04/01/2011   Procedure: TRANSESOPHAGEAL ECHOCARDIOGRAM (TEE);  Surgeon: Lelon Perla, MD;  Location: [facility] ENDOSCOPY;  Service: Cardiovascular;  Laterality: N/A;  . TEE WITHOUT CARDIOVERSION  04/03/2011   Procedure: TRANSESOPHAGEAL ECHOCARDIOGRAM (TEE);  Surgeon: Lelon Perla, MD;  Location[facility] END[facility]COPY;  Service: Cardiovascular;  Laterality: N/A;  . TEE WITHOUT CARDIOVERSION  04/03/2011   Procedure: TRANSESOPHAGEAL ECHOCARDIOGRAM (TEE);  Surgeon: Lelon Perla, MD;  Location: [place];  Service: Cardiovascular;  Laterality: N/A;     reports that he has never smoked. He has never used smokeless tobacco. He reports that he drinks alcohol. He reports that he does not use drugs.  No Known Allergies  Family History  Problem Relation Age of Onset  . Diabetes Father     died in his 71s  . Hypertension  Father     died in her 53s  . Seizures Mother   . Hypertension Sister   . Hypertension Paternal Grandfather   . Heart attack Neg Hx   . Stroke Neg Hx      Prior to Admission medications   Medication Sig Start Date End Date Taking? Authorizing Provider  amoxicillin-clavulanate (AUGMENTIN) 875-125 MG tablet Take 1 tablet by mouth 2 (two) times daily.   Yes Historical Provider, MD  aspirin EC 81 MG tablet Take 81 mg by mouth daily.   Yes Historical Provider, MD  metFORMIN (GLUCOPHAGE) 1000 MG tablet take 1 tablet by mouth twice a  day with food **PATIENT NEEDS APPOINTMENT BEFORE FURTHER REFILLS** 07/27/14  Yes [name], DO    Physical Exam: Vitals:   09/19/15 0345 09/19/15 0348 09/19/15 0415 09/19/15 0445  BP: 119/82 109/70 129/80 113/72  Pulse: 101 91 90 90  Resp:  18    Temp:  98.3 F (36.8 C)    TempSrc:      SpO2: 97% 98% 99% 99%  Weight:      Height:          Constitutional: NAD, calm, comfortable Eyes: PERRL, lids and conjunctivae normal ENMT: Mucous membranes are moist. Posterior pharynx clear of any exudate or lesions.Normal dentition.  Neck: normal, supple, no masses, no thyromegaly Respiratory: clear to auscultation bilaterally, no wheezing, no crackles. Normal respiratory effort. No accessory muscle use.  Cardiovascular: Regular rate and rhythm, no murmurs / rubs / gallops. No extremity edema. 2+ pedal pulses. No carotid bruits.  Abdomen: no tenderness, no masses palpated. No hepatosplenomegaly. Bowel sounds positive.  Musculoskeletal: no clubbing / cyanosis. No joint deformity upper and lower extremities. Good ROM, no contractures. Normal muscle tone.  Skin: patient has erythema, edema, tenderness, of R elbow Neurologic: CN 2-12 grossly intact. Sensation intact, DTR normal. Strength 5/5 in all 4.  Psychiatric: Normal judgment and insight. Alert and oriented x 3. Normal mood.    Labs on Admission: I have personally reviewed following labs and imaging studies  CBC:  Recent Labs Lab 09/19/15 0215  WBC 11.8*  NEUTROABS 7.3  HGB 15.6  HCT 42.9  MCV 83.3  PLT [id number]   Basic Metabolic Panel:  Recent Labs Lab 09/19/15 0215  NA 127*  K 3.5  CL 91*  CO2 20*  GLUCOSE 274*  BUN 14  CREATININE 1.02  CALCIUM 8.7*   GFR: Estimated Creatinine Clearance: 95.6 mL/min (by C-G formula based on SCr of 1.02 mg/dL). Liver Function Tests: No results for input(s): AST, ALT, ALKPHOS, BILITOT, PROT, ALBUMIN in the last 168 hours. No results for input(s): LIPASE, AMYLASE in the last 168  hours. No results for input(s): AMMONIA in the last 168 hours. Coagulation Profile: No results for input(s): INR, PROTIME in the last 168 hours. Cardiac Enzymes: No results for input(s): CKTOTAL, CKMB, CKMBINDEX, TROPONINI in the last 168 hours. BNP (last 3 results) No results for input(s): PROBNP in the last 8760 hours. HbA1C: No results for input(s): HGBA1C in the last 72 hours. CBG: No results for input(s): GLUCAP in the last 168 hours. Lipid Profile: No results for input(s): CHOL, HDL, LDLCALC, TRIG, CHOLHDL, LDLDIRECT in the last 72 hours. Thyroid Function Tests: No results for input(s): TSH, T4TOTAL, FREET4, T3FREE, THYROIDAB in the last 72 hours. Anemia Panel: No results for input(s): VITAMINB12, FOLATE, FERRITIN, TIBC, IRON, RETICCTPCT in the last 72 hours. Urine analysis:    Component Value Date/Time   COLORURINE YELLOW 09/19/2015 0420  APPEARANCEUR CLEAR 09/19/2015 0420   LABSPEC 1.030 09/19/2015 0420   PHURINE 5.5 09/19/2015 0420   GLUCOSEU >1000 (A) 09/19/2015 0420   HGBUR NEGATIVE 09/19/2015 0420   BILIRUBINUR NEGATIVE 09/19/2015 0420   KETONESUR >80 (A) 09/19/2015 0420   PROTEINUR NEGATIVE 09/19/2015 0420   UROBILINOGEN 1.0 03/26/2011 2017   NITRITE NEGATIVE 09/19/2015 0420   LEUKOCYTESUR NEGATIVE 09/19/2015 0420   Sepsis Labs: @LABRCNTIP (procalcitonin:4,lacticidven:4) )No results found for this or any previous visit (from the past 240 hour(s)).   Radiological Exams on Admission: Dg Elbow Complete Right  Result Date: 09/19/2015 CLINICAL DATA:  Acute onset of right elbow pain and swelling, after hitting elbow on deck. Initial encounter. EXAM: RIGHT ELBOW - COMPLETE 3+ VIEW COMPARISON:  None. FINDINGS: There is no evidence of fracture or dislocation. The visualized joint spaces are preserved. No significant joint effusion is identified. Diffuse soft tissue swelling is noted about the elbow. IMPRESSION: No evidence of fracture or dislocation. Electronically Signed    By: Garald Balding M.D.   On: 09/19/2015 02:58    EKG: Independently reviewed.  Assessment/Plan Principal Problem:   Septic olecranon bursitis of right elbow Active Problems:   Persistent atrial fibrillation (HCC)   Chronic combined systolic and diastolic heart failure (HCC)   Type 2 diabetes mellitus (HCC)   Non compliance with medical treatment and diet  Septic bursitis of R elbow -  Continue vanc for now  After discussion with patient, and some work with google, I suspect that the name of the UC he was at is [facility], their number is [phone], they open at 8 am today.  Suspect they probably have culture results from the elbow.  Have drawn BCx in ED  May wish to talk to ortho in AM but usually they often try to avoid surgery in these cases, so wont keep patient NPO for now.  Persistent A.Fib -  Chronic and stable  Not taking blood thinners currently due to inability to afford, will hold off on starting new blood thinners other than DVT ppx though in case the bursitis ends up becoming surgical this admit.  DM2 -  Hold metformin  Moderate scale SSI ac/hs   DVT prophylaxis: Lovenox Code Status: Full Family Communication: No family in room Consults called: None Admission status: Admit to inpatient   Etta Quill DO [facility] Pager [phone] from 7PM-7AM  If 7AM-7PM, please contact the day physician for the patient [email or web address] Password [email or web address]  09/19/2015, 5:06 AM

## 2015-09-19 NOTE — Care Management Note (Signed)
Case Management Note  Patient Details  Name: Darius [name] MRN: [id number] Date of Birth: 1955-09-17  Subjective/Objective:    Septic                Action/Plan: Patient stated that he lost his job this year and no longer has medical insurance, having difficulty paying for medication. He is extremely talkative and rambles a lot. PCP was [facility] care but cannot afford co payment, he is agreeable to go to the [facility] at discharge; Patient also stated that his uncle died and left him and his sister a large amount of money and he has been living off of that since he lost his job. ( per pt he has been withdrawing $3,000 a month) not sure if pt will qualify for any medication assistance. Mary with the [facility] to see pt also. He is on Eliquis, and he is not sure if he wants to complete any paperwork for financial assistance. Also he has plans to apply for [employer] in November.  Expected Discharge Date: possibly 09/22/2015                 Expected Discharge Plan:  Home/Self Care  In-House Referral:    [facility]  Discharge planning Services  CM Consult, Medication Assistance    Status of Service:  In process, will continue to follow  Sherrilyn Rist [id number] 09/19/2015, 10:02 AM

## 2015-09-19 NOTE — Progress Notes (Addendum)
Initial Nutrition Assessment   INTERVENTION:  Provide Multivitamin with minerals daily Restrict intake of juice and sugar-sweetened beverages Allow double portions of protein and vegetables on meal tray Strongly advised adherence to diet Discontinue Ensure Enlive supplements Provide Glucerna Shakes once daily, provides 220 kcal and 10 grams of protein  Recommend outpatient nutrition education/counseling   NUTRITION DIAGNOSIS:   Other (Comment) (Limited adherence to nutrition-related recommendations) related to other (see comment) (disinterest in applying information/knowledge) as evidenced by other (see comment) (lack of compliance and lack of appreciation of the importance of making recommended nutrition-related changes).   GOAL:   Other (Comment) (blood glucose WNL and wound healing)   MONITOR:   PO intake, Labs, Weight trends, Skin, I & O's  REASON FOR ASSESSMENT:   Malnutrition Screening Tool, Consult Poor PO  ASSESSMENT:   60 y.o. male with medical history significant of DM2, A.Fib, chronic diastolic CHF, supposed to be on eliquis but not due to inability to afford.  Also not the most complaint with DM diet and meds.  Patient presents to the ED with c/o 3 day history of worsening R elbow, swelling, pain, redness.   Pt states that his appetite has been good and he is eating 90-100% of meals here. He reports a history of weight loss > 1 year ago due to 2-3 month period of nausea and early satiety which initially started after eating seafood and drinking excessive amounts of sweet tea. He states that his stomach shrank during that time and now he has been maintaining his weight between 217 and 228 lbs. He denies any education needs. He reports understanding of a low carb diet and a low sodium diet, but states that he has been unable to give up sweets, Pepsi, or sweet tea. He reports eating less for 2 days PTA due to doctors advising him to eat only vegetables and meats. Pt  states he is very tempted to drink a Pepsi, but ordered grape juice instead. RD encouraged pt to drink water or diet drinks only until elbow heals, as juice and sugary beverages can cause high blood glucose and high blood glucose can delay healing. Pt states that he understands, but he doesn't like the taste of diet beverages and he heard they were linked to strokes anyway.  Strongly encouraged compliance to diet recommendations. Encouraged increased intake of protein and vegetables and moderate intake of whole grains and fruit; strongly discouraged intake of juice of sugary beverages, especially until elbow heals.   Labs: glucose ranging 191-365 mg/dL, low sodium, low chloride, low potassium, low calcium, hemoglobin A1C pending  Diet Order:  Diet Carb Modified Fluid consistency: Thin; Room service appropriate? Yes  Skin:  Wound (see comment) (cellulitis in right elbow)  Last BM:  8/8  Height:   Ht Readings from Last 1 Encounters:  09/19/15 6\' 2"  (1.88 m)    Weight:   Wt Readings from Last 1 Encounters:  09/19/15 217 lb (98.4 kg)    Ideal Body Weight:  86.36 kg  BMI:  Body mass index is 27.86 kg/m.  Estimated Nutritional Needs:   Kcal:  [id number]  Protein:  110-120 grams  Fluid:  2.7 L/day  EDUCATION NEEDS:   No education needs identified at this time  [place], LDN Inpatient Clinical Dietitian Pager: [phone] After Hours Pager: [phone]

## 2015-09-19 NOTE — Transfer of Care (Signed)
Immediate Anesthesia Transfer of Care Note  Patient: Darius [name]  Procedure(s) Performed: Procedure(s): IRRIGATION AND DEBRIDEMENT EXTREMITY (Right)  Patient Location: PACU  Anesthesia Type:General  Level of Consciousness: awake  Airway & Oxygen Therapy: Patient Spontanous Breathing  Post-op Assessment: Report given to RN and Post -op Vital signs reviewed and stable  Post vital signs: Reviewed and stable  Last Vitals:  Vitals:   09/19/15 1638 09/19/15 1840  BP: 108/66   Pulse: 82   Resp: 18   Temp: 37 C (P) 36.7 C    Last Pain:  Vitals:   09/19/15 1638  TempSrc: Oral  PainSc:          Complications: No apparent anesthesia complications

## 2015-09-19 NOTE — Anesthesia Preprocedure Evaluation (Signed)
Anesthesia Evaluation  Patient identified by MRN, date of birth, ID band Patient awake    Reviewed: Allergy & Precautions, NPO status , Patient's Chart, lab work & pertinent test results  Airway Mallampati: II  TM Distance: <3 FB Neck ROM: Full    Dental no notable dental hx.    Pulmonary sleep apnea ,    Pulmonary exam normal breath sounds clear to auscultation       Cardiovascular Normal cardiovascular exam+ dysrhythmias Atrial Fibrillation + Valvular Problems/Murmurs MR  Rhythm:Regular Rate:Normal  EF 50%   Neuro/Psych negative neurological ROS  negative psych ROS   GI/Hepatic negative GI ROS, Neg liver ROS,   Endo/Other  diabetes  Renal/GU negative Renal ROS  negative genitourinary   Musculoskeletal negative musculoskeletal ROS (+)   Abdominal   Peds negative pediatric ROS (+)  Hematology negative hematology ROS (+)   Anesthesia Other Findings   Reproductive/Obstetrics negative OB ROS                             Anesthesia Physical Anesthesia Plan  ASA: III  Anesthesia Plan: General   Post-op Pain Management:    Induction: Intravenous  Airway Management Planned: LMA and Oral ETT  Additional Equipment:   Intra-op Plan:   Post-operative Plan: Extubation in OR  Informed Consent: I have reviewed the patients History and Physical, chart, labs and discussed the procedure including the risks, benefits and alternatives for the proposed anesthesia with the patient or authorized representative who has indicated his/her understanding and acceptance.   Dental advisory given  Plan Discussed with: CRNA and Surgeon  Anesthesia Plan Comments:         Anesthesia Quick Evaluation

## 2015-09-19 NOTE — Hospital Discharge Follow-Up (Signed)
Met with the patient to discuss plans for hospital follow up after speaking with Olga Coaster, RN CM .  The patient stated that he doesn't have a PCP and is interested in following up at the [facility]. Explained to him that the [facility] has a pharmacy and can possilby assist him with obtaining his medications through the pharmacy assistance programs. Also explained that there are Financial Counselors at [facility] who can help him apply for the [employer]. He was appreciative of the information as he noted that he doesn't; have any insurance. He said that he was laid off from work and the only income that he has is $1000/month from a trust that was left to him from his uncle.  Informed him that there are no appointments available right now at [facility] but he can call at any time to check for cancellations. The contact # is on the AVS.  Will continue to follow his hospital course and schedule an appointment as one becomes available.

## 2015-09-19 NOTE — Anesthesia Procedure Notes (Signed)
Procedure Name: LMA Insertion Date/Time: 09/19/2015 6:00 PM Performed by: Manus Gunning, Marguis Mathieson J Pre-anesthesia Checklist: Emergency Drugs available, Patient identified, Suction available, Patient being monitored and Timeout performed Patient Re-evaluated:Patient Re-evaluated prior to inductionOxygen Delivery Method: Circle system utilized Preoxygenation: Pre-oxygenation with 100% oxygen Intubation Type: IV induction Ventilation: Mask ventilation without difficulty LMA: LMA inserted LMA Size: 5.0 Number of attempts: 1 Placement Confirmation: positive ETCO2 and breath sounds checked- equal and bilateral Tube secured with: Tape Dental Injury: Teeth and Oropharynx as per pre-operative assessment

## 2015-09-19 NOTE — ED Notes (Signed)
Pt transported to xray 

## 2015-09-20 ENCOUNTER — Encounter (HOSPITAL_COMMUNITY): Payer: Self-pay | Admitting: Orthopaedic Surgery

## 2015-09-20 DIAGNOSIS — Z9119 Patient's noncompliance with other medical treatment and regimen: Secondary | ICD-10-CM

## 2015-09-20 LAB — GLUCOSE, CAPILLARY
GLUCOSE-CAPILLARY: 189 mg/dL — AB (ref 65–99)
GLUCOSE-CAPILLARY: 273 mg/dL — AB (ref 65–99)
Glucose-Capillary: 312 mg/dL — ABNORMAL HIGH (ref 65–99)
Glucose-Capillary: 288 mg/dL — ABNORMAL HIGH (ref 65–99)

## 2015-09-20 LAB — BASIC METABOLIC PANEL
ANION GAP: 7 (ref 5–15)
BUN: 9 mg/dL (ref 6–20)
CO2: 27 mmol/L (ref 22–32)
Calcium: 8.4 mg/dL — ABNORMAL LOW (ref 8.9–10.3)
Chloride: 98 mmol/L — ABNORMAL LOW (ref 101–111)
Creatinine, Ser: 0.7 mg/dL (ref 0.61–1.24)
Glucose, Bld: 323 mg/dL — ABNORMAL HIGH (ref 65–99)
POTASSIUM: 4.2 mmol/L (ref 3.5–5.1)
SODIUM: 132 mmol/L — AB (ref 135–145)

## 2015-09-20 LAB — CBC
HEMATOCRIT: 38.4 % — AB (ref 39.0–52.0)
HEMOGLOBIN: 13.1 g/dL (ref 13.0–17.0)
MCH: 28.8 pg (ref 26.0–34.0)
MCHC: 34.1 g/dL (ref 30.0–36.0)
MCV: 84.4 fL (ref 78.0–100.0)
PLATELETS: 254 10*3/uL (ref 150–400)
RBC: 4.55 MIL/uL (ref 4.22–5.81)
RDW: 12.5 % (ref 11.5–15.5)
WBC: 10.5 10*3/uL (ref 4.0–10.5)

## 2015-09-20 NOTE — Progress Notes (Signed)
Subjective: 1 Day Post-Op Procedure(s) (LRB): IRRIGATION AND DEBRIDEMENT EXTREMITY (Right) Patient reports pain as mild.  No complaints except swelling of right hand.  Objective: Vital signs in last 24 hours: Temp:  [97.5 F (36.4 C)-98.6 F (37 C)] 98 F (36.7 C) (08/10 0455) Pulse Rate:  [73-95] 73 (08/10 0455) Resp:  [16-18] 16 (08/10 0455) BP: (102-123)/(58-80) 102/73 (08/10 0455) SpO2:  [94 %-100 %] 99 % (08/10 0455) Weight:  [98.5 kg (217 lb 1.6 oz)] 98.5 kg (217 lb 1.6 oz) (08/10 0345)  Intake/Output from previous day: 08/09 0701 - 08/10 0700 In: 2317 [P.O.:1097; I.V.:970; IV Piggyback:250] Out: 660 [Urine:650; Blood:10] Intake/Output this shift: Total I/O In: 175 [P.O.:175] Out: -    Recent Labs  09/19/15 0215 09/20/15 0320  HGB 15.6 13.1    Recent Labs  09/19/15 0215 09/20/15 0320  WBC 11.8* 10.5  RBC 5.15 4.55  HCT 42.9 38.4*  PLT 248 254    Recent Labs  09/19/15 0824 09/20/15 0320  NA 130* 132*  K 3.0* 4.2  CL 96* 98*  CO2 26 27  BUN 9 9  CREATININE 0.80 0.70  GLUCOSE 191* 323*  CALCIUM 8.4* 8.4*   No results for input(s): LABPT, INR in the last 72 hours.   Right upper extremity: Sensation intact distally Incision: dressing C/D/I Compartment soft Radial pulse intact.  Assessment/Plan: 1 Day Post-Op Procedure(s) (LRB): IRRIGATION AND DEBRIDEMENT EXTREMITY (Right) Encouraged elevation of right hand and motion of fingers.  Will change dressing tomorrow.  WBC trending down Continue IV antibiotics  GILBERT CLARK 09/20/2015, 11:27 AM

## 2015-09-20 NOTE — Progress Notes (Signed)
Inpatient Diabetes Program Recommendations  AACE/ADA: New Consensus Statement on Inpatient Glycemic Control (2015)  Target Ranges:  Prepandial:   less than 140 mg/dL      Peak postprandial:   less than 180 mg/dL (1-2 hours)      Critically ill patients:  140 - 180 mg/dL   Results for Darius [name], Darius [name] (MRN [id number]) as of 09/20/2015 10:12  Ref. Range 09/19/2015 07:05 09/19/2015 12:02 09/19/2015 16:43 09/19/2015 18:39 09/19/2015 20:58 09/20/2015 06:45  Glucose-Capillary Latest Ref Range: 65 - 99 mg/dL 242 (H) 365 (H) 116 (H) 133 (H) 244 (H) 312 (H)   Review of Glycemic Control  Diabetes history: DM2 Outpatient Diabetes medications: Metformin 1000 mg BID Current orders for Inpatient glycemic control: Novolog 0-15 units TID with meals  Inpatient Diabetes Program Recommendations:  Insulin - Basal: Fasting glucose 312 mg/dl this morning and 242 mg/dl yesterday morning. Please consider ordering low dose basal insulin. Recommend starting with Lantus 10 units Q24H starting now (based on 98 kg x 0.1 untis). Correction (SSI): Please consider ordering Novolog bedtime correction scale. HgbA1C: A1C in process.  Thanks, Barnie Alderman, RN, MSN, CDE Diabetes Coordinator Inpatient Diabetes Program [phone] (Team Pager from [place] to [place]) [phone] (AP office) [phone] [facility] office) [phone] [facility] office)

## 2015-09-20 NOTE — Progress Notes (Signed)
PROGRESS NOTE    Darius [name]  [id number] DOB: July 19, 1955 DOA: 09/19/2015 PCP: [name]., DO   Outpatient Specialists:     Brief Narrative:  Darius [name] is a 60 y.o. male with medical history significant of DM2, A.Fib, chronic diastolic CHF, supposed to be on eliquis but not due to inability to afford.  Also not the most complaint with DM diet and meds.  Patient presents to the ED with c/o 3 day history of worsening R elbow, swelling, pain, redness.  He went to an UC on Monday, they aspirated area, got a lot of purulent material out, and put him on an unknown PO antibiotic that may be augmentin based on med rec.  Despite this his swelling and pain got worse, he returned to UC today and they sent him in to the ED.   Assessment & Plan:   Principal Problem:   Septic olecranon bursitis of right elbow Active Problems:   Persistent atrial fibrillation (HCC)   Chronic combined systolic and diastolic heart failure (HCC)   Type 2 diabetes mellitus (HCC)   Non compliance with medical treatment and diet   Lactic acid acidosis   Septic bursitis of R elbow - Continue vanc for now - [facility], [id number]): culture results received here-- was given augmentin there -BCx -ortho consult- s/p I and D and wash out                          Persistent A.Fib - Chronic and stable outpatient follow up for anticoagulation  DM2 - Hold metformin Moderate scale SSI ac/hs                      -HgbA1C  Lactic acidosis                  -Improved with IVF  Hyponatremia                 -IVF and recheck BMP   Leukocytosis                  -trend    DVT prophylaxis:  Lovenox   Code Status: Full Code   Family Communication: patient  Disposition Plan:     Consultants:     Procedures:    Wash out of elbow     Subjective: Hand swelling  Objective: Vitals:   09/20/15 0001 09/20/15 0345 09/20/15 0455 09/20/15 1155  BP: 114/71  102/73 99/60  Pulse: 95  73 71  Resp: 16  16 18   Temp: 97.6 F (36.4 C)  98 F (36.7 C) 97.8 F (36.6 C)  TempSrc: Oral  Oral Oral  SpO2: 94%  99% 99%  Weight:  98.5 kg (217 lb 1.6 oz)    Height:        Intake/Output Summary (Last 24 hours) at 09/20/15 1214 Last data filed at 09/20/15 1027  Gross per 24 hour  Intake             1895 ml  Output              660 ml  Net             1235 ml   Filed Weights   09/18/15 2059 09/19/15 0532 09/20/15 0345  Weight: 98.9 kg (218 lb 2 oz) 98.4 kg (217 lb) 98.5 kg (217 lb 1.6 oz)    Examination:  General exam: Appears calm and comfortable- rambles when speaking  Respiratory system: Clear to auscultation. Respiratory effort normal. Cardiovascular system: irr, No pedal edema. Gastrointestinal system: Abdomen is nondistended, soft and nontender. No organomegaly or masses felt. Normal bowel sounds heard. Central nervous system: Alert and oriented. No focal neurological deficits. Extremities: right arm in sling/wrap Psychiatry: rambles when speaking    Data Reviewed: I have personally reviewed following labs and imaging studies  CBC:  Recent Labs Lab 09/19/15 0215 09/20/15 0320  WBC 11.8* 10.5  NEUTROABS 7.3  --   HGB 15.6 13.1  HCT 42.9 38.4*  MCV 83.3 84.4  PLT 248 0000000   Basic Metabolic Panel:  Recent Labs Lab 09/19/15 0215 09/19/15 0824 09/20/15 0320  NA 127* 130* 132*  K 3.5 3.0* 4.2  CL 91* 96* 98*  CO2 20* 26 27  GLUCOSE 274* 191* 323*  BUN 14 9 9   CREATININE 1.02 0.80 0.70  CALCIUM 8.7* 8.4* 8.4*   GFR: Estimated Creatinine Clearance: 112.7 mL/min (by C-G formula based on SCr of 0.8 mg/dL). Liver Function Tests: No results for input(s): AST, ALT, ALKPHOS, BILITOT, PROT, ALBUMIN in the last 168 hours. No results for input(s): LIPASE, AMYLASE in the last 168  hours. No results for input(s): AMMONIA in the last 168 hours. Coagulation Profile: No results for input(s): INR, PROTIME in the last 168 hours. Cardiac Enzymes: No results for input(s): CKTOTAL, CKMB, CKMBINDEX, TROPONINI in the last 168 hours. BNP (last 3 results) No results for input(s): PROBNP in the last 8760 hours. HbA1C: No results for input(s): HGBA1C in the last 72 hours. CBG:  Recent Labs Lab 09/19/15 1643 09/19/15 1839 09/19/15 2058 09/20/15 0645 09/20/15 1126  GLUCAP 116* 133* 244* 312* 189*   Lipid Profile: No results for input(s): CHOL, HDL, LDLCALC, TRIG, CHOLHDL, LDLDIRECT in the last 72 hours. Thyroid Function Tests: No results for input(s): TSH, T4TOTAL, FREET4, T3FREE, THYROIDAB in the last 72 hours. Anemia Panel: No results for input(s): VITAMINB12, FOLATE, FERRITIN, TIBC, IRON, RETICCTPCT in the last 72 hours. Urine analysis:    Component Value Date/Time   COLORURINE YELLOW 09/19/2015 0420   APPEARANCEUR CLEAR 09/19/2015 0420   LABSPEC 1.030 09/19/2015 0420   PHURINE 5.5 09/19/2015 0420   GLUCOSEU >1000 (A) 09/19/2015 0420   HGBUR NEGATIVE 09/19/2015 0420   BILIRUBINUR NEGATIVE 09/19/2015 0420   KETONESUR >80 (A) 09/19/2015 0420   PROTEINUR NEGATIVE 09/19/2015 0420   UROBILINOGEN 1.0 03/26/2011 2017   NITRITE NEGATIVE 09/19/2015 0420   LEUKOCYTESUR NEGATIVE 09/19/2015 0420     ) Recent Results (from the past 240 hour(s))  Surgical PCR screen     Status: None   Collection Time: 09/19/15  3:55 PM  Result Value Ref Range Status   MRSA, PCR NEGATIVE NEGATIVE Final   Staphylococcus aureus NEGATIVE NEGATIVE Final    Comment:        The Xpert SA Assay (FDA approved for NASAL specimens in patients over 79 years of age), is one component of a comprehensive surveillance program.  Test performance has been validated by [facility] for patients greater than or equal to 65 year old. It is not intended to diagnose infection nor to guide or monitor  treatment.   Aerobic/Anaerobic Culture (surgical/deep wound)     Status: None (Preliminary result)   Collection Time: 09/19/15  6:18 PM  Result Value Ref Range Status   Specimen Description ABSCESS  Final   Special Requests   Final    RIGHT ELBOW BURSA PATIENT ON FOLLOWING VANC AND ANCEF   Gram Stain  Final    ABUNDANT WBC PRESENT, PREDOMINANTLY PMN RARE GRAM POSITIVE COCCI IN PAIRS    Culture PENDING  Incomplete   Report Status PENDING  Incomplete      Anti-infectives    Start     Dose/Rate Route Frequency Ordered Stop   09/19/15 1745  ceFAZolin (ANCEF) IVPB 2g/100 mL premix     2 g 200 mL/hr over 30 Minutes Intravenous To Surgery 09/19/15 1523 09/19/15 1752   09/19/15 1600  vancomycin (VANCOCIN) 1,250 mg in sodium chloride 0.9 % 250 mL IVPB     1,250 mg 166.7 mL/hr over 90 Minutes Intravenous Every 12 hours 09/19/15 0420     09/19/15 1415  ceFAZolin (ANCEF) IVPB 2g/100 mL premix  Status:  Discontinued     2 g 200 mL/hr over 30 Minutes Intravenous To Surgery 09/19/15 1405 09/19/15 1523   09/19/15 0327  vancomycin (VANCOCIN) 1-5 GM/200ML-% IVPB    Comments:  Chrisco, Christine  : cabinet override      09/19/15 0327 09/19/15 1544   09/19/15 0315  vancomycin (VANCOCIN) IVPB 1000 mg/200 mL premix     1,000 mg 200 mL/hr over 60 Minutes Intravenous  Once 09/19/15 0302 09/19/15 0430       Radiology Studies: Dg Elbow Complete Right  Result Date: 09/19/2015 CLINICAL DATA:  Acute onset of right elbow pain and swelling, after hitting elbow on deck. Initial encounter. EXAM: RIGHT ELBOW - COMPLETE 3+ VIEW COMPARISON:  None. FINDINGS: There is no evidence of fracture or dislocation. The visualized joint spaces are preserved. No significant joint effusion is identified. Diffuse soft tissue swelling is noted about the elbow. IMPRESSION: No evidence of fracture or dislocation. Electronically Signed   By: Garald Balding M.D.   On: 09/19/2015 02:58        Scheduled Meds: . aspirin EC   81 mg Oral Daily  . enoxaparin (LOVENOX) injection  40 mg Subcutaneous Q24H  . feeding supplement (GLUCERNA SHAKE)  237 mL Oral Q24H  . insulin aspart  0-15 Units Subcutaneous TID WC  . multivitamin with minerals  1 tablet Oral Daily  . vancomycin  1,250 mg Intravenous Q12H   Continuous Infusions:    LOS: 1 day    Time spent: 25 min    [place], DO [facility] Pager [phone]  If 7PM-7AM, please contact night-coverage [email or web address] Password [email or web address] 09/20/2015, 12:14 PM

## 2015-09-21 LAB — GLUCOSE, CAPILLARY
Glucose-Capillary: 258 mg/dL — ABNORMAL HIGH (ref 65–99)
Glucose-Capillary: 214 mg/dL — ABNORMAL HIGH (ref 65–99)

## 2015-09-21 LAB — HEMOGLOBIN A1C

## 2015-09-21 MED ORDER — DOXYCYCLINE HYCLATE 100 MG PO TABS
100.0000 mg | ORAL_TABLET | Freq: Two times a day (BID) | ORAL | Status: DC
  Administered 2015-09-21: 100 mg via ORAL
  Filled 2015-09-21: qty 1

## 2015-09-21 MED ORDER — DOXYCYCLINE HYCLATE 100 MG PO TABS: 100.0000 mg | ORAL_TABLET | Freq: Two times a day (BID) | ORAL | 0 refills | Status: DC

## 2015-09-21 MED ORDER — METFORMIN HCL 1000 MG PO TABS: ORAL_TABLET | ORAL | 0 refills | Status: DC

## 2015-09-21 NOTE — Progress Notes (Signed)
Patient is for discharge home today; CM informed pt that he can use the pharmacy at the [facility] if he choose to get his prescriptions filled. Mindi Slicker [facility] [phone]

## 2015-09-21 NOTE — Discharge Summary (Signed)
Physician Discharge Summary  Darius [name] [id number] DOB: August 02, 1955 DOA: 09/19/2015  PCP: [name]., DO  Admit date: 09/19/2015 Discharge date: 09/21/2015   Recommendations for Outpatient Follow-Up:   1. Finish abx course 2. Follow HgbA1C   Discharge Diagnosis:   Principal Problem:   Septic olecranon bursitis of right elbow Active Problems:   Persistent atrial fibrillation (HCC)   Chronic combined systolic and diastolic heart failure (HCC)   Type 2 diabetes mellitus (HCC)   Non compliance with medical treatment and diet   Lactic acid acidosis   Discharge disposition:  Home  Discharge Condition: Improved.  Diet recommendation: Low sodium, heart healthy.  Carbohydrate-modified.  Wound care: change gauze wrap daily   History of Present Illness:   Darius [name] is a 60 y.o. male with medical history significant of DM2, A.Fib, chronic diastolic CHF, supposed to be on eliquis but not due to inability to afford.  Also not the most complaint with DM diet and meds.  Patient presents to the ED with c/o 3 day history of worsening R elbow, swelling, pain, redness.  He went to an UC on Monday, they aspirated area, got a lot of purulent material out, and put him on an unknown PO antibiotic that may be augmentin based on med rec.  Despite this his swelling and pain got worse, he returned to UC today and they sent him in to the ED.    Hospital Course by Problem:   R septic elbow with bursitis- 2 weeks of doxycycline 100 mg twice daily. S/p wash out  Persistent A.Fib - Chronic and stable outpatient follow up for anticoagulation  DM2 - resume home meds -HgbA1C-- suspect will need another agent added to regimen  Lactic acidosis -Improved with IVF  Hyponatremia -improved with IVF   Leukocytosis -improved   Medical  Consultants:    ortho   Discharge Exam:   Vitals:   09/20/15 2100 09/21/15 0426  BP: 110/70 126/82  Pulse: 86 78  Resp: 18 17  Temp: 98 F (36.7 C) 97.9 F (36.6 C)   Vitals:   09/20/15 0455 09/20/15 1155 09/20/15 2100 09/21/15 0426  BP: 102/73 99/60 110/70 126/82  Pulse: 73 71 86 78  Resp: 16 18 18 17   Temp: 98 F (36.7 C) 97.8 F (36.6 C) 98 F (36.7 C) 97.9 F (36.6 C)  TempSrc: Oral Oral Oral Oral  SpO2: 99% 99% 97% 99%  Weight:    100.2 kg (221 lb)  Height:        Gen:  NAD    The results of significant diagnostics from this hospitalization (including imaging, microbiology, ancillary and laboratory) are listed below for reference.     Procedures and Diagnostic Studies:   Dg Elbow Complete Right  Result Date: 09/19/2015 CLINICAL DATA:  Acute onset of right elbow pain and swelling, after hitting elbow on deck. Initial encounter. EXAM: RIGHT ELBOW - COMPLETE 3+ VIEW COMPARISON:  None. FINDINGS: There is no evidence of fracture or dislocation. The visualized joint spaces are preserved. No significant joint effusion is identified. Diffuse soft tissue swelling is noted about the elbow. IMPRESSION: No evidence of fracture or dislocation. Electronically Signed   By: Garald Balding M.D.   On: 09/19/2015 02:58     Labs:   Basic Metabolic Panel:  Recent Labs Lab 09/19/15 0215 09/19/15 0824 09/20/15 0320  NA 127* 130* 132*  K 3.5 3.0* 4.2  CL 91* 96* 98*  CO2 20* 26 27  GLUCOSE 274* 191* 323*  BUN 14 9 9   CREATININE 1.02 0.80 0.70  CALCIUM 8.7* 8.4* 8.4*   GFR Estimated Creatinine Clearance: 122.6 mL/min (by C-G formula based on SCr of 0.8 mg/dL). Liver Function Tests: No results for input(s): AST, ALT, ALKPHOS, BILITOT, PROT, ALBUMIN in the last 168 hours. No results for input(s): LIPASE, AMYLASE in the last 168 hours. No results for input(s): AMMONIA in the last 168 hours. Coagulation profile No results for input(s): INR, PROTIME in the last 168  hours.  CBC:  Recent Labs Lab 09/19/15 0215 09/20/15 0320  WBC 11.8* 10.5  NEUTROABS 7.3  --   HGB 15.6 13.1  HCT 42.9 38.4*  MCV 83.3 84.4  PLT 248 254   Cardiac Enzymes: No results for input(s): CKTOTAL, CKMB, CKMBINDEX, TROPONINI in the last 168 hours. BNP: Invalid input(s): POCBNP CBG:  Recent Labs Lab 09/20/15 1126 09/20/15 1625 09/20/15 2102 09/21/15 0731 09/21/15 1150  GLUCAP 189* 288* 273* 258* 214*   D-Dimer No results for input(s): DDIMER in the last 72 hours. Hgb A1c  Recent Labs  09/20/15 0320  HGBA1C >15.5*   Lipid Profile No results for input(s): CHOL, HDL, LDLCALC, TRIG, CHOLHDL, LDLDIRECT in the last 72 hours. Thyroid function studies No results for input(s): TSH, T4TOTAL, T3FREE, THYROIDAB in the last 72 hours.  Invalid input(s): FREET3 Anemia work up No results for input(s): VITAMINB12, FOLATE, FERRITIN, TIBC, IRON, RETICCTPCT in the last 72 hours. Microbiology Recent Results (from the past 240 hour(s))  Surgical PCR screen     Status: None   Collection Time: 09/19/15  3:55 PM  Result Value Ref Range Status   MRSA, PCR NEGATIVE NEGATIVE Final   Staphylococcus aureus NEGATIVE NEGATIVE Final    Comment:        The Xpert SA Assay (FDA approved for NASAL specimens in patients over 73 years of age), is one component of a comprehensive surveillance program.  Test performance has been validated by [facility] for patients greater than or equal to 43 year old. It is not intended to diagnose infection nor to guide or monitor treatment.   Aerobic/Anaerobic Culture (surgical/deep wound)     Status: None (Preliminary result)   Collection Time: 09/19/15  6:18 PM  Result Value Ref Range Status   Specimen Description ABSCESS  Final   Special Requests   Final    RIGHT ELBOW BURSA PATIENT ON FOLLOWING VANC AND ANCEF   Gram Stain   Final    ABUNDANT WBC PRESENT, PREDOMINANTLY PMN RARE GRAM POSITIVE COCCI IN PAIRS    Culture   Final     MODERATE STAPHYLOCOCCUS AUREUS NO ANAEROBES ISOLATED; CULTURE IN PROGRESS FOR 5 DAYS SUSCEPTIBILITIES TO FOLLOW    Report Status PENDING  Incomplete     Discharge Instructions:   Discharge Instructions    Diet - low sodium heart healthy    Complete by:  As directed   Diet Carb Modified    Complete by:  As directed   Discharge instructions    Complete by:  As directed   Change dressing daily   Increase activity slowly    Complete by:  As directed       Medication List    STOP taking these medications   amoxicillin-clavulanate 875-125 MG tablet Commonly known as:  AUGMENTIN     TAKE these medications   aspirin EC 81 MG tablet Take 81 mg by mouth daily.   doxycycline 100 MG tablet Commonly known as:  VIBRA-TABS Take 1 tablet (100 mg total) by  mouth every 12 (twelve) hours.   metFORMIN 1000 MG tablet Commonly known as:  GLUCOPHAGE take 1 tablet by mouth twice a day with food **PATIENT NEEDS APPOINTMENT BEFORE FURTHER REFILLS** What changed:  See the new instructions.      Follow-up [facility] Follow up on 09/24/2015.   Specialty:  Internal Medicine Why:  Patient to call Monday for Follow up appointment.  Contact information: [id number] [place]       Mcarthur Rossetti, MD Follow up on 10/03/2015.   Specialty:  Orthopedic Surgery Why:  @ 2:30  Contact information: [place] [address] [phone]            Time coordinating discharge: 35 min  Signed:  Kaysen Deal U Kaiesha Tonner   [facility] 09/22/2015, 3:00 PM

## 2015-09-21 NOTE — Discharge Instructions (Signed)
You can get your right elbow incision wet in the shower daily starting Monday 8/14.

## 2015-09-21 NOTE — Progress Notes (Signed)
Inpatient Diabetes Program Recommendations  AACE/ADA: New Consensus Statement on Inpatient Glycemic Control (2015)  Target Ranges:  Prepandial:   less than 140 mg/dL      Peak postprandial:   less than 180 mg/dL (1-2 hours)      Critically ill patients:  140 - 180 mg/dL   Results for Darius [name], Darius [name] (MRN [id number]) as of 09/21/2015 08:33  Ref. Range 09/20/2015 06:45 09/20/2015 11:26 09/20/2015 16:25 09/20/2015 21:02 09/21/2015 07:31  Glucose-Capillary Latest Ref Range: 65 - 99 mg/dL 312 (H) 189 (H) 288 (H) 273 (H) 258 (H)   Review of Glycemic Control  Diabetes history: DM2 Outpatient Diabetes medications: Metformin 1000 mg BID Current orders for Inpatient glycemic control: Novolog 0-15 units TID with meals  Inpatient Diabetes Program Recommendations: Insulin - Basal: Over the past 24 hours, glucose has ranged from 189-312 mg/dl and patient has received a total of Novolog 30 units for correction. Please consider ordering Lantus 10 units Q24H starting now (based on 100 kg x 0.2 units). Correction (SSI): Bedtime glucose 272 mg/dl last night but no insulin given due to no bedtime correction ordered. Please consider ordering Novolog 0-5 units QHS.  Thanks, Barnie Alderman, RN, MSN, CDE Diabetes Coordinator Inpatient Diabetes Program [phone] (Team Pager from [place] to [place]) [phone] ([facility] office) [phone] [facility] office) [phone] [facility] office)

## 2015-09-21 NOTE — Progress Notes (Signed)
Patient ID: Darius [name], male   DOB: [birth year], 60 y.o.   MRN: [id number] Looks much better overall.  Dressing changed and right elbow swelling and redness have subsided a good deal.  New dressing applied.  Can be discharged from Ortho standpoint on 2 weeks of doxycycline 100 mg twice daily.

## 2015-09-24 LAB — AEROBIC/ANAEROBIC CULTURE W GRAM STAIN (SURGICAL/DEEP WOUND)

## 2015-09-24 LAB — AEROBIC/ANAEROBIC CULTURE (SURGICAL/DEEP WOUND)

## 2015-09-27 ENCOUNTER — Telehealth: Payer: Self-pay

## 2015-09-27 NOTE — Telephone Encounter (Signed)
This Case Manager received communication from [name], RN CM that patient needing hospital follow-up appointment. This Case Manager placed call to patient. Patient's name and date of birth verified. Patient confirms he does not have a PCP and indicated he needed a hospital follow-up appointment.  Appointment scheduled for 10/04/15 at 1430 with [name], [name] at [facility]. Patient informed of clinic's address and phone number. Patient appreciative of information. Reminded patient of clinic's onsite pharmacy. Patient indicated he picked up his discharge medications at [facility]. Encouraged patient to bring all of his medications to his upcoming appointment. Patient verbalized understanding. No additional needs identified.

## 2015-10-04 ENCOUNTER — Ambulatory Visit: Payer: Self-pay | Attending: Internal Medicine | Admitting: Physician Assistant

## 2015-10-04 VITALS — BP 108/71 | HR 97 | Temp 97.9°F | Resp 16 | Wt 225.2 lb

## 2015-10-04 DIAGNOSIS — E871 Hypo-osmolality and hyponatremia: Secondary | ICD-10-CM | POA: Insufficient documentation

## 2015-10-04 DIAGNOSIS — M71121 Other infective bursitis, right elbow: Secondary | ICD-10-CM

## 2015-10-04 DIAGNOSIS — Z79899 Other long term (current) drug therapy: Secondary | ICD-10-CM | POA: Insufficient documentation

## 2015-10-04 DIAGNOSIS — Z91199 Patient's noncompliance with other medical treatment and regimen due to unspecified reason: Secondary | ICD-10-CM

## 2015-10-04 DIAGNOSIS — Z9119 Patient's noncompliance with other medical treatment and regimen: Secondary | ICD-10-CM | POA: Insufficient documentation

## 2015-10-04 DIAGNOSIS — E1100 Type 2 diabetes mellitus with hyperosmolarity without nonketotic hyperglycemic-hyperosmolar coma (NKHHC): Secondary | ICD-10-CM | POA: Insufficient documentation

## 2015-10-04 DIAGNOSIS — Z7984 Long term (current) use of oral hypoglycemic drugs: Secondary | ICD-10-CM | POA: Insufficient documentation

## 2015-10-04 DIAGNOSIS — B9689 Other specified bacterial agents as the cause of diseases classified elsewhere: Secondary | ICD-10-CM

## 2015-10-04 DIAGNOSIS — M7021 Olecranon bursitis, right elbow: Secondary | ICD-10-CM

## 2015-10-04 DIAGNOSIS — Z9111 Patient's noncompliance with dietary regimen: Secondary | ICD-10-CM | POA: Insufficient documentation

## 2015-10-04 LAB — BASIC METABOLIC PANEL
BUN: 12 mg/dL (ref 7–25)
CHLORIDE: 103 mmol/L (ref 98–110)
CO2: 26 mmol/L (ref 20–31)
Calcium: 9.5 mg/dL (ref 8.6–10.3)
Creat: 0.72 mg/dL (ref 0.70–1.25)
GLUCOSE: 92 mg/dL (ref 65–99)
POTASSIUM: 4.2 mmol/L (ref 3.5–5.3)
Sodium: 140 mmol/L (ref 135–146)

## 2015-10-04 LAB — GLUCOSE, POCT (MANUAL RESULT ENTRY): POC GLUCOSE: 119 mg/dL — AB (ref 70–99)

## 2015-10-04 MED ORDER — METFORMIN HCL 1000 MG PO TABS: ORAL_TABLET | ORAL | 3 refills | Status: DC

## 2015-10-04 MED ORDER — ASPIRIN EC 325 MG PO TBEC: 325.0000 mg | DELAYED_RELEASE_TABLET | Freq: Every day | ORAL | 3 refills | Status: DC

## 2015-10-04 NOTE — Patient Instructions (Signed)
Check Blood sugar fasting and at bedtime and record.  Bring to next visit.    Basic Carbohydrate Counting for Diabetes Mellitus Carbohydrate counting is a method for keeping track of the amount of carbohydrates you eat. Eating carbohydrates naturally increases the level of sugar (glucose) in your blood, so it is important for you to know the amount that is okay for you to have in every meal. Carbohydrate counting helps keep the level of glucose in your blood within normal limits. The amount of carbohydrates allowed is different for every person. A dietitian can help you calculate the amount that is right for you. Once you know the amount of carbohydrates you can have, you can count the carbohydrates in the foods you want to eat. Carbohydrates are found in the following foods:  Grains, such as breads and cereals.  Dried beans and soy products.  Starchy vegetables, such as potatoes, peas, and corn.  Fruit and fruit juices.  Milk and yogurt.  Sweets and snack foods, such as cake, cookies, candy, chips, soft drinks, and fruit drinks. CARBOHYDRATE COUNTING There are two ways to count the carbohydrates in your food. You can use either of the methods or a combination of both. Reading the "Nutrition Facts" on [place] The "Nutrition Facts" is an area that is included on the labels of almost all packaged food and beverages in the [place]. It includes the serving size of that food or beverage and information about the nutrients in each serving of the food, including the grams (g) of carbohydrate per serving.  Decide the number of servings of this food or beverage that you will be able to eat or drink. Multiply that number of servings by the number of grams of carbohydrate that is listed on the label for that serving. The total will be the amount of carbohydrates you will be having when you eat or drink this food or beverage. Learning Standard Serving Sizes of Food When you eat food that is  not packaged or does not include "Nutrition Facts" on the label, you need to measure the servings in order to count the amount of carbohydrates.A serving of most carbohydrate-rich foods contains about 15 g of carbohydrates. The following list includes serving sizes of carbohydrate-rich foods that provide 15 g ofcarbohydrate per serving:   1 slice of bread (1 oz) or 1 six-inch tortilla.    of a hamburger bun or English muffin.  4-6 crackers.   cup unsweetened dry cereal.    cup hot cereal.   cup rice or pasta.    cup mashed potatoes or  of a large baked potato.  1 cup fresh fruit or one small piece of fruit.    cup canned or frozen fruit or fruit juice.  1 cup milk.   cup plain fat-free yogurt or yogurt sweetened with artificial sweeteners.   cup cooked dried beans or starchy vegetable, such as peas, corn, or potatoes.  Decide the number of standard-size servings that you will eat. Multiply that number of servings by 15 (the grams of carbohydrates in that serving). For example, if you eat 2 cups of strawberries, you will have eaten 2 servings and 30 g of carbohydrates (2 servings x 15 g = 30 g). For foods such as soups and casseroles, in which more than one food is mixed in, you will need to count the carbohydrates in each food that is included. EXAMPLE OF CARBOHYDRATE COUNTING Sample Dinner  3 oz chicken breast.   cup of  brown rice.   cup of corn.  1 cup milk.   1 cup strawberries with sugar-free whipped topping.  Carbohydrate Calculation Step 1: Identify the foods that contain carbohydrates:   Rice.   Corn.   Milk.   Strawberries. Step 2:Calculate the number of servings eaten of each:   2 servings of rice.   1 serving of corn.   1 serving of milk.   1 serving of strawberries. Step 3: Multiply each of those number of servings by 15 g:   2 servings of rice x 15 g = 30 g.   1 serving of corn x 15 g = 15 g.   1 serving of milk  x 15 g = 15 g.   1 serving of strawberries x 15 g = 15 g. Step 4: Add together all of the amounts to find the total grams of carbohydrates eaten: 30 g + 15 g + 15 g + 15 g = 75 g.   This information is not intended to replace advice given to you by your health care provider. Make sure you discuss any questions you have with your health care provider.   Document Released: 01/27/2005 Document Revised: 02/17/2014 Document Reviewed: 12/24/2012 Elsevier Interactive Patient Education [employer].

## 2015-10-04 NOTE — Progress Notes (Signed)
Pt is in the office today for hospital f/u Pt states he is not in any pain today Pt states he got the stiches taken out yesterday

## 2015-10-04 NOTE — Progress Notes (Signed)
Patient ID: Darius [name], male   DOB: 1955/12/03, 60 y.o.   MRN: [id number]   Darius [name], is a 60 y.o. male  [id number]  [id number]  DOB - 06-12-1955  Subjective:  Chief Complaint and HPI: Darius [name] is a 60 y.o. male here today to establish care and for a follow up visit after recent hospitalization 08/09-08/12/2015 for olecranon bursitis.  An I&D was performed and he was sent home on antibiotics(Doxycycline).  He has a f/up appt with the orthopedist next week.  No f/c.  He is feeling much better.  He was also restarted on metformin for uncontrolled diabetes.  He had not been taking his medication for about 1 year.  He had noticed weight loss but no other s/sx.  He admits to a lot of sweets in his diet.  H/O afib with cardioversion in 2016. No palpitations or CP.  He has a glucometer at home but has not been checking his blood sugar.  He is willing to start checking it.  He is about to go out of town to [place] be back in town until September 25. No SE since restarting metformin-tolerating it well.  Previously controlled with metformin alone.   ED/Hospital notes reviewed.    ROS:   Constitutional:  No f/c, No night sweats EENT:  No vision changes, No blurry vision, No hearing changes. No mouth, throat, or ear problems.  Respiratory: No cough, No SOB Cardiac: No CP, no palpitations GI:  No abd pain, No N/V/D. GU: No Urinary s/sx Musculoskeletal: No joint pain Neuro: No headache, no dizziness, no motor weakness.  Skin: No rash Endocrine:  No polydipsia. No polyuria.  Psych: Denies SI/HI  No problems updated.  ALLERGIES: No Known Allergies  PAST MEDICAL HISTORY: Past Medical History:  Diagnosis Date  . Atrial fibrillation ([place])    unable to tolerate versed and fentanyl sedation and required gen anesthesia for TEE;  s/p TEE-DCCV (failed);  Amiodarone started  . Chronic diastolic heart failure ([place])    a. Echo 03/27/11: EF 50-55%, inferior hypokinesis,  moderate LAE, mild RVE, normal pulmonary pressures. ;   b.  TEE (04/03/11): EF 40-45%, mild MR moderate LAE, no LAA clot, mild RAE;  c.  Echocardiogram (02/2013): EF 60-65%, Gr 2 DD, mildly dilated Ao root (Ao root dimension 39 mm), MAC, mild LAE, normal RVF  . Diabetes mellitus, type 2 ([place])   . Hx of cardiovascular stress test    Lexiscan Myoview (02/2013): No ischemia or scar, EF 51%, low risk  . Morbid obesity ([place])   . Sleep apnea    a. sleep test 01/2013:  very severy OSA    MEDICATIONS AT HOME: Prior to Admission medications   Medication Sig Start Date End Date Taking? Authorizing Provider  doxycycline (VIBRA-TABS) 100 MG tablet Take 1 tablet (100 mg total) by mouth every 12 (twelve) hours. 09/21/15  Yes Geradine Girt, DO  metFORMIN (GLUCOPHAGE) 1000 MG tablet take 1 tablet by mouth twice a day with food 10/04/15  Yes Argentina Donovan, PA-C  aspirin EC 325 MG tablet Take 1 tablet (325 mg total) by mouth daily. 10/04/15   Argentina Donovan, PA-C     Objective:  EXAM:   Vitals:   10/04/15 1435  BP: 108/71  Pulse: 97  Resp: 16  Temp: 97.9 F (36.6 C)  TempSrc: Oral  SpO2: 95%  Weight: 225 lb 3.2 oz (102.2 kg)    General appearance : A&OX3. NAD. Non-toxic-appearing HEENT: Atraumatic and  Normocephalic.  PERRLA. EOM intact.  TM clear B. Mouth-MMM, post pharynx WNL w/o erythema, No PND. Neck: supple, no JVD. No cervical lymphadenopathy. No thyromegaly Chest/Lungs:  Breathing-non-labored, Good air entry bilaterally, breath sounds normal without rales, rhonchi, or wheezing  CVS: S1 S2 regular, no murmurs, gallops, rubs  Extremities: Bilateral Lower Ext shows no edema, both legs are warm to touch with = pulse throughout.  R elbow-steri strips in place.  No drainage.  Minimal erythema.  Minimal swelling.  Radial pulses=B.  Overall appears to be doing well.  Neurology:  CN II-XII grossly intact, Non focal.   Psych:  TP linear. J/I WNL. Normal speech. Appropriate eye contact and affect.    Skin:  No Rash  Data Review Lab Results  Component Value Date   HGBA1C >15.5 (H) 09/20/2015   HGBA1C 6.6 04/02/2014   HGBA1C 6.9 (H) 07/22/2013     Assessment & Plan   1. Type 2 diabetes mellitus with hyperosmolarity without coma, without long-term current use of insulin (HCC) - Glucose (CBG) - metFORMIN (GLUCOPHAGE) 1000 MG tablet; take 1 tablet by mouth twice a day with food  Dispense: 60 tablet; Refill: 3 - Basic metabolic panel -check BS fasting and at bedtime and record.  Bring to next visit.  Adhere to diabetic diet  2. Non compliance with medical treatment and diet Take meds as directed!!!  3. Hyponatremia Should be improving with improved glycemic control.  - Basic metabolic panel  4. A fib with electrical cardioversion 06/15/2014- Aspirin dose was increased to 325mg  daily while in hospital recently  5. Olecranon bursitis-improving-continue Doxy and keep f/up with ortho  Patient have been counseled extensively about nutrition and exercise  Return in about 5 weeks (around 11/08/2015) for establish with PCP and check diabetes and blood sugar log.  The patient was given clear instructions to go to ER or return to medical center if symptoms don't improve, worsen or new problems develop. The patient verbalized understanding. The patient was told to call to get lab results if they haven't heard anything in the next week.     [name], PA-C [facility] [place], [place]   10/04/2015, 4:55 PM

## 2015-10-05 ENCOUNTER — Telehealth: Payer: Self-pay

## 2015-10-05 NOTE — Telephone Encounter (Signed)
Contacted pt to go over lab results pt did not answer lvm for pt to give me a call back his earliest convenience will try contacting pt again at another day/time

## 2015-10-08 ENCOUNTER — Telehealth: Payer: Self-pay

## 2015-10-08 NOTE — Telephone Encounter (Signed)
Contacted pt to go over lab results pt did not answer lvm making pt aware to give me a call at his earliest conveience to go over lab results also I informed pt that I will mailing letter out today

## 2015-11-07 ENCOUNTER — Ambulatory Visit: Payer: BLUE CROSS/BLUE SHIELD | Admitting: Internal Medicine

## 2015-11-15 ENCOUNTER — Ambulatory Visit: Payer: BLUE CROSS/BLUE SHIELD | Admitting: Internal Medicine

## 2015-12-03 ENCOUNTER — Ambulatory Visit: Payer: BLUE CROSS/BLUE SHIELD | Admitting: Internal Medicine

## 2016-03-14 ENCOUNTER — Other Ambulatory Visit: Payer: Self-pay | Admitting: Physician Assistant

## 2016-03-14 DIAGNOSIS — E1100 Type 2 diabetes mellitus with hyperosmolarity without nonketotic hyperglycemic-hyperosmolar coma (NKHHC): Secondary | ICD-10-CM

## 2016-04-10 DIAGNOSIS — I428 Other cardiomyopathies: Secondary | ICD-10-CM

## 2016-04-10 HISTORY — DX: Other cardiomyopathies: I42.8

## 2016-04-17 ENCOUNTER — Encounter (HOSPITAL_COMMUNITY): Payer: Self-pay

## 2016-04-17 ENCOUNTER — Inpatient Hospital Stay (HOSPITAL_COMMUNITY)
Admission: EM | Admit: 2016-04-17 | Discharge: 2016-04-24 | DRG: 286 | Disposition: A | Discharge status: Home / Self Care | Payer: Medicaid Other | Attending: Internal Medicine | Admitting: Internal Medicine

## 2016-04-17 ENCOUNTER — Emergency Department (HOSPITAL_COMMUNITY): Payer: Medicaid Other

## 2016-04-17 DIAGNOSIS — I340 Nonrheumatic mitral (valve) insufficiency: Secondary | ICD-10-CM | POA: Diagnosis present

## 2016-04-17 DIAGNOSIS — I482 Chronic atrial fibrillation: Principal | ICD-10-CM | POA: Diagnosis present

## 2016-04-17 DIAGNOSIS — Z6835 Body mass index (BMI) 35.0-35.9, adult: Secondary | ICD-10-CM

## 2016-04-17 DIAGNOSIS — R000 Tachycardia, unspecified: Secondary | ICD-10-CM | POA: Diagnosis present

## 2016-04-17 DIAGNOSIS — R748 Abnormal levels of other serum enzymes: Secondary | ICD-10-CM | POA: Diagnosis not present

## 2016-04-17 DIAGNOSIS — G4733 Obstructive sleep apnea (adult) (pediatric): Secondary | ICD-10-CM | POA: Diagnosis present

## 2016-04-17 DIAGNOSIS — Z9114 Patient's other noncompliance with medication regimen: Secondary | ICD-10-CM

## 2016-04-17 DIAGNOSIS — E1100 Type 2 diabetes mellitus with hyperosmolarity without nonketotic hyperglycemic-hyperosmolar coma (NKHHC): Secondary | ICD-10-CM | POA: Diagnosis not present

## 2016-04-17 DIAGNOSIS — I4891 Unspecified atrial fibrillation: Secondary | ICD-10-CM | POA: Diagnosis present

## 2016-04-17 DIAGNOSIS — I429 Cardiomyopathy, unspecified: Secondary | ICD-10-CM | POA: Diagnosis present

## 2016-04-17 DIAGNOSIS — I513 Intracardiac thrombosis, not elsewhere classified: Secondary | ICD-10-CM | POA: Diagnosis present

## 2016-04-17 DIAGNOSIS — E1165 Type 2 diabetes mellitus with hyperglycemia: Secondary | ICD-10-CM | POA: Diagnosis present

## 2016-04-17 DIAGNOSIS — Z7982 Long term (current) use of aspirin: Secondary | ICD-10-CM | POA: Diagnosis not present

## 2016-04-17 DIAGNOSIS — L03115 Cellulitis of right lower limb: Secondary | ICD-10-CM | POA: Diagnosis present

## 2016-04-17 DIAGNOSIS — Z9119 Patient's noncompliance with other medical treatment and regimen: Secondary | ICD-10-CM

## 2016-04-17 DIAGNOSIS — E785 Hyperlipidemia, unspecified: Secondary | ICD-10-CM | POA: Diagnosis present

## 2016-04-17 DIAGNOSIS — I110 Hypertensive heart disease with heart failure: Secondary | ICD-10-CM | POA: Diagnosis present

## 2016-04-17 DIAGNOSIS — I481 Persistent atrial fibrillation: Secondary | ICD-10-CM | POA: Diagnosis present

## 2016-04-17 DIAGNOSIS — Z7984 Long term (current) use of oral hypoglycemic drugs: Secondary | ICD-10-CM

## 2016-04-17 DIAGNOSIS — I480 Paroxysmal atrial fibrillation: Secondary | ICD-10-CM | POA: Diagnosis present

## 2016-04-17 DIAGNOSIS — Z8249 Family history of ischemic heart disease and other diseases of the circulatory system: Secondary | ICD-10-CM

## 2016-04-17 DIAGNOSIS — L03116 Cellulitis of left lower limb: Secondary | ICD-10-CM | POA: Diagnosis present

## 2016-04-17 DIAGNOSIS — Z833 Family history of diabetes mellitus: Secondary | ICD-10-CM | POA: Diagnosis not present

## 2016-04-17 DIAGNOSIS — R778 Other specified abnormalities of plasma proteins: Secondary | ICD-10-CM | POA: Diagnosis present

## 2016-04-17 DIAGNOSIS — B353 Tinea pedis: Secondary | ICD-10-CM | POA: Diagnosis present

## 2016-04-17 DIAGNOSIS — R7989 Other specified abnormal findings of blood chemistry: Secondary | ICD-10-CM | POA: Diagnosis present

## 2016-04-17 DIAGNOSIS — E0800 Diabetes mellitus due to underlying condition with hyperosmolarity without nonketotic hyperglycemic-hyperosmolar coma (NKHHC): Secondary | ICD-10-CM | POA: Diagnosis not present

## 2016-04-17 DIAGNOSIS — I5042 Chronic combined systolic (congestive) and diastolic (congestive) heart failure: Secondary | ICD-10-CM

## 2016-04-17 DIAGNOSIS — I428 Other cardiomyopathies: Secondary | ICD-10-CM | POA: Diagnosis present

## 2016-04-17 DIAGNOSIS — I5032 Chronic diastolic (congestive) heart failure: Secondary | ICD-10-CM

## 2016-04-17 DIAGNOSIS — I5043 Acute on chronic combined systolic (congestive) and diastolic (congestive) heart failure: Secondary | ICD-10-CM | POA: Diagnosis not present

## 2016-04-17 DIAGNOSIS — I509 Heart failure, unspecified: Secondary | ICD-10-CM | POA: Diagnosis not present

## 2016-04-17 DIAGNOSIS — I248 Other forms of acute ischemic heart disease: Secondary | ICD-10-CM | POA: Diagnosis not present

## 2016-04-17 DIAGNOSIS — I5023 Acute on chronic systolic (congestive) heart failure: Secondary | ICD-10-CM | POA: Diagnosis present

## 2016-04-17 DIAGNOSIS — E119 Type 2 diabetes mellitus without complications: Secondary | ICD-10-CM

## 2016-04-17 LAB — CBC
HCT: 45.4 % (ref 39.0–52.0)
HEMOGLOBIN: 15.2 g/dL (ref 13.0–17.0)
MCH: 28.3 pg (ref 26.0–34.0)
MCHC: 33.5 g/dL (ref 30.0–36.0)
MCV: 84.4 fL (ref 78.0–100.0)
Platelets: 234 10*3/uL (ref 150–400)
RBC: 5.38 MIL/uL (ref 4.22–5.81)
RDW: 15.3 % (ref 11.5–15.5)
WBC: 7.6 10*3/uL (ref 4.0–10.5)

## 2016-04-17 LAB — BASIC METABOLIC PANEL
Anion gap: 11 (ref 5–15)
BUN: 18 mg/dL (ref 6–20)
CO2: 26 mmol/L (ref 22–32)
Calcium: 8.8 mg/dL — ABNORMAL LOW (ref 8.9–10.3)
Chloride: 100 mmol/L — ABNORMAL LOW (ref 101–111)
Creatinine, Ser: 1.15 mg/dL (ref 0.61–1.24)
GFR calc Af Amer: >60 mL/min (ref >60)
GFR calc non Af Amer: >60 mL/min (ref >60)
Glucose, Bld: 72 mg/dL (ref 65–99)
Potassium: 3.9 mmol/L (ref 3.5–5.1)
Sodium: 137 mmol/L (ref 135–145)

## 2016-04-17 LAB — I-STAT TROPONIN, ED: Troponin i, poc: 1.73 ng/mL (ref 0.00–0.08)

## 2016-04-17 LAB — PROTIME-INR
INR: 1.22
Prothrombin Time: 15.5 seconds — ABNORMAL HIGH (ref 11.4–15.2)

## 2016-04-17 LAB — BRAIN NATRIURETIC PEPTIDE: B Natriuretic Peptide: 659.8 pg/mL — ABNORMAL HIGH (ref 0.0–100.0)

## 2016-04-17 MED ORDER — HEPARIN BOLUS VIA INFUSION
5000.0000 [IU] | Freq: Once | INTRAVENOUS | Status: AC
  Administered 2016-04-17: 5000 [IU] via INTRAVENOUS
  Filled 2016-04-17: qty 5000

## 2016-04-17 MED ORDER — FUROSEMIDE 10 MG/ML IJ SOLN
40.0000 mg | Freq: Two times a day (BID) | INTRAMUSCULAR | Status: DC
  Administered 2016-04-18 – 2016-04-19 (×4): 40 mg via INTRAVENOUS
  Filled 2016-04-17 (×4): qty 4

## 2016-04-17 MED ORDER — RIVAROXABAN 15 MG PO TABS: 15.0000 mg | ORAL_TABLET | Freq: Once | ORAL | Status: DC

## 2016-04-17 MED ORDER — ASPIRIN EC 81 MG PO TBEC
81.0000 mg | DELAYED_RELEASE_TABLET | Freq: Every day | ORAL | Status: DC
  Administered 2016-04-18 – 2016-04-20 (×3): 81 mg via ORAL
  Filled 2016-04-17 (×3): qty 1

## 2016-04-17 MED ORDER — DILTIAZEM LOAD VIA INFUSION
20.0000 mg | Freq: Once | INTRAVENOUS | Status: AC
  Administered 2016-04-17: 20 mg via INTRAVENOUS
  Filled 2016-04-17: qty 20

## 2016-04-17 MED ORDER — ACETAMINOPHEN 650 MG RE SUPP: 650.0000 mg | Freq: Four times a day (QID) | RECTAL | Status: DC | PRN

## 2016-04-17 MED ORDER — ASPIRIN 81 MG PO CHEW
324.0000 mg | CHEWABLE_TABLET | Freq: Once | ORAL | Status: AC
Start: 2016-04-17 — End: 2016-04-17
  Administered 2016-04-17: 324 mg via ORAL
  Filled 2016-04-17: qty 4

## 2016-04-17 MED ORDER — LISINOPRIL 2.5 MG PO TABS
2.5000 mg | ORAL_TABLET | Freq: Every day | ORAL | Status: DC
  Administered 2016-04-18 – 2016-04-19 (×2): 2.5 mg via ORAL
  Filled 2016-04-17 (×2): qty 1

## 2016-04-17 MED ORDER — HEPARIN (PORCINE) IN NACL 100-0.45 UNIT/ML-% IJ SOLN
2000.0000 [IU]/h | INTRAMUSCULAR | Status: DC
  Administered 2016-04-17: 1600 [IU]/h via INTRAVENOUS
  Administered 2016-04-18 (×2): 2000 [IU]/h via INTRAVENOUS
  Filled 2016-04-17 (×2): qty 250

## 2016-04-17 MED ORDER — DILTIAZEM HCL 100 MG IV SOLR
5.0000 mg/h | INTRAVENOUS | Status: DC
  Administered 2016-04-17 – 2016-04-18 (×2): 5 mg/h via INTRAVENOUS
  Filled 2016-04-17 (×2): qty 100

## 2016-04-17 MED ORDER — ONDANSETRON HCL 4 MG/2ML IJ SOLN: 4.0000 mg | Freq: Four times a day (QID) | INTRAMUSCULAR | Status: DC | PRN

## 2016-04-17 MED ORDER — LORAZEPAM 2 MG/ML IJ SOLN
0.5000 mg | Freq: Once | INTRAMUSCULAR | Status: AC
  Administered 2016-04-17: 0.5 mg via INTRAVENOUS
  Filled 2016-04-17: qty 1

## 2016-04-17 MED ORDER — ONDANSETRON HCL 4 MG PO TABS: 4.0000 mg | ORAL_TABLET | Freq: Four times a day (QID) | ORAL | Status: DC | PRN

## 2016-04-17 MED ORDER — FUROSEMIDE 10 MG/ML IJ SOLN
40.0000 mg | Freq: Once | INTRAMUSCULAR | Status: AC
  Administered 2016-04-17: 40 mg via INTRAVENOUS
  Filled 2016-04-17: qty 4

## 2016-04-17 MED ORDER — INSULIN ASPART 100 UNIT/ML ~~LOC~~ SOLN
0.0000 [IU] | Freq: Three times a day (TID) | SUBCUTANEOUS | Status: DC
  Administered 2016-04-19 – 2016-04-20 (×2): 2 [IU] via SUBCUTANEOUS
  Administered 2016-04-20: 1 [IU] via SUBCUTANEOUS
  Administered 2016-04-21: 2 [IU] via SUBCUTANEOUS
  Administered 2016-04-22: 1 [IU] via SUBCUTANEOUS
  Administered 2016-04-23: 2 [IU] via SUBCUTANEOUS

## 2016-04-17 MED ORDER — ACETAMINOPHEN 325 MG PO TABS
650.0000 mg | ORAL_TABLET | Freq: Four times a day (QID) | ORAL | Status: DC | PRN
Start: 2016-04-17 — End: 2016-04-18

## 2016-04-17 MED ORDER — POTASSIUM CHLORIDE 20 MEQ/15ML (10%) PO SOLN
20.0000 meq | Freq: Every day | ORAL | Status: DC
  Administered 2016-04-18: 20 meq via ORAL
  Filled 2016-04-17: qty 15

## 2016-04-17 NOTE — ED Provider Notes (Signed)
[facility] DEPT Provider Note   CSN: [id number] Arrival date & time: 04/17/16  1425     History   Chief Complaint Chief Complaint  Patient presents with  . Atrial Fibrillation    HPI Darius [name] is a 61 y.o. male.  HPI Patient reports he's had shortness of breath for almost 2 weeks. He reports on exertion he is getting pretty short of breath now with minor activities. He states he can't lie flat at night because he becomes short of breath and wakes up gasping. He denies he's had chest pain. He reports he is getting swelling in his legs. He went to urgent care to get evaluated today. He reports that he has had atrial fibrillation in the past but it has been quite a few years ago. He reports that he was electively cardioverted and it never came back. He states he takes a daily aspirin. Patient is also diabetic. He takes metformin. He denies he's had fever or cough. He reports he has developed a rash on his feet that came up about a week ago. He reports it started as blisters but now its predominately red and dried scabs. He reports itchy but not painful.  Patient is a nonsmoker. He has very rare alcohol use socially. Past Medical History:  Diagnosis Date  . Atrial fibrillation ([place])    unable to tolerate versed and fentanyl sedation and required gen anesthesia for TEE;  s/p TEE-DCCV (failed);  Amiodarone started  . Chronic diastolic heart failure ([place])    a. Echo 03/27/11: EF 50-55%, inferior hypokinesis, moderate LAE, mild RVE, normal pulmonary pressures. ;   b.  TEE (04/03/11): EF 40-45%, mild MR moderate LAE, no LAA clot, mild RAE;  c.  Echocardiogram (02/2013): EF 60-65%, Gr 2 DD, mildly dilated Ao root (Ao root dimension 39 mm), MAC, mild LAE, normal RVF  . Diabetes mellitus, type 2 ([place])   . Hx of cardiovascular stress test    Lexiscan Myoview (02/2013): No ischemia or scar, EF 51%, low risk  . Morbid obesity ([place])   . Sleep apnea    a. sleep test 01/2013:  very severy OSA     Patient Active Problem List   Diagnosis Date Noted  . Septic olecranon bursitis of right elbow 09/19/2015  . Lactic acid acidosis   . Dilated cardiomyopathy ([place]) 04/19/2014  . Type 2 diabetes mellitus ([place]) 04/09/2014  . Non compliance with medical treatment and diet 04/09/2014  . Chronic anticoagulation-Eliquis started 04/06/14 04/09/2014  . Abnormal chest CT-LUL nodule- needs f/u May 2016 04/09/2014  . Chronic combined systolic and diastolic heart failure ([place]) 04/05/2014  . OSA - C-pap 03/22/2013  . Persistent atrial fibrillation ([place]) 03/27/2011  . Obesity, Class III, BMI 40-49.9 (morbid obesity) ([place]) 03/27/2011  . URI, acute 03/27/2011    Past Surgical History:  Procedure Laterality Date  . CARDIOVERSION  04/01/2011   Procedure: CARDIOVERSION;  Surgeon: Lelon Perla, MD;  Location[facility] ENDOSCOPY;  Service: Cardiovascular;  Laterality: N/A;  . CARDIOVERSION  04/03/2011   Procedure: CARDIOVERSION;  Surgeon: Lelon Perla, MD;  Location: [facility] ENDOSCOPY;  Service: Cardiovascular;  Laterality: N/A;  . CARDIOVERSION  04/03/2011   Procedure: CARDIOVERSION;  Surgeon: Lelon Perla, MD;  Location: [facility];  Service: Cardiovascular;  Laterality: N/A;  . CARDIOVERSION N/A 06/15/2014   Procedure: CARDIOVERSION;  Surgeon: [name], MD;  Location: [place];  Service: Cardiovascular;  Laterality: N/A;  . I&D EXTREMITY Right 09/19/2015   Procedure: IRRIGATION AND DEBRIDEMENT EXTREMITY;  Surgeon: Mcarthur Rossetti, MD;  Location: [place];  Service: Orthopedics;  Laterality: Right;  . TEE WITHOUT CARDIOVERSION  04/01/2011   Procedure: TRANSESOPHAGEAL ECHOCARDIOGRAM (TEE);  Surgeon: Lelon Perla, MD;  Location[facility] ENDOSCOPY;  Service: Cardiovascular;  Laterality: N/A;  . TEE WITHOUT CARDIOVERSION  04/03/2011   Procedure: TRANSESOPHAGEAL ECHOCARDIOGRAM (TEE);  Surgeon: Lelon Perla, MD;  Location: [facility] ENDOSCOPY;  Service: Cardiovascular;  Laterality: N/A;  . TEE WITHOUT  CARDIOVERSION  04/03/2011   Procedure: TRANSESOPHAGEAL ECHOCARDIOGRAM (TEE);  Surgeon: Lelon Perla, MD;  Location: [facility] OR;  Service: Cardiovascular;  Laterality: N/A;       Home Medications    Prior to Admission medications   Medication Sig Start Date End Date Taking? Authorizing Provider  aspirin EC 325 MG tablet Take 1 tablet (325 mg total) by mouth daily. 10/04/15   Argentina Donovan, PA-C  doxycycline (VIBRA-TABS) 100 MG tablet Take 1 tablet (100 mg total) by mouth every 12 (twelve) hours. 09/21/15   Geradine Girt, DO  metFORMIN (GLUCOPHAGE) 1000 MG tablet take 1 tablet by mouth twice a day with food 10/04/15   Argentina Donovan, PA-C    Family History Family History  Problem Relation Age of Onset  . Diabetes Father     died in his 66s  . Hypertension Father     died in her 13s  . Seizures Mother   . Hypertension Sister   . Hypertension Paternal Grandfather   . Heart attack Neg Hx   . Stroke Neg Hx     Social History Social History  Substance Use Topics  . Smoking status: Never Smoker  . Smokeless tobacco: Never Used  . Alcohol use Yes     Comment: some     Allergies   Patient has no known allergies.   Review of Systems Review of Systems 10 Systems reviewed and are negative for acute change except as noted in the HPI.   Physical Exam Updated Vital Signs BP (!) 145/103   Pulse (!) 42   Temp 97.5 F (36.4 C) (Oral)   Resp 19   Ht _0  (1.88 m)   Wt (!) 310 lb (140.6 kg)   SpO2 94%   BMI 39.80 kg/m   Physical Exam  Constitutional: He is oriented to person, place, and time.  Patient is very deconditioned and moderately obese. He is alert and nontoxic. Mild tachypnea.  HENT:  Head: Normocephalic and atraumatic.  Mouth/Throat: Oropharynx is clear and moist.  Very poor condition of dentition.  Eyes: EOM are normal. Pupils are equal, round, and reactive to light.  Neck: Neck supple.  Cardiovascular: Intact distal pulses.   Tachycardia irregularly  irregular. Bilateral dorsalis pedis pulses are 2+ and strong.  Pulmonary/Chest:  Tachypnea. Patient is however speaking in full sentences and shows no signs of imminent respiratory failure. Rails to approximately one third bilateral lower lung fields.  Abdominal: Soft. He exhibits no distension. There is no tenderness. There is no guarding.  Musculoskeletal:  2+ pitting edema bilateral lower extremities. No calf tenderness. Patient has rash on bilateral feet that is erythematous and scaling. There are small flat dry eschar's associated. 2+ dorsalis pedis pulses symmetric. Poor condition toenails with significant onychomycosis.  Neurological: He is alert and oriented to person, place, and time. No cranial nerve deficit. He exhibits normal muscle tone. Coordination normal.  Skin: Skin is warm and dry.  Psychiatric: He has a normal mood and affect.     ED Treatments /  Results  Labs (all labs ordered are listed, but only abnormal results are displayed) Labs Reviewed  BASIC METABOLIC PANEL - Abnormal; Notable for the following:       Result Value   Chloride 100 (*)    Calcium 8.8 (*)    All other components within normal limits  PROTIME-INR - Abnormal; Notable for the following:    Prothrombin Time 15.5 (*)    All other components within normal limits  I-STAT TROPOININ, ED - Abnormal; Notable for the following:    Troponin i, poc 1.73 (*)    All other components within normal limits  CBC  BRAIN NATRIURETIC PEPTIDE    EKG  EKG Interpretation  Date/Time:  Thursday April 17 2016 14:47:51 EST Ventricular Rate:  142 PR Interval:    QRS Duration: 86 QT Interval:  368 QTC Calculation: 566 R Axis:   95 Text Interpretation:  Atrial fibrillation with rapid ventricular response Rightward axis Low voltage QRS Cannot rule out Anteroseptal infarct , age undetermined Abnormal ECG agree. no sig QRS  or T wave morphology change compared to old Confirmed by Johnney Killian, MD, Jeannie Done [id number]) on 04/17/2016  6:14:08 PM       Radiology Dg Chest 2 View  Result Date: 04/17/2016 CLINICAL DATA:  Shortness of breath with exertion. History of atrial fibrillation. EXAM: CHEST  2 VIEW COMPARISON:  PA and lateral chest 04/02/2014 and CT chest 04/03/2014. FINDINGS: There is cardiomegaly and mild interstitial edema. Small left effusion is identified. No consolidative process or pneumothorax. IMPRESSION: Cardiomegaly mild interstitial edema with a small left pleural effusion. Electronically Signed   By: Inge Rise M.D.   On: 04/17/2016 15:20    Procedures Procedures (including critical care time)  Medications Ordered in ED Medications  diltiazem (CARDIZEM) 1 mg/mL load via infusion 20 mg (20 mg Intravenous Bolus from Bag 04/17/16 1707)    And  diltiazem (CARDIZEM) 100 mg in dextrose 5 % 100 mL (1 mg/mL) infusion (15 mg/hr Intravenous Rate/Dose Change 04/17/16 1752)  heparin bolus via infusion 5,000 Units (not administered)  heparin ADULT infusion 100 units/mL (25000 units/297m sodium chloride 0.45%) (not administered)  aspirin chewable tablet 324 mg (324 mg Oral Given 04/17/16 1745)  LORazepam (ATIVAN) injection 0.5 mg (0.5 mg Intravenous Given 04/17/16 1745)     Initial Impression / Assessment and Plan / ED Course  I have reviewed the triage vital signs and the nursing notes.  Pertinent labs & imaging results that were available during my care of the patient were reviewed by me and considered in my medical decision making (see chart for details).      Final Clinical Impressions(s) / ED Diagnoses   Final diagnoses:  Atrial fibrillation with RVR ([place]  Acute congestive heart failure, unspecified congestive heart failure type ([facility]  Patient has had dyspnea for almost 2 weeks. Patient likely has had atrial fibrillation for that period of time. He has a distant history of atrial fibrillation but has not had it chronically that he is aware of. Patient is not chronically anticoagulated but takes a  daily aspirin. He denies associated chest pain. He however has significant dyspnea on exertion as well as orthopnea. Findings consistent with congestive heart failure with atrophic relation rapid ventricular response. EKG does not show STEMI pattern. I suspect elevated troponin is secondary to myocardial stress. Patient is started on heparin for atrial fibrillation and concomitant elevated troponin. Patient did not want to be admitted to the hospital due to the expense (and still owing $7000 from  his prior hospitalization) and need to care for animals at home. I have however advised him that he is not in appropriate condition for initiating outpatient treatment of congestive heart failure and age of fibrillation with rapid ventricular response.  New Prescriptions New Prescriptions   No medications on file     Charlesetta Shanks, MD 04/17/16 1818

## 2016-04-17 NOTE — H&P (Signed)
History and Physical    Darius [name] KDX:[id number] DOB: 09/07/1955 DOA: 04/17/2016  PCP: [name]., DO  Patient coming from: Home.  Chief Complaint: Palpitations and shortness of breath.  HPI: Darius [name] is a 61 y.o. male with history of chronic combined systolic and diastolic CHF, atrial fibrillation, diabetes mellitus who has not been taking his cardiac medications for last 2 years presents to the ER because of worsening shortness of breath on exertion with palpitations. Patient has been having these symptoms for last 2 weeks. Denies any chest pain. Denies any fever chills or productive cough. Patient also noted increasing swelling of the lower extremity.   ED Course: In the ER patient was found to be in A. fib with RVR and was started on Cardizem infusion after bolus. Chest x-ray shows congestion and BNP levels around 659 troponin mildly elevated. Patient also was given Lasix 40 mg IV. On exam patient is not in distress still in A. fib with RVR.  Review of Systems: As per HPI, rest all negative.   Past Medical History:  Diagnosis Date  . Atrial fibrillation ([place])    unable to tolerate versed and fentanyl sedation and required gen anesthesia for TEE;  s/p TEE-DCCV (failed);  Amiodarone started  . Chronic diastolic heart failure ([place])    a. Echo 03/27/11: EF 50-55%, inferior hypokinesis, moderate LAE, mild RVE, normal pulmonary pressures. ;   b.  TEE (04/03/11): EF 40-45%, mild MR moderate LAE, no LAA clot, mild RAE;  c.  Echocardiogram (02/2013): EF 60-65%, Gr 2 DD, mildly dilated Ao root (Ao root dimension 39 mm), MAC, mild LAE, normal RVF  . Diabetes mellitus, type 2 ([place])   . Hx of cardiovascular stress test    Lexiscan Myoview (02/2013): No ischemia or scar, EF 51%, low risk  . Morbid obesity ([place])   . Sleep apnea    a. sleep test 01/2013:  very severy OSA    Past Surgical History:  Procedure Laterality Date  . CARDIOVERSION  04/01/2011   Procedure: CARDIOVERSION;   Surgeon: Lelon Perla, MD;  Location: [facility] ENDOSCOPY;  Service: Cardiovascular;  Laterality: N/A;  . CARDIOVERSION  04/03/2011   Procedure: CARDIOVERSION;  Surgeon: Lelon Perla, MD;  Location: [facility] ENDOSCOPY;  Service: Cardiovascular;  Laterality: N/A;  . CARDIOVERSION  04/03/2011   Procedure: CARDIOVERSION;  Surgeon: Lelon Perla, MD;  Location: [place];  Service: Cardiovascular;  Laterality: N/A;  . CARDIOVERSION N/A 06/15/2014   Procedure: CARDIOVERSION;  Surgeon: [name], MD;  Location: [place];  Service: Cardiovascular;  Laterality: N/A;  . I&D EXTREMITY Right 09/19/2015   Procedure: IRRIGATION AND DEBRIDEMENT EXTREMITY;  Surgeon: Mcarthur Rossetti, MD;  Location: [place];  Service: Orthopedics;  Laterality: Right;  . TEE WITHOUT CARDIOVERSION  04/01/2011   Procedure: TRANSESOPHAGEAL ECHOCARDIOGRAM (TEE);  Surgeon: Lelon Perla, MD;  Location: [facility] ENDOSCOPY;  Service: Cardiovascular;  Laterality: N/A;  . TEE WITHOUT CARDIOVERSION  04/03/2011   Procedure: TRANSESOPHAGEAL ECHOCARDIOGRAM (TEE);  Surgeon: Lelon Perla, MD;  Location: [facility] ENDOSCOPY;  Service: Cardiovascular;  Laterality: N/A;  . TEE WITHOUT CARDIOVERSION  04/03/2011   Procedure: TRANSESOPHAGEAL ECHOCARDIOGRAM (TEE);  Surgeon: Lelon Perla, MD;  Location: [place];  Service: Cardiovascular;  Laterality: N/A;     reports that he has never smoked. He has never used smokeless tobacco. He reports that he drinks alcohol. He reports that he does not use drugs.  No Known Allergies  Family History  Problem Relation Age of  Onset  . Diabetes Father     died in his 90s  . Hypertension Father     died in her 32s  . Seizures Mother   . Hypertension Sister   . Hypertension Paternal Grandfather   . Heart attack Neg Hx   . Stroke Neg Hx     Prior to Admission medications   Medication Sig Start Date End Date Taking? Authorizing Provider  aspirin EC 325 MG tablet Take 1 tablet (325 mg total) by mouth daily.  10/04/15  Yes Argentina Donovan, PA-C  metFORMIN (GLUCOPHAGE) 1000 MG tablet take 1 tablet by mouth twice a day with food 10/04/15  Yes Argentina Donovan, [place]    Physical Exam: Vitals:   04/17/16 2030 04/17/16 2045 04/17/16 2100 04/17/16 2200  BP: 112/97  111/86 (!) 123/102  Pulse: 62 110 (!) 154 (!) 108  Resp: (!) 28 (!) 28 (!) 28 (!) 24  Temp:    97.4 F (36.3 C)  TempSrc:    Oral  SpO2: 94% 92% 94% 94%  Weight:    (!) 136.8 kg (301 lb 9.4 oz)  Height:    6' 2"  (1.88 m)      Constitutional: Moderately built and nourished. Vitals:   04/17/16 2030 04/17/16 2045 04/17/16 2100 04/17/16 2200  BP: 112/97  111/86 (!) 123/102  Pulse: 62 110 (!) 154 (!) 108  Resp: (!) 28 (!) 28 (!) 28 (!) 24  Temp:    97.4 F (36.3 C)  TempSrc:    Oral  SpO2: 94% 92% 94% 94%  Weight:    (!) 136.8 kg (301 lb 9.4 oz)  Height:    6' 2"  (1.88 m)   Eyes: Anicteric no pallor. ENMT: No discharge from the ears eyes nose and mouth. Neck: No JVD appreciated no mass felt. Respiratory: No rhonchi basilar crepitations. Cardiovascular: S1-S2 heard no murmurs appreciated. Abdomen: Soft nontender bowel sounds present. Musculoskeletal: No edema. No joint effusion. Skin: No rash. Skin appears warm. Neurologic: Alert awake oriented to time place and person. Moves all extremities. Psychiatric: Appears normal. Normal affect.   Labs on Admission: I have personally reviewed following labs and imaging studies  CBC:  Recent Labs Lab 04/17/16 1713  WBC 7.6  HGB 15.2  HCT 45.4  MCV 84.4  PLT 270   Basic Metabolic Panel:  Recent Labs Lab 04/17/16 1713  NA 137  K 3.9  CL 100*  CO2 26  GLUCOSE 72  BUN 18  CREATININE 1.15  CALCIUM 8.8*   GFR: Estimated Creatinine Clearance: 98 mL/min (by C-G formula based on SCr of 1.15 mg/dL). Liver Function Tests: No results for input(s): AST, ALT, ALKPHOS, BILITOT, PROT, ALBUMIN in the last 168 hours. No results for input(s): LIPASE, AMYLASE in the last 168  hours. No results for input(s): AMMONIA in the last 168 hours. Coagulation Profile:  Recent Labs Lab 04/17/16 1713  INR 1.22   Cardiac Enzymes: No results for input(s): CKTOTAL, CKMB, CKMBINDEX, TROPONINI in the last 168 hours. BNP (last 3 results) No results for input(s): PROBNP in the last 8760 hours. HbA1C: No results for input(s): HGBA1C in the last 72 hours. CBG: No results for input(s): GLUCAP in the last 168 hours. Lipid Profile: No results for input(s): CHOL, HDL, LDLCALC, TRIG, CHOLHDL, LDLDIRECT in the last 72 hours. Thyroid Function Tests: No results for input(s): TSH, T4TOTAL, FREET4, T3FREE, THYROIDAB in the last 72 hours. Anemia Panel: No results for input(s): VITAMINB12, FOLATE, FERRITIN, TIBC, IRON, RETICCTPCT in the last  72 hours. Urine analysis:    Component Value Date/Time   COLORURINE YELLOW 09/19/2015 0420   APPEARANCEUR CLEAR 09/19/2015 0420   LABSPEC 1.030 09/19/2015 0420   PHURINE 5.5 09/19/2015 0420   GLUCOSEU >1000 (A) 09/19/2015 0420   HGBUR NEGATIVE 09/19/2015 0420   BILIRUBINUR NEGATIVE 09/19/2015 0420   KETONESUR >80 (A) 09/19/2015 0420   PROTEINUR NEGATIVE 09/19/2015 0420   UROBILINOGEN 1.0 03/26/2011 2017   NITRITE NEGATIVE 09/19/2015 0420   LEUKOCYTESUR NEGATIVE 09/19/2015 0420   Sepsis Labs: @LABRCNTIP (procalcitonin:4,lacticidven:4) )No results found for this or any previous visit (from the past 240 hour(s)).   Radiological Exams on Admission: Dg Chest 2 View  Result Date: 04/17/2016 CLINICAL DATA:  Shortness of breath with exertion. History of atrial fibrillation. EXAM: CHEST  2 VIEW COMPARISON:  PA and lateral chest 04/02/2014 and CT chest 04/03/2014. FINDINGS: There is cardiomegaly and mild interstitial edema. Small left effusion is identified. No consolidative process or pneumothorax. IMPRESSION: Cardiomegaly mild interstitial edema with a small left pleural effusion. Electronically Signed   By: Inge Rise M.D.   On: 04/17/2016  15:20    EKG: Independently reviewed. A. fib with RVR.  Assessment/Plan Active Problems:   OSA - C-pap   Acute on chronic combined systolic and diastolic heart failure (HCC)   Type 2 diabetes mellitus (HCC)   Atrial fibrillation with RVR (HCC)   Elevated troponin   CHF (congestive heart failure) ([place])    1. A. fib with RVR - patient has been started on Cardizem infusion. Once patient has diuresed well with start patient on metoprolol. Patient used to be on amiodarone per cardiology notes previously. Check TSH. Check 2-D echo. Cycle cardiac markers. Chads 2 vasc score is 2 for CHF and diabetes mellitus. 2. Acute on chronic combined CHF - probably precipitated by #1. Check 2-D echo. Last EF measured was 45%. I have placed patient on Lasix 40 mg IV every 12 along with lisinopril. Patient has gained at least 80 pounds over the last 10 months. 3. Elevated troponin/non-ST elevation MI most likely precipitated by A. fib with RVR and CHF - will cycle cardiac markers check 2-D echo patient is on heparin and aspirin and will place patient on statins. Consult cardiologist in a.m. Patient is chest pain-free at this time. 4. Diabetes mellitus type 2 - patient will be on sliding scale coverage.   DVT prophylaxis: Heparin infusion. Code Status: Full code.  Family Communication: Discussed with patient.  Disposition Plan: Home.  Consults called: None.  Admission status: Inpatient.    Rise Patience MD [facility] Pager [phone].  If 7PM-7AM, please contact night-coverage [email or web address] Password [email or web address]  04/17/2016, 11:20 PM

## 2016-04-17 NOTE — ED Triage Notes (Signed)
Per Pt, Pt is coming from PCP after he was seen for bilateral leg pain and swelling. Pt reports EKG completed and he was reported to be in atrial fibrillation. Complains of SOB. Denies pain.

## 2016-04-17 NOTE — Progress Notes (Signed)
ANTICOAGULATION CONSULT NOTE - Initial Consult  Pharmacy Consult for Heparin Indication: atrial fibrillation  No Known Allergies  Patient Measurements: Height: _0  (188 cm) Weight: (!) 310 lb (140.6 kg) IBW/kg (Calculated) : 82.2 Heparin Dosing Weight: 114kg  Vital Signs: Temp: 97.5 F (36.4 C) (03/08 1452) Temp Source: Oral (03/08 1452) BP: 146/115 (03/08 1715) Pulse Rate: 81 (03/08 1715)  Labs:  Recent Labs  04/17/16 1713  HGB 15.2  HCT 45.4  PLT 234  LABPROT 15.5*  INR 1.22  CREATININE 1.15    Estimated Creatinine Clearance: 99.5 mL/min (by C-G formula based on SCr of 1.15 mg/dL).   Medical History: Past Medical History:  Diagnosis Date  . Atrial fibrillation ([place])    unable to tolerate versed and fentanyl sedation and required gen anesthesia for TEE;  s/p TEE-DCCV (failed);  Amiodarone started  . Chronic diastolic heart failure ([place])    a. Echo 03/27/11: EF 50-55%, inferior hypokinesis, moderate LAE, mild RVE, normal pulmonary pressures. ;   b.  TEE (04/03/11): EF 40-45%, mild MR moderate LAE, no LAA clot, mild RAE;  c.  Echocardiogram (02/2013): EF 60-65%, Gr 2 DD, mildly dilated Ao root (Ao root dimension 39 mm), MAC, mild LAE, normal RVF  . Diabetes mellitus, type 2 ([place])   . Hx of cardiovascular stress test    Lexiscan Myoview (02/2013): No ischemia or scar, EF 51%, low risk  . Morbid obesity ([place])   . Sleep apnea    a. sleep test 01/2013:  very severy OSA    Medications:   (Not in a hospital admission) Scheduled:  . aspirin  324 mg Oral Once  . LORazepam  0.5 mg Intravenous Once   Infusions:  . diltiazem (CARDIZEM) infusion 10 mg/hr (04/17/16 1737)   PRN:  Anti-infectives    None      Assessment: Patient is a 19 yom who came to ED 3/8 from PCP's office after being told he was in A Fib. He was not on any anticoagulation prior to this. CHADS VASc at least 2 (CHF, DM).  Baseline CBC is within normal limits and INR 1.22.   Goal of Therapy:   Heparin level 0.3-0.7 units/ml Monitor platelets by anticoagulation protocol: Yes   Plan:  Heparin 5000 unit bolus x1, then 1600 units/hr  6-hr heparin level Daily heparin level and CBC F/U plans for oral anticoagulation   Demetrius Charity, PharmD Acute Care Pharmacy Resident  Pager: [phone] 04/17/2016

## 2016-04-18 ENCOUNTER — Other Ambulatory Visit (HOSPITAL_COMMUNITY): Payer: BLUE CROSS/BLUE SHIELD

## 2016-04-18 DIAGNOSIS — I5043 Acute on chronic combined systolic (congestive) and diastolic (congestive) heart failure: Secondary | ICD-10-CM

## 2016-04-18 DIAGNOSIS — E0800 Diabetes mellitus due to underlying condition with hyperosmolarity without nonketotic hyperglycemic-hyperosmolar coma (NKHHC): Secondary | ICD-10-CM

## 2016-04-18 DIAGNOSIS — R748 Abnormal levels of other serum enzymes: Secondary | ICD-10-CM

## 2016-04-18 DIAGNOSIS — I4891 Unspecified atrial fibrillation: Secondary | ICD-10-CM

## 2016-04-18 DIAGNOSIS — I248 Other forms of acute ischemic heart disease: Secondary | ICD-10-CM

## 2016-04-18 LAB — COMPREHENSIVE METABOLIC PANEL
ALBUMIN: 3.2 g/dL — AB (ref 3.5–5.0)
ALK PHOS: 68 U/L (ref 38–126)
ALT: 36 U/L (ref 17–63)
AST: 46 U/L — AB (ref 15–41)
Anion gap: 7 (ref 5–15)
BILIRUBIN TOTAL: 1 mg/dL (ref 0.3–1.2)
BUN: 15 mg/dL (ref 6–20)
CALCIUM: 8.3 mg/dL — AB (ref 8.9–10.3)
CO2: 28 mmol/L (ref 22–32)
Chloride: 101 mmol/L (ref 101–111)
Creatinine, Ser: 1.07 mg/dL (ref 0.61–1.24)
GFR calc Af Amer: >60 mL/min (ref >60)
GFR calc non Af Amer: >60 mL/min (ref >60)
GLUCOSE: 90 mg/dL (ref 65–99)
Potassium: 3.4 mmol/L — ABNORMAL LOW (ref 3.5–5.1)
SODIUM: 136 mmol/L (ref 135–145)
TOTAL PROTEIN: 6.1 g/dL — AB (ref 6.5–8.1)

## 2016-04-18 LAB — CBC WITH DIFFERENTIAL/PLATELET
BASOS ABS: 0 10*3/uL (ref 0.0–0.1)
BASOS PCT: 0 %
EOS PCT: 2 %
Eosinophils Absolute: 0.2 10*3/uL (ref 0.0–0.7)
HEMATOCRIT: 47.6 % (ref 39.0–52.0)
Hemoglobin: 15.9 g/dL (ref 13.0–17.0)
Lymphocytes Relative: 36 %
Lymphs Abs: 3.1 10*3/uL (ref 0.7–4.0)
MCH: 28.4 pg (ref 26.0–34.0)
MCHC: 33.4 g/dL (ref 30.0–36.0)
MCV: 85 fL (ref 78.0–100.0)
MONOS PCT: 11 %
Monocytes Absolute: 0.9 10*3/uL (ref 0.1–1.0)
NEUTROS ABS: 4.2 10*3/uL (ref 1.7–7.7)
Neutrophils Relative %: 51 %
PLATELETS: 245 10*3/uL (ref 150–400)
RBC: 5.6 MIL/uL (ref 4.22–5.81)
RDW: 15.3 % (ref 11.5–15.5)
WBC: 8.4 10*3/uL (ref 4.0–10.5)

## 2016-04-18 LAB — TROPONIN I
TROPONIN I: 1.85 ng/mL — AB (ref <0.03)
Troponin I: 2.1 ng/mL (ref <0.03)
Troponin I: 1.67 ng/mL (ref <0.03)

## 2016-04-18 LAB — HIV ANTIBODY (ROUTINE TESTING W REFLEX): HIV Screen 4th Generation wRfx: NONREACTIVE

## 2016-04-18 LAB — GLUCOSE, CAPILLARY
GLUCOSE-CAPILLARY: 89 mg/dL (ref 65–99)
GLUCOSE-CAPILLARY: 90 mg/dL (ref 65–99)
GLUCOSE-CAPILLARY: 89 mg/dL (ref 65–99)
Glucose-Capillary: 125 mg/dL — ABNORMAL HIGH (ref 65–99)

## 2016-04-18 LAB — MAGNESIUM: Magnesium: 1.7 mg/dL (ref 1.7–2.4)

## 2016-04-18 LAB — LIPID PANEL
CHOLESTEROL: 130 mg/dL (ref 0–200)
HDL: 35 mg/dL — ABNORMAL LOW (ref >40)
LDL Cholesterol: 78 mg/dL (ref 0–99)
Total CHOL/HDL Ratio: 3.7 RATIO
Triglycerides: 86 mg/dL (ref <150)
VLDL: 17 mg/dL (ref 0–40)

## 2016-04-18 LAB — TSH: TSH: 1.548 u[IU]/mL (ref 0.350–4.500)

## 2016-04-18 LAB — HEPARIN LEVEL (UNFRACTIONATED)
HEPARIN UNFRACTIONATED: 0.72 [IU]/mL — AB (ref 0.30–0.70)
HEPARIN UNFRACTIONATED: 0.22 [IU]/mL — AB (ref 0.30–0.70)
Heparin Unfractionated: 0.73 IU/mL — ABNORMAL HIGH (ref 0.30–0.70)

## 2016-04-18 LAB — MRSA PCR SCREENING: MRSA by PCR: NEGATIVE

## 2016-04-18 MED ORDER — DILTIAZEM HCL 100 MG IV SOLR
5.0000 mg/h | INTRAVENOUS | Status: DC
  Filled 2016-04-18: qty 100

## 2016-04-18 MED ORDER — DILTIAZEM HCL 60 MG PO TABS
60.0000 mg | ORAL_TABLET | Freq: Four times a day (QID) | ORAL | Status: DC
  Administered 2016-04-18 – 2016-04-19 (×4): 60 mg via ORAL
  Filled 2016-04-18 (×4): qty 1

## 2016-04-18 MED ORDER — ATORVASTATIN CALCIUM 80 MG PO TABS
80.0000 mg | ORAL_TABLET | Freq: Every day | ORAL | Status: DC
  Administered 2016-04-18 – 2016-04-23 (×6): 80 mg via ORAL
  Filled 2016-04-18 (×6): qty 1

## 2016-04-18 MED ORDER — INSULIN STARTER KIT- SYRINGES (ENGLISH)
1.0000 | Freq: Once | Status: DC
  Filled 2016-04-18: qty 1

## 2016-04-18 MED ORDER — CARVEDILOL 3.125 MG PO TABS
3.1250 mg | ORAL_TABLET | Freq: Two times a day (BID) | ORAL | Status: DC
  Administered 2016-04-18: 3.125 mg via ORAL
  Filled 2016-04-18: qty 1

## 2016-04-18 MED ORDER — POTASSIUM CHLORIDE CRYS ER 20 MEQ PO TBCR
40.0000 meq | EXTENDED_RELEASE_TABLET | Freq: Once | ORAL | Status: AC
  Administered 2016-04-18: 40 meq via ORAL
  Filled 2016-04-18: qty 2

## 2016-04-18 MED ORDER — HYDRALAZINE HCL 20 MG/ML IJ SOLN: 10.0000 mg | Freq: Four times a day (QID) | INTRAMUSCULAR | Status: DC | PRN

## 2016-04-18 MED ORDER — HEPARIN (PORCINE) IN NACL 100-0.45 UNIT/ML-% IJ SOLN
1700.0000 [IU]/h | INTRAMUSCULAR | Status: DC
  Administered 2016-04-18: 1650 [IU]/h via INTRAVENOUS
  Administered 2016-04-19 – 2016-04-20 (×3): 1700 [IU]/h via INTRAVENOUS
  Filled 2016-04-18 (×4): qty 250

## 2016-04-18 MED ORDER — POTASSIUM CHLORIDE CRYS ER 20 MEQ PO TBCR: 40.0000 meq | EXTENDED_RELEASE_TABLET | Freq: Four times a day (QID) | ORAL | Status: DC

## 2016-04-18 MED ORDER — INSULIN GLARGINE 100 UNIT/ML ~~LOC~~ SOLN
12.0000 [IU] | Freq: Every day | SUBCUTANEOUS | Status: DC
  Administered 2016-04-18 – 2016-04-20 (×3): 12 [IU] via SUBCUTANEOUS
  Filled 2016-04-18 (×3): qty 0.12

## 2016-04-18 MED ORDER — HEPARIN BOLUS VIA INFUSION
2000.0000 [IU] | Freq: Once | INTRAVENOUS | Status: DC
  Filled 2016-04-18: qty 2000

## 2016-04-18 MED ORDER — INSULIN STARTER KIT- PEN NEEDLES (ENGLISH)
1.0000 | Freq: Once | Status: DC
  Filled 2016-04-18: qty 1

## 2016-04-18 MED ORDER — LIVING WELL WITH DIABETES BOOK
Freq: Once | Status: AC
  Administered 2016-04-18: 13:00:00
  Filled 2016-04-18: qty 1

## 2016-04-18 MED ORDER — CARVEDILOL 12.5 MG PO TABS: 12.5000 mg | ORAL_TABLET | Freq: Two times a day (BID) | ORAL | Status: DC

## 2016-04-18 NOTE — Consult Note (Signed)
Cardiology Consult    Patient ID: Darius Darius [name] MRN: [id number], DOB/AGE: 1955-02-12   Admit date: 04/17/2016 Date of Consult: 04/18/2016  Primary Physician: Darius D[name]., DO Primary Cardiologist: Dr. Aundra [name] Requesting Provider: Dr. Candiss D[name]  Reason for Consult: Atrial fibrillation and CHF  Patient Profile    Mr. Darius Darius [name] is a 61 yo male with a PMH significant for systolic heart failure, nonischemic cardiomyopathy, paroxysmal atrial fibrillation s/p failed TEE/DCCV on 04/03/11, but successful DCCV on 06/15/14, OSA not compliant on CPAP, and DM.  He has not been seen by our clinic since his last DCCV (06/15/14). He was anticoagulated with eliquis at that time. He presents to ED with afib RVR and acute on chronic systolic heart failure. Cardiology was consulted for Afib and CHF exacerbation.  Past Medical History   Past Medical History:  Diagnosis Date  . Atrial fibrillation (Darius Darius [name])    unable to tolerate versed and fentanyl sedation and required gen anesthesia for TEE;  s/p TEE-DCCV (failed);  Amiodarone started  . Chronic diastolic heart failure (Darius D[name])    a. Echo 03/27/11: EF 50-55%, inferior hypokinesis, moderate LAE, mild RVE, normal pulmonary pressures. ;   b.  TEE (04/03/11): EF 40-45%, mild MR moderate LAE, no LAA clot, mild RAE;  c.  Echocardiogram (02/2013): EF 60-65%, Gr 2 DD, mildly dilated Ao root (Ao root dimension 39 mm), MAC, mild LAE, normal RVF  . Diabetes mellitus, type 2 (Darius Darius [name])   . Hx of cardiovascular stress test    Lexiscan Myoview (02/2013): No ischemia or scar, EF 51%, low risk  . Morbid obesity (Darius Darius [name])   . Sleep apnea    a. sleep test 01/2013:  very severy OSA    Past Surgical History:  Procedure Laterality Date  . CARDIOVERSION  04/01/2011   Procedure: CARDIOVERSION;  Surgeon: Darius Perla, MD;  Location: [facility] ENDOSCOPY;  Service: Cardiovascular;  Laterality: N/A;  . CARDIOVERSION  04/03/2011   Procedure: CARDIOVERSION;  Surgeon: Darius Perla, MD;  Location: [name];   Service: Cardiovascular;  Laterality: N/A;  . CARDIOVERSION  04/03/2011   Procedure: CARDIOVERSION;  Surgeon: Darius Perla, MD;  Location: [place];  Service: Cardiovascular;  Laterality: N/A;  . CARDIOVERSION N/A 06/15/2014   Procedure: CARDIOVERSION;  Surgeon: Darius Dresser, MD;  Location: [name];  Service: Cardiovascular;  Laterality: N/A;  . I&D EXTREMITY Right 09/19/2015   Procedure: IRRIGATION AND DEBRIDEMENT EXTREMITY;  Surgeon: Darius Rossetti, MD;  Location: [place];  Service: Orthopedics;  Laterality: Right;  . TEE WITHOUT CARDIOVERSION  04/01/2011   Procedure: TRANSESOPHAGEAL ECHOCARDIOGRAM (TEE);  Surgeon: Darius Perla, MD;  Location: [name];  Service: Cardiovascular;  Laterality: N/A;  . TEE WITHOUT CARDIOVERSION  04/03/2011   Procedure: TRANSESOPHAGEAL ECHOCARDIOGRAM (TEE);  Surgeon: Darius Perla, MD;  Location: [facility] ENDOSCOPY;  Service: Cardiovascular;  Laterality: N/A;  . TEE WITHOUT CARDIOVERSION  04/03/2011   Procedure: TRANSESOPHAGEAL ECHOCARDIOGRAM (TEE);  Surgeon: Darius Perla, MD;  Location: [facility] OR;  Service: Cardiovascular;  Laterality: N/A;     Allergies  No Known Allergies  History of Present Illness    Mr. Darius Darius [name] is a 61 yo male with a PMH significant for systolic heart failure, nonischemic cardiomyopathy, paroxysmal atrial fibrillation s/p failed TEE/DCCV on 04/03/11, but successful DCCV on 06/15/14. He has not been seen by our clinic since his last DCCV (06/15/14). He was anticoagulated with eliquis at that time. He presents to ED with worsening shortness of breath on exertion and palpitations. He was found  to be in Afib RVR and acute combine CHF exacerbation with LE edema. He has not been compliant on his cardiac medications.   In the Ed, he was started on cardizem bolus plus gtt.  On my interview, he states he started noticing swelling in his legs last weekend (04/13/16) with associated shortness of breath. He denies chest pain, palpitations,  lightheadedness, and dizziness. He has only been taking ASA and metformin at home and states he does not have any insurance and can't afford medications. Review of previous progress notes from our office revealed past home medications of amiodarone, eliquis, cartia, lasix, potassium, and toprol.     Inpatient Medications    . aspirin EC  81 mg Oral Daily  . atorvastatin  80 mg Oral q1800  . carvedilol  3.125 mg Oral BID WC  . furosemide  40 mg Intravenous BID  . heparin  2,000 Units Intravenous Once  . insulin aspart  0-9 Units Subcutaneous TID WC  . insulin glargine  12 Units Subcutaneous Daily  . lisinopril  2.5 mg Oral Daily  . living well with diabetes book   Does not apply Once  . potassium chloride  20 mEq Oral Daily  . potassium chloride  40 mEq Oral Once     Outpatient Medications    Prior to Admission medications   Medication Sig Start Date End Date Taking? Authorizing Provider  aspirin EC 325 MG tablet Take 1 tablet (325 mg total) by mouth daily. 10/04/15  Yes Argentina Donovan, PA-C  metFORMIN (GLUCOPHAGE) 1000 MG tablet take 1 tablet by mouth twice a day with food 10/04/15  Yes Argentina Donovan, PA-C     Family History     Family History  Problem Relation Age of Onset  . Diabetes Father     died in his 45s  . Hypertension Father     died in her 62s  . Seizures Mother   . Hypertension Sister   . Hypertension Paternal Grandfather   . Heart attack Neg Hx   . Stroke Neg Hx     Social History    Social History   Social History  . Marital status: Single    Spouse name: N/A  . Number of children: N/A  . Years of education: N/A   Occupational History  . unemployeed    Social History Main Topics  . Smoking status: Never Smoker  . Smokeless tobacco: Never Used  . Alcohol use Yes     Comment: some  . Drug use: No  . Sexual activity: No   Other Topics Concern  . Not on file   Social History Narrative   Admits that he eats poorly and eats a lot at  work.      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     Review of Systems    General:  No chills, fever, night sweats or weight changes.  Cardiovascular:  No chest pain, orthopnea, palpitations, paroxysmal nocturnal dyspnea. Positive for dyspnea on exertion, edema, Dermatological: No rash, lesions/masses Respiratory: No cough, Positive for dyspnea Urologic: No hematuria, dysuria Abdominal:   No nausea, vomiting, diarrhea, bright red blood per rectum, melena, or hematemesis Neurologic:  No visual changes, changes in mental status. All other systems reviewed and are otherwise negative except as noted above.  Physical Exam    Blood pressure (!) 121/91, pulse (!) 48, temperature 97.8 F (36.6 C), temperature source Oral, resp. rate (!) 22, height _0  (1.88 m), weight  297 lb 9.9 oz (135 kg), SpO2 97 %.  General: Pleasant, NAD Psych: Normal affect. Neuro: Alert and oriented X 3. Moves all extremities spontaneously. HEENT: Normal  Neck: Supple without bruits. 9cm JVD. Lungs:  Resp regular and unlabored, diminished throughout bilaterally with occasional crackles in bases Heart: RRR no s3, s4, or murmurs. Abdomen: Soft, non-tender, non-distended, BS + x 4.  Extremities: No clubbing, cyanosis. 2+ B LE edema with venous stasis changes on feet. DP/PT/Radials 1+ and equal bilaterally.  Labs    Troponin [facility] of Care Test)  Recent Labs  04/17/16 1725  TROPIPOC 1.73*    Recent Labs  04/17/16 2329 04/18/16 0508  TROPONINI 1.85* 2.10*   Lab Results  Component Value Date   WBC 8.4 04/18/2016   HGB 15.9 04/18/2016   HCT 47.6 04/18/2016   MCV 85.0 04/18/2016   PLT 245 04/18/2016    Recent Labs Lab 04/18/16 0508  NA 136  K 3.4*  CL 101  CO2 28  BUN 15  CREATININE 1.07  CALCIUM 8.3*  PROT 6.1*  BILITOT 1.0  ALKPHOS 68  ALT 36  AST 46*  GLUCOSE 90   Lab Results  Component Value Date   CHOL 191 06/05/2014   HDL 50 06/05/2014   LDLCALC 121 (H)  06/05/2014   TRIG 102 06/05/2014   Lab Results  Component Value Date   DDIMER 0.89 (H) 04/02/2014     Radiology Studies    Dg Chest 2 View  Result Date: 04/17/2016 CLINICAL DATA:  Shortness of breath with exertion. History of atrial fibrillation. EXAM: CHEST  2 VIEW COMPARISON:  PA and lateral chest 04/02/2014 and CT chest 04/03/2014. FINDINGS: There is cardiomegaly and mild interstitial edema. Small left effusion is identified. No consolidative process or pneumothorax. IMPRESSION: Cardiomegaly mild interstitial edema with a small left pleural effusion. Electronically Signed   By: Inge Rise M.D.   On: 04/17/2016 15:20    ECG & Cardiac Imaging     Echocardiogram 04/18/16: - pending   Echocardiogram 04/04/14: Study Conclusions - Left ventricle: The cavity size was moderately dilated. Wall thickness was normal. Systolic function was mildly reduced. The estimated ejection fraction was in the range of 45% to 50%. - Left atrium: The atrium was severely dilated. - Right atrium: The atrium was severely dilated.   Myoview 02/16/13: Impression Exercise Capacity:  Lexiscan with no exercise. BP Response:  Normal blood pressure response. Clinical Symptoms:  Warm and Lightheaded ECG Impression:  No significant ST segment change suggestive of ischemia. Comparison with Prior Nuclear Study: No images to compare  Overall Impression:  Low risk stress nuclear study No ischemia or infarct  Appears to be LVE with diffuse hypokinesis  Suggest MRI or echo correlation of EF. LV Ejection Fraction: 51%.  LV Wall Motion:  Mildly decreased diffusely   Assessment & Plan    1. Atrial fibrillation with RVR - Diltiazem started in the ED, will plan to stop this given his acute on chronic systolic heart failure  - coreg has been started by primary team - I transitioned diltiazem drip to oral preparation; we can titrate as needed for rate control. - anticoagulation with heparin gtt - he has not  been compliant on any heart medications, including amiodarone, cartia, toprol, and eliquis - echocardiogram pending - results will help guide medication decisions.  - he will need to discharge with an anticoagulation plan. Given his lack of insurance, NOAC may not be an option. However, given his noncompliance, coumadin may be  risky. Will consult case management for NOAC options.   2. Acute on chronic systolic heart failure - pt states he started having lower extremity edema and shortness of breath approximately 5 days ago, these are new symptoms for him and he attributes them to the weather change. He denies orthopnea - diuresing with IV lasix: 1.8L urine output yesterday, is net negative 2.7L so far today; weight on admission was 310 lbs, and he is 297 lbs today - continue with aggressive diuresis as he appears volume up on exam.  - CXR with pulmonary edema and pleural effusions - BNP on admission: 659.8 - sCr 1.07 - lisinopril 2.5 mg started by primary team   3. Elevated troponin - troponin peaked at 2.10 (04/17/16) and is now trending down (1.67 on 04/18/16). - EKG without ischemic changes - elevated troponin may be caused by acute on chronic CHF. The patient denies anginal symptoms with exertion and at rest. Low suspicion for acute obstructive CAD  process. However, further ischemic evaluation may be warranted if echocardiogram is grossly changed from previous echo in 2016. Despite denying chest anginal symptoms, he has multiple risk factors for CAD: HTN, HLD, DM, and obesity - lipid panel pending - will likely need high potency statin therapy   5. DM type II - last A1c was > 15.5 on 09/20/15 - pt states he has been compliant on metformin - repeat A1c pending   4. HLD - previous lipid panel with hyperlipidemia - repeat panel this admission - likely start on statin    5. OSA not compliant on CPAP - this is likely contributing to his acute on chronic systolic heart failure -  encouraged compliance   6. Obesity - encouraged movement and weight loss - increases risk for CAD   Signed, Ledora Bottcher, PA-C 04/18/2016, 1:19 PM   Personally seen and examined. Agree with above.  61 year old male with noncompliance, persistent atrial fibrillation, uncontrolled diabetes with vascular complications, right foot rash, acute on chronic diastolic/systolic heart failure, morbid obesity, obstructive sleep apnea, demand ischemia.  His right foot has redness, rash-like quality. Irregularly irregular rhythm, mildly tachycardic, lungs with crackles at bases, alert, loquacious   - Continue aggressive diuresis. Shortness of breath has improved.  - Watch potassium and creatinine.  - Continue with heparin for anticoagulation for now. If his echo demonstrates reduced ejection fraction, we may need to perform cardiac catheterization next week.  - If his ejection fraction is reduced, transitioned diltiazem over to Toprol or carvedilol.  - We will transitioned diltiazem drip 5 mg over to 60 by mouth every 6  - It is doubtful that he will maintain rhythm control. I would not resume amiodarone unless we actually feel as though he will require rhythm control and cardioversion.  - Encourage weight loss.  - Anticoagulation will be an issue chronically, he has no insurance. He may need to go on Coumadin?  - May be helpful to have diabetes educator come talk with him and have wound consult see his foot. Perhaps vascular study such as arterial ultrasound would be helpful. He may have progressive peripheral vascular disease. His father had an amputation.  We will follow along.

## 2016-04-18 NOTE — Progress Notes (Signed)
PROGRESS NOTE                                                                                                                                                                                                             Patient Demographics:    Darius [name], is a 61 y.o. male, DOB - August 17, 1955, XLK:[id number]  Admit date - 04/17/2016   Admitting Physician Rise Patience, MD  Outpatient Primary MD for the patient is [name]., DO  LOS - 1  Chief Complaint  Patient presents with  . Atrial Fibrillation       Brief Narrative   Darius [name] is a 61 y.o. male with history of chronic combined systolic and diastolic CHF, atrial fibrillation, diabetes mellitus who has not been taking his cardiac medications for last 2 years presents to the ER because of worsening shortness of breath on exertion with palpitations.   Subjective:    Darius [name] today has, No headache, No chest pain, No abdominal pain - No Nausea, No new weakness tingling or numbness, No Cough - SOB.     Assessment  & Plan :     1.Chronic A. fib with RVR. Currently on Cardizem drip, will start him on oral beta blocker and try to place him on beta blockers preferably, echocardiogram pending, TSH stable, he is currently on heparin drip. He has long-standing history of A. fib and noncompliance with medications. Counseled. Cardiology also consulted.  2. Acute on chronic combined systolic diastolic CHF last EF 36%. Continue Lasix, rate control as above, cardiology to follow, repeat echocardiogram. Salt and fluid restriction. Monitor intake and output and daily weights.  3. Hypertension. For now Coreg and ACE inhibitor along with Cardizem. Monitor blood pressure last reading was not accurate.  4. Dyslipidemia. On statin continue.   5. Mild elevation in troponin. Trend is flat and non-ACS pattern, due to demand ischemia from RVR, chest pain-free, continue  heparin drip and beta blocker, cardiology to evaluate, check echocardiogram to evaluate EF and wall motion.   6. DM type II. No medications and poor compliance, A1c greater than 15, placed on Lantus and sliding scale, diabetic education.  Lab Results  Component Value Date   HGBA1C >15.5 (H) 09/20/2015   CBG (last 3)   Recent Labs  04/18/16 0820  GLUCAP 89  Diet : Diet Carb Modified Fluid consistency: Thin; Room service appropriate? Yes; Fluid restriction: 1500 mL Fluid    Family Communication  :  None  Code Status :  Full  Disposition Plan  :  Home 2-3 days  Consults  :  Cards  Procedures  :    TTE  DVT Prophylaxis  :   Heparin   Lab Results  Component Value Date   PLT 245 04/18/2016    Inpatient Medications  Scheduled Meds: . aspirin EC  81 mg Oral Daily  . atorvastatin  80 mg Oral q1800  . carvedilol  3.125 mg Oral BID WC  . furosemide  40 mg Intravenous BID  . heparin  2,000 Units Intravenous Once  . insulin aspart  0-9 Units Subcutaneous TID WC  . insulin glargine  12 Units Subcutaneous Daily  . lisinopril  2.5 mg Oral Daily  . potassium chloride  20 mEq Oral Daily  . potassium chloride  40 mEq Oral Once   Continuous Infusions: . diltiazem (CARDIZEM) infusion 5 mg/hr (04/18/16 0503)  . heparin 1,850 Units/hr (04/18/16 1000)   PRN Meds:.hydrALAZINE, ondansetron **OR** ondansetron (ZOFRAN) IV  Antibiotics  :    Anti-infectives    None         Objective:   Vitals:   04/18/16 0715 04/18/16 0800 04/18/16 0801 04/18/16 0900  BP: (!) 134/116 (!) 121/94 (!) 121/94 (!) 211/175  Pulse: 60 (!) 31 (!) 57 (!) 53  Resp: (!) 24 19 (!) 23 (!) 27  Temp:   97.7 F (36.5 C)   TempSrc:   Oral   SpO2: 99% 93% 93% 96%  Weight:      Height:        Wt Readings from Last 3 Encounters:  04/18/16 135 kg (297 lb 9.9 oz)  10/04/15 102.2 kg (225 lb 3.2 oz)  09/21/15 100.2 kg (221 lb)     Intake/Output Summary (Last 24 hours) at 04/18/16 1214 Last  data filed at 04/18/16 1154  Gross per 24 hour  Intake           492.84 ml  Output             3275 ml  Net         -2782.16 ml     Physical Exam  Awake Alert, Oriented X 3, No new F.N deficits, Normal affect [place].AT,PERRAL Supple Neck,No JVD, No cervical lymphadenopathy appriciated.  Symmetrical Chest wall movement, Good air movement bilaterally, CTAB iRRR,No Gallops,Rubs or new Murmurs, No Parasternal Heave +ve B.Sounds, Abd Soft, No tenderness, No organomegaly appriciated, No rebound - guarding or rigidity. No Cyanosis, Clubbing or edema, No new Rash or bruise       Data Review:    CBC  Recent Labs Lab 04/17/16 1713 04/18/16 0508  WBC 7.6 8.4  HGB 15.2 15.9  HCT 45.4 47.6  PLT 234 245  MCV 84.4 85.0  MCH 28.3 28.4  MCHC 33.5 33.4  RDW 15.3 15.3  LYMPHSABS  --  3.1  MONOABS  --  0.9  EOSABS  --  0.2  BASOSABS  --  0.0    Chemistries   Recent Labs Lab 04/17/16 1713 04/17/16 2329 04/18/16 0508  NA 137  --  136  K 3.9  --  3.4*  CL 100*  --  101  CO2 26  --  28  GLUCOSE 72  --  90  BUN 18  --  15  CREATININE 1.15  --  1.07  CALCIUM 8.8*  --  8.3*  MG  --  1.7  --   AST  --   --  46*  ALT  --   --  36  ALKPHOS  --   --  68  BILITOT  --   --  1.0   ------------------------------------------------------------------------------------------------------------------ No results for input(s): CHOL, HDL, LDLCALC, TRIG, CHOLHDL, LDLDIRECT in the last 72 hours.  Lab Results  Component Value Date   HGBA1C >15.5 (H) 09/20/2015   ------------------------------------------------------------------------------------------------------------------  Recent Labs  04/17/16 2329  TSH 1.548   ------------------------------------------------------------------------------------------------------------------ No results for input(s): VITAMINB12, FOLATE, FERRITIN, TIBC, IRON, RETICCTPCT in the last 72 hours.  Coagulation profile  Recent Labs Lab 04/17/16 1713  INR  1.22    No results for input(s): DDIMER in the last 72 hours.  Cardiac Enzymes  Recent Labs Lab 04/17/16 2329 04/18/16 0508  TROPONINI 1.85* 2.10*   ------------------------------------------------------------------------------------------------------------------    Component Value Date/Time   BNP 659.8 (H) 04/17/2016 1713    Micro Results Recent Results (from the past 240 hour(s))  MRSA PCR Screening     Status: None   Collection Time: 04/17/16  9:59 PM  Result Value Ref Range Status   MRSA by PCR NEGATIVE NEGATIVE Final    Comment:        The GeneXpert MRSA Assay (FDA approved for NASAL specimens only), is one component of a comprehensive MRSA colonization surveillance program. It is not intended to diagnose MRSA infection nor to guide or monitor treatment for MRSA infections.     Radiology Reports Dg Chest 2 View  Result Date: 04/17/2016 CLINICAL DATA:  Shortness of breath with exertion. History of atrial fibrillation. EXAM: CHEST  2 VIEW COMPARISON:  PA and lateral chest 04/02/2014 and CT chest 04/03/2014. FINDINGS: There is cardiomegaly and mild interstitial edema. Small left effusion is identified. No consolidative process or pneumothorax. IMPRESSION: Cardiomegaly mild interstitial edema with a small left pleural effusion. Electronically Signed   By: Inge Rise M.D.   On: 04/17/2016 15:20    Time Spent in minutes  30   Mayline Dragon K M.D on 04/18/2016 at 12:14 PM  Between 7am to 7pm - Pager - [phone]  After 7pm go to [email or web address] - password [facility]  [facility] -  Office  [phone]

## 2016-04-18 NOTE — Progress Notes (Signed)
NP on call aware of pt troponin 1.85

## 2016-04-18 NOTE — Progress Notes (Signed)
Inpatient Diabetes Program Recommendations  AACE/ADA: New Consensus Statement on Inpatient Glycemic Control (2015)  Target Ranges:  Prepandial:   less than 140 mg/dL      Peak postprandial:   less than 180 mg/dL (1-2 hours)      Critically ill patients:  140 - 180 mg/dL   Lab Results  Component Value Date   GLUCAP 125 (H) 04/18/2016   HGBA1C >15.5 (H) 09/20/2015    Review of Glycemic Control Results for PRIMITIVO, MERKEY (MRN [id number]) as of 04/18/2016 13:34  Ref. Range 04/18/2016 08:20 04/18/2016 12:14  Glucose-Capillary Latest Ref Range: 65 - 99 mg/dL 89 125 (H)   Diabetes history: DM2 Outpatient Diabetes medications: Metformin 1 gm bid Current orders for Inpatient glycemic control: Lantus 12 units + Novolog correction 0-9 units tid  Inpatient Diabetes Program Recommendations:  Spoke with patient @ bedside. Patient states he no longer has insurance and has limited income since started on social security. Consult to case manager regarding.Text page to Dr. Candiss Norse for order for new A1c since last A1c > 15.5 last admission. Patient has not had a PCP appt. since last admission due to no insurance. Spoke with pt about A1C from last August and explained what an A1C is, basic pathophysiology of DM Type 2, basic home care, basic diabetes diet nutrition principles, importance of checking CBGs and maintaining good CBG control to prevent long-term and short-term complications. Reviewed signs and symptoms of hyperglycemia and hypoglycemia and how to treat hypoglycemia at home. Also reviewed blood sugar goals at home.  RNs to provide ongoing basic DM education at bedside with this patient. Have ordered educational booklet, insulin starter kit, and DM videos. Spoke with RN Enid Derry and requested RN: Please begin insulin teaching with this patient and review use of insulin pen. Please allow pt to practice drawing up and giving injections as much as possible and document.  Patient may need to be on 70/30 insulin  instead of Lantus due to cost.   Thank you, Nani Gasser. Dora Clauss, RN, MSN, CDE Inpatient Glycemic Control Team Team Pager [phone] (8am-5pm) 04/18/2016 1:55 PM

## 2016-04-18 NOTE — Care Management Note (Signed)
Case Management Note  Patient Details  Name: Darius [name] MRN: [id number] Date of Birth: 08/23/1955  Subjective/Objective:       Pt admitted with Afib and SOB             Action/Plan:   PTA independent from home alone.  Pt states he weighs daily and adheres to low salt diet.  CM will continue to follow for discharge needs   Expected Discharge Date:                  Expected Discharge Plan:  Home/Self Care  In-House Referral:     Discharge planning Services  CM Consult  Post Acute Care Choice:    Choice offered to:     DME Arranged:    DME Agency:     HH Arranged:    HH Agency:     Status of Service:  In process, will continue to follow  If discussed at Long Length of Stay Meetings, dates discussed:    Additional Comments:  Maryclare Labrador, RN 04/18/2016, 10:40 AM

## 2016-04-18 NOTE — Progress Notes (Addendum)
ANTICOAGULATION CONSULT NOTE   Pharmacy Consult for Heparin Indication: atrial fibrillation  No Known Allergies  Patient Measurements: Height: _0  (188 cm) Weight: 297 lb 9.9 oz (135 kg) IBW/kg (Calculated) : 82.2 Heparin Dosing Weight: 114kg  Vital Signs: Temp: 97 F (36.1 C) (03/09 1749) Temp Source: Oral (03/09 1749) BP: 125/102 (03/09 1749) Pulse Rate: 93 (03/09 1749)  Labs:  Recent Labs  04/17/16 1713 04/17/16 2329 04/18/16 0026 04/18/16 0508 04/18/16 0850 04/18/16 1135 04/18/16 1710  HGB 15.2  --   --  15.9  --   --   --   HCT 45.4  --   --  47.6  --   --   --   PLT 234  --   --  245  --   --   --   LABPROT 15.5*  --   --   --   --   --   --   INR 1.22  --   --   --   --   --   --   HEPARINUNFRC  --   --  0.22*  --  0.73*  --  0.72*  CREATININE 1.15  --   --  1.07  --   --   --   TROPONINI  --  1.85*  --  2.10*  --  1.67*  --     Estimated Creatinine Clearance: 104.6 mL/min (by C-G formula based on SCr of 1.07 mg/dL).   Medical History: Past Medical History:  Diagnosis Date  . Atrial fibrillation ([place])    unable to tolerate versed and fentanyl sedation and required gen anesthesia for TEE;  s/p TEE-DCCV (failed);  Amiodarone started  . Chronic diastolic heart failure ([place])    a. Echo 03/27/11: EF 50-55%, inferior hypokinesis, moderate LAE, mild RVE, normal pulmonary pressures. ;   b.  TEE (04/03/11): EF 40-45%, mild MR moderate LAE, no LAA clot, mild RAE;  c.  Echocardiogram (02/2013): EF 60-65%, Gr 2 DD, mildly dilated Ao root (Ao root dimension 39 mm), MAC, mild LAE, normal RVF  . Diabetes mellitus, type 2 ([place])   . Hx of cardiovascular stress test    Lexiscan Myoview (02/2013): No ischemia or scar, EF 51%, low risk  . Morbid obesity ([place])   . Sleep apnea    a. sleep test 01/2013:  very severy OSA    Medications:  Prescriptions Prior to Admission  Medication Sig Dispense Refill Last Dose  . aspirin EC 325 MG tablet Take 1 tablet (325 mg total) by  mouth daily. 90 tablet 3 04/17/2016 at Unknown time  . metFORMIN (GLUCOPHAGE) 1000 MG tablet take 1 tablet by mouth twice a day with food 60 tablet 3 04/17/2016 at Unknown time    Infusions:  . diltiazem (CARDIZEM) infusion Stopped (04/18/16 1545)  . heparin 1,850 Units/hr (04/18/16 1000)   Assessment: 65 yom who came to ED 3/8 from PCP's office after being told he was in A Fib. He was not on any anticoagulation prior to this. CHADS VASc at least 2 (CHF, DM).  Baseline CBC is within normal limits and INR 1.22.   Heparin level : 0.72 remains slightly above therapeutic range after rate decrease - no bleeding reported  Goal of Therapy:  Heparin level 0.3-0.7 units/ml Monitor platelets by anticoagulation protocol: Yes   Plan:  -Decrease IV heparin gtt rate to 1650 units/hr. -Recheck heparin level with morning labs. -Daily heparin level and CBC -F/U plans for oral anticoagulation   Nicole Kindred  Rielyn Krupinski, PharmD Clinical Pharmacist Pager: [phone] 04/18/2016 6:09 PM

## 2016-04-18 NOTE — Progress Notes (Signed)
Nutrition Brief Note  Patient identified on the Malnutrition Screening Tool (MST) Report  Wt Readings from Last 15 Encounters:  04/18/16 297 lb 9.9 oz (135 kg)  10/04/15 225 lb 3.2 oz (102.2 kg)  09/21/15 221 lb (100.2 kg)  06/05/14 (!) 337 lb (152.9 kg)  05/15/14 (!) 332 lb (150.6 kg)  04/19/14 (!) 328 lb (148.8 kg)  04/09/14 (!) 326 lb 8 oz (148.1 kg)  04/02/14 (!) 355 lb (161 kg)  11/16/13 (!) 346 lb 3.2 oz (157 kg)  07/22/13 (!) 344 lb 8 oz (156.3 kg)  05/17/13 (!) 342 lb 12.8 oz (155.5 kg)  04/13/13 (!) 342 lb 3.2 oz (155.2 kg)  03/22/13 (!) 337 lb (152.9 kg)  03/14/13 (!) 341 lb (154.7 kg)  02/16/13 (!) 345 lb (156.5 kg)   Darius [name] is a 61 y.o. male with history of chronic combined systolic and diastolic CHF, atrial fibrillation, diabetes mellitus who has not been taking his cardiac medications for last 2 years presents to the ER because of worsening shortness of breath on exertion with palpitations. Patient has been having these symptoms for last 2 weeks. Denies any chest pain. Denies any fever chills or productive cough. Patient also noted increasing swelling of the lower extremity.   Case discussed with RN, who reports pt with good appetite. Reviewed wt hx, which reveals wt gain over the past year, likely related to fluid changes from CHF.   Body mass index is 38.21 kg/m. Patient meets criteria for obesity, class II based on current BMI.   Current diet order is Carb Modified, patient is consuming approximately 100% of meals at this time. Labs and medications reviewed.   No nutrition interventions warranted at this time. If nutrition issues arise, please consult RD.   Kaladin Noseworthy A. Jimmye Norman, RD, LDN, CDE Pager: [phone] After hours Pager: [phone]

## 2016-04-18 NOTE — Progress Notes (Signed)
ANTICOAGULATION CONSULT NOTE - Follow Up Consult  Pharmacy Consult for heparin Indication: atrial fibrillation  Labs:  Recent Labs  04/17/16 1713 04/17/16 2329 04/18/16 0026  HGB 15.2  --   --   HCT 45.4  --   --   PLT 234  --   --   LABPROT 15.5*  --   --   INR 1.22  --   --   HEPARINUNFRC  --   --  0.22*  CREATININE 1.15  --   --   TROPONINI  --  1.85*  --     Assessment: 60yo male subtherapeutic on heparin with initial dosing for Afib.  Goal of Therapy:  Heparin level 0.3-0.7 units/ml   Plan:  Will give small bolus of 2000 units and increase gtt by 2-3 units/kg/hr to 2000 units/hr and check level in [place], PharmD, BCPS  04/18/2016,1:33 AM

## 2016-04-18 NOTE — Progress Notes (Signed)
ANTICOAGULATION CONSULT NOTE   Pharmacy Consult for Heparin Indication: atrial fibrillation  No Known Allergies  Patient Measurements: Height: _0  (188 cm) Weight: 297 lb 9.9 oz (135 kg) IBW/kg (Calculated) : 82.2 Heparin Dosing Weight: 114kg  Vital Signs: Temp: 97.7 F (36.5 C) (03/09 0801) Temp Source: Oral (03/09 0801) BP: 211/175 (03/09 0900) Pulse Rate: 53 (03/09 0900)  Labs:  Recent Labs  04/17/16 1713 04/17/16 2329 04/18/16 0026 04/18/16 0508 04/18/16 0850  HGB 15.2  --   --  15.9  --   HCT 45.4  --   --  47.6  --   PLT 234  --   --  245  --   LABPROT 15.5*  --   --   --   --   INR 1.22  --   --   --   --   HEPARINUNFRC  --   --  0.22*  --  0.73*  CREATININE 1.15  --   --  1.07  --   TROPONINI  --  1.85*  --  2.10*  --     Estimated Creatinine Clearance: 104.6 mL/min (by C-G formula based on SCr of 1.07 mg/dL).   Medical History: Past Medical History:  Diagnosis Date  . Atrial fibrillation ([place])    unable to tolerate versed and fentanyl sedation and required gen anesthesia for TEE;  s/p TEE-DCCV (failed);  Amiodarone started  . Chronic diastolic heart failure ([place])    a. Echo 03/27/11: EF 50-55%, inferior hypokinesis, moderate LAE, mild RVE, normal pulmonary pressures. ;   b.  TEE (04/03/11): EF 40-45%, mild MR moderate LAE, no LAA clot, mild RAE;  c.  Echocardiogram (02/2013): EF 60-65%, Gr 2 DD, mildly dilated Ao root (Ao root dimension 39 mm), MAC, mild LAE, normal RVF  . Diabetes mellitus, type 2 ([place])   . Hx of cardiovascular stress test    Lexiscan Myoview (02/2013): No ischemia or scar, EF 51%, low risk  . Morbid obesity ([place])   . Sleep apnea    a. sleep test 01/2013:  very severy OSA    Medications:  Prescriptions Prior to Admission  Medication Sig Dispense Refill Last Dose  . aspirin EC 325 MG tablet Take 1 tablet (325 mg total) by mouth daily. 90 tablet 3 04/17/2016 at Unknown time  . metFORMIN (GLUCOPHAGE) 1000 MG tablet take 1 tablet by  mouth twice a day with food 60 tablet 3 04/17/2016 at Unknown time    Infusions:  . diltiazem (CARDIZEM) infusion 5 mg/hr (04/18/16 0503)  . heparin      Assessment: 89 yom who came to ED 3/8 from PCP's office after being told he was in A Fib. He was not on any anticoagulation prior to this. CHADS VASc at least 2 (CHF, DM).  Baseline CBC is within normal limits and INR 1.22.   Today's heparin level is slightly above goal after rate increased overnight.  No bleeding or complications noted, level drawn from arm opposite to where heparin is running.  Goal of Therapy:  Heparin level 0.3-0.7 units/ml Monitor platelets by anticoagulation protocol: Yes   Plan:  -Decrease IV heparin gtt rate to 1850 units/hr. -Recheck heparin level in 6 hrs. -Daily heparin level and CBC -F/U plans for oral anticoagulation   Uvaldo Rising, BCPS  Clinical Pharmacist Pager [phone]  04/18/2016 10:17 AM

## 2016-04-19 ENCOUNTER — Other Ambulatory Visit (HOSPITAL_COMMUNITY): Payer: BLUE CROSS/BLUE SHIELD

## 2016-04-19 ENCOUNTER — Inpatient Hospital Stay (HOSPITAL_COMMUNITY): Payer: Medicaid Other

## 2016-04-19 DIAGNOSIS — I428 Other cardiomyopathies: Secondary | ICD-10-CM

## 2016-04-19 DIAGNOSIS — I509 Heart failure, unspecified: Secondary | ICD-10-CM

## 2016-04-19 LAB — ECHOCARDIOGRAM COMPLETE
Height: 74 in
Weight: 4752 oz

## 2016-04-19 LAB — BASIC METABOLIC PANEL
Anion gap: 12 (ref 5–15)
BUN: 16 mg/dL (ref 6–20)
CALCIUM: 8.8 mg/dL — AB (ref 8.9–10.3)
CHLORIDE: 98 mmol/L — AB (ref 101–111)
CO2: 26 mmol/L (ref 22–32)
CREATININE: 1.13 mg/dL (ref 0.61–1.24)
GFR calc non Af Amer: >60 mL/min (ref >60)
Glucose, Bld: 92 mg/dL (ref 65–99)
Potassium: 4.1 mmol/L (ref 3.5–5.1)
SODIUM: 136 mmol/L (ref 135–145)

## 2016-04-19 LAB — CBC
HCT: 47.4 % (ref 39.0–52.0)
HEMOGLOBIN: 15.9 g/dL (ref 13.0–17.0)
MCH: 28.6 pg (ref 26.0–34.0)
MCHC: 33.5 g/dL (ref 30.0–36.0)
MCV: 85.4 fL (ref 78.0–100.0)
Platelets: 208 10*3/uL (ref 150–400)
RBC: 5.55 MIL/uL (ref 4.22–5.81)
RDW: 15.3 % (ref 11.5–15.5)
WBC: 8.2 10*3/uL (ref 4.0–10.5)

## 2016-04-19 LAB — HEMOGLOBIN A1C
HEMOGLOBIN A1C: 6.2 % — AB (ref 4.8–5.6)
MEAN PLASMA GLUCOSE: 131 mg/dL

## 2016-04-19 LAB — HEPARIN LEVEL (UNFRACTIONATED)
HEPARIN UNFRACTIONATED: 0.34 [IU]/mL (ref 0.30–0.70)
HEPARIN UNFRACTIONATED: 0.44 [IU]/mL (ref 0.30–0.70)

## 2016-04-19 LAB — GLUCOSE, CAPILLARY
GLUCOSE-CAPILLARY: 93 mg/dL (ref 65–99)
Glucose-Capillary: 94 mg/dL (ref 65–99)
Glucose-Capillary: 161 mg/dL — ABNORMAL HIGH (ref 65–99)
Glucose-Capillary: 92 mg/dL (ref 65–99)

## 2016-04-19 MED ORDER — METOPROLOL TARTRATE 5 MG/5ML IV SOLN: 5.0000 mg | INTRAVENOUS | Status: DC | PRN

## 2016-04-19 MED ORDER — INFLUENZA VAC SPLIT QUAD 0.5 ML IM SUSY: 0.5000 mL | PREFILLED_SYRINGE | INTRAMUSCULAR | Status: DC

## 2016-04-19 MED ORDER — NYSTATIN 100000 UNIT/GM EX POWD
Freq: Three times a day (TID) | CUTANEOUS | Status: DC
Start: 2016-04-19 — End: 2016-04-20
  Administered 2016-04-19: 10:00:00 via TOPICAL
  Administered 2016-04-19: 1 via TOPICAL
  Administered 2016-04-19: 16:00:00 via TOPICAL
  Filled 2016-04-19: qty 15

## 2016-04-19 MED ORDER — METOPROLOL TARTRATE 50 MG PO TABS
75.0000 mg | ORAL_TABLET | Freq: Two times a day (BID) | ORAL | Status: DC
  Administered 2016-04-19 (×2): 75 mg via ORAL
  Filled 2016-04-19 (×2): qty 1

## 2016-04-19 MED ORDER — POTASSIUM CHLORIDE CRYS ER 20 MEQ PO TBCR
20.0000 meq | EXTENDED_RELEASE_TABLET | Freq: Every day | ORAL | Status: DC
  Administered 2016-04-19 – 2016-04-24 (×5): 20 meq via ORAL
  Filled 2016-04-19 (×5): qty 1

## 2016-04-19 MED ORDER — MAGNESIUM SULFATE IN D5W 1-5 GM/100ML-% IV SOLN
1.0000 g | Freq: Once | INTRAVENOUS | Status: AC
  Administered 2016-04-19: 1 g via INTRAVENOUS
  Filled 2016-04-19: qty 100

## 2016-04-19 MED ORDER — CLINDAMYCIN HCL 300 MG PO CAPS
300.0000 mg | ORAL_CAPSULE | Freq: Four times a day (QID) | ORAL | Status: DC
  Administered 2016-04-19 – 2016-04-24 (×21): 300 mg via ORAL
  Filled 2016-04-19 (×23): qty 1

## 2016-04-19 NOTE — Progress Notes (Signed)
ANTICOAGULATION CONSULT NOTE   Pharmacy Consult for Heparin Indication: atrial fibrillation  No Known Allergies  Patient Measurements: Height: _0  (188 cm) Weight: 297 lb (134.7 kg) IBW/kg (Calculated) : 82.2 Heparin Dosing Weight: 114kg  Vital Signs: Temp: 97.6 F (36.4 C) (03/10 1246) Temp Source: Oral (03/10 1246) BP: 85/70 (03/10 1246) Pulse Rate: 92 (03/10 0400)  Labs:  Recent Labs  04/17/16 1713 04/17/16 2329  04/18/16 0508  04/18/16 1135 04/18/16 1710 04/19/16 0231 04/19/16 1124  HGB 15.2  --   --  15.9  --   --   --  15.9  --   HCT 45.4  --   --  47.6  --   --   --  47.4  --   PLT 234  --   --  245  --   --   --  208  --   LABPROT 15.5*  --   --   --   --   --   --   --   --   INR 1.22  --   --   --   --   --   --   --   --   HEPARINUNFRC  --   --   < >  --   < >  --  0.72* 0.34 0.44  CREATININE 1.15  --   --  1.07  --   --   --  1.13  --   TROPONINI  --  1.85*  --  2.10*  --  1.67*  --   --   --   < > = values in this interval not displayed.  Estimated Creatinine Clearance: 98.9 mL/min (by C-G formula based on SCr of 1.13 mg/dL).   Medical History: Past Medical History:  Diagnosis Date  . Atrial fibrillation ([place])    unable to tolerate versed and fentanyl sedation and required gen anesthesia for TEE;  s/p TEE-DCCV (failed);  Amiodarone started  . Chronic diastolic heart failure ([place])    a. Echo 03/27/11: EF 50-55%, inferior hypokinesis, moderate LAE, mild RVE, normal pulmonary pressures. ;   b.  TEE (04/03/11): EF 40-45%, mild MR moderate LAE, no LAA clot, mild RAE;  c.  Echocardiogram (02/2013): EF 60-65%, Gr 2 DD, mildly dilated Ao root (Ao root dimension 39 mm), MAC, mild LAE, normal RVF  . Diabetes mellitus, type 2 ([place])   . Hx of cardiovascular stress test    Lexiscan Myoview (02/2013): No ischemia or scar, EF 51%, low risk  . Morbid obesity ([place])   . Sleep apnea    a. sleep test 01/2013:  very severy OSA    Medications:  Prescriptions Prior to  Admission  Medication Sig Dispense Refill Last Dose  . aspirin EC 325 MG tablet Take 1 tablet (325 mg total) by mouth daily. 90 tablet 3 04/17/2016 at Unknown time  . metFORMIN (GLUCOPHAGE) 1000 MG tablet take 1 tablet by mouth twice a day with food 60 tablet 3 04/17/2016 at Unknown time    Infusions:  . heparin 1,700 Units/hr (04/19/16 1227)   Assessment: 15 yom who came to ED 3/8 from PCP's office after being told he was in A Fib. He was not on any anticoagulation prior to this. CHADS VASc 3(age, CHF, DM).  Baseline CBC is within normal limits and INR 1.22.  Heparin drip 1700 uts/hr HL 0.44 at goal.  No bleeding noted, CBC stable  Goal of Therapy:  Heparin level 0.3-0.7 units/ml Monitor platelets  by anticoagulation protocol: Yes   Plan:  -Continue  IV heparin gtt rate to 1700 units/hr. -Daily heparin level and CBC -F/U plans for oral anticoagulation   Bonnita Nasuti Pharm.D. CPP, BCPS Clinical Pharmacist [phone] 04/19/2016 1:56 PM

## 2016-04-19 NOTE — Progress Notes (Signed)
PROGRESS NOTE                                                                                                                                                                                                             Patient Demographics:    Darius [name], is a 61 y.o. male, DOB - 13-Nov-1955, JJK:[id number]  Admit date - 04/17/2016   Admitting Physician Rise Patience, MD  Outpatient Primary MD for the patient is [name]., DO  LOS - 2  Chief Complaint  Patient presents with  . Atrial Fibrillation       Brief Narrative   Darius [name] is a 61 y.o. male with history of chronic combined systolic and diastolic CHF, atrial fibrillation, diabetes mellitus who has not been taking his cardiac medications for last 2 years presents to the ER because of worsening shortness of breath on exertion with palpitations.   Subjective:    Darius [name] today has, No headache, No chest pain, No abdominal pain - No Nausea, No new weakness tingling or numbness, No Cough - SOB.     Assessment  & Plan :     1.Chronic A. fib with RVR. Cardizem drip has been stopped he is currently on Lopressor will try to maintain on that, echocardiogram pending, TSH stable, he is currently on heparin drip. He has long-standing history of A. fib and noncompliance with medications, Counseled. Cardiology also consulted.  2. Acute on chronic combined systolic diastolic CHF last EF 37%. Continue Lasix, rate control as above, cardiology following. Salt and fluid restriction. Monitor intake and output and daily weights.  -ve 3 Lits  Filed Weights   04/17/16 2200 04/18/16 0300 04/19/16 0500  Weight: (!) 136.8 kg (301 lb 9.4 oz) 135 kg (297 lb 9.9 oz) 134.7 kg (297 lb)    3. Hypertension. For now Coreg and ACE inhibitor along with Cardizem. Monitor blood pressure last reading was not accurate.  4. Dyslipidemia. On statin continue.   5. Mild elevation  in troponin. Trend is flat and non-ACS pattern, due to demand ischemia from RVR, chest pain-free, continue heparin drip and beta blocker, cardiology to evaluate, check echocardiogram to evaluate EF and wall motion.   6. Bilateral foot cellulitis appears to be more fungal. Placed on nystatin powder, added oral Clindamycin in case there is a bacterial secondary infection.  Monitor.   7. DM type II. No medications and poor compliance, A1c greater than 15, placed on Lantus and sliding scale, diabetic education.  Lab Results  Component Value Date   HGBA1C 6.2 (H) 04/18/2016   CBG (last 3)   Recent Labs  04/18/16 1754 04/18/16 2101 04/19/16 0815  GLUCAP 90 89 94      Diet : Diet Carb Modified Fluid consistency: Thin; Room service appropriate? Yes; Fluid restriction: 1500 mL Fluid    Family Communication  :  None  Code Status :  Full  Disposition Plan  :  Home 2-3 days  Consults  :  Cards  Procedures  :    TTE  DVT Prophylaxis  :   Heparin   Lab Results  Component Value Date   PLT 208 04/19/2016    Inpatient Medications  Scheduled Meds: . aspirin EC  81 mg Oral Daily  . atorvastatin  80 mg Oral q1800  . clindamycin  300 mg Oral Q6H  . furosemide  40 mg Intravenous BID  . [START ON 04/20/2016] Influenza vac split quadrivalent PF  0.5 mL Intramuscular Tomorrow-1000  . insulin aspart  0-9 Units Subcutaneous TID WC  . insulin glargine  12 Units Subcutaneous Daily  . insulin starter kit- pen needles  1 kit Other Once  . insulin starter kit- syringes  1 kit Other Once  . lisinopril  2.5 mg Oral Daily  . metoprolol tartrate  75 mg Oral BID  . nystatin   Topical TID  . potassium chloride  20 mEq Oral Daily   Continuous Infusions: . heparin 1,700 Units/hr (04/19/16 1100)   PRN Meds:.hydrALAZINE, metoprolol, ondansetron **OR** ondansetron (ZOFRAN) IV  Antibiotics  :    Anti-infectives    Start     Dose/Rate Route Frequency Ordered Stop   04/19/16 0830  clindamycin  (CLEOCIN) capsule 300 mg     300 mg Oral Every 6 hours 04/19/16 0803           Objective:   Vitals:   04/19/16 0500 04/19/16 0800 04/19/16 0818 04/19/16 0820  BP:    (!) 111/96  Pulse:      Resp:  (!) 22    Temp:   97.5 F (36.4 C)   TempSrc:   Oral   SpO2:      Weight: 134.7 kg (297 lb)     Height:        Wt Readings from Last 3 Encounters:  04/19/16 134.7 kg (297 lb)  10/04/15 102.2 kg (225 lb 3.2 oz)  09/21/15 100.2 kg (221 lb)     Intake/Output Summary (Last 24 hours) at 04/19/16 1200 Last data filed at 04/19/16 1100  Gross per 24 hour  Intake          2312.05 ml  Output             2615 ml  Net          -302.95 ml     Physical Exam  Awake Alert, Oriented X 3, No new F.N deficits, Normal affect [place].AT,PERRAL Supple Neck,No JVD, No cervical lymphadenopathy appriciated.  Symmetrical Chest wall movement, Good air movement bilaterally, CTAB iRRR,No Gallops,Rubs or new Murmurs, No Parasternal Heave +ve B.Sounds, Abd Soft, No tenderness, No organomegaly appriciated, No rebound - guarding or rigidity. No Cyanosis, Clubbing or edema, No new Rash or bruise       Data Review:    CBC  Recent Labs Lab 04/17/16 1713 04/18/16 0508 04/19/16 0231  WBC  7.6 8.4 8.2  HGB 15.2 15.9 15.9  HCT 45.4 47.6 47.4  PLT 234 245 208  MCV 84.4 85.0 85.4  MCH 28.3 28.4 28.6  MCHC 33.5 33.4 33.5  RDW 15.3 15.3 15.3  LYMPHSABS  --  3.1  --   MONOABS  --  0.9  --   EOSABS  --  0.2  --   BASOSABS  --  0.0  --     Chemistries   Recent Labs Lab 04/17/16 1713 04/17/16 2329 04/18/16 0508 04/19/16 0231  NA 137  --  136 136  K 3.9  --  3.4* 4.1  CL 100*  --  101 98*  CO2 26  --  28 26  GLUCOSE 72  --  90 92  BUN 18  --  15 16  CREATININE 1.15  --  1.07 1.13  CALCIUM 8.8*  --  8.3* 8.8*  MG  --  1.7  --   --   AST  --   --  46*  --   ALT  --   --  36  --   ALKPHOS  --   --  68  --   BILITOT  --   --  1.0  --     ------------------------------------------------------------------------------------------------------------------  Recent Labs  04/18/16 1459  CHOL 130  HDL 35*  LDLCALC 78  TRIG 86  CHOLHDL 3.7    Lab Results  Component Value Date   HGBA1C 6.2 (H) 04/18/2016   ------------------------------------------------------------------------------------------------------------------  Recent Labs  04/17/16 2329  TSH 1.548   ------------------------------------------------------------------------------------------------------------------ No results for input(s): VITAMINB12, FOLATE, FERRITIN, TIBC, IRON, RETICCTPCT in the last 72 hours.  Coagulation profile  Recent Labs Lab 04/17/16 1713  INR 1.22    No results for input(s): DDIMER in the last 72 hours.  Cardiac Enzymes  Recent Labs Lab 04/17/16 2329 04/18/16 0508 04/18/16 1135  TROPONINI 1.85* 2.10* 1.67*   ------------------------------------------------------------------------------------------------------------------    Component Value Date/Time   BNP 659.8 (H) 04/17/2016 1713    Micro Results Recent Results (from the past 240 hour(s))  MRSA PCR Screening     Status: None   Collection Time: 04/17/16  9:59 PM  Result Value Ref Range Status   MRSA by PCR NEGATIVE NEGATIVE Final    Comment:        The GeneXpert MRSA Assay (FDA approved for NASAL specimens only), is one component of a comprehensive MRSA colonization surveillance program. It is not intended to diagnose MRSA infection nor to guide or monitor treatment for MRSA infections.     Radiology Reports Dg Chest 2 View  Result Date: 04/17/2016 CLINICAL DATA:  Shortness of breath with exertion. History of atrial fibrillation. EXAM: CHEST  2 VIEW COMPARISON:  PA and lateral chest 04/02/2014 and CT chest 04/03/2014. FINDINGS: There is cardiomegaly and mild interstitial edema. Small left effusion is identified. No consolidative process or  pneumothorax. IMPRESSION: Cardiomegaly mild interstitial edema with a small left pleural effusion. Electronically Signed   By: Inge Rise M.D.   On: 04/17/2016 15:20    Time Spent in minutes  30   SINGH,PRASHANT K M.D on 04/19/2016 at 12:00 PM  Between 7am to 7pm - Pager - [phone]  After 7pm go to [email or web address] - password [facility]  [facility] -  Office  [phone]

## 2016-04-19 NOTE — Progress Notes (Signed)
 Progress Note  Patient Name: Darius [name] Date of Encounter: 04/19/2016  Primary Cardiologist: Dr. Dalton McLean  Subjective   Sitting in bed side chair. No palpitations or chest pain currently. Has bilateral foot discomfort.  Inpatient Medications    Scheduled Meds: . aspirin EC  81 mg Oral Daily  . atorvastatin  80 mg Oral q1800  . clindamycin  300 mg Oral Q6H  . furosemide  40 mg Intravenous BID  . insulin aspart  0-9 Units Subcutaneous TID WC  . insulin glargine  12 Units Subcutaneous Daily  . insulin starter kit- pen needles  1 kit Other Once  . insulin starter kit- syringes  1 kit Other Once  . lisinopril  2.5 mg Oral Daily  . metoprolol tartrate  75 mg Oral BID  . nystatin   Topical TID  . potassium chloride  20 mEq Oral Daily   Continuous Infusions: . heparin 1,700 Units/hr (04/19/16 0645)   PRN Meds: hydrALAZINE, metoprolol, ondansetron **OR** ondansetron (ZOFRAN) IV   Vital Signs    Vitals:   04/19/16 0340 04/19/16 0400 04/19/16 0500 04/19/16 0818  BP: 110/78 96/78    Pulse: 91 92    Resp: (!) 22 (!) 23    Temp:    97.5 F (36.4 C)  TempSrc:    Oral  SpO2: (!) 89% (!) 72%    Weight:   297 lb (134.7 kg)   Height:        Intake/Output Summary (Last 24 hours) at 04/19/16 1008 Last data filed at 04/19/16 0734  Gross per 24 hour  Intake           2239.8 ml  Output             2715 ml  Net           -475.2 ml   Filed Weights   04/17/16 2200 04/18/16 0300 04/19/16 0500  Weight: (!) 301 lb 9.4 oz (136.8 kg) 297 lb 9.9 oz (135 kg) 297 lb (134.7 kg)    Telemetry    Currently atrial fibrillation. Personally reviewed.  ECG    Tracing from 04/17/2016 showed rapid atrial fibrillation with rightward axis and low voltage. Personally reviewed.  Physical Exam   GEN: Morbidly obese, no acute distress.   Neck: No JVD. Cardiac:  Indistinct PMI, irregularly irregular without gallop.  Respiratory: Nonlabored. Clear to auscultation bilaterally. GI:   Obese, nontender, bowel sounds present. MS:  Mild leg edema, erythema and rash bilateral feet; onychomycosis, No deformity. Neuro:  Nonfocal. Psych: Alert and oriented x 3. Normal affect.  Labs    Chemistry  Recent Labs Lab 04/17/16 1713 04/18/16 0508 04/19/16 0231  NA 137 136 136  K 3.9 3.4* 4.1  CL 100* 101 98*  CO2 26 28 26  GLUCOSE 72 90 92  BUN 18 15 16  CREATININE 1.15 1.07 1.13  CALCIUM 8.8* 8.3* 8.8*  PROT  --  6.1*  --   ALBUMIN  --  3.2*  --   AST  --  46*  --   ALT  --  36  --   ALKPHOS  --  68  --   BILITOT  --  1.0  --   GFRNONAA >60 >60 >60  GFRAA >60 >60 >60  ANIONGAP 11 7 12     Hematology  Recent Labs Lab 04/17/16 1713 04/18/16 0508 04/19/16 0231  WBC 7.6 8.4 8.2  RBC 5.38 5.60 5.55  HGB 15.2 15.9 15.9  HCT 45.4 47.6   47.4  MCV 84.4 85.0 85.4  MCH 28.3 28.4 28.6  MCHC 33.5 33.4 33.5  RDW 15.3 15.3 15.3  PLT 234 245 208    Cardiac Enzymes  Recent Labs Lab 04/17/16 2329 04/18/16 0508 04/18/16 1135  TROPONINI 1.85* 2.10* 1.67*     Recent Labs Lab 04/17/16 1725  TROPIPOC 1.73*     BNP  Recent Labs Lab 04/17/16 1713  BNP 659.8*     Radiology    Dg Chest 2 View  Result Date: 04/17/2016 CLINICAL DATA:  Shortness of breath with exertion. History of atrial fibrillation. EXAM: CHEST  2 VIEW COMPARISON:  PA and lateral chest 04/02/2014 and CT chest 04/03/2014. FINDINGS: There is cardiomegaly and mild interstitial edema. Small left effusion is identified. No consolidative process or pneumothorax. IMPRESSION: Cardiomegaly mild interstitial edema with a small left pleural effusion. Electronically Signed   By: Thomas  Dalessio M.D.   On: 04/17/2016 15:20    Cardiac Studies   Echocardiogram pending.  Patient Profile     60 y.o. male with a history of nonischemic cardiomyopathy, LVEF 45-50% as of 2016, paroxysmal atrial fibrillation status post failed TEE cardioversion in 2013 but successful cardioversion in 2016, noncompliance  with medical therapy, OSA noncompliant with CPAP, and type 2 diabetes mellitus. He presents now with atrial fibrillation associated with RVR and acute on chronic systolic heart failure.  Assessment & Plan    1. History of paroxysmal atrial fibrillation, currently with persistent episode and RVR. Has history of medication noncompliance complicating management. CHADSVASC score is at least 3. Had been on Eliquis previously. He has been placed on Lopressor with 3 small heart rate control at this time.  2. History of nonischemic cardiomyopathy with LVEF 45-50% as of 2016. Follow-up echocardiogram is pending.  3. Elevated troponin I, peak 2.1. NSTEMI possible, although could be due to demand ischemia in the setting of uncontrolled atrial fibrillation. No active chest pain.  4. Acute on chronic combined heart failure, diuresing on IV Lasix. Complicated by problem #1 as well.  5. Type 2 diabetes mellitus, poorly controlled. Diabetes teaching requested.  6. Possible cellulitis of the feet. Per primary team.  7. OSA, noncompliant with CPAP.  Current regimen includes aspirin, Lipitor, Lasix 40 mg IV twice daily with potassium supplements, lisinopril, Lopressor and IV heparin. Await follow-up echocardiogram. As mentioned in Dr. Skain's consultation, if there is further decline in LVEF, may need to consider cardiac catheterization in light of abnormal troponin I levels. Then decision can be made about plan for longer term anticoagulation.  Signed,  , MD 04/19/2016, 10:08 AM    

## 2016-04-19 NOTE — Progress Notes (Signed)
*  PRELIMINARY RESULTS* Echocardiogram 2D Echocardiogram has been performed.  Leavy Cella 04/19/2016, 4:14 PM

## 2016-04-19 NOTE — Progress Notes (Signed)
ANTICOAGULATION CONSULT NOTE - Follow Up Consult  Pharmacy Consult for heparin Indication: atrial fibrillation  Labs:  Recent Labs  04/17/16 1713 04/17/16 2329  04/18/16 0508 04/18/16 0850 04/18/16 1135 04/18/16 1710 04/19/16 0231  HGB 15.2  --   --  15.9  --   --   --  15.9  HCT 45.4  --   --  47.6  --   --   --  47.4  PLT 234  --   --  245  --   --   --  208  LABPROT 15.5*  --   --   --   --   --   --   --   INR 1.22  --   --   --   --   --   --   --   HEPARINUNFRC  --   --   < >  --  0.73*  --  0.72* 0.34  CREATININE 1.15  --   --  1.07  --   --   --   --   TROPONINI  --  1.85*  --  2.10*  --  1.67*  --   --   < > = values in this interval not displayed.  Assessment: 61yo male now therapeutic on heparin though at low end of goal.  Goal of Therapy:  Heparin level 0.3-0.7 units/ml   Plan:  Will increase gtt slightly to 1700 units/hr and check level in 6hr.  Wynona Neat, PharmD, BCPS  04/19/2016,3:15 AM

## 2016-04-19 NOTE — Progress Notes (Signed)
Inpatient Diabetes Program Recommendations  AACE/ADA: New Consensus Statement on Inpatient Glycemic Control (2015)  Target Ranges:  Prepandial:   less than 140 mg/dL      Peak postprandial:   less than 180 mg/dL (1-2 hours)      Critically ill patients:  140 - 180 mg/dL   Lab Results  Component Value Date   GLUCAP 94 04/19/2016   HGBA1C 6.2 (H) 04/18/2016    Review of Glycemic Control:  Results for ISIDORO, SANTILLANA (MRN [id number]) as of 04/19/2016 08:36  Ref. Range 04/18/2016 08:20 04/18/2016 12:14 04/18/2016 17:54 04/18/2016 21:01 04/19/2016 08:15  Glucose-Capillary Latest Ref Range: 65 - 99 mg/dL 89 125 (H) 90 89 94    Diabetes history: Type 2 diabetes Outpatient Diabetes medications: None Current orders for Inpatient glycemic control:  Lantus 12 units daily, Novolog sensitive tid with meals  Inpatient Diabetes Program Recommendations:    Note that A1C indicates that blood sugars have been well controlled prior to admission.  Therefore patient should not need insulin at discharge. May consider oral medication such as Metformin instead?  Will follow.  Thanks, Adah Perl, RN, BC-ADM Inpatient Diabetes Coordinator Pager [phone] (8a-5p)

## 2016-04-20 DIAGNOSIS — I429 Cardiomyopathy, unspecified: Secondary | ICD-10-CM

## 2016-04-20 LAB — BASIC METABOLIC PANEL
Anion gap: 8 (ref 5–15)
BUN: 19 mg/dL (ref 6–20)
CHLORIDE: 99 mmol/L — AB (ref 101–111)
CO2: 27 mmol/L (ref 22–32)
CREATININE: 1.28 mg/dL — AB (ref 0.61–1.24)
Calcium: 8.6 mg/dL — ABNORMAL LOW (ref 8.9–10.3)
GFR calc non Af Amer: 58 mL/min — ABNORMAL LOW (ref >60)
Glucose, Bld: 102 mg/dL — ABNORMAL HIGH (ref 65–99)
POTASSIUM: 3.8 mmol/L (ref 3.5–5.1)
SODIUM: 134 mmol/L — AB (ref 135–145)

## 2016-04-20 LAB — CBC
HEMATOCRIT: 46 % (ref 39.0–52.0)
HEMOGLOBIN: 15.2 g/dL (ref 13.0–17.0)
MCH: 28 pg (ref 26.0–34.0)
MCHC: 33 g/dL (ref 30.0–36.0)
MCV: 84.7 fL (ref 78.0–100.0)
Platelets: 239 10*3/uL (ref 150–400)
RBC: 5.43 MIL/uL (ref 4.22–5.81)
RDW: 15.5 % (ref 11.5–15.5)
WBC: 7.9 10*3/uL (ref 4.0–10.5)

## 2016-04-20 LAB — MAGNESIUM: MAGNESIUM: 2.2 mg/dL (ref 1.7–2.4)

## 2016-04-20 LAB — HEPARIN LEVEL (UNFRACTIONATED): HEPARIN UNFRACTIONATED: 0.38 [IU]/mL (ref 0.30–0.70)

## 2016-04-20 MED ORDER — METOPROLOL SUCCINATE ER 50 MG PO TB24
50.0000 mg | ORAL_TABLET | Freq: Two times a day (BID) | ORAL | Status: DC
  Administered 2016-04-20 – 2016-04-24 (×8): 50 mg via ORAL
  Filled 2016-04-20 (×9): qty 1

## 2016-04-20 MED ORDER — FUROSEMIDE 10 MG/ML IJ SOLN: 40.0000 mg | Freq: Every day | INTRAMUSCULAR | Status: DC

## 2016-04-20 MED ORDER — CLOTRIMAZOLE 1 % EX CREA
TOPICAL_CREAM | Freq: Two times a day (BID) | CUTANEOUS | Status: DC
  Administered 2016-04-20: 17:00:00 via TOPICAL
  Administered 2016-04-20 – 2016-04-22 (×3): 1 via TOPICAL
  Administered 2016-04-22 – 2016-04-24 (×5): via TOPICAL
  Filled 2016-04-20 (×4): qty 15

## 2016-04-20 NOTE — Progress Notes (Signed)
Progress Note  Patient Name: Darius [name] Date of Encounter: 04/20/2016  Primary Cardiologist: Dr. Loralie Champagne  Subjective   No chest pain, no sense of palpitations. Still short of breath when moving around. Appetite stable, no nausea.  Inpatient Medications    Scheduled Meds: . aspirin EC  81 mg Oral Daily  . atorvastatin  80 mg Oral q1800  . clindamycin  300 mg Oral Q6H  . furosemide  40 mg Intravenous BID  . Influenza vac split quadrivalent PF  0.5 mL Intramuscular Tomorrow-1000  . insulin aspart  0-9 Units Subcutaneous TID WC  . insulin glargine  12 Units Subcutaneous Daily  . insulin starter kit- pen needles  1 kit Other Once  . insulin starter kit- syringes  1 kit Other Once  . lisinopril  2.5 mg Oral Daily  . metoprolol tartrate  75 mg Oral BID  . nystatin   Topical TID  . potassium chloride  20 mEq Oral Daily   Continuous Infusions: . heparin 1,700 Units/hr (04/20/16 0500)   PRN Meds: hydrALAZINE, metoprolol, ondansetron **OR** ondansetron (ZOFRAN) IV   Vital Signs    Vitals:   04/20/16 0000 04/20/16 0300 04/20/16 0400 04/20/16 0425  BP:  (!) 77/56  99/79  Pulse:  91    Resp: 20 14 (!) 22 13  Temp:  98.2 F (36.8 C)    TempSrc:  Axillary    SpO2:      Weight:  298 lb 15.1 oz (135.6 kg)    Height:        Intake/Output Summary (Last 24 hours) at 04/20/16 0828 Last data filed at 04/20/16 0500  Gross per 24 hour  Intake          1678.25 ml  Output             1500 ml  Net           178.25 ml   Filed Weights   04/18/16 0300 04/19/16 0500 04/20/16 0300  Weight: 297 lb 9.9 oz (135 kg) 297 lb (134.7 kg) 298 lb 15.1 oz (135.6 kg)    Telemetry    Currently atrial fibrillation. Personally reviewed.  ECG    Tracing from 04/17/2016 showed rapid atrial fibrillation with rightward axis and low voltage. Personally reviewed.  Physical Exam   GEN: Morbidly obese, no acute distress.   Neck: No JVD. Cardiac:  Indistinct PMI, irregularly irregular  without gallop.  Respiratory: Nonlabored. Clear to auscultation bilaterally. GI:  Obese, nontender, bowel sounds present. MS:  Mild leg edema, erythema and rash bilateral feet; onychomycosis, No deformity. Neuro:  Nonfocal. Psych: Alert and oriented x 3. Normal affect.  Labs    Chemistry  Recent Labs Lab 04/18/16 0508 04/19/16 0231 04/20/16 0342  NA 136 136 134*  K 3.4* 4.1 3.8  CL 101 98* 99*  CO2 _0 GLUCOSE 90 92 102*  BUN _1 CREATININE 1.07 1.13 1.28*  CALCIUM 8.3* 8.8* 8.6*  PROT 6.1*  --   --   ALBUMIN 3.2*  --   --   AST 46*  --   --   ALT 36  --   --   ALKPHOS 68  --   --   BILITOT 1.0  --   --   GFRNONAA >60 >60 58*  GFRAA >60 >60 >60  ANIONGAP _2 Hematology  Recent Labs Lab 04/18/16 0508 04/19/16 0231 04/20/16 0342  WBC 8.4 8.2 7.9  RBC 5.60 5.55 5.43  HGB 15.9 15.9 15.2  HCT 47.6 47.4 46.0  MCV 85.0 85.4 84.7  MCH 28.4 28.6 28.0  MCHC 33.4 33.5 33.0  RDW 15.3 15.3 15.5  PLT 245 208 239    Cardiac Enzymes  Recent Labs Lab 04/17/16 2329 04/18/16 0508 04/18/16 1135  TROPONINI 1.85* 2.10* 1.67*     Recent Labs Lab 04/17/16 1725  TROPIPOC 1.73*     BNP  Recent Labs Lab 04/17/16 1713  BNP 659.8*     Radiology    Chest x-ray 04/17/2016: FINDINGS: There is cardiomegaly and mild interstitial edema. Small left effusion is identified. No consolidative process or pneumothorax.  IMPRESSION: Cardiomegaly mild interstitial edema with a small left pleural effusion.  Cardiac Studies   Echocardiogram 04/19/2016: Study Conclusions  - Left ventricle: The cavity size was mildly dilated. Wall   thickness was increased in a pattern of mild LVH. Systolic   function was severely reduced. The estimated ejection fraction   was 15%. Diffuse hypokinesis. The study is not technically   sufficient to allow evaluation of LV diastolic function. - Aortic valve: Mildly calcified annulus. Trileaflet. - Mitral valve:  Calcified annulus. There was mild regurgitation. - Right ventricle: The cavity size was moderately dilated. Systolic   function was severely reduced. - Right atrium: Central venous pressure (est): 15 mm Hg. - Atrial septum: No defect or patent foramen ovale was identified. - Tricuspid valve: There was mild regurgitation. - Pulmonary arteries: PA peak pressure: 38 mm Hg (S). - Pericardium, extracardiac: A trivial pericardial effusion was   identified.  Impressions:  - Mild LVH with mild chamber dilatation and LVEF approximately 15%,   overall diffuse hypokinesis. Indeterminate diastolic function.   Mild mitral regurgitation. Moderate right ventricular enlargement   with severely reduced contraction. Mild tricuspid regurgitation   with PASP 38 mmHg and elevated CVP. Trivial pericardial effusion.  Patient Profile     61 y.o. male with a history of nonischemic cardiomyopathy, LVEF 45-50% as of 2016, paroxysmal atrial fibrillation status post failed TEE cardioversion in 2013 but successful cardioversion in 2016, noncompliance with medical therapy, OSA noncompliant with CPAP, and type 2 diabetes mellitus. He presents now with atrial fibrillation associated with RVR and acute on chronic systolic heart failure. Follow-up LVEF now approximately 15%.  Assessment & Plan    1. History of paroxysmal atrial fibrillation, currently with persistent episode and RVR. Has history of medication noncompliance complicating management. CHADSVASC score is at least 3. Had been on Eliquis previously, currently on IV heparin.  2. History of nonischemic cardiomyopathy with LVEF 45-50% as of 2016. Follow-up echocardiogram shows LVEF down to 15%. Would be most suspicious of tachycardia-mediated cardiomyopathy in light of his atrial fibrillation medication noncompliance, although ischemic heart disease is also a consideration.  3. Elevated troponin I, peak 2.1. NSTEMI possible, although could be due to demand  ischemia in the setting of uncontrolled atrial fibrillation. No active chest pain.  4. Acute on chronic combined heart failure, diuresing on IV Lasix. Complicated by problem #1 as well.  5. Type 2 diabetes mellitus, poorly controlled. Diabetes teaching requested.  6. Possible cellulitis of the feet. Per primary team.  7. OSA, noncompliant with CPAP.  Difficult management strategy. Patient seems to have poor insight into his illness and prior noncompliance. Plan to switch from Lopressor to Toprol-XL, hold off on low-dose lisinopril for now given intermittently low blood pressures and slight bump in creatinine. Continue IV Lasix at reduced dose. Stay on IV heparin for   now pending further testing. Suspect that he will need to undergo right and left heart catheterization to better understand hemodynamics and coronary status and then make medication adjustments from there. Dr. McLean can follow-up with him on the CHF team this week.   Signed, Samuel McDowell, MD 04/20/2016, 8:28 AM    

## 2016-04-20 NOTE — Progress Notes (Signed)
PROGRESS NOTE                                                                                                                                                                                                             Patient Demographics:    Darius [name], is a 60 y.o. male, DOB - 1955-05-04, LRJ:[id number]  Admit date - 04/17/2016   Admitting Physician Rise Patience, MD  Outpatient Primary MD for the patient is [name]., DO  LOS - 3  Chief Complaint  Patient presents with  . Atrial Fibrillation       Brief Narrative   Darius [name] is a 61 y.o. male with history of chronic combined systolic and diastolic CHF, atrial fibrillation, diabetes mellitus who has not been taking his cardiac medications for last 2 years presents to the ER because of worsening shortness of breath on exertion with palpitations.   Subjective:    Darius [name] today has, No headache, No chest pain, No abdominal pain - No Nausea, No new weakness tingling or numbness, No Cough - SOB.     Assessment  & Plan :     1.Chronic A. fib with RVR. Cardizem drip has been stopped he is currently on Lopressor will try to maintain on that, echocardiogram noted, TSH stable, he is currently on heparin drip. He has long-standing history of A. fib and noncompliance with medications, Counseled. Cardiology also consulted.   2. Acute on chronic combined systolic diastolic CHF last EF 70%. Continue Lasix, rate control as above, cardiology following. Salt and fluid restriction. Monitor intake and output and daily weights.  -ve 1.7 Lits ( not accurate)  Filed Weights   04/18/16 0300 04/19/16 0500 04/20/16 0300  Weight: 135 kg (297 lb 9.9 oz) 134.7 kg (297 lb) 135.6 kg (298 lb 15.1 oz)    3. Hypertension. For now Lopressor and ACE inhibitor  . Monitor blood pressure last reading was not accurate.  4. Dyslipidemia. On statin continue.   5. Mild elevation  in troponin. Trend is flat and non-ACS pattern, due to demand ischemia from RVR, chest pain-free, continue heparin drip and beta blocker, cardiology to evaluate, echocardiogram reveals severely depressed EF of 15% with diffuse hypokinesis, likely left heart catheterization on 04/21/2016.   6. Bilateral foot tinea pedis with superimposed mild bacterial cellulitis. Placed on Chlortrimazole cream  along with oral clindamycin. Monitor.   7. Pre DM . For now  sliding scale, diabetic education.  Lab Results  Component Value Date   HGBA1C 6.2 (H) 04/18/2016   CBG (last 3)   Recent Labs  04/19/16 1245 04/19/16 1745 04/19/16 2121  GLUCAP 161* 92 93      Diet : Diet Carb Modified Fluid consistency: Thin; Room service appropriate? Yes; Fluid restriction: 1500 mL Fluid    Family Communication  :  None  Code Status :  Full  Disposition Plan  :  Home 2-3 days  Consults  :  Cards  Procedures  :    TTE Mild LVH with mild chamber dilatation and LVEF approximately 15%, overall diffuse hypokinesis. Indeterminate diastolic function. Mild mitral regurgitation. Moderate right ventricular enlargement with severely reduced contraction. Mild tricuspid regurgitation with PASP 38 mmHg and elevated CVP. Trivial pericardial effusion.    DVT Prophylaxis  :   Heparin   Lab Results  Component Value Date   PLT 239 04/20/2016    Inpatient Medications  Scheduled Meds: . aspirin EC  81 mg Oral Daily  . atorvastatin  80 mg Oral q1800  . clindamycin  300 mg Oral Q6H  . clotrimazole   Topical BID  . [START ON 04/21/2016] furosemide  40 mg Intravenous Daily  . Influenza vac split quadrivalent PF  0.5 mL Intramuscular Tomorrow-1000  . insulin aspart  0-9 Units Subcutaneous TID WC  . insulin starter kit- pen needles  1 kit Other Once  . insulin starter kit- syringes  1 kit Other Once  . metoprolol succinate  50 mg Oral BID  . potassium chloride  20 mEq Oral Daily   Continuous Infusions: . heparin  1,700 Units/hr (04/20/16 0700)   PRN Meds:.hydrALAZINE, ondansetron **OR** ondansetron (ZOFRAN) IV  Antibiotics  :    Anti-infectives    Start     Dose/Rate Route Frequency Ordered Stop   04/19/16 0830  clindamycin (CLEOCIN) capsule 300 mg     300 mg Oral Every 6 hours 04/19/16 0803           Objective:   Vitals:   04/20/16 0700 04/20/16 0800 04/20/16 0900 04/20/16 0909  BP:    108/85  Pulse:   (!) 52 98  Resp: '16 15 13 ' (!) 23  Temp:    97.9 F (36.6 C)  TempSrc:    Oral  SpO2:   91% 93%  Weight:      Height:        Wt Readings from Last 3 Encounters:  04/20/16 135.6 kg (298 lb 15.1 oz)  10/04/15 102.2 kg (225 lb 3.2 oz)  09/21/15 100.2 kg (221 lb)     Intake/Output Summary (Last 24 hours) at 04/20/16 1130 Last data filed at 04/20/16 1000  Gross per 24 hour  Intake             2051 ml  Output              700 ml  Net             1351 ml     Physical Exam  Awake Alert, Oriented X 3, No new F.N deficits, Normal affect [place].AT,PERRAL Supple Neck,No JVD, No cervical lymphadenopathy appriciated.  Symmetrical Chest wall movement, Good air movement bilaterally, CTAB iRRR,No Gallops,Rubs or new Murmurs, No Parasternal Heave +ve B.Sounds, Abd Soft, No tenderness, No organomegaly appriciated, No rebound - guarding or rigidity. No Cyanosis, Clubbing or edema, No new Rash or bruise,  Both feet have chronic tenia pedis       Data Review:    CBC  Recent Labs Lab 04/17/16 1713 04/18/16 0508 04/19/16 0231 04/20/16 0342  WBC 7.6 8.4 8.2 7.9  HGB 15.2 15.9 15.9 15.2  HCT 45.4 47.6 47.4 46.0  PLT 234 245 208 239  MCV 84.4 85.0 85.4 84.7  MCH 28.3 28.4 28.6 28.0  MCHC 33.5 33.4 33.5 33.0  RDW 15.3 15.3 15.3 15.5  LYMPHSABS  --  3.1  --   --   MONOABS  --  0.9  --   --   EOSABS  --  0.2  --   --   BASOSABS  --  0.0  --   --     Chemistries   Recent Labs Lab 04/17/16 1713 04/17/16 2329 04/18/16 0508 04/19/16 0231 04/20/16 0342  NA 137  --  136 136 134*   K 3.9  --  3.4* 4.1 3.8  CL 100*  --  101 98* 99*  CO2 26  --  '28 26 27  ' GLUCOSE 72  --  90 92 102*  BUN 18  --  '15 16 19  ' CREATININE 1.15  --  1.07 1.13 1.28*  CALCIUM 8.8*  --  8.3* 8.8* 8.6*  MG  --  1.7  --   --  2.2  AST  --   --  46*  --   --   ALT  --   --  36  --   --   ALKPHOS  --   --  68  --   --   BILITOT  --   --  1.0  --   --    ------------------------------------------------------------------------------------------------------------------  Recent Labs  04/18/16 1459  CHOL 130  HDL 35*  LDLCALC 78  TRIG 86  CHOLHDL 3.7    Lab Results  Component Value Date   HGBA1C 6.2 (H) 04/18/2016   ------------------------------------------------------------------------------------------------------------------  Recent Labs  04/17/16 2329  TSH 1.548   ------------------------------------------------------------------------------------------------------------------ No results for input(s): VITAMINB12, FOLATE, FERRITIN, TIBC, IRON, RETICCTPCT in the last 72 hours.  Coagulation profile  Recent Labs Lab 04/17/16 1713  INR 1.22    No results for input(s): DDIMER in the last 72 hours.  Cardiac Enzymes  Recent Labs Lab 04/17/16 2329 04/18/16 0508 04/18/16 1135  TROPONINI 1.85* 2.10* 1.67*   ------------------------------------------------------------------------------------------------------------------    Component Value Date/Time   BNP 659.8 (H) 04/17/2016 1713    Micro Results Recent Results (from the past 240 hour(s))  MRSA PCR Screening     Status: None   Collection Time: 04/17/16  9:59 PM  Result Value Ref Range Status   MRSA by PCR NEGATIVE NEGATIVE Final    Comment:        The GeneXpert MRSA Assay (FDA approved for NASAL specimens only), is one component of a comprehensive MRSA colonization surveillance program. It is not intended to diagnose MRSA infection nor to guide or monitor treatment for MRSA infections.     Radiology  Reports Dg Chest 2 View  Result Date: 04/17/2016 CLINICAL DATA:  Shortness of breath with exertion. History of atrial fibrillation. EXAM: CHEST  2 VIEW COMPARISON:  PA and lateral chest 04/02/2014 and CT chest 04/03/2014. FINDINGS: There is cardiomegaly and mild interstitial edema. Small left effusion is identified. No consolidative process or pneumothorax. IMPRESSION: Cardiomegaly mild interstitial edema with a small left pleural effusion. Electronically Signed   By: Inge Rise M.D.   On: 04/17/2016 15:20  Time Spent in minutes  30   Lala Lund K M.D on 04/20/2016 at 11:30 AM  Between 7am to 7pm - Pager - [phone]  After 7pm go to [email or web address] - password [facility]  [facility] -  Office  [phone]

## 2016-04-20 NOTE — Progress Notes (Signed)
Darius [name] for Heparin Indication: atrial fibrillation  No Known Allergies  Patient Measurements: Height: _0  (188 cm) Weight: 298 lb 15.1 oz (135.6 kg) IBW/kg (Calculated) : 82.2 Heparin Dosing Weight: 114kg  Vital Signs: Temp: 97.9 F (36.6 C) (03/11 1209) Temp Source: Oral (03/11 1209) BP: 110/67 (03/11 1209) Pulse Rate: 86 (03/11 1100)  Labs:  Recent Labs  04/17/16 1713 04/17/16 2329  04/18/16 0508  04/18/16 1135  04/19/16 0231 04/19/16 1124 04/20/16 0342  HGB 15.2  --   --  15.9  --   --   --  15.9  --  15.2  HCT 45.4  --   --  47.6  --   --   --  47.4  --  46.0  PLT 234  --   --  245  --   --   --  208  --  239  LABPROT 15.5*  --   --   --   --   --   --   --   --   --   INR 1.22  --   --   --   --   --   --   --   --   --   HEPARINUNFRC  --   --   < >  --   < >  --   < > 0.34 0.44 0.38  CREATININE 1.15  --   --  1.07  --   --   --  1.13  --  1.28*  TROPONINI  --  1.85*  --  2.10*  --  1.67*  --   --   --   --   < > = values in this interval not displayed.  Estimated Creatinine Clearance: 87.7 mL/min (by C-G formula based on SCr of 1.28 mg/dL (H)).   Medical History: Past Medical History:  Diagnosis Date  . Atrial fibrillation ([place])    unable to tolerate versed and fentanyl sedation and required gen anesthesia for TEE;  s/p TEE-DCCV (failed);  Amiodarone started  . Chronic diastolic heart failure ([place])    a. Echo 03/27/11: EF 50-55%, inferior hypokinesis, moderate LAE, mild RVE, normal pulmonary pressures. ;   b.  TEE (04/03/11): EF 40-45%, mild MR moderate LAE, no LAA clot, mild RAE;  c.  Echocardiogram (02/2013): EF 60-65%, Gr 2 DD, mildly dilated Ao root (Ao root dimension 39 mm), MAC, mild LAE, normal RVF  . Diabetes mellitus, type 2 ([place])   . Hx of cardiovascular stress test    Lexiscan Myoview (02/2013): No ischemia or scar, EF 51%, low risk  . Morbid obesity ([place])   . Sleep apnea    a. sleep test 01/2013:  very  severy OSA    Medications:  Prescriptions Prior to Admission  Medication Sig Dispense Refill Last Dose  . aspirin EC 325 MG tablet Take 1 tablet (325 mg total) by mouth daily. 90 tablet 3 04/17/2016 at Unknown time  . metFORMIN (GLUCOPHAGE) 1000 MG tablet take 1 tablet by mouth twice a day with food 60 tablet 3 04/17/2016 at Unknown time    Infusions:  . heparin 1,700 Units/hr (04/20/16 0700)   Assessment: 21 yom who came to ED 3/8 from PCP's office after being told he was in A Fib. He was not on any anticoagulation prior to this. CHADS VASc 3(age, CHF, DM).  Baseline CBC is within normal limits and INR 1.22.  Heparin drip 1700 uts/hr HL  0.38 at goal.  No bleeding noted, CBC stable  Goal of Therapy:  Heparin level 0.3-0.7 units/ml Monitor platelets by anticoagulation protocol: Yes   Plan:  -Continue  IV heparin gtt rate to 1700 units/hr. -Daily heparin level and CBC -F/U plans for oral anticoagulation   Bonnita Nasuti Pharm.D. CPP, BCPS Clinical Pharmacist [phone] 04/20/2016 12:12 PM

## 2016-04-21 ENCOUNTER — Encounter (HOSPITAL_COMMUNITY)
Admission: EM | Disposition: A | Discharge status: Home / Self Care | Payer: Self-pay | Source: Home / Self Care | Attending: Internal Medicine

## 2016-04-21 ENCOUNTER — Encounter (HOSPITAL_COMMUNITY): Payer: Self-pay | Admitting: Cardiology

## 2016-04-21 DIAGNOSIS — I429 Cardiomyopathy, unspecified: Secondary | ICD-10-CM

## 2016-04-21 HISTORY — PX: RIGHT/LEFT HEART CATH AND CORONARY ANGIOGRAPHY: CATH118266

## 2016-04-21 LAB — CBC
HCT: 47.2 % (ref 39.0–52.0)
HEMOGLOBIN: 15.7 g/dL (ref 13.0–17.0)
MCH: 28.4 pg (ref 26.0–34.0)
MCHC: 33.3 g/dL (ref 30.0–36.0)
MCV: 85.4 fL (ref 78.0–100.0)
PLATELETS: 258 10*3/uL (ref 150–400)
RBC: 5.53 MIL/uL (ref 4.22–5.81)
RDW: 15.6 % — ABNORMAL HIGH (ref 11.5–15.5)
WBC: 8.6 10*3/uL (ref 4.0–10.5)

## 2016-04-21 LAB — BASIC METABOLIC PANEL
Anion gap: 9 (ref 5–15)
BUN: 19 mg/dL (ref 6–20)
CHLORIDE: 99 mmol/L — AB (ref 101–111)
CO2: 26 mmol/L (ref 22–32)
Calcium: 8.7 mg/dL — ABNORMAL LOW (ref 8.9–10.3)
Creatinine, Ser: 1.2 mg/dL (ref 0.61–1.24)
GFR calc Af Amer: >60 mL/min (ref >60)
GFR calc non Af Amer: >60 mL/min (ref >60)
GLUCOSE: 85 mg/dL (ref 65–99)
POTASSIUM: 4.2 mmol/L (ref 3.5–5.1)
Sodium: 134 mmol/L — ABNORMAL LOW (ref 135–145)

## 2016-04-21 LAB — GLUCOSE, CAPILLARY
GLUCOSE-CAPILLARY: 134 mg/dL — AB (ref 65–99)
GLUCOSE-CAPILLARY: 107 mg/dL — AB (ref 65–99)
Glucose-Capillary: 76 mg/dL (ref 65–99)
Glucose-Capillary: 153 mg/dL — ABNORMAL HIGH (ref 65–99)
Glucose-Capillary: 138 mg/dL — ABNORMAL HIGH (ref 65–99)
Glucose-Capillary: 114 mg/dL — ABNORMAL HIGH (ref 65–99)
Glucose-Capillary: 171 mg/dL — ABNORMAL HIGH (ref 65–99)
Glucose-Capillary: 106 mg/dL — ABNORMAL HIGH (ref 65–99)

## 2016-04-21 LAB — HEPARIN LEVEL (UNFRACTIONATED): Heparin Unfractionated: 0.33 IU/mL (ref 0.30–0.70)

## 2016-04-21 LAB — MAGNESIUM: Magnesium: 2.2 mg/dL (ref 1.7–2.4)

## 2016-04-21 SURGERY — RIGHT/LEFT HEART CATH AND CORONARY ANGIOGRAPHY

## 2016-04-21 MED ORDER — AMIODARONE HCL 200 MG PO TABS
400.0000 mg | ORAL_TABLET | Freq: Two times a day (BID) | ORAL | Status: DC
  Administered 2016-04-21 – 2016-04-24 (×7): 400 mg via ORAL
  Filled 2016-04-21 (×7): qty 2

## 2016-04-21 MED ORDER — SACUBITRIL-VALSARTAN 24-26 MG PO TABS
1.0000 | ORAL_TABLET | Freq: Two times a day (BID) | ORAL | Status: DC
  Administered 2016-04-21 – 2016-04-22 (×2): 1 via ORAL
  Filled 2016-04-21 (×3): qty 1

## 2016-04-21 MED ORDER — ONDANSETRON HCL 4 MG/2ML IJ SOLN: 4.0000 mg | Freq: Four times a day (QID) | INTRAMUSCULAR | Status: DC | PRN

## 2016-04-21 MED ORDER — MIDAZOLAM HCL 2 MG/2ML IJ SOLN
INTRAMUSCULAR | Status: AC
  Filled 2016-04-21: qty 2

## 2016-04-21 MED ORDER — ASPIRIN EC 81 MG PO TBEC: 81.0000 mg | DELAYED_RELEASE_TABLET | Freq: Every day | ORAL | Status: DC

## 2016-04-21 MED ORDER — HEPARIN SODIUM (PORCINE) 1000 UNIT/ML IJ SOLN
INTRAMUSCULAR | Status: AC
  Filled 2016-04-21: qty 1

## 2016-04-21 MED ORDER — VERAPAMIL HCL 2.5 MG/ML IV SOLN
INTRAVENOUS | Status: AC
  Filled 2016-04-21: qty 2

## 2016-04-21 MED ORDER — LIDOCAINE HCL (PF) 1 % IJ SOLN
INTRAMUSCULAR | Status: DC | PRN
  Administered 2016-04-21: 5 mL

## 2016-04-21 MED ORDER — SODIUM CHLORIDE 0.9% FLUSH
3.0000 mL | Freq: Two times a day (BID) | INTRAVENOUS | Status: DC
  Administered 2016-04-21: 3 mL via INTRAVENOUS

## 2016-04-21 MED ORDER — ACETAMINOPHEN 325 MG PO TABS: 650.0000 mg | ORAL_TABLET | ORAL | Status: DC | PRN

## 2016-04-21 MED ORDER — FUROSEMIDE 10 MG/ML IJ SOLN
80.0000 mg | Freq: Two times a day (BID) | INTRAMUSCULAR | Status: DC
  Administered 2016-04-21 – 2016-04-22 (×3): 80 mg via INTRAVENOUS
  Filled 2016-04-21 (×3): qty 8

## 2016-04-21 MED ORDER — HEPARIN (PORCINE) IN NACL 2-0.9 UNIT/ML-% IJ SOLN
INTRAMUSCULAR | Status: AC
  Filled 2016-04-21: qty 1000

## 2016-04-21 MED ORDER — SODIUM CHLORIDE 0.9% FLUSH
3.0000 mL | Freq: Two times a day (BID) | INTRAVENOUS | Status: DC
  Administered 2016-04-21: 10 mL via INTRAVENOUS
  Administered 2016-04-21 – 2016-04-23 (×4): 3 mL via INTRAVENOUS

## 2016-04-21 MED ORDER — APIXABAN 5 MG PO TABS: 5.0000 mg | ORAL_TABLET | Freq: Two times a day (BID) | ORAL | Status: DC

## 2016-04-21 MED ORDER — VERAPAMIL HCL 2.5 MG/ML IV SOLN
INTRAVENOUS | Status: DC | PRN
  Administered 2016-04-21: 10 mL via INTRA_ARTERIAL

## 2016-04-21 MED ORDER — SODIUM CHLORIDE 0.9 % IV SOLN: INTRAVENOUS | Status: DC

## 2016-04-21 MED ORDER — SODIUM CHLORIDE 0.9% FLUSH
3.0000 mL | INTRAVENOUS | Status: DC | PRN
  Administered 2016-04-22: 3 mL via INTRAVENOUS
  Filled 2016-04-21: qty 3

## 2016-04-21 MED ORDER — LIDOCAINE HCL (PF) 1 % IJ SOLN
INTRAMUSCULAR | Status: AC
  Filled 2016-04-21: qty 30

## 2016-04-21 MED ORDER — SODIUM CHLORIDE 0.9 % IV SOLN: 250.0000 mL | INTRAVENOUS | Status: DC | PRN

## 2016-04-21 MED ORDER — ASPIRIN 81 MG PO CHEW
81.0000 mg | CHEWABLE_TABLET | ORAL | Status: AC
  Administered 2016-04-21: 81 mg via ORAL
  Filled 2016-04-21: qty 1

## 2016-04-21 MED ORDER — IOPAMIDOL (ISOVUE-370) INJECTION 76%
INTRAVENOUS | Status: DC | PRN
  Administered 2016-04-21: 60 mL via INTRA_ARTERIAL

## 2016-04-21 MED ORDER — FENTANYL CITRATE (PF) 100 MCG/2ML IJ SOLN
INTRAMUSCULAR | Status: DC | PRN
  Administered 2016-04-21: 25 ug via INTRAVENOUS

## 2016-04-21 MED ORDER — DIGOXIN 125 MCG PO TABS
0.1250 mg | ORAL_TABLET | Freq: Every day | ORAL | Status: DC
  Administered 2016-04-21 – 2016-04-23 (×3): 0.125 mg via ORAL
  Filled 2016-04-21 (×4): qty 1

## 2016-04-21 MED ORDER — FENTANYL CITRATE (PF) 100 MCG/2ML IJ SOLN
INTRAMUSCULAR | Status: AC
  Filled 2016-04-21: qty 2

## 2016-04-21 MED ORDER — SODIUM CHLORIDE 0.9% FLUSH: 3.0000 mL | INTRAVENOUS | Status: DC | PRN

## 2016-04-21 MED ORDER — MIDAZOLAM HCL 2 MG/2ML IJ SOLN
INTRAMUSCULAR | Status: DC | PRN
  Administered 2016-04-21: 1 mg via INTRAVENOUS

## 2016-04-21 MED ORDER — APIXABAN 5 MG PO TABS
5.0000 mg | ORAL_TABLET | Freq: Two times a day (BID) | ORAL | Status: DC
  Administered 2016-04-21 – 2016-04-24 (×6): 5 mg via ORAL
  Filled 2016-04-21 (×6): qty 1

## 2016-04-21 MED ORDER — IOPAMIDOL (ISOVUE-370) INJECTION 76%
INTRAVENOUS | Status: AC
  Filled 2016-04-21: qty 100

## 2016-04-21 MED ORDER — HEPARIN SODIUM (PORCINE) 1000 UNIT/ML IJ SOLN
INTRAMUSCULAR | Status: DC | PRN
  Administered 2016-04-21: 6000 [IU] via INTRAVENOUS

## 2016-04-21 MED ORDER — HEPARIN (PORCINE) IN NACL 2-0.9 UNIT/ML-% IJ SOLN
INTRAMUSCULAR | Status: DC | PRN
  Administered 2016-04-21: 1000 mL

## 2016-04-21 SURGICAL SUPPLY — 12 items
CATH 5FR JL3.5 JR4 ANG PIG MP (CATHETERS) ×2 IMPLANT
CATH BALLN WEDGE 5F 110CM (CATHETERS) ×2 IMPLANT
DEVICE RAD COMP TR BAND LRG (VASCULAR PRODUCTS) ×2 IMPLANT
GLIDESHEATH SLEND SS 6F .021 (SHEATH) ×2 IMPLANT
GUIDEWIRE INQWIRE 1.5J.035X260 (WIRE) IMPLANT
HOVERMATT SINGLE USE (MISCELLANEOUS) ×2 IMPLANT
INQWIRE 1.5J .035X260CM (WIRE) ×3
KIT HEART LEFT (KITS) ×3 IMPLANT
PACK CARDIAC CATHETERIZATION (CUSTOM PROCEDURE TRAY) ×3 IMPLANT
SHEATH FAST CATH BRACH 5F 5CM (SHEATH) ×2 IMPLANT
TRANSDUCER W/STOPCOCK (MISCELLANEOUS) ×3 IMPLANT
TUBING CIL FLEX 10 FLL-RA (TUBING) ×3 IMPLANT

## 2016-04-21 NOTE — Progress Notes (Signed)
PROGRESS NOTE                                                                                                                                                                                                             Patient Demographics:    Darius [name], is a 61 y.o. male, DOB - 25-Nov-1955, TRR:[id number]  Admit date - 04/17/2016   Admitting Physician Rise Patience, MD  Outpatient Primary MD for the patient is [name]., DO  LOS - 4  Chief Complaint  Patient presents with  . Atrial Fibrillation       Brief Narrative   Gerod Caligiuri is a 61 y.o. male with history of chronic combined systolic and diastolic CHF, atrial fibrillation, diabetes mellitus who has not been taking his cardiac medications for last 2 years presents to the ER because of worsening shortness of breath on exertion with palpitations.   Subjective:    Stacie Knutzen today has, No headache, No chest pain, No abdominal pain - No Nausea, No new weakness tingling or numbness, No Cough - Improved SOB.     Assessment  & Plan :     1.Chronic A. fib with RVR. Cardizem drip has been stopped he is currently on Lopressor will try to maintain on that, echocardiogram noted, TSH stable, he is currently on heparin drip. He has long-standing history of A. fib and noncompliance with medications, Counseled. Cardiology also consulted. Due for Cath 04-21-16.   2. Acute on chronic combined systolic diastolic CHF last EF 33%. Continue Lasix, rate control as above, cardiology following. Salt and fluid restriction. Monitor intake and output and daily weights.  -ve 1.8 Lits ( not accurate)  Filed Weights   04/19/16 0500 04/20/16 0300 04/21/16 0500  Weight: 134.7 kg (297 lb) 135.6 kg (298 lb 15.1 oz) 135.5 kg (298 lb 11.2 oz)    3. Hypertension. For now Lopressor and ACE inhibitor  . Monitor blood pressure last reading was not accurate.  4. Dyslipidemia. On  statin continue.   5. Mild elevation in troponin. Trend is flat and non-ACS pattern, due to demand ischemia from RVR, chest pain-free, continue heparin drip and beta blocker, cardiology to evaluate, echocardiogram reveals severely depressed EF of 15% with diffuse hypokinesis, for left heart catheterization on 04/21/2016.   6. Bilateral foot tinea pedis with superimposed mild bacterial  cellulitis. Placed on Chlortrimazole cream along with oral clindamycin. Monitor.   7. Pre DM . For now  sliding scale, diabetic education.  Lab Results  Component Value Date   HGBA1C 6.2 (H) 04/18/2016   CBG (last 3)   Recent Labs  04/19/16 1245 04/19/16 1745 04/19/16 2121  GLUCAP 161* 92 93      Diet : Diet NPO time specified    Family Communication  :  None  Code Status :  Full  Disposition Plan  :  Home 2-3 days  Consults  :  Cards  Procedures  :    TTE Mild LVH with mild chamber dilatation and LVEF approximately 15%, overall diffuse hypokinesis. Indeterminate diastolic function. Mild mitral regurgitation. Moderate right ventricular enlargement with severely reduced contraction. Mild tricuspid regurgitation with PASP 38 mmHg and elevated CVP. Trivial pericardial effusion.    DVT Prophylaxis  :   Heparin   Lab Results  Component Value Date   PLT 258 04/21/2016    Inpatient Medications  Scheduled Meds: . [MAR Hold] amiodarone  400 mg Oral BID  . [MAR Hold] aspirin EC  81 mg Oral Daily  . [MAR Hold] atorvastatin  80 mg Oral q1800  . [MAR Hold] clindamycin  300 mg Oral Q6H  . [MAR Hold] clotrimazole   Topical BID  . [MAR Hold] furosemide  80 mg Intravenous BID  . [MAR Hold] Influenza vac split quadrivalent PF  0.5 mL Intramuscular Tomorrow-1000  . [MAR Hold] insulin aspart  0-9 Units Subcutaneous TID WC  . [MAR Hold] insulin starter kit- pen needles  1 kit Other Once  . [MAR Hold] insulin starter kit- syringes  1 kit Other Once  . [MAR Hold] metoprolol succinate  50 mg Oral BID   . [MAR Hold] potassium chloride  20 mEq Oral Daily  . [MAR Hold] sacubitril-valsartan  1 tablet Oral BID  . sodium chloride flush  3 mL Intravenous Q12H   Continuous Infusions: . [START ON 04/22/2016] sodium chloride    . heparin 1,700 Units/hr (04/21/16 0400)   PRN Meds:.sodium chloride, [MAR Hold] hydrALAZINE, [DISCONTINUED] ondansetron **OR** [MAR Hold] ondansetron (ZOFRAN) IV, sodium chloride flush  Antibiotics  :    Anti-infectives    Start     Dose/Rate Route Frequency Ordered Stop   04/19/16 0830  [MAR Hold]  clindamycin (CLEOCIN) capsule 300 mg     (MAR Hold since 04/21/16 0838)   300 mg Oral Every 6 hours 04/19/16 0803           Objective:   Vitals:   04/21/16 0400 04/21/16 0500 04/21/16 0738 04/21/16 0849  BP:  (!) 120/92 (!) 125/99   Pulse: 91 93 74   Resp:      Temp:  97.6 F (36.4 C) 98.1 F (36.7 C)   TempSrc:  Oral Oral   SpO2: 91% 93% 94% 98%  Weight:  135.5 kg (298 lb 11.2 oz)    Height:        Wt Readings from Last 3 Encounters:  04/21/16 135.5 kg (298 lb 11.2 oz)  10/04/15 102.2 kg (225 lb 3.2 oz)  09/21/15 100.2 kg (221 lb)     Intake/Output Summary (Last 24 hours) at 04/21/16 0901 Last data filed at 04/21/16 0400  Gross per 24 hour  Intake             1283 ml  Output             1375 ml  Net              -  92 ml     Physical Exam  Awake Alert, Oriented X 3, No new F.N deficits, Normal affect [place].AT,PERRAL Supple Neck,No JVD, No cervical lymphadenopathy appriciated.  Symmetrical Chest wall movement, Good air movement bilaterally, CTAB iRRR,No Gallops,Rubs or new Murmurs, No Parasternal Heave +ve B.Sounds, Abd Soft, No tenderness, No organomegaly appriciated, No rebound - guarding or rigidity. No Cyanosis, Clubbing or edema, No new Rash or bruise, Both feet have chronic tenia pedis       Data Review:    CBC  Recent Labs Lab 04/17/16 1713 04/18/16 0508 04/19/16 0231 04/20/16 0342 04/21/16 0235  WBC 7.6 8.4 8.2 7.9 8.6  HGB  15.2 15.9 15.9 15.2 15.7  HCT 45.4 47.6 47.4 46.0 47.2  PLT 234 245 208 239 258  MCV 84.4 85.0 85.4 84.7 85.4  MCH 28.3 28.4 28.6 28.0 28.4  MCHC 33.5 33.4 33.5 33.0 33.3  RDW 15.3 15.3 15.3 15.5 15.6*  LYMPHSABS  --  3.1  --   --   --   MONOABS  --  0.9  --   --   --   EOSABS  --  0.2  --   --   --   BASOSABS  --  0.0  --   --   --     Chemistries   Recent Labs Lab 04/17/16 1713 04/17/16 2329 04/18/16 0508 04/19/16 0231 04/20/16 0342 04/21/16 0235  NA 137  --  136 136 134* 134*  K 3.9  --  3.4* 4.1 3.8 4.2  CL 100*  --  101 98* 99* 99*  CO2 26  --  '28 26 27 26  ' GLUCOSE 72  --  90 92 102* 85  BUN 18  --  '15 16 19 19  ' CREATININE 1.15  --  1.07 1.13 1.28* 1.20  CALCIUM 8.8*  --  8.3* 8.8* 8.6* 8.7*  MG  --  1.7  --   --  2.2 2.2  AST  --   --  46*  --   --   --   ALT  --   --  36  --   --   --   ALKPHOS  --   --  68  --   --   --   BILITOT  --   --  1.0  --   --   --    ------------------------------------------------------------------------------------------------------------------  Recent Labs  04/18/16 1459  CHOL 130  HDL 35*  LDLCALC 78  TRIG 86  CHOLHDL 3.7    Lab Results  Component Value Date   HGBA1C 6.2 (H) 04/18/2016   ------------------------------------------------------------------------------------------------------------------ No results for input(s): TSH, T4TOTAL, T3FREE, THYROIDAB in the last 72 hours.  Invalid input(s): FREET3 ------------------------------------------------------------------------------------------------------------------ No results for input(s): VITAMINB12, FOLATE, FERRITIN, TIBC, IRON, RETICCTPCT in the last 72 hours.  Coagulation profile  Recent Labs Lab 04/17/16 1713  INR 1.22    No results for input(s): DDIMER in the last 72 hours.  Cardiac Enzymes  Recent Labs Lab 04/17/16 2329 04/18/16 0508 04/18/16 1135  TROPONINI 1.85* 2.10* 1.67*    ------------------------------------------------------------------------------------------------------------------    Component Value Date/Time   BNP 659.8 (H) 04/17/2016 1713    Micro Results Recent Results (from the past 240 hour(s))  MRSA PCR Screening     Status: None   Collection Time: 04/17/16  9:59 PM  Result Value Ref Range Status   MRSA by PCR NEGATIVE NEGATIVE Final    Comment:        The GeneXpert MRSA  Assay (FDA approved for NASAL specimens only), is one component of a comprehensive MRSA colonization surveillance program. It is not intended to diagnose MRSA infection nor to guide or monitor treatment for MRSA infections.     Radiology Reports Dg Chest 2 View  Result Date: 04/17/2016 CLINICAL DATA:  Shortness of breath with exertion. History of atrial fibrillation. EXAM: CHEST  2 VIEW COMPARISON:  PA and lateral chest 04/02/2014 and CT chest 04/03/2014. FINDINGS: There is cardiomegaly and mild interstitial edema. Small left effusion is identified. No consolidative process or pneumothorax. IMPRESSION: Cardiomegaly mild interstitial edema with a small left pleural effusion. Electronically Signed   By: Inge Rise M.D.   On: 04/17/2016 15:20    Time Spent in minutes  30   Kalyse Meharg K M.D on 04/21/2016 at 9:01 AM  Between 7am to 7pm - Pager - [phone]  After 7pm go to [email or web address] - password [facility]  [facility] -  Office  [phone]

## 2016-04-21 NOTE — Consult Note (Addendum)
   CARDIOLOGY CONSULT NOTE  Patient ID: Darius [name] MRN: [id number] DOB/AGE: 05/23/1955 60 y.o.  Admit date: 04/17/2016 Referring physician: [place] Primary Cardiologist:  Reason for Consultation: CHF, atrial fibrillation  HPI: 60 yo with history of chronic primarily diastolic CHF, paroxysmal atrial fibrillation, diabetes, medication noncompliance and lack of insurance was admitted with atrial fibrillation with RVR and acute systolic CHF.  Patient had a successful cardioversion in 5/16 and has not been seen by cardiology since that time.  Echo in 2/16 showed EF 45-50%.  He was no longer taking anticoagulation.  Patient developed dyspnea with minimal exertion over about a week, and his legs and abdomen began to swell.  He developed orthopnea.  He felt palpitations.   At admission, he was noted to be in atrial fibrillation with RVR.  Troponin peaked at 2.1.  No chest pain.  He had mild pulmonary edema on CXR.  Echo was done, showing EF down to 15%.  He has been rate-controlled with Toprol XL.   Echo: EF 15%, mild LV dilation with diffuse hypokinesis, mild MR, moderately dilated RV with severely decreased systolic function.   Review of systems complete and found to be negative unless listed above in HPI  Past Medical History: 1. Atrial fibrillation: Paroxysmal.  He had TEE-DCCV in 2013.  Re-admitted with atrial fibrillation/RVR in 2/16, success DCCV in 5/16.  Admitted with atrial fibrillation/RVR in 3/18.  2. Chronic primarily diastolic CHF: Cardiolite (1/15) with EF 51%, no ischemia or infarction.  Echo (2/16) with EF 45-50%, severe biatrial enlargement.  3. OSA: Uses CPAP.  4. Type II diabetes.   Family History  Problem Relation Age of Onset  . Diabetes Father     died in his 70s  . Hypertension Father     died in her 60s  . Seizures Mother   . Hypertension Sister   . Hypertension Paternal Grandfather   . Heart attack Neg Hx   . Stroke Neg Hx     Social History    Social History  . Marital status: Single    Spouse name: N/A  . Number of children: N/A  . Years of education: N/A   Occupational History  . unemployeed    Social History Main Topics  . Smoking status: Never Smoker  . Smokeless tobacco: Never Used  . Alcohol use Yes     Comment: some  . Drug use: No  . Sexual activity: No   Other Topics Concern  . Not on file   Social History Narrative   Admits that he eats poorly and eats a lot at work.      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     Prescriptions Prior to Admission  Medication Sig Dispense Refill Last Dose  . aspirin EC 325 MG tablet Take 1 tablet (325 mg total) by mouth daily. 90 tablet 3 04/17/2016 at Unknown time  . metFORMIN (GLUCOPHAGE) 1000 MG tablet take 1 tablet by mouth twice a day with food 60 tablet 3 04/17/2016 at Unknown time   Current Scheduled Meds: . amiodarone  400 mg Oral BID  . aspirin EC  81 mg Oral Daily  . atorvastatin  80 mg Oral q1800  . clindamycin  300 mg Oral Q6H  . clotrimazole   Topical BID  . furosemide  80 mg Intravenous BID  . Influenza vac split quadrivalent PF  0.5 mL Intramuscular Tomorrow-1000  . insulin aspart  0-9 Units Subcutaneous TID WC  . insulin starter   kit- pen needles  1 kit Other Once  . insulin starter kit- syringes  1 kit Other Once  . metoprolol succinate  50 mg Oral BID  . potassium chloride  20 mEq Oral Daily  . sacubitril-valsartan  1 tablet Oral BID   Continuous Infusions: . heparin 1,700 Units/hr (04/21/16 0400)   PRN Meds:.hydrALAZINE, [DISCONTINUED] ondansetron **OR** ondansetron (ZOFRAN) IV  Physical exam Blood pressure (!) 125/99, pulse 74, temperature 98.1 F (36.7 C), temperature source Oral, resp. rate 18, height 6' 2" (1.88 m), weight 298 lb 11.2 oz (135.5 kg), SpO2 94 %. General: NAD Neck: JVP 12-14 cm, no thyromegaly or thyroid nodule.  Lungs: Mild crackles at bases. CV: Nondisplaced PMI.  Heart irregular S1/S2, no S3/S4,  no murmur.  1+ edema to knees.  No carotid bruit.  Normal pedal pulses.  Abdomen: Soft, nontender, no hepatosplenomegaly, no distention.  Skin: Erythematous, demarcated rash right > left foot.  Neurologic: Alert and oriented x 3.  Psych: Normal affect. Extremities: No clubbing or cyanosis.  HEENT: Normal.   Labs:   Lab Results  Component Value Date   WBC 8.6 04/21/2016   HGB 15.7 04/21/2016   HCT 47.2 04/21/2016   MCV 85.4 04/21/2016   PLT 258 04/21/2016    Recent Labs Lab 04/18/16 0508  04/21/16 0235  NA 136  < > 134*  K 3.4*  < > 4.2  CL 101  < > 99*  CO2 28  < > 26  BUN 15  < > 19  CREATININE 1.07  < > 1.20  CALCIUM 8.3*  < > 8.7*  PROT 6.1*  --   --   BILITOT 1.0  --   --   ALKPHOS 68  --   --   ALT 36  --   --   AST 46*  --   --   GLUCOSE 90  < > 85  < > = values in this interval not displayed. Lab Results  Component Value Date   CKTOTAL 822 (H) 03/27/2011   CKMB 7.5 (HH) 03/27/2011   TROPONINI 1.67 (HH) 04/18/2016     Radiology: - CXR: Mild pulmonary edema.  EKG: Atrial fibrillation with RVR  ASSESSMENT AND PLAN: 60 yo with history of chronic primarily diastolic CHF, paroxysmal atrial fibrillation, diabetes, medication noncompliance and lack of insurance was admitted with atrial fibrillation with RVR and acute systolic CHF. 1. Acute systolic CHF: EF 15% with RV dysfunction on echo this admission, prior echo in 2016 with EF 45-50%.  Possible tachycardia-mediated cardiomyopathy, we do not know how long he has been in atrial fibrillation.  Also concern for CAD => troponin 2.1, has diabetes.  He is volume overloaded on exam.  - Increase Lasix to 80 mg IV bid.  - Continue Toprol XL for rate control.  - Add Entresto 24/26 bid (has plenty of BP room).  - Will plan right and left heart cath today to rule out CAD as cause of cardiomyopathy.  I discussed risks/benefits of procedure with him and he agrees to proceed.   - Ultimately, will need to get out of atrial  fibrillation.  2. Atrial fibrillation: Admitted with RVR, ?tachy-mediated cardiomyopathy.  Now HR in 90s on Toprol XL 50 mg bid.  Will ultimately need to get out of atrial fibrillation.  - Continue current Toprol XL.  - Continue heparin gtt for now, likely start Eliquis after cath.  After 5 doses of Eliquis, will need TEE-guided DCCV.  - Down the road, would likely   benefit from atrial fibrillation ablation.  Will need to work on getting him on Medicaid.  3. Diabetes: Per primary service.  4. Lack of insurance: Driving noncompliance.  Will have social work and care management help him.  5. Elevated troponin: 2.1 max.  ?Demand ischemia with afib/RVR + volume overload CHF.  Will have cath today to assess for coronary disease.  6. Foot erythema: ?Fungal.  He is on clotrimazole but also covering with clindamycin, per primary team.   Signed: Loralie Champagne 04/21/2016, 8:10 AM

## 2016-04-21 NOTE — Progress Notes (Signed)
ANTICOAGULATION CONSULT NOTE   Pharmacy Consult for Heparin Indication: atrial fibrillation  No Known Allergies  Patient Measurements: Height: 6' 2"  (188 cm) Weight: 298 lb 11.2 oz (135.5 kg) IBW/kg (Calculated) : 82.2 Heparin Dosing Weight: 114kg  Vital Signs: Temp: 97.4 F (36.3 C) (03/12 1228) Temp Source: Oral (03/12 1228) BP: 110/85 (03/12 1228) Pulse Rate: 89 (03/12 1228)  Labs:  Recent Labs  04/19/16 0231 04/19/16 1124 04/20/16 0342 04/21/16 0235  HGB 15.9  --  15.2 15.7  HCT 47.4  --  46.0 47.2  PLT 208  --  239 258  HEPARINUNFRC 0.34 0.44 0.38 0.33  CREATININE 1.13  --  1.28* 1.20    Estimated Creatinine Clearance: 93.4 mL/min (by C-G formula based on SCr of 1.2 mg/dL).   Medical History: Past Medical History:  Diagnosis Date  . Atrial fibrillation ([place])    unable to tolerate versed and fentanyl sedation and required gen anesthesia for TEE;  s/p TEE-DCCV (failed);  Amiodarone started  . Chronic diastolic heart failure ([place])    a. Echo 03/27/11: EF 50-55%, inferior hypokinesis, moderate LAE, mild RVE, normal pulmonary pressures. ;   b.  TEE (04/03/11): EF 40-45%, mild MR moderate LAE, no LAA clot, mild RAE;  c.  Echocardiogram (02/2013): EF 60-65%, Gr 2 DD, mildly dilated Ao root (Ao root dimension 39 mm), MAC, mild LAE, normal RVF  . Diabetes mellitus, type 2 ([place])   . Hx of cardiovascular stress test    Lexiscan Myoview (02/2013): No ischemia or scar, EF 51%, low risk  . Morbid obesity ([place])   . Sleep apnea    a. sleep test 01/2013:  very severy OSA    Medications:  Prescriptions Prior to Admission  Medication Sig Dispense Refill Last Dose  . aspirin EC 325 MG tablet Take 1 tablet (325 mg total) by mouth daily. 90 tablet 3 04/17/2016 at Unknown time  . metFORMIN (GLUCOPHAGE) 1000 MG tablet take 1 tablet by mouth twice a day with food 60 tablet 3 04/17/2016 at Unknown time      Assessment: 53 yom who came to ED 3/8 from PCP's office after being told he  was in A Fib. He was not on any anticoagulation prior to this. CHADS VASc 3(age, CHF, DM).  Baseline CBC is within normal limits and INR 1.22.  Heparin drip 1700 uts/hr HL 0.33 at goal.  No bleeding noted, CBC stable S/p post cath stop heparin begin apixaban.  Goal of Therapy:  Heparin level 0.3-0.7 units/ml Monitor platelets by anticoagulation protocol: Yes   Plan:  Stop heparin  Apixaban 55m BID  - dose ok  LBonnita NasutiPharm.D. CPP, BCPS Clinical Pharmacist [phone]/01/2017 2:25 PM

## 2016-04-22 LAB — POCT I-STAT 3, VENOUS BLOOD GAS (G3P V)
ACID-BASE DEFICIT: 1 mmol/L (ref 0.0–2.0)
ACID-BASE DEFICIT: 3 mmol/L — AB (ref 0.0–2.0)
BICARBONATE: 22.3 mmol/L (ref 20.0–28.0)
BICARBONATE: 24.7 mmol/L (ref 20.0–28.0)
O2 SAT: 59 %
O2 SAT: 59 %
PH VEN: 7.36 (ref 7.250–7.430)
TCO2: 24 mmol/L (ref 0–100)
TCO2: 26 mmol/L (ref 0–100)
pCO2, Ven: 41.3 mmHg — ABNORMAL LOW (ref 44.0–60.0)
pCO2, Ven: 43.8 mmHg — ABNORMAL LOW (ref 44.0–60.0)
pH, Ven: 7.341 (ref 7.250–7.430)
pO2, Ven: 32 mmHg (ref 32.0–45.0)
pO2, Ven: 32 mmHg (ref 32.0–45.0)

## 2016-04-22 LAB — CBC
HCT: 49.4 % (ref 39.0–52.0)
Hemoglobin: 16.3 g/dL (ref 13.0–17.0)
MCH: 28.4 pg (ref 26.0–34.0)
MCHC: 33 g/dL (ref 30.0–36.0)
MCV: 86.1 fL (ref 78.0–100.0)
Platelets: 239 10*3/uL (ref 150–400)
RBC: 5.74 MIL/uL (ref 4.22–5.81)
RDW: 15.7 % — AB (ref 11.5–15.5)
WBC: 10.9 10*3/uL — ABNORMAL HIGH (ref 4.0–10.5)

## 2016-04-22 LAB — GLUCOSE, CAPILLARY
Glucose-Capillary: 86 mg/dL (ref 65–99)
Glucose-Capillary: 130 mg/dL — ABNORMAL HIGH (ref 65–99)
Glucose-Capillary: 82 mg/dL (ref 65–99)
Glucose-Capillary: 106 mg/dL — ABNORMAL HIGH (ref 65–99)

## 2016-04-22 LAB — BASIC METABOLIC PANEL
Anion gap: 8 (ref 5–15)
BUN: 18 mg/dL (ref 6–20)
CALCIUM: 8.8 mg/dL — AB (ref 8.9–10.3)
CO2: 33 mmol/L — ABNORMAL HIGH (ref 22–32)
CREATININE: 1.27 mg/dL — AB (ref 0.61–1.24)
Chloride: 97 mmol/L — ABNORMAL LOW (ref 101–111)
GFR calc non Af Amer: 59 mL/min — ABNORMAL LOW (ref >60)
Glucose, Bld: 74 mg/dL (ref 65–99)
Potassium: 3.8 mmol/L (ref 3.5–5.1)
SODIUM: 138 mmol/L (ref 135–145)

## 2016-04-22 MED ORDER — SACUBITRIL-VALSARTAN 49-51 MG PO TABS
1.0000 | ORAL_TABLET | Freq: Two times a day (BID) | ORAL | Status: DC
  Administered 2016-04-22 – 2016-04-23 (×2): 1 via ORAL
  Filled 2016-04-22 (×2): qty 1

## 2016-04-22 MED ORDER — FUROSEMIDE 40 MG PO TABS
40.0000 mg | ORAL_TABLET | Freq: Every day | ORAL | Status: DC
  Administered 2016-04-23 – 2016-04-24 (×2): 40 mg via ORAL
  Filled 2016-04-22 (×2): qty 1

## 2016-04-22 MED ORDER — ORAL CARE MOUTH RINSE
15.0000 mL | Freq: Two times a day (BID) | OROMUCOSAL | Status: DC
  Administered 2016-04-22 – 2016-04-23 (×4): 15 mL via OROMUCOSAL

## 2016-04-22 NOTE — Progress Notes (Signed)
PROGRESS NOTE                                                                                                                                                                                                             Patient Demographics:    Darius [name], is a 61 y.o. male, DOB - 1955-11-21, TML:[id number]  Admit date - 04/17/2016   Admitting Physician Rise Patience, MD  Outpatient Primary MD for the patient is [name]., DO  LOS - 5  Chief Complaint  Patient presents with  . Atrial Fibrillation       Brief Narrative   Darius [name] is a 61 y.o. male with history of chronic combined systolic and diastolic CHF, atrial fibrillation, diabetes mellitus who has not been taking his cardiac medications for last 2 years presents to the ER because of worsening shortness of breath on exertion with palpitations.  He was found to have A. fib with RVR, severe systolic CHF, left heart cath was unremarkable he now awaits DC cardioversion on Thursday.   Subjective:    Darius [name] today has, No headache, No chest pain, No abdominal pain - No Nausea, No new weakness tingling or numbness, No Cough - Improved SOB.     Assessment  & Plan :     1.Chronic A. fib with RVR. Cardizem drip has been stopped he is currently on Lopressor will try to maintain on that, echocardiogram noted, TSH stable, he is currently on heparin drip. He has long-standing history of A. fib and noncompliance with medications, Counseled. Cardiology Consulted, he underwent left heart cath on 04/21/2016 which was unremarkable for any significant CAD. Cardiology now plans to cardiovert him on 04/24/2016 after 5 doses of Eliquis. Patient initially refused but now is agreeable.   2. Acute on chronic combined systolic diastolic non-ischemic CHF last EF 15%. Continue Lasix, rate control as above, cardiology following. Salt and fluid restriction. Monitor intake  and output and daily weights.  -ve 5.7 Lits ( not accurate first few days not charted correctly)  Filed Weights   04/20/16 0300 04/21/16 0500 04/22/16 0500  Weight: 135.6 kg (298 lb 15.1 oz) 135.5 kg (298 lb 11.2 oz) 131.2 kg (289 lb 4.8 oz)    3. Hypertension. For now Lopressor and ACE inhibitor  . Monitor blood pressure last reading was not accurate.  4. Dyslipidemia. On statin continue.   5. Mild elevation in troponin. Trend is flat and non-ACS pattern, due to demand ischemia from RVR, chest pain-free, continue heparin drip and beta blocker, cardiology to evaluate, echocardiogram reveals severely depressed EF of 15% with diffuse hypokinesis, unremarkable left heart catheterization on 04/21/2016.   6. Bilateral foot tinea pedis with superimposed mild bacterial cellulitis. Placed on Chlortrimazole cream along with oral clindamycin. Monitor.   7. Pre DM . For now  sliding scale, diabetic education.  Lab Results  Component Value Date   HGBA1C 6.2 (H) 04/18/2016   CBG (last 3)   Recent Labs  04/21/16 1646 04/21/16 2139 04/22/16 0817  GLUCAP 106* 107* 86      Diet : Diet heart healthy/carb modified Room service appropriate? Yes; Fluid consistency: Thin    Family Communication  :  None  Code Status :  Full  Disposition Plan  :  Tele  Consults  :  Cards  Procedures  :    TTE Mild LVH with mild chamber dilatation and LVEF approximately 15%, overall diffuse hypokinesis. Indeterminate diastolic function. Mild mitral regurgitation. Moderate right ventricular enlargement with severely reduced contraction. Mild tricuspid regurgitation with PASP 38 mmHg and elevated CVP. Trivial pericardial effusion.  Left Heart Cath -     DVT Prophylaxis  :   Heparin   Lab Results  Component Value Date   PLT 239 04/22/2016    Inpatient Medications  Scheduled Meds: . amiodarone  400 mg Oral BID  . apixaban  5 mg Oral BID  . atorvastatin  80 mg Oral q1800  . clindamycin  300 mg Oral  Q6H  . clotrimazole   Topical BID  . digoxin  0.125 mg Oral Daily  . [START ON 04/23/2016] furosemide  40 mg Oral Daily  . Influenza vac split quadrivalent PF  0.5 mL Intramuscular Tomorrow-1000  . insulin aspart  0-9 Units Subcutaneous TID WC  . insulin starter kit- pen needles  1 kit Other Once  . insulin starter kit- syringes  1 kit Other Once  . mouth rinse  15 mL Mouth Rinse BID  . metoprolol succinate  50 mg Oral BID  . potassium chloride  20 mEq Oral Daily  . sacubitril-valsartan  1 tablet Oral BID  . sodium chloride flush  3 mL Intravenous Q12H   Continuous Infusions:  PRN Meds:.sodium chloride, acetaminophen, hydrALAZINE, [DISCONTINUED] ondansetron **OR** ondansetron (ZOFRAN) IV, ondansetron (ZOFRAN) IV, sodium chloride flush  Antibiotics  :    Anti-infectives    Start     Dose/Rate Route Frequency Ordered Stop   04/19/16 0830  clindamycin (CLEOCIN) capsule 300 mg     300 mg Oral Every 6 hours 04/19/16 0803           Objective:   Vitals:   04/22/16 0500 04/22/16 0811 04/22/16 0922 04/22/16 1100  BP: 103/81 (!) 137/116 (!) 137/116 103/75  Pulse: 91 81 99 88  Resp:      Temp: (!) 96.9 F (36.1 C) 98 F (36.7 C)  98.2 F (36.8 C)  TempSrc: Axillary Oral  Oral  SpO2: 98% 93%  92%  Weight: 131.2 kg (289 lb 4.8 oz)     Height:        Wt Readings from Last 3 Encounters:  04/22/16 131.2 kg (289 lb 4.8 oz)  10/04/15 102.2 kg (225 lb 3.2 oz)  09/21/15 100.2 kg (221 lb)     Intake/Output Summary (Last 24 hours) at 04/22/16 1134 Last data filed at  04/22/16 0500  Gross per 24 hour  Intake              720 ml  Output             4600 ml  Net            -3880 ml     Physical Exam  Awake Alert, Oriented X 3, No new F.N deficits, Normal affect [place].AT,PERRAL Supple Neck,No JVD, No cervical lymphadenopathy appriciated.  Symmetrical Chest wall movement, Good air movement bilaterally, CTAB iRRR,No Gallops,Rubs or new Murmurs, No Parasternal Heave +ve B.Sounds,  Abd Soft, No tenderness, No organomegaly appriciated, No rebound - guarding or rigidity. No Cyanosis, Clubbing or edema, No new Rash or bruise, Both feet have chronic tenia pedis       Data Review:    CBC  Recent Labs Lab 04/18/16 0508 04/19/16 0231 04/20/16 0342 04/21/16 0235 04/22/16 0242  WBC 8.4 8.2 7.9 8.6 10.9*  HGB 15.9 15.9 15.2 15.7 16.3  HCT 47.6 47.4 46.0 47.2 49.4  PLT 245 208 239 258 239  MCV 85.0 85.4 84.7 85.4 86.1  MCH 28.4 28.6 28.0 28.4 28.4  MCHC 33.4 33.5 33.0 33.3 33.0  RDW 15.3 15.3 15.5 15.6* 15.7*  LYMPHSABS 3.1  --   --   --   --   MONOABS 0.9  --   --   --   --   EOSABS 0.2  --   --   --   --   BASOSABS 0.0  --   --   --   --     Chemistries   Recent Labs Lab 04/17/16 2329 04/18/16 0508 04/19/16 0231 04/20/16 0342 04/21/16 0235 04/22/16 0242  NA  --  136 136 134* 134* 138  K  --  3.4* 4.1 3.8 4.2 3.8  CL  --  101 98* 99* 99* 97*  CO2  --  '28 26 27 26 ' 33*  GLUCOSE  --  90 92 102* 85 74  BUN  --  '15 16 19 19 18  ' CREATININE  --  1.07 1.13 1.28* 1.20 1.27*  CALCIUM  --  8.3* 8.8* 8.6* 8.7* 8.8*  MG 1.7  --   --  2.2 2.2  --   AST  --  46*  --   --   --   --   ALT  --  36  --   --   --   --   ALKPHOS  --  68  --   --   --   --   BILITOT  --  1.0  --   --   --   --    ------------------------------------------------------------------------------------------------------------------ No results for input(s): CHOL, HDL, LDLCALC, TRIG, CHOLHDL, LDLDIRECT in the last 72 hours.  Lab Results  Component Value Date   HGBA1C 6.2 (H) 04/18/2016   ------------------------------------------------------------------------------------------------------------------ No results for input(s): TSH, T4TOTAL, T3FREE, THYROIDAB in the last 72 hours.  Invalid input(s): FREET3 ------------------------------------------------------------------------------------------------------------------ No results for input(s): VITAMINB12, FOLATE, FERRITIN, TIBC, IRON,  RETICCTPCT in the last 72 hours.  Coagulation profile  Recent Labs Lab 04/17/16 1713  INR 1.22    No results for input(s): DDIMER in the last 72 hours.  Cardiac Enzymes  Recent Labs Lab 04/17/16 2329 04/18/16 0508 04/18/16 1135  TROPONINI 1.85* 2.10* 1.67*   ------------------------------------------------------------------------------------------------------------------    Component Value Date/Time   BNP 659.8 (H) 04/17/2016 1713    Micro Results Recent Results (from the past 240 hour(s))  MRSA PCR Screening     Status: None   Collection Time: 04/17/16  9:59 PM  Result Value Ref Range Status   MRSA by PCR NEGATIVE NEGATIVE Final    Comment:        The GeneXpert MRSA Assay (FDA approved for NASAL specimens only), is one component of a comprehensive MRSA colonization surveillance program. It is not intended to diagnose MRSA infection nor to guide or monitor treatment for MRSA infections.     Radiology Reports Dg Chest 2 View  Result Date: 04/17/2016 CLINICAL DATA:  Shortness of breath with exertion. History of atrial fibrillation. EXAM: CHEST  2 VIEW COMPARISON:  PA and lateral chest 04/02/2014 and CT chest 04/03/2014. FINDINGS: There is cardiomegaly and mild interstitial edema. Small left effusion is identified. No consolidative process or pneumothorax. IMPRESSION: Cardiomegaly mild interstitial edema with a small left pleural effusion. Electronically Signed   By: Inge Rise M.D.   On: 04/17/2016 15:20    Time Spent in minutes  30   Camellia Popescu K M.D on 04/22/2016 at 11:34 AM  Between 7am to 7pm - Pager - [phone]  After 7pm go to [email or web address] - password [email or web address]  [facility] -  Office  [phone]

## 2016-04-22 NOTE — Consult Note (Deleted)
Advanced Heart Failure Clinic Note   Patient ID: Darius [name] MRN: [id number] DOB/AGE: Feb 24, 1955 61 y.o.  Admit date: 04/17/2016 Referring physician: Domenic Polite Primary Cardiologist: [name] Reason for Consultation: CHF, atrial fibrillation  HPI: 61 yo with history of chronic primarily diastolic CHF, paroxysmal atrial fibrillation, diabetes, medication noncompliance and lack of insurance was admitted with atrial fibrillation with RVR and acute systolic CHF.  Patient had a successful cardioversion in 5/16 and has not been seen by cardiology since that time.  Echo in 2/16 showed EF 45-50%.  He was no longer taking anticoagulation.  Patient developed dyspnea with minimal exertion over about a week, and his legs and abdomen began to swell.  He developed orthopnea.  He felt palpitations.   At admission, he was noted to be in atrial fibrillation with RVR.  Troponin peaked at 2.1.  No chest pain.  He had mild pulmonary edema on CXR.  Echo was done, showing EF down to 15%.  He has been rate-controlled with Toprol XL.   Echo 04/19/2016: EF 15%, mild LV dilation with diffuse hypokinesis, mild MR, moderately dilated RV with severely decreased systolic function.   S/p [facility] 04/21/16 with no significant CAD and elevated filling pressures with low cardiac output.   Feeling somewhat better this am.  Does not feel it when he is in afib.  Breathing stable. No CP, lightheadedness, or dizziness.   Out 3.8 L and down 9 lbs. Creatinine stable.   R[facility] Procedural Findings: Hemodynamics (mmHg) RA mean 14 RV 45/15 PA 50/28, mean 36 PCWP mean 22 LV 104/20 AO 101/75  Oxygen saturations: PA 59% AO 92%  Cardiac Output (Fick) 4.85  Cardiac Index (Fick) 1.89 PVR 2.9 WU  Review of systems complete and found to be negative unless listed in HPI.   Past Medical History: 1. Atrial fibrillation: Paroxysmal.  He had TEE-DCCV in 2013.  Re-admitted with atrial fibrillation/RVR in 2/16, success DCCV in 5/16.   Admitted with atrial fibrillation/RVR in 3/18.  2. Chronic primarily diastolic CHF: Cardiolite (1/15) with EF 51%, no ischemia or infarction.  Echo (2/16) with EF 45-50%, severe biatrial enlargement.  3. OSA: Uses CPAP.  4. Type II diabetes.   Family History  Problem Relation Age of Onset  . Diabetes Father     died in his 78s  . Hypertension Father     died in her 22s  . Seizures Mother   . Hypertension Sister   . Hypertension Paternal Grandfather   . Heart attack Neg Hx   . Stroke Neg Hx     Social History   Social History  . Marital status: Single    Spouse name: N/A  . Number of children: N/A  . Years of education: N/A   Occupational History  . unemployeed    Social History Main Topics  . Smoking status: Never Smoker  . Smokeless tobacco: Never Used  . Alcohol use Yes     Comment: some  . Drug use: No  . Sexual activity: No   Other Topics Concern  . Not on file   Social History Narrative   Admits that he eats poorly and eats a lot at work.      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     Prescriptions Prior to Admission  Medication Sig Dispense Refill Last Dose  . aspirin EC 325 MG tablet Take 1 tablet (325 mg total) by mouth daily. 90 tablet 3 04/17/2016 at Unknown time  . metFORMIN (GLUCOPHAGE) 1000 MG  tablet take 1 tablet by mouth twice a day with food 60 tablet 3 04/17/2016 at Unknown time   Current Scheduled Meds: . amiodarone  400 mg Oral BID  . apixaban  5 mg Oral BID  . atorvastatin  80 mg Oral q1800  . clindamycin  300 mg Oral Q6H  . clotrimazole   Topical BID  . digoxin  0.125 mg Oral Daily  . furosemide  80 mg Intravenous BID  . Influenza vac split quadrivalent PF  0.5 mL Intramuscular Tomorrow-1000  . insulin aspart  0-9 Units Subcutaneous TID WC  . insulin starter kit- pen needles  1 kit Other Once  . insulin starter kit- syringes  1 kit Other Once  . mouth rinse  15 mL Mouth Rinse BID  . metoprolol succinate  50 mg  Oral BID  . potassium chloride  20 mEq Oral Daily  . sacubitril-valsartan  1 tablet Oral BID  . sodium chloride flush  3 mL Intravenous Q12H   Continuous Infusions:  PRN Meds:.sodium chloride, acetaminophen, hydrALAZINE, [DISCONTINUED] ondansetron **OR** ondansetron (ZOFRAN) IV, ondansetron (ZOFRAN) IV, sodium chloride flush  Physical exam Blood pressure (!) 137/116, pulse 81, temperature 98 F (36.7 C), temperature source Oral, resp. rate 20, height 6' 2" (1.88 m), weight 289 lb 4.8 oz (131.2 kg), SpO2 93 %.   General: NAD Neck: JVP 8-9 cm. No thyromegaly or thyroid nodule.  Lungs: Slightly diminished basilar sounds.  CV: Nondisplaced PMI.  Heart irregular S1/S2, no S3/S4, no murmur.  Trace to 1+ ankle edema. No carotid bruit.  Normal pedal pulses.  Abdomen: Soft, NT, ND, no HSM. No bruits or masses. +BS   Skin: Erythematous, demarcated rash right > left foot.  Neurologic: Alert and oriented x 3.  Psych: Normal affect. Extremities: No clubbing or cyanosis.  HEENT: Normal.   Telemetry: Personally reviewed, Afib rates in 80-90s.  Labs:   Lab Results  Component Value Date   WBC 10.9 (H) 04/22/2016   HGB 16.3 04/22/2016   HCT 49.4 04/22/2016   MCV 86.1 04/22/2016   PLT 239 04/22/2016    Recent Labs Lab 04/18/16 0508  04/22/16 0242  NA 136  < > 138  K 3.4*  < > 3.8  CL 101  < > 97*  CO2 28  < > 33*  BUN 15  < > 18  CREATININE 1.07  < > 1.27*  CALCIUM 8.3*  < > 8.8*  PROT 6.1*  --   --   BILITOT 1.0  --   --   ALKPHOS 68  --   --   ALT 36  --   --   AST 46*  --   --   GLUCOSE 90  < > 74  < > = values in this interval not displayed. Lab Results  Component Value Date   CKTOTAL 822 (H) 03/27/2011   CKMB 7.5 (HH) 03/27/2011   TROPONINI 1.67 (HH) 04/18/2016     Radiology: - CXR: Mild pulmonary edema.  EKG: 04/18/16 Afib RVR   ASSESSMENT AND PLAN: 60 yo with history of chronic primarily diastolic CHF, paroxysmal atrial fibrillation, diabetes, medication  noncompliance and lack of insurance was admitted with atrial fibrillation with RVR and acute systolic CHF. 1. Acute systolic CHF: EF 15% with RV dysfunction on echo this admission, prior echo in 2016 with EF 45-50%.  Possible tachycardia-mediated cardiomyopathy, we do not know how long he has been in atrial fibrillation.  Also concern for CAD => troponin 2.1, has diabetes.   -   Volume status improved but still at least mildly volume overloaded.  - Got lasix 80 mg IV this am.  Follow response. May be able to switch to po tonight vs tomorrow.  Was not on diuretics prior to admission.  Possibly lasix 40 mg daily to start.   - Continue Toprol XL for rate control.  - Increase Entresto to 49/51 mg BID.   - Ultimately, will need to get out of atrial fibrillation as below.  2. Atrial fibrillation: Admitted with RVR, ?tachy-mediated cardiomyopathy.  - HR now in 80-90s on Toprol XL 50 mg bid.  - Will plan TEE/DCCV after Eliquis loaded. Earliest would be Thursday morning.   - Could eventually benefit from Afib ablation.  Will need insurance -> Medicaid.  3. Diabetes: - Per primary.  4. Lack of insurance:  - Main source of his non-compliance per patient. Social work and care management following.  5. Elevated troponin - 2.1 max and trended down.  Likely demand ischemia with no CAD on cath. 6. Foot erythema:  ?Fungal.  He is on clotrimazole but also covering with clindamycin, per primary team. No change to current plan.   Signed: Isacc Andrew , PA-C  04/22/2016, 9:01 AM  Advanced Heart Failure Team Pager [phone] (M-F; 7a - 4p)  Please contact [facility] for night-coverage after hours (4p -7a ) and weekends on [email or web address]      

## 2016-04-22 NOTE — Progress Notes (Signed)
New pt admission from 4N. Pt brought to the floor in stable condition. Vitals taken. Initial Assessment done. All immediate pertinent needs to patient addressed. Patient Guide given to patient. Important safety instructions relating to hospitalization reviewed with patient. Patient verbalized understanding. Will continue to monitor pt.  Clarise Cruz RN

## 2016-04-22 NOTE — Progress Notes (Signed)
Advanced Heart Failure Clinic Note   Patient ID: Darius [name] MRN: [id number] DOB/AGE: [birth year] 61 y.o.   Admit date: 04/17/2016 Referring physician: Domenic Polite Primary Cardiologist: [name] Reason for Consultation: CHF, atrial fibrillation  HPI:  Echo 04/19/2016: EF 15%, mild LV dilation with diffuse hypokinesis, mild MR, moderately dilated RV with severely decreased systolic function.   S/p [facility] 04/21/16 with no significant CAD and elevated filling pressures with low cardiac output.   Feeling somewhat better this am.  Denies palpitations. Breathing stable. No CP, lightheadedness, or dizziness.   Out 3.8 L and down 9 lbs. Creatinine stable.   LHC/RHC Procedural Findings: No significant coronary disease Hemodynamics (mmHg) RA mean 14 RV 45/15 PA 50/28, mean 36 PCWP mean 22 LV 104/20 AO 101/75 Oxygen saturations: PA 59% AO 92% Cardiac Output (Fick) 4.85  Cardiac Index (Fick) 1.89 PVR 2.9 WU  Review of systems complete and found to be negative unless listed in HPI.   Past Medical History: 1. Atrial fibrillation: Paroxysmal.  Darius [name] had TEE-DCCV in 2013.  Re-admitted with atrial fibrillation/RVR in 2/16, success DCCV in 5/16.  Admitted with atrial fibrillation/RVR in 3/18.  2. Chronic primarily diastolic CHF: Cardiolite (1/15) with EF 51%, no ischemia or infarction.  Echo (2/16) with EF 45-50%, severe biatrial enlargement.  3. OSA: Uses CPAP.  4. Type II diabetes.   Family History  Problem Relation Age of Onset  . Diabetes Father     died in his 67s  . Hypertension Father     died in her 71s  . Seizures Mother   . Hypertension Sister   . Hypertension Paternal Grandfather   . Heart attack Neg Hx   . Stroke Neg Hx    Social History   Social History  . Marital status: Single    Spouse name: N/A  . Number of children: N/A  . Years of education: N/A   Occupational History  . unemployeed    Social History Main Topics  . Smoking status: Never Smoker  . Smokeless  tobacco: Never Used  . Alcohol use Yes     Comment: some  . Drug use: No  . Sexual activity: No   Other Topics Concern  . Not on file   Social History Narrative   Admits that Darius [name] eats poorly and eats a lot at work.      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    Meds PTA Current Meds  Medication Sig  . aspirin EC 325 MG tablet Take 1 tablet (325 mg total) by mouth daily.  . metFORMIN (GLUCOPHAGE) 1000 MG tablet take 1 tablet by mouth twice a day with food    Current Scheduled Meds . amiodarone  400 mg Oral BID  . apixaban  5 mg Oral BID  . atorvastatin  80 mg Oral q1800  . clindamycin  300 mg Oral Q6H  . clotrimazole   Topical BID  . digoxin  0.125 mg Oral Daily  . furosemide  80 mg Intravenous BID  . Influenza vac split quadrivalent PF  0.5 mL Intramuscular Tomorrow-1000  . insulin aspart  0-9 Units Subcutaneous TID WC  . insulin starter kit- pen needles  1 kit Other Once  . insulin starter kit- syringes  1 kit Other Once  . mouth rinse  15 mL Mouth Rinse BID  . metoprolol succinate  50 mg Oral BID  . potassium chloride  20 mEq Oral Daily  . sacubitril-valsartan  1 tablet Oral BID  . sodium chloride  flush  3 mL Intravenous Q12H   Continuous infusions   PRN meds sodium chloride, acetaminophen, hydrALAZINE, [DISCONTINUED] ondansetron **OR** ondansetron (ZOFRAN) IV, ondansetron (ZOFRAN) IV, sodium chloride flush    Vitals:   04/22/16 0811 04/22/16 0922  BP: (!) 137/116 (!) 137/116  Pulse: 81 99  Resp:    Temp: 98 F (36.7 C)     Wt Readings from Last 3 Encounters:  04/22/16 289 lb 4.8 oz (131.2 kg)  10/04/15 225 lb 3.2 oz (102.2 kg)  09/21/15 221 lb (100.2 kg)    Physical Exam General: NAD Neck: JVP 7-8 cm. No thyromegaly or thyroid nodule.  Lungs: Slightly diminished basilar sounds.  CV: Nondisplaced PMI.  Heart irregular S1/S2, no S3/S4, no murmur.  1+ ankle edema. No carotid bruit.  Normal pedal pulses.  Abdomen: Soft, NT, ND, no  HSM. No bruits or masses. +BS   Skin: Erythematous, demarcated rash right > left foot.  Neurologic: Alert and oriented x 3.  Psych: Normal affect. Extremities: No clubbing or cyanosis.  HEENT: Normal.   Telemetry: Personally reviewed, Afib rates in 80-90s.  Labs: CBC  Recent Labs  04/21/16 0235 04/22/16 0242  WBC 8.6 10.9*  HGB 15.7 16.3  HCT 47.2 49.4  MCV 85.4 86.1  PLT 258 773   Basic Metabolic Panel  Recent Labs  04/20/16 0342 04/21/16 0235 04/22/16 0242  NA 134* 134* 138  K 3.8 4.2 3.8  CL 99* 99* 97*  CO2 27 26 33*  GLUCOSE 102* 85 74  BUN '19 19 18  ' CREATININE 1.28* 1.20 1.27*  CALCIUM 8.6* 8.7* 8.8*  MG 2.2 2.2  --    Radiology: - CXR: Mild pulmonary edema.  EKG: 04/18/16 Afib RVR   ASSESSMENT AND PLAN:  61 yo with history of chronic primarily diastolic CHF, paroxysmal atrial fibrillation, diabetes, medication noncompliance and lack of insurance was admitted with atrial fibrillation with RVR and acute systolic CHF.  1. Acute systolic CHF: EF 73% with RV dysfunction on echo this admission, prior echo in 2016 with EF 45-50%.  Possible tachycardia-mediated cardiomyopathy, we do not know how long Darius [name] has been in atrial fibrillation.  No significant CAD on 3/12 cath. Volume status improved.  - Got IV Lasix this morning, transition to Lasix 40 mg po daily tomorrow.  - Continue Toprol XL for rate control.  - Increase Entresto to 49/51 mg BID.   - Ultimately, will need to get out of atrial fibrillation as below.  2. Atrial fibrillation: Admitted with RVR, ?tachy-mediated cardiomyopathy.  HR now in 80-90s on Toprol XL 50 mg bid.  - Continue to load amiodarone to maintain NSR.  - Will plan TEE/DCCV after Eliquis loaded. Earliest would be Thursday morning.   - Could eventually benefit from Afib ablation.  Will need insurance -> Medicaid.  3. Diabetes: - Per primary.  4. Lack of insurance:  - Main source of his non-compliance per patient. Social work and Systems developer following.  5. Elevated troponin: 2.1 max and trended down.  Likely demand ischemia with no CAD on cath. 6. Foot erythema: ?Fungal.  Darius [name] is on clotrimazole but also covering with clindamycin, per primary team. No change to current plan.  7. Disposition: Would be ideal to keep him in-house until Thursday am then do TEE-DCCV (after 5th dose of Eliquis).  However, apparently Darius [name] has a cat stuck in his house who has to be fed and Darius [name] really needs to go home today.  If Darius [name] goes home today, will need to  have TEE-DCCV arranged for Thursday or Friday.  Cardiac meds for home (will need to make sure that Darius [name] can get all these): apixaban 5 mg bid, amiodarone 400 mg bid x 5 more days then 200 mg bid x 7 days then 200 mg daily, Entresto 49/51 bid, Toprol XL 50 bid, digoxin 0.125 daily, atorvastatin 20 daily, Lasix 40 mg daily, KCl 20 mEq daily.   Signed:  Candon Caras" [place], PA-C 04/22/2016 9:52 AM   Advanced Heart Failure Team Pager [phone] (M-F; 7a - 4p)  Please contact [facility] for night-coverage after hours (4p -7a ) and weekends on [email or web address]  Patient seen with PA, agree with the above note.  Volume status looks much better, tolerating meds.  Remains in atrial fibrillation but rate-controlled. 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  Darius [name] needs to get back in NSR.  However, cannot do TEE-guided DCCV until after 5th dose of Eliquis, which would be Thursday.   Darius [name] says that Darius [name] has to get home to take care of his cats.  Ideally, would just stay until Thursday and then do TEE-DCCV with discharge Thursday pm.  However, if Darius [name] has to go home today, will need to make care management arrangements to get his meds (has no insurance) and will need arrangements for TEE-DCCV on Thursday or Friday as an outpatient.  Will need followup in CHF clinic in 10 days.   Loralie Champagne 04/22/2016 10:56 AM

## 2016-04-23 DIAGNOSIS — I509 Heart failure, unspecified: Secondary | ICD-10-CM

## 2016-04-23 DIAGNOSIS — I5023 Acute on chronic systolic (congestive) heart failure: Secondary | ICD-10-CM

## 2016-04-23 LAB — BASIC METABOLIC PANEL
ANION GAP: 5 (ref 5–15)
BUN: 14 mg/dL (ref 6–20)
CALCIUM: 8.7 mg/dL — AB (ref 8.9–10.3)
CO2: 32 mmol/L (ref 22–32)
Chloride: 101 mmol/L (ref 101–111)
Creatinine, Ser: 1.3 mg/dL — ABNORMAL HIGH (ref 0.61–1.24)
GFR calc Af Amer: >60 mL/min (ref >60)
GFR calc non Af Amer: 57 mL/min — ABNORMAL LOW (ref >60)
Glucose, Bld: 113 mg/dL — ABNORMAL HIGH (ref 65–99)
Potassium: 4.2 mmol/L (ref 3.5–5.1)
SODIUM: 138 mmol/L (ref 135–145)

## 2016-04-23 LAB — GLUCOSE, CAPILLARY
GLUCOSE-CAPILLARY: 163 mg/dL — AB (ref 65–99)
Glucose-Capillary: 97 mg/dL (ref 65–99)
Glucose-Capillary: 80 mg/dL (ref 65–99)
Glucose-Capillary: 105 mg/dL — ABNORMAL HIGH (ref 65–99)

## 2016-04-23 LAB — CBC
HEMATOCRIT: 50.8 % (ref 39.0–52.0)
HEMOGLOBIN: 16.6 g/dL (ref 13.0–17.0)
MCH: 28.2 pg (ref 26.0–34.0)
MCHC: 32.7 g/dL (ref 30.0–36.0)
MCV: 86.4 fL (ref 78.0–100.0)
Platelets: 246 10*3/uL (ref 150–400)
RBC: 5.88 MIL/uL — ABNORMAL HIGH (ref 4.22–5.81)
RDW: 15.6 % — AB (ref 11.5–15.5)
WBC: 8.8 10*3/uL (ref 4.0–10.5)

## 2016-04-23 MED ORDER — SODIUM CHLORIDE 0.9% FLUSH: 3.0000 mL | INTRAVENOUS | Status: DC | PRN

## 2016-04-23 MED ORDER — SODIUM CHLORIDE 0.9 % IV SOLN: 250.0000 mL | INTRAVENOUS | Status: DC

## 2016-04-23 MED ORDER — SACUBITRIL-VALSARTAN 24-26 MG PO TABS
1.0000 | ORAL_TABLET | Freq: Two times a day (BID) | ORAL | Status: DC
  Administered 2016-04-23: 1 via ORAL
  Filled 2016-04-23 (×2): qty 1

## 2016-04-23 MED ORDER — SODIUM CHLORIDE 0.9% FLUSH
3.0000 mL | Freq: Two times a day (BID) | INTRAVENOUS | Status: DC
  Administered 2016-04-23 (×2): 3 mL via INTRAVENOUS

## 2016-04-23 MED ORDER — SACUBITRIL-VALSARTAN 24-26 MG PO TABS: 1.0000 | ORAL_TABLET | Freq: Two times a day (BID) | ORAL | Status: DC

## 2016-04-23 NOTE — Progress Notes (Signed)
Spoke with [place], [place] in regards to pt BP of 93/74. At this time orders to hold metoprolol 50mg .

## 2016-04-23 NOTE — Progress Notes (Signed)
[facility]                                                                              Patient Demographics  Darius [name], is a 61 y.o. male, DOB - April 11, 1955, IHD:[id number]  Admit date - 04/17/2016   Admitting Physician Rise Patience, MD  Outpatient Primary MD for the patient is [name]., DO  Outpatient specialists:   LOS - 6  days    Chief Complaint  Patient presents with  . Atrial Fibrillation       Brief summary   Darius [name] a 61 y.o.malewith history of chronic combined systolic and diastolic CHF, atrial fibrillation, diabetes mellitus who has not been taking his cardiac medications for last 2 years presents to the ER because of worsening shortness of breath on exertion with palpitations. He was found to have A. fib with RVR, severe systolic CHF, left heart cath was unremarkable he now awaits DC cardioversion on Thursday.   Assessment & Plan    Principal problem Chronic A. fib with RVR., Tachycardia mediated cardiomyopathy -  Patient was placed on Cardizem drip, which has been currently weaned off  - TSH stable - Cardiology following, patient loaded on amiodarone  - Also started on Eliquis, plan on cardioversion on 3/15.  Active problems  Acute on chronic combined systolic diastolic non-ischemic CHF last EF 15%.  - 2-D echo showed EF of 62%, systolic function severely reduced, diffuse hypokinesis - Continue Lasix, transitioned to oral Lasix, continue Toprol-XL for rate control.  - Continue strict I's and O's and daily weights    Hypertension.  - Continue Lopressor and ACE inhibitor   Dyslipidemia.  -  On statin continue.   Mild elevation in troponin. -  Trend is flat and non-ACS pattern, due to demand ischemia from RVR, chest pain-free,  - continue heparin drip and beta blocker -  Echo showed severely depressed EF of 15% with diffuse hypokinesis, unremarkable left heart catheterization on 04/21/2016.   Bilateral  foot tinea pedis with superimposed mild bacterial cellulitis. -  Placed on Chlortrimazole cream along with oral clindamycin  Prediabetes -  sliding scale, diabetic education.  Code Status: full  DVT Prophylaxis: eliquis  Family Communication: Discussed in detail with the patient, all imaging results, lab results explained to the patient    Disposition Plan: 3/15  Time Spent in minutes   25 minutes  Procedures:  Cardiac cath  Consultants:   Cardiology  Antimicrobials:      Medications  Scheduled Meds: . amiodarone  400 mg Oral BID  . apixaban  5 mg Oral BID  . atorvastatin  80 mg Oral q1800  . clindamycin  300 mg Oral Q6H  . clotrimazole   Topical BID  . digoxin  0.125 mg Oral Daily  . furosemide  40 mg Oral Daily  . Influenza vac split quadrivalent PF  0.5 mL Intramuscular Tomorrow-1000  . insulin aspart  0-9 Units Subcutaneous TID WC  . insulin starter kit- pen needles  1 kit Other Once  . insulin starter kit- syringes  1 kit Other Once  . mouth rinse  15 mL Mouth Rinse  BID  . metoprolol succinate  50 mg Oral BID  . potassium chloride  20 mEq Oral Daily  . sacubitril-valsartan  1 tablet Oral BID  . sodium chloride flush  3 mL Intravenous Q12H  . sodium chloride flush  3 mL Intravenous Q12H   Continuous Infusions: . sodium chloride     PRN Meds:.sodium chloride, acetaminophen, hydrALAZINE, [DISCONTINUED] ondansetron **OR** ondansetron (ZOFRAN) IV, ondansetron (ZOFRAN) IV, sodium chloride flush, sodium chloride flush   Antibiotics   Anti-infectives    Start     Dose/Rate Route Frequency Ordered Stop   04/19/16 0830  clindamycin (CLEOCIN) capsule 300 mg     300 mg Oral Every 6 hours 04/19/16 0803          Subjective:   Darius [name] was seen and examined today.  Denies any specific complaints, no chest pain or shortness of breath. Patient denies dizziness,  abdominal pain, N/V/D/C, new weakness, numbess, tingling. No acute events overnight.     Objective:   Vitals:   04/23/16 0453 04/23/16 0950 04/23/16 1037 04/23/16 1219  BP: (!) 95/58 93/74  112/80  Pulse: 77  71 70  Resp: '20  18 18  ' Temp: 97.9 F (36.6 C)  97.8 F (36.6 C) 98.4 F (36.9 C)  TempSrc: Oral  Oral Oral  SpO2: 96%  97% 96%  Weight:      Height:        Intake/Output Summary (Last 24 hours) at 04/23/16 1415 Last data filed at 04/23/16 1300  Gross per 24 hour  Intake              940 ml  Output              925 ml  Net               15 ml     Wt Readings from Last 3 Encounters:  04/23/16 128.5 kg (283 lb 6.4 oz)  10/04/15 102.2 kg (225 lb 3.2 oz)  09/21/15 100.2 kg (221 lb)     Exam  General: Alert and oriented x 3, NAD  HEENT:    Neck: Supple, + JVD  Cardiovascular: S1 S2 auscultated, no rubs, murmurs or gallops. Regular rate and rhythm.  Respiratory: Decreased breath sounds at the bases  Gastrointestinal: Soft, nontender, nondistended, + bowel sounds  Ext: no cyanosis clubbing, trace edema  Neuro: AAOx3, Cr N's II- XII. Strength 5/5 upper and lower extremities bilaterally  Skin: No rashes  Psych: Normal affect and demeanor, alert and oriented x3    Data Reviewed:  I have personally reviewed following labs and imaging studies  Micro Results Recent Results (from the past 240 hour(s))  MRSA PCR Screening     Status: None   Collection Time: 04/17/16  9:59 PM  Result Value Ref Range Status   MRSA by PCR NEGATIVE NEGATIVE Final    Comment:        The GeneXpert MRSA Assay (FDA approved for NASAL specimens only), is one component of a comprehensive MRSA colonization surveillance program. It is not intended to diagnose MRSA infection nor to guide or monitor treatment for MRSA infections.     Radiology Reports Dg Chest 2 View  Result Date: 04/17/2016 CLINICAL DATA:  Shortness of breath with exertion. History of atrial fibrillation. EXAM: CHEST  2 VIEW COMPARISON:  PA and lateral chest 04/02/2014 and CT chest 04/03/2014.  FINDINGS: There is cardiomegaly and mild interstitial edema. Small left effusion is identified. No consolidative process or pneumothorax.  IMPRESSION: Cardiomegaly mild interstitial edema with a small left pleural effusion. Electronically Signed   By: Inge Rise M.D.   On: 04/17/2016 15:20    Lab Data:  CBC:  Recent Labs Lab 04/18/16 0508 04/19/16 0231 04/20/16 0342 04/21/16 0235 04/22/16 0242 04/23/16 0420  WBC 8.4 8.2 7.9 8.6 10.9* 8.8  NEUTROABS 4.2  --   --   --   --   --   HGB 15.9 15.9 15.2 15.7 16.3 16.6  HCT 47.6 47.4 46.0 47.2 49.4 50.8  MCV 85.0 85.4 84.7 85.4 86.1 86.4  PLT 245 208 239 258 239 329   Basic Metabolic Panel:  Recent Labs Lab 04/17/16 2329  04/19/16 0231 04/20/16 0342 04/21/16 0235 04/22/16 0242 04/23/16 0420  NA  --   < > 136 134* 134* 138 138  K  --   < > 4.1 3.8 4.2 3.8 4.2  CL  --   < > 98* 99* 99* 97* 101  CO2  --   < > '26 27 26 ' 33* 32  GLUCOSE  --   < > 92 102* 85 74 113*  BUN  --   < > '16 19 19 18 14  ' CREATININE  --   < > 1.13 1.28* 1.20 1.27* 1.30*  CALCIUM  --   < > 8.8* 8.6* 8.7* 8.8* 8.7*  MG 1.7  --   --  2.2 2.2  --   --   < > = values in this interval not displayed. GFR: Estimated Creatinine Clearance: 83.9 mL/min (by C-G formula based on SCr of 1.3 mg/dL (H)). Liver Function Tests:  Recent Labs Lab 04/18/16 0508  AST 46*  ALT 36  ALKPHOS 68  BILITOT 1.0  PROT 6.1*  ALBUMIN 3.2*   No results for input(s): LIPASE, AMYLASE in the last 168 hours. No results for input(s): AMMONIA in the last 168 hours. Coagulation Profile:  Recent Labs Lab 04/17/16 1713  INR 1.22   Cardiac Enzymes:  Recent Labs Lab 04/17/16 2329 04/18/16 0508 04/18/16 1135  TROPONINI 1.85* 2.10* 1.67*   BNP (last 3 results) No results for input(s): PROBNP in the last 8760 hours. HbA1C: No results for input(s): HGBA1C in the last 72 hours. CBG:  Recent Labs Lab 04/22/16 1120 04/22/16 1650 04/22/16 2303 04/23/16 0748  04/23/16 1139  GLUCAP 130* 82 106* 97 80   Lipid Profile: No results for input(s): CHOL, HDL, LDLCALC, TRIG, CHOLHDL, LDLDIRECT in the last 72 hours. Thyroid Function Tests: No results for input(s): TSH, T4TOTAL, FREET4, T3FREE, THYROIDAB in the last 72 hours. Anemia Panel: No results for input(s): VITAMINB12, FOLATE, FERRITIN, TIBC, IRON, RETICCTPCT in the last 72 hours. Urine analysis:    Component Value Date/Time   COLORURINE YELLOW 09/19/2015 0420   APPEARANCEUR CLEAR 09/19/2015 0420   LABSPEC 1.030 09/19/2015 0420   PHURINE 5.5 09/19/2015 0420   GLUCOSEU >1000 (A) 09/19/2015 0420   HGBUR NEGATIVE 09/19/2015 0420   BILIRUBINUR NEGATIVE 09/19/2015 0420   KETONESUR >80 (A) 09/19/2015 0420   PROTEINUR NEGATIVE 09/19/2015 0420   UROBILINOGEN 1.0 03/26/2011 2017   NITRITE NEGATIVE 09/19/2015 0420   LEUKOCYTESUR NEGATIVE 09/19/2015 0420     Kenichi Cassada M.D. [facility] 04/23/2016, 2:15 PM  Pager: [phone] Between 7am to 7pm - call Pager - [phone]  After 7pm go to [email or web address] - password [email or web address]  Call night coverage person covering after 7pm

## 2016-04-23 NOTE — Progress Notes (Signed)
Advanced Heart Failure Rounding Note   Patient ID: Darius [name] MRN: [id number] DOB/AGE: 08/14/1955 61 y.o.   Admit date: 04/17/2016 Referring physician: Domenic Polite Primary Cardiologist: [name] Reason for Consultation: CHF, atrial fibrillation  HPI:  Echo 04/19/2016: EF 15%, mild LV dilation with diffuse hypokinesis, mild MR, moderately dilated RV with severely decreased systolic function.   S/p [facility] 04/21/16 with no significant CAD and elevated filling pressures with low cardiac output.   Feeling OK this morning. Has neighbor looking after cats. Denies lightheadedness or dizziness. No chest pain or palpitations. Denies fatigue.   Out 1.5 L and down 2 lbs.    LHC/RHC Procedural Findings: No significant coronary disease Hemodynamics (mmHg) RA mean 14 RV 45/15 PA 50/28, mean 36 PCWP mean 22 LV 104/20 AO 101/75 Oxygen saturations: PA 59% AO 92% Cardiac Output (Fick) 4.85  Cardiac Index (Fick) 1.89 PVR 2.9 WU  Review of systems complete and found to be negative unless listed in HPI.   Past Medical History: 1. Atrial fibrillation: Paroxysmal.  He had TEE-DCCV in 2013.  Re-admitted with atrial fibrillation/RVR in 2/16, success DCCV in 5/16.  Admitted with atrial fibrillation/RVR in 3/18.  2. Chronic primarily diastolic CHF: Cardiolite (1/15) with EF 51%, no ischemia or infarction.  Echo (2/16) with EF 45-50%, severe biatrial enlargement.  3. OSA: Uses CPAP.  4. Type II diabetes.   Family History  Problem Relation Age of Onset  . Diabetes Father     died in his 8s  . Hypertension Father     died in her 4s  . Seizures Mother   . Hypertension Sister   . Hypertension Paternal Grandfather   . Heart attack Neg Hx   . Stroke Neg Hx    Social History   Social History  . Marital status: Single    Spouse name: N/A  . Number of children: N/A  . Years of education: N/A   Occupational History  . unemployeed    Social History Main Topics  . Smoking status: Never  Smoker  . Smokeless tobacco: Never Used  . Alcohol use Yes     Comment: some  . Drug use: No  . Sexual activity: No   Other Topics Concern  . Not on file   Social History Narrative   Admits that he eats poorly and eats a lot at work.      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    Meds PTA Current Meds  Medication Sig  . aspirin EC 325 MG tablet Take 1 tablet (325 mg total) by mouth daily.  . metFORMIN (GLUCOPHAGE) 1000 MG tablet take 1 tablet by mouth twice a day with food    Current Scheduled Meds . amiodarone  400 mg Oral BID  . apixaban  5 mg Oral BID  . atorvastatin  80 mg Oral q1800  . clindamycin  300 mg Oral Q6H  . clotrimazole   Topical BID  . digoxin  0.125 mg Oral Daily  . furosemide  40 mg Oral Daily  . Influenza vac split quadrivalent PF  0.5 mL Intramuscular Tomorrow-1000  . insulin aspart  0-9 Units Subcutaneous TID WC  . insulin starter kit- pen needles  1 kit Other Once  . insulin starter kit- syringes  1 kit Other Once  . mouth rinse  15 mL Mouth Rinse BID  . metoprolol succinate  50 mg Oral BID  . potassium chloride  20 mEq Oral Daily  . sacubitril-valsartan  1 tablet Oral BID  .  sodium chloride flush  3 mL Intravenous Q12H   Continuous infusions   PRN meds sodium chloride, acetaminophen, hydrALAZINE, [DISCONTINUED] ondansetron **OR** ondansetron (ZOFRAN) IV, ondansetron (ZOFRAN) IV, sodium chloride flush    Vitals:   04/23/16 0453 04/23/16 0950  BP: (!) 95/58 93/74  Pulse: 77   Resp: 20   Temp: 97.9 F (36.6 C)     Wt Readings from Last 3 Encounters:  04/23/16 283 lb 6.4 oz (128.5 kg)  10/04/15 225 lb 3.2 oz (102.2 kg)  09/21/15 221 lb (100.2 kg)    Physical Exam General: NAD, seated in recliner.  Neck: JVP 7-8 cm. No thyromegaly or thyroid nodule.  Lungs: Slight decreased basilar sounds.   CV: Nondisplaced PMI.  Heart irregular S1/S2, no S3/S4, no murmur.  Trace to 1+ edema 1/3 way to knee. No carotid bruit.   Normal pedal pulses.  Abdomen: Soft, NT, ND, no HSM. No bruits or masses. +BS   Skin: Erythematous, demarcated rash right > left foot.  Neurologic: Alert and oriented x 3.  Psych: Normal affect. Extremities: No clubbing or cyanosis.  HEENT: Normal.   Telemetry: Personally reviewed, Afib 80-90s   Labs: CBC  Recent Labs  04/22/16 0242 04/23/16 0420  WBC 10.9* 8.8  HGB 16.3 16.6  HCT 49.4 50.8  MCV 86.1 86.4  PLT 239 929   Basic Metabolic Panel  Recent Labs  04/21/16 0235 04/22/16 0242 04/23/16 0420  NA 134* 138 138  K 4.2 3.8 4.2  CL 99* 97* 101  CO2 26 33* 32  GLUCOSE 85 74 113*  BUN _0 CREATININE 1.20 1.27* 1.30*  CALCIUM 8.7* 8.8* 8.7*  MG 2.2  --   --    Radiology: - CXR: Mild pulmonary edema.  EKG: 04/18/16 Afib RVR   ASSESSMENT AND PLAN:  61 yo with history of chronic primarily diastolic CHF, paroxysmal atrial fibrillation, diabetes, medication noncompliance and lack of insurance was admitted with atrial fibrillation with RVR and acute systolic CHF.  1. Acute systolic CHF: EF 24% with RV dysfunction on echo this admission, prior echo in 2016 with EF 45-50%.  Possible tachycardia-mediated cardiomyopathy, we do not know how long he has been in atrial fibrillation.  No significant CAD on 3/12 cath.  - Volume status OK overall. Continue lasix 40 mg po for now. Place ted hose.  - Continue Toprol XL 50 mg BID for rate control. Pressures too soft for am dose.  - With pressures in 90s will drop Entresto back to 24/26 to leave room for toprol.   Rate control most important currently. - Plan for TEE/DCCV tomorrow.  2. Atrial fibrillation: Admitted with RVR, ?tachy-mediated cardiomyopathy.  HR now in 80-90s on Toprol XL 50 mg bid.  - Continue to load amiodarone to maintain NSR.  - Will plan TEE/DCCV after Eliquis. Plan for tomorrow morning.    - Could eventually benefit from Afib ablation.  Will need insurance -> Medicaid.  3. Diabetes: - Per primary. 4. Lack  of insurance:  - Main source of his non-compliance per patient. Social work and Best boy following. No change. Will help get medications via HF funds on discharge.  5. Elevated troponin: 2.1 max and trended down.  Likely demand ischemia with no CAD on cath. 6. Foot erythema: ?Fungal.  He is on clotrimazole but also covering with clindamycin, per primary team. No change to current plan.   Plan for TEE/DCCV tomorrow morning. Likely home tomorrow afternoon.   Shirley Friar, PA-C  04/23/2016  10:39 AM   Advanced Heart Failure Team Pager [phone] (M-F; 7a - 4p)  Please contact [facility] for night-coverage after hours (4p -7a ) and weekends on [email or web address]  Patient seen with PA, agree with the above note.  HR controlled.  BP soft today.  Volume status looks good.  - Continue po Lasix.  - With soft BP,drop back on Entresto to 24/26 bid.  - Continue current Toprol XL.  - If BP more stable tomorrow, add spironolactone 12.5 daily.   He remains in atrial fibrillation with stable rate.  Will plan TEE-DCCV tomorrow, will have had 5 doses of Eliquis.  I have discussed risks/benefits with him and he agrees to proceed.   Loralie Champagne 04/23/2016 11:38 AM

## 2016-04-24 ENCOUNTER — Encounter (HOSPITAL_COMMUNITY): Payer: Self-pay | Admitting: Certified Registered Nurse Anesthetist

## 2016-04-24 ENCOUNTER — Inpatient Hospital Stay (HOSPITAL_COMMUNITY): Payer: Medicaid Other

## 2016-04-24 ENCOUNTER — Encounter (HOSPITAL_COMMUNITY)
Admission: EM | Disposition: A | Discharge status: Home / Self Care | Payer: Self-pay | Source: Home / Self Care | Attending: Internal Medicine

## 2016-04-24 ENCOUNTER — Inpatient Hospital Stay (HOSPITAL_COMMUNITY): Payer: Medicaid Other | Admitting: Anesthesiology

## 2016-04-24 ENCOUNTER — Other Ambulatory Visit: Payer: Self-pay

## 2016-04-24 DIAGNOSIS — I4891 Unspecified atrial fibrillation: Secondary | ICD-10-CM

## 2016-04-24 DIAGNOSIS — I340 Nonrheumatic mitral (valve) insufficiency: Secondary | ICD-10-CM

## 2016-04-24 DIAGNOSIS — E1100 Type 2 diabetes mellitus with hyperosmolarity without nonketotic hyperglycemic-hyperosmolar coma (NKHHC): Secondary | ICD-10-CM

## 2016-04-24 HISTORY — PX: TEE WITHOUT CARDIOVERSION: SHX5443

## 2016-04-24 LAB — CBC
HCT: 50 % (ref 39.0–52.0)
Hemoglobin: 16.6 g/dL (ref 13.0–17.0)
MCH: 28.3 pg (ref 26.0–34.0)
MCHC: 33.2 g/dL (ref 30.0–36.0)
MCV: 85.2 fL (ref 78.0–100.0)
Platelets: 232 10*3/uL (ref 150–400)
RBC: 5.87 MIL/uL — ABNORMAL HIGH (ref 4.22–5.81)
RDW: 15.6 % — AB (ref 11.5–15.5)
WBC: 8 10*3/uL (ref 4.0–10.5)

## 2016-04-24 LAB — GLUCOSE, CAPILLARY: GLUCOSE-CAPILLARY: 79 mg/dL (ref 65–99)

## 2016-04-24 LAB — DIGOXIN LEVEL: Digoxin Level: <0.2 ng/mL — ABNORMAL LOW (ref 0.8–2.0)

## 2016-04-24 LAB — BASIC METABOLIC PANEL
ANION GAP: 8 (ref 5–15)
BUN: 16 mg/dL (ref 6–20)
CALCIUM: 8.6 mg/dL — AB (ref 8.9–10.3)
CO2: 25 mmol/L (ref 22–32)
Chloride: 105 mmol/L (ref 101–111)
Creatinine, Ser: 1 mg/dL (ref 0.61–1.24)
Glucose, Bld: 66 mg/dL (ref 65–99)
Potassium: 4.5 mmol/L (ref 3.5–5.1)
Sodium: 138 mmol/L (ref 135–145)

## 2016-04-24 SURGERY — ECHOCARDIOGRAM, TRANSESOPHAGEAL
Anesthesia: Monitor Anesthesia Care

## 2016-04-24 MED ORDER — BUTAMBEN-TETRACAINE-BENZOCAINE 2-2-14 % EX AERO
INHALATION_SPRAY | CUTANEOUS | Status: DC | PRN
  Administered 2016-04-24: 2 via TOPICAL

## 2016-04-24 MED ORDER — ATORVASTATIN CALCIUM 80 MG PO TABS: 80.0000 mg | ORAL_TABLET | Freq: Every day | ORAL | 3 refills | Status: DC

## 2016-04-24 MED ORDER — METOPROLOL SUCCINATE ER 50 MG PO TB24: 50.0000 mg | ORAL_TABLET | Freq: Two times a day (BID) | ORAL | 4 refills | Status: DC

## 2016-04-24 MED ORDER — CLOTRIMAZOLE 1 % EX CREA: TOPICAL_CREAM | Freq: Two times a day (BID) | CUTANEOUS | 3 refills | Status: DC

## 2016-04-24 MED ORDER — AMIODARONE HCL 200 MG PO TABS: ORAL_TABLET | ORAL | 4 refills | Status: DC

## 2016-04-24 MED ORDER — DIGOXIN 125 MCG PO TABS: 0.1250 mg | ORAL_TABLET | Freq: Every day | ORAL | 4 refills | Status: DC

## 2016-04-24 MED ORDER — SACUBITRIL-VALSARTAN 24-26 MG PO TABS: 1.0000 | ORAL_TABLET | Freq: Two times a day (BID) | ORAL | 1 refills | Status: DC

## 2016-04-24 MED ORDER — FUROSEMIDE 40 MG PO TABS
40.0000 mg | ORAL_TABLET | Freq: Every day | ORAL | 4 refills | Status: DC
Start: 2016-04-25 — End: 2017-07-16

## 2016-04-24 MED ORDER — APIXABAN 5 MG PO TABS: 5.0000 mg | ORAL_TABLET | Freq: Two times a day (BID) | ORAL | 4 refills | Status: DC

## 2016-04-24 MED ORDER — LACTATED RINGERS IV SOLN
INTRAVENOUS | Status: DC | PRN
  Administered 2016-04-24: 08:00:00 via INTRAVENOUS

## 2016-04-24 MED ORDER — PHENYLEPHRINE 40 MCG/ML (10ML) SYRINGE FOR IV PUSH (FOR BLOOD PRESSURE SUPPORT)
PREFILLED_SYRINGE | INTRAVENOUS | Status: DC | PRN
  Administered 2016-04-24: 40 ug via INTRAVENOUS

## 2016-04-24 MED ORDER — POTASSIUM CHLORIDE CRYS ER 20 MEQ PO TBCR: 20.0000 meq | EXTENDED_RELEASE_TABLET | Freq: Every day | ORAL | 4 refills | Status: DC

## 2016-04-24 MED ORDER — PROPOFOL 500 MG/50ML IV EMUL
INTRAVENOUS | Status: DC | PRN
  Administered 2016-04-24: 100 ug/kg/min via INTRAVENOUS

## 2016-04-24 MED ORDER — CLINDAMYCIN HCL 300 MG PO CAPS: 300.0000 mg | ORAL_CAPSULE | Freq: Four times a day (QID) | ORAL | 0 refills | Status: DC

## 2016-04-24 MED FILL — KLOR-CON M20 TABLET: 20 | 34 days supply | Qty: 34 | Fill #0

## 2016-04-24 MED FILL — FUROSEMIDE 40 MG TABLET: 40 | 100 days supply | Qty: 100 | Fill #0

## 2016-04-24 MED FILL — DIGITEK 125 MCG TABLET: 125 | 34 days supply | Qty: 34 | Fill #0

## 2016-04-24 MED FILL — ENTRESTO 24 MG-26 MG TABLET: 24-26 | 30 days supply | Qty: 60 | Fill #0

## 2016-04-24 MED FILL — METOPROLOL SUCC ER 50 MG TA: 50 | 34 days supply | Qty: 68 | Fill #0

## 2016-04-24 MED FILL — AMIODARONE HCL 200 MG TAB: 200 | 34 days supply | Qty: 50 | Fill #0

## 2016-04-24 NOTE — Progress Notes (Addendum)
Patient ID: Darius [name], male   DOB: 12/30/1955, 61 y.o.   MRN: [id number]   Advanced Heart Failure Rounding Note   Patient ID: Darius [name] MRN: [id number] DOB/AGE: 05/02/1955 61 y.o.   Admit date: 04/17/2016 Referring physician: Domenic Polite Primary Cardiologist: [name] Reason for Consultation: CHF, atrial fibrillation  HPI:  Echo 04/19/2016: EF 15%, mild LV dilation with diffuse hypokinesis, mild MR, moderately dilated RV with severely decreased systolic function.   S/p [facility] 04/21/16 with no significant CAD and elevated filling pressures with low cardiac output.   TEE today showed EF 15-20%, moderately dilated RV with moderately decreased systolic function, and thrombus in LA appendage.  DCCV not done due to thrombus.   No complaints today.  Denies dyspnea.  HR is controlled, still in atrial fibrillation.   LHC/RHC Procedural Findings: No significant coronary disease Hemodynamics (mmHg) RA mean 14 RV 45/15 PA 50/28, mean 36 PCWP mean 22 LV 104/20 AO 101/75 Oxygen saturations: PA 59% AO 92% Cardiac Output (Fick) 4.85  Cardiac Index (Fick) 1.89 PVR 2.9 WU  Meds PTA Current Meds  Medication Sig  . aspirin EC 325 MG tablet Take 1 tablet (325 mg total) by mouth daily.  . metFORMIN (GLUCOPHAGE) 1000 MG tablet take 1 tablet by mouth twice a day with food    Current Scheduled Meds . [MAR Hold] amiodarone  400 mg Oral BID  . [MAR Hold] apixaban  5 mg Oral BID  . [MAR Hold] atorvastatin  80 mg Oral q1800  . [MAR Hold] clindamycin  300 mg Oral Q6H  . [MAR Hold] clotrimazole   Topical BID  . [MAR Hold] digoxin  0.125 mg Oral Daily  . [MAR Hold] furosemide  40 mg Oral Daily  . [MAR Hold] Influenza vac split quadrivalent PF  0.5 mL Intramuscular Tomorrow-1000  . [MAR Hold] insulin aspart  0-9 Units Subcutaneous TID WC  . [MAR Hold] insulin starter kit- pen needles  1 kit Other Once  . [MAR Hold] insulin starter kit- syringes  1 kit Other Once  . [MAR Hold] mouth rinse  15 mL  Mouth Rinse BID  . [MAR Hold] metoprolol succinate  50 mg Oral BID  . [MAR Hold] potassium chloride  20 mEq Oral Daily  . [MAR Hold] sacubitril-valsartan  1 tablet Oral BID  . [MAR Hold] sodium chloride flush  3 mL Intravenous Q12H  . [MAR Hold] sodium chloride flush  3 mL Intravenous Q12H   Continuous infusions . sodium chloride     PRN meds [MAR Hold] sodium chloride, [MAR Hold] acetaminophen, [MAR Hold] hydrALAZINE, [DISCONTINUED] ondansetron **OR** [MAR Hold] ondansetron (ZOFRAN) IV, [MAR Hold] ondansetron (ZOFRAN) IV, [MAR Hold] sodium chloride flush, [MAR Hold] sodium chloride flush    Vitals:   04/24/16 0537 04/24/16 0730  BP: 101/64 (!) 114/91  Pulse: 76 81  Resp: 20 13  Temp: 97.7 F (36.5 C) 97.8 F (36.6 C)    Wt Readings from Last 3 Encounters:  04/24/16 280 lb (127 kg)  10/04/15 225 lb 3.2 oz (102.2 kg)  09/21/15 221 lb (100.2 kg)    Physical Exam General: NAD Neck: JVP 7 cm. No thyromegaly or thyroid nodule.  Lungs: Slight decreased basilar sounds.   CV: Nondisplaced PMI.  Heart irregular S1/S2, no S3/S4, no murmur. 1+ ankle edema.  Abdomen: Soft, NT, ND, no HSM. No bruits or masses. +BS   Skin: Erythematous, demarcated rash right > left foot.  Neurologic: Alert and oriented x 3.  Psych: Normal affect. Extremities: No clubbing or cyanosis.  HEENT: Normal.   Telemetry: Personally reviewed, Afib 80-90s   Labs: CBC  Recent Labs  04/23/16 0420 04/24/16 0407  WBC 8.8 8.0  HGB 16.6 16.6  HCT 50.8 50.0  MCV 86.4 85.2  PLT 246 001   Basic Metabolic Panel  Recent Labs  04/23/16 0420 04/24/16 0407  NA 138 138  K 4.2 4.5  CL 101 105  CO2 32 25  GLUCOSE 113* 66  BUN 14 16  CREATININE 1.30* 1.00  CALCIUM 8.7* 8.6*   Radiology: - CXR: Mild pulmonary edema.  EKG: 04/18/16 Afib RVR   ASSESSMENT AND PLAN:  61 yo with history of chronic primarily diastolic CHF, paroxysmal atrial fibrillation, diabetes, medication noncompliance and lack of  insurance was admitted with atrial fibrillation with RVR and acute systolic CHF.  1. Acute systolic CHF: EF 64% with RV dysfunction on echo this admission, prior echo in 2016 with EF 45-50%.  Possible tachycardia-mediated cardiomyopathy, we do not know how long he has been in atrial fibrillation.  No significant CAD on 3/12 cath. He looks euvolemic.  - Continue Lasix 40 mg daily.  - Continue Toprol XL 50 mg BID.  - Continue digoxin, level ok today.  - Continue Entresto 24/26 bid.  No BP room to titrate.  - Would hold off on spironolactone for the time being.  Hopefully will start at outpatient followup.  2. Atrial fibrillation: Admitted with RVR, ?tachy-mediated cardiomyopathy.  HR now in 80-90s on Toprol XL 50 mg bid and digoxin.   - Continue to load amiodarone to maintain NSR.  - Attempted TEE-DCCV today, but had LA appendage thrombus.  He has only just started Eliquis, and HR is currently controlled.  Continue Eliquis, repeat TEE in 1 month with plan to try DCCV at that time if thrombus has resolved.   - Could eventually benefit from Afib ablation.  Will need insurance -> Medicaid.  3. Diabetes: Per primary. 4. Lack of insurance: Main source of his non-compliance per patient. Social work and Best boy following. No change. Will help get medications via HF funds on discharge.  5. Elevated troponin: 2.1 max and trended down.  Likely demand ischemia with no CAD on cath. 6. Foot erythema: ?Fungal.  He is on clotrimazole but also covering with clindamycin, per primary team. No change to current plan.  7. Disposition: Think he can go home today.  Will need help with meds via HF fund.  Will need followup with me in 10-14 days. Will need TEE-DCCV rescheduled in about a month.  Cardiac meds for home: Eliquis 5 mg bid, amiodarone 400 mg bid x 3 more days then 200 mg bid x 7 days then 200 mg daily after that, Lasix 40 mg daily, Entresto 24/26 bid, Toprol XL 50 mg bid, digoxin 0.125 mg daily, KCl 20  daily, atorvastatin 20 daily.   Loralie Champagne, MD  04/24/2016 8:37 AM

## 2016-04-24 NOTE — Anesthesia Preprocedure Evaluation (Addendum)
Anesthesia Evaluation  Patient identified by MRN, date of birth, ID band Patient awake    Reviewed: Allergy & Precautions, NPO status , Patient's Chart, lab work & pertinent test results  Airway Mallampati: IV  TM Distance: <3 FB Neck ROM: Full  Mouth opening: Limited Mouth Opening  Dental  (+) Missing, Poor Dentition, Loose, Dental Advisory Given   Pulmonary sleep apnea ,     + decreased breath sounds      Cardiovascular Exercise Tolerance: Poor +CHF  + dysrhythmias  Rhythm:Irregular Rate:Tachycardia  Hx of noncompliance , now c EF 15%   Neuro/Psych    GI/Hepatic   Endo/Other  diabetesMorbid obesity  Renal/GU      Musculoskeletal   Abdominal (+) + obese,   Peds  Hematology   Anesthesia Other Findings   Reproductive/Obstetrics                          Anesthesia Physical Anesthesia Plan  ASA: IV  Anesthesia Plan: MAC   Post-op Pain Management:    Induction: Intravenous  Airway Management Planned: Natural Airway  Additional Equipment:   Intra-op Plan:   Post-operative Plan: Possible Post-op intubation/ventilation  Informed Consent: I have reviewed the patients History and Physical, chart, labs and discussed the procedure including the risks, benefits and alternatives for the proposed anesthesia with the patient or authorized representative who has indicated his/her understanding and acceptance.   Dental advisory given  Plan Discussed with: CRNA  Anesthesia Plan Comments:        Anesthesia Quick Evaluation

## 2016-04-24 NOTE — Interval H&P Note (Signed)
History and Physical Interval Note:  04/24/2016 8:17 AM  Darius [name]  has presented today for surgery, with the diagnosis of a fib  The various methods of treatment have been discussed with the patient and family. After consideration of risks, benefits and other options for treatment, the patient has consented to  Procedure(s): TRANSESOPHAGEAL ECHOCARDIOGRAM (TEE) (N/A) CARDIOVERSION (N/A) as a surgical intervention .  The patient's history has been reviewed, patient examined, no change in status, stable for surgery.  I have reviewed the patient's chart and labs.  Questions were answered to the patient's satisfaction.     Dalton [employer]

## 2016-04-24 NOTE — Anesthesia Postprocedure Evaluation (Addendum)
Anesthesia Post Note  Patient: Darius [name]  Procedure(s) Performed: Procedure(s) (LRB): TRANSESOPHAGEAL ECHOCARDIOGRAM (TEE) (N/A)  Patient location during evaluation: Endoscopy Anesthesia Type: MAC Level of consciousness: awake and alert Pain management: pain level controlled Vital Signs Assessment: post-procedure vital signs reviewed and stable Respiratory status: spontaneous breathing, nonlabored ventilation, respiratory function stable and patient connected to nasal cannula oxygen Cardiovascular status: stable and blood pressure returned to baseline Anesthetic complications: no       Last Vitals:  Vitals:   04/24/16 0845 04/24/16 0855  BP: (!) 114/92 (!) 119/95  Pulse: (!) 55 76  Resp: 19 (!) 23  Temp:      Last Pain:  Vitals:   04/24/16 0932  TempSrc:   PainSc: 3                  [name],JAMES TERRILL

## 2016-04-24 NOTE — Progress Notes (Signed)
Pt transported to ENDO of the monitor

## 2016-04-24 NOTE — H&P (View-Only) (Signed)
Advanced Heart Failure Rounding Note   Patient ID: Darius [name] MRN: [id number] DOB/AGE: 04/02/1955 61 y.o.   Admit date: 04/17/2016 Referring physician: Domenic Polite Primary Cardiologist: [name] Reason for Consultation: CHF, atrial fibrillation  HPI:  Echo 04/19/2016: EF 15%, mild LV dilation with diffuse hypokinesis, mild MR, moderately dilated RV with severely decreased systolic function.   S/p [facility] 04/21/16 with no significant CAD and elevated filling pressures with low cardiac output.   Feeling OK this morning. Has neighbor looking after cats. Denies lightheadedness or dizziness. No chest pain or palpitations. Denies fatigue.   Out 1.5 L and down 2 lbs.    LHC/RHC Procedural Findings: No significant coronary disease Hemodynamics (mmHg) RA mean 14 RV 45/15 PA 50/28, mean 36 PCWP mean 22 LV 104/20 AO 101/75 Oxygen saturations: PA 59% AO 92% Cardiac Output (Fick) 4.85  Cardiac Index (Fick) 1.89 PVR 2.9 WU  Review of systems complete and found to be negative unless listed in HPI.   Past Medical History: 1. Atrial fibrillation: Paroxysmal.  He had TEE-DCCV in 2013.  Re-admitted with atrial fibrillation/RVR in 2/16, success DCCV in 5/16.  Admitted with atrial fibrillation/RVR in 3/18.  2. Chronic primarily diastolic CHF: Cardiolite (1/15) with EF 51%, no ischemia or infarction.  Echo (2/16) with EF 45-50%, severe biatrial enlargement.  3. OSA: Uses CPAP.  4. Type II diabetes.   Family History  Problem Relation Age of Onset  . Diabetes Father     died in his 19s  . Hypertension Father     died in her 46s  . Seizures Mother   . Hypertension Sister   . Hypertension Paternal Grandfather   . Heart attack Neg Hx   . Stroke Neg Hx    Social History   Social History  . Marital status: Single    Spouse name: N/A  . Number of children: N/A  . Years of education: N/A   Occupational History  . unemployeed    Social History Main Topics  . Smoking status: Never  Smoker  . Smokeless tobacco: Never Used  . Alcohol use Yes     Comment: some  . Drug use: No  . Sexual activity: No   Other Topics Concern  . Not on file   Social History Narrative   Admits that he eats poorly and eats a lot at work.      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    Meds PTA Current Meds  Medication Sig  . aspirin EC 325 MG tablet Take 1 tablet (325 mg total) by mouth daily.  . metFORMIN (GLUCOPHAGE) 1000 MG tablet take 1 tablet by mouth twice a day with food    Current Scheduled Meds . amiodarone  400 mg Oral BID  . apixaban  5 mg Oral BID  . atorvastatin  80 mg Oral q1800  . clindamycin  300 mg Oral Q6H  . clotrimazole   Topical BID  . digoxin  0.125 mg Oral Daily  . furosemide  40 mg Oral Daily  . Influenza vac split quadrivalent PF  0.5 mL Intramuscular Tomorrow-1000  . insulin aspart  0-9 Units Subcutaneous TID WC  . insulin starter kit- pen needles  1 kit Other Once  . insulin starter kit- syringes  1 kit Other Once  . mouth rinse  15 mL Mouth Rinse BID  . metoprolol succinate  50 mg Oral BID  . potassium chloride  20 mEq Oral Daily  . sacubitril-valsartan  1 tablet Oral BID  .  sodium chloride flush  3 mL Intravenous Q12H   Continuous infusions   PRN meds sodium chloride, acetaminophen, hydrALAZINE, [DISCONTINUED] ondansetron **OR** ondansetron (ZOFRAN) IV, ondansetron (ZOFRAN) IV, sodium chloride flush    Vitals:   04/23/16 0453 04/23/16 0950  BP: (!) 95/58 93/74  Pulse: 77   Resp: 20   Temp: 97.9 F (36.6 C)     Wt Readings from Last 3 Encounters:  04/23/16 283 lb 6.4 oz (128.5 kg)  10/04/15 225 lb 3.2 oz (102.2 kg)  09/21/15 221 lb (100.2 kg)    Physical Exam General: NAD, seated in recliner.  Neck: JVP 7-8 cm. No thyromegaly or thyroid nodule.  Lungs: Slight decreased basilar sounds.   CV: Nondisplaced PMI.  Heart irregular S1/S2, no S3/S4, no murmur.  Trace to 1+ edema 1/3 way to knee. No carotid bruit.   Normal pedal pulses.  Abdomen: Soft, NT, ND, no HSM. No bruits or masses. +BS   Skin: Erythematous, demarcated rash right > left foot.  Neurologic: Alert and oriented x 3.  Psych: Normal affect. Extremities: No clubbing or cyanosis.  HEENT: Normal.   Telemetry: Personally reviewed, Afib 80-90s   Labs: CBC  Recent Labs  04/22/16 0242 04/23/16 0420  WBC 10.9* 8.8  HGB 16.3 16.6  HCT 49.4 50.8  MCV 86.1 86.4  PLT 239 193   Basic Metabolic Panel  Recent Labs  04/21/16 0235 04/22/16 0242 04/23/16 0420  NA 134* 138 138  K 4.2 3.8 4.2  CL 99* 97* 101  CO2 26 33* 32  GLUCOSE 85 74 113*  BUN _0 CREATININE 1.20 1.27* 1.30*  CALCIUM 8.7* 8.8* 8.7*  MG 2.2  --   --    Radiology: - CXR: Mild pulmonary edema.  EKG: 04/18/16 Afib RVR   ASSESSMENT AND PLAN:  61 yo with history of chronic primarily diastolic CHF, paroxysmal atrial fibrillation, diabetes, medication noncompliance and lack of insurance was admitted with atrial fibrillation with RVR and acute systolic CHF.  1. Acute systolic CHF: EF 79% with RV dysfunction on echo this admission, prior echo in 2016 with EF 45-50%.  Possible tachycardia-mediated cardiomyopathy, we do not know how long he has been in atrial fibrillation.  No significant CAD on 3/12 cath.  - Volume status OK overall. Continue lasix 40 mg po for now. Place ted hose.  - Continue Toprol XL 50 mg BID for rate control. Pressures too soft for am dose.  - With pressures in 90s will drop Entresto back to 24/26 to leave room for toprol.   Rate control most important currently. - Plan for TEE/DCCV tomorrow.  2. Atrial fibrillation: Admitted with RVR, ?tachy-mediated cardiomyopathy.  HR now in 80-90s on Toprol XL 50 mg bid.  - Continue to load amiodarone to maintain NSR.  - Will plan TEE/DCCV after Eliquis. Plan for tomorrow morning.    - Could eventually benefit from Afib ablation.  Will need insurance -> Medicaid.  3. Diabetes: - Per primary. 4. Lack  of insurance:  - Main source of his non-compliance per patient. Social work and Best boy following. No change. Will help get medications via HF funds on discharge.  5. Elevated troponin: 2.1 max and trended down.  Likely demand ischemia with no CAD on cath. 6. Foot erythema: ?Fungal.  He is on clotrimazole but also covering with clindamycin, per primary team. No change to current plan.   Plan for TEE/DCCV tomorrow morning. Likely home tomorrow afternoon.   Shirley Friar, PA-C  04/23/2016  10:39 AM   Advanced Heart Failure Team Pager [phone] (M-F; 7a - 4p)  Please contact [facility] for night-coverage after hours (4p -7a ) and weekends on [email or web address]  Patient seen with PA, agree with the above note.  HR controlled.  BP soft today.  Volume status looks good.  - Continue po Lasix.  - With soft BP,drop back on Entresto to 24/26 bid.  - Continue current Toprol XL.  - If BP more stable tomorrow, add spironolactone 12.5 daily.   He remains in atrial fibrillation with stable rate.  Will plan TEE-DCCV tomorrow, will have had 5 doses of Eliquis.  I have discussed risks/benefits with him and he agrees to proceed.   Loralie Champagne 04/23/2016 11:38 AM

## 2016-04-24 NOTE — Transfer of Care (Signed)
Immediate Anesthesia Transfer of Care Note  Patient: Darius [name]  Procedure(s) Performed: Procedure(s): TRANSESOPHAGEAL ECHOCARDIOGRAM (TEE) (N/A) CARDIOVERSION (N/A)  Patient Location: Endoscopy Unit  Anesthesia Type:MAC  Level of Consciousness: awake, alert  and oriented  Airway & Oxygen Therapy: Patient Spontanous Breathing and Patient connected to nasal cannula oxygen  Post-op Assessment: Report given to RN, Post -op Vital signs reviewed and stable and Patient moving all extremities X 4  Post vital signs: Reviewed and stable  Last Vitals:  Vitals:   04/24/16 0537 04/24/16 0730  BP: 101/64 (!) 114/91  Pulse: 76 81  Resp: 20 13  Temp: 36.5 C 36.6 C    Last Pain:  Vitals:   04/24/16 0730  TempSrc: Oral  PainSc:          Complications: No apparent anesthesia complications

## 2016-04-24 NOTE — Progress Notes (Signed)
Heart Failure Navigator Consult Note  Presentation: per Dr Roselyn Bering is a 61 yo with history of chronic primarily diastolic CHF, paroxysmal atrial fibrillation, diabetes, medication noncompliance and lack of insurance was admitted with atrial fibrillation with RVR and acute systolic CHF.  Patient had a successful cardioversion in 5/16 and has not been seen by cardiology since that time.  Echo in 2/16 showed EF 45-50%.  He was no longer taking anticoagulation.  Patient developed dyspnea with minimal exertion over about a week, and his legs and abdomen began to swell.  He developed orthopnea.  He felt palpitations.   At admission, he was noted to be in atrial fibrillation with RVR.  Troponin peaked at 2.1.  No chest pain.  He had mild pulmonary edema on CXR.  Echo was done, showing EF down to 15%.  He has been rate-controlled with Toprol XL.   Echo: EF 15%, mild LV dilation with diffuse hypokinesis, mild MR, moderately dilated RV with severely decreased systolic function.  .   Past Medical History:  Diagnosis Date  . Atrial fibrillation ([place])    unable to tolerate versed and fentanyl sedation and required gen anesthesia for TEE;  s/p TEE-DCCV (failed);  Amiodarone started  . Chronic diastolic heart failure ([place])    a. Echo 03/27/11: EF 50-55%, inferior hypokinesis, moderate LAE, mild RVE, normal pulmonary pressures. ;   b.  TEE (04/03/11): EF 40-45%, mild MR moderate LAE, no LAA clot, mild RAE;  c.  Echocardiogram (02/2013): EF 60-65%, Gr 2 DD, mildly dilated Ao root (Ao root dimension 39 mm), MAC, mild LAE, normal RVF  . Diabetes mellitus, type 2 ([place])   . Hx of cardiovascular stress test    Lexiscan Myoview (02/2013): No ischemia or scar, EF 51%, low risk  . Morbid obesity ([place])   . Sleep apnea    a. sleep test 01/2013:  very severy OSA    Social History   Social History  . Marital status: Single    Spouse name: N/A  . Number of children: N/A  . Years of education: N/A    Occupational History  . unemployeed    Social History Main Topics  . Smoking status: Never Smoker  . Smokeless tobacco: Never Used  . Alcohol use Yes     Comment: some  . Drug use: No  . Sexual activity: No   Other Topics Concern  . None   Social History Narrative   Admits that he eats poorly and eats a lot at work.      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    ECHO:Study Conclusions-04/19/16  - Left ventricle: The cavity size was mildly dilated. Wall   thickness was increased in a pattern of mild LVH. Systolic   function was severely reduced. The estimated ejection fraction   was 15%. Diffuse hypokinesis. The study is not technically   sufficient to allow evaluation of LV diastolic function. - Aortic valve: Mildly calcified annulus. Trileaflet. - Mitral valve: Calcified annulus. There was mild regurgitation. - Right ventricle: The cavity size was moderately dilated. Systolic   function was severely reduced. - Right atrium: Central venous pressure (est): 15 mm Hg. - Atrial septum: No defect or patent foramen ovale was identified. - Tricuspid valve: There was mild regurgitation. - Pulmonary arteries: PA peak pressure: 38 mm Hg (S). - Pericardium, extracardiac: A trivial pericardial effusion was   identified.  Impressions:  - Mild LVH with mild chamber dilatation and LVEF approximately 15%,  overall diffuse hypokinesis. Indeterminate diastolic function.   Mild mitral regurgitation. Moderate right ventricular enlargement   with severely reduced contraction. Mild tricuspid regurgitation   with PASP 38 mmHg and elevated CVP. Trivial pericardial effusion  BNP    Component Value Date/Time   BNP 659.8 (H) 04/17/2016 1713    ProBNP    Component Value Date/Time   PROBNP 24.0 01/19/2013 1049     Education Assessment and Provision:  Detailed education and instructions provided on heart failure disease management including the  following:  Signs and symptoms of Heart Failure When to call the physician Importance of daily weights Low sodium diet Fluid restriction Medication management Anticipated future follow-up appointments  Patient education given on each of the above topics.  Patient acknowledges understanding and acceptance of all instructions.  I spoke to Mr. [name] regarding his current hospitalization and HF diagnosis.  He was very talkative and difficult to redirect.  I did review the importance of daily weights as well as when to contact the physician --he tells me that he has a scale at home.  We discussed a low sodium diet and high sodium foods to avoid.  He clearly had difficulty understanding added salt versus sodium content---"I thought soup was healthy--I don't add any salt to anything"  I reviewed this topic at length.  I recommended that he take part in our HF [employer] program and he was quite adamant that he did not want someone to "follow him around".  I have sent all of his medications to [facility] to be filled via [facility] as he tells me that he has no insurance and will likely not have money to fill all meds at discharge.  He will follow in the AHF Clinic--I have given him directions to the AHF Clinic and Outpatient Pharmacy along with a map.    Education Materials:  "Living Better With Heart Failure" Booklet, Daily Weight Tracker Tool    High Risk Criteria for Readmission and/or Poor Patient Outcomes:  (Recommend Follow-up with Advanced Heart Failure Clinic)--yes   EF <30%- 15%  2 or more admissions in 6 months- No  Difficult social situation- yes--? Difficulty with Health literacy  Demonstrates medication noncompliance- Yes    Barriers of Care:  Health Literacy, Insight into disease process, Knowledge and compliance  Discharge Planning:   Patient plans to return to home alone.  He has a male friend locally and sister in [place].

## 2016-04-24 NOTE — Discharge Summary (Signed)
Physician Discharge Summary   Patient ID: Darius [name] MRN: [id number] DOB/AGE: 63-Mar-1957 63 y.o.  Admit date: 04/17/2016 Discharge date: 04/24/2016  Primary Care Physician:  [name]., DO  Discharge Diagnoses:     . Atrial fibrillation with RVR ([place]) . OSA - C-pap . Acute on chronic Systolic CHF, EF 78% ([place]) . Elevated troponin   Left atrial appendage thrombus   Diabetes mellitus   Bilateral foot tinea pedis   Hypertension   Consults: Cardiology  Recommendations for Outpatient Follow-up:  1. Please repeat CBC/BMET at next visit  DIET: Heart healthy diet, carb modified    Allergies:  No Known Allergies   DISCHARGE MEDICATIONS: Current Discharge Medication List    START taking these medications   Details  apixaban (ELIQUIS) 5 MG TABS tablet Take 1 tablet (5 mg total) by mouth 2 (two) times daily. Qty: 60 tablet, Refills: 4    atorvastatin (LIPITOR) 80 MG tablet Take 1 tablet (80 mg total) by mouth at bedtime. Qty: 30 tablet, Refills: 3    clindamycin (CLEOCIN) 300 MG capsule Take 1 capsule (300 mg total) by mouth 4 (four) times daily. X 7 days Qty: 28 capsule, Refills: 0    clotrimazole (LOTRIMIN) 1 % cream Apply topically 2 (two) times daily. Apply to both feet Qty: 30 g, Refills: 3    digoxin (LANOXIN) 0.125 MG tablet Take 1 tablet (0.125 mg total) by mouth daily. Qty: 30 tablet, Refills: 4    furosemide (LASIX) 40 MG tablet Take 1 tablet (40 mg total) by mouth daily. Qty: 30 tablet, Refills: 4    metoprolol succinate (TOPROL-XL) 50 MG 24 hr tablet Take 1 tablet (50 mg total) by mouth 2 (two) times daily. Take with or immediately following a meal. Qty: 60 tablet, Refills: 4    potassium chloride SA (K-DUR,KLOR-CON) 20 MEQ tablet Take 1 tablet (20 mEq total) by mouth daily. Qty: 30 tablet, Refills: 4    sacubitril-valsartan (ENTRESTO) 24-26 MG Take 1 tablet by mouth 2 (two) times daily. Qty: 60 tablet, Refills: 1      CONTINUE these  medications which have CHANGED   Details  amiodarone (PACERONE) 200 MG tablet Take amiodarone 400 mg (2 tabs) twice a day x 3 more days,  then 200 mg (1 tab) twice a day x 7 days, then 200 mg daily after that Qty: 60 tablet, Refills: 4      CONTINUE these medications which have NOT CHANGED   Details  metFORMIN (GLUCOPHAGE) 1000 MG tablet take 1 tablet by mouth twice a day with food Qty: 60 tablet, Refills: 3   Associated Diagnoses: Type 2 diabetes mellitus with hyperosmolarity without coma, without long-term current use of insulin (HCC)      STOP taking these medications     aspirin EC 325 MG tablet          Brief H and P: For complete details please refer to admission H and P, but in briefMichael Hicksis a 61 y.o.malewith history of chronic combined systolic and diastolic CHF, atrial fibrillation, diabetes mellitus who has not been taking his cardiac medications for last 2 years presents to the ER because of worsening shortness of breath on exertion with palpitations. He was found to have A. fib with RVR, severe systolic CHF, left heart cath was unremarkable   Hospital Course:   Chronic A. fib with RVR., Tachycardia mediated cardiomyopathy -  Patient was placed on Cardizem drip, which has been currently weaned off  - TSH stable - Cardiology  was consulted. Initially plan was for cardioversion. However patient was found to have left atrial appendage thrombus on TEE and had just only started eliquis. Cardiology recommended to continue amiodarone  400 mg bid x 3 more days then 200 mg bid x 7 days then 200 mg daily after that and eliquis 5mg  BID. Patient will need follow-up and cardioversion will be scheduled in about a month.    Acute on chronic systolic non-ischemic CHF last EF 15%.  - 2-D echo showed EF of 53%, systolic function severely reduced, diffuse hypokinesis - Prior echo in 2016 had shown EF of 45-50%. Patient underwent cardiac catheterization on 3/12 which showed no  significant coronary artery disease.  - Cardiology recommended Lasix 40 mg daily, Toprol-XL, digoxin, started on Entresto.  - Negative balance of 8.0 L, weight 310 at the time of admission down to 280 lbs at discharge  Hypertension.  - Improved, continue Lasix, Toprol-XL  Dyslipidemia.  -  On statin continue.   Mild elevation in troponin. -  Trend is flat and non-ACS pattern, due to demand ischemia from RVR, chest pain-free,  - Continue beta blocker, -  Echo showed severely depressed EF of 15% with diffuse hypokinesis, unremarkableleft heart catheterization on 04/21/2016.   Bilateral foot tinea pedis with superimposed mild bacterial cellulitis. -  Placed on Chlortrimazole cream along with oral clindamycin for another 7 days  Prediabetes -Patient was placed on sliding scale insulin while inpatient, diabetic education was provided.  Day of Discharge BP (!) 119/95   Pulse 76   Temp 97.5 F (36.4 C) (Oral)   Resp (!) 23   Ht 6\' 2"  (1.88 m)   Wt 127 kg (280 lb)   SpO2 95%   BMI 35.95 kg/m   Physical Exam: General: Alert and awake oriented x3 not in any acute distress. HEENT: anicteric sclera, pupils reactive to light and accommodation CVS: S1-S2 clear, irregularly irregular Chest: Decreased breath sounds at the bases Abdomen: soft nontender, nondistended, normal bowel sounds Extremities: no cyanosis, clubbing, 1+ edema noted bilaterally Neuro: Cranial nerves II-XII intact, no focal neurological deficits Skin: Rash on bilateral feet   The results of significant diagnostics from this hospitalization (including imaging, microbiology, ancillary and laboratory) are listed below for reference.    LAB RESULTS: Basic Metabolic Panel:  Recent Labs Lab 04/21/16 0235  04/23/16 0420 04/24/16 0407  NA 134*  < > 138 138  K 4.2  < > 4.2 4.5  CL 99*  < > 101 105  CO2 26  < > 32 25  GLUCOSE 85  < > 113* 66  BUN 19  < > 14 16  CREATININE 1.20  < > 1.30* 1.00  CALCIUM 8.7*   < > 8.7* 8.6*  MG 2.2  --   --   --   < > = values in this interval not displayed. Liver Function Tests:  Recent Labs Lab 04/18/16 0508  AST 46*  ALT 36  ALKPHOS 68  BILITOT 1.0  PROT 6.1*  ALBUMIN 3.2*   No results for input(s): LIPASE, AMYLASE in the last 168 hours. No results for input(s): AMMONIA in the last 168 hours. CBC:  Recent Labs Lab 04/18/16 0508  04/23/16 0420 04/24/16 0407  WBC 8.4  < > 8.8 8.0  NEUTROABS 4.2  --   --   --   HGB 15.9  < > 16.6 16.6  HCT 47.6  < > 50.8 50.0  MCV 85.0  < > 86.4 85.2  PLT 245  < >  246 232  < > = values in this interval not displayed. Cardiac Enzymes:  Recent Labs Lab 04/18/16 0508 04/18/16 1135  TROPONINI 2.10* 1.67*   BNP: Invalid input(s): POCBNP CBG:  Recent Labs Lab 04/23/16 2144 04/24/16 0746  GLUCAP 105* 79    Significant Diagnostic Studies:  Dg Chest 2 View  Result Date: 04/17/2016 CLINICAL DATA:  Shortness of breath with exertion. History of atrial fibrillation. EXAM: CHEST  2 VIEW COMPARISON:  PA and lateral chest 04/02/2014 and CT chest 04/03/2014. FINDINGS: There is cardiomegaly and mild interstitial edema. Small left effusion is identified. No consolidative process or pneumothorax. IMPRESSION: Cardiomegaly mild interstitial edema with a small left pleural effusion. Electronically Signed   By: Inge Rise M.D.   On: 04/17/2016 15:20    2D ECHO: Study Conclusions  - Left ventricle: The cavity size was mildly dilated. Wall   thickness was increased in a pattern of mild LVH. Systolic   function was severely reduced. The estimated ejection fraction   was 15%. Diffuse hypokinesis. The study is not technically   sufficient to allow evaluation of LV diastolic function. - Aortic valve: Mildly calcified annulus. Trileaflet. - Mitral valve: Calcified annulus. There was mild regurgitation. - Right ventricle: The cavity size was moderately dilated. Systolic   function was severely reduced. - Right  atrium: Central venous pressure (est): 15 mm Hg. - Atrial septum: No defect or patent foramen ovale was identified. - Tricuspid valve: There was mild regurgitation. - Pulmonary arteries: PA peak pressure: 38 mm Hg (S). - Pericardium, extracardiac: A trivial pericardial effusion was   identified.  Impressions:  - Mild LVH with mild chamber dilatation and LVEF approximately 15%,   overall diffuse hypokinesis. Indeterminate diastolic function.   Mild mitral regurgitation. Moderate right ventricular enlargement   with severely reduced contraction. Mild tricuspid regurgitation   with PASP 38 mmHg and elevated CVP. Trivial pericardial effusion.   Disposition and Follow-up: Discharge Instructions    (HEART FAILURE PATIENTS) Call MD:  Anytime you have any of the following symptoms: 1) 3 pound weight gain in 24 hours or 5 pounds in 1 week 2) shortness of breath, with or without a dry hacking cough 3) swelling in the hands, feet or stomach 4) if you have to sleep on extra pillows at night in order to breathe.    Complete by:  As directed    Diet - low sodium heart healthy    Complete by:  As directed    Diet Carb Modified    Complete by:  As directed    Increase activity slowly    Complete by:  As directed        DISPOSITION: home    [place]., DO. Go on 05/05/2016.   Specialty:  Family Medicine Why:  @2 :19JY Contact information: [place] [address] [phone]        Loralie Champagne, MD. Go on 04/30/2016.   Specialty:  Cardiology Why:  at 1200 for hospital follow up. Please bring all medications to visit. Code for parking is 5002. Leisure centre manager through [employer] off of [address]. Underground parking on your right. 1st floor. Can also park in lower ED lot and enter BLUE awning.  Contact information: [address] [place] [address] [phone]            Time spent on  Discharge: 14mins   Signed:   Zelma Mazariego M.D. [facility] 04/24/2016, 10:59 AM Pager:  [phone]      

## 2016-04-24 NOTE — Anesthesia Procedure Notes (Signed)
Procedure Name: MAC Date/Time: 04/24/2016 8:10 AM Performed by: Garrison Columbus T Pre-anesthesia Checklist: Patient identified, Emergency Drugs available, Suction available and Patient being monitored Patient Re-evaluated:Patient Re-evaluated prior to inductionOxygen Delivery Method: Simple face mask Preoxygenation: Pre-oxygenation with 100% oxygen Intubation Type: IV induction Placement Confirmation: positive ETCO2 and breath sounds checked- equal and bilateral Dental Injury: Teeth and Oropharynx as per pre-operative assessment

## 2016-04-24 NOTE — CV Procedure (Signed)
Procedure: TEE  Indication: Atrial fibrillation.  Sedation: Propofol per anesthesiology.   Findings: Please see echo section for full report.  Mildly dilated LV with normal wall thickness.  Diffuse hypokinesis, EF 15-20%.  Moderately dilated RV with moderately decreased systolic function.  Moderate left atrial enlargement with thrombus noted in the left atrial appendage.  Moderate right atrial enlargement.  No PFO/ASD noted by color doppler.  There was mild mitral regurgitation.  Trileaflet aortic valve with trivial aortic insufficiency, no aortic stenosis.  Trivial tricuspid regurgitation.  Normal caliber aorta with no significant plaque.    Impression: LAA thrombus, no DCCV today.  Will continue anticoagulation for 1 month then repeat TEE with possible DCCV if clot resolved.   Darius [name] 04/24/2016 8:34 AM

## 2016-04-24 NOTE — Progress Notes (Signed)
The following medications have been sent to the [facility] to be filled via the [facility].  Per patient he no longer has insurance.  I have also referred him to [facility] LCSW for assistance with ongoing financial resources.  Amiodarone 400 mg bid x 3 more days then 200 mg bid x 7 days then 200 mg daily after that Lasix 40 mg daily Toprol XL 50 mg bid Digoxin 0.125 mg daily KCl 20 daily Atorvastatin 20 daily  Entresto 24/26 BID-- 30 day free card given Eliquis 5 mg bid --Samples given

## 2016-04-30 ENCOUNTER — Inpatient Hospital Stay (HOSPITAL_COMMUNITY): Admit: 2016-04-30 | Payer: BLUE CROSS/BLUE SHIELD

## 2016-05-05 ENCOUNTER — Telehealth (HOSPITAL_COMMUNITY): Payer: Self-pay | Admitting: Surgery

## 2016-05-05 ENCOUNTER — Ambulatory Visit: Payer: Self-pay | Admitting: Family Medicine

## 2016-05-05 NOTE — Progress Notes (Deleted)
HPI:  Darius [name] is a 61 yo with a complicated PMH significant for very poor compliance with follow up and health recommendations (he has not been in to see [place] in almost 3 years), A. Fib, CcCHF, Diabetes, Morbid Obesity, OSA here for hospital follow up. Per discharge documents: Hospitalized 3/8-3/15/18 Diagnosis: A. Fib with RVR, L atrial appendage thrombus, CHF -potentially secondary to poor compliance with medications per notes -treated with amiodarone and eliquis -cardiology follow up and cardioversion planned - he canceled his follow up appt with cardiology - now rescheduled and has spoken with nurse navigator with heart failure clinic -EF 15 %, cath ok, per cardiology on lasix 27m daily, bb, enestro; wt 280 at discharge (down 30 lbs in hospital! Other diagnoses/Complication: -HTN, Dislipidemia -foot fungus with cellulitis (per discharge doc): on clotrimazole and clinda for this -"prediabetes" per discharge doc - on SSI in hospital, diabetic education in hospital - per my notes from 2015 has diabetes - Hgba1c 15.5 7 months ago and 6.2 in hospital?  ROS: See pertinent positives and negatives per HPI.  Past Medical History:  Diagnosis Date  . Atrial fibrillation ([place]    unable to tolerate versed and fentanyl sedation and required gen anesthesia for TEE;  s/p TEE-DCCV (failed);  Amiodarone started  . Chronic diastolic heart failure ([place]    a. Echo 03/27/11: EF 50-55%, inferior hypokinesis, moderate LAE, mild RVE, normal pulmonary pressures. ;   b.  TEE (04/03/11): EF 40-45%, mild MR moderate LAE, no LAA clot, mild RAE;  c.  Echocardiogram (02/2013): EF 60-65%, Gr 2 DD, mildly dilated Ao root (Ao root dimension 39 mm), MAC, mild LAE, normal RVF  . Diabetes mellitus, type 2 ([place]   . Hx of cardiovascular stress test    Lexiscan Myoview (02/2013): No ischemia or scar, EF 51%, low risk  . Morbid obesity ([place]   . Sleep apnea    a. sleep test 01/2013:  very severy OSA    Past Surgical  History:  Procedure Laterality Date  . CARDIOVERSION  04/01/2011   Procedure: CARDIOVERSION;  Surgeon: BLelon Perla MD;  [facility];  Service: Cardiovascular;  Laterality: N/A;  . CARDIOVERSION  04/03/2011   Procedure: CARDIOVERSION;  Surgeon: BLelon Perla MD;  [facility];  Service: Cardiovascular;  Laterality: N/A;  . CARDIOVERSION  04/03/2011   Procedure: CARDIOVERSION;  Surgeon: BLelon Perla MD;  [facility] [facility]  Service: Cardiovascular;  Laterality: N/A;  . CARDIOVERSION N/A 06/15/2014   Procedure: CARDIOVERSION;  Surgeon: DLarey Dresser MD;  [place] [facility]  Service: Cardiovascular;  Laterality: N/A;  . I&D EXTREMITY Right 09/19/2015   Procedure: IRRIGATION AND DEBRIDEMENT EXTREMITY;  Surgeon: CMcarthur Rossetti MD;  [place] [facility]  Service: Orthopedics;  Laterality: Right;  . RIGHT/LEFT HEART CATH AND CORONARY ANGIOGRAPHY N/A 04/21/2016   Procedure: Right/Left Heart Cath and Coronary Angiography;  Surgeon: DLarey Dresser MD;  [place] [facility];  Service: Cardiovascular;  Laterality: N/A;  . TEE WITHOUT CARDIOVERSION  04/01/2011   Procedure: TRANSESOPHAGEAL ECHOCARDIOGRAM (TEE);  Surgeon: BLelon Perla MD;  [facility];  Service: Cardiovascular;  Laterality: N/A;  . TEE WITHOUT CARDIOVERSION  04/03/2011   Procedure: TRANSESOPHAGEAL ECHOCARDIOGRAM (TEE);  Surgeon: BLelon Perla MD;  [facility];  Service: Cardiovascular;  Laterality: N/A;  . TEE WITHOUT CARDIOVERSION  04/03/2011   Procedure: TRANSESOPHAGEAL ECHOCARDIOGRAM (TEE);  Surgeon: BLelon Perla MD;  [place]  Service: Cardiovascular;  Laterality: N/A;  .  TEE WITHOUT CARDIOVERSION N/A 04/24/2016   Procedure: TRANSESOPHAGEAL ECHOCARDIOGRAM (TEE);  Surgeon: [name], MD;  Location[facility];  Service: Cardiovascular;  Laterality: N/A;    Family History  Problem Relation Age of Onset  . Diabetes Father     died in his 72s   . Hypertension Father     died in her 21s  . Seizures Mother   . Hypertension Sister   . Hypertension Paternal Grandfather   . Heart attack Neg Hx   . Stroke Neg Hx     Social History   Social History  . Marital status: Single    Spouse name: N/A  . Number of children: N/A  . Years of education: N/A   Occupational History  . unemployeed    Social History Main Topics  . Smoking status: Never Smoker  . Smokeless tobacco: Never Used  . Alcohol use Yes     Comment: some  . Drug use: No  . Sexual activity: No   Other Topics Concern  . Not on file   Social History Narrative   Admits that he eats poorly and eats a lot at work.      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     Current Outpatient Prescriptions:  .  amiodarone (PACERONE) 200 MG tablet, Take amiodarone 400 mg (2 tabs) twice a day x 3 more days,  then 200 mg (1 tab) twice a day x 7 days, then 200 mg daily after that, Disp: 60 tablet, Rfl: 4 .  apixaban (ELIQUIS) 5 MG TABS tablet, Take 1 tablet (5 mg total) by mouth 2 (two) times daily., Disp: 60 tablet, Rfl: 4 .  atorvastatin (LIPITOR) 80 MG tablet, Take 1 tablet (80 mg total) by mouth at bedtime., Disp: 30 tablet, Rfl: 3 .  clindamycin (CLEOCIN) 300 MG capsule, Take 1 capsule (300 mg total) by mouth 4 (four) times daily. X 7 days, Disp: 28 capsule, Rfl: 0 .  clotrimazole (LOTRIMIN) 1 % cream, Apply topically 2 (two) times daily. Apply to both feet, Disp: 30 g, Rfl: 3 .  digoxin (LANOXIN) 0.125 MG tablet, Take 1 tablet (0.125 mg total) by mouth daily., Disp: 30 tablet, Rfl: 4 .  furosemide (LASIX) 40 MG tablet, Take 1 tablet (40 mg total) by mouth daily., Disp: 30 tablet, Rfl: 4 .  metFORMIN (GLUCOPHAGE) 1000 MG tablet, take 1 tablet by mouth twice a day with food, Disp: 60 tablet, Rfl: 3 .  metoprolol succinate (TOPROL-XL) 50 MG 24 hr tablet, Take 1 tablet (50 mg total) by mouth 2 (two) times daily. Take with or immediately following a  meal., Disp: 60 tablet, Rfl: 4 .  potassium chloride SA (K-DUR,KLOR-CON) 20 MEQ tablet, Take 1 tablet (20 mEq total) by mouth daily., Disp: 30 tablet, Rfl: 4 .  sacubitril-valsartan (ENTRESTO) 24-26 MG, Take 1 tablet by mouth 2 (two) times daily., Disp: 60 tablet, Rfl: 1  EXAM:  There were no vitals filed for this visit.  There is no height or weight on file to calculate BMI.  GENERAL: vitals reviewed and listed above, alert, oriented, appears well hydrated and in no acute distress  HEENT: atraumatic, conjunttiva clear, no obvious abnormalities on inspection of external nose and ears  NECK: no obvious masses on inspection  LUNGS: clear to auscultation bilaterally, no wheezes, rales or rhonchi, good air movement  CV: HRRR, no peripheral edema  MS: moves all extremities without noticeable abnormality  PSYCH: pleasant and cooperative, no obvious depression  or anxiety  ASSESSMENT AND PLAN:  Discussed the following assessment and plan:  No diagnosis found.  -Patient advised to return or notify a doctor immediately if symptoms worsen or persist or new concerns arise.  There are no Patient Instructions on file for this visit.  Colin Benton R., DO

## 2016-05-05 NOTE — Telephone Encounter (Signed)
Heart Failure Nurse Navigator Post- Discharge Telephone Call   I called to check on Darius [name] regarding his recent hospitalization.  He tells me that he has been feeling well and his legs are "looking good" meaning no swelling.  He could not recall today or yesterday's weight--yet says that he is "not up to 300 lbs yet".  Of note- his discharge weight on 04/24/16 was 280 lbs.  I encouraged him to weigh daily and reviewed when to contact the HF Clinic.  He was "confused" and missed his initial follow-up appt --he has a new appt scheduled with the [facility] Clinic scheduled for 05/08/16 at 1:30 and I confirmed this appt with him.  He does tell me that he has only been taking Eliquis once daily due to the fact that he was "not sure how much to take" (no labels on bottles/samples) and "they would have ran out before my appt".  After verifying in chart I instructed him to take Eliquis 5 mg twice daily as prescribed and let him know that we will give him more samples when he is in for his appt.  He was very appreciative of information and says that he plans to be at appt this Thursday.

## 2016-05-08 ENCOUNTER — Ambulatory Visit (HOSPITAL_COMMUNITY)
Admission: RE | Admit: 2016-05-08 | Discharge: 2016-05-08 | Disposition: A | Discharge status: Home / Self Care | Payer: Medicaid Other | Source: Ambulatory Visit | Attending: Cardiology | Admitting: Cardiology

## 2016-05-08 VITALS — BP 108/56 | HR 80 | Wt 276.5 lb

## 2016-05-08 DIAGNOSIS — Z823 Family history of stroke: Secondary | ICD-10-CM | POA: Insufficient documentation

## 2016-05-08 DIAGNOSIS — I10 Essential (primary) hypertension: Secondary | ICD-10-CM

## 2016-05-08 DIAGNOSIS — E785 Hyperlipidemia, unspecified: Secondary | ICD-10-CM | POA: Diagnosis not present

## 2016-05-08 DIAGNOSIS — Z8249 Family history of ischemic heart disease and other diseases of the circulatory system: Secondary | ICD-10-CM | POA: Insufficient documentation

## 2016-05-08 DIAGNOSIS — I4819 Other persistent atrial fibrillation: Secondary | ICD-10-CM

## 2016-05-08 DIAGNOSIS — I429 Cardiomyopathy, unspecified: Secondary | ICD-10-CM | POA: Diagnosis present

## 2016-05-08 DIAGNOSIS — I5042 Chronic combined systolic (congestive) and diastolic (congestive) heart failure: Secondary | ICD-10-CM

## 2016-05-08 DIAGNOSIS — G4733 Obstructive sleep apnea (adult) (pediatric): Secondary | ICD-10-CM | POA: Diagnosis not present

## 2016-05-08 DIAGNOSIS — Z79899 Other long term (current) drug therapy: Secondary | ICD-10-CM | POA: Insufficient documentation

## 2016-05-08 DIAGNOSIS — I5022 Chronic systolic (congestive) heart failure: Secondary | ICD-10-CM | POA: Insufficient documentation

## 2016-05-08 DIAGNOSIS — I513 Intracardiac thrombosis, not elsewhere classified: Secondary | ICD-10-CM

## 2016-05-08 DIAGNOSIS — E119 Type 2 diabetes mellitus without complications: Secondary | ICD-10-CM | POA: Diagnosis not present

## 2016-05-08 DIAGNOSIS — I481 Persistent atrial fibrillation: Secondary | ICD-10-CM

## 2016-05-08 DIAGNOSIS — I4891 Unspecified atrial fibrillation: Secondary | ICD-10-CM | POA: Insufficient documentation

## 2016-05-08 DIAGNOSIS — I110 Hypertensive heart disease with heart failure: Secondary | ICD-10-CM | POA: Insufficient documentation

## 2016-05-08 DIAGNOSIS — Z7984 Long term (current) use of oral hypoglycemic drugs: Secondary | ICD-10-CM | POA: Diagnosis not present

## 2016-05-08 DIAGNOSIS — Z833 Family history of diabetes mellitus: Secondary | ICD-10-CM | POA: Insufficient documentation

## 2016-05-08 DIAGNOSIS — Z7901 Long term (current) use of anticoagulants: Secondary | ICD-10-CM | POA: Diagnosis not present

## 2016-05-08 LAB — BASIC METABOLIC PANEL
ANION GAP: 9 (ref 5–15)
BUN: 17 mg/dL (ref 6–20)
CO2: 25 mmol/L (ref 22–32)
Calcium: 9 mg/dL (ref 8.9–10.3)
Chloride: 104 mmol/L (ref 101–111)
Creatinine, Ser: 1.09 mg/dL (ref 0.61–1.24)
GFR calc non Af Amer: >60 mL/min (ref >60)
Glucose, Bld: 111 mg/dL — ABNORMAL HIGH (ref 65–99)
POTASSIUM: 4.2 mmol/L (ref 3.5–5.1)
SODIUM: 138 mmol/L (ref 135–145)

## 2016-05-08 MED ORDER — APIXABAN 5 MG PO TABS: 5.0000 mg | ORAL_TABLET | Freq: Two times a day (BID) | ORAL | 3 refills | Status: DC

## 2016-05-08 MED ORDER — SACUBITRIL-VALSARTAN 24-26 MG PO TABS: 1.0000 | ORAL_TABLET | Freq: Two times a day (BID) | ORAL | 11 refills | Status: DC

## 2016-05-08 NOTE — Progress Notes (Signed)
Medication Samples have been provided to the patient.  Drug name: Entresto      Strength: 5 mg        Qty: 4 packs   LOT: [id number] and [id number]  Exp.Date: 1/20 and 3/20  Dosing instructions: Take 1 Tablet Two times Daily  The patient has been instructed regarding the correct time, dose, and frequency of taking this medication, including desired effects and most common side effects.   Elige Ko S 1:48 PM 05/08/2016

## 2016-05-08 NOTE — Progress Notes (Addendum)
PCP: Colin Benton, DO  Primary HF Cardiologist:  Dr. 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   Admitted 09/16/74-1/95/09 for acute systolic CHF and atrial fibrillation with RVR. Had a R/L heart cath on 04/21/16, no CAD, right heart pressures elevated, low output. Echo showed EF 15%. Plan was to undergo TEE/DC-CV, however he was found to have a left atrial appendage thrombus and had only started Eliquis 5 days prior. He was discharged with plans for TEE/DC-CV in about a month. He was started on Entresto 24/33m BID during admission. Discharged home on Lasix 429mdaily, digoxin 0.12567mand metoprolol XL 80m3mD. Discharge 280 pounds.   He returns today for HF follow up. Overall feeling ok. Denies SOB/PND/Orthopnea. Denies dizziness. Not weighing at home.Says he is going to get a new scale. Not using CPAP. Eating salty foods.  No bleeding problems. Taking all medicaitons. Has difficulty paying for medications. He is not working.   Cardiac Studies  RHC/LHC 04/21/2016  RA mean 14 RV 45/15 PA 50/28, mean 36 PCWP mean 22 LV 104/20 AO 101/75 Oxygen saturations: PA 59% AO 92% Cardiac Output (Fick) 4.85  Cardiac Index (Fick) 1.89 PVR 2.9 WU  TEE- 30/2018 with LAA thrombus.   ECHO 04/24/2016 EF 15% Peak PA pressure 38 mm hg   Labs  04/24/2016: K 4.5 Creatinine 1.00  ROS: All systems negative except as listed in HPI, PMH and Problem List.  SH:  Social History   Social History  . Marital status: Single    Spouse name: N/A  . Number of children: N/A  . Years of education: N/A   Occupational History  . unemployeed    Social History Main Topics  . Smoking status: Never Smoker  . Smokeless tobacco: Never Used  . Alcohol use Yes     Comment: some  . Drug use: No  . Sexual activity: No   Other Topics Concern  .  Not on file   Social History Narrative   Admits that he eats poorly and eats a lot at work.      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 Diabetes Father     died in his 70s 19sHypertension Father     died in her 60s 62sSeizures Mother   . Hypertension Sister   . Hypertension Paternal Grandfather   . Heart attack Neg Hx   . Stroke Neg Hx     Past Medical History:  Diagnosis Date  . Atrial fibrillation (HCC)[place] unable to tolerate versed and fentanyl sedation and required gen anesthesia for TEE;  s/p TEE-DCCV (failed);  Amiodarone started  . Chronic diastolic heart failure (HCC)[place] a. Echo 03/27/11: EF 50-55%, inferior hypokinesis, moderate LAE, mild RVE, normal pulmonary pressures. ;   b.  TEE (04/03/11): EF 40-45%, mild MR moderate LAE, no LAA clot, mild RAE;  c.  Echocardiogram (02/2013): EF 60-65%, Gr 2 DD, mildly dilated Ao root (Ao root dimension 39 mm), MAC, mild LAE, normal RVF  . Diabetes mellitus, type 2 (HCC)[place]. Hx of cardiovascular stress test    Lexiscan Myoview (02/2013): No ischemia or scar, EF 51%, low risk  . Morbid obesity (HCC)[place].  Sleep apnea    a. sleep test 01/2013:  very severy OSA    Current Outpatient Prescriptions  Medication Sig Dispense Refill  . amiodarone (PACERONE) 200 MG tablet Take amiodarone 400 mg (2 tabs) twice a day x 3 more days,  then 200 mg (1 tab) twice a day x 7 days, then 200 mg daily after that 60 tablet 4  . apixaban (ELIQUIS) 5 MG TABS tablet Take 1 tablet (5 mg total) by mouth 2 (two) times daily. 180 tablet 3  . atorvastatin (LIPITOR) 80 MG tablet Take 1 tablet (80 mg total) by mouth at bedtime. 30 tablet 3  . clindamycin (CLEOCIN) 300 MG capsule Take 1 capsule (300 mg total) by mouth 4 (four) times daily. X 7 days 28 capsule 0  . clotrimazole (LOTRIMIN) 1 % cream Apply topically 2 (two) times daily. Apply to both feet 30 g 3  . digoxin (LANOXIN) 0.125 MG  tablet Take 1 tablet (0.125 mg total) by mouth daily. 30 tablet 4  . furosemide (LASIX) 40 MG tablet Take 1 tablet (40 mg total) by mouth daily. 30 tablet 4  . metFORMIN (GLUCOPHAGE) 1000 MG tablet take 1 tablet by mouth twice a day with food 60 tablet 3  . metoprolol succinate (TOPROL-XL) 50 MG 24 hr tablet Take 1 tablet (50 mg total) by mouth 2 (two) times daily. Take with or immediately following a meal. 60 tablet 4  . potassium chloride SA (K-DUR,KLOR-CON) 20 MEQ tablet Take 1 tablet (20 mEq total) by mouth daily. 30 tablet 4  . sacubitril-valsartan (ENTRESTO) 24-26 MG Take 1 tablet by mouth 2 (two) times daily. 60 tablet 1   No current facility-administered medications for this encounter.     Vitals:   05/08/16 1328  BP: (!) 108/56  Pulse: 80  SpO2: 95%  Weight: 276 lb 8 oz (125.4 kg)    PHYSICAL EXAM:  General:  Well appearing. No resp difficulty. Walked in the clinic.  HEENT: normal Neck: supple. JVP 6-7 flat. Carotids 2+ bilaterally; no bruits. No lymphadenopathy or thryomegaly appreciated. Cor: PMI normal. Irregular rate & rhythm. No rubs, gallops or murmurs. Lungs: CTAB on room Abdomen: obese, soft, nontender, nondistended. No hepatosplenomegaly. No bruits or masses. Good bowel sounds. Extremities: no cyanosis, clubbing, rash, edema Neuro: alert & orientedx3, cranial nerves grossly intact. Moves all 4 extremities w/o difficulty. Affect pleasant.   ECG: Afib 78 bpm    ASSESSMENT & PLAN: 1. Chronic systolic CHF: Had [facility] 03/2295 cors ok. NICM, likely tachy cardia mediated in the setting of rapid atrial fibrillation. ECHO 04/2016 EF 15%. Plan to repeat ECHO after HF meds optimized.  Volulme status stable. Continue stable. Continue lasix 40 mg daily  - Entresto 24/55m BID.  - Continue digoxin 0.1217mdaily, dig level <0.2.  - Continue metoprolol 5074mL BID.  - Do the following things EVERYDAY: 1) Weigh yourself in the morning before breakfast. Write it down and keep it  in a log. 2) Take your medicines as prescribed 3) Eat low salt foods-Limit salt (sodium) to 2000 mg per day.  4) Stay as active as you can everyday 5) Limit all fluids for the day to less than 2 liters 2. Atrial fibrillation- This patients CHA2DS2-VASc Score 3 and unadjusted Ischemic Stroke Rate (% 5.9.per year) . Cut back amio to 200 mg daily on Saturday  - Continue Eliquis for anticoagulation. No bleeding problems.  - Will need TEE/DC-CV, schedule for the end of April.   3. DM: - A1c is  6.2. - Followed by PCP 4. HTN: Stable today.  5. HLD: on statin 6. OSA: Discussed using CPAP nightly   Set up TEE/DC-CV for April . Check BMET. Check EKG now. Follow up 6 weeks.   Isiac Breighner NP-C  1:27 PM

## 2016-05-08 NOTE — Patient Instructions (Signed)
Decrease Amiodarone to 200 mg daily, STARTING ON Saturday 3/31  Labs today  Your physician has requested that you have a TEE/Cardioversion. During a TEE, sound waves are used to create images of your heart. It provides your doctor with information about the size and shape of your heart and how well your heart's chambers and valves are working. In this test, a transducer is attached to the end of a flexible tube that is guided down you throat and into your esophagus (the tube leading from your mouth to your stomach) to get a more detailed image of your heart. Once the TEE has determined that a blood clot is not present, the cardioversion begins. Electrical Cardioversion uses a jolt of electricity to your heart either through paddles or wired patches attached to your chest. This is a controlled, usually prescheduled, procedure. This procedure is done at the hospital and you are not awake during the procedure. You usually go home the day of the procedure. Please see the instruction sheet given to you today for more information. THIS COULD BE DONE ON MON 4/16, THUR 4/19, MON 4/23 OR WED 4/25**PLEASE CALL ME BACK AND LET ME KNOW WHAT DAY WORKS FOR [phone] OPT 2  Your physician recommends that you schedule a follow-up appointment in: 6 weeks

## 2016-05-09 ENCOUNTER — Telehealth (HOSPITAL_COMMUNITY): Payer: Self-pay | Admitting: *Deleted

## 2016-05-09 NOTE — Telephone Encounter (Signed)
Pt and left message on triage line to schedule his TEE.  I called patient back but had to leave VM asking for him to call us back.

## 2016-05-12 ENCOUNTER — Telehealth (HOSPITAL_COMMUNITY): Payer: Self-pay | Admitting: Vascular Surgery

## 2016-05-12 ENCOUNTER — Encounter (HOSPITAL_COMMUNITY): Payer: Self-pay | Admitting: *Deleted

## 2016-05-12 NOTE — Telephone Encounter (Signed)
Patient called back and we have scheduled him for a DCCV/TEE for April 16th at 1:00pm with Dr. [name].  Patient is aware that he needs to arrive at 11:00 am on day of procedure.

## 2016-05-12 NOTE — Telephone Encounter (Signed)
See other phone note, everything has been arranged

## 2016-05-12 NOTE — Telephone Encounter (Signed)
Pt returned call to make TEE appt.. Please advise

## 2016-05-14 ENCOUNTER — Telehealth (HOSPITAL_COMMUNITY): Payer: Self-pay | Admitting: Pharmacist

## 2016-05-14 ENCOUNTER — Other Ambulatory Visit (HOSPITAL_COMMUNITY): Payer: Self-pay | Admitting: *Deleted

## 2016-05-14 ENCOUNTER — Telehealth (HOSPITAL_COMMUNITY): Payer: Self-pay | Admitting: *Deleted

## 2016-05-14 DIAGNOSIS — I4891 Unspecified atrial fibrillation: Secondary | ICD-10-CM

## 2016-05-14 NOTE — Telephone Encounter (Signed)
[employer] patient assistance approved for [employer] 24-26 mg BID through 05/12/17.   Ruta Hinds. Velva Harman, PharmD, BCPS, CPP Clinical Pharmacist Pager: [phone] Phone: [phone] 05/14/2016 9:17 AM

## 2016-05-14 NOTE — Telephone Encounter (Signed)
Instruction letter for DCCV/TEE reviewed and mailed to patient's home address.

## 2016-05-19 ENCOUNTER — Telehealth (HOSPITAL_COMMUNITY): Payer: Self-pay | Admitting: Pharmacist

## 2016-05-19 NOTE — Telephone Encounter (Signed)
[employer] patient assistance approved for Eliquis 5 mg BID through 05/17/17.   Ruta Hinds. Velva Harman, PharmD, BCPS, CPP Clinical Pharmacist Pager: [phone] Phone: [phone] 05/19/2016 12:46 PM

## 2016-05-26 ENCOUNTER — Encounter (HOSPITAL_COMMUNITY): Payer: Self-pay | Admitting: *Deleted

## 2016-05-26 ENCOUNTER — Ambulatory Visit (HOSPITAL_COMMUNITY)
Admission: RE | Admit: 2016-05-26 | Discharge: 2016-05-26 | Disposition: A | Discharge status: Home / Self Care | Payer: Medicaid Other | Source: Ambulatory Visit | Attending: Cardiology | Admitting: Cardiology

## 2016-05-26 ENCOUNTER — Ambulatory Visit (HOSPITAL_BASED_OUTPATIENT_CLINIC_OR_DEPARTMENT_OTHER): Payer: Medicaid Other

## 2016-05-26 ENCOUNTER — Ambulatory Visit (HOSPITAL_COMMUNITY): Payer: Medicaid Other | Admitting: Anesthesiology

## 2016-05-26 ENCOUNTER — Other Ambulatory Visit: Payer: Self-pay | Admitting: Cardiology

## 2016-05-26 ENCOUNTER — Encounter (HOSPITAL_COMMUNITY)
Admission: RE | Disposition: A | Discharge status: Home / Self Care | Payer: Self-pay | Source: Ambulatory Visit | Attending: Cardiology

## 2016-05-26 DIAGNOSIS — I4891 Unspecified atrial fibrillation: Secondary | ICD-10-CM

## 2016-05-26 DIAGNOSIS — Z6835 Body mass index (BMI) 35.0-35.9, adult: Secondary | ICD-10-CM | POA: Insufficient documentation

## 2016-05-26 DIAGNOSIS — I513 Intracardiac thrombosis, not elsewhere classified: Secondary | ICD-10-CM | POA: Diagnosis not present

## 2016-05-26 DIAGNOSIS — Z7901 Long term (current) use of anticoagulants: Secondary | ICD-10-CM | POA: Diagnosis not present

## 2016-05-26 DIAGNOSIS — Z7984 Long term (current) use of oral hypoglycemic drugs: Secondary | ICD-10-CM | POA: Diagnosis not present

## 2016-05-26 DIAGNOSIS — I110 Hypertensive heart disease with heart failure: Secondary | ICD-10-CM | POA: Insufficient documentation

## 2016-05-26 DIAGNOSIS — G4733 Obstructive sleep apnea (adult) (pediatric): Secondary | ICD-10-CM | POA: Insufficient documentation

## 2016-05-26 DIAGNOSIS — I5042 Chronic combined systolic (congestive) and diastolic (congestive) heart failure: Secondary | ICD-10-CM | POA: Diagnosis not present

## 2016-05-26 DIAGNOSIS — Z8249 Family history of ischemic heart disease and other diseases of the circulatory system: Secondary | ICD-10-CM | POA: Diagnosis not present

## 2016-05-26 DIAGNOSIS — E119 Type 2 diabetes mellitus without complications: Secondary | ICD-10-CM | POA: Diagnosis not present

## 2016-05-26 DIAGNOSIS — E785 Hyperlipidemia, unspecified: Secondary | ICD-10-CM | POA: Diagnosis not present

## 2016-05-26 HISTORY — PX: TEE WITHOUT CARDIOVERSION: SHX5443

## 2016-05-26 LAB — BASIC METABOLIC PANEL
Anion gap: 9 (ref 5–15)
BUN: 17 mg/dL (ref 6–20)
CHLORIDE: 106 mmol/L (ref 101–111)
CO2: 23 mmol/L (ref 22–32)
CREATININE: 1.02 mg/dL (ref 0.61–1.24)
Calcium: 9.1 mg/dL (ref 8.9–10.3)
GFR calc Af Amer: >60 mL/min (ref >60)
GFR calc non Af Amer: >60 mL/min (ref >60)
Glucose, Bld: 116 mg/dL — ABNORMAL HIGH (ref 65–99)
Potassium: 4.3 mmol/L (ref 3.5–5.1)
Sodium: 138 mmol/L (ref 135–145)

## 2016-05-26 LAB — GLUCOSE, CAPILLARY: Glucose-Capillary: 112 mg/dL — ABNORMAL HIGH (ref 65–99)

## 2016-05-26 SURGERY — ECHOCARDIOGRAM, TRANSESOPHAGEAL
Anesthesia: General

## 2016-05-26 MED ORDER — APIXABAN 5 MG PO TABS
5.0000 mg | ORAL_TABLET | Freq: Once | ORAL | Status: AC
  Administered 2016-05-26: 5 mg via ORAL
  Filled 2016-05-26: qty 1

## 2016-05-26 MED ORDER — SODIUM CHLORIDE 0.9 % IV SOLN
INTRAVENOUS | Status: DC
  Administered 2016-05-26: 12:00:00 via INTRAVENOUS

## 2016-05-26 MED ORDER — PROPOFOL 10 MG/ML IV BOLUS
INTRAVENOUS | Status: DC | PRN
  Administered 2016-05-26 (×2): 30 mg via INTRAVENOUS
  Administered 2016-05-26: 20 mg via INTRAVENOUS
  Administered 2016-05-26 (×2): 30 mg via INTRAVENOUS
  Administered 2016-05-26: 20 mg via INTRAVENOUS
  Administered 2016-05-26 (×5): 30 mg via INTRAVENOUS

## 2016-05-26 MED ORDER — LACTATED RINGERS IV SOLN
INTRAVENOUS | Status: DC
  Administered 2016-05-26: 11:00:00 via INTRAVENOUS

## 2016-05-26 MED ORDER — LIDOCAINE HCL (CARDIAC) 20 MG/ML IV SOLN
INTRAVENOUS | Status: DC | PRN
  Administered 2016-05-26: 50 mg via INTRAVENOUS

## 2016-05-26 MED ORDER — PERFLUTREN LIPID MICROSPHERE
INTRAVENOUS | Status: AC
  Filled 2016-05-26: qty 10

## 2016-05-26 MED FILL — METOPROLOL SUCC ER 50 MG TA: 50 | 34 days supply | Qty: 68 | Fill #1

## 2016-05-26 MED FILL — POTASSIUM CL ER 20 MEQ TABL: 20 | 34 days supply | Qty: 34 | Fill #1

## 2016-05-26 MED FILL — DIGITEK 125 MCG TABLET: 125 | 34 days supply | Qty: 34 | Fill #1

## 2016-05-26 NOTE — Discharge Instructions (Signed)

## 2016-05-26 NOTE — Transfer of Care (Signed)
Immediate Anesthesia Transfer of Care Note  Patient: Darius [name]  Procedure(s) Performed: Procedure(s): TRANSESOPHAGEAL ECHOCARDIOGRAM (TEE) (N/A)  Patient Location: Endoscopy Unit  Anesthesia Type:MAC  Level of Consciousness: awake, oriented, sedated, patient cooperative and responds to stimulation  Airway & Oxygen Therapy: Patient Spontanous Breathing and Patient connected to nasal cannula oxygen  Post-op Assessment: Report given to RN, Post -op Vital signs reviewed and stable, Patient moving all extremities and Patient moving all extremities X 4  Post vital signs: Reviewed and stable  Last Vitals:  Vitals:   05/26/16 1400 05/26/16 1406  BP: 105/68 (!) 116/44  Pulse: 86 85  Resp:  16  Temp:      Last Pain:  Vitals:   05/26/16 1406  TempSrc: Oral         Complications: No apparent anesthesia complications

## 2016-05-26 NOTE — Progress Notes (Signed)
  Echocardiogram Echocardiogram Transesophageal has been performed.  Darius [name] 05/26/2016, 2:07 PM

## 2016-05-26 NOTE — CV Procedure (Addendum)
Procedure: TEE  Indication: Atrial fibrillation, pre-ablation.  He had LA appendage thrombus 1 month ago by TEE and has been on Eliquis.   Findings: Please see echo section for full report.  The left ventricle was normal in size and thickness.  EF 25-30% with diffuse hypokinesis.  The right ventricle was mildly dilated with moderately decreased systolic function.  Moderate left atrial enlargement.  There was a small area of somewhat amorphous thrombus in the LA appendage.  The thrombus is significantly less prominent compared to the prior TEE.  Moderate right atrial enlargement.  No evidence for PFO or ASD.  Trivial TR.  Trivial mitral regurgitation.  Trileaflet aortic valve with trivial regurgitation, no stenosis.  Normal caliber thoracic aorta.   Impression: There is still a left atrial appendage thrombus present though it appears much smaller and more amorphous compared to the prior TEE.  He admits to missing a few doses of the Eliquis.  Would continue Eliquis without missing doses, would repeat TEE again in 1 month to see if it is completely resolved.  If so, plan DCCV at that time (no DCCV today).   Darius [name] 05/26/2016 1:54 PM

## 2016-05-26 NOTE — H&P (View-Only) (Signed)
 PCP: Hannah Kim, DO  Primary HF Cardiologist:  Dr. 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   Admitted 04/17/16-04/24/16 for acute systolic CHF and atrial fibrillation with RVR. Had a R/L heart cath on 04/21/16, no CAD, right heart pressures elevated, low output. Echo showed EF 15%. Plan was to undergo TEE/DC-CV, however he was found to have a left atrial appendage thrombus and had only started Eliquis 5 days prior. He was discharged with plans for TEE/DC-CV in about a month. He was started on Entresto 24/26mg BID during admission. Discharged home on Lasix 40mg daily, digoxin 0.125mg, and metoprolol XL 50mg BID. Discharge 280 pounds.   He returns today for HF follow up. Overall feeling ok. Denies SOB/PND/Orthopnea. Denies dizziness. Not weighing at home.Says he is going to get a new scale. Not using CPAP. Eating salty foods.  No bleeding problems. Taking all medicaitons. Has difficulty paying for medications. He is not working.   Cardiac Studies  RHC/LHC 04/21/2016  RA mean 14 RV 45/15 PA 50/28, mean 36 PCWP mean 22 LV 104/20 AO 101/75 Oxygen saturations: PA 59% AO 92% Cardiac Output (Fick) 4.85  Cardiac Index (Fick) 1.89 PVR 2.9 WU  TEE- 30/2018 with LAA thrombus.   ECHO 04/24/2016 EF 15% Peak PA pressure 38 mm hg   Labs  04/24/2016: K 4.5 Creatinine 1.00  ROS: All systems negative except as listed in HPI, PMH and Problem List.  SH:  Social History   Social History  . Marital status: Single    Spouse name: N/A  . Number of children: N/A  . Years of education: N/A   Occupational History  . unemployeed    Social History Main Topics  . Smoking status: Never Smoker  . Smokeless tobacco: Never Used  . Alcohol use Yes     Comment: some  . Drug use: No  . Sexual activity: No   Other Topics Concern  .  Not on file   Social History Narrative   Admits that he eats poorly and eats a lot at work.      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 Diabetes Father     died in his 70s  . Hypertension Father     died in her 60s  . Seizures Mother   . Hypertension Sister   . Hypertension Paternal Grandfather   . Heart attack Neg Hx   . Stroke Neg Hx     Past Medical History:  Diagnosis Date  . Atrial fibrillation (HCC)    unable to tolerate versed and fentanyl sedation and required gen anesthesia for TEE;  s/p TEE-DCCV (failed);  Amiodarone started  . Chronic diastolic heart failure (HCC)    a. Echo 03/27/11: EF 50-55%, inferior hypokinesis, moderate LAE, mild RVE, normal pulmonary pressures. ;   b.  TEE (04/03/11): EF 40-45%, mild MR moderate LAE, no LAA clot, mild RAE;  c.  Echocardiogram (02/2013): EF 60-65%, Gr 2 DD, mildly dilated Ao root (Ao root dimension 39 mm), MAC, mild LAE, normal RVF  . Diabetes mellitus, type 2 (HCC)   . Hx of cardiovascular stress test    Lexiscan Myoview (02/2013): No ischemia or scar, EF 51%, low risk  . Morbid obesity (HCC)   .   Sleep apnea    a. sleep test 01/2013:  very severy OSA    Current Outpatient Prescriptions  Medication Sig Dispense Refill  . amiodarone (PACERONE) 200 MG tablet Take amiodarone 400 mg (2 tabs) twice a day x 3 more days,  then 200 mg (1 tab) twice a day x 7 days, then 200 mg daily after that 60 tablet 4  . apixaban (ELIQUIS) 5 MG TABS tablet Take 1 tablet (5 mg total) by mouth 2 (two) times daily. 180 tablet 3  . atorvastatin (LIPITOR) 80 MG tablet Take 1 tablet (80 mg total) by mouth at bedtime. 30 tablet 3  . clindamycin (CLEOCIN) 300 MG capsule Take 1 capsule (300 mg total) by mouth 4 (four) times daily. X 7 days 28 capsule 0  . clotrimazole (LOTRIMIN) 1 % cream Apply topically 2 (two) times daily. Apply to both feet 30 g 3  . digoxin (LANOXIN) 0.125 MG  tablet Take 1 tablet (0.125 mg total) by mouth daily. 30 tablet 4  . furosemide (LASIX) 40 MG tablet Take 1 tablet (40 mg total) by mouth daily. 30 tablet 4  . metFORMIN (GLUCOPHAGE) 1000 MG tablet take 1 tablet by mouth twice a day with food 60 tablet 3  . metoprolol succinate (TOPROL-XL) 50 MG 24 hr tablet Take 1 tablet (50 mg total) by mouth 2 (two) times daily. Take with or immediately following a meal. 60 tablet 4  . potassium chloride SA (K-DUR,KLOR-CON) 20 MEQ tablet Take 1 tablet (20 mEq total) by mouth daily. 30 tablet 4  . sacubitril-valsartan (ENTRESTO) 24-26 MG Take 1 tablet by mouth 2 (two) times daily. 60 tablet 1   No current facility-administered medications for this encounter.     Vitals:   05/08/16 1328  BP: (!) 108/56  Pulse: 80  SpO2: 95%  Weight: 276 lb 8 oz (125.4 kg)    PHYSICAL EXAM:  General:  Well appearing. No resp difficulty. Walked in the clinic.  HEENT: normal Neck: supple. JVP 6-7 flat. Carotids 2+ bilaterally; no bruits. No lymphadenopathy or thryomegaly appreciated. Cor: PMI normal. Irregular rate & rhythm. No rubs, gallops or murmurs. Lungs: CTAB on room Abdomen: obese, soft, nontender, nondistended. No hepatosplenomegaly. No bruits or masses. Good bowel sounds. Extremities: no cyanosis, clubbing, rash, edema Neuro: alert & orientedx3, cranial nerves grossly intact. Moves all 4 extremities w/o difficulty. Affect pleasant.   ECG: Afib 78 bpm    ASSESSMENT & PLAN: 1. Chronic systolic CHF: Had [facility] 04/2016 cors ok. NICM, likely tachy cardia mediated in the setting of rapid atrial fibrillation. ECHO 04/2016 EF 15%. Plan to repeat ECHO after HF meds optimized.  Volulme status stable. Continue stable. Continue lasix 40 mg daily  - Entresto 24/26mg BID.  - Continue digoxin 0.125mg daily, dig level <0.2.  - Continue metoprolol 50mg XL BID.  - Do the following things EVERYDAY: 1) Weigh yourself in the morning before breakfast. Write it down and keep it  in a log. 2) Take your medicines as prescribed 3) Eat low salt foods-Limit salt (sodium) to 2000 mg per day.  4) Stay as active as you can everyday 5) Limit all fluids for the day to less than 2 liters 2. Atrial fibrillation- This patients CHA2DS2-VASc Score 3 and unadjusted Ischemic Stroke Rate (% 5.9.per year) . Cut back amio to 200 mg daily on Saturday  - Continue Eliquis for anticoagulation. No bleeding problems.  - Will need TEE/DC-CV, schedule for the end of April.   3. DM: - A1c is   6.2. - Followed by PCP 4. HTN: Stable today.  5. HLD: on statin 6. OSA: Discussed using CPAP nightly   Set up TEE/DC-CV for April . Check BMET. Check EKG now. Follow up 6 weeks.   Rachid Parham NP-C  1:27 PM  

## 2016-05-26 NOTE — Anesthesia Preprocedure Evaluation (Signed)
Anesthesia Evaluation  Patient identified by MRN, date of birth, ID band Patient awake    Reviewed: Allergy & Precautions, NPO status , Patient's Chart, lab work & pertinent test results  Airway Mallampati: IV  TM Distance: <3 FB Neck ROM: Full  Mouth opening: Limited Mouth Opening  Dental  (+) Missing, Poor Dentition, Loose, Dental Advisory Given   Pulmonary sleep apnea ,     + decreased breath sounds      Cardiovascular Exercise Tolerance: Poor +CHF  + dysrhythmias  Rhythm:Irregular Rate:Tachycardia  Hx of noncompliance , now c EF 15%   Neuro/Psych    GI/Hepatic   Endo/Other  diabetesMorbid obesity  Renal/GU      Musculoskeletal   Abdominal (+) + obese,   Peds  Hematology   Anesthesia Other Findings   Reproductive/Obstetrics                             Anesthesia Physical Anesthesia Plan  ASA: III  Anesthesia Plan: General   Post-op Pain Management:    Induction: Intravenous  Airway Management Planned: Mask  Additional Equipment:   Intra-op Plan:   Post-operative Plan:   Informed Consent: I have reviewed the patients History and Physical, chart, labs and discussed the procedure including the risks, benefits and alternatives for the proposed anesthesia with the patient or authorized representative who has indicated his/her understanding and acceptance.     Plan Discussed with: CRNA  Anesthesia Plan Comments:         Anesthesia Quick Evaluation

## 2016-05-26 NOTE — Interval H&P Note (Signed)
History and Physical Interval Note:  05/26/2016 1:38 PM  Darius [name]  has presented today for surgery, with the diagnosis of AFIB  The various methods of treatment have been discussed with the patient and family. After consideration of risks, benefits and other options for treatment, the patient has consented to  Procedure(s): CARDIOVERSION (N/A) TRANSESOPHAGEAL ECHOCARDIOGRAM (TEE) (N/A) as a surgical intervention .  The patient's history has been reviewed, patient examined, no change in status, stable for surgery.  I have reviewed the patient's chart and labs.  Questions were answered to the patient's satisfaction.     Dalton [employer]

## 2016-05-26 NOTE — Anesthesia Procedure Notes (Signed)
Procedure Name: MAC Date/Time: 05/26/2016 1:25 PM Performed by: Jacquiline Doe A Pre-anesthesia Checklist: Patient identified, Emergency Drugs available, Suction available, Patient being monitored and Timeout performed Patient Re-evaluated:Patient Re-evaluated prior to inductionOxygen Delivery Method: Nasal cannula Intubation Type: IV induction Placement Confirmation: positive ETCO2 Dental Injury: Teeth and Oropharynx as per pre-operative assessment

## 2016-05-27 NOTE — Anesthesia Postprocedure Evaluation (Addendum)
Anesthesia Post Note  Patient: Darius [name]  Procedure(s) Performed: Procedure(s) (LRB): TRANSESOPHAGEAL ECHOCARDIOGRAM (TEE) (N/A)  Patient location during evaluation: Endoscopy Anesthesia Type: MAC Level of consciousness: awake and alert Pain management: pain level controlled Vital Signs Assessment: post-procedure vital signs reviewed and stable Respiratory status: spontaneous breathing, nonlabored ventilation, respiratory function stable and patient connected to nasal cannula oxygen Cardiovascular status: stable and blood pressure returned to baseline Anesthetic complications: no       Last Vitals:  Vitals:   05/26/16 1410 05/26/16 1420  BP: 125/76 102/83  Pulse: 66 87  Resp: 19 16  Temp:      Last Pain:  Vitals:   05/26/16 1406  TempSrc: Oral                 Kiela Shisler,JAMES TERRILL

## 2016-06-17 MED FILL — ATORVASTATIN 80 MG TABLET: 80 | 34 days supply | Qty: 34 | Fill #0

## 2016-06-17 MED FILL — AMIODARONE HCL 200 MG TAB: 200 | 34 days supply | Qty: 34 | Fill #0

## 2016-06-20 ENCOUNTER — Encounter (HOSPITAL_COMMUNITY): Payer: Self-pay

## 2016-06-20 ENCOUNTER — Ambulatory Visit (HOSPITAL_COMMUNITY)
Admission: RE | Admit: 2016-06-20 | Discharge: 2016-06-20 | Disposition: A | Discharge status: Home / Self Care | Payer: Medicaid Other | Source: Ambulatory Visit | Attending: Cardiology | Admitting: Cardiology

## 2016-06-20 VITALS — BP 140/80 | HR 68 | Ht 74.0 in | Wt 300.0 lb

## 2016-06-20 DIAGNOSIS — E785 Hyperlipidemia, unspecified: Secondary | ICD-10-CM | POA: Insufficient documentation

## 2016-06-20 DIAGNOSIS — I5022 Chronic systolic (congestive) heart failure: Secondary | ICD-10-CM | POA: Insufficient documentation

## 2016-06-20 DIAGNOSIS — Z8249 Family history of ischemic heart disease and other diseases of the circulatory system: Secondary | ICD-10-CM | POA: Insufficient documentation

## 2016-06-20 DIAGNOSIS — Z823 Family history of stroke: Secondary | ICD-10-CM | POA: Insufficient documentation

## 2016-06-20 DIAGNOSIS — Z79899 Other long term (current) drug therapy: Secondary | ICD-10-CM | POA: Insufficient documentation

## 2016-06-20 DIAGNOSIS — Z7901 Long term (current) use of anticoagulants: Secondary | ICD-10-CM | POA: Insufficient documentation

## 2016-06-20 DIAGNOSIS — I481 Persistent atrial fibrillation: Secondary | ICD-10-CM | POA: Diagnosis not present

## 2016-06-20 DIAGNOSIS — G4733 Obstructive sleep apnea (adult) (pediatric): Secondary | ICD-10-CM | POA: Insufficient documentation

## 2016-06-20 DIAGNOSIS — Z820 Family history of epilepsy and other diseases of the nervous system: Secondary | ICD-10-CM | POA: Diagnosis not present

## 2016-06-20 DIAGNOSIS — Z7984 Long term (current) use of oral hypoglycemic drugs: Secondary | ICD-10-CM | POA: Insufficient documentation

## 2016-06-20 DIAGNOSIS — E119 Type 2 diabetes mellitus without complications: Secondary | ICD-10-CM | POA: Diagnosis not present

## 2016-06-20 DIAGNOSIS — I4819 Other persistent atrial fibrillation: Secondary | ICD-10-CM

## 2016-06-20 DIAGNOSIS — I5042 Chronic combined systolic (congestive) and diastolic (congestive) heart failure: Secondary | ICD-10-CM

## 2016-06-20 DIAGNOSIS — R000 Tachycardia, unspecified: Secondary | ICD-10-CM | POA: Diagnosis not present

## 2016-06-20 DIAGNOSIS — Z833 Family history of diabetes mellitus: Secondary | ICD-10-CM | POA: Insufficient documentation

## 2016-06-20 DIAGNOSIS — I429 Cardiomyopathy, unspecified: Secondary | ICD-10-CM | POA: Diagnosis present

## 2016-06-20 LAB — COMPREHENSIVE METABOLIC PANEL
ALBUMIN: 3.2 g/dL — AB (ref 3.5–5.0)
ALT: 24 U/L (ref 17–63)
AST: 28 U/L (ref 15–41)
Alkaline Phosphatase: 63 U/L (ref 38–126)
Anion gap: 9 (ref 5–15)
BUN: 14 mg/dL (ref 6–20)
CHLORIDE: 104 mmol/L (ref 101–111)
CO2: 24 mmol/L (ref 22–32)
CREATININE: 1.11 mg/dL (ref 0.61–1.24)
Calcium: 8.8 mg/dL — ABNORMAL LOW (ref 8.9–10.3)
GFR calc non Af Amer: >60 mL/min (ref >60)
GLUCOSE: 235 mg/dL — AB (ref 65–99)
Potassium: 3.8 mmol/L (ref 3.5–5.1)
SODIUM: 137 mmol/L (ref 135–145)
Total Bilirubin: 0.9 mg/dL (ref 0.3–1.2)
Total Protein: 6.5 g/dL (ref 6.5–8.1)

## 2016-06-20 LAB — BRAIN NATRIURETIC PEPTIDE: B NATRIURETIC PEPTIDE 5: 368.2 pg/mL — AB (ref 0.0–100.0)

## 2016-06-20 LAB — TSH: TSH: 0.281 u[IU]/mL — ABNORMAL LOW (ref 0.350–4.500)

## 2016-06-20 LAB — DIGOXIN LEVEL: Digoxin Level: 0.2 ng/mL — ABNORMAL LOW (ref 0.8–2.0)

## 2016-06-20 MED ORDER — SPIRONOLACTONE 25 MG PO TABS: 12.5000 mg | ORAL_TABLET | Freq: Every day | ORAL | 3 refills | Status: DC

## 2016-06-20 MED FILL — SPIRONOLACTONE 25 MG TABLET: 25 | 34 days supply | Qty: 17 | Fill #0

## 2016-06-20 NOTE — Patient Instructions (Addendum)
Start Entresto 24/26 mg Twice daily   Start Spironolaconet 12.5 mg (1/2 tab) daily  Stop Potassium  Please get an eye exam  Try to lose 10 lbs before your next appointment in 1 month  Your physician has requested that you have a TEE/Cardioversion. During a TEE, sound waves are used to create images of your heart. It provides your doctor with information about the size and shape of your heart and how well your heart's chambers and valves are working. In this test, a transducer is attached to the end of a flexible tube that is guided down you throat and into your esophagus (the tube leading from your mouth to your stomach) to get a more detailed image of your heart. Once the TEE has determined that a blood clot is not present, the cardioversion begins. Electrical Cardioversion uses a jolt of electricity to your heart either through paddles or wired patches attached to your chest. This is a controlled, usually prescheduled, procedure. This procedure is done at the hospital and you are not awake during the procedure. You usually go home the day of the procedure. Please see the instruction sheet given to you today for more information.  PLEASE CALL ME BACK WITH SOME DATES THAT WILL WORK FOR YOU TO HAVE THIS DONE.  Your physician recommends that you schedule a follow-up appointment in: Monday 5/14 with Doroteo Bradford, Pharm D  PLEASE BRING ALL MEDICATION BOTTLES TO THIS APPOINTMENT  Your physician recommends that you schedule a follow-up appointment in: 1 month

## 2016-06-21 NOTE — Progress Notes (Signed)
PCP: Colin Benton, DO  Primary HF Cardiologist:  Dr. [name]  HPI: 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   Admitted 0/3/47-06/04/93 for acute systolic CHF and atrial fibrillation with RVR. Had a R/L heart cath on 04/21/16, no CAD, right heart pressures elevated, low output. Echo showed EF 15%. Plan was to undergo TEE/DC-CV, however he was found to have a left atrial appendage thrombus and had only started Eliquis 5 days prior. He was discharged with plans for TEE/DC-CV in about a month. He was started on Entresto 24/26m BID during admission. Discharged home on Lasix 459mdaily, digoxin 0.12534mand metoprolol XL 54m72mD. Discharge 280 pounds.   Repeat TEE done in 4/18 for possible cardioversion.  EF remained 25-30%.  He still had a small, amorphous LA appendage thrombus (smaller than prior but still present).  He admitted to having missed some Eliquis doses.    Today, weight is up 24 lbs.  He says that he has been eating a lot.  No dyspnea walking on flat ground or with gardening or yardwork. Mild dyspnea with heavy exertion. No chest pain.  No lightheadedness/syncope.  I am not sure that he is taking his meds properly.   ECG (personally reviewed): atrial fibrillation at 97, septal Qs   Cardiac Studies  RHC/LHC 04/21/2016  RA mean 14 RV 45/15 PA 50/28, mean 36 PCWP mean 22 LV 104/20 AO 101/75 Oxygen saturations: PA 59% AO 92% Cardiac Output (Fick) 4.85  Cardiac Index (Fick) 1.89 PVR 2.9 WU  - TEE: 04/2016 with LAA thrombus.  - TEE: 4/18 with EF 25-30%, diffuse hypokinesis, RV mildly dilated with moderately decreased systolic function, amorphous thrombus LA appendage (improved compared to 3/18).   - ECHO 04/24/2016 EF 15% Peak PA pressure 38 mm hg   Labs  04/24/2016: K 4.5 Creatinine 1.00, digoxin < 0.2 4/18: K 4.3,  creatinine 1.02  ROS: All systems negative except as listed in HPI, PMH and Problem List.  SH:  Social History   Social History  . Marital status: Single    Spouse name: N/A  . Number of children: N/A  . Years of education: N/A   Occupational History  . unemployeed    Social History Main Topics  . Smoking status: Never Smoker  . Smokeless tobacco: Never Used  . Alcohol use Yes     Comment: some  . Drug use: No  . Sexual activity: No   Other Topics Concern  . Not on file   Social History Narrative   Admits that he eats poorly and eats a lot at work.      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 Diabetes Father        died in his 70s 16sHypertension Father        died in her 60s 67sSeizures Mother   . Hypertension Sister   . Hypertension Paternal Grandfather   . Heart attack Neg Hx   . Stroke Neg Hx     Past Medical History:  Diagnosis Date  . Atrial fibrillation (HCC)Wing unable to tolerate versed and fentanyl sedation and required gen anesthesia for TEE;  s/p TEE-DCCV (failed);  Amiodarone started  . Chronic diastolic heart  failure ([place])    a. Echo 03/27/11: EF 50-55%, inferior hypokinesis, moderate LAE, mild RVE, normal pulmonary pressures. ;   b.  TEE (04/03/11): EF 40-45%, mild MR moderate LAE, no LAA clot, mild RAE;  c.  Echocardiogram (02/2013): EF 60-65%, Gr 2 DD, mildly dilated Ao root (Ao root dimension 39 mm), MAC, mild LAE, normal RVF  . Diabetes mellitus, type 2 ([place])   . Hx of cardiovascular stress test    Lexiscan Myoview (02/2013): No ischemia or scar, EF 51%, low risk  . Morbid obesity ([place])   . Sleep apnea    a. sleep test 01/2013:  very severy OSA    Current Outpatient Prescriptions  Medication Sig Dispense Refill  . amiodarone (PACERONE) 200 MG tablet Take 1 tablet (200 mg total) by mouth daily. 90 tablet 2  . apixaban (ELIQUIS) 5 MG TABS tablet Take 1 tablet (5  mg total) by mouth 2 (two) times daily. 180 tablet 3  . atorvastatin (LIPITOR) 80 MG tablet Take 1 tablet (80 mg total) by mouth at bedtime. 30 tablet 3  . clindamycin (CLEOCIN) 300 MG capsule Take 1 capsule (300 mg total) by mouth 4 (four) times daily. X 7 days 28 capsule 0  . clotrimazole (LOTRIMIN) 1 % cream Apply topically 2 (two) times daily. Apply to both feet 30 g 3  . digoxin (LANOXIN) 0.125 MG tablet Take 1 tablet (0.125 mg total) by mouth daily. 30 tablet 4  . furosemide (LASIX) 40 MG tablet Take 1 tablet (40 mg total) by mouth daily. 30 tablet 4  . metFORMIN (GLUCOPHAGE) 1000 MG tablet take 1 tablet by mouth twice a day with food 60 tablet 3  . metoprolol succinate (TOPROL-XL) 50 MG 24 hr tablet Take 1 tablet (50 mg total) by mouth 2 (two) times daily. Take with or immediately following a meal. 60 tablet 4  . sacubitril-valsartan (ENTRESTO) 24-26 MG Take 1 tablet by mouth 2 (two) times daily. 60 tablet 11  . spironolactone (ALDACTONE) 25 MG tablet Take 0.5 tablets (12.5 mg total) by mouth daily. 90 tablet 3   No current facility-administered medications for this encounter.     Vitals:   06/20/16 1031  BP: 140/80  Pulse: 68  SpO2: 97%  Weight: 300 lb (136.1 kg)  Height: 6' 2"  (1.88 m)    PHYSICAL EXAM:  General:  Well appearing. No resp difficulty. Walked in the clinic.  HEENT: normal Neck: supple. JVP not elevated.. Carotids 2+ bilaterally; no bruits. No lymphadenopathy or thryomegaly appreciated. Cor: PMI normal. Irregular rate & rhythm. No rubs, gallops or murmurs. Lungs: Clear to auscultation bilaterally.  Abdomen: obese, soft, nontender, nondistended. No hepatosplenomegaly. No bruits or masses. Good bowel sounds. Extremities: no cyanosis, clubbing, rash, edema Neuro: alert & orientedx3, cranial nerves grossly intact. Moves all 4 extremities w/o difficulty. Affect pleasant.  ASSESSMENT & PLAN: 1. Chronic systolic CHF: Had [facility] 03/6832 cors ok. NICM, possibly  tachycardia-mediated in the setting of rapid atrial fibrillation. HR is now better controlled, in the 90s today but still in atrial fibrillation.  Echo 04/2016 EF 15%. Despite weight gain, he does not appear volume overloaded.  NYHA class II.  There is some confusion about which meds he is taking.  - Start Entresto 24/26 bid.  BMET today and in 10 days.   - Continue digoxin 0.172m daily, check digoxin level.  - Add spironolactone 12.5 mg daily and stop KCl.  - Continue metoprolol 580mXL BID - Continue Lasix 40 mg daily.  2.  Atrial fibrillation: Persistent afib. CHA2DS2-VASc Score 3 and unadjusted Ischemic Stroke Rate (% 5.9.per year) .   Continue amiodarone 200 mg daily. Check LFTs, TSH today. He will need regular eye exam.  - Continue Eliquis for anticoagulation. No bleeding problems.  - He has not missed any Eliquis since last TEE.  It has been a month or so since last TEE attempt showed persistent (though significantly smaller) LA appendage thrombus.  I will arrange repeat attempt at TEE-guided DCCV. We discussed risks/benefits and he agrees to proceed.  3. HLD: on statin 4. OSA: He has not been using CPAP.  We discussed benefits from CPAP, and I encouraged him to be compliant with it.  5. Obesity: I think that his weight gain is caloric in nature.  We discussed diet and exercise for weight loss.    Followup with our pharmacist next week to go over meds.  He will bring them with him.   Loralie Champagne  06/21/2016

## 2016-06-23 ENCOUNTER — Inpatient Hospital Stay (HOSPITAL_COMMUNITY): Admission: RE | Admit: 2016-06-23 | Payer: Self-pay | Source: Ambulatory Visit

## 2016-06-26 ENCOUNTER — Telehealth: Payer: Self-pay | Admitting: Licensed Clinical Social Worker

## 2016-06-26 NOTE — Telephone Encounter (Signed)
CSW received referral from pharmacy to assist with financial/insurance and refer to paramedicine.. CSW attempted to reach patient by phone with no success. CSW will refer to paramedicine to attempt home visit and encourage clinic visit follow up. Raquel Sarna, [place], [place]

## 2016-06-30 ENCOUNTER — Telehealth: Payer: Self-pay | Admitting: Licensed Clinical Social Worker

## 2016-06-30 NOTE — Telephone Encounter (Signed)
CSW left message for patient for return call. Patient referred to paramedicine to follow up and make home visit to have patient return call. Patient was a no show as well last week. CSW continues to follow. Raquel Sarna, [place], [place]

## 2016-07-01 ENCOUNTER — Telehealth (HOSPITAL_COMMUNITY): Payer: Self-pay | Admitting: Surgery

## 2016-07-01 NOTE — Telephone Encounter (Signed)
Mr. Zinn was referred to the HF Community Paramedicine program.  He refused during hospital admission however has been re-referred after clinic visit.   I have sent the appropriate paperwork via secure email to the [employer].

## 2016-07-04 ENCOUNTER — Other Ambulatory Visit (HOSPITAL_COMMUNITY): Payer: Self-pay

## 2016-07-04 NOTE — Progress Notes (Signed)
Paramedicine Encounter    Patient ID: Darius [name], male    DOB: [birth year], 61 y.o.   MRN: [id number]   Patient Care Team: [name], DO as PCP - General (Family Medicine) [name], MD as Consulting Physician (Cardiology)  Patient Active Problem List   Diagnosis Date Noted  . Atrial fibrillation with RVR ([place]) 04/17/2016  . Elevated troponin 04/17/2016  . CHF (congestive heart failure) ([place]) 04/17/2016  . Septic olecranon bursitis of right elbow 09/19/2015  . Lactic acid acidosis   . Dilated cardiomyopathy ([place]) 04/19/2014  . Type 2 diabetes mellitus ([place]) 04/09/2014  . Non compliance with medical treatment and diet 04/09/2014  . Chronic anticoagulation-Eliquis started 04/06/14 04/09/2014  . Abnormal chest CT-LUL nodule- needs f/u May 2016 04/09/2014  . Acute on chronic combined systolic and diastolic heart failure ([place]) 04/05/2014  . OSA - C-pap 03/22/2013  . Persistent atrial fibrillation ([place]) 03/27/2011  . Obesity, Class III, BMI 40-49.9 (morbid obesity) ([place]) 03/27/2011  . URI, acute 03/27/2011    Current Outpatient Prescriptions:  .  amiodarone (PACERONE) 200 MG tablet, Take 1 tablet (200 mg total) by mouth daily., Disp: 90 tablet, Rfl: 2 .  apixaban (ELIQUIS) 5 MG TABS tablet, Take 1 tablet (5 mg total) by mouth 2 (two) times daily., Disp: 180 tablet, Rfl: 3 .  atorvastatin (LIPITOR) 80 MG tablet, Take 1 tablet (80 mg total) by mouth at bedtime., Disp: 30 tablet, Rfl: 3 .  digoxin (LANOXIN) 0.125 MG tablet, Take 1 tablet (0.125 mg total) by mouth daily., Disp: 30 tablet, Rfl: 4 .  furosemide (LASIX) 40 MG tablet, Take 1 tablet (40 mg total) by mouth daily., Disp: 30 tablet, Rfl: 4 .  metoprolol succinate (TOPROL-XL) 50 MG 24 hr tablet, Take 1 tablet (50 mg total) by mouth 2 (two) times daily. Take with or immediately following a meal., Disp: 60 tablet, Rfl: 4 .  sacubitril-valsartan (ENTRESTO) 24-26 MG, Take 1 tablet by mouth 2 (two) times daily., Disp: 60  tablet, Rfl: 11 .  spironolactone (ALDACTONE) 25 MG tablet, Take 0.5 tablets (12.5 mg total) by mouth daily., Disp: 90 tablet, Rfl: 3 .  clindamycin (CLEOCIN) 300 MG capsule, Take 1 capsule (300 mg total) by mouth 4 (four) times daily. X 7 days (Patient not taking: Reported on 07/04/2016), Disp: 28 capsule, Rfl: 0 .  clotrimazole (LOTRIMIN) 1 % cream, Apply topically 2 (two) times daily. Apply to both feet (Patient not taking: Reported on 07/04/2016), Disp: 30 g, Rfl: 3 .  metFORMIN (GLUCOPHAGE) 1000 MG tablet, take 1 tablet by mouth twice a day with food (Patient not taking: Reported on 07/04/2016), Disp: 60 tablet, Rfl: 3 No Known Allergies   Social History   Social History  . Marital status: Single    Spouse name: N/A  . Number of children: N/A  . Years of education: N/A   Occupational History  . unemployeed    Social History Main Topics  . Smoking status: Never Smoker  . Smokeless tobacco: Never Used  . Alcohol use Yes     Comment: some  . Drug use: No  . Sexual activity: No   Other Topics Concern  . Not on file   Social History Narrative   Admits that he eats poorly and eats a lot at work.      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    Physical Exam  Constitutional: He is oriented to person, place, and time.  Neck: Normal range of motion.  JVD present.  Cardiovascular: Normal rate.  An irregular rhythm present.  Pulmonary/Chest: [place] normal and breath sounds normal. No respiratory distress. He has no wheezes. He has no rales.  Abdominal: Soft.  Musculoskeletal: Normal range of motion. He exhibits edema.  Neurological: He is alert and oriented to person, place, and time.  Skin: Skin is warm and dry.        Future Appointments Date Time Provider [place]  07/09/2016 1:15 PM [facility] PHARMACY [facility] None  07/21/2016 11:30 AM [facility] PA/NP [facility] None   BP (!) 130/94 (BP Location: Right Arm, Patient Position: Sitting, Cuff Size:  Normal)   Pulse 70   Resp 18   SpO2 97%  Weight yesterday- Did not weigh Last visit weight- N/A CBG- 133 mg/dl  Mr Mcfadden was seen at [facility] today and reports feeling well. He has not weighed himself consistently and I did not have a scale in the car to weigh him today at B6. He said he was going to go home, weigh and report back to me but he did not. He arrived with a bag of medications and not sure which he was supposed to be taking and which were expired. He said he has been keeping old medications "in case he needs them again sometime." I explained to him that his old medications were expired and trying to use them in the future was not advised. He was given a pill box and it was filled today however he did not have metformin and has been out for over a week. He does not have a prescribing physician for it at this time so he was told to call the [facility] and make an appointment. He remains reluctant to meet at his house and I suspect it is due to sanitary concerns. He reports having "boxes all over the floor full of stuff" but says they are not a trip hazard to him because he "knows where they are and can get through." The Living Environment Survey and SAFE flow sheets were completed based off Mr. Malay' own reporting, though I suspect the living condition inside his home is very poor. Mr. Monds is still willing to meet weekly as long as we meet away from his house.  Jacquiline Doe, EMT 07/04/16  ACTION: Home visit completed Next visit planned for 1 week

## 2016-07-09 ENCOUNTER — Ambulatory Visit (HOSPITAL_COMMUNITY)
Admission: RE | Admit: 2016-07-09 | Discharge: 2016-07-09 | Disposition: A | Discharge status: Home / Self Care | Payer: Medicaid Other | Source: Ambulatory Visit | Attending: Cardiology | Admitting: Cardiology

## 2016-07-09 ENCOUNTER — Other Ambulatory Visit (HOSPITAL_COMMUNITY): Payer: Self-pay

## 2016-07-09 VITALS — BP 124/78 | HR 94 | Wt 293.2 lb

## 2016-07-09 DIAGNOSIS — E119 Type 2 diabetes mellitus without complications: Secondary | ICD-10-CM | POA: Insufficient documentation

## 2016-07-09 DIAGNOSIS — E669 Obesity, unspecified: Secondary | ICD-10-CM | POA: Insufficient documentation

## 2016-07-09 DIAGNOSIS — G4733 Obstructive sleep apnea (adult) (pediatric): Secondary | ICD-10-CM | POA: Diagnosis not present

## 2016-07-09 DIAGNOSIS — I5043 Acute on chronic combined systolic (congestive) and diastolic (congestive) heart failure: Secondary | ICD-10-CM

## 2016-07-09 DIAGNOSIS — I5022 Chronic systolic (congestive) heart failure: Secondary | ICD-10-CM | POA: Diagnosis not present

## 2016-07-09 DIAGNOSIS — Z9114 Patient's other noncompliance with medication regimen: Secondary | ICD-10-CM | POA: Diagnosis not present

## 2016-07-09 DIAGNOSIS — Z7984 Long term (current) use of oral hypoglycemic drugs: Secondary | ICD-10-CM | POA: Insufficient documentation

## 2016-07-09 DIAGNOSIS — E785 Hyperlipidemia, unspecified: Secondary | ICD-10-CM | POA: Insufficient documentation

## 2016-07-09 DIAGNOSIS — Z7901 Long term (current) use of anticoagulants: Secondary | ICD-10-CM | POA: Insufficient documentation

## 2016-07-09 DIAGNOSIS — I481 Persistent atrial fibrillation: Secondary | ICD-10-CM | POA: Insufficient documentation

## 2016-07-09 LAB — BASIC METABOLIC PANEL
ANION GAP: 8 (ref 5–15)
BUN: 16 mg/dL (ref 6–20)
CALCIUM: 8.9 mg/dL (ref 8.9–10.3)
CO2: 24 mmol/L (ref 22–32)
Chloride: 106 mmol/L (ref 101–111)
Creatinine, Ser: 1.08 mg/dL (ref 0.61–1.24)
GFR calc non Af Amer: >60 mL/min (ref >60)
Glucose, Bld: 114 mg/dL — ABNORMAL HIGH (ref 65–99)
Potassium: 4.1 mmol/L (ref 3.5–5.1)
SODIUM: 138 mmol/L (ref 135–145)

## 2016-07-09 LAB — BRAIN NATRIURETIC PEPTIDE: B NATRIURETIC PEPTIDE 5: 114.1 pg/mL — AB (ref 0.0–100.0)

## 2016-07-09 LAB — TSH: TSH: 0.421 u[IU]/mL (ref 0.350–4.500)

## 2016-07-09 LAB — T4, FREE: FREE T4: 1.11 ng/dL (ref 0.61–1.12)

## 2016-07-09 MED ORDER — SPIRONOLACTONE 25 MG PO TABS
25.0000 mg | ORAL_TABLET | Freq: Every day | ORAL | 2 refills | Status: DC
Start: 2016-07-09 — End: 2016-07-21

## 2016-07-09 NOTE — Progress Notes (Signed)
CSW requested to assist patient with insurance options. Patient currently receives Social Security benefits as he is age 61 although not eligible for Medicare until age 24. Patient does not appear to meet qualifications for medicaid as he is not considered disable and appears to be over the asset level. Patient states he previously went to [facility] clinic for PCP but has not been in sometime. CSW contacted scheduling line and informed patient will need to call on Friday morning at 8:30am for an appointment to reestablish care. Patient will then be able to meet with financial counselor to determine eligibility for [facility]. Patient verbalizes understanding of follow up needed and will reach out to CSW if needed. Raquel Sarna, [place], [place]

## 2016-07-09 NOTE — Progress Notes (Signed)
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   Admitted 06/17/30-03/14/52 for acute systolic CHF and atrial fibrillation with RVR. Had a R/L heart cath on 04/21/16, no CAD, right heart pressures elevated, low output. Echo showed EF 15%. Plan was to undergo TEE/DC-CV, however he was found to have a left atrial appendage thrombus and had only started Eliquis 5 days prior. He was discharged with plans for TEE/DC-CV in about a month. He was started on Entresto 24/26mg  BID during admission. Discharged home on Lasix 40mg  daily, digoxin 0.125mg , and metoprolol XL 50mg  BID. Discharge 280 pounds.   Repeat TEE done in 4/18 for possible cardioversion.  EF remained 25-30%.  He still had a small, amorphous LA appendage thrombus (smaller than prior but still present).  He admitted to having missed some Eliquis doses.    He returns today for pharmacist-led HF medication titration. At his last visit on 06/20/16, his KCl was discontinued, Entresto 24/26 mg BID was restarted and spironolactone 12.5 mg daily was started. He was also started in the paramedicine program and was seen last Friday at which point his pillbox was filled for him. Only missed Monday evening's doses 2/2 falling asleep early. Today he feels well, weight starting to trend down again. He still admits to eating the wrong foods (i.e [place] for dinner yesterday) but knows he needs to start eating lower sodium foods. Still no dyspnea walking on flat ground or with gardening or yardwork. Mild dyspnea with heavy exertion. No chest pain.  No lightheadedness/syncope.   . Shortness of breath/dyspnea on exertion? no  . Orthopnea/PND? no . Edema? no . Lightheadedness/dizziness? no . Daily weights at home? Yes - today weight was 298 lb at home . Blood pressure/heart rate  monitoring at home? no . Following low-sodium/fluid-restricted diet? No - still eating high sodium foods  HF Medications: Digoxin 0.125 mg PO daily Furosemide 40 mg PO daily Metoprolol succinate 50 mg PO BID Entresto 24-26 mg PO BID Spironolactone 12.5 mg PO daily   Has the patient been experiencing any side effects to the medications prescribed?  no  Does the patient have any problems obtaining medications due to transportation or finances?   Yes - no insurance but set up with Eliquis and Entresto patient assistance programs although has not been able to contact the companies to verify shipping address - will try to call them myself  Understanding of regimen: poor Understanding of indications: poor Potential of compliance: fair Patient understands to avoid NSAIDs. Patient understands to avoid decongestants.    Pertinent Lab Values: . 07/09/16: Serum creatinine 1.08, BUN 16, Potassium 4.1, Sodium 138, BNP 114 . 06/20/16: Digoxin 0.2   Vital Signs: . Weight: 293.2 lb (last HF clinic visit weight: 300 lb) . Blood pressure: 124/78 mmHg  . Heart rate: 94 bpm   Assessment: 1. Chronicsystolic CHF (EF 27-06% on ECHO 05/26/16), due to NICM (possibly tachycardia-mediated in setting of afib with RVR). NYHA class IIsymptoms.  - Volume status stable and weight starting to trend back down  - Increase spironolactone to goal 25 mg daily  - Continue digoxin 0.125 mg daily, furosemide 40 mg daily, metoprolol succinate 50 mg bid and Entresto 24-26 mg BID  - Darius [name] (paramedicine) filled pillbox at visit today with appropriate changes - Basic disease state pathophysiology, medication  indication, mechanism and side effects reviewed at length with patient and he verbalized understanding 2. Persistent atrial fibrillation (CHADS2VASC 3)  - Continue amiodarone 200 mg daily and Eliquis 5 mg BID  - LFTs on 06/20/16 wnl and TSH/T3/T4 on 07/09/16 wnl  - Plans for repeat TEE-guided DCCV soon  3. HLD  - Last  LP on 04/18/16 good - LDL 78, HDL 35, TG 86  - Continue atorvastatin 80 mg daily 4. OSA  - Reminded about importance of compliance with CPAP 5. Obesity  - Discussed importance of diet and exercise 6. DMII  - Not compliant with metformin  - Darius [name] seeing today to get him back in with Darius [name]: 1) Medication changes: Based on clinical presentation, vital signs and recent labs will increase spironolactone to 25 mg daily 2) Labs: BMET/BNP/TSH today 3) Follow-up: Darius Kilts, PA-C on 07/21/16    Ruta Hinds. Velva Harman, PharmD, BCPS, CPP Clinical Pharmacist Pager: [phone] Phone: [phone] 07/09/2016 1:41 PM

## 2016-07-09 NOTE — Patient Instructions (Addendum)
It was great to see you today!  Please INCREASE your spironolactone to 25 mg (1 tablet) DAILY.   Labs today. We will call you with any abnormalities.  Please keep your appointment with Oda Kilts, PA-C on 07/21/16.    Please let us know what day you could do your cardioversion, here are some possible dates:  Thur 6/21  Tue 6/26  Wed 6/27

## 2016-07-09 NOTE — Progress Notes (Signed)
Paramedicine Encounter   Patient ID: Darius [name] , male,   DOB: 1955/12/17,60 y.o.,  MRN: [id number]   Met patient in clinic today with provider Doroteo Bradford.  Doroteo Bradford went over pts medications with him today.  He brought the pill box that Darius [name] had fill and his medications.  He had missed one evening dose on Sunday but had no issues with picking up where he left off.  He denies sob, dizziness, headache and chest pain.    Pt's pill box revised and filled today with 58m spirolactone.   EDoroteo Bradfordgave samples: eliquis, entresto. She will fill paperwork for program.  Pt stated that he will get the refill that was called in from the out pt pharmacy.    Time spent with patient 30 mins  *rx called in: **increased spirolactone to whole pill 252m Darius [name], EMT-Paramedic 07/09/2016   ACTION: [facility]

## 2016-07-10 LAB — T3, FREE: T3, Free: 3.6 pg/mL (ref 2.0–4.4)

## 2016-07-11 ENCOUNTER — Telehealth (HOSPITAL_COMMUNITY): Payer: Self-pay

## 2016-07-11 NOTE — Addendum Note (Signed)
Addendum  created 07/11/16 1347 by Melbourne Jakubiak, MD   Sign clinical note    

## 2016-07-11 NOTE — Telephone Encounter (Signed)
Mr Soderquist was scheduled to meet me today but forgot about the meeting. He was at the clinic earlier this week and [facility] made the necessary changes to his box. Upon speaking with him, he said he could wait and see me next week because his pillbox was full from [place]. Planned next visit for 13:00 at [facility].

## 2016-07-11 NOTE — Addendum Note (Signed)
Addendum  created 07/11/16 1223 by Brienne Liguori, MD   Sign clinical note    

## 2016-07-18 ENCOUNTER — Other Ambulatory Visit (HOSPITAL_COMMUNITY): Payer: Self-pay

## 2016-07-18 MED FILL — SPIRONOLACTONE 25 MG TABLET: 25 | 34 days supply | Qty: 17 | Fill #1

## 2016-07-18 NOTE — Progress Notes (Signed)
Paramedicine Encounter    Patient ID: Darius [name], male    DOB: Nov 25, 1955, 61 y.o.   MRN: [id number]   Patient Care Team: Darius Kern, DO as PCP - General (Family Medicine) Darius Dresser, MD as Consulting Physician (Cardiology)  Patient Active Problem List   Diagnosis Date Noted  . Atrial fibrillation with RVR ([place]) 04/17/2016  . Elevated troponin 04/17/2016  . CHF (congestive heart failure) ([place]) 04/17/2016  . Septic olecranon bursitis of right elbow 09/19/2015  . Lactic acid acidosis   . Dilated cardiomyopathy ([place]) 04/19/2014  . Type 2 diabetes mellitus ([place]) 04/09/2014  . Non compliance with medical treatment and diet 04/09/2014  . Chronic anticoagulation-Eliquis started 04/06/14 04/09/2014  . Abnormal chest CT-LUL nodule- needs f/u May 2016 04/09/2014  . Acute on chronic combined systolic and diastolic heart failure ([place]) 04/05/2014  . OSA - C-pap 03/22/2013  . Persistent atrial fibrillation ([place]) 03/27/2011  . Obesity, Class III, BMI 40-49.9 (morbid obesity) ([place]) 03/27/2011  . URI, acute 03/27/2011    Current Outpatient Prescriptions:  .  amiodarone (PACERONE) 200 MG tablet, Take 1 tablet (200 mg total) by mouth daily., Disp: 90 tablet, Rfl: 2 .  apixaban (ELIQUIS) 5 MG TABS tablet, Take 1 tablet (5 mg total) by mouth 2 (two) times daily., Disp: 180 tablet, Rfl: 3 .  atorvastatin (LIPITOR) 80 MG tablet, Take 1 tablet (80 mg total) by mouth at bedtime., Disp: 30 tablet, Rfl: 3 .  digoxin (LANOXIN) 0.125 MG tablet, Take 1 tablet (0.125 mg total) by mouth daily., Disp: 30 tablet, Rfl: 4 .  furosemide (LASIX) 40 MG tablet, Take 1 tablet (40 mg total) by mouth daily., Disp: 30 tablet, Rfl: 4 .  metoprolol succinate (TOPROL-XL) 50 MG 24 hr tablet, Take 1 tablet (50 mg total) by mouth 2 (two) times daily. Take with or immediately following a meal., Disp: 60 tablet, Rfl: 4 .  sacubitril-valsartan (ENTRESTO) 24-26 MG, Take 1 tablet by mouth 2 (two) times daily., Disp: 60  tablet, Rfl: 11 .  spironolactone (ALDACTONE) 25 MG tablet, Take 1 tablet (25 mg total) by mouth daily., Disp: 30 tablet, Rfl: 2 .  metFORMIN (GLUCOPHAGE) 1000 MG tablet, take 1 tablet by mouth twice a day with food (Patient not taking: Reported on 07/04/2016), Disp: 60 tablet, Rfl: 3 No Known Allergies   Social History   Social History  . Marital status: Single    Spouse name: N/A  . Number of children: N/A  . Years of education: N/A   Occupational History  . unemployeed    Social History Main Topics  . Smoking status: Never Smoker  . Smokeless tobacco: Never Used  . Alcohol use Yes     Comment: some  . Drug use: No  . Sexual activity: No   Other Topics Concern  . Not on file   Social History Narrative   Admits that he eats poorly and eats a lot at work.      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    Physical Exam  Constitutional: He is oriented to person, place, and time. He appears well-developed.  Neck: Normal range of motion.  Cardiovascular: Normal rate.  An irregular rhythm present.  Pulmonary/Chest: Effort normal and breath sounds normal. No respiratory distress. He has no wheezes. He has no rales.  Abdominal: Soft. He exhibits no distension. There is no tenderness.  Musculoskeletal: Normal range of motion. He exhibits no edema.  Neurological: He is alert and oriented to person,  place, and time.  Skin: Skin is warm and dry.        SAFE - 07/04/16 1400      Situation   Admitting diagnosis CHF   Heart failure history Exisiting   Comorbidities DM;HTN;Sleep Apnea   Readmitted within 30 days No   Hospital admission within past 12 months Yes   number of hospital admissions 3   number of ED visits 3     Assessment   Lives alone Yes   Primary support person None   Mode of transportation personal car   Other services involved None   Home equipement Scale     Weight   Weighs self daily Yes   Scale provided No   Records on weight  chart Yes     Resources   Has "Living better w/heart failure" book Yes   Has HF Zone tool Yes   Able to identify yellow zone signs/when to call MD No   Records zone daily No     Medications   Uses a pill box Yes   Who stocks the pill box paramedic   Pill box checked this visit Yes   Pill box refilled this visit Yes   Difficulty obtaining medications No   Mail order medications No   Missed one or more doses of medications per week Yes   How many missed doses this week 7     Nutrition   Patient receives meals on wheels No   Patient follows low sodium diet No   Has foods at home that meet the current recommended diet No   Patient follows low sugar/card diet No   Nutritional concerns/issues Yes  Low income      Activity Level   ADL's/Mobility Independent   How many feet can patient ambulate >50 feet   Typical activity level Sedentary   Barriers None     Urine   Difficulty urinating No   Changes in urine None     Time spent with patient   Time spent with patient  75 Minutes        Future Appointments Date Time Provider [place]  07/21/2016 11:30 AM [facility] PA/NP [facility] None   BP 108/70 (BP Location: Right Arm, Patient Position: Sitting, Cuff Size: Normal)   Pulse 68   Resp 18   Wt 292 lb 6.4 oz (132.6 kg)   SpO2 97%   BMI 37.54 kg/m  Weight yesterday- 298 lbs Last visit weight- N/A CBG- 206 mg/dL  Mr Darius [name] s was [facility]. He is still reluctant to let paramedicine come to his house. 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 I expressed my concerns about this with him, specifically the health and safety risks it poses. He said his sister has tried to clean out his house for him in the past but he just kept bringing things back inside. He reports eating only what he can cook in the microwave or toaster over because he doesn't want to wait for things to cook or clean up after he finishes cooking. He also admits  to eating out often. His medications were verified and his pillbox was refilled. He denied SOB, H/A or dizziness.   Darius [name], EMT 07/18/16  ACTION: Home visit completed Next visit planned for Monday

## 2016-07-21 ENCOUNTER — Encounter (HOSPITAL_COMMUNITY): Payer: Self-pay

## 2016-07-21 ENCOUNTER — Other Ambulatory Visit (HOSPITAL_COMMUNITY): Payer: Self-pay

## 2016-07-21 ENCOUNTER — Ambulatory Visit (HOSPITAL_COMMUNITY)
Admission: RE | Admit: 2016-07-21 | Discharge: 2016-07-21 | Disposition: A | Discharge status: Home / Self Care | Payer: Medicaid Other | Source: Ambulatory Visit | Attending: Internal Medicine | Admitting: Internal Medicine

## 2016-07-21 VITALS — BP 144/88 | HR 78 | Wt 300.2 lb

## 2016-07-21 DIAGNOSIS — Z79899 Other long term (current) drug therapy: Secondary | ICD-10-CM | POA: Insufficient documentation

## 2016-07-21 DIAGNOSIS — Z7984 Long term (current) use of oral hypoglycemic drugs: Secondary | ICD-10-CM | POA: Diagnosis not present

## 2016-07-21 DIAGNOSIS — I481 Persistent atrial fibrillation: Secondary | ICD-10-CM | POA: Diagnosis not present

## 2016-07-21 DIAGNOSIS — E785 Hyperlipidemia, unspecified: Secondary | ICD-10-CM | POA: Diagnosis not present

## 2016-07-21 DIAGNOSIS — I5022 Chronic systolic (congestive) heart failure: Secondary | ICD-10-CM | POA: Insufficient documentation

## 2016-07-21 DIAGNOSIS — Z8249 Family history of ischemic heart disease and other diseases of the circulatory system: Secondary | ICD-10-CM | POA: Diagnosis not present

## 2016-07-21 DIAGNOSIS — E119 Type 2 diabetes mellitus without complications: Secondary | ICD-10-CM | POA: Diagnosis not present

## 2016-07-21 DIAGNOSIS — Z91199 Patient's noncompliance with other medical treatment and regimen due to unspecified reason: Secondary | ICD-10-CM

## 2016-07-21 DIAGNOSIS — I428 Other cardiomyopathies: Secondary | ICD-10-CM | POA: Insufficient documentation

## 2016-07-21 DIAGNOSIS — Z6838 Body mass index (BMI) 38.0-38.9, adult: Secondary | ICD-10-CM | POA: Insufficient documentation

## 2016-07-21 DIAGNOSIS — E669 Obesity, unspecified: Secondary | ICD-10-CM | POA: Insufficient documentation

## 2016-07-21 DIAGNOSIS — G4733 Obstructive sleep apnea (adult) (pediatric): Secondary | ICD-10-CM | POA: Diagnosis not present

## 2016-07-21 DIAGNOSIS — I480 Paroxysmal atrial fibrillation: Secondary | ICD-10-CM

## 2016-07-21 DIAGNOSIS — Z7901 Long term (current) use of anticoagulants: Secondary | ICD-10-CM | POA: Insufficient documentation

## 2016-07-21 DIAGNOSIS — I509 Heart failure, unspecified: Secondary | ICD-10-CM

## 2016-07-21 DIAGNOSIS — Z833 Family history of diabetes mellitus: Secondary | ICD-10-CM | POA: Diagnosis not present

## 2016-07-21 DIAGNOSIS — Z9119 Patient's noncompliance with other medical treatment and regimen: Secondary | ICD-10-CM

## 2016-07-21 DIAGNOSIS — I4819 Other persistent atrial fibrillation: Secondary | ICD-10-CM

## 2016-07-21 MED ORDER — SACUBITRIL-VALSARTAN 49-51 MG PO TABS: 1.0000 | ORAL_TABLET | Freq: Two times a day (BID) | ORAL | 11 refills | Status: DC

## 2016-07-21 MED ORDER — SPIRONOLACTONE 25 MG PO TABS: 25.0000 mg | ORAL_TABLET | Freq: Every day | ORAL | 6 refills | Status: DC

## 2016-07-21 NOTE — Addendum Note (Signed)
Encounter addended by: Louann Liv, LCSW on: 07/21/2016  1:37 PM<BR>    Actions taken: Sign clinical note

## 2016-07-21 NOTE — Progress Notes (Signed)
Paramedicine Encounter   Patient ID: Darius [name] , male,   DOB: 09/15/1955,60 y.o.,  MRN: [id number]   Met patient in clinic today with provider. Darius [name] has an increase in his Entresto per [place]. PA-C. He denied SOB, H/A, dizziness or orthopnea. Vital signs take by clinic staff. Medications were verified by pharmacy and paramedicine refilled his pillbox. Paramedicine and Kennyth Lose spoke with Darius [name] about allowing in home visits due to concerns over his living environment but he continued to refuse.  Time spent with patient 30 min   Jacquiline Doe, EMT-Paramedic 07/21/2016   ACTION: Next visit planned for 1 week

## 2016-07-21 NOTE — Patient Instructions (Addendum)
Refill of Spironolactone has been sent to [facility].  Please call [phone] to fill your ENTRESTO.  INCREASE Dose to 49/51 mg tablets: Take 1 tab twice daily.  Please call [phone] to fill your ELIQUIS.   You have been scheduled for a cardioversion with transesophageal echocardiogram. See instruction sheet for additional details.  Follow up 6 weeks with Darius Kilts PA-C. Take all medication as prescribed the day of your appointment. Bring all medications with you to your appointment.  Do the following things EVERYDAY: 1) Weigh yourself in the morning before breakfast. Write it down and keep it in a log. 2) Take your medicines as prescribed 3) Eat low salt foods-Limit salt (sodium) to 2000 mg per day.  4) Stay as active as you can everyday 5) Limit all fluids for the day to less than 2 liters

## 2016-07-21 NOTE — Progress Notes (Signed)
CSW and Paramedic met with patient in the clinic. Patient does not want paramedic to visit inside his home. Paramedic reported that patient appears to be a hoarder based on the contents of his car and that patient barely opened front door and slid out to meet paramedic on first home visit. CSW discussed importance of meeting with patient in the home as paramedic is better able to assess home environment and assist with tips for improved heart failure management by seeing the home. CSW tried to assure patient no judgement and provide additional supportive intervention. Patient verbalizes understanding of the benefits of paramedicine although would like to continue to meet in community for the moment. CSW will continue to provide supportive intervention and build a trusting relationship. [place] conference with [facility] paramedic and both will continue to follow and coordinate care. Raquel Sarna, [place], [place]

## 2016-07-21 NOTE — Progress Notes (Signed)
Advanced Heart Failure Medication Review by a Pharmacist  Does the patient  feel that his/her medications are working for him/her?  yes  Has the patient been experiencing any side effects to the medications prescribed?  no  Does the patient measure his/her own blood pressure or blood glucose at home?  no   Does the patient have any problems obtaining medications due to transportation or finances?   no  Understanding of regimen: good Understanding of indications: good Potential of compliance: good Patient understands to avoid NSAIDs. Patient understands to avoid decongestants.  Issues to address at subsequent visits: none   Pharmacist comments: Darius [name] is a pleasant 61 yo male presenting with pill bottles and pill box. His most recent spironolactone bottle still has directions "take 1/2 tab (12.5 mg) once daily" but he is aware this was increased to 25 mg daily and is taking it accordingly. Per patient, has not been taking metformin as this has not arrived in the mail. Patient does not check CBG or BP at home. Patient denies questions or other issues with medication.     Time with patient: 10 mins Preparation and documentation time: 3 mins Total time: 18 mins

## 2016-07-21 NOTE — Progress Notes (Signed)
Advanced Heart Failure Clinic Note   PCP: Colin Benton, DO  Primary HF Cardiologist:  Dr. [name]  HPI: Mr. 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   Admitted 10/19/33-08/11/75 for acute systolic CHF and atrial fibrillation with RVR. Had a R/L heart cath on 04/21/16, no CAD, right heart pressures elevated, low output. Echo showed EF 15%. Plan was to undergo TEE/DC-CV, however he was found to have a left atrial appendage thrombus and had only started Eliquis 5 days prior. He was discharged with plans for TEE/DC-CV in about a month. He was started on Entresto 24/51m BID during admission. Discharged home on Lasix 445mdaily, digoxin 0.12569mand metoprolol XL 57m22mD. Discharge 280 pounds.   Repeat TEE done in 4/18 for possible cardioversion.  EF remained 25-30%.  He still had a small, amorphous LA appendage thrombus (smaller than prior but still present).  He admitted to having missed some Eliquis doses.    He presents today for regular follow up.  At last (pharm) visit, spironolactone increased. Weight up 7 lbs from last visit. Breathing has been fine.  Weight at home 292 on Friday. Ate a half of a rotisserie chicken over the weekend. Not watching fluid or salt. Denies lightheadedness or dizziness. No CP.  Not taking metformin. Taking lasix 40 mg daily. Denies bleeding on eliquis. No SOB with mowing the yard (push mower)  Cardiac Studies  RHC/LHC 04/21/2016  RA mean 14 RV 45/15 PA 50/28, mean 36 PCWP mean 22 LV 104/20 AO 101/75 Oxygen saturations: PA 59% AO 92% Cardiac Output (Fick) 4.85  Cardiac Index (Fick) 1.89 PVR 2.9 WU  - TEE: 04/2016 with LAA thrombus.  - TEE: 4/18 with EF 25-30%, diffuse hypokinesis, RV mildly dilated with moderately decreased systolic function, amorphous thrombus LA appendage (improved compared to 3/18).   -  ECHO 04/24/2016 EF 15% Peak PA pressure 38 mm hg   Labs  04/24/2016: K 4.5 Creatinine 1.00, digoxin < 0.2 4/18: K 4.3, creatinine 1.02  Review of systems complete and found to be negative unless listed in HPI.    SH:  Social History   Social History  . Marital status: Single    Spouse name: N/A  . Number of children: N/A  . Years of education: N/A   Occupational History  . unemployeed    Social History Main Topics  . Smoking status: Never Smoker  . Smokeless tobacco: Never Used  . Alcohol use Yes     Comment: some  . Drug use: No  . Sexual activity: No   Other Topics Concern  . Not on file   Social History Narrative   Admits that he eats poorly and eats a lot at work.      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 Diabetes Father        died in his 70s 27sHypertension Father        died in her 60s 47sSeizures Mother   . Hypertension Sister   . Hypertension Paternal Grandfather   . Heart attack Neg Hx   . Stroke Neg Hx     Past Medical History:  Diagnosis Date  . Atrial fibrillation (HCC)[place] unable to tolerate versed and fentanyl  sedation and required gen anesthesia for TEE;  s/p TEE-DCCV (failed);  Amiodarone started  . Chronic diastolic heart failure ([place])    a. Echo 03/27/11: EF 50-55%, inferior hypokinesis, moderate LAE, mild RVE, normal pulmonary pressures. ;   b.  TEE (04/03/11): EF 40-45%, mild MR moderate LAE, no LAA clot, mild RAE;  c.  Echocardiogram (02/2013): EF 60-65%, Gr 2 DD, mildly dilated Ao root (Ao root dimension 39 mm), MAC, mild LAE, normal RVF  . Diabetes mellitus, type 2 ([place])   . Hx of cardiovascular stress test    Lexiscan Myoview (02/2013): No ischemia or scar, EF 51%, low risk  . Morbid obesity ([place])   . Sleep apnea    a. sleep test 01/2013:  very severy OSA    Current Outpatient Prescriptions  Medication Sig Dispense Refill  . amiodarone (PACERONE) 200  MG tablet Take 1 tablet (200 mg total) by mouth daily. 90 tablet 2  . apixaban (ELIQUIS) 5 MG TABS tablet Take 1 tablet (5 mg total) by mouth 2 (two) times daily. 180 tablet 3  . atorvastatin (LIPITOR) 80 MG tablet Take 1 tablet (80 mg total) by mouth at bedtime. 30 tablet 3  . digoxin (LANOXIN) 0.125 MG tablet Take 1 tablet (0.125 mg total) by mouth daily. 30 tablet 4  . furosemide (LASIX) 40 MG tablet Take 1 tablet (40 mg total) by mouth daily. 30 tablet 4  . Krill Oil (OMEGA-3) 500 MG CAPS Take 1 capsule by mouth daily.    . metoprolol succinate (TOPROL-XL) 50 MG 24 hr tablet Take 1 tablet (50 mg total) by mouth 2 (two) times daily. Take with or immediately following a meal. 60 tablet 4  . sacubitril-valsartan (ENTRESTO) 24-26 MG Take 1 tablet by mouth 2 (two) times daily. 60 tablet 11  . spironolactone (ALDACTONE) 25 MG tablet Take 1 tablet (25 mg total) by mouth daily. 30 tablet 2  . metFORMIN (GLUCOPHAGE) 1000 MG tablet take 1 tablet by mouth twice a day with food (Patient not taking: Reported on 07/04/2016) 60 tablet 3   No current facility-administered medications for this encounter.     Vitals:   07/21/16 1146  BP: (!) 144/88  Pulse: 78  SpO2: 98%  Weight: (!) 300 lb 3.2 oz (136.2 kg)   Wt Readings from Last 3 Encounters:  07/21/16 (!) 300 lb 3.2 oz (136.2 kg)  07/18/16 292 lb 6.4 oz (132.6 kg)  07/09/16 293 lb 3.2 oz (133 kg)    PHYSICAL EXAM:  General: Well appearing. No resp difficulty. HEENT: normal Neck: supple. JVP 5-6. Carotids 2+ bilat; no bruits. No thyromegaly or nodule noted. Cor: PMI nondisplaced. RRR, No M/G/R noted Lungs: CTAB, normal effort. Abdomen: soft, non-tender, distended, no HSM. No bruits or masses. +BS  Extremities: no cyanosis, clubbing, rash, R and LLE no edema.  Neuro: alert & orientedx3, cranial nerves grossly intact. moves all 4 extremities w/o difficulty. Affect pleasant   EKG today shows Aflutter 70s  ASSESSMENT & PLAN:  1. Chronic  systolic CHF: Had [facility] 04/9028 cors ok. NICM, possibly tachycardia-mediated in the setting of rapid atrial fibrillation. HR is now better controlled, in the 90s today but still in atrial fibrillation.  Echo 04/2016 EF 15%.  - NYHA class II - Despite weight gain, he does not appear volume overloaded. He is eating very poorly.  - Increase Entresto 49/51 mg BID.  BMET 7-10 days.    - Continue digoxin 0.133m daily, check digoxin level.  - Continue spironolactone 12.5 mg  daily.  - Continue metoprolol 61m XL BID - Continue Lasix 40 mg daily.  2. Atrial fibrillation: Persistent afib. CHA2DS2-VASc Score 3 and unadjusted Ischemic Stroke Rate (% 5.9.per year) .   Continue amiodarone 200 mg daily. LFTs and TSH OK last check. He will need regular eye exam.  - Continue Eliquis for anticoagulation. No bleeding problems.  - He states he missed one dose of Eliquis 2 weeks ago, but has otherwise been compliant.  - Will plan for TEE-guided DCCV with h/o of LA appendage thrombus.  3. HLD:  - Continue statin.  4. OSA:  - Encouraged nightly CPAP.  5. Obesity:  - Eating too much and poor food choices.  - Encouraged diet and exercise.   MShirley Friar PA-C  07/21/2016

## 2016-07-31 ENCOUNTER — Encounter (HOSPITAL_COMMUNITY): Payer: Self-pay

## 2016-07-31 ENCOUNTER — Encounter (HOSPITAL_COMMUNITY): Payer: Self-pay | Admitting: Certified Registered Nurse Anesthetist

## 2016-07-31 ENCOUNTER — Ambulatory Visit (HOSPITAL_COMMUNITY): Admit: 2016-07-31 | Payer: MEDICAID | Admitting: Cardiology

## 2016-07-31 SURGERY — CARDIOVERSION
Anesthesia: Monitor Anesthesia Care

## 2016-07-31 NOTE — Anesthesia Preprocedure Evaluation (Deleted)
Anesthesia Evaluation  Patient identified by MRN, date of birth, ID band Patient awake    Reviewed: Allergy & Precautions, NPO status , Patient's Chart, lab work & pertinent test results  Airway Mallampati: II       Dental   Pulmonary sleep apnea ,    breath sounds clear to auscultation       Cardiovascular +CHF  + dysrhythmias  Rhythm:Irregular Rate:Normal     Neuro/Psych negative neurological ROS     GI/Hepatic negative GI ROS, Neg liver ROS,   Endo/Other  diabetesMorbid obesity  Renal/GU      Musculoskeletal   Abdominal   Peds  Hematology   Anesthesia Other Findings   Reproductive/Obstetrics                             Anesthesia Physical Anesthesia Plan  ASA: III  Anesthesia Plan: MAC   Post-op Pain Management:    Induction: Intravenous  PONV Risk Score and Plan: 2 and Ondansetron and Dexamethasone  Airway Management Planned: Natural Airway  Additional Equipment:   Intra-op Plan:   Post-operative Plan:   Informed Consent: I have reviewed the patients History and Physical, chart, labs and discussed the procedure including the risks, benefits and alternatives for the proposed anesthesia with the patient or authorized representative who has indicated his/her understanding and acceptance.     Plan Discussed with: CRNA  Anesthesia Plan Comments:         Anesthesia Quick Evaluation

## 2016-08-01 ENCOUNTER — Other Ambulatory Visit (HOSPITAL_COMMUNITY): Payer: Self-pay | Admitting: *Deleted

## 2016-08-01 ENCOUNTER — Other Ambulatory Visit (HOSPITAL_COMMUNITY): Payer: Self-pay

## 2016-08-01 MED ORDER — ATORVASTATIN CALCIUM 80 MG PO TABS: 80.0000 mg | ORAL_TABLET | Freq: Every day | ORAL | 2 refills | Status: DC

## 2016-08-01 MED ORDER — SPIRONOLACTONE 25 MG PO TABS: 25.0000 mg | ORAL_TABLET | Freq: Every day | ORAL | 2 refills | Status: DC

## 2016-08-01 MED FILL — ATORVASTATIN 80 MG TABLET: 80 | 34 days supply | Qty: 34 | Fill #0

## 2016-08-01 MED FILL — SPIRONOLACTONE 25 MG TABLET: 25 | 30 days supply | Qty: 30 | Fill #0

## 2016-08-01 MED FILL — DIGOXIN 0.125 MG TABLET: 125 | 34 days supply | Qty: 34 | Fill #2

## 2016-08-01 MED FILL — METOPROLOL SUCC ER 50 MG TA: 50 | 34 days supply | Qty: 68 | Fill #2

## 2016-08-01 NOTE — Progress Notes (Signed)
Paramedicine Encounter    Patient ID: Darius [name], male    DOB: [birth year], 61 y.o.   MRN: [id number]   Patient Care Team: Darius Kern, DO as PCP - General (Family Medicine) Darius Dresser, Darius [name] as Consulting Physician (Cardiology)  Patient Active Problem List   Diagnosis Date Noted  . Atrial fibrillation with RVR ([place]) 04/17/2016  . Elevated troponin 04/17/2016  . CHF (congestive heart failure) ([place]) 04/17/2016  . Septic olecranon bursitis of right elbow 09/19/2015  . Lactic acid acidosis   . Dilated cardiomyopathy ([place]) 04/19/2014  . Type 2 diabetes mellitus ([place]) 04/09/2014  . Non compliance with medical treatment and diet 04/09/2014  . Chronic anticoagulation-Eliquis started 04/06/14 04/09/2014  . Abnormal chest CT-LUL nodule- needs f/u May 2016 04/09/2014  . Acute on chronic combined systolic and diastolic heart failure ([place]) 04/05/2014  . OSA - C-pap 03/22/2013  . Persistent atrial fibrillation ([place]) 03/27/2011  . Obesity, Class III, BMI 40-49.9 (morbid obesity) ([place]) 03/27/2011  . URI, acute 03/27/2011    Current Outpatient Prescriptions:  .  amiodarone (PACERONE) 200 MG tablet, Take 1 tablet (200 mg total) by mouth daily., Disp: 90 tablet, Rfl: 2 .  apixaban (ELIQUIS) 5 MG TABS tablet, Take 1 tablet (5 mg total) by mouth 2 (two) times daily., Disp: 180 tablet, Rfl: 3 .  atorvastatin (LIPITOR) 80 MG tablet, Take 1 tablet (80 mg total) by mouth at bedtime., Disp: 30 tablet, Rfl: 3 .  digoxin (LANOXIN) 0.125 MG tablet, Take 1 tablet (0.125 mg total) by mouth daily., Disp: 30 tablet, Rfl: 4 .  furosemide (LASIX) 40 MG tablet, Take 1 tablet (40 mg total) by mouth daily., Disp: 30 tablet, Rfl: 4 .  Krill Oil (OMEGA-3) 500 MG CAPS, Take 1 capsule by mouth daily., Disp: , Rfl:  .  metFORMIN (GLUCOPHAGE) 1000 MG tablet, take 1 tablet by mouth twice a day with food, Disp: 60 tablet, Rfl: 3 .  metoprolol succinate (TOPROL-XL) 50 MG 24 hr tablet, Take 1 tablet (50 mg total) by  mouth 2 (two) times daily. Take with or immediately following a meal., Disp: 60 tablet, Rfl: 4 .  sacubitril-valsartan (ENTRESTO) 49-51 MG, Take 1 tablet by mouth 2 (two) times daily., Disp: 60 tablet, Rfl: 11 .  spironolactone (ALDACTONE) 25 MG tablet, Take 1 tablet (25 mg total) by mouth daily., Disp: 30 tablet, Rfl: 6 No Known Allergies   Social History   Social History  . Marital status: Single    Spouse name: N/A  . Number of children: N/A  . Years of education: N/A   Occupational History  . unemployeed    Social History Main Topics  . Smoking status: Never Smoker  . Smokeless tobacco: Never Used  . Alcohol use Yes     Comment: some  . Drug use: No  . Sexual activity: No   Other Topics Concern  . Not on file   Social History Narrative   Admits that he eats poorly and eats a lot at work.      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    Physical Exam  Constitutional: He is oriented to person, place, and time. He appears well-developed.  Neck: Normal range of motion.  Cardiovascular: Normal rate.   Pulmonary/Chest: Effort normal and breath sounds normal. No respiratory distress. He has no wheezes. He has no rales.  Abdominal: Soft. He exhibits no distension.  Musculoskeletal: Normal range of motion. He exhibits no edema.  Neurological: He is alert  and oriented to person, place, and time.  Skin: Skin is warm and dry. No erythema.  Psychiatric: He has a normal mood and affect.        SAFE - 07/04/16 1400      Situation   Admitting diagnosis CHF   Heart failure history Exisiting   Comorbidities DM;HTN;Sleep Apnea   Readmitted within 30 days No   Hospital admission within past 12 months Yes   number of hospital admissions 3   number of ED visits 3     Assessment   Lives alone Yes   Primary support person None   Mode of transportation personal car   Other services involved None   Home equipement Scale     Weight   Weighs self daily  Yes   Scale provided No   Records on weight chart Yes     Resources   Has "Living better w/heart failure" book Yes   Has HF Zone tool Yes   Able to identify yellow zone signs/when to call Darius [name] No   Records zone daily No     Medications   Uses a pill box Yes   Who stocks the pill box paramedic   Pill box checked this visit Yes   Pill box refilled this visit Yes   Difficulty obtaining medications No   Mail order medications No   Missed one or more doses of medications per week Yes   How many missed doses this week 7     Nutrition   Patient receives meals on wheels No   Patient follows low sodium diet No   Has foods at home that meet the current recommended diet No   Patient follows low sugar/card diet No   Nutritional concerns/issues Yes  Low income      Activity Level   ADL's/Mobility Independent   How many feet can patient ambulate >50 feet   Typical activity level Sedentary   Barriers None     Urine   Difficulty urinating No   Changes in urine None     Time spent with patient   Time spent with patient  75 Minutes        Future Appointments Date Time Provider [place]  09/08/2016 11:00 AM [facility] PA/Darius [name] [facility] None   BP 100/60 (BP Location: Right Arm, Patient Position: Sitting, Cuff Size: Normal)   Pulse 66   Resp 18   Wt 294 lb (133.4 kg)   SpO2 96%   BMI 37.75 kg/m  Weight yesterday- 287 lbs    Last visit weight- 292 lbs CBG- 207 mg/dl  D[name]. He reports feeling well and denied SOB, H/A or dizziness. He arrived with his medication bag but only had enough digoxin, spironolactone and metoprolol to get through the weekend. These medications, as well as atorvastatin were ordered. Darius [name] had also not called the numbers to have his Eliquis and Entresto delivered. He had enough to last the next week and were placed in his pillbox. While he was with me, I had him call the numbers and the Darius [name] was "in the final stages of  processing' and he would have to call back either by the end of business today or Monday to order the medication for delivery. When we concluded our meeting, he was on hold with the number for Eliquis. I will follow up on Monday to ensure this was done. He also stated he would pick up his other medications from  the outpatient pharmacy by the end of today. He stated that he would use the weekend bins as a template to finish filling the other medications. His weight increased overnight but he was not sure what time he weighed yesterday and what clothes he was wearing. I asked him if he has been weighing daily and he said no. He was not symptomatic and displayed no edema. I reiterated the importance recording his weight on a chart at the same time each day and he said he would.   Jacquiline Doe, EMT 08/01/16  ACTION: Home visit completed Next visit planned for 1 week

## 2016-08-07 ENCOUNTER — Telehealth (HOSPITAL_COMMUNITY): Payer: Self-pay | Admitting: *Deleted

## 2016-08-07 NOTE — Telephone Encounter (Signed)
[employer] called asking for a working number for patient so they can ship him his [place].  I called them back and provided the number we have listed in [place].

## 2016-08-08 ENCOUNTER — Other Ambulatory Visit (HOSPITAL_COMMUNITY): Payer: Self-pay

## 2016-08-08 NOTE — Progress Notes (Signed)
Paramedicine Encounter    Patient ID: Darius [name], male    DOB: 24-Dec-1955, 61 y.o.   MRN: [id number]   Patient Care Team: Darius Kern, DO as PCP - General (Family Medicine) Darius Dresser, MD as Consulting Physician (Cardiology)  Patient Active Problem List   Diagnosis Date Noted  . Atrial fibrillation with RVR ([place]) 04/17/2016  . Elevated troponin 04/17/2016  . CHF (congestive heart failure) ([place]) 04/17/2016  . Septic olecranon bursitis of right elbow 09/19/2015  . Lactic acid acidosis   . Dilated cardiomyopathy ([place]) 04/19/2014  . Type 2 diabetes mellitus ([place]) 04/09/2014  . Non compliance with medical treatment and diet 04/09/2014  . Chronic anticoagulation-Eliquis started 04/06/14 04/09/2014  . Abnormal chest CT-LUL nodule- needs f/u May 2016 04/09/2014  . Acute on chronic combined systolic and diastolic heart failure ([place]) 04/05/2014  . OSA - C-pap 03/22/2013  . Persistent atrial fibrillation ([place]) 03/27/2011  . Obesity, Class III, BMI 40-49.9 (morbid obesity) ([place]) 03/27/2011  . URI, acute 03/27/2011    Current Outpatient Prescriptions:  .  amiodarone (PACERONE) 200 MG tablet, Take 1 tablet (200 mg total) by mouth daily., Disp: 90 tablet, Rfl: 2 .  apixaban (ELIQUIS) 5 MG TABS tablet, Take 1 tablet (5 mg total) by mouth 2 (two) times daily., Disp: 180 tablet, Rfl: 3 .  atorvastatin (LIPITOR) 80 MG tablet, Take 1 tablet (80 mg total) by mouth at bedtime., Disp: 30 tablet, Rfl: 2 .  digoxin (LANOXIN) 0.125 MG tablet, Take 1 tablet (0.125 mg total) by mouth daily., Disp: 30 tablet, Rfl: 4 .  furosemide (LASIX) 40 MG tablet, Take 1 tablet (40 mg total) by mouth daily., Disp: 30 tablet, Rfl: 4 .  Krill Oil (OMEGA-3) 500 MG CAPS, Take 1 capsule by mouth daily., Disp: , Rfl:  .  metoprolol succinate (TOPROL-XL) 50 MG 24 hr tablet, Take 1 tablet (50 mg total) by mouth 2 (two) times daily. Take with or immediately following a meal., Disp: 60 tablet, Rfl: 4 .   sacubitril-valsartan (ENTRESTO) 49-51 MG, Take 1 tablet by mouth 2 (two) times daily., Disp: 60 tablet, Rfl: 11 .  spironolactone (ALDACTONE) 25 MG tablet, Take 1 tablet (25 mg total) by mouth daily., Disp: 30 tablet, Rfl: 2 .  metFORMIN (GLUCOPHAGE) 1000 MG tablet, take 1 tablet by mouth twice a day with food (Patient not taking: Reported on 08/08/2016), Disp: 60 tablet, Rfl: 3 No Known Allergies   Social History   Social History  . Marital status: Single    Spouse name: N/A  . Number of children: N/A  . Years of education: N/A   Occupational History  . unemployeed    Social History Main Topics  . Smoking status: Never Smoker  . Smokeless tobacco: Never Used  . Alcohol use Yes     Comment: some  . Drug use: No  . Sexual activity: No   Other Topics Concern  . Not on file   Social History Narrative   Admits that he eats poorly and eats a lot at work.      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    Physical Exam  Constitutional: He is oriented to person, place, and time. He appears well-developed.  Neck: Normal range of motion.  Cardiovascular: Normal rate.   Pulmonary/Chest: Effort normal and breath sounds normal. No respiratory distress. He has no wheezes. He has no rales.  Abdominal: Soft.  Musculoskeletal: Normal range of motion. He exhibits no edema.  Neurological: He  is alert and oriented to person, place, and time.  Skin: Skin is warm.  Psychiatric: He has a normal mood and affect.        SAFE - 07/04/16 1400      Situation   Admitting diagnosis CHF   Heart failure history Exisiting   Comorbidities DM;HTN;Sleep Apnea   Readmitted within 30 days No   Hospital admission within past 12 months Yes   number of hospital admissions 3   number of ED visits 3     Assessment   Lives alone Yes   Primary support person None   Mode of transportation personal car   Other services involved None   Home equipement Scale     Weight   Weighs  self daily Yes   Scale provided No   Records on weight chart Yes     Resources   Has "Living better w/heart failure" book Yes   Has HF Zone tool Yes   Able to identify yellow zone signs/when to call MD No   Records zone daily No     Medications   Uses a pill box Yes   Who stocks the pill box paramedic   Pill box checked this visit Yes   Pill box refilled this visit Yes   Difficulty obtaining medications No   Mail order medications No   Missed one or more doses of medications per week Yes   How many missed doses this week 7     Nutrition   Patient receives meals on wheels No   Patient follows low sodium diet No   Has foods at home that meet the current recommended diet No   Patient follows low sugar/card diet No   Nutritional concerns/issues Yes  Low income      Activity Level   ADL's/Mobility Independent   How many feet can patient ambulate >50 feet   Typical activity level Sedentary   Barriers None     Urine   Difficulty urinating No   Changes in urine None     Time spent with patient   Time spent with patient  75 Minutes        Future Appointments Date Time Provider [place]  09/08/2016 11:00 AM [facility] PA/NP [facility] None   BP 110/80 (BP Location: Right Arm, Patient Position: Sitting, Cuff Size: Normal)   Pulse 66   Resp 18   Wt 294 lb 8 oz (133.6 kg)   SpO2 96%   BMI 37.81 kg/m  Weight yesterday- 293.5 lb Last visit weight- 294 lbs  Mr Darius [name] [facility] and reports feeling well. He stated he was not sure how to complete the paperwork her received able the medication assist program so he was helped with that. He denied SOB, H/A or dizziness but continues to eat poorly and is not weighing himself at the same time each day, not is he writing his weights down. I have explained these things on multiple occasions and he says he understands but he is not changing the actions. His medications were verified and his pillbox was refilled. He  was given more samples of entresto and eliquis to last him for the next week in hopes that the mail order medications will begin being sent by then.   Jacquiline Doe, EMT 08/08/16  ACTION: Home visit completed Next visit planned for 1 week

## 2016-08-15 ENCOUNTER — Other Ambulatory Visit (HOSPITAL_COMMUNITY): Payer: Self-pay

## 2016-08-15 NOTE — Progress Notes (Signed)
Paramedicine Encounter    Patient ID: Darius [name], male    DOB: 1955/06/17, 61 y.o.   MRN: [id number]   Patient Care Team: Darius Kern, DO as PCP - General (Family Medicine) Darius Dresser, MD as Consulting Physician (Cardiology)  Patient Active Problem List   Diagnosis Date Noted  . Atrial fibrillation with RVR ([place]) 04/17/2016  . Elevated troponin 04/17/2016  . CHF (congestive heart failure) ([place]) 04/17/2016  . Septic olecranon bursitis of right elbow 09/19/2015  . Lactic acid acidosis   . Dilated cardiomyopathy ([place]) 04/19/2014  . Type 2 diabetes mellitus ([place]) 04/09/2014  . Non compliance with medical treatment and diet 04/09/2014  . Chronic anticoagulation-Eliquis started 04/06/14 04/09/2014  . Abnormal chest CT-LUL nodule- needs f/u May 2016 04/09/2014  . Acute on chronic combined systolic and diastolic heart failure ([place]) 04/05/2014  . OSA - C-pap 03/22/2013  . Persistent atrial fibrillation ([place]) 03/27/2011  . Obesity, Class III, BMI 40-49.9 (morbid obesity) ([place]) 03/27/2011  . URI, acute 03/27/2011    Current Outpatient Prescriptions:  .  amiodarone (PACERONE) 200 MG tablet, Take 1 tablet (200 mg total) by mouth daily., Disp: 90 tablet, Rfl: 2 .  apixaban (ELIQUIS) 5 MG TABS tablet, Take 1 tablet (5 mg total) by mouth 2 (two) times daily., Disp: 180 tablet, Rfl: 3 .  atorvastatin (LIPITOR) 80 MG tablet, Take 1 tablet (80 mg total) by mouth at bedtime., Disp: 30 tablet, Rfl: 2 .  digoxin (LANOXIN) 0.125 MG tablet, Take 1 tablet (0.125 mg total) by mouth daily., Disp: 30 tablet, Rfl: 4 .  furosemide (LASIX) 40 MG tablet, Take 1 tablet (40 mg total) by mouth daily., Disp: 30 tablet, Rfl: 4 .  Krill Oil (OMEGA-3) 500 MG CAPS, Take 1 capsule by mouth daily., Disp: , Rfl:  .  metoprolol succinate (TOPROL-XL) 50 MG 24 hr tablet, Take 1 tablet (50 mg total) by mouth 2 (two) times daily. Take with or immediately following a meal., Disp: 60 tablet, Rfl: 4 .   sacubitril-valsartan (ENTRESTO) 49-51 MG, Take 1 tablet by mouth 2 (two) times daily., Disp: 60 tablet, Rfl: 11 .  spironolactone (ALDACTONE) 25 MG tablet, Take 1 tablet (25 mg total) by mouth daily., Disp: 30 tablet, Rfl: 2 .  metFORMIN (GLUCOPHAGE) 1000 MG tablet, take 1 tablet by mouth twice a day with food (Patient not taking: Reported on 08/08/2016), Disp: 60 tablet, Rfl: 3 No Known Allergies   Social History   Social History  . Marital status: Single    Spouse name: N/A  . Number of children: N/A  . Years of education: N/A   Occupational History  . unemployeed    Social History Main Topics  . Smoking status: Never Smoker  . Smokeless tobacco: Never Used  . Alcohol use Yes     Comment: some  . Drug use: No  . Sexual activity: No   Other Topics Concern  . Not on file   Social History Narrative   Admits that he eats poorly and eats a lot at work.      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    Physical Exam  Constitutional: He is oriented to person, place, and time. He appears well-developed.  Neck: Normal range of motion.  Cardiovascular: Normal rate.   Pulmonary/Chest: Effort normal and breath sounds normal. No respiratory distress. He has no wheezes. He has no rales.  Abdominal: Soft. He exhibits no distension.  Musculoskeletal: Normal range of motion. He exhibits edema.  Neurological: He is alert and oriented to person, place, and time.  Skin: Skin is warm and dry.  Psychiatric: He has a normal mood and affect.        SAFE - 07/04/16 1400      Situation   Admitting diagnosis CHF   Heart failure history Exisiting   Comorbidities DM;HTN;Sleep Apnea   Readmitted within 30 days No   Hospital admission within past 12 months Yes   number of hospital admissions 3   number of ED visits 3     Assessment   Lives alone Yes   Primary support person None   Mode of transportation personal car   Other services involved None   Home  equipement Scale     Weight   Weighs self daily Yes   Scale provided No   Records on weight chart Yes     Resources   Has "Living better w/heart failure" book Yes   Has HF Zone tool Yes   Able to identify yellow zone signs/when to call MD No   Records zone daily No     Medications   Uses a pill box Yes   Who stocks the pill box paramedic   Pill box checked this visit Yes   Pill box refilled this visit Yes   Difficulty obtaining medications No   Mail order medications No   Missed one or more doses of medications per week Yes   How many missed doses this week 7     Nutrition   Patient receives meals on wheels No   Patient follows low sodium diet No   Has foods at home that meet the current recommended diet No   Patient follows low sugar/card diet No   Nutritional concerns/issues Yes  Low income      Activity Level   ADL's/Mobility Independent   How many feet can patient ambulate >50 feet   Typical activity level Sedentary   Barriers None     Urine   Difficulty urinating No   Changes in urine None     Time spent with patient   Time spent with patient  75 Minutes        Future Appointments Date Time Provider [place]  09/08/2016 11:00 AM [facility] PA/NP [facility] None   BP 108/70 (BP Location: Right Arm, Patient Position: Sitting, Cuff Size: Normal)   Pulse 62   Resp 18   Wt 293 lb (132.9 kg)   SpO2 96%   BMI 37.62 kg/m  Weight yesterday- 296.4 lbs Last visit weight- 294 lbs CBG- 163 mg/dl  [name] [facility]. He reports feeling generally well and denied SOB, H/A or dizziness. Entresto in pill box through Wednesday. Has not received mail order medications (Entresto and Eliquis) and I will follow up on this via telephone on Monday. He still has not found a PCP and said he was "kicked out" from the most recent practice where he was treated because he missed too many appointments. I talked with him about his missed cardioversion  appointment and he said he has not rescheduled because he doesn't know when he can get a ride to and from the hospital. He also said it would be a waste of time "if they don't know if the clot is gone or not but they wont know that until they check." I explained to him the importance of this procedure and told him he needed to call the clinic and reschedule as  soon as possible. He continues to eat poorly and says it is because "everything is bad so it doesn't matter that much." I continue to try educating him on his disease processes and why he should follow a heart healthy diet as well as a diabetic conscious diet. He also asked me to aid him in filling out paper work regarding previously owned vehicles, which had been requested by a case worker handling his disability claim. I looked over to paper work and advised him to go to the case workers office when he left me and get her assistance.   Jacquiline Doe, EMT 08/15/16  ACTION: Home visit completed Next visit planned for 1 week

## 2016-08-21 ENCOUNTER — Telehealth (HOSPITAL_COMMUNITY): Payer: Self-pay | Admitting: Surgery

## 2016-08-21 NOTE — Telephone Encounter (Signed)
Darius [name] called to make an appt for his cardioversion to be done by Dr. [name].  I have forwarded the message on to clinic coordinator and scheduler to have them call him back with an appropriate time/ date for this procedure to be scheduled.

## 2016-08-22 ENCOUNTER — Other Ambulatory Visit (HOSPITAL_COMMUNITY): Payer: Self-pay

## 2016-08-22 NOTE — Telephone Encounter (Signed)
Spoke w/pt, he does need a TEE/DCCV pe rDr McLean's last OV note, this has been scheduled multiple times and pt cancels.  I have given him several dates to choose from:  7/19, 7/25 or 7/26.  He states he will discuss w/his friend who is going to bring him for procedure and he will call me back.

## 2016-08-22 NOTE — Progress Notes (Signed)
Paramedicine Encounter    Patient ID: Darius [name], male    DOB: [birth year], 61 y.o.   MRN: [id number]   Patient Care Team: Darius Kern, DO as PCP - General (Family Medicine) Darius Dresser, MD as Consulting Physician (Cardiology)  Patient Active Problem List   Diagnosis Date Noted  . Atrial fibrillation with RVR ([place]) 04/17/2016  . Elevated troponin 04/17/2016  . CHF (congestive heart failure) ([place]) 04/17/2016  . Septic olecranon bursitis of right elbow 09/19/2015  . Lactic acid acidosis   . Dilated cardiomyopathy ([place]) 04/19/2014  . Type 2 diabetes mellitus ([place]) 04/09/2014  . Non compliance with medical treatment and diet 04/09/2014  . Chronic anticoagulation-Eliquis started 04/06/14 04/09/2014  . Abnormal chest CT-LUL nodule- needs f/u May 2016 04/09/2014  . Acute on chronic combined systolic and diastolic heart failure ([place]) 04/05/2014  . OSA - C-pap 03/22/2013  . Persistent atrial fibrillation ([place]) 03/27/2011  . Obesity, Class III, BMI 40-49.9 (morbid obesity) ([place]) 03/27/2011  . URI, acute 03/27/2011    Current Outpatient Prescriptions:  .  amiodarone (PACERONE) 200 MG tablet, Take 1 tablet (200 mg total) by mouth daily., Disp: 90 tablet, Rfl: 2 .  apixaban (ELIQUIS) 5 MG TABS tablet, Take 1 tablet (5 mg total) by mouth 2 (two) times daily., Disp: 180 tablet, Rfl: 3 .  atorvastatin (LIPITOR) 80 MG tablet, Take 1 tablet (80 mg total) by mouth at bedtime., Disp: 30 tablet, Rfl: 2 .  digoxin (LANOXIN) 0.125 MG tablet, Take 1 tablet (0.125 mg total) by mouth daily., Disp: 30 tablet, Rfl: 4 .  furosemide (LASIX) 40 MG tablet, Take 1 tablet (40 mg total) by mouth daily., Disp: 30 tablet, Rfl: 4 .  metoprolol succinate (TOPROL-XL) 50 MG 24 hr tablet, Take 1 tablet (50 mg total) by mouth 2 (two) times daily. Take with or immediately following a meal., Disp: 60 tablet, Rfl: 4 .  sacubitril-valsartan (ENTRESTO) 49-51 MG, Take 1 tablet by mouth 2 (two) times daily., Disp: 60  tablet, Rfl: 11 .  spironolactone (ALDACTONE) 25 MG tablet, Take 1 tablet (25 mg total) by mouth daily., Disp: 30 tablet, Rfl: 2 .  Krill Oil (OMEGA-3) 500 MG CAPS, Take 1 capsule by mouth daily., Disp: , Rfl:  .  metFORMIN (GLUCOPHAGE) 1000 MG tablet, take 1 tablet by mouth twice a day with food (Patient not taking: Reported on 08/08/2016), Disp: 60 tablet, Rfl: 3 No Known Allergies   Social History   Social History  . Marital status: Single    Spouse name: N/A  . Number of children: N/A  . Years of education: N/A   Occupational History  . unemployeed    Social History Main Topics  . Smoking status: Never Smoker  . Smokeless tobacco: Never Used  . Alcohol use Yes     Comment: some  . Drug use: No  . Sexual activity: No   Other Topics Concern  . Not on file   Social History Narrative   Admits that he eats poorly and eats a lot at work.      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    Physical Exam  Constitutional: He is oriented to person, place, and time.  Neck: Normal range of motion.  Cardiovascular: Normal rate.   Pulmonary/Chest: Effort normal and breath sounds normal. No respiratory distress. He has no wheezes. He has no rales.  Musculoskeletal: Normal range of motion.  Neurological: He is alert and oriented to person, place, and time.  Skin: Skin is warm and dry.  Psychiatric: He has a normal mood and affect.        SAFE - 07/04/16 1400      Situation   Admitting diagnosis CHF   Heart failure history Exisiting   Comorbidities DM;HTN;Sleep Apnea   Readmitted within 30 days No   Hospital admission within past 12 months Yes   number of hospital admissions 3   number of ED visits 3     Assessment   Lives alone Yes   Primary support person None   Mode of transportation personal car   Other services involved None   Home equipement Scale     Weight   Weighs self daily Yes   Scale provided No   Records on weight chart Yes      Resources   Has "Living better w/heart failure" book Yes   Has HF Zone tool Yes   Able to identify yellow zone signs/when to call MD No   Records zone daily No     Medications   Uses a pill box Yes   Who stocks the pill box paramedic   Pill box checked this visit Yes   Pill box refilled this visit Yes   Difficulty obtaining medications No   Mail order medications No   Missed one or more doses of medications per week Yes   How many missed doses this week 7     Nutrition   Patient receives meals on wheels No   Patient follows low sodium diet No   Has foods at home that meet the current recommended diet No   Patient follows low sugar/card diet No   Nutritional concerns/issues Yes  Low income      Activity Level   ADL's/Mobility Independent   How many feet can patient ambulate >50 feet   Typical activity level Sedentary   Barriers None     Urine   Difficulty urinating No   Changes in urine None     Time spent with patient   Time spent with patient  75 Minutes        Future Appointments Date Time Provider [place]  09/08/2016 11:00 AM [facility] PA/NP [facility] None   BP 130/80 (BP Location: Right Arm, Patient Position: Sitting, Cuff Size: Normal)   Pulse 70   Resp 16   Wt 292 lb (132.5 kg)   SpO2 98%   BMI 37.49 kg/m  Weight yesterday- 293.5 lbs Last visit weight- 293 lbs CBG- 200 mg/dl  Mr Darius [name] [facility] He arrived with two women who are friends and he advised they "help take care of [him]." The women expressed concern over his living conditions as well as his failure to comply with a heart health and diabetic conscious diet. Mr Darius [name] continues to say that he only eats fast food or microwave meals and that it is impossible to avoid salt. I went through multiple was to reduce his salt intake however he does not seem receptive. Mr Darius [name] denied SOB, H/A or dizziness. Medications were verified and his pill box was refilled.  He still has not  received Entresto or Eliquis in the mail despite having called and ordered the medications over 2 weeks ago. I called both assistance programs and spoke with representatives, neither could advise why the medications had not been mailed yet but they were able to ensure imminent shipment. Entresto 49/51 mg will be delivered tomorrow via overnight UPS and Eliquis will  be delivered in the "next few days." I contacted Doroteo Bradford and she was able to provide a box of Eliquis to last until delivery.   I also tried to set up a an appointment with [facility]. The staff advised since he has not been seen there in over a year, he would have to be considered a "new patient" and they would not be able to schedule an appointment today. Staff advised he would have to call back on August 1 to schedule an appointment.  Jacquiline Doe, EMT-Paramedic 08/22/16  ACTION: Home visit completed Next visit planned for 1 week

## 2016-08-26 ENCOUNTER — Telehealth (HOSPITAL_COMMUNITY): Payer: Self-pay | Admitting: Surgery

## 2016-08-26 NOTE — Telephone Encounter (Signed)
Darius [name] again called me related to scheduling his upcoming Cardioversion.  I have made Heather Schub -RN HF Clinic Coordinator aware that he called again and is expecting a return call.  She has previously called him with options of dates and times for procedure to be scheduled.  She has also previously provided him with the appropriate phone number to call back in order to schedule.  She will attempt to return call to Darius Cone.

## 2016-08-28 ENCOUNTER — Other Ambulatory Visit (HOSPITAL_COMMUNITY): Payer: Self-pay | Admitting: *Deleted

## 2016-08-28 ENCOUNTER — Telehealth (HOSPITAL_COMMUNITY): Payer: Self-pay | Admitting: Vascular Surgery

## 2016-08-28 DIAGNOSIS — I4891 Unspecified atrial fibrillation: Secondary | ICD-10-CM

## 2016-08-28 NOTE — Telephone Encounter (Signed)
Zada Girt [phone]) pt's friend has called to help scheduled pt's TEE/DCCV, pt is also in the background and gave me ok to discuss w/Marsha.  Scheduled pt for 7/30 at 11 am, Rosann Auerbach and pt are both aware and she will make sure pt is here at 9 am that day, all instructions reviewed w/her, she is agreeable and will make sure pt also understands everything.

## 2016-08-28 NOTE — Telephone Encounter (Signed)
Returned pt call  

## 2016-08-28 NOTE — Telephone Encounter (Signed)
Per previous phone mess I have given pt dates and am waiting on him to call me back and let me know what date works for him and his friend who will provide transportation.  Attempted to call pt today, no answer and no VM

## 2016-08-29 ENCOUNTER — Other Ambulatory Visit (HOSPITAL_COMMUNITY): Payer: Self-pay

## 2016-08-29 MED FILL — SPIRONOLACTONE 25 MG TABLET: 25 | 30 days supply | Qty: 30 | Fill #1

## 2016-08-29 MED FILL — ATORVASTATIN 80 MG TABLET: 80 | 34 days supply | Qty: 34 | Fill #1

## 2016-08-29 MED FILL — AMIODARONE HCL 200 MG TAB: 200 | 34 days supply | Qty: 34 | Fill #0

## 2016-08-29 MED FILL — FUROSEMIDE 40 MG TABLET: 40 | 34 days supply | Qty: 34 | Fill #1

## 2016-08-29 NOTE — Progress Notes (Signed)
Paramedicine Encounter    Patient ID: Darius [name], male    DOB: [birth year], 61 y.o.   MRN: [id number]   Patient Care Team: [name], DO as PCP - General (Family Medicine) [name], MD as Consulting Physician (Cardiology)  Patient Active Problem List   Diagnosis Date Noted  . Atrial fibrillation with RVR ([place]) 04/17/2016  . Elevated troponin 04/17/2016  . CHF (congestive heart failure) ([place]) 04/17/2016  . Septic olecranon bursitis of right elbow 09/19/2015  . Lactic acid acidosis   . Dilated cardiomyopathy ([place]) 04/19/2014  . Type 2 diabetes mellitus ([place]) 04/09/2014  . Non compliance with medical treatment and diet 04/09/2014  . Chronic anticoagulation-Eliquis started 04/06/14 04/09/2014  . Abnormal chest CT-LUL nodule- needs f/u May 2016 04/09/2014  . Acute on chronic combined systolic and diastolic heart failure ([place]) 04/05/2014  . OSA - C-pap 03/22/2013  . Persistent atrial fibrillation ([place]) 03/27/2011  . Obesity, Class III, BMI 40-49.9 (morbid obesity) ([place]) 03/27/2011  . URI, acute 03/27/2011    Current Outpatient Prescriptions:  .  amiodarone (PACERONE) 200 MG tablet, Take 1 tablet (200 mg total) by mouth daily., Disp: 90 tablet, Rfl: 2 .  apixaban (ELIQUIS) 5 MG TABS tablet, Take 1 tablet (5 mg total) by mouth 2 (two) times daily., Disp: 180 tablet, Rfl: 3 .  atorvastatin (LIPITOR) 80 MG tablet, Take 1 tablet (80 mg total) by mouth at bedtime., Disp: 30 tablet, Rfl: 2 .  digoxin (LANOXIN) 0.125 MG tablet, Take 1 tablet (0.125 mg total) by mouth daily., Disp: 30 tablet, Rfl: 4 .  furosemide (LASIX) 40 MG tablet, Take 1 tablet (40 mg total) by mouth daily., Disp: 30 tablet, Rfl: 4 .  metoprolol succinate (TOPROL-XL) 50 MG 24 hr tablet, Take 1 tablet (50 mg total) by mouth 2 (two) times daily. Take with or immediately following a meal., Disp: 60 tablet, Rfl: 4 .  sacubitril-valsartan (ENTRESTO) 49-51 MG, Take 1 tablet by mouth 2 (two) times daily., Disp: 60  tablet, Rfl: 11 .  spironolactone (ALDACTONE) 25 MG tablet, Take 1 tablet (25 mg total) by mouth daily., Disp: 30 tablet, Rfl: 2 .  Krill Oil (OMEGA-3) 500 MG CAPS, Take 1 capsule by mouth daily., Disp: , Rfl:  .  metFORMIN (GLUCOPHAGE) 1000 MG tablet, take 1 tablet by mouth twice a day with food (Patient not taking: Reported on 08/08/2016), Disp: 60 tablet, Rfl: 3 No Known Allergies   Social History   Social History  . Marital status: Single    Spouse name: N/A  . Number of children: N/A  . Years of education: N/A   Occupational History  . unemployeed    Social History Main Topics  . Smoking status: Never Smoker  . Smokeless tobacco: Never Used  . Alcohol use Yes     Comment: some  . Drug use: No  . Sexual activity: No   Other Topics Concern  . Not on file   Social History Narrative   Admits that he eats poorly and eats a lot at work.      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    Physical Exam  Constitutional: He is oriented to person, place, and time. He appears well-developed.  Neck: Normal range of motion.  Cardiovascular: Normal rate.   Pulmonary/Chest: Effort normal and breath sounds normal. No respiratory distress. He has no wheezes. He has no rales.  Abdominal: Soft. He exhibits no distension.  Musculoskeletal: Normal range of motion. He exhibits no  edema.  Neurological: He is alert and oriented to person, place, and time.  Skin: Skin is warm and dry.  Psychiatric: He has a normal mood and affect.        SAFE - 07/04/16 1400      Situation   Admitting diagnosis CHF   Heart failure history Exisiting   Comorbidities DM;HTN;Sleep Apnea   Readmitted within 30 days No   Hospital admission within past 12 months Yes   number of hospital admissions 3   number of ED visits 3     Assessment   Lives alone Yes   Primary support person None   Mode of transportation personal car   Other services involved None   Home equipement Scale      Weight   Weighs self daily Yes   Scale provided No   Records on weight chart Yes     Resources   Has "Living better w/heart failure" book Yes   Has HF Zone tool Yes   Able to identify yellow zone signs/when to call MD No   Records zone daily No     Medications   Uses a pill box Yes   Who stocks the pill box paramedic   Pill box checked this visit Yes   Pill box refilled this visit Yes   Difficulty obtaining medications No   Mail order medications No   Missed one or more doses of medications per week Yes   How many missed doses this week 7     Nutrition   Patient receives meals on wheels No   Patient follows low sodium diet No   Has foods at home that meet the current recommended diet No   Patient follows low sugar/card diet No   Nutritional concerns/issues Yes  Low income      Activity Level   ADL's/Mobility Independent   How many feet can patient ambulate >50 feet   Typical activity level Sedentary   Barriers None     Urine   Difficulty urinating No   Changes in urine None     Time spent with patient   Time spent with patient  75 Minutes        Future Appointments Date Time Provider [place]  09/08/2016 11:00 AM [facility] PA/NP [facility] None   BP 110/70 (BP Location: Right Arm, Patient Position: Sitting, Cuff Size: Normal)   Pulse 93   Resp 18   Wt 289 lb (131.1 kg)   SpO2 97%   BMI 37.11 kg/m  Weight yesterday- 288 lbs Last visit weight- 292 lbs CBG- 153 mg/dl  Mr [facility] and reports feeling well. He received Entresto in the mail earlier this week but Eliquis had not been delivered. The medication assistance line was called and they advised they would have it shipped out by the end of today. Mr Liou had enough samples of Eliquis to get through the next week. Medications were verified and his pillbox was refilled. He Denied SOB, H/A or dizziness. He stated he has food in his refrigerator that he can eat for the next  three days but he was unsure if the food met the low sodium/sugar guidelines. He is having financial difficulty due to over-drafting his bank account on multiple occasions and incurring bank fees for each transaction. Because of this, I am going to ask about food assistance for him.   Jacquiline Doe, EMT 08/29/16  ACTION: Home visit completed Next visit planned  for 1 week

## 2016-09-05 ENCOUNTER — Telehealth (HOSPITAL_COMMUNITY): Payer: Self-pay

## 2016-09-05 ENCOUNTER — Other Ambulatory Visit (HOSPITAL_COMMUNITY): Payer: Self-pay

## 2016-09-05 MED FILL — DIGOXIN 0.125 MG TABLET: 125 | 34 days supply | Qty: 34 | Fill #0

## 2016-09-05 MED FILL — METOPROLOL SUCC ER 50 MG TA: 50 | 34 days supply | Qty: 68 | Fill #0

## 2016-09-05 NOTE — Telephone Encounter (Signed)
Received call from paramed that pts weight up 9 lbs in 1 week. Advised to take extra fluid tablet and call Monday to follow up.  Renee Pain, RN

## 2016-09-05 NOTE — Telephone Encounter (Signed)
Received call

## 2016-09-05 NOTE — Progress Notes (Signed)
Paramedicine Encounter    Patient ID: Darius [name], male    DOB: March 16, 1955, 62 y.o.   MRN: [id number]   Patient Care Team: [name], DO as PCP - General (Family Medicine) [name], MD as Consulting Physician (Cardiology)  Patient Active Problem List   Diagnosis Date Noted  . Atrial fibrillation with RVR ([place]) 04/17/2016  . Elevated troponin 04/17/2016  . CHF (congestive heart failure) ([place]) 04/17/2016  . Septic olecranon bursitis of right elbow 09/19/2015  . Lactic acid acidosis   . Dilated cardiomyopathy ([place]) 04/19/2014  . Type 2 diabetes mellitus ([place]) 04/09/2014  . Non compliance with medical treatment and diet 04/09/2014  . Chronic anticoagulation-Eliquis started 04/06/14 04/09/2014  . Abnormal chest CT-LUL nodule- needs f/u May 2016 04/09/2014  . Acute on chronic combined systolic and diastolic heart failure ([place]) 04/05/2014  . OSA - C-pap 03/22/2013  . Persistent atrial fibrillation ([place]) 03/27/2011  . Obesity, Class III, BMI 40-49.9 (morbid obesity) ([place]) 03/27/2011  . URI, acute 03/27/2011    Current Outpatient Prescriptions:  .  amiodarone (PACERONE) 200 MG tablet, Take 1 tablet (200 mg total) by mouth daily., Disp: 90 tablet, Rfl: 2 .  apixaban (ELIQUIS) 5 MG TABS tablet, Take 1 tablet (5 mg total) by mouth 2 (two) times daily., Disp: 180 tablet, Rfl: 3 .  atorvastatin (LIPITOR) 80 MG tablet, Take 1 tablet (80 mg total) by mouth at bedtime., Disp: 30 tablet, Rfl: 2 .  digoxin (LANOXIN) 0.125 MG tablet, Take 1 tablet (0.125 mg total) by mouth daily., Disp: 30 tablet, Rfl: 4 .  furosemide (LASIX) 40 MG tablet, Take 1 tablet (40 mg total) by mouth daily., Disp: 30 tablet, Rfl: 4 .  metoprolol succinate (TOPROL-XL) 50 MG 24 hr tablet, Take 1 tablet (50 mg total) by mouth 2 (two) times daily. Take with or immediately following a meal., Disp: 60 tablet, Rfl: 4 .  sacubitril-valsartan (ENTRESTO) 49-51 MG, Take 1 tablet by mouth 2 (two) times daily., Disp: 60  tablet, Rfl: 11 .  spironolactone (ALDACTONE) 25 MG tablet, Take 1 tablet (25 mg total) by mouth daily., Disp: 30 tablet, Rfl: 2 .  Krill Oil (OMEGA-3) 500 MG CAPS, Take 1 capsule by mouth daily., Disp: , Rfl:  .  metFORMIN (GLUCOPHAGE) 1000 MG tablet, take 1 tablet by mouth twice a day with food (Patient not taking: Reported on 08/08/2016), Disp: 60 tablet, Rfl: 3 No Known Allergies   Social History   Social History  . Marital status: Single    Spouse name: N/A  . Number of children: N/A  . Years of education: N/A   Occupational History  . unemployeed    Social History Main Topics  . Smoking status: Never Smoker  . Smokeless tobacco: Never Used  . Alcohol use Yes     Comment: some  . Drug use: No  . Sexual activity: No   Other Topics Concern  . Not on file   Social History Narrative   Admits that he eats poorly and eats a lot at work.      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    Physical Exam  Constitutional: He is oriented to person, place, and time. He appears well-developed.  Cardiovascular: Normal rate.   Pulmonary/Chest: Effort normal and breath sounds normal. No respiratory distress. He has no wheezes. He has no rales.  Abdominal: Soft. He exhibits no distension.  Musculoskeletal: Normal range of motion. He exhibits no edema.  Neurological: He is alert  and oriented to person, place, and time.  Skin: Skin is warm.        Future Appointments Date Time Provider [place]  09/19/2016 10:30 AM [facility] PA/NP [facility] None   BP 112/70 (BP Location: Left Arm, Patient Position: Sitting, Cuff Size: Normal)   Pulse 82   Resp 18   Wt 298 lb (135.2 kg)   SpO2 97%   BMI 38.26 kg/m  Weight yesterday- 297 lbs Last visit weight- 289 lbs CBG 217 mg/dl  Mr Schake [facility]. He reports feeling well though he is up 9 pounds since last week. He stated he has not been eating properly and he does not intend to begin  cooking for himself any time in the foreseeable future. He stated he does not like having to clean up after he cooks so he just eats out. He stated he has eaten pizza on more than one occasion this week and says that he cant afford going to "health food stores." I explained that he can get food that is healthy at the [employer] less than a mile from where we meet every Friday but he said unless it is already prepared for him, he is not interested. I explained that this attitude towards preparing his own food and unwillingness to follow a heart healthy diet is worsening his health and that if he did not change how he is living, he will not live long with CHF.   He stated that he still had not received his eliquis in the mail but upon calling the help line, they advised it was delivered earlier this week and was left on his door step. He stated he had not looked there but would check when he got home and let me know if it was there. He stated he had enough sample packs at home to get him through the next week if he did not find the package. He stated he just forgot to bring the sample packs with him today but he was certain that he knew where they were.   Medications verified and his pillbox was refilled. Refills for carvedilol and digoxin were ordered from the outpatient pharmacy.  Jacquiline Doe, EMT  09/05/16  ACTION: Home visit completed Next visit planned for 1 week

## 2016-09-08 ENCOUNTER — Ambulatory Visit (HOSPITAL_COMMUNITY)
Admission: RE | Admit: 2016-09-08 | Discharge: 2016-09-08 | Disposition: A | Discharge status: Home / Self Care | Payer: Self-pay | Source: Ambulatory Visit | Attending: Cardiology | Admitting: Cardiology

## 2016-09-08 ENCOUNTER — Encounter (HOSPITAL_COMMUNITY): Payer: Self-pay | Admitting: Emergency Medicine

## 2016-09-08 ENCOUNTER — Ambulatory Visit (HOSPITAL_COMMUNITY): Payer: Self-pay | Admitting: Anesthesiology

## 2016-09-08 ENCOUNTER — Encounter (HOSPITAL_COMMUNITY)
Admission: RE | Disposition: A | Discharge status: Home / Self Care | Payer: Self-pay | Source: Ambulatory Visit | Attending: Cardiology

## 2016-09-08 ENCOUNTER — Ambulatory Visit (HOSPITAL_BASED_OUTPATIENT_CLINIC_OR_DEPARTMENT_OTHER): Payer: Self-pay

## 2016-09-08 ENCOUNTER — Encounter (HOSPITAL_COMMUNITY): Payer: Self-pay

## 2016-09-08 DIAGNOSIS — I340 Nonrheumatic mitral (valve) insufficiency: Secondary | ICD-10-CM

## 2016-09-08 DIAGNOSIS — Z86718 Personal history of other venous thrombosis and embolism: Secondary | ICD-10-CM | POA: Insufficient documentation

## 2016-09-08 DIAGNOSIS — Z79899 Other long term (current) drug therapy: Secondary | ICD-10-CM | POA: Insufficient documentation

## 2016-09-08 DIAGNOSIS — R000 Tachycardia, unspecified: Secondary | ICD-10-CM | POA: Insufficient documentation

## 2016-09-08 DIAGNOSIS — I481 Persistent atrial fibrillation: Secondary | ICD-10-CM | POA: Insufficient documentation

## 2016-09-08 DIAGNOSIS — I428 Other cardiomyopathies: Secondary | ICD-10-CM | POA: Insufficient documentation

## 2016-09-08 DIAGNOSIS — Z7984 Long term (current) use of oral hypoglycemic drugs: Secondary | ICD-10-CM | POA: Insufficient documentation

## 2016-09-08 DIAGNOSIS — G4733 Obstructive sleep apnea (adult) (pediatric): Secondary | ICD-10-CM | POA: Insufficient documentation

## 2016-09-08 DIAGNOSIS — I110 Hypertensive heart disease with heart failure: Secondary | ICD-10-CM | POA: Insufficient documentation

## 2016-09-08 DIAGNOSIS — I4891 Unspecified atrial fibrillation: Secondary | ICD-10-CM

## 2016-09-08 DIAGNOSIS — I5032 Chronic diastolic (congestive) heart failure: Secondary | ICD-10-CM | POA: Insufficient documentation

## 2016-09-08 DIAGNOSIS — E119 Type 2 diabetes mellitus without complications: Secondary | ICD-10-CM | POA: Insufficient documentation

## 2016-09-08 HISTORY — PX: TEE WITHOUT CARDIOVERSION: SHX5443

## 2016-09-08 HISTORY — PX: CARDIOVERSION: SHX1299

## 2016-09-08 LAB — GLUCOSE, CAPILLARY: Glucose-Capillary: 129 mg/dL — ABNORMAL HIGH (ref 65–99)

## 2016-09-08 SURGERY — ECHOCARDIOGRAM, TRANSESOPHAGEAL
Anesthesia: General

## 2016-09-08 MED ORDER — SODIUM CHLORIDE 0.9 % IV SOLN: INTRAVENOUS | Status: DC

## 2016-09-08 MED ORDER — PROPOFOL 500 MG/50ML IV EMUL
INTRAVENOUS | Status: DC | PRN
  Administered 2016-09-08: 100 ug/kg/min via INTRAVENOUS

## 2016-09-08 MED ORDER — PERFLUTREN LIPID MICROSPHERE
INTRAVENOUS | Status: DC | PRN
  Administered 2016-09-08: 2 mL via INTRAVENOUS

## 2016-09-08 MED ORDER — BUTAMBEN-TETRACAINE-BENZOCAINE 2-2-14 % EX AERO
INHALATION_SPRAY | CUTANEOUS | Status: DC | PRN
  Administered 2016-09-08: 2 via TOPICAL

## 2016-09-08 MED ORDER — SODIUM CHLORIDE 0.9 % IV SOLN
INTRAVENOUS | Status: DC | PRN
  Administered 2016-09-08: 11:00:00 via INTRAVENOUS

## 2016-09-08 MED ORDER — PERFLUTREN LIPID MICROSPHERE
INTRAVENOUS | Status: AC
  Filled 2016-09-08: qty 10

## 2016-09-08 NOTE — Progress Notes (Signed)
  Echocardiogram Echocardiogram Transesophageal with definity has been performed.  Darlina Sicilian M 09/08/2016, 12:36 PM

## 2016-09-08 NOTE — Procedures (Signed)
Electrical Cardioversion Procedure Note Tyion Boylen [id number] [birth year]  Procedure: Electrical Cardioversion Indications:  Atrial Fibrillation  Procedure Details Consent: Risks of procedure as well as the alternatives and risks of each were explained to the (patient/caregiver).  Consent for procedure obtained. Time Out: Verified patient identification, verified procedure, site/side was marked, verified correct patient position, special equipment/implants available, medications/allergies/relevent history reviewed, required imaging and test results available.  Performed  Patient placed on cardiac monitor, pulse oximetry, supplemental oxygen as necessary.  Sedation given: Propofol per anesthesiology Pacer pads placed anterior and posterior chest.  Cardioverted 1 time(s).  Cardioverted at [place].  Evaluation Findings: Post procedure EKG shows: NSR Complications: None Patient did tolerate procedure well.   Loralie Champagne 09/08/2016, 12:11 PM

## 2016-09-08 NOTE — CV Procedure (Signed)
Procedure: TEE  Indication: Atrial fibrillation  Sedation: Per anesthesiology.  Findings: Please see echo section for full report.  Normal LV size with EF 40-45%, diffuse hypokinesis (seems improved).  Mildly dilated RV with mildly decreased systolic function.  Mild left atrial enlargement.  No LA appendage thrombus (this was confirmed using Definity echo contrast).  Mildly dilated right atrium.  There was mild mitral regurgitation.  Trileaflet aortic valve with trivial AI, no aortic stenosis.  Trivial tricuspid regurgitation.  The thoracic aorta was normal in caliber with mild plaque.   Impression: No LA appendage thrombus, proceed to DCCV.   Darius [name] 09/08/2016 12:11 PM

## 2016-09-08 NOTE — Anesthesia Procedure Notes (Signed)
Procedure Name: MAC Date/Time: 09/08/2016 11:41 AM Performed by: Carney Living Pre-anesthesia Checklist: Patient identified, Emergency Drugs available, Suction available, Patient being monitored and Timeout performed Patient Re-evaluated:Patient Re-evaluated prior to induction Oxygen Delivery Method: Simple face mask

## 2016-09-08 NOTE — Discharge Instructions (Signed)
TEE ° °YOU HAD AN CARDIAC PROCEDURE TODAY: Refer to the procedure report and other information in the discharge instructions given to you for any specific questions about what was found during the examination. If this information does not answer your questions, please call [facility] office at [phone] to clarify.  ° °DIET: Your first meal following the procedure should be a light meal and then it is ok to progress to your normal diet. A half-sandwich or bowl of soup is an example of a good first meal. Heavy or fried foods are harder to digest and may make you feel nauseous or bloated. Drink plenty of fluids but you should avoid alcoholic beverages for 24 hours. If you had a esophageal dilation, please see attached instructions for diet.  ° °ACTIVITY: Your care partner should take you home directly after the procedure. You should plan to take it easy, moving slowly for the rest of the day. You can resume normal activity the day after the procedure however YOU SHOULD NOT DRIVE, use power tools, machinery or perform tasks that involve climbing or major physical exertion for 24 hours (because of the sedation medicines used during the test).  ° °SYMPTOMS TO REPORT IMMEDIATELY: °A cardiologist can be reached at any hour. Please call [phone] for any of the following symptoms:  °Vomiting of blood or coffee ground material  °New, significant abdominal pain  °New, significant chest pain or pain under the shoulder blades  °Painful or persistently difficult swallowing  °New shortness of breath  °Black, tarry-looking or red, bloody stools ° °FOLLOW UP:  °Please also call with any specific questions about appointments or follow up tests. ° °Electrical Cardioversion, Care After °This sheet gives you information about how to care for yourself after your procedure. Your health care provider may also give you more specific instructions. If you have problems or questions, contact your health care provider. °What can I  expect after the procedure? °After the procedure, it is common to have: °· Some redness on the skin where the shocks were given. ° °Follow these instructions at home: °· Do not drive for 24 hours if you were given a medicine to help you relax (sedative). °· Take over-the-counter and prescription medicines only as told by your health care provider. °· Ask your health care provider how to check your pulse. Check it often. °· Rest for 48 hours after the procedure or as told by your health care provider. °· Avoid or limit your caffeine use as told by your health care provider. °Contact a health care provider if: °· You feel like your heart is beating too quickly or your pulse is not regular. °· You have a serious muscle cramp that does not go away. °Get help right away if: °· You have discomfort in your chest. °· You are dizzy or you feel faint. °· You have trouble breathing or you are short of breath. °· Your speech is slurred. °· You have trouble moving an arm or leg on one side of your body. °· Your fingers or toes turn cold or blue. °This information is not intended to replace advice given to you by your health care provider. Make sure you discuss any questions you have with your health care provider. °Document Released: 11/17/2012 Document Revised: 08/31/2015 Document Reviewed: 08/03/2015 °Elsevier Interactive Patient Education © 2018 [employer] ° °

## 2016-09-08 NOTE — Anesthesia Preprocedure Evaluation (Addendum)
Anesthesia Evaluation  Patient identified by MRN, date of birth, ID band Patient awake    Reviewed: Allergy & Precautions, NPO status , Patient's Chart, lab work & pertinent test results, reviewed documented beta blocker date and time   Airway Mallampati: IV  TM Distance: <3 FB Neck ROM: Full  Mouth opening: Limited Mouth Opening  Dental  (+) Missing, Poor Dentition, Loose, Dental Advisory Given   Pulmonary sleep apnea and Continuous Positive Airway Pressure Ventilation ,     + decreased breath sounds      Cardiovascular Exercise Tolerance: Poor +CHF  + dysrhythmias Atrial Fibrillation  Rhythm:Irregular Rate:Tachycardia  Hx of noncompliance , now c EF 15%   Neuro/Psych    GI/Hepatic   Endo/Other  diabetes, Type 2Morbid obesity  Renal/GU      Musculoskeletal   Abdominal (+) + obese,   Peds  Hematology   Anesthesia Other Findings   Reproductive/Obstetrics                            Anesthesia Physical  Anesthesia Plan  ASA: IV  Anesthesia Plan: General   Post-op Pain Management:    Induction: Intravenous  PONV Risk Score and Plan:   Airway Management Planned: Mask  Additional Equipment:   Intra-op Plan:   Post-operative Plan:   Informed Consent: I have reviewed the patients History and Physical, chart, labs and discussed the procedure including the risks, benefits and alternatives for the proposed anesthesia with the patient or authorized representative who has indicated his/her understanding and acceptance.   Dental advisory given  Plan Discussed with: CRNA, Anesthesiologist and Surgeon  Anesthesia Plan Comments: (Difficult airway anticipated; used MAC w propofol prior Had LMA prior LV EF: 25% -   30%)      Anesthesia Quick Evaluation

## 2016-09-08 NOTE — H&P (Signed)
Physician History and Physical    Darius [name] MRN: [id number] DOB/AGE: 1955/02/24 61 y.o. Admit date: 09/08/2016  HPI: 61 yo with nonischemic cardiomyopathy and persistent atrial fibrillation presents for TEE-guided DCCV.  He had an atrial appendage thrombus when last attempted but had missed doses of Eliquis.  He has now been taking Eliquis without missing. He remains in atrial fibrillation.   Past Medical History:  Diagnosis Date  . Atrial fibrillation ([place])    unable to tolerate versed and fentanyl sedation and required gen anesthesia for TEE;  s/p TEE-DCCV (failed);  Amiodarone started  . Chronic diastolic heart failure ([place])    a. Echo 03/27/11: EF 50-55%, inferior hypokinesis, moderate LAE, mild RVE, normal pulmonary pressures. ;   b.  TEE (04/03/11): EF 40-45%, mild MR moderate LAE, no LAA clot, mild RAE;  c.  Echocardiogram (02/2013): EF 60-65%, Gr 2 DD, mildly dilated Ao root (Ao root dimension 39 mm), MAC, mild LAE, normal RVF  . Diabetes mellitus, type 2 ([place])   . Hx of cardiovascular stress test    Lexiscan Myoview (02/2013): No ischemia or scar, EF 51%, low risk  . Morbid obesity ([place])   . Sleep apnea    a. sleep test 01/2013:  very severy OSA     Review of systems complete and found to be negative unless listed above   Family History  Problem Relation Age of Onset  . Diabetes Father        died in his 74s  . Hypertension Father        died in her 60s  . Seizures Mother   . Hypertension Sister   . Hypertension Paternal Grandfather   . Heart attack Neg Hx   . Stroke Neg Hx     Social History   Social History  . Marital status: Single    Spouse name: N/A  . Number of children: N/A  . Years of education: N/A   Occupational History  . unemployeed    Social History Main Topics  . Smoking status: Never Smoker  . Smokeless tobacco: Never Used  . Alcohol use Yes     Comment: some  . Drug use: No  . Sexual activity: No   Other Topics Concern  . Not on  file   Social History Narrative   Admits that he eats poorly and eats a lot at work.      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     Prescriptions Prior to Admission  Medication Sig Dispense Refill Last Dose  . amiodarone (PACERONE) 200 MG tablet Take 1 tablet (200 mg total) by mouth daily. 90 tablet 2 09/08/2016 at Unknown time  . apixaban (ELIQUIS) 5 MG TABS tablet Take 1 tablet (5 mg total) by mouth 2 (two) times daily. 180 tablet 3 09/08/2016 at Unknown time  . atorvastatin (LIPITOR) 80 MG tablet Take 1 tablet (80 mg total) by mouth at bedtime. 30 tablet 2 09/08/2016 at Unknown time  . digoxin (LANOXIN) 0.125 MG tablet Take 1 tablet (0.125 mg total) by mouth daily. 30 tablet 4 09/08/2016 at Unknown time  . furosemide (LASIX) 40 MG tablet Take 1 tablet (40 mg total) by mouth daily. 30 tablet 4 09/08/2016 at Unknown time  . Krill Oil (OMEGA-3) 500 MG CAPS Take 1 capsule by mouth daily.   09/08/2016 at Unknown time  . metoprolol succinate (TOPROL-XL) 50 MG 24 hr tablet Take 1 tablet (50 mg total) by mouth 2 (two) times  daily. Take with or immediately following a meal. 60 tablet 4 09/08/2016 at Unknown time  . sacubitril-valsartan (ENTRESTO) 49-51 MG Take 1 tablet by mouth 2 (two) times daily. 60 tablet 11 09/08/2016 at Unknown time  . spironolactone (ALDACTONE) 25 MG tablet Take 1 tablet (25 mg total) by mouth daily. 30 tablet 2 09/08/2016 at Unknown time  . metFORMIN (GLUCOPHAGE) 1000 MG tablet take 1 tablet by mouth twice a day with food (Patient not taking: Reported on 08/08/2016) 60 tablet 3 Not Taking    Physical Exam: There were no vitals taken for this visit.  General: NAD Neck: No JVD, no thyromegaly or thyroid nodule.  Lungs: Clear to auscultation bilaterally with normal respiratory effort. CV: Nondisplaced PMI.  Heart irregular S1/S2, no S3/S4, no murmur.  No peripheral edema.  No carotid bruit.  Normal pedal pulses.  Abdomen: Soft, nontender, no  hepatosplenomegaly, no distention.  Skin: Intact without lesions or rashes.  Neurologic: Alert and oriented x 3.  Psych: Normal affect. Extremities: No clubbing or cyanosis.  HEENT: Normal.   Labs:   Lab Results  Component Value Date   WBC 8.0 04/24/2016   HGB 16.6 04/24/2016   HCT 50.0 04/24/2016   MCV 85.2 04/24/2016   PLT 232 04/24/2016   No results for input(s): NA, K, CL, CO2, BUN, CREATININE, CALCIUM, PROT, BILITOT, ALKPHOS, ALT, AST, GLUCOSE in the last 168 hours.  Invalid input(s): LABALBU Lab Results  Component Value Date   CKTOTAL 822 (H) 03/27/2011   CKMB 7.5 (HH) 03/27/2011   TROPONINI 1.67 (HH) 04/18/2016    Lab Results  Component Value Date   CHOL 130 04/18/2016   CHOL 191 06/05/2014   CHOL 167 02/11/2013   Lab Results  Component Value Date   HDL 35 (L) 04/18/2016   HDL 50 06/05/2014   HDL 43.20 02/11/2013   Lab Results  Component Value Date   LDLCALC 78 04/18/2016   LDLCALC 121 (H) 06/05/2014   LDLCALC 98 02/11/2013   Lab Results  Component Value Date   TRIG 86 04/18/2016   TRIG 102 06/05/2014   TRIG 131.0 02/11/2013   Lab Results  Component Value Date   CHOLHDL 3.7 04/18/2016   CHOLHDL 3.8 06/05/2014   CHOLHDL 4 02/11/2013   No results found for: LDLDIRECT    ASSESSMENT AND PLAN: 61 yo with nonischemic nonischemic cardiomyopathy and persistent atrial fibrillation presents for TEE-guided DCCV.   Signed: Loralie Champagne 09/08/2016, 11:46 AM

## 2016-09-08 NOTE — Transfer of Care (Signed)
Immediate Anesthesia Transfer of Care Note  Patient: Darius [name]  Procedure(s) Performed: Procedure(s): TRANSESOPHAGEAL ECHOCARDIOGRAM (TEE) (N/A) CARDIOVERSION (N/A)  Patient Location: Endoscopy Unit  Anesthesia Type:MAC  Level of Consciousness: awake, alert , oriented and patient cooperative  Airway & Oxygen Therapy: Patient Spontanous Breathing and Patient connected to nasal cannula oxygen  Post-op Assessment: Report given to RN, Patient moving all extremities X 4 and BP low 84/54, Pt awake alert, feels fine, Dr Orene Desanctis notified of low BP  Post vital signs: Reviewed  Last Vitals:  Vitals:   09/08/16 1225 09/08/16 1230  BP: (!) 85/50 (!) 91/47  Pulse: 66 (!) 55  Resp: 13 17  Temp: 36.5 C     Last Pain:  Vitals:   09/08/16 1225  TempSrc: Oral         Complications: No apparent anesthesia complications

## 2016-09-09 ENCOUNTER — Encounter (HOSPITAL_COMMUNITY): Payer: Self-pay | Admitting: Cardiology

## 2016-09-12 ENCOUNTER — Other Ambulatory Visit (HOSPITAL_COMMUNITY): Payer: Self-pay

## 2016-09-12 NOTE — Progress Notes (Signed)
Paramedicine Encounter    Patient ID: Darius [name], male    DOB: Jun 16, 1955, 61 y.o.   MRN: [id number]    Patient Care Team: [name], DO as PCP - General (Family Medicine) [name], MD as Consulting Physician (Cardiology)  Patient Active Problem List   Diagnosis Date Noted  . Atrial fibrillation with RVR ([place]) 04/17/2016  . Elevated troponin 04/17/2016  . CHF (congestive heart failure) ([place]) 04/17/2016  . Septic olecranon bursitis of right elbow 09/19/2015  . Lactic acid acidosis   . Dilated cardiomyopathy ([place]) 04/19/2014  . Type 2 diabetes mellitus ([place]) 04/09/2014  . Non compliance with medical treatment and diet 04/09/2014  . Chronic anticoagulation-Eliquis started 04/06/14 04/09/2014  . Abnormal chest CT-LUL nodule- needs f/u May 2016 04/09/2014  . Acute on chronic combined systolic and diastolic heart failure ([place]) 04/05/2014  . OSA - C-pap 03/22/2013  . Persistent atrial fibrillation ([place]) 03/27/2011  . Obesity, Class III, BMI 40-49.9 (morbid obesity) ([place]) 03/27/2011  . URI, acute 03/27/2011    Current Outpatient Prescriptions:  .  amiodarone (PACERONE) 200 MG tablet, Take 1 tablet (200 mg total) by mouth daily., Disp: 90 tablet, Rfl: 2 .  apixaban (ELIQUIS) 5 MG TABS tablet, Take 1 tablet (5 mg total) by mouth 2 (two) times daily., Disp: 180 tablet, Rfl: 3 .  atorvastatin (LIPITOR) 80 MG tablet, Take 1 tablet (80 mg total) by mouth at bedtime., Disp: 30 tablet, Rfl: 2 .  digoxin (LANOXIN) 0.125 MG tablet, Take 1 tablet (0.125 mg total) by mouth daily., Disp: 30 tablet, Rfl: 4 .  furosemide (LASIX) 40 MG tablet, Take 1 tablet (40 mg total) by mouth daily., Disp: 30 tablet, Rfl: 4 .  metoprolol succinate (TOPROL-XL) 50 MG 24 hr tablet, Take 1 tablet (50 mg total) by mouth 2 (two) times daily. Take with or immediately following a meal., Disp: 60 tablet, Rfl: 4 .  sacubitril-valsartan (ENTRESTO) 49-51 MG, Take 1 tablet by mouth 2 (two) times daily., Disp: 60  tablet, Rfl: 11 .  spironolactone (ALDACTONE) 25 MG tablet, Take 1 tablet (25 mg total) by mouth daily., Disp: 30 tablet, Rfl: 2 .  Krill Oil (OMEGA-3) 500 MG CAPS, Take 1 capsule by mouth daily., Disp: , Rfl:  .  metFORMIN (GLUCOPHAGE) 1000 MG tablet, take 1 tablet by mouth twice a day with food (Patient not taking: Reported on 08/08/2016), Disp: 60 tablet, Rfl: 3 No Known Allergies   Social History   Social History  . Marital status: Single    Spouse name: N/A  . Number of children: N/A  . Years of education: N/A   Occupational History  . unemployeed    Social History Main Topics  . Smoking status: Never Smoker  . Smokeless tobacco: Never Used  . Alcohol use Yes     Comment: some  . Drug use: No  . Sexual activity: No   Other Topics Concern  . Not on file   Social History Narrative   Admits that he eats poorly and eats a lot at work.      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    Physical Exam  Pulmonary/Chest: No respiratory distress.  Abdominal: He exhibits no distension. There is no tenderness. There is no guarding.  Musculoskeletal: He exhibits no edema.  Skin: Skin is warm and dry. He is not diaphoretic.        Future Appointments Date Time Provider [place]  09/19/2016 10:30 AM [facility] PA/NP [facility] None  ATF pt CAO x4 with no complaints.  [facility] due to him not being comfortable with house visits.   Pt has taken all of his medications without missing any this week.  He stated that he had a cake last night and he was worried about his blood sugar, which was within normal limits.  Pt denies sob, dizziness, headache and chest pain. rx bottles verified and pill box refilled. Pt's medication ordered and will be delivered next tues.  BP (!) 142/90 (BP Location: Right Arm, Patient Position: Sitting, Cuff Size: Normal)   Pulse (!) 57   Resp 20   Wt 297 lb (134.7 kg)   SpO2 98%   BMI 38.13 kg/m   cbg  114 **rx called in: entresto  Weight yesterday-298 Last visit [id number]    Lilyanna Lunt, EMT Paramedic 09/12/2016    ACTION: Home visit completed

## 2016-09-14 NOTE — Anesthesia Postprocedure Evaluation (Signed)
Anesthesia Post Note  Patient: Darius [name]  Procedure(s) Performed: Procedure(s) (LRB): TRANSESOPHAGEAL ECHOCARDIOGRAM (TEE) (N/A) CARDIOVERSION (N/A)     Patient location during evaluation: PACU Anesthesia Type: General Level of consciousness: awake and alert Pain management: pain level controlled Vital Signs Assessment: post-procedure vital signs reviewed and stable Respiratory status: spontaneous breathing, nonlabored ventilation, respiratory function stable and patient connected to nasal cannula oxygen Cardiovascular status: stable and blood pressure returned to baseline Anesthetic complications: no    Last Vitals:  Vitals:   09/08/16 1245 09/08/16 1255  BP: (!) 97/49 100/62  Pulse: (!) 56 64  Resp: 15 14  Temp:      Last Pain:  Vitals:   09/08/16 1225  TempSrc: Oral                 Darius [name],JAMES TERRILL

## 2016-09-19 ENCOUNTER — Other Ambulatory Visit (HOSPITAL_COMMUNITY): Payer: Self-pay | Admitting: Pharmacist

## 2016-09-19 ENCOUNTER — Other Ambulatory Visit (HOSPITAL_COMMUNITY): Payer: Self-pay

## 2016-09-19 ENCOUNTER — Encounter (HOSPITAL_COMMUNITY): Payer: Self-pay

## 2016-09-19 MED ORDER — SACUBITRIL-VALSARTAN 49-51 MG PO TABS: 1.0000 | ORAL_TABLET | Freq: Two times a day (BID) | ORAL | 11 refills | Status: DC

## 2016-09-19 NOTE — Progress Notes (Signed)
Paramedicine Encounter    Patient ID: Darius [name], male    DOB: Nov 10, 1955, 61 y.o.   MRN: [id number]   Patient Care Team: Darius Kern, DO as PCP - General (Family Medicine) Darius Dresser, MD as Consulting Physician (Cardiology)  Patient Active Problem List   Diagnosis Date Noted  . Atrial fibrillation with RVR (Darius [name]) 04/17/2016  . Elevated troponin 04/17/2016  . CHF (congestive heart failure) (Darius [name]) 04/17/2016  . Septic olecranon bursitis of right elbow 09/19/2015  . Lactic acid acidosis   . Dilated cardiomyopathy (Darius [name]) 04/19/2014  . Type 2 diabetes mellitus (Darius [name]) 04/09/2014  . Non compliance with medical treatment and diet 04/09/2014  . Chronic anticoagulation-Darius [name] started 04/06/14 04/09/2014  . Abnormal chest CT-LUL nodule- needs f/u May 2016 04/09/2014  . Acute on chronic combined systolic and diastolic heart failure (Darius [name]) 04/05/2014  . OSA - C-pap 03/22/2013  . Persistent atrial fibrillation (Darius [name]) 03/27/2011  . Obesity, Class III, BMI 40-49.9 (morbid obesity) (Darius [name]) 03/27/2011  . URI, acute 03/27/2011    Current Outpatient Prescriptions:  .  amiodarone (PACERONE) 200 MG tablet, Take 1 tablet (200 mg total) by mouth daily., Disp: 90 tablet, Rfl: 2 .  apixaban (Darius [name]) 5 MG TABS tablet, Take 1 tablet (5 mg total) by mouth 2 (two) times daily., Disp: 180 tablet, Rfl: 3 .  atorvastatin (LIPITOR) 80 MG tablet, Take 1 tablet (80 mg total) by mouth at bedtime., Disp: 30 tablet, Rfl: 2 .  digoxin (LANOXIN) 0.125 MG tablet, Take 1 tablet (0.125 mg total) by mouth daily., Disp: 30 tablet, Rfl: 4 .  furosemide (LASIX) 40 MG tablet, Take 1 tablet (40 mg total) by mouth daily., Disp: 30 tablet, Rfl: 4 .  Krill Oil (OMEGA-3) 500 MG CAPS, Take 1 capsule by mouth daily., Disp: , Rfl:  .  metFORMIN (GLUCOPHAGE) 1000 MG tablet, take 1 tablet by mouth twice a day with food (Patient not taking: Reported on 08/08/2016), Disp: 60 tablet, Rfl: 3 .  metoprolol succinate (TOPROL-XL) 50 MG 24 hr  tablet, Take 1 tablet (50 mg total) by mouth 2 (two) times daily. Take with or immediately following a meal., Disp: 60 tablet, Rfl: 4 .  sacubitril-valsartan (ENTRESTO) 49-51 MG, Take 1 tablet by mouth 2 (two) times daily., Disp: 60 tablet, Rfl: 11 .  spironolactone (ALDACTONE) 25 MG tablet, Take 1 tablet (25 mg total) by mouth daily., Disp: 30 tablet, Rfl: 2 No Known Allergies   Social History   Social History  . Marital status: Single    Spouse name: N/A  . Number of children: N/A  . Years of education: N/A   Occupational History  . unemployeed    Social History Main Topics  . Smoking status: Never Smoker  . Smokeless tobacco: Never Used  . Alcohol use Yes     Comment: some  . Drug use: No  . Sexual activity: No   Other Topics Concern  . Not on file   Social History Narrative   Admits that he eats poorly and eats a lot at work.      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    Physical Exam  Constitutional: He is oriented to person, place, and time.  Cardiovascular: Normal rate and regular rhythm.   Pulmonary/Chest: Effort normal and breath sounds normal. No respiratory distress. He has no wheezes. He has no rales.  Abdominal: Soft.  Musculoskeletal: Normal range of motion. He exhibits no edema.  Neurological: He is alert and oriented to person,  place, and time.  Skin: Skin is warm and dry.  Psychiatric: He has a normal mood and affect.        No future appointments. There were no vitals taken for this visit. Weight yesterday- 292 lbs Last visit weight- 297 lbs  [name] He reports feeling well. He denied SOB, H/A, dizziness or orthopnea. He was recently cardioverted by [name] from a-fib to NSR on 09/08/16 and was scheduled to have a follow up at the clinic today. He inadvertently canceled his appointment via the automated reminder system and the clinic had already filled his appointment slot when I saw the  cancellation. I spoke with the clinic and they requested an ECG be done to ensure that Darius [name] was still in a sinus rhythm. The ECG was performed and showed a normal sinus rhythm. Medications were verified and it was found that Entresto was delivered in the wrong dosage. Darius [name] was contacted and was going to resend the prescription to the pharmacy but did not see why a different dose was delivered. For the time being, I will double the number of pills in his box to meet the prescribed dose of 49 mg/51 mg. Pillbox was refilled accordingly.   Time spent with patient: 35 minutes  Darius [name], EMT 09/19/16  ACTION: Next visit planned for 1 week

## 2016-09-25 NOTE — Telephone Encounter (Signed)
Opened in error

## 2016-09-26 ENCOUNTER — Telehealth (HOSPITAL_COMMUNITY): Payer: Self-pay | Admitting: *Deleted

## 2016-09-26 ENCOUNTER — Other Ambulatory Visit (HOSPITAL_COMMUNITY): Payer: Self-pay

## 2016-09-26 MED FILL — AMIODARONE HCL 200 MG TAB: 200 | 34 days supply | Qty: 34 | Fill #1

## 2016-09-26 MED FILL — FUROSEMIDE 40 MG TABLET: 40 | 34 days supply | Qty: 34 | Fill #2

## 2016-09-26 MED FILL — ATORVASTATIN 80 MG TABLET: 80 | 34 days supply | Qty: 34 | Fill #2

## 2016-09-26 NOTE — Telephone Encounter (Signed)
Darius [name] with paramedicine called he went to see pt and pt wasn't feeling well. He got an ekg and saw that the patient was in afib. Patient was cardioverted 2 weeks ago.Last week patient was feeling fine and in normal sinus rhythm. Patient has not missed any doses of medications.  I contacted Jonni Sanger and [place] for advice since Dr.McLean was out of the office. The both advised patient take amiodarone 200mg  bid and to schedule a cardioversion next week with Dr.McLean. Patient has transportation issues and doesn't know if he will have somebody to drive him to and from cardioversion. Edwyna Ready will call Monday with an update on transportation. Dr.McLean will be back Monday and will further discuss with him then.  Message routed to [place]

## 2016-09-26 NOTE — Progress Notes (Signed)
Paramedicine Encounter    Patient ID: Ari Bernabei, male    DOB: 05/29/1955, 61 y.o.   MRN: [id number]   Patient Care Team: [name], DO as PCP - General (Family Medicine) [name], MD as Consulting Physician (Cardiology)  Patient Active Problem List   Diagnosis Date Noted  . Atrial fibrillation with RVR ([place]) 04/17/2016  . Elevated troponin 04/17/2016  . CHF (congestive heart failure) ([place]) 04/17/2016  . Septic olecranon bursitis of right elbow 09/19/2015  . Lactic acid acidosis   . Dilated cardiomyopathy ([place]) 04/19/2014  . Type 2 diabetes mellitus ([place]) 04/09/2014  . Non compliance with medical treatment and diet 04/09/2014  . Chronic anticoagulation-Eliquis started 04/06/14 04/09/2014  . Abnormal chest CT-LUL nodule- needs f/u May 2016 04/09/2014  . Acute on chronic combined systolic and diastolic heart failure ([place]) 04/05/2014  . OSA - C-pap 03/22/2013  . Persistent atrial fibrillation ([place]) 03/27/2011  . Obesity, Class III, BMI 40-49.9 (morbid obesity) ([place]) 03/27/2011  . URI, acute 03/27/2011    Current Outpatient Prescriptions:  .  amiodarone (PACERONE) 200 MG tablet, Take 1 tablet (200 mg total) by mouth daily., Disp: 90 tablet, Rfl: 2 .  apixaban (ELIQUIS) 5 MG TABS tablet, Take 1 tablet (5 mg total) by mouth 2 (two) times daily., Disp: 180 tablet, Rfl: 3 .  atorvastatin (LIPITOR) 80 MG tablet, Take 1 tablet (80 mg total) by mouth at bedtime., Disp: 30 tablet, Rfl: 2 .  digoxin (LANOXIN) 0.125 MG tablet, Take 1 tablet (0.125 mg total) by mouth daily., Disp: 30 tablet, Rfl: 4 .  furosemide (LASIX) 40 MG tablet, Take 1 tablet (40 mg total) by mouth daily., Disp: 30 tablet, Rfl: 4 .  metoprolol succinate (TOPROL-XL) 50 MG 24 hr tablet, Take 1 tablet (50 mg total) by mouth 2 (two) times daily. Take with or immediately following a meal., Disp: 60 tablet, Rfl: 4 .  sacubitril-valsartan (ENTRESTO) 49-51 MG, Take 1 tablet by mouth 2 (two) times daily., Disp: 60  tablet, Rfl: 11 .  spironolactone (ALDACTONE) 25 MG tablet, Take 1 tablet (25 mg total) by mouth daily., Disp: 30 tablet, Rfl: 2 .  Krill Oil (OMEGA-3) 500 MG CAPS, Take 1 capsule by mouth daily., Disp: , Rfl:  .  metFORMIN (GLUCOPHAGE) 1000 MG tablet, take 1 tablet by mouth twice a day with food (Patient not taking: Reported on 08/08/2016), Disp: 60 tablet, Rfl: 3 No Known Allergies   Social History   Social History  . Marital status: Single    Spouse name: N/A  . Number of children: N/A  . Years of education: N/A   Occupational History  . unemployeed    Social History Main Topics  . Smoking status: Never Smoker  . Smokeless tobacco: Never Used  . Alcohol use Yes     Comment: some  . Drug use: No  . Sexual activity: No   Other Topics Concern  . Not on file   Social History Narrative   Admits that he eats poorly and eats a lot at work.      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    Physical Exam  Constitutional: He is oriented to person, place, and time.  Cardiovascular: Normal rate.  An irregular rhythm present.  Pulmonary/Chest: Effort normal and breath sounds normal. No respiratory distress. He has no wheezes. He has no rales.  Abdominal: Soft. He exhibits no distension.  Musculoskeletal: Normal range of motion.  Neurological: He is alert and oriented to  person, place, and time.  Skin: Skin is warm.        Future Appointments Date Time Provider [place]  10/06/2016 9:30 AM [facility] PA/NP [facility] None   BP 104/60 (BP Location: Right Arm, Patient Position: Sitting, Cuff Size: Normal)   Pulse 77   Resp 18   Wt 286 lb (129.7 kg)   SpO2 98%   BMI 36.72 kg/m  Weight yesterday- 284 lbs Last visit weight- 287 lbs CBG- 134 mg/dl  Mr [facility]nd[facility]. He reports feeling generally well. Denied SOB, dizziness headache or orthopnea. He reports being compliant with his medications. Upon checking his vital signs, his  radial pulse was irregular prompting me to place him on the cardiac monitor and he was found to be in atrial fibrillation. I contacted to HF clinic who advise to increase Amiodarone to 200 mg BID and schedule a cardioversion next week. Due to Mr Mcclish has difficulty finding someone to bring him to these appointments, a tentative date was scheduled for August 23. We tried reaching his friend, Rosann Auerbach, but she did not answer her phone. I will follow up on Monday to see if he has secured a ride.   Time spent with patient: 50 minutes  Jacquiline Doe, EMT 09/26/16  ACTION: Home visit completed Next visit planned for 1 week

## 2016-09-28 NOTE — Telephone Encounter (Signed)
He needs to have cardioversion arranged for Thursday or Friday.  Take amiodarone 200 mg bid and make sure that he does not miss any of his anticoagulant.

## 2016-09-30 ENCOUNTER — Other Ambulatory Visit (HOSPITAL_COMMUNITY): Payer: Self-pay | Admitting: *Deleted

## 2016-09-30 NOTE — Telephone Encounter (Signed)
Pt scheduled for 8/23 at 2pm

## 2016-10-02 ENCOUNTER — Encounter (HOSPITAL_COMMUNITY): Payer: Self-pay

## 2016-10-02 ENCOUNTER — Ambulatory Visit (HOSPITAL_COMMUNITY)
Admission: RE | Admit: 2016-10-02 | Discharge: 2016-10-02 | Disposition: A | Discharge status: Home / Self Care | Payer: Self-pay | Source: Ambulatory Visit | Attending: Internal Medicine | Admitting: Internal Medicine

## 2016-10-02 ENCOUNTER — Ambulatory Visit (HOSPITAL_COMMUNITY): Admit: 2016-10-02 | Payer: MEDICAID | Admitting: Cardiology

## 2016-10-02 ENCOUNTER — Encounter (HOSPITAL_COMMUNITY)
Admission: RE | Disposition: A | Discharge status: Home / Self Care | Payer: Self-pay | Source: Ambulatory Visit | Attending: Internal Medicine

## 2016-10-02 DIAGNOSIS — Z538 Procedure and treatment not carried out for other reasons: Secondary | ICD-10-CM | POA: Insufficient documentation

## 2016-10-02 DIAGNOSIS — I480 Paroxysmal atrial fibrillation: Secondary | ICD-10-CM

## 2016-10-02 DIAGNOSIS — I4891 Unspecified atrial fibrillation: Secondary | ICD-10-CM | POA: Insufficient documentation

## 2016-10-02 SURGERY — CANCELLED PROCEDURE

## 2016-10-02 SURGERY — CARDIOVERSION
Anesthesia: General

## 2016-10-02 NOTE — Progress Notes (Signed)
Patient noted to be in NSR today prior to the cardioversion. The procedure was cancelled. He should follow-up with Dr. [name].  Pixie Casino, MD, [place]  Attending Cardiologist  Direct Dial: [phone]  Fax: [phone]  Website:  [email or web address]

## 2016-10-02 NOTE — Progress Notes (Signed)
While pt in pre-procedure, monitoring showed sinus brady. Confirmed Sinus Darius [name] with 12 lead EKG and Dr Debara Pickett shown 12 lead results.  Procedure cancelled in pre-procedure. Pt instructed to continue taking medications as prescribed and keep appointments. Shown how to take his pulse and instructed to contact physician with any problems or questions including if feels out of rhythm.

## 2016-10-03 ENCOUNTER — Other Ambulatory Visit (HOSPITAL_COMMUNITY): Payer: Self-pay | Admitting: Student

## 2016-10-03 ENCOUNTER — Other Ambulatory Visit (HOSPITAL_COMMUNITY): Payer: Self-pay

## 2016-10-03 ENCOUNTER — Telehealth (HOSPITAL_COMMUNITY): Payer: Self-pay

## 2016-10-03 MED FILL — SPIRONOLACTONE 25 MG TABLET: 25 | 30 days supply | Qty: 30 | Fill #2

## 2016-10-03 NOTE — Telephone Encounter (Signed)
Zach with paramedicine called to verify amio dose.  Looks like patient's DCCV was cancelled, therefor advised to keep him at [facility] twice daily and we will follow up as scheduled Monday to see if another DCCV is necessary to schedule. Patient also up 6 lbs overnight, states compliance with meds, advised per Dr. [name] to take extra 40 mg lasix x 1 dose today.  Renee Pain, RN

## 2016-10-03 NOTE — Progress Notes (Signed)
Paramedicine Encounter    Patient ID: Darius [name], male    DOB: October 17, 1955, 61 y.o.   MRN: [id number]   Patient Care Team: [name], DO as PCP - General (Family Medicine) [name], MD as Consulting Physician (Cardiology)  Patient Active Problem List   Diagnosis Date Noted  . Atrial fibrillation with RVR ([place]) 04/17/2016  . Elevated troponin 04/17/2016  . CHF (congestive heart failure) ([place]) 04/17/2016  . Septic olecranon bursitis of right elbow 09/19/2015  . Lactic acid acidosis   . Dilated cardiomyopathy ([place]) 04/19/2014  . Type 2 diabetes mellitus ([place]) 04/09/2014  . Non compliance with medical treatment and diet 04/09/2014  . Chronic anticoagulation-Eliquis started 04/06/14 04/09/2014  . Abnormal chest CT-LUL nodule- needs f/u May 2016 04/09/2014  . Acute on chronic combined systolic and diastolic heart failure ([place]) 04/05/2014  . OSA - C-pap 03/22/2013  . Persistent atrial fibrillation ([place]) 03/27/2011  . Obesity, Class III, BMI 40-49.9 (morbid obesity) ([place]) 03/27/2011  . URI, acute 03/27/2011    Current Outpatient Prescriptions:  .  amiodarone (PACERONE) 200 MG tablet, Take 1 tablet (200 mg total) by mouth daily., Disp: 90 tablet, Rfl: 2 .  apixaban (ELIQUIS) 5 MG TABS tablet, Take 1 tablet (5 mg total) by mouth 2 (two) times daily., Disp: 180 tablet, Rfl: 3 .  atorvastatin (LIPITOR) 80 MG tablet, Take 1 tablet (80 mg total) by mouth at bedtime., Disp: 30 tablet, Rfl: 2 .  digoxin (LANOXIN) 0.125 MG tablet, Take 1 tablet (0.125 mg total) by mouth daily., Disp: 30 tablet, Rfl: 4 .  furosemide (LASIX) 40 MG tablet, Take 1 tablet (40 mg total) by mouth daily., Disp: 30 tablet, Rfl: 4 .  metoprolol succinate (TOPROL-XL) 50 MG 24 hr tablet, Take 1 tablet (50 mg total) by mouth 2 (two) times daily. Take with or immediately following a meal., Disp: 60 tablet, Rfl: 4 .  sacubitril-valsartan (ENTRESTO) 49-51 MG, Take 1 tablet by mouth 2 (two) times daily., Disp: 60  tablet, Rfl: 11 .  spironolactone (ALDACTONE) 25 MG tablet, Take 1 tablet (25 mg total) by mouth daily., Disp: 30 tablet, Rfl: 2 .  Krill Oil (OMEGA-3) 500 MG CAPS, Take 1 capsule by mouth daily., Disp: , Rfl:  .  metFORMIN (GLUCOPHAGE) 1000 MG tablet, take 1 tablet by mouth twice a day with food (Patient not taking: Reported on 10/03/2016), Disp: 60 tablet, Rfl: 3 No Known Allergies   Social History   Social History  . Marital status: Single    Spouse name: N/A  . Number of children: N/A  . Years of education: N/A   Occupational History  . unemployeed    Social History Main Topics  . Smoking status: Never Smoker  . Smokeless tobacco: Never Used  . Alcohol use Yes     Comment: some  . Drug use: No  . Sexual activity: No   Other Topics Concern  . Not on file   Social History Narrative   Admits that he eats poorly and eats a lot at work.      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    Physical Exam  Constitutional: He is oriented to person, place, and time.  Cardiovascular: Normal rate and regular rhythm.   Pulmonary/Chest: Effort normal and breath sounds normal. No respiratory distress. He has no wheezes. He has no rales.  Abdominal: Soft.  Musculoskeletal: Normal range of motion. He exhibits edema.  Neurological: He is alert and oriented to person, place,  and time.  Skin: Skin is warm and dry.  Psychiatric: He has a normal mood and affect.        Future Appointments Date Time Provider [place]  10/06/2016 9:30 AM [facility] PA/NP [facility] None   BP 122/80 (BP Location: Right Arm, Patient Position: Sitting, Cuff Size: Normal)   Pulse (!) 56   Resp 18   Wt 291 lb (132 kg)   SpO2 98%   BMI 37.36 kg/m  Weight yesterday- 285 lbs Last visit weight- 286 lbs CBG- 156 mg/dl  [facility] and reports feeling generally well. He denied SOB, H/A, dizziness or orthopnea. He presented to his appointment for  cardioversion last week to find he had already converted into a sinus rhythm. He reports a weight increase of 6 lbs overnight the clinic was contacted and Jinny Blossom advised to have him take an extra furosemide tablet tonight. Furthermore she advised to continue Darius Bredeson on Amiodarone BID until his appointment on Monday. Medications were verified and his pillbox was refilled.  Medications ordered are Spironolactone, Entresto and Digoxin.  Time spent with patient: 46 minutes  Darius [name], EMT 10/03/16  ACTION: Home visit completed Next visit planned for 1 week

## 2016-10-06 ENCOUNTER — Encounter (HOSPITAL_COMMUNITY): Payer: Self-pay

## 2016-10-06 ENCOUNTER — Ambulatory Visit (HOSPITAL_COMMUNITY)
Admission: RE | Admit: 2016-10-06 | Discharge: 2016-10-06 | Disposition: A | Discharge status: Home / Self Care | Payer: Self-pay | Source: Ambulatory Visit | Attending: Cardiology | Admitting: Cardiology

## 2016-10-06 VITALS — BP 142/64 | HR 62 | Wt 297.0 lb

## 2016-10-06 DIAGNOSIS — Z79899 Other long term (current) drug therapy: Secondary | ICD-10-CM | POA: Insufficient documentation

## 2016-10-06 DIAGNOSIS — Z833 Family history of diabetes mellitus: Secondary | ICD-10-CM | POA: Insufficient documentation

## 2016-10-06 DIAGNOSIS — E119 Type 2 diabetes mellitus without complications: Secondary | ICD-10-CM | POA: Insufficient documentation

## 2016-10-06 DIAGNOSIS — I429 Cardiomyopathy, unspecified: Secondary | ICD-10-CM | POA: Insufficient documentation

## 2016-10-06 DIAGNOSIS — E785 Hyperlipidemia, unspecified: Secondary | ICD-10-CM | POA: Insufficient documentation

## 2016-10-06 DIAGNOSIS — I481 Persistent atrial fibrillation: Secondary | ICD-10-CM | POA: Insufficient documentation

## 2016-10-06 DIAGNOSIS — Z7901 Long term (current) use of anticoagulants: Secondary | ICD-10-CM | POA: Insufficient documentation

## 2016-10-06 DIAGNOSIS — R000 Tachycardia, unspecified: Secondary | ICD-10-CM | POA: Insufficient documentation

## 2016-10-06 DIAGNOSIS — Z8249 Family history of ischemic heart disease and other diseases of the circulatory system: Secondary | ICD-10-CM | POA: Insufficient documentation

## 2016-10-06 DIAGNOSIS — G4733 Obstructive sleep apnea (adult) (pediatric): Secondary | ICD-10-CM | POA: Insufficient documentation

## 2016-10-06 DIAGNOSIS — I5042 Chronic combined systolic (congestive) and diastolic (congestive) heart failure: Secondary | ICD-10-CM

## 2016-10-06 DIAGNOSIS — Z7984 Long term (current) use of oral hypoglycemic drugs: Secondary | ICD-10-CM | POA: Insufficient documentation

## 2016-10-06 DIAGNOSIS — I5022 Chronic systolic (congestive) heart failure: Secondary | ICD-10-CM | POA: Insufficient documentation

## 2016-10-06 DIAGNOSIS — I480 Paroxysmal atrial fibrillation: Secondary | ICD-10-CM

## 2016-10-06 MED ORDER — AMIODARONE HCL 200 MG PO TABS: 200.0000 mg | ORAL_TABLET | Freq: Every day | ORAL | 3 refills | Status: DC

## 2016-10-06 NOTE — Progress Notes (Signed)
Advanced Heart Failure Clinic Note   PCP: Darius Benton, DO  Primary HF Cardiologist:  Dr. [name]  HPI: 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   Admitted 0/9/32-6/71/24 for acute systolic CHF and atrial fibrillation with RVR. Had a R/L heart cath on 04/21/16, no CAD, right heart pressures elevated, low output. Echo showed EF 15%. Plan was to undergo TEE/DC-CV, however he was found to have a left atrial appendage thrombus and had only started Eliquis 5 days prior. He was discharged with plans for TEE/DC-CV in about a month. He was started on Entresto 24/8m BID during admission. Discharged home on Lasix 435mdaily, digoxin 0.12546mand metoprolol XL 38m40mD. Discharge 280 pounds.   Repeat TEE done in 4/18 for possible cardioversion.  EF remained 25-30%.  He still had a small, amorphous LA appendage thrombus (smaller than prior but still present).  He admitted to having missed some Eliquis doses.    Today he returns for heart failure follow up. Since the last visit he had TEE and DC-CV on July 30th with conversion to NSR. Overall feeling ok. Denies SOB/PND/Orthopnea. Weight at home 282-290 pounds. Eating low salt diet. Taking all medications. No BRBPR. Using CPAP nightly. Followed by Paramedicine.    Cardiac Studies  RHC/LHC 04/21/2016  RA mean 14 RV 45/15 PA 50/28, mean 36 PCWP mean 22 LV 104/20 AO 101/75 Oxygen saturations: PA 59% AO 92% Cardiac Output (Fick) 4.85  Cardiac Index (Fick) 1.89 PVR 2.9 WU  - TEE: 04/2016 with LAA thrombus.  - TEE: 4/18 with EF 25-30%, diffuse hypokinesis, RV mildly dilated with moderately decreased systolic function, amorphous thrombus LA appendage (improved compared to 3/18).  -TEE 09/08/2016 EF 40-45% No thrombus. Successful DC-CV  - ECHO 04/24/2016 EF 15% Peak PA pressure 38 mm hg   Labs    04/24/2016: K 4.5 Creatinine 1.00, digoxin < 0.2 4/18: K 4.3, creatinine 1.02  Review of systems complete and found to be negative unless listed in HPI.    SH:  Social History   Social History  . Marital status: Single    Spouse name: N/A  . Number of children: N/A  . Years of education: N/A   Occupational History  . unemployeed    Social History Main Topics  . Smoking status: Never Smoker  . Smokeless tobacco: Never Used  . Alcohol use Yes     Comment: some  . Drug use: No  . Sexual activity: No   Other Topics Concern  . Not on file   Social History Narrative   Admits that he eats poorly and eats a lot at work.      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 Diabetes Father        died in his 70s 44sHypertension Father        died in her 60s 28sSeizures Mother   . Hypertension Sister   . Hypertension Paternal Grandfather   . Heart attack Neg Hx   . Stroke Neg Hx     Past Medical History:  Diagnosis Date  . Atrial fibrillation (HCC)[place] unable to tolerate versed and fentanyl sedation and required gen anesthesia for TEE;  s/p TEE-DCCV (failed);  Amiodarone started  .  Chronic diastolic heart failure ([place])    a. Echo 03/27/11: EF 50-55%, inferior hypokinesis, moderate LAE, mild RVE, normal pulmonary pressures. ;   b.  TEE (04/03/11): EF 40-45%, mild MR moderate LAE, no LAA clot, mild RAE;  c.  Echocardiogram (02/2013): EF 60-65%, Gr 2 DD, mildly dilated Ao root (Ao root dimension 39 mm), MAC, mild LAE, normal RVF  . Diabetes mellitus, type 2 ([place])   . Hx of cardiovascular stress test    Lexiscan Myoview (02/2013): No ischemia or scar, EF 51%, low risk  . Morbid obesity ([place])   . Sleep apnea    a. sleep test 01/2013:  very severy OSA    Current Outpatient Prescriptions  Medication Sig Dispense Refill  . amiodarone (PACERONE) 200 MG tablet Take 200 mg by mouth 2 (two) times daily.    Marland Kitchen  apixaban (ELIQUIS) 5 MG TABS tablet Take 1 tablet (5 mg total) by mouth 2 (two) times daily. 180 tablet 3  . atorvastatin (LIPITOR) 80 MG tablet Take 1 tablet (80 mg total) by mouth at bedtime. 30 tablet 2  . digoxin (LANOXIN) 0.125 MG tablet Take 1 tablet (0.125 mg total) by mouth daily. 30 tablet 4  . furosemide (LASIX) 40 MG tablet Take 1 tablet (40 mg total) by mouth daily. 30 tablet 4  . metoprolol succinate (TOPROL-XL) 50 MG 24 hr tablet Take 1 tablet (50 mg total) by mouth 2 (two) times daily. Take with or immediately following a meal. 60 tablet 4  . sacubitril-valsartan (ENTRESTO) 49-51 MG Take 1 tablet by mouth 2 (two) times daily. 60 tablet 11  . spironolactone (ALDACTONE) 25 MG tablet Take 1 tablet (25 mg total) by mouth daily. 30 tablet 2  . Krill Oil (OMEGA-3) 500 MG CAPS Take 1 capsule by mouth daily.    . metFORMIN (GLUCOPHAGE) 1000 MG tablet take 1 tablet by mouth twice a day with food (Patient not taking: Reported on 10/03/2016) 60 tablet 3   No current facility-administered medications for this encounter.     Vitals:   10/06/16 0936  BP: (!) 142/64  Pulse: 62  SpO2: 95%  Weight: 297 lb (134.7 kg)   Wt Readings from Last 3 Encounters:  10/06/16 297 lb (134.7 kg)  10/03/16 291 lb (132 kg)  10/02/16 283 lb (128.4 kg)    PHYSICAL EXAM: General:  Well appearing. No resp difficulty HEENT: normal Neck: supple. no JVD. Carotids 2+ bilat; no bruits. No lymphadenopathy or thryomegaly appreciated. Cor: PMI nondisplaced. Regular rate & rhythm. No rubs, gallops or murmurs. Lungs: clear Abdomen: obese, soft, nontender, nondistended. No hepatosplenomegaly. No bruits or masses. Good bowel sounds. Extremities: no cyanosis, clubbing, rash, edema Neuro: alert & orientedx3, cranial nerves grossly intact. moves all 4 extremities w/o difficulty. Affect pleasant  EKG: NSR 60 bpm personally reviewed.   ASSESSMENT & PLAN:  1. Chronic systolic CHF: Had [facility] 03/5954 cors ok. NICM,  possibly tachycardia-mediated in the setting of rapid atrial fibrillation.   Echo 04/2016 EF 15% but improved to 40-45% on TEE - NYHA class II -Continue Entresto 49/51 mg BID.    - Stop dig with EF 40-45%.  - Continue spironolactone 12.5 mg daily.  - Continue metoprolol 45m XL BID - Volume status stable. Continue Lasix 40 mg daily.  2. Atrial fibrillation: Persistent afib. CHA2DS2-VASc Score 3 and unadjusted Ischemic Stroke Rate (% 5.9.per year) .  Maintaining NSR.   Cut back amio to 200 mg daily.   LFTs and TSH OK in May 2018.  He will need regular eye exam.  - Continue Eliquis for anticoagulation. No bleeding problems.  - S/P TEE DCCV on 09/08/16. Maintaining NSR . Marland Kitchen  3. HLD:  - Continue statin.  4. OSA:  Continue CPAP nightly.   5. Obesity:  -discussed portion control.   Greater than 50% of the (total minutes 25) visit spent in counseling/coordination of care regarding heart failure and A fib.   Follow up 3 months.  Darrick Grinder, NP  10/06/2016

## 2016-10-06 NOTE — Patient Instructions (Signed)
STOP Digoxin  DECREASE Amiodarone to 200 mg (1 Tablet) Once Daily.   Follow up in 2-3 Months

## 2016-10-09 ENCOUNTER — Other Ambulatory Visit (HOSPITAL_COMMUNITY): Payer: Self-pay

## 2016-10-09 ENCOUNTER — Other Ambulatory Visit (HOSPITAL_COMMUNITY): Payer: Self-pay | Admitting: Student

## 2016-10-09 NOTE — Progress Notes (Signed)
Paramedicine Encounter    Patient ID: Darius [name], male    DOB: Jun 11, 1955, 61 y.o.   MRN: [id number]   Patient Care Team: [name], DO as PCP - General (Family Medicine) [name], MD as Consulting Physician (Cardiology)  Patient Active Problem List   Diagnosis Date Noted  . Atrial fibrillation with RVR ([place]) 04/17/2016  . Elevated troponin 04/17/2016  . CHF (congestive heart failure) ([place]) 04/17/2016  . Septic olecranon bursitis of right elbow 09/19/2015  . Lactic acid acidosis   . Dilated cardiomyopathy ([place]) 04/19/2014  . Type 2 diabetes mellitus ([place]) 04/09/2014  . Non compliance with medical treatment and diet 04/09/2014  . Chronic anticoagulation-Eliquis started 04/06/14 04/09/2014  . Abnormal chest CT-LUL nodule- needs f/u May 2016 04/09/2014  . Acute on chronic combined systolic and diastolic heart failure ([place]) 04/05/2014  . OSA - C-pap 03/22/2013  . Persistent atrial fibrillation ([place]) 03/27/2011  . Obesity, Class III, BMI 40-49.9 (morbid obesity) ([place]) 03/27/2011  . URI, acute 03/27/2011    Current Outpatient Prescriptions:  .  amiodarone (PACERONE) 200 MG tablet, Take 1 tablet (200 mg total) by mouth daily., Disp: 30 tablet, Rfl: 3 .  apixaban (ELIQUIS) 5 MG TABS tablet, Take 1 tablet (5 mg total) by mouth 2 (two) times daily., Disp: 180 tablet, Rfl: 3 .  atorvastatin (LIPITOR) 80 MG tablet, Take 1 tablet (80 mg total) by mouth at bedtime., Disp: 30 tablet, Rfl: 2 .  furosemide (LASIX) 40 MG tablet, Take 1 tablet (40 mg total) by mouth daily., Disp: 30 tablet, Rfl: 4 .  Krill Oil (OMEGA-3) 500 MG CAPS, Take 1 capsule by mouth daily., Disp: , Rfl:  .  metFORMIN (GLUCOPHAGE) 1000 MG tablet, take 1 tablet by mouth twice a day with food (Patient not taking: Reported on 10/03/2016), Disp: 60 tablet, Rfl: 3 .  metoprolol succinate (TOPROL-XL) 50 MG 24 hr tablet, Take 1 tablet (50 mg total) by mouth 2 (two) times daily. Take with or immediately following a meal.,  Disp: 60 tablet, Rfl: 4 .  sacubitril-valsartan (ENTRESTO) 49-51 MG, Take 1 tablet by mouth 2 (two) times daily., Disp: 60 tablet, Rfl: 11 .  spironolactone (ALDACTONE) 25 MG tablet, Take 1 tablet (25 mg total) by mouth daily., Disp: 30 tablet, Rfl: 2 No Known Allergies   Social History   Social History  . Marital status: Single    Spouse name: N/A  . Number of children: N/A  . Years of education: N/A   Occupational History  . unemployeed    Social History Main Topics  . Smoking status: Never Smoker  . Smokeless tobacco: Never Used  . Alcohol use Yes     Comment: some  . Drug use: No  . Sexual activity: No   Other Topics Concern  . Not on file   Social History Narrative   Admits that he eats poorly and eats a lot at work.      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    Physical Exam  Constitutional: He is oriented to person, place, and time.  Cardiovascular: Normal rate and regular rhythm.   Pulmonary/Chest: Effort normal and breath sounds normal. No respiratory distress. He has no wheezes. He has no rales.  Abdominal: Soft. He exhibits no distension.  Musculoskeletal: Normal range of motion. He exhibits no edema.  Neurological: He is alert and oriented to person, place, and time.  Skin: Skin is warm and dry.  Psychiatric: He has a normal mood  and affect.        Future Appointments Date Time Provider [place]  01/06/2017 11:30 AM [facility] PA/NP [facility] None   BP 126/72 (BP Location: Left Arm, Patient Position: Sitting, Cuff Size: Normal)   Pulse 68   Resp 16   Wt 295 lb (133.8 kg)   SpO2 96%   BMI 37.88 kg/m  Weight yesterday- 294 lbs Last visit weight- 291 lbs  Mr. Dhondt [facility] and reports feeling well. He denied SOB, dizziness, headache and orthopnea. He reports being compliant with his medications, which were verified and his pilbox was refilled. He was placed on the ReDS clip for training purposes  and received an acceptable reading. Medications were ordered and he advised he would pick them up.   ReDS clip reading: 28%  Time spent with patient: 34 minutes  Jacquiline Doe, EMT 10/09/16  ACTION: Home visit completed Next visit planned for 1 week

## 2016-10-10 MED FILL — METOPROLOL SUCC ER 50 MG TA: 50 | 34 days supply | Qty: 68 | Fill #0

## 2016-10-16 ENCOUNTER — Other Ambulatory Visit (HOSPITAL_COMMUNITY): Payer: Self-pay

## 2016-10-16 NOTE — Progress Notes (Signed)
Paramedicine Encounter    Patient ID: Darius [name], male    DOB: October 30, 1955, 61 y.o.   MRN: [id number]   Patient Care Team: Darius Kern, DO as PCP - General (Family Medicine) Darius Dresser, MD as Consulting Physician (Cardiology)  Patient Active Problem List   Diagnosis Date Noted  . Atrial fibrillation with RVR ([place]) 04/17/2016  . Elevated troponin 04/17/2016  . CHF (congestive heart failure) ([place]) 04/17/2016  . Septic olecranon bursitis of right elbow 09/19/2015  . Lactic acid acidosis   . Dilated cardiomyopathy ([place]) 04/19/2014  . Type 2 diabetes mellitus ([place]) 04/09/2014  . Non compliance with medical treatment and diet 04/09/2014  . Chronic anticoagulation-Eliquis started 04/06/14 04/09/2014  . Abnormal chest CT-LUL nodule- needs f/u May 2016 04/09/2014  . Acute on chronic combined systolic and diastolic heart failure ([place]) 04/05/2014  . OSA - C-pap 03/22/2013  . Persistent atrial fibrillation ([place]) 03/27/2011  . Obesity, Class III, BMI 40-49.9 (morbid obesity) ([place]) 03/27/2011  . URI, acute 03/27/2011    Current Outpatient Prescriptions:  .  amiodarone (PACERONE) 200 MG tablet, Take 1 tablet (200 mg total) by mouth daily., Disp: 30 tablet, Rfl: 3 .  apixaban (ELIQUIS) 5 MG TABS tablet, Take 1 tablet (5 mg total) by mouth 2 (two) times daily., Disp: 180 tablet, Rfl: 3 .  atorvastatin (LIPITOR) 80 MG tablet, Take 1 tablet (80 mg total) by mouth at bedtime., Disp: 30 tablet, Rfl: 2 .  furosemide (LASIX) 40 MG tablet, Take 1 tablet (40 mg total) by mouth daily., Disp: 30 tablet, Rfl: 4 .  metoprolol succinate (TOPROL-XL) 50 MG 24 hr tablet, TAKE 1 TABLET BY MOUTH TWICE DAILY, Disp: 68 tablet, Rfl: 0 .  sacubitril-valsartan (ENTRESTO) 49-51 MG, Take 1 tablet by mouth 2 (two) times daily., Disp: 60 tablet, Rfl: 11 .  spironolactone (ALDACTONE) 25 MG tablet, Take 1 tablet (25 mg total) by mouth daily., Disp: 30 tablet, Rfl: 2 .  Krill Oil (OMEGA-3) 500 MG CAPS, Take 1 capsule  by mouth daily., Disp: , Rfl:  .  metFORMIN (GLUCOPHAGE) 1000 MG tablet, take 1 tablet by mouth twice a day with food (Patient not taking: Reported on 10/03/2016), Disp: 60 tablet, Rfl: 3 No Known Allergies   Social History   Social History  . Marital status: Single    Spouse name: N/A  . Number of children: N/A  . Years of education: N/A   Occupational History  . unemployeed    Social History Main Topics  . Smoking status: Never Smoker  . Smokeless tobacco: Never Used  . Alcohol use Yes     Comment: some  . Drug use: No  . Sexual activity: No   Other Topics Concern  . Not on file   Social History Narrative   Admits that he eats poorly and eats a lot at work.      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    Physical Exam  Constitutional: He is oriented to person, place, and time.  Cardiovascular: Normal rate and regular rhythm.   Pulmonary/Chest: Effort normal and breath sounds normal. No respiratory distress. He has no wheezes. He has no rales.  Abdominal: Soft.  Musculoskeletal: Normal range of motion. He exhibits edema.  Neurological: He is alert and oriented to person, place, and time.  Skin: Skin is warm and dry.  Psychiatric: He has a normal mood and affect.        Future Appointments Date Time Provider [place]  01/06/2017 11:30 AM [facility] PA/NP [facility] None   BP 110/76 (BP Location: Right Arm, Patient Position: Sitting, Cuff Size: Normal)   Pulse 62   Resp 16   Wt 291 lb 9.6 oz (132.3 kg)   SpO2 96%   BMI 37.44 kg/m  Weight yesterday- 292 lbs Last visit weight- 295 lbs CBG- 137 mg/dl  [name] [facility] He reports having a cold but denied SOB, headache or dizziness. He reports being compliant with his medications which were verified and his pillbox was refilled.   Time spent with patient: 25 minutes   Jacquiline Doe, EMT 10/16/16  ACTION: Home visit completed Next visit planned for 1  week

## 2016-10-22 ENCOUNTER — Other Ambulatory Visit (HOSPITAL_COMMUNITY): Payer: Self-pay

## 2016-10-22 NOTE — Progress Notes (Signed)
Paramedicine Encounter    Patient ID: Darius [name], male    DOB: 1955/11/09, 61 y.o.   MRN: [id number]   Patient Care Team: Darius Kern, DO as PCP - General (Family Medicine) Darius Dresser, MD as Consulting Physician (Cardiology)  Patient Active Problem List   Diagnosis Date Noted  . Atrial fibrillation with RVR ([place]) 04/17/2016  . Elevated troponin 04/17/2016  . CHF (congestive heart failure) ([place]) 04/17/2016  . Septic olecranon bursitis of right elbow 09/19/2015  . Lactic acid acidosis   . Dilated cardiomyopathy ([place]) 04/19/2014  . Type 2 diabetes mellitus ([place]) 04/09/2014  . Non compliance with medical treatment and diet 04/09/2014  . Chronic anticoagulation-Eliquis started 04/06/14 04/09/2014  . Abnormal chest CT-LUL nodule- needs f/u May 2016 04/09/2014  . Acute on chronic combined systolic and diastolic heart failure ([place]) 04/05/2014  . OSA - C-pap 03/22/2013  . Persistent atrial fibrillation ([place]) 03/27/2011  . Obesity, Class III, BMI 40-49.9 (morbid obesity) ([place]) 03/27/2011  . URI, acute 03/27/2011    Current Outpatient Prescriptions:  .  amiodarone (PACERONE) 200 MG tablet, Take 1 tablet (200 mg total) by mouth daily., Disp: 30 tablet, Rfl: 3 .  apixaban (ELIQUIS) 5 MG TABS tablet, Take 1 tablet (5 mg total) by mouth 2 (two) times daily., Disp: 180 tablet, Rfl: 3 .  atorvastatin (LIPITOR) 80 MG tablet, Take 1 tablet (80 mg total) by mouth at bedtime., Disp: 30 tablet, Rfl: 2 .  furosemide (LASIX) 40 MG tablet, Take 1 tablet (40 mg total) by mouth daily., Disp: 30 tablet, Rfl: 4 .  metoprolol succinate (TOPROL-XL) 50 MG 24 hr tablet, TAKE 1 TABLET BY MOUTH TWICE DAILY, Disp: 68 tablet, Rfl: 0 .  sacubitril-valsartan (ENTRESTO) 49-51 MG, Take 1 tablet by mouth 2 (two) times daily., Disp: 60 tablet, Rfl: 11 .  spironolactone (ALDACTONE) 25 MG tablet, Take 1 tablet (25 mg total) by mouth daily., Disp: 30 tablet, Rfl: 2 .  Krill Oil (OMEGA-3) 500 MG CAPS, Take 1 capsule  by mouth daily., Disp: , Rfl:  .  metFORMIN (GLUCOPHAGE) 1000 MG tablet, take 1 tablet by mouth twice a day with food (Patient not taking: Reported on 10/03/2016), Disp: 60 tablet, Rfl: 3 No Known Allergies   Social History   Social History  . Marital status: Single    Spouse name: N/A  . Number of children: N/A  . Years of education: N/A   Occupational History  . unemployeed    Social History Main Topics  . Smoking status: Never Smoker  . Smokeless tobacco: Never Used  . Alcohol use Yes     Comment: some  . Drug use: No  . Sexual activity: No   Other Topics Concern  . Not on file   Social History Narrative   Admits that he eats poorly and eats a lot at work.      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    Physical Exam  Constitutional: He is oriented to person, place, and time.  Cardiovascular: Normal rate and regular rhythm.   Pulmonary/Chest: Effort normal and breath sounds normal. No respiratory distress. He has no wheezes. He has no rales.  Abdominal: Soft.  Musculoskeletal: Normal range of motion. He exhibits edema.  Neurological: He is alert and oriented to person, place, and time.  Skin: Skin is warm and dry.  Psychiatric: He has a normal mood and affect.        Future Appointments Date Time Provider [place]  01/06/2017 11:30 AM [facility] PA/NP [facility] None   BP 104/78 (BP Location: Right Arm, Patient Position: Sitting, Cuff Size: Normal)   Pulse 68   Resp 16   Wt 297 lb (134.7 kg)   SpO2 97%   BMI 38.13 kg/m  Weight yesterday- 295 lbs Last visit weight- 291 lbs  D[name] and reports feeling generally well. He denied SOB, dizziness, headache or orthopnea. His medications were verified and his pillbox was refilled. Still need to find PCP   Time spent with patient: 30 minutes   Darius [name], EMT 10/22/16  ACTION: Home visit completed Next visit planned for 1 week

## 2016-10-30 ENCOUNTER — Other Ambulatory Visit (HOSPITAL_COMMUNITY): Payer: Self-pay

## 2016-10-30 ENCOUNTER — Telehealth: Payer: Self-pay | Admitting: Family Medicine

## 2016-10-30 MED FILL — FUROSEMIDE 40 MG TABLET: 40 | 34 days supply | Qty: 34 | Fill #3

## 2016-10-30 MED FILL — AMIODARONE HCL 200 MG TAB: 200 | 34 days supply | Qty: 34 | Fill #2

## 2016-10-30 MED FILL — ATORVASTATIN 80 MG TABLET: 80 | 30 days supply | Qty: 30 | Fill #0

## 2016-10-30 MED FILL — SPIRONOLACTONE 25 MG TABLET: 25 | 30 days supply | Qty: 30 | Fill #0

## 2016-10-30 NOTE — Telephone Encounter (Signed)
[name] with [facility] called to see if pt was still active at our practice. Pt not seen since 07/2013 so would be considered new. Nothing further needed.

## 2016-10-30 NOTE — Progress Notes (Signed)
Paramedicine Encounter    Patient ID: Jaylen Knope, male    DOB: 1955/07/11, 61 y.o.   MRN: [id number]   Patient Care Team: [name], DO as PCP - General (Family Medicine) [name], MD as Consulting Physician (Cardiology)  Patient Active Problem List   Diagnosis Date Noted  . Atrial fibrillation with RVR ([place]) 04/17/2016  . Elevated troponin 04/17/2016  . CHF (congestive heart failure) ([place]) 04/17/2016  . Septic olecranon bursitis of right elbow 09/19/2015  . Lactic acid acidosis   . Dilated cardiomyopathy ([place]) 04/19/2014  . Type 2 diabetes mellitus ([place]) 04/09/2014  . Non compliance with medical treatment and diet 04/09/2014  . Chronic anticoagulation-Eliquis started 04/06/14 04/09/2014  . Abnormal chest CT-LUL nodule- needs f/u May 2016 04/09/2014  . Acute on chronic combined systolic and diastolic heart failure ([place]) 04/05/2014  . OSA - C-pap 03/22/2013  . Persistent atrial fibrillation ([place]) 03/27/2011  . Obesity, Class III, BMI 40-49.9 (morbid obesity) ([place]) 03/27/2011  . URI, acute 03/27/2011    Current Outpatient Prescriptions:  .  amiodarone (PACERONE) 200 MG tablet, Take 1 tablet (200 mg total) by mouth daily., Disp: 30 tablet, Rfl: 3 .  apixaban (ELIQUIS) 5 MG TABS tablet, Take 1 tablet (5 mg total) by mouth 2 (two) times daily., Disp: 180 tablet, Rfl: 3 .  atorvastatin (LIPITOR) 80 MG tablet, Take 1 tablet (80 mg total) by mouth at bedtime., Disp: 30 tablet, Rfl: 2 .  furosemide (LASIX) 40 MG tablet, Take 1 tablet (40 mg total) by mouth daily., Disp: 30 tablet, Rfl: 4 .  metoprolol succinate (TOPROL-XL) 50 MG 24 hr tablet, TAKE 1 TABLET BY MOUTH TWICE DAILY, Disp: 68 tablet, Rfl: 0 .  sacubitril-valsartan (ENTRESTO) 49-51 MG, Take 1 tablet by mouth 2 (two) times daily., Disp: 60 tablet, Rfl: 11 .  spironolactone (ALDACTONE) 25 MG tablet, Take 1 tablet (25 mg total) by mouth daily., Disp: 30 tablet, Rfl: 2 .  Krill Oil (OMEGA-3) 500 MG CAPS, Take 1 capsule  by mouth daily., Disp: , Rfl:  .  metFORMIN (GLUCOPHAGE) 1000 MG tablet, take 1 tablet by mouth twice a day with food (Patient not taking: Reported on 10/03/2016), Disp: 60 tablet, Rfl: 3 No Known Allergies   Social History   Social History  . Marital status: Single    Spouse name: N/A  . Number of children: N/A  . Years of education: N/A   Occupational History  . unemployeed    Social History Main Topics  . Smoking status: Never Smoker  . Smokeless tobacco: Never Used  . Alcohol use Yes     Comment: some  . Drug use: No  . Sexual activity: No   Other Topics Concern  . Not on file   Social History Narrative   Admits that he eats poorly and eats a lot at work.      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    Physical Exam  Constitutional: He is oriented to person, place, and time.  Cardiovascular: Normal rate and regular rhythm.   Pulmonary/Chest: Effort normal and breath sounds normal. No respiratory distress. He has no wheezes. He has no rales.  Abdominal: Soft.  Musculoskeletal: Normal range of motion. He exhibits no edema.  Neurological: He is alert and oriented to person, place, and time.  Skin: Skin is warm and dry.  Psychiatric: He has a normal mood and affect.        Future Appointments Date Time Provider [place]  01/06/2017 11:30 AM [facility] PA/NP [facility] None   BP 100/72 (BP Location: Right Arm, Patient Position: Sitting, Cuff Size: Normal)   Pulse 62   Resp 16   Wt 291 lb (132 kg)   SpO2 96%   BMI 37.36 kg/m  Weight yesterday- 286 lbs Last visit weight-297 lbs  Mr. Mackowski [facility] and reports feeling well. He denied SOB, dizziness, headache or orthopnea. He reports being compliant with all his medication and even correctly filled his pillbox prior to seeing me. He reports having a weight drop over the past week from 297 lbs to 286 lbs yesterday however he regained 5 lbs overnight. He does not  exhibit any dependent edema or respiratory distress. He stated he lost the weight due to running out of money and not being able to eat fast food and snacks at home. Today's visit was extended because we were attempting to find a primary care physician so his diabetes could be managed. [facility] was contacted and referred [place] to [facility] because it was the last place he had an appointment scheduled. [facility] advised that they accept patient's without insurance on a self-pay basis only and referred [place] to [facility]. [facility] stated they did not have any "new patient" appointments and we would have to contact them next month. The gave [place] information to [facility] who also accepts patients without insurance and are on a limited income. Primary Care Services has scheduled him to come in on October 3 at 10:30 am.   Time spent with patient: 109 minutes  Jacquiline Doe, EMT 10/30/16  ACTION: Home visit completed Next visit planned for 1 week

## 2016-11-04 ENCOUNTER — Encounter (HOSPITAL_COMMUNITY): Payer: Self-pay

## 2016-11-04 NOTE — Progress Notes (Signed)
[facility] faxed request for medical records for time period 02/2015-present. First consult from CHF office 04/2016. All available records from CHF clinic office/providers mailed to provided mailing address: [id number] [place]   [id number]  Renee Pain, RN

## 2016-11-06 ENCOUNTER — Other Ambulatory Visit (HOSPITAL_COMMUNITY): Payer: Self-pay

## 2016-11-06 NOTE — Progress Notes (Signed)
Paramedicine Encounter    Patient ID: Darius [name], male    DOB: [birth year], 61 y.o.   MRN: [id number]   Patient Care Team: [name], MD as Consulting Physician (Cardiology)  Patient Active Problem List   Diagnosis Date Noted  . Atrial fibrillation with RVR ([place]) 04/17/2016  . Elevated troponin 04/17/2016  . CHF (congestive heart failure) ([place]) 04/17/2016  . Septic olecranon bursitis of right elbow 09/19/2015  . Lactic acid acidosis   . Dilated cardiomyopathy ([place]) 04/19/2014  . Type 2 diabetes mellitus ([place]) 04/09/2014  . Non compliance with medical treatment and diet 04/09/2014  . Chronic anticoagulation-Eliquis started 04/06/14 04/09/2014  . Abnormal chest CT-LUL nodule- needs f/u May 2016 04/09/2014  . Acute on chronic combined systolic and diastolic heart failure ([place]) 04/05/2014  . OSA - C-pap 03/22/2013  . Persistent atrial fibrillation ([place]) 03/27/2011  . Obesity, Class III, BMI 40-49.9 (morbid obesity) ([place]) 03/27/2011  . URI, acute 03/27/2011    Current Outpatient Prescriptions:  .  amiodarone (PACERONE) 200 MG tablet, Take 1 tablet (200 mg total) by mouth daily., Disp: 30 tablet, Rfl: 3 .  apixaban (ELIQUIS) 5 MG TABS tablet, Take 1 tablet (5 mg total) by mouth 2 (two) times daily., Disp: 180 tablet, Rfl: 3 .  atorvastatin (LIPITOR) 80 MG tablet, Take 1 tablet (80 mg total) by mouth at bedtime., Disp: 30 tablet, Rfl: 2 .  furosemide (LASIX) 40 MG tablet, Take 1 tablet (40 mg total) by mouth daily., Disp: 30 tablet, Rfl: 4 .  metoprolol succinate (TOPROL-XL) 50 MG 24 hr tablet, TAKE 1 TABLET BY MOUTH TWICE DAILY, Disp: 68 tablet, Rfl: 0 .  sacubitril-valsartan (ENTRESTO) 49-51 MG, Take 1 tablet by mouth 2 (two) times daily., Disp: 60 tablet, Rfl: 11 .  spironolactone (ALDACTONE) 25 MG tablet, Take 1 tablet (25 mg total) by mouth daily., Disp: 30 tablet, Rfl: 2 .  Krill Oil (OMEGA-3) 500 MG CAPS, Take 1 capsule by mouth daily., Disp: , Rfl:  .  metFORMIN  (GLUCOPHAGE) 1000 MG tablet, take 1 tablet by mouth twice a day with food (Patient not taking: Reported on 10/03/2016), Disp: 60 tablet, Rfl: 3 No Known Allergies   Social History   Social History  . Marital status: Single    Spouse name: N/A  . Number of children: N/A  . Years of education: N/A   Occupational History  . unemployeed    Social History Main Topics  . Smoking status: Never Smoker  . Smokeless tobacco: Never Used  . Alcohol use Yes     Comment: some  . Drug use: No  . Sexual activity: No   Other Topics Concern  . Not on file   Social History Narrative   Admits that he eats poorly and eats a lot at work.      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    Physical Exam  Constitutional: He is oriented to person, place, and time.  Cardiovascular: Normal rate and regular rhythm.   Pulmonary/Chest: Effort normal and breath sounds normal. No respiratory distress. He has no wheezes. He has no rales.  Abdominal: Soft.  Musculoskeletal: Normal range of motion. He exhibits no edema.  Neurological: He is alert and oriented to person, place, and time.  Skin: Skin is warm and dry.  Psychiatric: He has a normal mood and affect.        Future Appointments Date Time Provider [place]  11/12/2016 10:30 AM Scot Jun, FNP [facility] None  01/06/2017 11:30 AM [facility] PA/NP [facility] None   BP 118/76 (BP Location: Left Arm, Patient Position: Sitting, Cuff Size: Normal)   Pulse 76   Resp 16   Wt 291 lb (132 kg)   SpO2 95%   BMI 37.36 kg/m  Weight yesterday- 291 lbs Last visit weight- 291 lbs  Darius [name] [facility] today and reports feeling  well. He denied SOB, headache, dizziness or orthopnea. His medications were verified and his pillbox was refilled. He was out of [place] and had not been to the location where it is normally delivered to see if it was there. I requested he call the location but he did not have the number.  He stated the shop was close to where we were located so I requested he go see if the medicine was there. When he returned, he stated the business said they only keep packages for 7 days and then retune it to [place]. I contacted the pharmacy to have them reship the medication and also got samples from the clinic to give him until the other medicine is delivered.   Time spent with patient: 51 minutes  Jacquiline Doe, EMT 11/06/16  ACTION: Home visit completed Next visit planned for 1 week

## 2016-11-12 ENCOUNTER — Other Ambulatory Visit (HOSPITAL_COMMUNITY): Payer: Self-pay

## 2016-11-12 ENCOUNTER — Ambulatory Visit (INDEPENDENT_AMBULATORY_CARE_PROVIDER_SITE_OTHER): Payer: Self-pay | Admitting: Family Medicine

## 2016-11-12 ENCOUNTER — Encounter: Payer: Self-pay | Admitting: Family Medicine

## 2016-11-12 ENCOUNTER — Other Ambulatory Visit (HOSPITAL_COMMUNITY): Payer: Self-pay | Admitting: Internal Medicine

## 2016-11-12 VITALS — BP 106/66 | HR 67 | Temp 98.0°F | Resp 16 | Ht 74.0 in | Wt 300.0 lb

## 2016-11-12 DIAGNOSIS — Z9989 Dependence on other enabling machines and devices: Secondary | ICD-10-CM

## 2016-11-12 DIAGNOSIS — G4733 Obstructive sleep apnea (adult) (pediatric): Secondary | ICD-10-CM

## 2016-11-12 DIAGNOSIS — E785 Hyperlipidemia, unspecified: Secondary | ICD-10-CM

## 2016-11-12 DIAGNOSIS — R82998 Other abnormal findings in urine: Secondary | ICD-10-CM

## 2016-11-12 DIAGNOSIS — R7989 Other specified abnormal findings of blood chemistry: Secondary | ICD-10-CM

## 2016-11-12 DIAGNOSIS — I10 Essential (primary) hypertension: Secondary | ICD-10-CM

## 2016-11-12 DIAGNOSIS — Z7901 Long term (current) use of anticoagulants: Secondary | ICD-10-CM

## 2016-11-12 DIAGNOSIS — Z23 Encounter for immunization: Secondary | ICD-10-CM

## 2016-11-12 DIAGNOSIS — E119 Type 2 diabetes mellitus without complications: Secondary | ICD-10-CM

## 2016-11-12 DIAGNOSIS — I4891 Unspecified atrial fibrillation: Secondary | ICD-10-CM

## 2016-11-12 DIAGNOSIS — Z1159 Encounter for screening for other viral diseases: Secondary | ICD-10-CM

## 2016-11-12 LAB — POCT URINALYSIS DIP (DEVICE)
BILIRUBIN URINE: NEGATIVE
Glucose, UA: NEGATIVE mg/dL
Ketones, ur: NEGATIVE mg/dL
NITRITE: NEGATIVE
Protein, ur: NEGATIVE mg/dL
Specific Gravity, Urine: 1.025 (ref 1.005–1.030)
UROBILINOGEN UA: 2 mg/dL — AB (ref 0.0–1.0)
pH: 5.5 (ref 5.0–8.0)

## 2016-11-12 LAB — POCT GLYCOSYLATED HEMOGLOBIN (HGB A1C): Hemoglobin A1C: 7.5

## 2016-11-12 MED ORDER — METFORMIN HCL 1000 MG PO TABS: ORAL_TABLET | ORAL | 3 refills | Status: DC

## 2016-11-12 MED FILL — metFORMIN HCL 1000 MG TABS: 1000 | 30 days supply | Qty: 60 | Fill #0

## 2016-11-12 NOTE — Progress Notes (Signed)
Paramedicine Encounter    Patient ID: Darius [name], male    DOB: [birth year], 61 y.o.   MRN: [id number]   Patient Care Team: Scot Jun, FNP as PCP - General (Family Medicine) [name], MD as Consulting Physician (Cardiology)  Patient Active Problem List   Diagnosis Date Noted  . Atrial fibrillation with RVR ([place]) 04/17/2016  . Elevated troponin 04/17/2016  . CHF (congestive heart failure) ([place]) 04/17/2016  . Septic olecranon bursitis of right elbow 09/19/2015  . Lactic acid acidosis   . Dilated cardiomyopathy ([place]) 04/19/2014  . Type 2 diabetes mellitus ([place]) 04/09/2014  . Non compliance with medical treatment and diet 04/09/2014  . Chronic anticoagulation-Eliquis started 04/06/14 04/09/2014  . Abnormal chest CT-LUL nodule- needs f/u May 2016 04/09/2014  . Acute on chronic combined systolic and diastolic heart failure ([place]) 04/05/2014  . OSA - C-pap 03/22/2013  . Persistent atrial fibrillation ([place]) 03/27/2011  . Obesity, Class III, BMI 40-49.9 (morbid obesity) ([place]) 03/27/2011  . URI, acute 03/27/2011    Current Outpatient Prescriptions:  .  amiodarone (PACERONE) 200 MG tablet, Take 1 tablet (200 mg total) by mouth daily., Disp: 30 tablet, Rfl: 3 .  apixaban (ELIQUIS) 5 MG TABS tablet, Take 1 tablet (5 mg total) by mouth 2 (two) times daily., Disp: 180 tablet, Rfl: 3 .  atorvastatin (LIPITOR) 80 MG tablet, Take 1 tablet (80 mg total) by mouth at bedtime., Disp: 30 tablet, Rfl: 2 .  furosemide (LASIX) 40 MG tablet, Take 1 tablet (40 mg total) by mouth daily., Disp: 30 tablet, Rfl: 4 .  metFORMIN (GLUCOPHAGE) 1000 MG tablet, take 1 tablet by mouth twice a day with food, Disp: 60 tablet, Rfl: 3 .  metoprolol succinate (TOPROL-XL) 50 MG 24 hr tablet, TAKE 1 TABLET BY MOUTH TWICE DAILY, Disp: 68 tablet, Rfl: 0 .  sacubitril-valsartan (ENTRESTO) 49-51 MG, Take 1 tablet by mouth 2 (two) times daily., Disp: 60 tablet, Rfl: 11 .  spironolactone (ALDACTONE) 25 MG tablet,  Take 1 tablet (25 mg total) by mouth daily., Disp: 30 tablet, Rfl: 2 .  Krill Oil (OMEGA-3) 500 MG CAPS, Take 1 capsule by mouth daily., Disp: , Rfl:  No Known Allergies   Social History   Social History  . Marital status: Single    Spouse name: N/A  . Number of children: N/A  . Years of education: N/A   Occupational History  . unemployeed    Social History Main Topics  . Smoking status: Never Smoker  . Smokeless tobacco: Never Used  . Alcohol use Yes     Comment: some  . Drug use: No  . Sexual activity: No   Other Topics Concern  . Not on file   Social History Narrative   Admits that he eats poorly and eats a lot at work.      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    Physical Exam  Constitutional: He is oriented to person, place, and time.  Cardiovascular: Normal rate and regular rhythm.   Pulmonary/Chest: Effort normal and breath sounds normal. No respiratory distress. He has no wheezes. He has no rales.  Abdominal: Soft.  Musculoskeletal: Normal range of motion. He exhibits edema.  Neurological: He is alert and oriented to person, place, and time.  Skin: Skin is warm and dry.  Psychiatric: He has a normal mood and affect.        Future Appointments Date Time Provider [place]  12/12/2016 9:30 AM [facility] LAB [facility]  None  01/06/2017 11:30 AM [facility] PA/NP [facility] None  02/13/2017 3:30 PM Scot Jun, FNP [facility] None   BP 130/90 (BP Location: Right Arm, Patient Position: Sitting, Cuff Size: Normal)   Pulse 90   Resp 16   Wt 297 lb (134.7 kg)   SpO2 93%   BMI 38.13 kg/m  Weight yesterday- 296 lb Last visit weight- 291 lb  [facility]. He stated he was feeling generally we and denied SOB, headache, dizziness or orthopnea. He reports being compliant with all his medications. His medications were verified and his pillbox was refilled. I also filled his pillbox with an extra week of medications  to cover while I am on vacation and let him know he could contact [place] or [place] via the clinic if he needs anything while I am gone.   Jacquiline Doe, EMT 11/12/16  ACTION: Home visit completed Next visit planned for 1 week

## 2016-11-12 NOTE — Progress Notes (Signed)
Patient ID: Darius [name], male    DOB: 1955/09/12, 61 y.o.   MRN: [id number]  PCP: Scot Jun, FNP  Chief Complaint  Patient presents with  . Establish Care    Subjective:  HPI Darius [name] is a 61 y.o. male presents to establish care. Medical problems include persistent atrial fibrillation, chronic heart failure, OSA, Type 2 diabetes, and obesity. Vu is followed by [facility] and is enrolled in the patient outreach program which involves a weekly visit and evaluation by a EMT. His cardiologist is Dr. Loralie Champagne. He admits to non-compliance with CPAP for over year as he reports discomfort and ill fitting of mask. Denies any recent worsening of shortness of breath or chest pain. Shortness of breath is often precipitated by exertional activity. He is prescribed chronic anticoagulation due to atrial fibrillation. He denies palpitations or dizziness. In March 2018 he underwent right and left heart catheterization with stent placement. Most recent echocardiogram 09/08/2016 indicated mild to moderate reduced systolic function, EF 83-41%. History of cardioversion due to Atria fibrillation with RVR. He remains in atrial fib although rate controlled and asymptomatic. Darius [name] suffers from type 2 diabetes. He was diagnosed in 2011 with type 2 diabetes. Last A1C 7.6, he is currently managed on metformin. He reports occasional neuropathy of his fingers and toes. Reports physical activity as tolerated although not routinely. Current Body mass index is 38.52 kg/m.  He reports no complaints today. Social History   Social History  . Marital status: Single    Spouse name: N/A  . Number of children: N/A  . Years of education: N/A   Occupational History  . unemployeed    Social History Main Topics  . Smoking status: Never Smoker  . Smokeless tobacco: Never Used  . Alcohol use Yes     Comment: some  . Drug use: No  . Sexual activity: No   Other Topics Concern  . Not on file    Social History Narrative   Admits that he eats poorly and eats a lot at work.      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    Family History  Problem Relation Age of Onset  . Diabetes Father        died in his 50s  . Hypertension Father        died in her 23s  . Seizures Mother   . Hypertension Sister   . Hypertension Paternal Grandfather   . Heart attack Neg Hx   . Stroke Neg Hx    Review of Systems See HPI  Patient Active Problem List   Diagnosis Date Noted  . Atrial fibrillation with RVR ([place]) 04/17/2016  . Elevated troponin 04/17/2016  . CHF (congestive heart failure) ([place]) 04/17/2016  . Septic olecranon bursitis of right elbow 09/19/2015  . Lactic acid acidosis   . Dilated cardiomyopathy ([place]) 04/19/2014  . Type 2 diabetes mellitus ([place]) 04/09/2014  . Non compliance with medical treatment and diet 04/09/2014  . Chronic anticoagulation-Eliquis started 04/06/14 04/09/2014  . Abnormal chest CT-LUL nodule- needs f/u May 2016 04/09/2014  . Acute on chronic combined systolic and diastolic heart failure ([place]) 04/05/2014  . OSA - C-pap 03/22/2013  . Persistent atrial fibrillation ([place]) 03/27/2011  . Obesity, Class III, BMI 40-49.9 (morbid obesity) ([place]) 03/27/2011  . URI, acute 03/27/2011    No Known Allergies  Prior to Admission medications   Medication Sig Start Date End Date Taking? Authorizing Provider  amiodarone (PACERONE) 200  MG tablet Take 1 tablet (200 mg total) by mouth daily. 10/06/16  Yes Clegg, Amy D, NP  apixaban (ELIQUIS) 5 MG TABS tablet Take 1 tablet (5 mg total) by mouth 2 (two) times daily. 05/08/16  Yes Clegg, Amy D, NP  atorvastatin (LIPITOR) 80 MG tablet Take 1 tablet (80 mg total) by mouth at bedtime. 08/01/16  Yes [name], MD  furosemide (LASIX) 40 MG tablet Take 1 tablet (40 mg total) by mouth daily. 04/25/16  Yes Rai, Vernelle Emerald, MD  Javier Docker Oil (OMEGA-3) 500 MG CAPS Take 1 capsule by mouth daily.   Yes [provider]  metFORMIN (GLUCOPHAGE) 1000 MG tablet take 1 tablet by mouth twice a day with food 10/04/15  Yes McClung, Angela M, PA-C  metoprolol succinate (TOPROL-XL) 50 MG 24 hr tablet TAKE 1 TABLET BY MOUTH TWICE DAILY 10/10/16  Yes Bensimhon, Shaune Pascal, MD  sacubitril-valsartan (ENTRESTO) 49-51 MG Take 1 tablet by mouth 2 (two) times daily. 09/19/16  Yes [name], MD  spironolactone (ALDACTONE) 25 MG tablet Take 1 tablet (25 mg total) by mouth daily. 08/01/16 11/06/16  [name], MD    Past Medical, Surgical Family and Social History reviewed and updated.    Objective:   Today's Vitals   11/12/16 1035  BP: 106/66  Pulse: 67  Resp: 16  Temp: 98 F (36.7 C)  TempSrc: Oral  SpO2: 96%  Weight: 300 lb (136.1 kg)  Height: 6\' 2"  (1.88 m)    Wt Readings from Last 3 Encounters:  11/12/16 300 lb (136.1 kg)  11/06/16 291 lb (132 kg)  10/30/16 291 lb (132 kg)   Physical Exam  Constitutional: He is oriented to person, place, and time. He appears well-developed and well-nourished.  HENT:  Head: Normocephalic and atraumatic.  Eyes: Pupils are equal, round, and reactive to light. Conjunctivae and EOM are normal.  Neck: Normal range of motion. Neck supple. No thyromegaly present.  Cardiovascular: An irregularly irregular rhythm present.  Pulses:      Carotid pulses are 2+ on the right side, and 2+ on the left side.      Radial pulses are 2+ on the right side, and 2+ on the left side.  Pulmonary/Chest: Effort normal and breath sounds normal.  Abdominal: Soft. Bowel sounds are normal. He exhibits no distension.  Lymphadenopathy:    He has no cervical adenopathy.  Neurological: He is alert and oriented to person, place, and time.  Skin: Skin is warm and dry.  Psychiatric: He has a normal mood and affect. His behavior is normal. Judgment and thought content normal.   Assessment & Plan:  1. Atrial fibrillation, unspecified type [facility]), continue follow-up with [facility]. Patient is currently non-compliant with CPAP therapy. Ordering a CPAP titration as setting likely need re-evaluation. Continue current regimen. 2. Essential hypertension, controlled. 3. Abnormal TSH, rechecking - Thyroid Panel With TSH 4. Need for hepatitis C screening test- Hepatitis C RNA quantitative 5. Anticoagulated,  CBC with Differential 6. Type 2 diabetes mellitus without complication, without long-term current use of insulin ([place]), Goal <7.0 - POCT glycosylated hemoglobin (Hb A1C), 7.5 today. Continue metformin, physical activity as tolerated,  - HM Diabetes Foot Exam - Microalbumin/Creatinine Ratio, Urine 8. Urine leukocytes- Urine Culture 9. Need for influenza vaccination- Flu Vaccine QUAD 36+ mos IM 10. OSA on CPAP, patient's last documented sleep study encounter 02/07/2013. He reports that he is not currently using CPAP machine. Ordering a CPAP titration.   Return  in 1 month fasting lipid panel. 3 months for routine chronic disease management.   Carroll Sage. Kenton Kingfisher, MSN, FNP-C [facility]  [address] [place], [place] [address] [phone]

## 2016-11-13 LAB — URINE CULTURE
MICRO NUMBER: 81098351
RESULT: NO GROWTH
SPECIMEN QUALITY: ADEQUATE

## 2016-11-13 LAB — MICROALBUMIN / CREATININE URINE RATIO
Creatinine, Urine: 193 mg/dL (ref 20–320)
MICROALB UR: 1.5 mg/dL
MICROALB/CREAT RATIO: 8 ug/mg{creat} (ref <30)

## 2016-11-15 LAB — CBC WITH DIFFERENTIAL/PLATELET
BASOS PCT: 0.5 %
Basophils Absolute: 47 cells/uL (ref 0–200)
Eosinophils Absolute: 660 cells/uL — ABNORMAL HIGH (ref 15–500)
Eosinophils Relative: 7.1 %
HEMATOCRIT: 41.9 % (ref 38.5–50.0)
HEMOGLOBIN: 14.4 g/dL (ref 13.2–17.1)
LYMPHS ABS: 2195 {cells}/uL (ref 850–3900)
MCH: 30.1 pg (ref 27.0–33.0)
MCHC: 34.4 g/dL (ref 32.0–36.0)
MCV: 87.7 fL (ref 80.0–100.0)
MPV: 10.2 fL (ref 7.5–12.5)
Monocytes Relative: 8.9 %
NEUTROS ABS: 5571 {cells}/uL (ref 1500–7800)
Neutrophils Relative %: 59.9 %
Platelets: 222 10*3/uL (ref 140–400)
RBC: 4.78 10*6/uL (ref 4.20–5.80)
RDW: 13.6 % (ref 11.0–15.0)
Total Lymphocyte: 23.6 %
WBC: 9.3 10*3/uL (ref 3.8–10.8)
WBCMIX: 828 {cells}/uL (ref 200–950)

## 2016-11-15 LAB — THYROID PANEL WITH TSH
Free Thyroxine Index: 2.9 (ref 1.4–3.8)
T3 Uptake: 32 % (ref 22–35)
T4, Total: 9.2 ug/dL (ref 4.9–10.5)
TSH: 1.03 mIU/L (ref 0.40–4.50)

## 2016-11-15 LAB — COMPLETE METABOLIC PANEL WITH GFR
AG RATIO: 1.4 (calc) (ref 1.0–2.5)
ALBUMIN MSPROF: 3.7 g/dL (ref 3.6–5.1)
ALT: 20 U/L (ref 9–46)
AST: 16 U/L (ref 10–35)
Alkaline phosphatase (APISO): 92 U/L (ref 40–115)
BUN: 17 mg/dL (ref 7–25)
CALCIUM: 9.1 mg/dL (ref 8.6–10.3)
CO2: 26 mmol/L (ref 20–32)
Chloride: 100 mmol/L (ref 98–110)
Creat: 1.05 mg/dL (ref 0.70–1.25)
GFR, EST AFRICAN AMERICAN: 88 mL/min/{1.73_m2} (ref >=60)
GFR, EST NON AFRICAN AMERICAN: 76 mL/min/{1.73_m2} (ref >=60)
GLOBULIN: 2.7 g/dL (ref 1.9–3.7)
Glucose, Bld: 194 mg/dL — ABNORMAL HIGH (ref 65–99)
POTASSIUM: 4.6 mmol/L (ref 3.5–5.3)
SODIUM: 136 mmol/L (ref 135–146)
TOTAL PROTEIN: 6.4 g/dL (ref 6.1–8.1)
Total Bilirubin: 0.5 mg/dL (ref 0.2–1.2)

## 2016-11-15 LAB — HEPATITIS C RNA QUANTITATIVE
HCV Quantitative Log: <1.18 Log IU/mL
HCV RNA, PCR, QN: <15 IU/mL

## 2016-11-17 MED FILL — METOPROLOL SUCC ER 50 MG TA: 50 | 34 days supply | Qty: 68 | Fill #0

## 2016-11-27 ENCOUNTER — Telehealth: Payer: Self-pay

## 2016-11-27 ENCOUNTER — Other Ambulatory Visit (HOSPITAL_COMMUNITY): Payer: Self-pay

## 2016-11-27 DIAGNOSIS — G4733 Obstructive sleep apnea (adult) (pediatric): Secondary | ICD-10-CM

## 2016-11-27 NOTE — Telephone Encounter (Signed)
Sleep center states that the order need to be changed to a Split night study in epic.

## 2016-11-27 NOTE — Progress Notes (Signed)
Paramedicine Encounter    Patient ID: Exavior Kimmons, male    DOB: [birth year], 61 y.o.   MRN: [id number]   Patient Care Team: Scot Jun, FNP as PCP - General (Family Medicine) [name], MD as Consulting Physician (Cardiology)  Patient Active Problem List   Diagnosis Date Noted  . Atrial fibrillation with RVR ([place]) 04/17/2016  . Elevated troponin 04/17/2016  . CHF (congestive heart failure) ([place]) 04/17/2016  . Septic olecranon bursitis of right elbow 09/19/2015  . Lactic acid acidosis   . Dilated cardiomyopathy ([place]) 04/19/2014  . Type 2 diabetes mellitus ([place]) 04/09/2014  . Non compliance with medical treatment and diet 04/09/2014  . Chronic anticoagulation-Eliquis started 04/06/14 04/09/2014  . Abnormal chest CT-LUL nodule- needs f/u May 2016 04/09/2014  . Acute on chronic combined systolic and diastolic heart failure ([place]) 04/05/2014  . OSA - C-pap 03/22/2013  . Persistent atrial fibrillation ([place]) 03/27/2011  . Obesity, Class III, BMI 40-49.9 (morbid obesity) ([place]) 03/27/2011  . URI, acute 03/27/2011    Current Outpatient Prescriptions:  .  amiodarone (PACERONE) 200 MG tablet, Take 1 tablet (200 mg total) by mouth daily., Disp: 30 tablet, Rfl: 3 .  apixaban (ELIQUIS) 5 MG TABS tablet, Take 1 tablet (5 mg total) by mouth 2 (two) times daily., Disp: 180 tablet, Rfl: 3 .  atorvastatin (LIPITOR) 80 MG tablet, Take 1 tablet (80 mg total) by mouth at bedtime., Disp: 30 tablet, Rfl: 2 .  furosemide (LASIX) 40 MG tablet, Take 1 tablet (40 mg total) by mouth daily., Disp: 30 tablet, Rfl: 4 .  metFORMIN (GLUCOPHAGE) 1000 MG tablet, take 1 tablet by mouth twice a day with food, Disp: 60 tablet, Rfl: 3 .  metoprolol succinate (TOPROL-XL) 50 MG 24 hr tablet, TAKE 1 TABLET BY MOUTH TWICE DAILY, Disp: 68 tablet, Rfl: 3 .  sacubitril-valsartan (ENTRESTO) 49-51 MG, Take 1 tablet by mouth 2 (two) times daily., Disp: 60 tablet, Rfl: 11 .  spironolactone (ALDACTONE) 25 MG tablet,  Take 1 tablet (25 mg total) by mouth daily., Disp: 30 tablet, Rfl: 2 .  Krill Oil (OMEGA-3) 500 MG CAPS, Take 1 capsule by mouth daily., Disp: , Rfl:  No Known Allergies   Social History   Social History  . Marital status: Single    Spouse name: N/A  . Number of children: N/A  . Years of education: N/A   Occupational History  . unemployeed    Social History Main Topics  . Smoking status: Never Smoker  . Smokeless tobacco: Never Used  . Alcohol use Yes     Comment: some  . Drug use: No  . Sexual activity: No   Other Topics Concern  . Not on file   Social History Narrative   Admits that he eats poorly and eats a lot at work.      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    Physical Exam  Constitutional: He is oriented to person, place, and time.  Cardiovascular: Normal rate and regular rhythm.   Pulmonary/Chest: Breath sounds normal. No respiratory distress. He has no wheezes. He has no rales.  Abdominal: Soft.  Musculoskeletal: Normal range of motion. He exhibits no edema or deformity.  Neurological: He is alert and oriented to person, place, and time.  Skin: Skin is warm and dry.  Psychiatric: He has a normal mood and affect.        Future Appointments Date Time Provider [place]  12/12/2016 9:30 AM [facility] LAB [facility]  None  01/06/2017 11:30 AM [facility] PA/NP [facility] None  02/13/2017 3:30 PM Scot Jun, FNP [facility] None   BP 98/66 (BP Location: Right Arm, Patient Position: Sitting, Cuff Size: Large)   Pulse 64   Resp 18   Wt 281 lb (127.5 kg)   SpO2 96%   BMI 36.08 kg/m  Weight yesterday- 286 lb Last visit weight- 297 lb  Mr Weatherall [facility] today. He reported feeling well and denied SOB, dizziness, headache or orthopnea. He had been compliant with all his medications while I was gone and followed up with his PCP appointment. His Medicaid has been approved and his card arrived. I set him up for two weeks and  he is agreeable to decrease visits to this interval. I will call in medications for him next week when they are eligible for refill.   Time spent with patient: 39 minutes  Jacquiline Doe, EMT 11/27/16  ACTION: Home visit completed Next visit planned for 2 weeks

## 2016-12-03 ENCOUNTER — Other Ambulatory Visit: Payer: Self-pay | Admitting: Cardiology

## 2016-12-03 MED FILL — ATORVASTATIN 80 MG TABLET: 80 | 30 days supply | Qty: 30 | Fill #1

## 2016-12-03 MED FILL — FUROSEMIDE 40 MG TABLET: 40 | 34 days supply | Qty: 34 | Fill #4

## 2016-12-03 MED FILL — SPIRONOLACTONE 25 MG TABLET: 25 | 30 days supply | Qty: 30 | Fill #1

## 2016-12-03 MED FILL — metFORMIN HCL 1000 MG TABS: 1000 | 30 days supply | Qty: 60 | Fill #1

## 2016-12-03 MED FILL — AMIODARONE HCL 200 MG TAB: 200 | 34 days supply | Qty: 34 | Fill #0

## 2016-12-11 ENCOUNTER — Encounter (HOSPITAL_COMMUNITY): Payer: Self-pay | Admitting: Cardiology

## 2016-12-11 ENCOUNTER — Other Ambulatory Visit (HOSPITAL_COMMUNITY): Payer: Self-pay

## 2016-12-11 ENCOUNTER — Ambulatory Visit (HOSPITAL_COMMUNITY)
Admission: RE | Admit: 2016-12-11 | Discharge: 2016-12-11 | Disposition: A | Discharge status: Home / Self Care | Payer: Medicaid Other | Source: Ambulatory Visit | Attending: Cardiology | Admitting: Cardiology

## 2016-12-11 VITALS — BP 118/66 | HR 74 | Wt 296.0 lb

## 2016-12-11 DIAGNOSIS — I5022 Chronic systolic (congestive) heart failure: Secondary | ICD-10-CM | POA: Insufficient documentation

## 2016-12-11 DIAGNOSIS — G4733 Obstructive sleep apnea (adult) (pediatric): Secondary | ICD-10-CM | POA: Insufficient documentation

## 2016-12-11 DIAGNOSIS — I481 Persistent atrial fibrillation: Secondary | ICD-10-CM

## 2016-12-11 DIAGNOSIS — I5042 Chronic combined systolic (congestive) and diastolic (congestive) heart failure: Secondary | ICD-10-CM

## 2016-12-11 DIAGNOSIS — Z79899 Other long term (current) drug therapy: Secondary | ICD-10-CM | POA: Diagnosis not present

## 2016-12-11 DIAGNOSIS — Z7984 Long term (current) use of oral hypoglycemic drugs: Secondary | ICD-10-CM | POA: Insufficient documentation

## 2016-12-11 DIAGNOSIS — I2510 Atherosclerotic heart disease of native coronary artery without angina pectoris: Secondary | ICD-10-CM | POA: Insufficient documentation

## 2016-12-11 DIAGNOSIS — I480 Paroxysmal atrial fibrillation: Secondary | ICD-10-CM | POA: Diagnosis present

## 2016-12-11 DIAGNOSIS — E119 Type 2 diabetes mellitus without complications: Secondary | ICD-10-CM | POA: Insufficient documentation

## 2016-12-11 DIAGNOSIS — I428 Other cardiomyopathies: Secondary | ICD-10-CM | POA: Insufficient documentation

## 2016-12-11 DIAGNOSIS — I4891 Unspecified atrial fibrillation: Secondary | ICD-10-CM

## 2016-12-11 DIAGNOSIS — I509 Heart failure, unspecified: Secondary | ICD-10-CM

## 2016-12-11 DIAGNOSIS — E785 Hyperlipidemia, unspecified: Secondary | ICD-10-CM | POA: Diagnosis not present

## 2016-12-11 DIAGNOSIS — Z7901 Long term (current) use of anticoagulants: Secondary | ICD-10-CM | POA: Diagnosis not present

## 2016-12-11 DIAGNOSIS — I4819 Other persistent atrial fibrillation: Secondary | ICD-10-CM

## 2016-12-11 MED ORDER — SACUBITRIL-VALSARTAN 97-103 MG PO TABS: 1.0000 | ORAL_TABLET | Freq: Two times a day (BID) | ORAL | 3 refills | Status: DC

## 2016-12-11 NOTE — Progress Notes (Signed)
Should be telephone encounter--  Called pt to try to make visit today for fill in for Zack, pt reports he filled his own pill box, he denies any sob, he stated he has been eating off a large bucket of chicken for numerous meals and hasnt weighed in 4 days. At the last [facility] visit his weight was 281 and today it was 300. Contacted clinic and they are able to see him today, called pt back to advise him to that and he is agreeable to go at 130 today.     Marylouise Stacks, EMT-Paramedic 12/11/16

## 2016-12-11 NOTE — Progress Notes (Deleted)
Should be telephone encounter

## 2016-12-11 NOTE — Patient Instructions (Signed)
EKG done.  Will refer you to electrophysiology at [facility] Address: [address]), [place], [place] [address]  Phone: [phone]  INCREASE Entresto to 97/103 mg tablet twice daily.  Return in 2 weeks for labs.  Follow up 3 months with Dr. [name].  Take all medication as prescribed the day of your appointment. Bring all medications with you to your appointment.  Do the following things EVERYDAY: 1) Weigh yourself in the morning before breakfast. Write it down and keep it in a log. 2) Take your medicines as prescribed 3) Eat low salt foods-Limit salt (sodium) to 2000 mg per day.  4) Stay as active as you can everyday 5) Limit all fluids for the day to less than 2 liters

## 2016-12-12 ENCOUNTER — Other Ambulatory Visit (INDEPENDENT_AMBULATORY_CARE_PROVIDER_SITE_OTHER): Payer: Self-pay

## 2016-12-12 DIAGNOSIS — E785 Hyperlipidemia, unspecified: Secondary | ICD-10-CM

## 2016-12-12 DIAGNOSIS — Z9989 Dependence on other enabling machines and devices: Secondary | ICD-10-CM

## 2016-12-12 DIAGNOSIS — I4891 Unspecified atrial fibrillation: Secondary | ICD-10-CM

## 2016-12-12 DIAGNOSIS — G4733 Obstructive sleep apnea (adult) (pediatric): Secondary | ICD-10-CM

## 2016-12-12 LAB — LIPID PANEL
CHOL/HDL RATIO: 1.9 (calc) (ref <5.0)
Cholesterol: 120 mg/dL (ref <200)
HDL: 62 mg/dL (ref >40)
LDL CHOLESTEROL (CALC): 42 mg/dL
NON-HDL CHOLESTEROL (CALC): 58 mg/dL (ref <130)
TRIGLYCERIDES: 77 mg/dL (ref <150)

## 2016-12-14 NOTE — Progress Notes (Signed)
Advanced Heart Failure Clinic Note   PCP: Colin Benton, DO  Primary HF Cardiologist:  Dr. [name]  HPI: 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   Admitted 04/16/08-08/05/92 for acute systolic CHF and atrial fibrillation with RVR. Had a R/L heart cath on 04/21/16, no CAD, right heart pressures elevated, low output. Echo showed EF 15%. Plan was to undergo TEE/DC-CV, however he was found to have a left atrial appendage thrombus and had only started Eliquis 5 days prior. He was discharged with plans for TEE/DC-CV in about a month. He was started on Entresto 24/26 mg BID during admission. Discharged home on Lasix 40 mg daily, digoxin 0.134m, and metoprolol XL 50 mg BID. Discharge 280 pounds.   Repeat TEE done in 4/18 for possible cardioversion.  EF remained 25-30%.  He still had a small, amorphous LA appendage thrombus (smaller than prior but still present).  He admitted to having missed some Eliquis doses.    He finally had TEE-guided DCCV in 7/18 with conversion to NSR.  TEE showed EF 40-45%.   He is doing well symptomatically.  He says that he feels much better out of atrial fibrillation. He is in NSR today.  Weight down 1 lb.  Taking all his medications.  No chest pain. No lightheadedness/syncope. He is not short of breath walking on flat ground and is able to walk around [employer] without problems. No orthopnea/PND.   ECG (personally reviewed): NSR, normal.  Cardiac Studies  RHC/LHC 04/21/2016  RA mean 14 RV 45/15 PA 50/28, mean 36 PCWP mean 22 LV 104/20 AO 101/75 Oxygen saturations: PA 59% AO 92% Cardiac Output (Fick) 4.85  Cardiac Index (Fick) 1.89 PVR 2.9 WU  - TEE: 04/2016 with LAA thrombus.  - TEE: 4/18 with EF 25-30%, diffuse hypokinesis, RV mildly dilated with moderately decreased systolic function, amorphous thrombus LA appendage  (improved compared to 3/18).  -TEE 09/08/2016 EF 40-45% No thrombus. Successful DC-CV  - ECHO 04/24/2016 EF 15% Peak PA pressure 38 mm hg   Labs  04/24/2016: K 4.5 Creatinine 1.00, digoxin < 0.2 4/18: K 4.3, creatinine 1.02 10/18: Thyroid panel normal, K 4.6, creatinine 1.05, LFTs normal, hgb 14.4  Review of systems complete and found to be negative unless listed in HPI.    SH:  Social History   Socioeconomic History  . Marital status: Single    Spouse name: Not on file  . Number of children: Not on file  . Years of education: Not on file  . Highest education level: Not on file  Social Needs  . Financial resource strain: Not on file  . Food insecurity - worry: Not on file  . Food insecurity - inability: Not on file  . Transportation needs - medical: Not on file  . Transportation needs - non-medical: Not on file  Occupational History  . Occupation: unemployeed  Tobacco Use  . Smoking status: Never Smoker  . Smokeless tobacco: Never Used  Substance and Sexual Activity  . Alcohol use: Yes    Comment: some  . Drug use: No  . Sexual activity: No  Other Topics Concern  . Not on file  Social History Narrative   Admits that he eats poorly and eats a lot at work.      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 Diabetes Father        died in his 50s  . Hypertension Father        died in her 49s  . Seizures Mother   . Hypertension Sister   . Hypertension Paternal Grandfather   . Heart attack Neg Hx   . Stroke Neg Hx     Past Medical History:  Diagnosis Date  . Atrial fibrillation ([place])    unable to tolerate versed and fentanyl sedation and required gen anesthesia for TEE;  s/p TEE-DCCV (failed);  Amiodarone started  . Chronic diastolic heart failure ([place])    a. Echo 03/27/11: EF 50-55%, inferior hypokinesis, moderate LAE, mild RVE, normal pulmonary pressures. ;   b.  TEE (04/03/11): EF 40-45%,  mild MR moderate LAE, no LAA clot, mild RAE;  c.  Echocardiogram (02/2013): EF 60-65%, Gr 2 DD, mildly dilated Ao root (Ao root dimension 39 mm), MAC, mild LAE, normal RVF  . Diabetes mellitus, type 2 ([place])   . Hx of cardiovascular stress test    Lexiscan Myoview (02/2013): No ischemia or scar, EF 51%, low risk  . Morbid obesity ([place])   . Sleep apnea    a. sleep test 01/2013:  very severy OSA    Current Outpatient Medications  Medication Sig Dispense Refill  . amiodarone (PACERONE) 200 MG tablet TAKE 1 TABLET BY MOUTH ONCE DAILY 34 tablet 2  . apixaban (ELIQUIS) 5 MG TABS tablet Take 1 tablet (5 mg total) by mouth 2 (two) times daily. 180 tablet 3  . atorvastatin (LIPITOR) 80 MG tablet Take 1 tablet (80 mg total) by mouth at bedtime. 30 tablet 2  . furosemide (LASIX) 40 MG tablet Take 1 tablet (40 mg total) by mouth daily. 30 tablet 4  . Krill Oil (OMEGA-3) 500 MG CAPS Take 1 capsule by mouth daily.    . metFORMIN (GLUCOPHAGE) 1000 MG tablet take 1 tablet by mouth twice a day with food 60 tablet 3  . metoprolol succinate (TOPROL-XL) 50 MG 24 hr tablet TAKE 1 TABLET BY MOUTH TWICE DAILY 68 tablet 3  . spironolactone (ALDACTONE) 25 MG tablet Take 1 tablet (25 mg total) by mouth daily. 30 tablet 2  . sacubitril-valsartan (ENTRESTO) 97-103 MG Take 1 tablet by mouth 2 (two) times daily. 180 tablet 3   No current facility-administered medications for this encounter.     Vitals:   12/11/16 1343  BP: 118/66  Pulse: 74  SpO2: 94%  Weight: 296 lb (134.3 kg)   Wt Readings from Last 3 Encounters:  12/11/16 296 lb (134.3 kg)  11/27/16 281 lb (127.5 kg)  11/12/16 297 lb (134.7 kg)    PHYSICAL EXAM: General: NAD Neck: No JVD, no thyromegaly or thyroid nodule.  Lungs: Clear to auscultation bilaterally with normal respiratory effort. CV: Nondisplaced PMI.  Heart regular S1/S2, no S3/S4, no murmur.  No peripheral edema.  No carotid bruit.  Normal pedal pulses.  Abdomen: Soft, nontender, no  hepatosplenomegaly, no distention.  Skin: Intact without lesions or rashes.  Neurologic: Alert and oriented x 3.  Psych: Normal affect. Extremities: No clubbing or cyanosis.  HEENT: Normal.   ASSESSMENT & PLAN:  1. Chronic systolic CHF: Had [facility] 03/7251 showing minimal CAD.  Nonischemic cardiomyopathy, possibly tachycardia-mediated in the setting of rapid atrial fibrillation.   Echo 04/2016 EF 15% but improved to 40-45% on TEE in 7/18. NYHA class II symptoms currently.  He is not volume overloaded.  - Increase Entresto  to 97/103 bid. BMET in 2 wks.     - Continue spironolactone 25 mg daily.  - Continue metoprolol XL 50 mg bid.  - Volume status stable. Continue Lasix 40 mg daily.  2. Atrial fibrillation: Paroxysmal atrial fibrillation. Now back in NSR after DCCV in 7/18. 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    - Continue amiodarone 200 mg daily.  Recent LFTs and TSH normal, will need regular eye exam.  - Continue apixaban 5 mg bid.   - I would like him considered for atrial fibrillation ablation.  Want to avoid long-term amiodarone, but given suspected tachy-mediated CMP, do not want him to go back into atrial fibrillation.  Will refer to EP.  3. HLD: Continue statin.  4. OSA: Continue CPAP nightly.    Followup in 3 months.   Loralie Champagne 12/14/2016

## 2016-12-17 ENCOUNTER — Other Ambulatory Visit (HOSPITAL_COMMUNITY): Payer: Self-pay

## 2016-12-17 ENCOUNTER — Telehealth (HOSPITAL_COMMUNITY): Payer: Self-pay

## 2016-12-17 MED FILL — METOPROLOL SUCC ER 50 MG TA: 50 | 34 days supply | Qty: 68 | Fill #1

## 2016-12-17 NOTE — Telephone Encounter (Signed)
I called Mr Darius [name] to schedule an appointment and talk about his reported weight gain last week. I left a VM requesting a call back.

## 2016-12-17 NOTE — Progress Notes (Signed)
Paramedicine Encounter    Patient ID: Burdett Pinzon, male    DOB: [birth year], 61 y.o.   MRN: [id number]   Patient Care Team: Scot Jun, FNP as PCP - General (Family Medicine) [name], MD as Consulting Physician (Cardiology)  Patient Active Problem List   Diagnosis Date Noted  . Atrial fibrillation with RVR ([place]) 04/17/2016  . Elevated troponin 04/17/2016  . CHF (congestive heart failure) ([place]) 04/17/2016  . Septic olecranon bursitis of right elbow 09/19/2015  . Lactic acid acidosis   . Dilated cardiomyopathy ([place]) 04/19/2014  . Type 2 diabetes mellitus ([place]) 04/09/2014  . Non compliance with medical treatment and diet 04/09/2014  . Chronic anticoagulation-Eliquis started 04/06/14 04/09/2014  . Abnormal chest CT-LUL nodule- needs f/u May 2016 04/09/2014  . Acute on chronic combined systolic and diastolic heart failure ([place]) 04/05/2014  . OSA - C-pap 03/22/2013  . Persistent atrial fibrillation ([place]) 03/27/2011  . Obesity, Class III, BMI 40-49.9 (morbid obesity) ([place]) 03/27/2011  . URI, acute 03/27/2011    Current Outpatient Medications:  .  amiodarone (PACERONE) 200 MG tablet, TAKE 1 TABLET BY MOUTH ONCE DAILY, Disp: 34 tablet, Rfl: 2 .  apixaban (ELIQUIS) 5 MG TABS tablet, Take 1 tablet (5 mg total) by mouth 2 (two) times daily., Disp: 180 tablet, Rfl: 3 .  atorvastatin (LIPITOR) 80 MG tablet, Take 1 tablet (80 mg total) by mouth at bedtime., Disp: 30 tablet, Rfl: 2 .  furosemide (LASIX) 40 MG tablet, Take 1 tablet (40 mg total) by mouth daily., Disp: 30 tablet, Rfl: 4 .  metFORMIN (GLUCOPHAGE) 1000 MG tablet, take 1 tablet by mouth twice a day with food, Disp: 60 tablet, Rfl: 3 .  metoprolol succinate (TOPROL-XL) 50 MG 24 hr tablet, TAKE 1 TABLET BY MOUTH TWICE DAILY, Disp: 68 tablet, Rfl: 3 .  sacubitril-valsartan (ENTRESTO) 97-103 MG, Take 1 tablet by mouth 2 (two) times daily., Disp: 180 tablet, Rfl: 3 .  spironolactone (ALDACTONE) 25 MG tablet, Take 1 tablet  (25 mg total) by mouth daily., Disp: 30 tablet, Rfl: 2 .  Krill Oil (OMEGA-3) 500 MG CAPS, Take 1 capsule by mouth daily., Disp: , Rfl:  No Known Allergies    Social History   Socioeconomic History  . Marital status: Single    Spouse name: Not on file  . Number of children: Not on file  . Years of education: Not on file  . Highest education level: Not on file  Social Needs  . Financial resource strain: Not on file  . Food insecurity - worry: Not on file  . Food insecurity - inability: Not on file  . Transportation needs - medical: Not on file  . Transportation needs - non-medical: Not on file  Occupational History  . Occupation: unemployeed  Tobacco Use  . Smoking status: Never Smoker  . Smokeless tobacco: Never Used  Substance and Sexual Activity  . Alcohol use: Yes    Comment: some  . Drug use: No  . Sexual activity: No  Other Topics Concern  . Not on file  Social History Narrative   Admits that he eats poorly and eats a lot at work.      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    Physical Exam  Constitutional: He is oriented to person, place, and time.  Cardiovascular: Normal rate and regular rhythm.  Pulmonary/Chest: Effort normal and breath sounds normal. No respiratory distress. He has no wheezes. He has no rales.  Abdominal: Soft. He  exhibits no distension.  Musculoskeletal: Normal range of motion. He exhibits edema.  Neurological: He is alert and oriented to person, place, and time.  Skin: Skin is warm and dry.        Future Appointments  Date Time Provider [place]  12/25/2016 10:30 AM [facility] None  12/31/2016  8:30 AM Allred, Jeneen Rinks, MD [facility]  02/13/2017  3:30 PM Scot Jun, FNP [facility] None    BP 112/76 (BP Location: Left Arm, Patient Position: Sitting, Cuff Size: Large)   Pulse 70   Resp 16   Wt (!) 301 lb (136.5 kg)   SpO2 97%   BMI 38.65 kg/m  Weight yesterday- 297  lb Last visit weight- 296 lb CBG- 104 mg/dl  Mr Newbury was [facility] today. He reported feeling generally well, denying SOB, dizziness, headache or orthopnea. He reports being compliant with his medications but is not following a heart healthy diet. He argues that he can't find food that doesn't have salt so this Friday I will be going to the grocery store with him to teach him to read labels and make better choices when shopping. We also talked about his mental health and he said he does not feel like he is happy and he said he does not have reasons to leave home unless it is to see me. I would like to look into volunteer opportunities for him to encourage a more social lifestyle.   Time spent with patient: 83 minutes   Jacquiline Doe, EMT 12/17/16  ACTION: Home visit completed Next visit planned for 1 week

## 2016-12-18 ENCOUNTER — Telehealth (HOSPITAL_COMMUNITY): Payer: Self-pay

## 2016-12-18 NOTE — Telephone Encounter (Signed)
I called Darius [name] to schedule an appointment. He did not answer so I left a voicemail for him to call back and schedule a visit.

## 2016-12-22 ENCOUNTER — Encounter: Payer: Self-pay | Admitting: Internal Medicine

## 2016-12-24 ENCOUNTER — Telehealth (HOSPITAL_COMMUNITY): Payer: Self-pay

## 2016-12-24 DIAGNOSIS — Z736 Limitation of activities due to disability: Secondary | ICD-10-CM

## 2016-12-24 NOTE — Telephone Encounter (Signed)
I called Darius [name] to see if his Darius [name] had arrived since Friday. He stated it has not. I asked him to contact the pharmacy and inquire about this. He called them and they advised the medication would be arriving on Friday. They stated that the clinic sending the new prescription did not mean that they would automatically fill it and he has to call each month to have it filled.

## 2016-12-25 ENCOUNTER — Other Ambulatory Visit (HOSPITAL_COMMUNITY): Payer: Self-pay

## 2016-12-31 ENCOUNTER — Telehealth (HOSPITAL_COMMUNITY): Payer: Self-pay

## 2016-12-31 ENCOUNTER — Institutional Professional Consult (permissible substitution): Payer: Self-pay | Admitting: Internal Medicine

## 2016-12-31 NOTE — Telephone Encounter (Signed)
Mr Darius [name] was called to schedule an appointment. He stated that he was OK this week and did not need a visit. I will follow up after the next week

## 2017-01-06 ENCOUNTER — Ambulatory Visit (HOSPITAL_COMMUNITY): Payer: Self-pay

## 2017-01-06 MED FILL — AMIODARONE HCL 200 MG TAB: 200 | 34 days supply | Qty: 34 | Fill #1

## 2017-01-06 MED FILL — SPIRONOLACTONE 25 MG TABLET: 25 | 30 days supply | Qty: 30 | Fill #2

## 2017-01-06 MED FILL — ATORVASTATIN 80 MG TABLET: 80 | 30 days supply | Qty: 30 | Fill #2

## 2017-01-06 MED FILL — FUROSEMIDE 40 MG TAB: 40 | 34 days supply | Qty: 34 | Fill #5

## 2017-01-06 MED FILL — metFORMIN HCL 1000 MG TABS: 1000 | 30 days supply | Qty: 60 | Fill #2

## 2017-01-08 ENCOUNTER — Other Ambulatory Visit (HOSPITAL_COMMUNITY): Payer: Self-pay

## 2017-01-08 NOTE — Progress Notes (Signed)
Paramedicine Encounter    Patient ID: Darius [name], male    DOB: Feb 27, 1955, 61 y.o.   MRN: [id number]   Patient Care Team: Scot Jun, FNP as PCP - General (Family Medicine) [name], MD as Consulting Physician (Cardiology)  Patient Active Problem List   Diagnosis Date Noted  . Atrial fibrillation with RVR ([place]) 04/17/2016  . Elevated troponin 04/17/2016  . CHF (congestive heart failure) ([place]) 04/17/2016  . Septic olecranon bursitis of right elbow 09/19/2015  . Lactic acid acidosis   . Dilated cardiomyopathy ([place]) 04/19/2014  . Type 2 diabetes mellitus ([place]) 04/09/2014  . Non compliance with medical treatment and diet 04/09/2014  . Chronic anticoagulation-Eliquis started 04/06/14 04/09/2014  . Abnormal chest CT-LUL nodule- needs f/u May 2016 04/09/2014  . Acute on chronic combined systolic and diastolic heart failure ([place]) 04/05/2014  . OSA - C-pap 03/22/2013  . Persistent atrial fibrillation ([place]) 03/27/2011  . Obesity, Class III, BMI 40-49.9 (morbid obesity) ([place]) 03/27/2011  . URI, acute 03/27/2011    Current Outpatient Medications:  .  amiodarone (PACERONE) 200 MG tablet, TAKE 1 TABLET BY MOUTH ONCE DAILY, Disp: 34 tablet, Rfl: 2 .  apixaban (ELIQUIS) 5 MG TABS tablet, Take 1 tablet (5 mg total) by mouth 2 (two) times daily., Disp: 180 tablet, Rfl: 3 .  atorvastatin (LIPITOR) 80 MG tablet, Take 1 tablet (80 mg total) by mouth at bedtime., Disp: 30 tablet, Rfl: 2 .  furosemide (LASIX) 40 MG tablet, Take 1 tablet (40 mg total) by mouth daily., Disp: 30 tablet, Rfl: 4 .  metFORMIN (GLUCOPHAGE) 1000 MG tablet, take 1 tablet by mouth twice a day with food, Disp: 60 tablet, Rfl: 3 .  metoprolol succinate (TOPROL-XL) 50 MG 24 hr tablet, TAKE 1 TABLET BY MOUTH TWICE DAILY, Disp: 68 tablet, Rfl: 3 .  sacubitril-valsartan (ENTRESTO) 97-103 MG, Take 1 tablet by mouth 2 (two) times daily., Disp: 180 tablet, Rfl: 3 .  spironolactone (ALDACTONE) 25 MG tablet, Take 1 tablet  (25 mg total) by mouth daily., Disp: 30 tablet, Rfl: 2 .  Krill Oil (OMEGA-3) 500 MG CAPS, Take 1 capsule by mouth daily., Disp: , Rfl:  No Known Allergies    Social History   Socioeconomic History  . Marital status: Single    Spouse name: Not on file  . Number of children: Not on file  . Years of education: Not on file  . Highest education level: Not on file  Social Needs  . Financial resource strain: Not on file  . Food insecurity - worry: Not on file  . Food insecurity - inability: Not on file  . Transportation needs - medical: Not on file  . Transportation needs - non-medical: Not on file  Occupational History  . Occupation: unemployeed  Tobacco Use  . Smoking status: Never Smoker  . Smokeless tobacco: Never Used  Substance and Sexual Activity  . Alcohol use: Yes    Comment: some  . Drug use: No  . Sexual activity: No  Other Topics Concern  . Not on file  Social History Narrative   Admits that he eats poorly and eats a lot at work.      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    Physical Exam  Constitutional: He is oriented to person, place, and time.  Cardiovascular: Normal rate and regular rhythm.  Pulmonary/Chest: Effort normal and breath sounds normal. No respiratory distress. He has no wheezes. He has no rales.  Abdominal: Soft.  Musculoskeletal: Normal range of motion. He exhibits edema.  Neurological: He is alert and oriented to person, place, and time.  Skin: Skin is warm and dry.  Psychiatric: He has a normal mood and affect.        Future Appointments  Date Time Provider [place]  01/21/2017 10:15 AM Thompson Grayer, MD [facility]  02/13/2017  3:30 PM Scot Jun, FNP [facility] None  03/11/2017 11:00 AM [name], MD [facility] None    BP 108/72 (BP Location: Right Arm, Patient Position: Sitting, Cuff Size: Normal)   Pulse 64   Resp 16   Wt (!) 301 lb (136.5 kg)   SpO2 97%   BMI 38.65  kg/m   Weight yesterday- Did not weigh Last visit weight- 301 lb  Darius [name] was seen at the [facility] today and reported feeling well. He has not weighed himself since we last aw each other because his scale "has been in the truck." He said he has not taken it inside because he forgets to bring it inside when he comes home and doesn't want to go back out to get it. I have reiterated the importance of daily weights and he said he would bring the scale in today and start using it again. His diet remains poor but he reports taking his medications as prescribed. His physical assessment leads me to believe that he is retaining fluid but his weight is not up substantially. His medications were verified and his pillbox was refilled.   Time spent with patient: 30 minutes  Jacquiline Doe, EMT 01/08/17  ACTION: Home visit completed Next visit planned for 2 weeks

## 2017-01-21 ENCOUNTER — Institutional Professional Consult (permissible substitution): Payer: Self-pay | Admitting: Internal Medicine

## 2017-01-22 ENCOUNTER — Other Ambulatory Visit (HOSPITAL_COMMUNITY): Payer: Self-pay

## 2017-01-22 ENCOUNTER — Telehealth (HOSPITAL_COMMUNITY): Payer: Self-pay

## 2017-01-22 NOTE — Progress Notes (Signed)
Paramedicine Encounter    Patient ID: Darius [name], male    DOB: [birth year], 61 y.o.   MRN: [id number]   Patient Care Team: Scot Jun, FNP as PCP - General (Family Medicine) [name], MD as Consulting Physician (Cardiology)  Patient Active Problem List   Diagnosis Date Noted  . Atrial fibrillation with RVR ([place]) 04/17/2016  . Elevated troponin 04/17/2016  . CHF (congestive heart failure) ([place]) 04/17/2016  . Septic olecranon bursitis of right elbow 09/19/2015  . Lactic acid acidosis   . Dilated cardiomyopathy ([place]) 04/19/2014  . Type 2 diabetes mellitus ([place]) 04/09/2014  . Non compliance with medical treatment and diet 04/09/2014  . Chronic anticoagulation-Eliquis started 04/06/14 04/09/2014  . Abnormal chest CT-LUL nodule- needs f/u May 2016 04/09/2014  . Acute on chronic combined systolic and diastolic heart failure ([place]) 04/05/2014  . OSA - C-pap 03/22/2013  . Persistent atrial fibrillation ([place]) 03/27/2011  . Obesity, Class III, BMI 40-49.9 (morbid obesity) ([place]) 03/27/2011  . URI, acute 03/27/2011    Current Outpatient Medications:  .  amiodarone (PACERONE) 200 MG tablet, TAKE 1 TABLET BY MOUTH ONCE DAILY, Disp: 34 tablet, Rfl: 2 .  apixaban (ELIQUIS) 5 MG TABS tablet, Take 1 tablet (5 mg total) by mouth 2 (two) times daily., Disp: 180 tablet, Rfl: 3 .  atorvastatin (LIPITOR) 80 MG tablet, Take 1 tablet (80 mg total) by mouth at bedtime., Disp: 30 tablet, Rfl: 2 .  furosemide (LASIX) 40 MG tablet, Take 1 tablet (40 mg total) by mouth daily., Disp: 30 tablet, Rfl: 4 .  metFORMIN (GLUCOPHAGE) 1000 MG tablet, take 1 tablet by mouth twice a day with food, Disp: 60 tablet, Rfl: 3 .  metoprolol succinate (TOPROL-XL) 50 MG 24 hr tablet, TAKE 1 TABLET BY MOUTH TWICE DAILY, Disp: 68 tablet, Rfl: 3 .  sacubitril-valsartan (ENTRESTO) 97-103 MG, Take 1 tablet by mouth 2 (two) times daily., Disp: 180 tablet, Rfl: 3 .  spironolactone (ALDACTONE) 25 MG tablet, Take 1 tablet  (25 mg total) by mouth daily., Disp: 30 tablet, Rfl: 2 .  Krill Oil (OMEGA-3) 500 MG CAPS, Take 1 capsule by mouth daily., Disp: , Rfl:  No Known Allergies    Social History   Socioeconomic History  . Marital status: Single    Spouse name: Not on file  . Number of children: Not on file  . Years of education: Not on file  . Highest education level: Not on file  Social Needs  . Financial resource strain: Not on file  . Food insecurity - worry: Not on file  . Food insecurity - inability: Not on file  . Transportation needs - medical: Not on file  . Transportation needs - non-medical: Not on file  Occupational History  . Occupation: unemployeed  Tobacco Use  . Smoking status: Never Smoker  . Smokeless tobacco: Never Used  Substance and Sexual Activity  . Alcohol use: Yes    Comment: some  . Drug use: No  . Sexual activity: No  Other Topics Concern  . Not on file  Social History Narrative   Admits that he eats poorly and eats a lot at work.      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    Physical Exam  Constitutional: He is oriented to person, place, and time.  Cardiovascular: Normal rate and regular rhythm.  Pulmonary/Chest: Effort normal and breath sounds normal. No respiratory distress. He has no wheezes. He has no rales.  Musculoskeletal: Normal range  of motion. He exhibits edema.  Neurological: He is alert and oriented to person, place, and time.  Skin: Skin is warm and dry.  Psychiatric: He has a normal mood and affect.        Future Appointments  Date Time Provider [place]  02/13/2017  3:30 PM Scot Jun, FNP [facility] None  03/11/2017 11:00 AM [name], MD [facility] None    BP 104/70 (BP Location: Left Arm, Patient Position: Sitting, Cuff Size: Large)   Pulse 68   Resp 16   Wt (!) 303 lb (137.4 kg)   SpO2 97%   BMI 38.90 kg/m   Weight yesterday- 304 lb Last visit weight- 301 lb  Darius [name] was seen at [facility] today and reported feeling well. He stated he has been taking his medications as prescribed. His weight has stayed above 300 lb which he stated is due to eating junk food while being snowed in. He denied SOB, dizziness, headache or orthopnea. His medications were verified and his pillbox was refilled.   Time spent with patient: 42 minutes  Darius [name], EMT 01/22/17  ACTION: Home visit completed Next visit planned for 2 weeks

## 2017-01-22 NOTE — Progress Notes (Signed)
Created in error

## 2017-01-22 NOTE — Telephone Encounter (Signed)
I called Darius [name] to confirm our appointment. His phone went directly to voicemail. I left a message to confirm the appointment.

## 2017-01-28 ENCOUNTER — Other Ambulatory Visit (HOSPITAL_COMMUNITY): Payer: Self-pay | Admitting: Cardiology

## 2017-01-28 MED FILL — metFORMIN HCL 1000 MG TABS: 1000 | 30 days supply | Qty: 60 | Fill #3

## 2017-01-28 MED FILL — METOPROLOL SUCC ER 50 MG TA: 50 | 34 days supply | Qty: 68 | Fill #2

## 2017-01-28 MED FILL — SPIRONOLACTONE 25 MG TABLET: 25 | 30 days supply | Qty: 30 | Fill #3

## 2017-01-30 MED FILL — ATORVASTATIN 80 MG TABLET: 80 | 30 days supply | Qty: 30 | Fill #0

## 2017-02-05 ENCOUNTER — Other Ambulatory Visit (HOSPITAL_COMMUNITY): Payer: Self-pay

## 2017-02-05 NOTE — Progress Notes (Signed)
Paramedicine Encounter    Patient ID: Darius [name], male    DOB: 11-03-1955, 61 y.o.   MRN: [id number]   Patient Care Team: Scot Jun, FNP as PCP - General (Family Medicine) [name], MD as Consulting Physician (Cardiology)  Patient Active Problem List   Diagnosis Date Noted  . Atrial fibrillation with RVR ([place]) 04/17/2016  . Elevated troponin 04/17/2016  . CHF (congestive heart failure) ([place]) 04/17/2016  . Septic olecranon bursitis of right elbow 09/19/2015  . Lactic acid acidosis   . Dilated cardiomyopathy ([place]) 04/19/2014  . Type 2 diabetes mellitus ([place]) 04/09/2014  . Non compliance with medical treatment and diet 04/09/2014  . Chronic anticoagulation-Eliquis started 04/06/14 04/09/2014  . Abnormal chest CT-LUL nodule- needs f/u May 2016 04/09/2014  . Acute on chronic combined systolic and diastolic heart failure ([place]) 04/05/2014  . OSA - C-pap 03/22/2013  . Persistent atrial fibrillation ([place]) 03/27/2011  . Obesity, Class III, BMI 40-49.9 (morbid obesity) ([place]) 03/27/2011  . URI, acute 03/27/2011    Current Outpatient Medications:  .  amiodarone (PACERONE) 200 MG tablet, TAKE 1 TABLET BY MOUTH ONCE DAILY, Disp: 34 tablet, Rfl: 2 .  apixaban (ELIQUIS) 5 MG TABS tablet, Take 1 tablet (5 mg total) by mouth 2 (two) times daily., Disp: 180 tablet, Rfl: 3 .  atorvastatin (LIPITOR) 80 MG tablet, TAKE 1 TABLET (80 MG TOTAL) BY MOUTH AT BEDTIME., Disp: 30 tablet, Rfl: 11 .  furosemide (LASIX) 40 MG tablet, Take 1 tablet (40 mg total) by mouth daily., Disp: 30 tablet, Rfl: 4 .  metFORMIN (GLUCOPHAGE) 1000 MG tablet, take 1 tablet by mouth twice a day with food, Disp: 60 tablet, Rfl: 3 .  metoprolol succinate (TOPROL-XL) 50 MG 24 hr tablet, TAKE 1 TABLET BY MOUTH TWICE DAILY, Disp: 68 tablet, Rfl: 3 .  sacubitril-valsartan (ENTRESTO) 97-103 MG, Take 1 tablet by mouth 2 (two) times daily., Disp: 180 tablet, Rfl: 3 .  spironolactone (ALDACTONE) 25 MG tablet, Take 1 tablet  (25 mg total) by mouth daily., Disp: 30 tablet, Rfl: 2 .  Krill Oil (OMEGA-3) 500 MG CAPS, Take 1 capsule by mouth daily., Disp: , Rfl:  No Known Allergies    Social History   Socioeconomic History  . Marital status: Single    Spouse name: Not on file  . Number of children: Not on file  . Years of education: Not on file  . Highest education level: Not on file  Social Needs  . Financial resource strain: Not on file  . Food insecurity - worry: Not on file  . Food insecurity - inability: Not on file  . Transportation needs - medical: Not on file  . Transportation needs - non-medical: Not on file  Occupational History  . Occupation: unemployeed  Tobacco Use  . Smoking status: Never Smoker  . Smokeless tobacco: Never Used  Substance and Sexual Activity  . Alcohol use: Yes    Comment: some  . Drug use: No  . Sexual activity: No  Other Topics Concern  . Not on file  Social History Narrative   Admits that he eats poorly and eats a lot at work.      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    Physical Exam  Cardiovascular: Normal rate and regular rhythm.  Pulmonary/Chest: Effort normal and breath sounds normal. No respiratory distress. He has no wheezes. He has no rales.  Abdominal: Soft. He exhibits no distension.  Musculoskeletal: Normal range of motion. He  exhibits edema.  Skin: Skin is warm and dry.  Psychiatric: He has a normal mood and affect.        Future Appointments  Date Time Provider [place]  02/13/2017  3:30 PM Scot Jun, FNP [facility] None  03/11/2017 11:00 AM [name], MD [facility] None    BP 122/74 (BP Location: Right Arm, Patient Position: Sitting, Cuff Size: Large)   Pulse 60   Resp 19   Wt (!) 304 lb (137.9 kg)   SpO2 97%   BMI 39.03 kg/m   Weight yesterday- Did not weigh Last visit weight- 303 lb  Mr Wickens was seen at the [facility] today and reported feeling generally well. He said he felt  like his weight has been up since eating over the holidays but he was only 1 pound up from two weeks ago. He reported taking all his medications during the holidays. His medications were verified and his pillbox was refilled.   Time spent with patient: 25 minutes  Jacquiline Doe, EMT 02/05/17  ACTION: Home visit completed Next visit planned for 1 week

## 2017-02-13 ENCOUNTER — Ambulatory Visit (INDEPENDENT_AMBULATORY_CARE_PROVIDER_SITE_OTHER): Payer: Self-pay | Admitting: Family Medicine

## 2017-02-13 ENCOUNTER — Encounter: Payer: Self-pay | Admitting: Family Medicine

## 2017-02-13 VITALS — BP 108/64 | HR 84 | Temp 97.7°F | Resp 12 | Ht 74.0 in | Wt 314.0 lb

## 2017-02-13 DIAGNOSIS — R635 Abnormal weight gain: Secondary | ICD-10-CM

## 2017-02-13 DIAGNOSIS — I509 Heart failure, unspecified: Secondary | ICD-10-CM

## 2017-02-13 DIAGNOSIS — E118 Type 2 diabetes mellitus with unspecified complications: Secondary | ICD-10-CM

## 2017-02-13 LAB — POCT URINALYSIS DIP (DEVICE)
Bilirubin Urine: NEGATIVE
GLUCOSE, UA: 500 mg/dL — AB
Hgb urine dipstick: NEGATIVE
KETONES UR: NEGATIVE mg/dL
NITRITE: NEGATIVE
PH: 5.5 (ref 5.0–8.0)
PROTEIN: NEGATIVE mg/dL
Specific Gravity, Urine: 1.025 (ref 1.005–1.030)
UROBILINOGEN UA: 0.2 mg/dL (ref 0.0–1.0)

## 2017-02-13 LAB — POCT GLYCOSYLATED HEMOGLOBIN (HGB A1C): HEMOGLOBIN A1C: 6.7

## 2017-02-13 NOTE — Progress Notes (Signed)
Patient ID: Darius [name], male    DOB: [birth year], 62 y.o.   MRN: [id number]  PCP: Scot Jun, FNP  Chief Complaint  Patient presents with  . Follow-up    Diabetes and Chronic Conditions     Subjective:  HPI Darius [name] is a 62 y.o. male with atrial fibrillation (Eliquis for anticoagulation), CHF, type 2 diabetes, morbid obesity presents to for routine 3 month follow-up.   Darius [name] is followed closely by [facility] and receives routine paramedic home visits to ensure medication adherence, evaluate vitals signs and overall condition.  Darius [name] reports chronic ongoing shortness of breath. He is not on home oxygen. Routinely has weight monitored by EMT. Admits to increased fluid intake which he attributes to increase thirst due to aggressive diuretic therapy. Denies lower extremity swelling, palpitation, headaches, or chest pain. Complains of abdominal distention although reports regular bowel movements. Darius [name] suffers from type 2 diabetes. Last A1C 7.5. He reports no routine monitoring of blood sugar at home. He is tolerated metformin. Denies neuropathic pain or vision changes. Current Body mass index is 40.32 kg/m. Social History   Socioeconomic History  . Marital status: Single    Spouse name: Not on file  . Number of children: Not on file  . Years of education: Not on file  . Highest education level: Not on file  Social Needs  . Financial resource strain: Not on file  . Food insecurity - worry: Not on file  . Food insecurity - inability: Not on file  . Transportation needs - medical: Not on file  . Transportation needs - non-medical: Not on file  Occupational History  . Occupation: unemployeed  Tobacco Use  . Smoking status: Never Smoker  . Smokeless tobacco: Never Used  Substance and Sexual Activity  . Alcohol use: Yes    Comment: some  . Drug use: No  . Sexual activity: No  Other Topics Concern  . Not on file  Social History Narrative   Admits that  he eats poorly and eats a lot at work.      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    Family History  Problem Relation Age of Onset  . Diabetes Father        died in his 33s  . Hypertension Father   . Seizures Mother        died in her 52s  . Hypertension Sister   . Hypertension Paternal Grandfather   . Heart attack Neg Hx   . Stroke Neg Hx    Review of Systems  Constitutional: Positive for fatigue.  HENT: Negative.   Respiratory: Positive for shortness of breath.   Cardiovascular: Negative for chest pain, palpitations and leg swelling.  Gastrointestinal: Positive for abdominal distention. Negative for constipation, diarrhea and nausea.  Endocrine: Positive for polydipsia.  Genitourinary: Negative.   Musculoskeletal: Negative.   Skin: Negative.   Neurological: Negative.   Psychiatric/Behavioral: Negative.     Patient Active Problem List   Diagnosis Date Noted  . Atrial fibrillation with RVR ([place]) 04/17/2016  . Elevated troponin 04/17/2016  . CHF (congestive heart failure) ([place]) 04/17/2016  . Septic olecranon bursitis of right elbow 09/19/2015  . Lactic acid acidosis   . Dilated cardiomyopathy ([place]) 04/19/2014  . Type 2 diabetes mellitus ([place]) 04/09/2014  . Non compliance with medical treatment and diet 04/09/2014  . Chronic anticoagulation-Eliquis started 04/06/14 04/09/2014  . Abnormal chest CT-LUL nodule- needs f/u May 2016 04/09/2014  . Acute on  chronic combined systolic and diastolic heart failure ([place]) 04/05/2014  . OSA - C-pap 03/22/2013  . Persistent atrial fibrillation ([place]) 03/27/2011  . Obesity, Class III, BMI 40-49.9 (morbid obesity) ([place]) 03/27/2011  . URI, acute 03/27/2011    No Known Allergies  Prior to Admission medications   Medication Sig Start Date End Date Taking? Authorizing Provider  amiodarone (PACERONE) 200 MG tablet TAKE 1 TABLET BY MOUTH ONCE DAILY 12/03/16  Yes Bensimhon, Shaune Pascal, MD  apixaban (ELIQUIS) 5 MG TABS  tablet Take 1 tablet (5 mg total) by mouth 2 (two) times daily. 05/08/16  Yes Clegg, Amy D, NP  atorvastatin (LIPITOR) 80 MG tablet TAKE 1 TABLET (80 MG TOTAL) BY MOUTH AT BEDTIME. 01/30/17  Yes [name], MD  furosemide (LASIX) 40 MG tablet Take 1 tablet (40 mg total) by mouth daily. 04/25/16  Yes Rai, Vernelle Emerald, MD  Javier Docker Oil (OMEGA-3) 500 MG CAPS Take 1 capsule by mouth daily.   Yes [provider]  metFORMIN (GLUCOPHAGE) 1000 MG tablet take 1 tablet by mouth twice a day with food 11/12/16  Yes Scot Jun, FNP  metoprolol succinate (TOPROL-XL) 50 MG 24 hr tablet TAKE 1 TABLET BY MOUTH TWICE DAILY 11/17/16  Yes Bensimhon, Shaune Pascal, MD  sacubitril-valsartan (ENTRESTO) 97-103 MG Take 1 tablet by mouth 2 (two) times daily. 12/11/16  Yes [name], MD  spironolactone (ALDACTONE) 25 MG tablet Take 1 tablet (25 mg total) by mouth daily. 08/01/16 02/05/17  [name], MD    Past Medical, Surgical Family and Social History reviewed and updated.    Objective:   Today's Vitals   02/13/17 1534  BP: 108/64  Pulse: 84  Resp: 12  Temp: 97.7 F (36.5 C)  TempSrc: Oral  SpO2: 98%  Weight: (!) 314 lb (142.4 kg)  Height: 6\' 2"  (1.88 m)    Wt Readings from Last 3 Encounters:  02/13/17 (!) 314 lb (142.4 kg)  02/05/17 (!) 304 lb (137.9 kg)  01/22/17 (!) 303 lb (137.4 kg)   Physical Exam  Constitutional: He appears well-developed and well-nourished.  HENT:  Head: Normocephalic and atraumatic.  Eyes: Conjunctivae are normal. Pupils are equal, round, and reactive to light.  Neck: Normal range of motion. Neck supple. No thyromegaly present.  Cardiovascular: Normal rate, regular rhythm, normal heart sounds and intact distal pulses.  Pulmonary/Chest: Effort normal and breath sounds normal.  Abdominal: Soft. Bowel sounds are normal. He exhibits distension. There is no tenderness. There is no rebound.  Musculoskeletal: Normal range of motion.  Trace edema lower  ankle-bilateral   Lymphadenopathy:    He has no cervical adenopathy.  Neurological: He is alert.  Skin: Skin is warm and dry.  Psychiatric: He has a normal mood and affect. His behavior is normal. Judgment and thought content normal.     Assessment & Plan:  1. Type 2 diabetes mellitus with complication, without long-term current use of insulin ([place]), improved and controlled today. A1C at goal 6.7. Continue metformin.   2. Chronic congestive heart failure, unspecified heart failure type ([place]), asymptomatic of dyspnea or respiratory distress. Concern regarding excessive weight again (11 lbs) over the course of 9 days. Checking a BNP and CMP today. Recommending strict fluid restriction -1500 ml total daily of fluid intake. Next ENT home visit scheduled for 02/19/2017.   3. Abnormal weight gain, unintentional weight gain of 11 lbs within 9 days. No evidence of fluid overload during today's exam. Obtaining a BNP. Recommending strict fluid restriction  1500 ml per day. -If you experiencing greater than 3 lb weight gain within 24 hours, notify our clinic or if after hours report to the Emergency Department. -Avoid adding table salt or eating food such as can soup or frozen meals which are high in sodium. This increases fluid retention and is likely to worsen your heart failure.  Orders Placed This Encounter  Procedures  . Comprehensive metabolic panel  . Brain natriuretic peptide  . POCT urinalysis dip (device)  . POCT glycosylated hemoglobin (Hb A1C)    RTC: 3 months, repeat A1C, PNA vaccine, diabetic foot exam, refer for diabetic eye exam   A total of 20 minutes spent, greater than 50 % of this time was spent reviewing prior medical history, reviewing medications and indications of treatment, prior labs and diagnostic tests, discussing current plan of treatment, health promotion, and goals of treatment.   Carroll Sage. Kenton Kingfisher, MSN, FNP-C [facility]  [address] [place], [place] [address] [phone]

## 2017-02-13 NOTE — Patient Instructions (Addendum)
You will be notified of any abnormal labs. You've gain 11 lbs in 9 days according to last weight measurement.   You should limit consumption of fluids to no more than 1500 ml per day equals approximately 6 glasses of water.   Evidence supports cardiovascular outcomes improve in those who follow the mediterranean and or DASH diet. The Mediterranean diet emphasizes: Eating primarily plant-based foods, such as fruits and vegetables, whole grains, legumes and nuts. Replacing butter with healthy fats such as olive oil and canola oil. Using herbs and spices instead of salt to flavor foods The DASH diet eating plan is a diet rich in fruits, vegetables, low fat or nonfat dairy. It also includes mostly whole grains; lean meats, fish and poultry; nuts and beans. It is high fiber and low to moderate in fat. It is a plan that follows US guidelines for sodium content, along with vitamins and minerals.

## 2017-02-14 LAB — COMPREHENSIVE METABOLIC PANEL
A/G RATIO: 1.5 (ref 1.2–2.2)
ALK PHOS: 71 IU/L (ref 39–117)
ALT: 23 IU/L (ref 0–44)
AST: 16 IU/L (ref 0–40)
Albumin: 4.1 g/dL (ref 3.6–4.8)
BUN/Creatinine Ratio: 18 (ref 10–24)
BUN: 17 mg/dL (ref 8–27)
Bilirubin Total: 0.5 mg/dL (ref 0.0–1.2)
CO2: 22 mmol/L (ref 20–29)
Calcium: 9.4 mg/dL (ref 8.6–10.2)
Chloride: 101 mmol/L (ref 96–106)
Creatinine, Ser: 0.97 mg/dL (ref 0.76–1.27)
GFR calc Af Amer: 96 mL/min/{1.73_m2} (ref >59)
GFR calc non Af Amer: 83 mL/min/{1.73_m2} (ref >59)
GLOBULIN, TOTAL: 2.8 g/dL (ref 1.5–4.5)
Glucose: 183 mg/dL — ABNORMAL HIGH (ref 65–99)
POTASSIUM: 4.6 mmol/L (ref 3.5–5.2)
SODIUM: 138 mmol/L (ref 134–144)
Total Protein: 6.9 g/dL (ref 6.0–8.5)

## 2017-02-14 LAB — BRAIN NATRIURETIC PEPTIDE: BNP: 129.4 pg/mL — AB (ref 0.0–100.0)

## 2017-02-19 ENCOUNTER — Other Ambulatory Visit (HOSPITAL_COMMUNITY): Payer: Self-pay

## 2017-02-19 NOTE — Progress Notes (Addendum)
Paramedicine Encounter    Patient ID: Darius [name], male    DOB: 12/15/1955, 62 y.o.   MRN: [id number]   Patient Care Team: Scot Jun, FNP as PCP - General (Family Medicine) [name], MD as Consulting Physician (Cardiology)  Patient Active Problem List   Diagnosis Date Noted  . Atrial fibrillation with RVR ([place]) 04/17/2016  . Elevated troponin 04/17/2016  . CHF (congestive heart failure) ([place]) 04/17/2016  . Septic olecranon bursitis of right elbow 09/19/2015  . Lactic acid acidosis   . Dilated cardiomyopathy ([place]) 04/19/2014  . Type 2 diabetes mellitus ([place]) 04/09/2014  . Non compliance with medical treatment and diet 04/09/2014  . Chronic anticoagulation-Eliquis started 04/06/14 04/09/2014  . Abnormal chest CT-LUL nodule- needs f/u May 2016 04/09/2014  . Acute on chronic combined systolic and diastolic heart failure ([place]) 04/05/2014  . OSA - C-pap 03/22/2013  . Persistent atrial fibrillation ([place]) 03/27/2011  . Obesity, Class III, BMI 40-49.9 (morbid obesity) ([place]) 03/27/2011  . URI, acute 03/27/2011    Current Outpatient Medications:  .  amiodarone (PACERONE) 200 MG tablet, TAKE 1 TABLET BY MOUTH ONCE DAILY, Disp: 34 tablet, Rfl: 2 .  apixaban (ELIQUIS) 5 MG TABS tablet, Take 1 tablet (5 mg total) by mouth 2 (two) times daily., Disp: 180 tablet, Rfl: 3 .  atorvastatin (LIPITOR) 80 MG tablet, TAKE 1 TABLET (80 MG TOTAL) BY MOUTH AT BEDTIME., Disp: 30 tablet, Rfl: 11 .  furosemide (LASIX) 40 MG tablet, Take 1 tablet (40 mg total) by mouth daily., Disp: 30 tablet, Rfl: 4 .  metFORMIN (GLUCOPHAGE) 1000 MG tablet, take 1 tablet by mouth twice a day with food, Disp: 60 tablet, Rfl: 3 .  metoprolol succinate (TOPROL-XL) 50 MG 24 hr tablet, TAKE 1 TABLET BY MOUTH TWICE DAILY, Disp: 68 tablet, Rfl: 3 .  sacubitril-valsartan (ENTRESTO) 97-103 MG, Take 1 tablet by mouth 2 (two) times daily., Disp: 180 tablet, Rfl: 3 .  spironolactone (ALDACTONE) 25 MG tablet, Take 1 tablet  (25 mg total) by mouth daily., Disp: 30 tablet, Rfl: 2 .  Krill Oil (OMEGA-3) 500 MG CAPS, Take 1 capsule by mouth daily., Disp: , Rfl:  No Known Allergies    Social History   Socioeconomic History  . Marital status: Single    Spouse name: Not on file  . Number of children: Not on file  . Years of education: Not on file  . Highest education level: Not on file  Social Needs  . Financial resource strain: Not on file  . Food insecurity - worry: Not on file  . Food insecurity - inability: Not on file  . Transportation needs - medical: Not on file  . Transportation needs - non-medical: Not on file  Occupational History  . Occupation: unemployeed  Tobacco Use  . Smoking status: Never Smoker  . Smokeless tobacco: Never Used  Substance and Sexual Activity  . Alcohol use: Yes    Comment: some  . Drug use: No  . Sexual activity: No  Other Topics Concern  . Not on file  Social History Narrative   Admits that he eats poorly and eats a lot at work.      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    Physical Exam  Constitutional: He is oriented to person, place, and time.  Cardiovascular: Normal rate and regular rhythm.  Pulmonary/Chest: Effort normal and breath sounds normal. No respiratory distress. He has no wheezes. He has no rales.  Abdominal: Soft.  Musculoskeletal: Normal range of motion. He exhibits edema.  Neurological: He is alert and oriented to person, place, and time.  Skin: Skin is warm and dry.        Future Appointments  Date Time Provider [place]  03/11/2017 11:00 AM [name], MD [facility] None  05/15/2017  2:30 PM Scot Jun, FNP [facility] None    BP (!) 84/50 (BP Location: Left Arm, Patient Position: Sitting, Cuff Size: Large)   Pulse 76   Resp 16   Wt (!) 302 lb (137 kg)   SpO2 93%   BMI 38.77 kg/m   Weight yesterday- Did not weigh Last visit weight- 304 lb CBG: 151 mg/dl  Mr Ferron was seen at [facility] today and reported feeling well. He denied SOB. Headache, dizziness or orthopnea. He reports being compliant with his medications. His medications were verified and his pillbox was refilled. He was hypotensive but stated that he had just taken his medications about 2 hours ago. I contacted the clinic who advised that since he had just taken the medications that he was probably just at their peak action and that no changes were necessary at this time.   Need:  amiodarone in second week Wednesday and Thursday morning Lasix in second week on Thursday morning  Time spent with patient: 32 minutes   Jacquiline Doe, EMT 02/19/17  ACTION: Home visit completed Next visit planned for 2 weeks

## 2017-03-03 ENCOUNTER — Other Ambulatory Visit: Payer: Self-pay | Admitting: Family Medicine

## 2017-03-03 DIAGNOSIS — E119 Type 2 diabetes mellitus without complications: Secondary | ICD-10-CM

## 2017-03-03 MED FILL — METOPROLOL SUCC ER 50 MG TA: 50 | 34 days supply | Qty: 68 | Fill #3

## 2017-03-03 MED FILL — metFORMIN HCL 1000 MG TABS: 1000 | 30 days supply | Qty: 60 | Fill #0

## 2017-03-03 MED FILL — AMIODARONE HCL 200 MG TAB: 200 | 34 days supply | Qty: 34 | Fill #2

## 2017-03-03 MED FILL — SPIRONOLACTONE 25 MG TABLET: 25 | 30 days supply | Qty: 30 | Fill #4

## 2017-03-03 MED FILL — ATORVASTATIN 80 MG TABLET: 80 | 30 days supply | Qty: 30 | Fill #1

## 2017-03-03 MED FILL — FUROSEMIDE 40 MG TAB: 40 | 30 days supply | Qty: 30 | Fill #6

## 2017-03-05 ENCOUNTER — Other Ambulatory Visit (HOSPITAL_COMMUNITY): Payer: Self-pay

## 2017-03-05 NOTE — Progress Notes (Signed)
Paramedicine Encounter    Patient ID: Darius [name], male    DOB: 07/04/1955, 62 y.o.   MRN: [id number]   Patient Care Team: Scot Jun, FNP as PCP - General (Family Medicine) [name], MD as Consulting Physician (Cardiology)  Patient Active Problem List   Diagnosis Date Noted  . Atrial fibrillation with RVR ([place]) 04/17/2016  . Elevated troponin 04/17/2016  . CHF (congestive heart failure) ([place]) 04/17/2016  . Septic olecranon bursitis of right elbow 09/19/2015  . Lactic acid acidosis   . Dilated cardiomyopathy ([place]) 04/19/2014  . Type 2 diabetes mellitus ([place]) 04/09/2014  . Non compliance with medical treatment and diet 04/09/2014  . Chronic anticoagulation-Eliquis started 04/06/14 04/09/2014  . Abnormal chest CT-LUL nodule- needs f/u May 2016 04/09/2014  . Acute on chronic combined systolic and diastolic heart failure ([place]) 04/05/2014  . OSA - C-pap 03/22/2013  . Persistent atrial fibrillation ([place]) 03/27/2011  . Obesity, Class III, BMI 40-49.9 (morbid obesity) ([place]) 03/27/2011  . URI, acute 03/27/2011    Current Outpatient Medications:  .  amiodarone (PACERONE) 200 MG tablet, TAKE 1 TABLET BY MOUTH ONCE DAILY, Disp: 34 tablet, Rfl: 2 .  apixaban (ELIQUIS) 5 MG TABS tablet, Take 1 tablet (5 mg total) by mouth 2 (two) times daily., Disp: 180 tablet, Rfl: 3 .  atorvastatin (LIPITOR) 80 MG tablet, TAKE 1 TABLET (80 MG TOTAL) BY MOUTH AT BEDTIME., Disp: 30 tablet, Rfl: 11 .  furosemide (LASIX) 40 MG tablet, Take 1 tablet (40 mg total) by mouth daily., Disp: 30 tablet, Rfl: 4 .  metFORMIN (GLUCOPHAGE) 1000 MG tablet, TAKE 1 TABLET BY MOUTH TWICE A DAY WITH FOOD, Disp: 60 tablet, Rfl: 3 .  metoprolol succinate (TOPROL-XL) 50 MG 24 hr tablet, TAKE 1 TABLET BY MOUTH TWICE DAILY, Disp: 68 tablet, Rfl: 3 .  sacubitril-valsartan (ENTRESTO) 97-103 MG, Take 1 tablet by mouth 2 (two) times daily., Disp: 180 tablet, Rfl: 3 .  spironolactone (ALDACTONE) 25 MG tablet, Take 1 tablet  (25 mg total) by mouth daily., Disp: 30 tablet, Rfl: 2 .  Krill Oil (OMEGA-3) 500 MG CAPS, Take 1 capsule by mouth daily., Disp: , Rfl:  No Known Allergies    Social History   Socioeconomic History  . Marital status: Single    Spouse name: Not on file  . Number of children: Not on file  . Years of education: Not on file  . Highest education level: Not on file  Social Needs  . Financial resource strain: Not on file  . Food insecurity - worry: Not on file  . Food insecurity - inability: Not on file  . Transportation needs - medical: Not on file  . Transportation needs - non-medical: Not on file  Occupational History  . Occupation: unemployeed  Tobacco Use  . Smoking status: Never Smoker  . Smokeless tobacco: Never Used  Substance and Sexual Activity  . Alcohol use: Yes    Comment: some  . Drug use: No  . Sexual activity: No  Other Topics Concern  . Not on file  Social History Narrative   Admits that he eats poorly and eats a lot at work.      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    Physical Exam  Constitutional: He is oriented to person, place, and time.  Cardiovascular: Normal rate and regular rhythm.  Pulmonary/Chest: Effort normal and breath sounds normal. No respiratory distress. He has no wheezes. He has no rales.  Abdominal: Soft.  Musculoskeletal: Normal range of motion. He exhibits no edema.  Neurological: He is alert and oriented to person, place, and time.  Skin: Skin is warm and dry.  Psychiatric: He has a normal mood and affect.        Future Appointments  Date Time Provider [place]  03/11/2017 11:00 AM [name], MD [facility] None  05/15/2017  2:30 PM Scot Jun, FNP [facility] None    BP 98/60 (BP Location: Right Arm, Patient Position: Sitting, Cuff Size: Large)   Pulse 70   Resp 16   Wt (!) 312 lb 6.4 oz (141.7 kg)   SpO2 96%   BMI 40.11 kg/m   Weight yesterday- Did not weigh Last visit weight-  302 lb CBG- 176 mg/dL  Darius [name] was seen at the [facility] today. He reported feeling well and denied SOB, headache, dizziness, orthopnea or chest discomfort. He has been compliant with his medications but has not weighed himself in the past several days because his scale is not working. We looked at the scale and it appeared to be working appropriately. His medications were verified and his pillbox was refilled. He stated he has not been sleeping at night but it is because he is watching television. As a result he has been sleeping late and not taking his medications until later in the day. He reports still being able to get both doses in by the end of each day.   Time spent with patient: 39 minutes  Jacquiline Doe, EMT 03/05/17  ACTION: Home visit completed Next visit planned for 2 weeks

## 2017-03-06 ENCOUNTER — Telehealth (HOSPITAL_COMMUNITY): Payer: Self-pay

## 2017-03-06 NOTE — Telephone Encounter (Signed)
I called Mr Darius [name] to make him aware of an order from [name] to take and additional 40 mg of furosemide BID for the next four days due to his weight gain. He did not answer so I left the aforementioned information on a voicemail and advised him to call me back if he had any questions.

## 2017-03-11 ENCOUNTER — Ambulatory Visit (HOSPITAL_COMMUNITY)
Admission: RE | Admit: 2017-03-11 | Discharge: 2017-03-11 | Disposition: A | Discharge status: Home / Self Care | Payer: Medicaid Other | Source: Ambulatory Visit | Attending: Cardiology | Admitting: Cardiology

## 2017-03-11 ENCOUNTER — Other Ambulatory Visit (HOSPITAL_COMMUNITY): Payer: Self-pay

## 2017-03-11 ENCOUNTER — Encounter (HOSPITAL_COMMUNITY): Payer: Self-pay | Admitting: Cardiology

## 2017-03-11 VITALS — BP 103/68 | HR 67 | Wt 306.0 lb

## 2017-03-11 DIAGNOSIS — Z79899 Other long term (current) drug therapy: Secondary | ICD-10-CM | POA: Insufficient documentation

## 2017-03-11 DIAGNOSIS — I5022 Chronic systolic (congestive) heart failure: Secondary | ICD-10-CM | POA: Diagnosis not present

## 2017-03-11 DIAGNOSIS — G4733 Obstructive sleep apnea (adult) (pediatric): Secondary | ICD-10-CM | POA: Insufficient documentation

## 2017-03-11 DIAGNOSIS — Z7901 Long term (current) use of anticoagulants: Secondary | ICD-10-CM | POA: Diagnosis not present

## 2017-03-11 DIAGNOSIS — Z7984 Long term (current) use of oral hypoglycemic drugs: Secondary | ICD-10-CM | POA: Insufficient documentation

## 2017-03-11 DIAGNOSIS — I2510 Atherosclerotic heart disease of native coronary artery without angina pectoris: Secondary | ICD-10-CM | POA: Insufficient documentation

## 2017-03-11 DIAGNOSIS — I428 Other cardiomyopathies: Secondary | ICD-10-CM | POA: Insufficient documentation

## 2017-03-11 DIAGNOSIS — I4891 Unspecified atrial fibrillation: Secondary | ICD-10-CM

## 2017-03-11 DIAGNOSIS — I480 Paroxysmal atrial fibrillation: Secondary | ICD-10-CM | POA: Diagnosis not present

## 2017-03-11 DIAGNOSIS — E785 Hyperlipidemia, unspecified: Secondary | ICD-10-CM | POA: Insufficient documentation

## 2017-03-11 DIAGNOSIS — I5082 Biventricular heart failure: Secondary | ICD-10-CM | POA: Insufficient documentation

## 2017-03-11 DIAGNOSIS — E119 Type 2 diabetes mellitus without complications: Secondary | ICD-10-CM | POA: Insufficient documentation

## 2017-03-11 DIAGNOSIS — I4819 Other persistent atrial fibrillation: Secondary | ICD-10-CM

## 2017-03-11 DIAGNOSIS — I5042 Chronic combined systolic (congestive) and diastolic (congestive) heart failure: Secondary | ICD-10-CM

## 2017-03-11 DIAGNOSIS — I481 Persistent atrial fibrillation: Secondary | ICD-10-CM

## 2017-03-11 MED ORDER — METOPROLOL SUCCINATE ER 25 MG PO TB24: 75.0000 mg | ORAL_TABLET | Freq: Two times a day (BID) | ORAL | 3 refills | Status: DC

## 2017-03-11 NOTE — Progress Notes (Signed)
Advanced Heart Failure Clinic Note   PCP: Colin Benton, DO  HF Cardiologist:  Dr. [name]  HPI: Mr. Crutchfield is a 62 y.o. 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   Admitted 06/14/54-2/56/38 for acute systolic CHF and atrial fibrillation with RVR. Had a R/L heart cath on 04/21/16, no CAD, right heart pressures elevated, low output. Echo showed EF 15%. Plan was to undergo TEE/DC-CV, however he was found to have a left atrial appendage thrombus and had only started Eliquis 5 days prior. He was discharged with plans for TEE/DC-CV in about a month. He was started on Entresto 24/26 mg BID during admission. Discharged home on Lasix 40 mg daily, digoxin 0.153m, and metoprolol XL 50 mg BID. Discharge 280 pounds.   Repeat TEE done in 4/18 for possible cardioversion.  EF remained 25-30%.  He still had a small, amorphous LA appendage thrombus (smaller than prior but still present).  He admitted to having missed some Eliquis doses.    He finally had TEE-guided DCCV in 7/18 with conversion to NSR.  TEE showed EF 40-45%.   He returns for followup of CHF and atrial fibrillation.  He is in NSR today.  No palpitations.  No exertional dyspnea.  Able to grocery shop, go to restaurants, etc without problems.  Weight is up 10 lbs.  He has been eating out a lot.  No orthopnea/PND.  No chest pain.  Missed EP appt because of snow.   ECG (personally reviewed): NSR, poor RWP  Cardiac Studies  RHC/LHC 04/21/2016  RA mean 14 RV 45/15 PA 50/28, mean 36 PCWP mean 22 LV 104/20 AO 101/75 Oxygen saturations: PA 59% AO 92% Cardiac Output (Fick) 4.85  Cardiac Index (Fick) 1.89 PVR 2.9 WU  - TEE: 04/2016 with LAA thrombus.  - TEE: 4/18 with EF 25-30%, diffuse hypokinesis, RV mildly dilated with moderately decreased systolic function, amorphous thrombus LA appendage (improved compared to 3/18).  -TEE  09/08/2016 EF 40-45% No thrombus. Successful DC-CV  - ECHO 04/24/2016 EF 15% Peak PA pressure 38 mm hg   Labs  04/24/2016: K 4.5 Creatinine 1.00, digoxin < 0.2 4/18: K 4.3, creatinine 1.02 10/18: Thyroid panel normal, K 4.6, creatinine 1.05, LFTs normal, hgb 14.4 11/18: LDL 42, HDL 62 1/19: K 4.6, creatinine 0.97, LFTs normal, BNP 129  Review of systems complete and found to be negative unless listed in HPI.    SH:  Social History   Socioeconomic History  . Marital status: Single    Spouse name: Not on file  . Number of children: Not on file  . Years of education: Not on file  . Highest education level: Not on file  Social Needs  . Financial resource strain: Not on file  . Food insecurity - worry: Not on file  . Food insecurity - inability: Not on file  . Transportation needs - medical: Not on file  . Transportation needs - non-medical: Not on file  Occupational History  . Occupation: unemployeed  Tobacco Use  . Smoking status: Never Smoker  . Smokeless tobacco: Never Used  Substance and Sexual Activity  . Alcohol use: Yes    Comment: some  . Drug use: No  . Sexual activity: No  Other Topics Concern  . Not on file  Social History Narrative   Admits that he eats poorly and eats a lot at work.      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    FH:  Family History  Problem Relation Age of Onset  . Diabetes Father        died in his 61s  . Hypertension Father   . Seizures Mother        died in her 2s  . Hypertension Sister   . Hypertension Paternal Grandfather   . Heart attack Neg Hx   . Stroke Neg Hx     Past Medical History:  Diagnosis Date  . Atrial fibrillation ([place])    unable to tolerate versed and fentanyl sedation and required gen anesthesia for TEE;  s/p TEE-DCCV (failed);  Amiodarone started  . Chronic diastolic heart failure ([place])    a. Echo 03/27/11: EF 50-55%, inferior hypokinesis, moderate LAE, mild RVE, normal pulmonary  pressures. ;   b.  TEE (04/03/11): EF 40-45%, mild MR moderate LAE, no LAA clot, mild RAE;  c.  Echocardiogram (02/2013): EF 60-65%, Gr 2 DD, mildly dilated Ao root (Ao root dimension 39 mm), MAC, mild LAE, normal RVF  . Diabetes mellitus, type 2 ([place])   . Hx of cardiovascular stress test    Lexiscan Myoview (02/2013): No ischemia or scar, EF 51%, low risk  . Morbid obesity ([place])   . Sleep apnea    a. sleep test 01/2013:  very severy OSA    Current Outpatient Medications  Medication Sig Dispense Refill  . amiodarone (PACERONE) 200 MG tablet TAKE 1 TABLET BY MOUTH ONCE DAILY 34 tablet 2  . apixaban (ELIQUIS) 5 MG TABS tablet Take 1 tablet (5 mg total) by mouth 2 (two) times daily. 180 tablet 3  . atorvastatin (LIPITOR) 80 MG tablet TAKE 1 TABLET (80 MG TOTAL) BY MOUTH AT BEDTIME. 30 tablet 11  . furosemide (LASIX) 40 MG tablet Take 1 tablet (40 mg total) by mouth daily. 30 tablet 4  . Krill Oil (OMEGA-3) 500 MG CAPS Take 1 capsule by mouth daily.    . metFORMIN (GLUCOPHAGE) 1000 MG tablet TAKE 1 TABLET BY MOUTH TWICE A DAY WITH FOOD 60 tablet 3  . metoprolol succinate (TOPROL-XL) 25 MG 24 hr tablet Take 3 tablets (75 mg total) by mouth 2 (two) times daily. Take with or immediately following a meal. 180 tablet 3  . sacubitril-valsartan (ENTRESTO) 97-103 MG Take 1 tablet by mouth 2 (two) times daily. 180 tablet 3  . spironolactone (ALDACTONE) 25 MG tablet Take 1 tablet (25 mg total) by mouth daily. 30 tablet 2   No current facility-administered medications for this encounter.     Vitals:   03/11/17 1108  BP: 103/68  Pulse: 67  SpO2: 97%  Weight: (!) 306 lb (138.8 kg)   Wt Readings from Last 3 Encounters:  03/11/17 (!) 306 lb (138.8 kg)  03/05/17 (!) 312 lb 6.4 oz (141.7 kg)  02/19/17 (!) 302 lb (137 kg)    PHYSICAL EXAM: General: NAD, obese Neck: No JVD, no thyromegaly or thyroid nodule.  Lungs: Clear to auscultation bilaterally with normal respiratory effort. CV: Nondisplaced PMI.   Heart regular S1/S2, no S3/S4, no murmur.  No peripheral edema.  No carotid bruit.  Normal pedal pulses.  Abdomen: Soft, nontender, no hepatosplenomegaly, no distention.  Skin: Intact without lesions or rashes.  Neurologic: Alert and oriented x 3.  Psych: Normal affect. Extremities: No clubbing or cyanosis.  HEENT: Normal.   ASSESSMENT & PLAN:  1. Chronic systolic CHF: Had [facility] 0/2334 showing minimal CAD.  Nonischemic cardiomyopathy, possibly tachycardia-mediated in  the setting of rapid atrial fibrillation.   Echo 04/2016 EF 15% but improved to 40-45% on TEE in 7/18. Despite weight gain, he is not volume overloaded on exam (suspect weight gain may be caloric).  NYHA class II symptoms, feels good.   - Continue Entresto 97/103 bid.     - Continue spironolactone 25 mg daily.  - Increase Toprol XL to 75 mg bid.  - Continue Lasix 40 mg daily.  - I will arrange for a repeat echo.  2. Atrial fibrillation: Paroxysmal atrial fibrillation. Now back in NSR after DCCV in 7/18. 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    - Continue amiodarone 200 mg daily.  Recent LFTs and TSH normal, will need regular eye exam.  - Continue apixaban 5 mg bid.   - I would like him considered for atrial fibrillation ablation.  Want to avoid long-term amiodarone, but given suspected tachy-mediated CMP, do not want him to go back into atrial fibrillation.  He missed EP appt due to snow, will refer back to EP.  3. HLD: Continue statin.  4. OSA: Continue CPAP nightly.    Followup in 2 months.   Loralie Champagne 03/11/2017

## 2017-03-11 NOTE — Patient Instructions (Signed)
Increase Metoprolol XL 75 mg (3 tabs), twice a day   You have been referred to EP for Ablation   Your physician has requested that you have an echocardiogram. Echocardiography is a painless test that uses sound waves to create images of your heart. It provides your doctor with information about the size and shape of your heart and how well your heart's chambers and valves are working. This procedure takes approximately one hour. There are no restrictions for this procedure.  Your physician recommends that you schedule a follow-up appointment in: 2 months with Dr. [name] an a echocardiogram

## 2017-03-11 NOTE — Progress Notes (Signed)
Paramedicine Encounter   Patient ID: Darius [name] , male,   DOB: [birth year],61 y.o.,  MRN: [id number]   [name] was seen at the HF clinic today and reported feeling well. [name] ordered an increase in metoprolol from 50 mg BID to 75 mg BID. He also is scheduling an echocardiogram and re-referring him to electrophysiology for an ablation. I made the change in his pillbox immediately after his clinic appointment ended.  Time spent with patient: 45 minutes  Jacquiline Doe, EMT 03/11/2017   ACTION: Next visit planned for 1 week

## 2017-03-12 ENCOUNTER — Other Ambulatory Visit (HOSPITAL_COMMUNITY): Payer: Self-pay | Admitting: *Deleted

## 2017-03-12 MED ORDER — METOPROLOL SUCCINATE ER 50 MG PO TB24: ORAL_TABLET | ORAL | 6 refills | Status: DC

## 2017-03-19 ENCOUNTER — Other Ambulatory Visit (HOSPITAL_COMMUNITY): Payer: Self-pay

## 2017-03-19 NOTE — Progress Notes (Signed)
Paramedicine Encounter    Patient ID: Isabel Freese, male    DOB: 1955-10-23, 62 y.o.   MRN: [id number]   Patient Care Team: Scot Jun, FNP as PCP - General (Family Medicine) [name], MD as Consulting Physician (Cardiology)  Patient Active Problem List   Diagnosis Date Noted  . Atrial fibrillation with RVR ([place]) 04/17/2016  . Elevated troponin 04/17/2016  . CHF (congestive heart failure) ([place]) 04/17/2016  . Septic olecranon bursitis of right elbow 09/19/2015  . Lactic acid acidosis   . Dilated cardiomyopathy ([place]) 04/19/2014  . Type 2 diabetes mellitus ([place]) 04/09/2014  . Non compliance with medical treatment and diet 04/09/2014  . Chronic anticoagulation-Eliquis started 04/06/14 04/09/2014  . Abnormal chest CT-LUL nodule- needs f/u May 2016 04/09/2014  . Acute on chronic combined systolic and diastolic heart failure ([place]) 04/05/2014  . OSA - C-pap 03/22/2013  . Persistent atrial fibrillation ([place]) 03/27/2011  . Obesity, Class III, BMI 40-49.9 (morbid obesity) ([place]) 03/27/2011  . URI, acute 03/27/2011    Current Outpatient Medications:  .  amiodarone (PACERONE) 200 MG tablet, TAKE 1 TABLET BY MOUTH ONCE DAILY, Disp: 34 tablet, Rfl: 2 .  apixaban (ELIQUIS) 5 MG TABS tablet, Take 1 tablet (5 mg total) by mouth 2 (two) times daily., Disp: 180 tablet, Rfl: 3 .  atorvastatin (LIPITOR) 80 MG tablet, TAKE 1 TABLET (80 MG TOTAL) BY MOUTH AT BEDTIME., Disp: 30 tablet, Rfl: 11 .  furosemide (LASIX) 40 MG tablet, Take 1 tablet (40 mg total) by mouth daily., Disp: 30 tablet, Rfl: 4 .  metFORMIN (GLUCOPHAGE) 1000 MG tablet, TAKE 1 TABLET BY MOUTH TWICE A DAY WITH FOOD, Disp: 60 tablet, Rfl: 3 .  metoprolol succinate (TOPROL-XL) 50 MG 24 hr tablet, Take 1.5 tabs Twice daily Take with or immediately following a meal., Disp: 90 tablet, Rfl: 6 .  sacubitril-valsartan (ENTRESTO) 97-103 MG, Take 1 tablet by mouth 2 (two) times daily., Disp: 180 tablet, Rfl: 3 .  spironolactone  (ALDACTONE) 25 MG tablet, Take 1 tablet (25 mg total) by mouth daily., Disp: 30 tablet, Rfl: 2 .  Krill Oil (OMEGA-3) 500 MG CAPS, Take 1 capsule by mouth daily., Disp: , Rfl:  No Known Allergies    Social History   Socioeconomic History  . Marital status: Single    Spouse name: Not on file  . Number of children: Not on file  . Years of education: Not on file  . Highest education level: Not on file  Social Needs  . Financial resource strain: Not on file  . Food insecurity - worry: Not on file  . Food insecurity - inability: Not on file  . Transportation needs - medical: Not on file  . Transportation needs - non-medical: Not on file  Occupational History  . Occupation: unemployeed  Tobacco Use  . Smoking status: Never Smoker  . Smokeless tobacco: Never Used  Substance and Sexual Activity  . Alcohol use: Yes    Comment: some  . Drug use: No  . Sexual activity: No  Other Topics Concern  . Not on file  Social History Narrative   Admits that he eats poorly and eats a lot at work.      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    Physical Exam  Constitutional: He is oriented to person, place, and time.  Cardiovascular: Normal rate and regular rhythm.  Pulmonary/Chest: Effort normal and breath sounds normal. No respiratory distress. He has no wheezes. He has no  rales.  Abdominal: Soft.  Musculoskeletal: Normal range of motion. He exhibits edema.  Neurological: He is alert and oriented to person, place, and time.  Skin: Skin is warm and dry.  Psychiatric: He has a normal mood and affect.        Future Appointments  Date Time Provider [place]  05/07/2017 11:00 AM MC ECHO 1-BUZZ MC-ECHOLAB [facility]  05/07/2017 12:00 PM [name], MD MC-HVSC None  05/15/2017  2:30 PM Scot Jun, FNP SCC-SCC None    BP 110/74 (BP Location: Right Arm, Patient Position: Sitting, Cuff Size: Large)   Pulse 68   Resp 18   Wt (!) 303 lb 12.8 oz (137.8  kg)   SpO2 97%   BMI 39.01 kg/m   Weight yesterday- 304 lb Last visit weight- 312 lb  Mr Kenner was seen at [facility] today. He reported feeling well and and reported being compliant with his medications. He denied SOB, headache, dizziness or orthopnea. His medications were verified and his pillbox was refilled.   Time spent with patient: 33 minutes  Jacquiline Doe, EMT 03/19/17  ACTION: Home visit completed Next visit planned for 2 weeks

## 2017-04-02 ENCOUNTER — Telehealth (HOSPITAL_COMMUNITY): Payer: Self-pay

## 2017-04-02 NOTE — Telephone Encounter (Signed)
Darius [name] called me to say he was not going to be able to make our standing appointment for 15:00 today due to car trouble. He said he had his medications and would be able to fill his pillbox and would call me next week to reschedule.

## 2017-04-08 MED FILL — SPIRONOLACTONE 25 MG TABLET: 25 | 30 days supply | Qty: 30 | Fill #5

## 2017-04-08 MED FILL — metFORMIN HCL 1000 MG TABS: 1000 | 30 days supply | Qty: 60 | Fill #1

## 2017-04-08 MED FILL — AMIODARONE HCL 200 MG TAB: 200 | 30 days supply | Qty: 30 | Fill #0

## 2017-04-08 MED FILL — METOPROLOL SUCCINATE ER 50: 50 | 30 days supply | Qty: 90 | Fill #0

## 2017-04-08 MED FILL — ATORVASTATIN 80 MG TABLET: 80 | 30 days supply | Qty: 30 | Fill #2

## 2017-04-08 MED FILL — FUROSEMIDE 40 MG TAB: 40 | 34 days supply | Qty: 34 | Fill #0

## 2017-04-10 ENCOUNTER — Other Ambulatory Visit (HOSPITAL_COMMUNITY): Payer: Self-pay

## 2017-04-10 NOTE — Progress Notes (Signed)
Paramedicine Encounter    Patient ID: Darius [name], male    DOB: [birth year], 62 y.o.   MRN: [id number]   Patient Care Team: Scot Jun, FNP as PCP - General (Family Medicine) [name], MD as Consulting Physician (Cardiology)  Patient Active Problem List   Diagnosis Date Noted  . Atrial fibrillation with RVR ([place]) 04/17/2016  . Elevated troponin 04/17/2016  . CHF (congestive heart failure) ([place]) 04/17/2016  . Septic olecranon bursitis of right elbow 09/19/2015  . Lactic acid acidosis   . Dilated cardiomyopathy ([place]) 04/19/2014  . Type 2 diabetes mellitus ([place]) 04/09/2014  . Non compliance with medical treatment and diet 04/09/2014  . Chronic anticoagulation-Eliquis started 04/06/14 04/09/2014  . Abnormal chest CT-LUL nodule- needs f/u May 2016 04/09/2014  . Acute on chronic combined systolic and diastolic heart failure ([place]) 04/05/2014  . OSA - C-pap 03/22/2013  . Persistent atrial fibrillation ([place]) 03/27/2011  . Obesity, Class III, BMI 40-49.9 (morbid obesity) ([place]) 03/27/2011  . URI, acute 03/27/2011    Current Outpatient Medications:  .  amiodarone (PACERONE) 200 MG tablet, TAKE 1 TABLET BY MOUTH ONCE DAILY, Disp: 34 tablet, Rfl: 2 .  apixaban (ELIQUIS) 5 MG TABS tablet, Take 1 tablet (5 mg total) by mouth 2 (two) times daily., Disp: 180 tablet, Rfl: 3 .  atorvastatin (LIPITOR) 80 MG tablet, TAKE 1 TABLET (80 MG TOTAL) BY MOUTH AT BEDTIME., Disp: 30 tablet, Rfl: 11 .  furosemide (LASIX) 40 MG tablet, Take 1 tablet (40 mg total) by mouth daily., Disp: 30 tablet, Rfl: 4 .  metFORMIN (GLUCOPHAGE) 1000 MG tablet, TAKE 1 TABLET BY MOUTH TWICE A DAY WITH FOOD, Disp: 60 tablet, Rfl: 3 .  metoprolol succinate (TOPROL-XL) 50 MG 24 hr tablet, Take 1.5 tabs Twice daily Take with or immediately following a meal., Disp: 90 tablet, Rfl: 6 .  sacubitril-valsartan (ENTRESTO) 97-103 MG, Take 1 tablet by mouth 2 (two) times daily., Disp: 180 tablet, Rfl: 3 .  spironolactone  (ALDACTONE) 25 MG tablet, Take 1 tablet (25 mg total) by mouth daily., Disp: 30 tablet, Rfl: 2 .  Krill Oil (OMEGA-3) 500 MG CAPS, Take 1 capsule by mouth daily., Disp: , Rfl:  No Known Allergies    Social History   Socioeconomic History  . Marital status: Single    Spouse name: Not on file  . Number of children: Not on file  . Years of education: Not on file  . Highest education level: Not on file  Social Needs  . Financial resource strain: Not on file  . Food insecurity - worry: Not on file  . Food insecurity - inability: Not on file  . Transportation needs - medical: Not on file  . Transportation needs - non-medical: Not on file  Occupational History  . Occupation: unemployeed  Tobacco Use  . Smoking status: Never Smoker  . Smokeless tobacco: Never Used  Substance and Sexual Activity  . Alcohol use: Yes    Comment: some  . Drug use: No  . Sexual activity: No  Other Topics Concern  . Not on file  Social History Narrative   Admits that he eats poorly and eats a lot at work.      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    Physical Exam  Constitutional: He is oriented to person, place, and time.  Cardiovascular: Normal rate and regular rhythm.  Pulmonary/Chest: Effort normal and breath sounds normal. No respiratory distress. He has no wheezes. He has no  rales.  Musculoskeletal: Normal range of motion. He exhibits edema.  Neurological: He is alert and oriented to person, place, and time.  Skin: Skin is warm and dry.  Psychiatric: He has a normal mood and affect.        Future Appointments  Date Time Provider [place]  05/07/2017 11:00 AM MC ECHO 1-BUZZ [facility]-ECHOLAB [facility]  05/07/2017 12:00 PM [name], MD MC-HVSC None  05/15/2017  2:30 PM Scot Jun, FNP SCC-SCC None    BP 118/70 (BP Location: Left Arm, Patient Position: Sitting, Cuff Size: Large)   Pulse 66   Resp 18   Wt (!) 311 lb (141.1 kg)   SpO2 96%   BMI 39.93  kg/m   Weight yesterday- 311 lb Last visit weight- 303 lb  [name] was seen at the [facility] today and reported feeling well. He exhibits minor lower extremity edema but denied SOB, headache, dizziness or orthopnea. His weight has increased 8 pounds since our last meeting and reports that he is still not following a low sodium diet. Specifically he mentioned eating "pork skins with potted meat" I have provided multiple teaching sessions but he is not responsive to my efforts. I will seek further assistance from the clinic and my peers. Moving forward I do not know if I can make any further progress with him.  Time spent with patient: 38 minutes  Jacquiline Doe, EMT 04/10/17  ACTION: Home visit completed Next visit planned for 2 weeks

## 2017-04-23 ENCOUNTER — Other Ambulatory Visit (HOSPITAL_COMMUNITY): Payer: Self-pay

## 2017-04-23 NOTE — Progress Notes (Signed)
Paramedicine Encounter    Patient ID: Darius [name], male    DOB: [birth year], 61 y.o.   MRN: [id number]   Patient Care Team: Scot Jun, FNP as PCP - General (Family Medicine) [name], MD as Consulting Physician (Cardiology)  Patient Active Problem List   Diagnosis Date Noted  . Atrial fibrillation with RVR ([place]) 04/17/2016  . Elevated troponin 04/17/2016  . CHF (congestive heart failure) ([place]) 04/17/2016  . Septic olecranon bursitis of right elbow 09/19/2015  . Lactic acid acidosis   . Dilated cardiomyopathy ([place]) 04/19/2014  . Type 2 diabetes mellitus ([place]) 04/09/2014  . Non compliance with medical treatment and diet 04/09/2014  . Chronic anticoagulation-Eliquis started 04/06/14 04/09/2014  . Abnormal chest CT-LUL nodule- needs f/u May 2016 04/09/2014  . Acute on chronic combined systolic and diastolic heart failure ([place]) 04/05/2014  . OSA - C-pap 03/22/2013  . Persistent atrial fibrillation ([place]) 03/27/2011  . Obesity, Class III, BMI 40-49.9 (morbid obesity) ([place]) 03/27/2011  . URI, acute 03/27/2011    Current Outpatient Medications:  .  amiodarone (PACERONE) 200 MG tablet, TAKE 1 TABLET BY MOUTH ONCE DAILY, Disp: 34 tablet, Rfl: 2 .  apixaban (ELIQUIS) 5 MG TABS tablet, Take 1 tablet (5 mg total) by mouth 2 (two) times daily., Disp: 180 tablet, Rfl: 3 .  atorvastatin (LIPITOR) 80 MG tablet, TAKE 1 TABLET (80 MG TOTAL) BY MOUTH AT BEDTIME., Disp: 30 tablet, Rfl: 11 .  furosemide (LASIX) 40 MG tablet, Take 1 tablet (40 mg total) by mouth daily., Disp: 30 tablet, Rfl: 4 .  metFORMIN (GLUCOPHAGE) 1000 MG tablet, TAKE 1 TABLET BY MOUTH TWICE A DAY WITH FOOD, Disp: 60 tablet, Rfl: 3 .  metoprolol succinate (TOPROL-XL) 50 MG 24 hr tablet, Take 1.5 tabs Twice daily Take with or immediately following a meal., Disp: 90 tablet, Rfl: 6 .  sacubitril-valsartan (ENTRESTO) 97-103 MG, Take 1 tablet by mouth 2 (two) times daily., Disp: 180 tablet, Rfl: 3 .  Krill Oil (OMEGA-3)  500 MG CAPS, Take 1 capsule by mouth daily., Disp: , Rfl:  .  spironolactone (ALDACTONE) 25 MG tablet, Take 1 tablet (25 mg total) by mouth daily., Disp: 30 tablet, Rfl: 2 No Known Allergies    Social History   Socioeconomic History  . Marital status: Single    Spouse name: Not on file  . Number of children: Not on file  . Years of education: Not on file  . Highest education level: Not on file  Social Needs  . Financial resource strain: Not on file  . Food insecurity - worry: Not on file  . Food insecurity - inability: Not on file  . Transportation needs - medical: Not on file  . Transportation needs - non-medical: Not on file  Occupational History  . Occupation: unemployeed  Tobacco Use  . Smoking status: Never Smoker  . Smokeless tobacco: Never Used  Substance and Sexual Activity  . Alcohol use: Yes    Comment: some  . Drug use: No  . Sexual activity: No  Other Topics Concern  . Not on file  Social History Narrative   Admits that he eats poorly and eats a lot at work.      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    Physical Exam  Constitutional: He is oriented to person, place, and time.  Cardiovascular: Normal rate and regular rhythm.  Pulmonary/Chest: Effort normal and breath sounds normal. No respiratory distress. He has no wheezes. He has no  rales.  Musculoskeletal: Normal range of motion. He exhibits no edema.  Neurological: He is alert and oriented to person, place, and time.  Skin: Skin is warm and dry.  Psychiatric: He has a normal mood and affect.        Future Appointments  Date Time Provider [place]  05/07/2017 11:00 AM MC ECHO 1-BUZZ MC-ECHOLAB [facility]  05/07/2017 12:00 PM [name], MD MC-HVSC None  05/15/2017  2:30 PM Scot Jun, FNP SCC-SCC None    BP 108/62 (BP Location: Left Arm, Patient Position: Sitting, Cuff Size: Large)   Pulse 69   Resp 18   Wt (!) 303 lb (137.4 kg)   SpO2 96%   BMI 38.90  kg/m   Weight yesterday- 306 lb Last visit weight- 311 lb  Darius [name] was seen at [facility] today and reported feeling well. He is still not compliant with his diet or fluid restrictions. He is eating most of his meals via fast food and when he does cook at home he is using packaged steak seasoning or salt. I have been to the grocery store with him and explained how he should be reading labels but he is not changing his habits. He did not have entresto to fil his pillbox because it had not been delivered despite being ordered at our last meeting . I spoke with him about this earlier in the week via telephone and asked he call the pharmacy to ensure it was there when we meet. He stated he called but did not get through to anyone and did not try again. I am unclear on what steps to take next but I will discuss this at the next team meeting.   Time spent with patient: 37 minutes  Jacquiline Doe, EMT 04/23/17  ACTION: Home visit completed Next visit planned for 2 weeks

## 2017-05-07 ENCOUNTER — Encounter (HOSPITAL_COMMUNITY): Payer: Self-pay | Admitting: Cardiology

## 2017-05-07 ENCOUNTER — Ambulatory Visit (HOSPITAL_COMMUNITY): Payer: Self-pay

## 2017-05-07 ENCOUNTER — Other Ambulatory Visit (HOSPITAL_COMMUNITY): Payer: Self-pay

## 2017-05-07 NOTE — Progress Notes (Signed)
Paramedicine Encounter    Patient ID: Darius [name], male    DOB: 1955-06-16, 62 y.o.   MRN: [id number]   Patient Care Team: Darius Jun, FNP as PCP - General (Family Medicine) Darius Dresser, MD as Consulting Physician (Cardiology)  Patient Active Problem List   Diagnosis Date Noted  . Atrial fibrillation with RVR ([place]) 04/17/2016  . Elevated troponin 04/17/2016  . CHF (congestive heart failure) ([place]) 04/17/2016  . Septic olecranon bursitis of right elbow 09/19/2015  . Lactic acid acidosis   . Dilated cardiomyopathy ([place]) 04/19/2014  . Type 2 diabetes mellitus ([place]) 04/09/2014  . Non compliance with medical treatment and diet 04/09/2014  . Chronic anticoagulation-Eliquis started 04/06/14 04/09/2014  . Abnormal chest CT-LUL nodule- needs f/u May 2016 04/09/2014  . Acute on chronic combined systolic and diastolic heart failure ([place]) 04/05/2014  . OSA - C-pap 03/22/2013  . Persistent atrial fibrillation ([place]) 03/27/2011  . Obesity, Class III, BMI 40-49.9 (morbid obesity) ([place]) 03/27/2011  . URI, acute 03/27/2011    Current Outpatient Medications:  .  amiodarone (PACERONE) 200 MG tablet, TAKE 1 TABLET BY MOUTH ONCE DAILY, Disp: 34 tablet, Rfl: 2 .  apixaban (ELIQUIS) 5 MG TABS tablet, Take 1 tablet (5 mg total) by mouth 2 (two) times daily., Disp: 180 tablet, Rfl: 3 .  atorvastatin (LIPITOR) 80 MG tablet, TAKE 1 TABLET (80 MG TOTAL) BY MOUTH AT BEDTIME., Disp: 30 tablet, Rfl: 11 .  furosemide (LASIX) 40 MG tablet, Take 1 tablet (40 mg total) by mouth daily., Disp: 30 tablet, Rfl: 4 .  Krill Oil (OMEGA-3) 500 MG CAPS, Take 1 capsule by mouth daily., Disp: , Rfl:  .  metFORMIN (GLUCOPHAGE) 1000 MG tablet, TAKE 1 TABLET BY MOUTH TWICE A DAY WITH FOOD, Disp: 60 tablet, Rfl: 3 .  metoprolol succinate (TOPROL-XL) 50 MG 24 hr tablet, Take 1.5 tabs Twice daily Take with or immediately following a meal., Disp: 90 tablet, Rfl: 6 .  sacubitril-valsartan (ENTRESTO) 97-103 MG, Take 1  tablet by mouth 2 (two) times daily., Disp: 180 tablet, Rfl: 3 .  spironolactone (ALDACTONE) 25 MG tablet, Take 1 tablet (25 mg total) by mouth daily., Disp: 30 tablet, Rfl: 2 No Known Allergies    Social History   Socioeconomic History  . Marital status: Single    Spouse name: Not on file  . Number of children: Not on file  . Years of education: Not on file  . Highest education level: Not on file  Occupational History  . Occupation: unemployeed  Social Needs  . Financial resource strain: Not on file  . Food insecurity:    Worry: Not on file    Inability: Not on file  . Transportation needs:    Medical: Not on file    Non-medical: Not on file  Tobacco Use  . Smoking status: Never Smoker  . Smokeless tobacco: Never Used  Substance and Sexual Activity  . Alcohol use: Yes    Comment: some  . Drug use: No  . Sexual activity: Never  Lifestyle  . Physical activity:    Days per week: Not on file    Minutes per session: Not on file  . Stress: Not on file  Relationships  . Social connections:    Talks on phone: Not on file    Gets together: Not on file    Attends religious service: Not on file    Active member of club or organization: Not on file    Attends meetings of  clubs or organizations: Not on file    Relationship status: Not on file  . Intimate partner violence:    Fear of current or ex partner: Not on file    Emotionally abused: Not on file    Physically abused: Not on file    Forced sexual activity: Not on file  Other Topics Concern  . Not on file  Social History Narrative   Admits that he eats poorly and eats a lot at work.      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    Physical Exam  Cardiovascular: Normal rate and regular rhythm.  Pulmonary/Chest: Effort normal and breath sounds normal. No respiratory distress. He has no wheezes. He has no rales.  Musculoskeletal: Normal range of motion. He exhibits no edema.  Skin: Skin is warm  and dry.  Psychiatric: He has a normal mood and affect.        Future Appointments  Date Time Provider [place]  05/20/2017  2:40 PM Darius Dew, FNP [facility] None  07/17/2017  1:00 PM [place] ECHO 1-BUZZ [facility]ECHOL[facility]  07/17/2017  2:00 PM Darius Dresser, MD [facility]-HV[facility] None    There were no vitals taken for this visit.  Weight yesterday- "can not remember" Last visit weight- 303 lb  Darius [name] was seen at the [facility] today. He reported feeling well and denied any episodes of SOB, headaches, dizziness or orthopnea over the past two weeks. He was supposed to have a clinic appointment today however he missed his echo so it had to be rescheduled. He reports being compliant with his medications and denied missing any doses. Amiodarone, lasix and metoprolol were ordered from the pharmacy and he stated he would be able to pick it up.   Time spent with the patient: 33 minutes  Jacquiline Doe, EMT 05/07/17  ACTION: Next visit planned for 2 weeks

## 2017-05-15 ENCOUNTER — Ambulatory Visit: Payer: Self-pay | Admitting: Family Medicine

## 2017-05-18 ENCOUNTER — Encounter (HOSPITAL_COMMUNITY): Payer: Self-pay | Admitting: *Deleted

## 2017-05-18 NOTE — Progress Notes (Signed)
Received medical records request from [facility], CASE # [id number].  Requested records faxed today to [phone].  Original request will be scanned to patient's electronic medical record.

## 2017-05-19 MED FILL — METOPROLOL SUCCINATE ER 50: 50 | 30 days supply | Qty: 90 | Fill #1

## 2017-05-19 MED FILL — metFORMIN HCL 1000 MG TABS: 1000 | 30 days supply | Qty: 60 | Fill #2

## 2017-05-19 MED FILL — ATORVASTATIN 80 MG TABLET: 80 | 30 days supply | Qty: 30 | Fill #3

## 2017-05-19 MED FILL — AMIODARONE HCL 200 MG TAB: 200 | 30 days supply | Qty: 30 | Fill #1

## 2017-05-19 MED FILL — SPIRONOLACTONE 25 MG TABLET: 25 | 30 days supply | Qty: 30 | Fill #6

## 2017-05-19 MED FILL — FUROSEMIDE 40 MG TAB: 40 | 34 days supply | Qty: 34 | Fill #1

## 2017-05-20 ENCOUNTER — Encounter: Payer: Self-pay | Admitting: Family Medicine

## 2017-05-20 ENCOUNTER — Other Ambulatory Visit (HOSPITAL_COMMUNITY): Payer: Self-pay

## 2017-05-20 ENCOUNTER — Ambulatory Visit (INDEPENDENT_AMBULATORY_CARE_PROVIDER_SITE_OTHER): Payer: Self-pay | Admitting: Family Medicine

## 2017-05-20 VITALS — BP 86/54 | HR 76 | Temp 98.3°F | Resp 16 | Ht 74.0 in | Wt 301.0 lb

## 2017-05-20 DIAGNOSIS — I481 Persistent atrial fibrillation: Secondary | ICD-10-CM

## 2017-05-20 DIAGNOSIS — I4819 Other persistent atrial fibrillation: Secondary | ICD-10-CM

## 2017-05-20 DIAGNOSIS — R82998 Other abnormal findings in urine: Secondary | ICD-10-CM

## 2017-05-20 DIAGNOSIS — G4733 Obstructive sleep apnea (adult) (pediatric): Secondary | ICD-10-CM

## 2017-05-20 DIAGNOSIS — I509 Heart failure, unspecified: Secondary | ICD-10-CM

## 2017-05-20 DIAGNOSIS — E118 Type 2 diabetes mellitus with unspecified complications: Secondary | ICD-10-CM

## 2017-05-20 DIAGNOSIS — E669 Obesity, unspecified: Secondary | ICD-10-CM

## 2017-05-20 LAB — POCT URINALYSIS DIPSTICK
Bilirubin, UA: NEGATIVE
Glucose, UA: NEGATIVE
KETONES UA: NEGATIVE
NITRITE UA: NEGATIVE
PH UA: 5.5 (ref 5.0–8.0)
PROTEIN UA: 100
SPEC GRAV UA: 1.025 (ref 1.010–1.025)
UROBILINOGEN UA: 0.2 U/dL

## 2017-05-20 LAB — POCT GLYCOSYLATED HEMOGLOBIN (HGB A1C): HEMOGLOBIN A1C: 6.5

## 2017-05-20 NOTE — Progress Notes (Signed)
63

## 2017-05-20 NOTE — Progress Notes (Signed)
Paramedicine Encounter    Patient ID: Timothey Dahlstrom, male    DOB: 1955/10/23, 62 y.o.   MRN: [id number]   Patient Care Team: Scot Jun, FNP as PCP - General (Family Medicine) [name], MD as Consulting Physician (Cardiology)  Patient Active Problem List   Diagnosis Date Noted  . Atrial fibrillation with RVR ([place]) 04/17/2016  . Elevated troponin 04/17/2016  . CHF (congestive heart failure) ([place]) 04/17/2016  . Septic olecranon bursitis of right elbow 09/19/2015  . Lactic acid acidosis   . Dilated cardiomyopathy ([place]) 04/19/2014  . Type 2 diabetes mellitus ([place]) 04/09/2014  . Non compliance with medical treatment and diet 04/09/2014  . Chronic anticoagulation-Eliquis started 04/06/14 04/09/2014  . Abnormal chest CT-LUL nodule- needs f/u May 2016 04/09/2014  . Acute on chronic combined systolic and diastolic heart failure ([place]) 04/05/2014  . OSA - C-pap 03/22/2013  . Persistent atrial fibrillation ([place]) 03/27/2011  . Obesity, Class III, BMI 40-49.9 (morbid obesity) ([place]) 03/27/2011  . URI, acute 03/27/2011    Current Outpatient Medications:  .  amiodarone (PACERONE) 200 MG tablet, TAKE 1 TABLET BY MOUTH ONCE DAILY, Disp: 34 tablet, Rfl: 2 .  apixaban (ELIQUIS) 5 MG TABS tablet, Take 1 tablet (5 mg total) by mouth 2 (two) times daily., Disp: 180 tablet, Rfl: 3 .  atorvastatin (LIPITOR) 80 MG tablet, TAKE 1 TABLET (80 MG TOTAL) BY MOUTH AT BEDTIME., Disp: 30 tablet, Rfl: 11 .  furosemide (LASIX) 40 MG tablet, Take 1 tablet (40 mg total) by mouth daily., Disp: 30 tablet, Rfl: 4 .  Krill Oil (OMEGA-3) 500 MG CAPS, Take 1 capsule by mouth daily., Disp: , Rfl:  .  metFORMIN (GLUCOPHAGE) 1000 MG tablet, TAKE 1 TABLET BY MOUTH TWICE A DAY WITH FOOD, Disp: 60 tablet, Rfl: 3 .  metoprolol succinate (TOPROL-XL) 50 MG 24 hr tablet, Take 1.5 tabs Twice daily Take with or immediately following a meal., Disp: 90 tablet, Rfl: 6 .  sacubitril-valsartan (ENTRESTO) 97-103 MG, Take 1  tablet by mouth 2 (two) times daily., Disp: 180 tablet, Rfl: 3 .  spironolactone (ALDACTONE) 25 MG tablet, Take 1 tablet (25 mg total) by mouth daily., Disp: 30 tablet, Rfl: 2 No Known Allergies    Social History   Socioeconomic History  . Marital status: Single    Spouse name: Not on file  . Number of children: Not on file  . Years of education: Not on file  . Highest education level: Not on file  Occupational History  . Occupation: unemployeed  Social Needs  . Financial resource strain: Not on file  . Food insecurity:    Worry: Not on file    Inability: Not on file  . Transportation needs:    Medical: Not on file    Non-medical: Not on file  Tobacco Use  . Smoking status: Never Smoker  . Smokeless tobacco: Never Used  Substance and Sexual Activity  . Alcohol use: Yes    Comment: some  . Drug use: No  . Sexual activity: Never  Lifestyle  . Physical activity:    Days per week: Not on file    Minutes per session: Not on file  . Stress: Not on file  Relationships  . Social connections:    Talks on phone: Not on file    Gets together: Not on file    Attends religious service: Not on file    Active member of club or organization: Not on file    Attends meetings of  clubs or organizations: Not on file    Relationship status: Not on file  . Intimate partner violence:    Fear of current or ex partner: Not on file    Emotionally abused: Not on file    Physically abused: Not on file    Forced sexual activity: Not on file  Other Topics Concern  . Not on file  Social History Narrative   Admits that he eats poorly and eats a lot at work.      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    Physical Exam  Constitutional: He is oriented to person, place, and time.  Cardiovascular: Normal rate and regular rhythm.  Pulmonary/Chest: Effort normal and breath sounds normal.  Abdominal: Soft.  Musculoskeletal: Normal range of motion. He exhibits edema.   Neurological: He is alert and oriented to person, place, and time.  Skin: Skin is warm and dry.  Psychiatric: He has a normal mood and affect.        Future Appointments  Date Time Provider [place]  07/17/2017  1:00 PM [facility] ECHO 1-BUZZ [facility]  07/17/2017  2:00 PM [name], MD MC-HVSC None  08/19/2017  1:40 PM Dorena Dew, FNP [facility]-SCC None    There were no vitals taken for this visit.  Weight yesterday- 298.2 lb Last visit weight- 302 lb  Mr Printy was seen at [facility] today and reported feeling well. He had missed several doses of medicine over the past two weeks, mostly evening doses. He said this is because he doesn't taken them before he starts watching TV in bed and then he forgets and falls asleep. I stressed the importance taking his medications everyday as prescribed. He is also not eating a heart healthy diet and says it is because when he is hungry, he wants to eat then not wait an hour for food to be prepared and if he were to cook he would have to clean out his kitchen which I assume is a task that would take quite some time. His medications were verified and his pillbox was refilled.   Time spent with patient: 44 minutes  Jacquiline Doe, EMT 05/20/17  ACTION: Home visit completed Next visit planned for 1 week

## 2017-05-21 LAB — BASIC METABOLIC PANEL
BUN/Creatinine Ratio: 15 (ref 10–24)
BUN: 20 mg/dL (ref 8–27)
CALCIUM: 9.6 mg/dL (ref 8.6–10.2)
CHLORIDE: 101 mmol/L (ref 96–106)
CO2: 23 mmol/L (ref 20–29)
CREATININE: 1.35 mg/dL — AB (ref 0.76–1.27)
GFR calc Af Amer: 64 mL/min/{1.73_m2} (ref >59)
GFR calc non Af Amer: 55 mL/min/{1.73_m2} — ABNORMAL LOW (ref >59)
GLUCOSE: 148 mg/dL — AB (ref 65–99)
Potassium: 4.5 mmol/L (ref 3.5–5.2)
Sodium: 138 mmol/L (ref 134–144)

## 2017-05-22 LAB — URINE CULTURE

## 2017-05-23 ENCOUNTER — Other Ambulatory Visit: Payer: Self-pay | Admitting: Family Medicine

## 2017-05-23 DIAGNOSIS — R7989 Other specified abnormal findings of blood chemistry: Secondary | ICD-10-CM

## 2017-05-23 NOTE — Progress Notes (Signed)
Orders Placed This Encounter  Procedures  . Basic Metabolic Panel (BMET)    Standing Status:   Future    Standing Expiration Date:   05/24/2018    Donia Pounds  MSN, FNP-C Patient [facility] [address] [place], [place] [address] [phone]

## 2017-05-24 DIAGNOSIS — R82998 Other abnormal findings in urine: Secondary | ICD-10-CM | POA: Insufficient documentation

## 2017-05-24 NOTE — Progress Notes (Signed)
Chief Complaint  Patient presents with  . Diabetes     Subjective:    Patient ID: Darius [name], male    DOB: 09/12/1955, 62 y.o.   MRN: [id number]  HPI Darius [name],  62 year old male with a history of atrial fibrillation, type 2 diabetes, morbid obesity and hypertension presents for a 3 month follow up of chronic conditions.  Patient is on chronic anticoagulation therapy for atrial fibrillation. [name] has been taking Eliquis consistently. He is followed by Dr. [name], cardiologist, consistently for heart failure. Patient is followed closely by heart failure clinic and receives routine home visits for vital sign checks and medication adherence. Patient denies any signs of bleeding and is tolerating anticoagulation therapy well.  Patient has a history of obesity. Body mass index is 38.65 kg/m. He does not exercise routinely, but has started to follow a balanced diet. Patient endorses periodic shortness of breath and is not on home oxygen. He says that shortness of breath occurs with physical activity.  Darius [name] also has a history of type 2 diabetes mellitus. He has been taking metformin 1000 mg twice daily. He denies dizziness, paresthesias, diapheresis, nausea, weight loss, polyuria, or polyphagia. Patient endorses frequent thirst,which he attributes to diuretics. Most recent hemoglobin a1C is 7.5 and medication was increased at last primary care appointment.   Past Medical History:  Diagnosis Date  . Atrial fibrillation ([place])    unable to tolerate versed and fentanyl sedation and required gen anesthesia for TEE;  s/p TEE-DCCV (failed);  Amiodarone started  . Chronic diastolic heart failure ([place])    a. Echo 03/27/11: EF 50-55%, inferior hypokinesis, moderate LAE, mild RVE, normal pulmonary pressures. ;   b.  TEE (04/03/11): EF 40-45%, mild MR moderate LAE, no LAA clot, mild RAE;  c.  Echocardiogram (02/2013): EF 60-65%, Gr 2 DD, mildly dilated Ao root (Ao root dimension 39 mm), MAC, mild LAE,  normal RVF  . Diabetes mellitus, type 2 ([place])   . Hx of cardiovascular stress test    Lexiscan Myoview (02/2013): No ischemia or scar, EF 51%, low risk  . Morbid obesity ([place])   . Sleep apnea    a. sleep test 01/2013:  very severy OSA   Social History   Socioeconomic History  . Marital status: Single    Spouse name: Not on file  . Number of children: Not on file  . Years of education: Not on file  . Highest education level: Not on file  Occupational History  . Occupation: unemployeed  Social Needs  . Financial resource strain: Not on file  . Food insecurity:    Worry: Not on file    Inability: Not on file  . Transportation needs:    Medical: Not on file    Non-medical: Not on file  Tobacco Use  . Smoking status: Never Smoker  . Smokeless tobacco: Never Used  Substance and Sexual Activity  . Alcohol use: Yes    Comment: some  . Drug use: No  . Sexual activity: Never  Lifestyle  . Physical activity:    Days per week: Not on file    Minutes per session: Not on file  . Stress: Not on file  Relationships  . Social connections:    Talks on phone: Not on file    Gets together: Not on file    Attends religious service: Not on file    Active member of club or organization: Not on file    Attends meetings of clubs or  organizations: Not on file    Relationship status: Not on file  . Intimate partner violence:    Fear of current or ex partner: Not on file    Emotionally abused: Not on file    Physically abused: Not on file    Forced sexual activity: Not on file  Other Topics Concern  . Not on file  Social History Narrative   Admits that he eats poorly and eats a lot at work.      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   Immunization History  Administered Date(s) Administered  . Influenza,inj,Quad PF,6+ Mos 11/12/2016  . Pneumococcal Polysaccharide-23 03/27/2011   No Known Allergies Past Surgical History:  Procedure Laterality Date  .  CARDIOVERSION  04/01/2011   Procedure: CARDIOVERSION;  Surgeon: Lelon Perla, MD;  Location: [place];  Service: Cardiovascular;  Laterality: N/A;  . CARDIOVERSION  04/03/2011   Procedure: CARDIOVERSION;  Surgeon: Lelon Perla, MD;  Location: [place];  Service: Cardiovascular;  Laterality: N/A;  . CARDIOVERSION  04/03/2011   Procedure: CARDIOVERSION;  Surgeon: Lelon Perla, MD;  Location: [place];  Service: Cardiovascular;  Laterality: N/A;  . CARDIOVERSION N/A 06/15/2014   Procedure: CARDIOVERSION;  Surgeon: [name], MD;  Location: [facility] ENDOSCOPY;  Service: Cardiovascular;  Laterality: N/A;  . CARDIOVERSION N/A 09/08/2016   Procedure: CARDIOVERSION;  Surgeon: [name], MD;  Location: [facility] ENDOSCOPY;  Service: Cardiovascular;  Laterality: N/A;  . I&D EXTREMITY Right 09/19/2015   Procedure: IRRIGATION AND DEBRIDEMENT EXTREMITY;  Surgeon: Mcarthur Rossetti, MD;  Location: [place];  Service: Orthopedics;  Laterality: Right;  . RIGHT/LEFT HEART CATH AND CORONARY ANGIOGRAPHY N/A 04/21/2016   Procedure: Right/Left Heart Cath and Coronary Angiography;  Surgeon: [name], MD;  Location: [place] CV LAB;  Service: Cardiovascular;  Laterality: N/A;  . TEE WITHOUT CARDIOVERSION  04/01/2011   Procedure: TRANSESOPHAGEAL ECHOCARDIOGRAM (TEE);  Surgeon: Lelon Perla, MD;  Location: [facility] ENDOSCOPY;  Service: Cardiovascular;  Laterality: N/A;  . TEE WITHOUT CARDIOVERSION  04/03/2011   Procedure: TRANSESOPHAGEAL ECHOCARDIOGRAM (TEE);  Surgeon: Lelon Perla, MD;  Location: [facility] ENDOSCOPY;  Service: Cardiovascular;  Laterality: N/A;  . TEE WITHOUT CARDIOVERSION  04/03/2011   Procedure: TRANSESOPHAGEAL ECHOCARDIOGRAM (TEE);  Surgeon: Lelon Perla, MD;  Location: [place];  Service: Cardiovascular;  Laterality: N/A;  . TEE WITHOUT CARDIOVERSION N/A 04/24/2016   Procedure: TRANSESOPHAGEAL ECHOCARDIOGRAM (TEE);  Surgeon: [name], MD;  Location: [place];  Service:  Cardiovascular;  Laterality: N/A;  . TEE WITHOUT CARDIOVERSION N/A 05/26/2016   Procedure: TRANSESOPHAGEAL ECHOCARDIOGRAM (TEE);  Surgeon: [name], MD;  Location: [place];  Service: Cardiovascular;  Laterality: N/A;  . TEE WITHOUT CARDIOVERSION N/A 09/08/2016   Procedure: TRANSESOPHAGEAL ECHOCARDIOGRAM (TEE);  Surgeon: [name], MD;  Location: [facility] ENDOSCOPY;  Service: Cardiovascular;  Laterality: N/A;    Review of Systems  Constitutional: Negative.   HENT: Negative.   Eyes: Negative.   Respiratory: Positive for shortness of breath. Negative for apnea.   Cardiovascular: Negative.   Gastrointestinal: Negative.   Endocrine: Negative.  Negative for polydipsia, polyphagia and polyuria.  Genitourinary: Negative.   Musculoskeletal: Negative.   Skin: Negative.   Neurological: Negative.   Hematological: Negative.   Psychiatric/Behavioral: Negative.        Objective:   Physical Exam  Constitutional:  Morbid obesity  HENT:  Mouth/Throat: Abnormal dentition.  Cardiovascular: Normal rate, regular rhythm, normal heart sounds and intact distal pulses.  Pulmonary/Chest:  Effort normal and breath sounds normal.  Abdominal: Soft. Bowel sounds are normal.  Increased abdominal girth. Abdomen pendulous.   Skin: Skin is warm and dry.  Psychiatric: His behavior is normal. Thought content normal.          Assessment & Plan:  1. Type 2 diabetes mellitus with complication, unspecified whether long term insulin use (HCC) Hemoglobin a1C has improved to 6.5 on Metformin 1000 mg twice daily. Hemoglobin a1C is at goal, no medication changes warranted.  - Urinalysis Dipstick - HgB T7G - Basic Metabolic Panel  2. Urine leukocytes - Urine Culture  3. Persistent atrial fibrillation (HCC) Continue Eliquis as prescribed. Will review serum creatinine level as results become available.   4. Congestive heart failure, unspecified HF chronicity, unspecified heart failure type  [facility]) Patient continues routine home visits biweekly for weight and vital sign checks.  He is on chronic diuretics and antihypertensive medication therapy. Blood pressure has been running lower per patient. Will defer to cardiology for medication adjustments.    5. OSA - C-pap Continue CPAP at bedtime.   6. Obesity Body mass index is 38.65 kg/m.  Recommend a low fat, low carbohydrate diet divided over small meals throughout the day. Also, increase daily activity level as tolerated.    RTC: 3 months for chronic conditions  Donia Pounds  MSN, FNP-C Patient [facility] [address] [place], [place] [address] [phone]

## 2017-05-24 NOTE — Patient Instructions (Signed)
Your hemoglobin a1C is at goal at 6.5. Will continue Metformin 1000 mg every 12 hours with food.  Your A1C goal is less than 7. Your fasting blood sugar  Upon awakening goal is between 110-140.  Your LDL  (bad cholesterol goal is less than 100 Blood pressure goal is <140/90.  Recommend a lowfat, low carbohydrate diet divided over 5-6 small meals, increase water intake to 6-8 glasses, and increase physical activity as tolerated.   Take your medications as prescribed Make sure that you are familiar with each one of your medications and what they are used to treat.  If you are unsure of medications, please bring to follow up  Follow up with heart failure clinic as scheduled   Please keep your scheduled follow up appointment.

## 2017-05-25 ENCOUNTER — Telehealth: Payer: Self-pay

## 2017-05-25 NOTE — Telephone Encounter (Signed)
-----   Message from Dorena Dew, [place] sent at 05/23/2017  7:04 AM EDT ----- Regarding: lab results Please inform patient that serum creatinine ( an indicator of kidney functioning) is mildly elevated. Discuss the importance of following a low fat, low sodium diet. Also, take all medications as prescribed. Will repeat in 2 weeks. Schedule lab appointment.   Donia Pounds  MSN, FNP-C Patient [facility] [address] [place], [place] [address] [phone]

## 2017-05-25 NOTE — Telephone Encounter (Signed)
-----   Message from Dorena Dew, [place] sent at 05/23/2017  7:04 AM EDT ----- Regarding: lab results Please inform patient that serum creatinine ( an indicator of kidney functioning) is mildly elevated. Discuss the importance of following a low fat, low sodium diet. Also, take all medications as prescribed. Will repeat in 2 weeks. Schedule lab appointment.   Donia Pounds  MSN, FNP-C Patient [facility] [address] [place], [place] [address] [phone]

## 2017-05-25 NOTE — Telephone Encounter (Signed)
Patient notified of labs.   

## 2017-06-04 ENCOUNTER — Other Ambulatory Visit (HOSPITAL_COMMUNITY): Payer: Self-pay

## 2017-06-04 NOTE — Progress Notes (Signed)
Paramedicine Encounter    Patient ID: Darius [name], male    DOB: 1955-03-16, 62 y.o.   MRN: [id number]   Patient Care Team: Scot Jun, FNP as PCP - General (Family Medicine) [name], MD as Consulting Physician (Cardiology)  Patient Active Problem List   Diagnosis Date Noted  . Urine leukocytes 05/24/2017  . Atrial fibrillation with RVR ([place]) 04/17/2016  . Elevated troponin 04/17/2016  . CHF (congestive heart failure) ([place]) 04/17/2016  . Septic olecranon bursitis of right elbow 09/19/2015  . Lactic acid acidosis   . Dilated cardiomyopathy ([place]) 04/19/2014  . Type 2 diabetes mellitus ([place]) 04/09/2014  . Non compliance with medical treatment and diet 04/09/2014  . Chronic anticoagulation-Eliquis started 04/06/14 04/09/2014  . Abnormal chest CT-LUL nodule- needs f/u May 2016 04/09/2014  . Acute on chronic combined systolic and diastolic heart failure ([place]) 04/05/2014  . OSA - C-pap 03/22/2013  . Persistent atrial fibrillation ([place]) 03/27/2011  . Obesity, Class III, BMI 40-49.9 (morbid obesity) ([place]) 03/27/2011  . URI, acute 03/27/2011    Current Outpatient Medications:  .  amiodarone (PACERONE) 200 MG tablet, TAKE 1 TABLET BY MOUTH ONCE DAILY, Disp: 34 tablet, Rfl: 2 .  apixaban (ELIQUIS) 5 MG TABS tablet, Take 1 tablet (5 mg total) by mouth 2 (two) times daily., Disp: 180 tablet, Rfl: 3 .  atorvastatin (LIPITOR) 80 MG tablet, TAKE 1 TABLET (80 MG TOTAL) BY MOUTH AT BEDTIME., Disp: 30 tablet, Rfl: 11 .  furosemide (LASIX) 40 MG tablet, Take 1 tablet (40 mg total) by mouth daily., Disp: 30 tablet, Rfl: 4 .  metFORMIN (GLUCOPHAGE) 1000 MG tablet, TAKE 1 TABLET BY MOUTH TWICE A DAY WITH FOOD, Disp: 60 tablet, Rfl: 3 .  metoprolol succinate (TOPROL-XL) 50 MG 24 hr tablet, Take 1.5 tabs Twice daily Take with or immediately following a meal., Disp: 90 tablet, Rfl: 6 .  sacubitril-valsartan (ENTRESTO) 97-103 MG, Take 1 tablet by mouth 2 (two) times daily., Disp: 180 tablet,  Rfl: 3 .  spironolactone (ALDACTONE) 25 MG tablet, Take 1 tablet (25 mg total) by mouth daily., Disp: 30 tablet, Rfl: 2 .  Krill Oil (OMEGA-3) 500 MG CAPS, Take 1 capsule by mouth daily., Disp: , Rfl:  No Known Allergies    Social History   Socioeconomic History  . Marital status: Single    Spouse name: Not on file  . Number of children: Not on file  . Years of education: Not on file  . Highest education level: Not on file  Occupational History  . Occupation: unemployeed  Social Needs  . Financial resource strain: Not on file  . Food insecurity:    Worry: Not on file    Inability: Not on file  . Transportation needs:    Medical: Not on file    Non-medical: Not on file  Tobacco Use  . Smoking status: Never Smoker  . Smokeless tobacco: Never Used  Substance and Sexual Activity  . Alcohol use: Yes    Comment: some  . Drug use: No  . Sexual activity: Never  Lifestyle  . Physical activity:    Days per week: Not on file    Minutes per session: Not on file  . Stress: Not on file  Relationships  . Social connections:    Talks on phone: Not on file    Gets together: Not on file    Attends religious service: Not on file    Active member of club or organization: Not on file  Attends meetings of clubs or organizations: Not on file    Relationship status: Not on file  . Intimate partner violence:    Fear of current or ex partner: Not on file    Emotionally abused: Not on file    Physically abused: Not on file    Forced sexual activity: Not on file  Other Topics Concern  . Not on file  Social History Narrative   Admits that he eats poorly and eats a lot at work.      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    Physical Exam  Constitutional: He is oriented to person, place, and time.  Cardiovascular: Normal rate and regular rhythm.  Pulmonary/Chest: Effort normal and breath sounds normal.  Abdominal: Soft.  Musculoskeletal: Normal range of  motion. He exhibits edema.  Neurological: He is alert and oriented to person, place, and time.  Skin: Skin is warm and dry.  Psychiatric: He has a normal mood and affect.        Future Appointments  Date Time Provider [place]  06/10/2017 10:00 AM [facility] LAB [facility] None  07/17/2017  1:00 PM [place] ECHO 1-BUZZ [facility]  07/17/2017  2:00 PM [name], MD [facility] None  08/19/2017  1:40 PM Dorena Dew, FNP [facility] None    BP 104/68 (BP Location: Left Arm, Patient Position: Sitting, Cuff Size: Large)   Pulse 70   Resp 16   Wt 299 lb 3.2 oz (135.7 kg)   SpO2 95%   BMI 38.42 kg/m   Weight yesterday- 302 lb Last visit weight- 302 lb  [facility]. He reported feeling well despite not following a low sodium diet. He stated his weight is down but he still exhibits lower extremity edema. I asked why he refuses to eat the proper diet and his response was that "if it can't be fixed in the toaster oven or microwave [he] wont make it." This is a constant problem without a clear fix. His medications were verified and his pillbox was refilled.   Time spent with patient: 21 minutes  Jacquiline Doe, EMT 06/04/17  ACTION: Home visit completed Next visit planned for 1 week

## 2017-06-10 ENCOUNTER — Other Ambulatory Visit: Payer: Self-pay

## 2017-06-10 DIAGNOSIS — R7989 Other specified abnormal findings of blood chemistry: Secondary | ICD-10-CM

## 2017-06-11 LAB — BASIC METABOLIC PANEL
BUN/Creatinine Ratio: 13 (ref 10–24)
BUN: 15 mg/dL (ref 8–27)
CALCIUM: 9.5 mg/dL (ref 8.6–10.2)
CHLORIDE: 101 mmol/L (ref 96–106)
CO2: 22 mmol/L (ref 20–29)
Creatinine, Ser: 1.14 mg/dL (ref 0.76–1.27)
GFR calc non Af Amer: 68 mL/min/{1.73_m2} (ref >59)
GFR, EST AFRICAN AMERICAN: 79 mL/min/{1.73_m2} (ref >59)
GLUCOSE: 147 mg/dL — AB (ref 65–99)
POTASSIUM: 4.5 mmol/L (ref 3.5–5.2)
Sodium: 137 mmol/L (ref 134–144)

## 2017-06-17 ENCOUNTER — Other Ambulatory Visit (HOSPITAL_COMMUNITY): Payer: Self-pay

## 2017-06-17 ENCOUNTER — Other Ambulatory Visit: Payer: Self-pay | Admitting: Cardiology

## 2017-06-17 MED FILL — METOPROLOL SUCCINATE ER 50: 50 | 30 days supply | Qty: 90 | Fill #2

## 2017-06-17 MED FILL — SPIRONOLACTONE 25 MG TABLET: 25 | 34 days supply | Qty: 34 | Fill #0

## 2017-06-17 MED FILL — FUROSEMIDE 40 MG TAB: 40 | 34 days supply | Qty: 34 | Fill #2

## 2017-06-17 MED FILL — AMIODARONE HCL 200 MG TAB: 200 | 30 days supply | Qty: 30 | Fill #0

## 2017-06-17 NOTE — Progress Notes (Signed)
Paramedicine Encounter    Patient ID: Darius [name], male    DOB: 05/18/1955, 62 y.o.   MRN: [id number]   Patient Care Team: Scot Jun, FNP as PCP - General (Family Medicine) [name], MD as Consulting Physician (Cardiology)  Patient Active Problem List   Diagnosis Date Noted  . Urine leukocytes 05/24/2017  . Atrial fibrillation with RVR ([place]) 04/17/2016  . Elevated troponin 04/17/2016  . CHF (congestive heart failure) ([place]) 04/17/2016  . Septic olecranon bursitis of right elbow 09/19/2015  . Lactic acid acidosis   . Dilated cardiomyopathy ([place]) 04/19/2014  . Type 2 diabetes mellitus ([place]) 04/09/2014  . Non compliance with medical treatment and diet 04/09/2014  . Chronic anticoagulation-Eliquis started 04/06/14 04/09/2014  . Abnormal chest CT-LUL nodule- needs f/u May 2016 04/09/2014  . Acute on chronic combined systolic and diastolic heart failure ([place]) 04/05/2014  . OSA - C-pap 03/22/2013  . Persistent atrial fibrillation ([place]) 03/27/2011  . Obesity, Class III, BMI 40-49.9 (morbid obesity) ([place]) 03/27/2011  . URI, acute 03/27/2011    Current Outpatient Medications:  .  amiodarone (PACERONE) 200 MG tablet, TAKE 1 TABLET BY MOUTH ONCE DAILY, Disp: 34 tablet, Rfl: 2 .  apixaban (ELIQUIS) 5 MG TABS tablet, Take 1 tablet (5 mg total) by mouth 2 (two) times daily., Disp: 180 tablet, Rfl: 3 .  atorvastatin (LIPITOR) 80 MG tablet, TAKE 1 TABLET (80 MG TOTAL) BY MOUTH AT BEDTIME., Disp: 30 tablet, Rfl: 11 .  furosemide (LASIX) 40 MG tablet, Take 1 tablet (40 mg total) by mouth daily., Disp: 30 tablet, Rfl: 4 .  metFORMIN (GLUCOPHAGE) 1000 MG tablet, TAKE 1 TABLET BY MOUTH TWICE A DAY WITH FOOD, Disp: 60 tablet, Rfl: 3 .  metoprolol succinate (TOPROL-XL) 50 MG 24 hr tablet, Take 1.5 tabs Twice daily Take with or immediately following a meal., Disp: 90 tablet, Rfl: 6 .  sacubitril-valsartan (ENTRESTO) 97-103 MG, Take 1 tablet by mouth 2 (two) times daily., Disp: 180 tablet,  Rfl: 3 .  spironolactone (ALDACTONE) 25 MG tablet, Take 1 tablet (25 mg total) by mouth daily., Disp: 30 tablet, Rfl: 2 .  Krill Oil (OMEGA-3) 500 MG CAPS, Take 1 capsule by mouth daily., Disp: , Rfl:  No Known Allergies    Social History   Socioeconomic History  . Marital status: Single    Spouse name: Not on file  . Number of children: Not on file  . Years of education: Not on file  . Highest education level: Not on file  Occupational History  . Occupation: unemployeed  Social Needs  . Financial resource strain: Not on file  . Food insecurity:    Worry: Not on file    Inability: Not on file  . Transportation needs:    Medical: Not on file    Non-medical: Not on file  Tobacco Use  . Smoking status: Never Smoker  . Smokeless tobacco: Never Used  Substance and Sexual Activity  . Alcohol use: Yes    Comment: some  . Drug use: No  . Sexual activity: Never  Lifestyle  . Physical activity:    Days per week: Not on file    Minutes per session: Not on file  . Stress: Not on file  Relationships  . Social connections:    Talks on phone: Not on file    Gets together: Not on file    Attends religious service: Not on file    Active member of club or organization: Not on file  Attends meetings of clubs or organizations: Not on file    Relationship status: Not on file  . Intimate partner violence:    Fear of current or ex partner: Not on file    Emotionally abused: Not on file    Physically abused: Not on file    Forced sexual activity: Not on file  Other Topics Concern  . Not on file  Social History Narrative   Admits that he eats poorly and eats a lot at work.      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    Physical Exam  Constitutional: He is oriented to person, place, and time.  Cardiovascular: Normal rate and regular rhythm.  Pulmonary/Chest: Effort normal and breath sounds normal.  Abdominal: Soft.  Musculoskeletal: Normal range of  motion. He exhibits no edema.  Neurological: He is alert and oriented to person, place, and time.  Skin: Skin is warm and dry.  Psychiatric: He has a normal mood and affect.        Future Appointments  Date Time Provider [place]  07/17/2017  1:00 PM [facility] ECHO 1-BUZZ [facility]  07/17/2017  2:00 PM [name], MD [facility] None  08/19/2017  1:40 PM Dorena Dew, FNP [facility] None    BP 110/72 (BP Location: Left Arm, Patient Position: Sitting, Cuff Size: Large)   Pulse 73   Resp 16   Wt 289 lb 6.4 oz (131.3 kg)   SpO2 96%   BMI 37.16 kg/m   Weight yesterday- "Can't remember" Last visit weight- 299 lb  [name] was seen at [facility] today and reported feeling generally well. He denied SOB, headache, dizziness or orthopnea. He has been compliant with his medications over the past two weeks. I ordered the necessary medications from the pharmacy and refilled his pillbox for the next two weeks. His weight was down significantly since our visit visit though he was not able to recall what he weighed yesterday. I encouraged him to start writing his weights down so this does not keep happening.   Time spent with patient: 27 minutes  Jacquiline Doe, EMT 06/17/17  ACTION: Home visit completed Next visit planned for 2 weeks

## 2017-07-02 ENCOUNTER — Other Ambulatory Visit (HOSPITAL_COMMUNITY): Payer: Self-pay

## 2017-07-02 NOTE — Progress Notes (Signed)
Paramedicine Encounter    Patient ID: Darius [name], male    DOB: [birth year], 62 y.o.   MRN: [id number]   Patient Care Team: Darius Jun, FNP as PCP - General (Family Medicine) Darius Dresser, MD as Consulting Physician (Cardiology)  Patient Active Problem List   Diagnosis Date Noted  . Urine leukocytes 05/24/2017  . Atrial fibrillation with RVR ([place]) 04/17/2016  . Elevated troponin 04/17/2016  . CHF (congestive heart failure) ([place]) 04/17/2016  . Septic olecranon bursitis of right elbow 09/19/2015  . Lactic acid acidosis   . Dilated cardiomyopathy ([place]) 04/19/2014  . Type 2 diabetes mellitus ([place]) 04/09/2014  . Non compliance with medical treatment and diet 04/09/2014  . Chronic anticoagulation-Eliquis started 04/06/14 04/09/2014  . Abnormal chest CT-LUL nodule- needs f/u May 2016 04/09/2014  . Acute on chronic combined systolic and diastolic heart failure ([place]) 04/05/2014  . OSA - C-pap 03/22/2013  . Persistent atrial fibrillation ([place]) 03/27/2011  . Obesity, Class III, BMI 40-49.9 (morbid obesity) ([place]) 03/27/2011  . URI, acute 03/27/2011    Current Outpatient Medications:  .  amiodarone (PACERONE) 200 MG tablet, TAKE 1 TABLET BY MOUTH ONCE DAILY, Disp: 34 tablet, Rfl: 2 .  amiodarone (PACERONE) 200 MG tablet, TAKE 1 TABLET BY MOUTH DAILY., Disp: 90 tablet, Rfl: 2 .  apixaban (ELIQUIS) 5 MG TABS tablet, Take 1 tablet (5 mg total) by mouth 2 (two) times daily., Disp: 180 tablet, Rfl: 3 .  atorvastatin (LIPITOR) 80 MG tablet, TAKE 1 TABLET (80 MG TOTAL) BY MOUTH AT BEDTIME., Disp: 30 tablet, Rfl: 11 .  furosemide (LASIX) 40 MG tablet, Take 1 tablet (40 mg total) by mouth daily., Disp: 30 tablet, Rfl: 4 .  metFORMIN (GLUCOPHAGE) 1000 MG tablet, TAKE 1 TABLET BY MOUTH TWICE A DAY WITH FOOD, Disp: 60 tablet, Rfl: 3 .  metoprolol succinate (TOPROL-XL) 50 MG 24 hr tablet, Take 1.5 tabs Twice daily Take with or immediately following a meal., Disp: 90 tablet, Rfl: 6 .   sacubitril-valsartan (ENTRESTO) 97-103 MG, Take 1 tablet by mouth 2 (two) times daily., Disp: 180 tablet, Rfl: 3 .  spironolactone (ALDACTONE) 25 MG tablet, Take 1 tablet (25 mg total) by mouth daily., Disp: 30 tablet, Rfl: 2 .  Krill Oil (OMEGA-3) 500 MG CAPS, Take 1 capsule by mouth daily., Disp: , Rfl:  No Known Allergies    Social History   Socioeconomic History  . Marital status: Single    Spouse name: Not on file  . Number of children: Not on file  . Years of education: Not on file  . Highest education level: Not on file  Occupational History  . Occupation: unemployeed  Social Needs  . Financial resource strain: Not on file  . Food insecurity:    Worry: Not on file    Inability: Not on file  . Transportation needs:    Medical: Not on file    Non-medical: Not on file  Tobacco Use  . Smoking status: Never Smoker  . Smokeless tobacco: Never Used  Substance and Sexual Activity  . Alcohol use: Yes    Comment: some  . Drug use: No  . Sexual activity: Never  Lifestyle  . Physical activity:    Days per week: Not on file    Minutes per session: Not on file  . Stress: Not on file  Relationships  . Social connections:    Talks on phone: Not on file    Gets together: Not on file    Attends  religious service: Not on file    Active member of club or organization: Not on file    Attends meetings of clubs or organizations: Not on file    Relationship status: Not on file  . Intimate partner violence:    Fear of current or ex partner: Not on file    Emotionally abused: Not on file    Physically abused: Not on file    Forced sexual activity: Not on file  Other Topics Concern  . Not on file  Social History Narrative   Admits that he eats poorly and eats a lot at work.      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    Physical Exam  Constitutional: He is oriented to person, place, and time.  Cardiovascular: Normal rate and regular rhythm.   Pulmonary/Chest: Effort normal and breath sounds normal.  Abdominal: Soft.  Musculoskeletal: Normal range of motion. He exhibits no edema.  Neurological: He is alert and oriented to person, place, and time.  Skin: Skin is warm and dry.  Psychiatric: He has a normal mood and affect.        Future Appointments  Date Time Provider [place]  07/17/2017  1:00 PM Darius [name] ECHO 1-BUZZ [name]  07/17/2017  2:00 PM Darius Dresser, MD [facility] None  08/19/2017  1:40 PM Darius Dew, FNP [facility] None    BP 94/60 (BP Location: Right Arm, Patient Position: Sitting, Cuff Size: Large)   Pulse 65   Resp 16   Wt (!) 308 lb (139.7 kg)   SpO2 94%   BMI 39.54 kg/m   Weight yesterday- unknown Last visit weight- 289 lb  [name] and reported feeling well. He stated he has been compliant with his medications and denied SOB, headache, dizziness or orthopnea. He initially reported that his weight yesterday was 295 lb but then said it was 298 lb and his weight today was 308 lb. I expressed concern over the reported weight gain and he started backtracking and eventually admitted that he is not weighing daily or at the same time of day when he weighs. I advised that without accurate weights it was impossible to make changes. He advised that he understands and would try putting his scale next to his bed. His medications were verified and his pillbox.   Time spent with patient: 46 minutes  Darius [name], EMT 07/02/17  ACTION: Home visit completed Next visit planned for 1 week

## 2017-07-16 ENCOUNTER — Other Ambulatory Visit (HOSPITAL_COMMUNITY): Payer: Self-pay | Admitting: Cardiology

## 2017-07-16 ENCOUNTER — Other Ambulatory Visit (HOSPITAL_COMMUNITY): Payer: Self-pay | Admitting: Student

## 2017-07-16 MED FILL — metFORMIN HCL 1000 MG TABS: 1000 | 30 days supply | Qty: 60 | Fill #3

## 2017-07-16 MED FILL — ATORVASTATIN 80 MG TABLET: 80 | 30 days supply | Qty: 30 | Fill #4

## 2017-07-16 MED FILL — METOPROLOL SUCCINATE ER 50: 50 | 30 days supply | Qty: 90 | Fill #3

## 2017-07-16 MED FILL — AMIODARONE HCL 200 MG TAB: 200 | 30 days supply | Qty: 30 | Fill #1

## 2017-07-17 ENCOUNTER — Other Ambulatory Visit (HOSPITAL_COMMUNITY): Payer: Self-pay

## 2017-07-17 ENCOUNTER — Ambulatory Visit (HOSPITAL_COMMUNITY): Admission: RE | Admit: 2017-07-17 | Payer: Medicaid Other | Source: Ambulatory Visit

## 2017-07-17 ENCOUNTER — Encounter (HOSPITAL_COMMUNITY): Payer: Self-pay | Admitting: Cardiology

## 2017-07-17 NOTE — Progress Notes (Signed)
Paramedicine Encounter    Patient ID: Darius [name], male    DOB: [birth year], 62 y.o.   MRN: [id number]   Patient Care Team: Scot Jun, FNP as PCP - General (Family Medicine) [name], MD as Consulting Physician (Cardiology)  Patient Active Problem List   Diagnosis Date Noted  . Urine leukocytes 05/24/2017  . Atrial fibrillation with RVR ([place]) 04/17/2016  . Elevated troponin 04/17/2016  . CHF (congestive heart failure) ([place]) 04/17/2016  . Septic olecranon bursitis of right elbow 09/19/2015  . Lactic acid acidosis   . Dilated cardiomyopathy ([place]) 04/19/2014  . Type 2 diabetes mellitus ([place]) 04/09/2014  . Non compliance with medical treatment and diet 04/09/2014  . Chronic anticoagulation-Eliquis started 04/06/14 04/09/2014  . Abnormal chest CT-LUL nodule- needs f/u May 2016 04/09/2014  . Acute on chronic combined systolic and diastolic heart failure ([place]) 04/05/2014  . OSA - C-pap 03/22/2013  . Persistent atrial fibrillation ([place]) 03/27/2011  . Obesity, Class III, BMI 40-49.9 (morbid obesity) ([place]) 03/27/2011  . URI, acute 03/27/2011    Current Outpatient Medications:  .  amiodarone (PACERONE) 200 MG tablet, TAKE 1 TABLET BY MOUTH ONCE DAILY, Disp: 34 tablet, Rfl: 2 .  apixaban (ELIQUIS) 5 MG TABS tablet, Take 1 tablet (5 mg total) by mouth 2 (two) times daily., Disp: 180 tablet, Rfl: 3 .  atorvastatin (LIPITOR) 80 MG tablet, TAKE 1 TABLET (80 MG TOTAL) BY MOUTH AT BEDTIME., Disp: 30 tablet, Rfl: 11 .  furosemide (LASIX) 40 MG tablet, TAKE 1 TABLET BY MOUTH ONCE DAILY, Disp: 100 tablet, Rfl: 2 .  metFORMIN (GLUCOPHAGE) 1000 MG tablet, TAKE 1 TABLET BY MOUTH TWICE A DAY WITH FOOD, Disp: 60 tablet, Rfl: 3 .  metoprolol succinate (TOPROL-XL) 50 MG 24 hr tablet, Take 1.5 tabs Twice daily Take with or immediately following a meal., Disp: 90 tablet, Rfl: 6 .  sacubitril-valsartan (ENTRESTO) 97-103 MG, Take 1 tablet by mouth 2 (two) times daily., Disp: 180 tablet, Rfl:  3 .  spironolactone (ALDACTONE) 25 MG tablet, TAKE 1 TABLET BY MOUTH ONCE DAILY, Disp: 34 tablet, Rfl: 2 .  amiodarone (PACERONE) 200 MG tablet, TAKE 1 TABLET BY MOUTH DAILY., Disp: 90 tablet, Rfl: 2 .  Krill Oil (OMEGA-3) 500 MG CAPS, Take 1 capsule by mouth daily., Disp: , Rfl:  No Known Allergies    Social History   Socioeconomic History  . Marital status: Single    Spouse name: Not on file  . Number of children: Not on file  . Years of education: Not on file  . Highest education level: Not on file  Occupational History  . Occupation: unemployeed  Social Needs  . Financial resource strain: Not on file  . Food insecurity:    Worry: Not on file    Inability: Not on file  . Transportation needs:    Medical: Not on file    Non-medical: Not on file  Tobacco Use  . Smoking status: Never Smoker  . Smokeless tobacco: Never Used  Substance and Sexual Activity  . Alcohol use: Yes    Comment: some  . Drug use: No  . Sexual activity: Never  Lifestyle  . Physical activity:    Days per week: Not on file    Minutes per session: Not on file  . Stress: Not on file  Relationships  . Social connections:    Talks on phone: Not on file    Gets together: Not on file    Attends religious service: Not on  file    Active member of club or organization: Not on file    Attends meetings of clubs or organizations: Not on file    Relationship status: Not on file  . Intimate partner violence:    Fear of current or ex partner: Not on file    Emotionally abused: Not on file    Physically abused: Not on file    Forced sexual activity: Not on file  Other Topics Concern  . Not on file  Social History Narrative   Admits that he eats poorly and eats a lot at work.      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    Physical Exam  Constitutional: He is oriented to person, place, and time.  Cardiovascular: Normal rate.  Pulmonary/Chest: Effort normal and breath sounds  normal.  Abdominal: Soft.  Musculoskeletal: Normal range of motion.  Neurological: He is alert and oriented to person, place, and time.  Skin: Skin is warm and dry.  Psychiatric: He has a normal mood and affect.        Future Appointments  Date Time Provider [place]  08/19/2017  1:40 PM Dorena Dew, FNP [facility] None  09/11/2017  1:00 PM [facility] ECHO 1-BUZZ [facility]ECH[facility]  09/11/2017  2:40 PM [name], MD [facility] None    BP 108/80 (BP Location: Left Arm, Patient Position: Sitting, Cuff Size: Large)   Pulse 71   Resp 16   Wt (!) 311 lb 12.8 oz (141.4 kg)   SpO2 96%   BMI 40.03 kg/m   Weight yesterday- did not weigh Last visit weight- 308 lb  Mr Zinn was scheduled to be seen at the HF clinic follow and echocardiogram however he arrived late and missed his echo. This negated the reason for seeing the provider so I saw him after he finished rescheduling his appointment. He denied SOB, headache, dizziness or orthopnea. He has been compliant with his medications but continues to eat a diet of fast food and frozen dinners. He is also not weighing himself regularly and says it is because he forgets. These are issues we have been facing since he came into the program and they are not improving despite best efforts. I explained that if he does not begin eating healthier and weighing himself regularly, my visits are not going to yield any benefits. He said he understood and said he would try to do better. His medications were verified and his pillbox was refilled.  Time spent with patient: 27 minutes  Jacquiline Doe, EMT 07/17/17  ACTION: Next visit planned for 2 weeks

## 2017-08-04 ENCOUNTER — Telehealth (HOSPITAL_COMMUNITY): Payer: Self-pay

## 2017-08-05 ENCOUNTER — Telehealth (HOSPITAL_COMMUNITY): Payer: Self-pay

## 2017-08-05 ENCOUNTER — Other Ambulatory Visit (HOSPITAL_COMMUNITY): Payer: Self-pay

## 2017-08-05 ENCOUNTER — Other Ambulatory Visit: Payer: Self-pay | Admitting: Family Medicine

## 2017-08-05 DIAGNOSIS — E119 Type 2 diabetes mellitus without complications: Secondary | ICD-10-CM

## 2017-08-05 MED FILL — FUROSEMIDE 40 MG TAB: 40 | 34 days supply | Qty: 100 | Fill #0

## 2017-08-05 MED FILL — AMIODARONE HCL 200 MG TAB: 200 | 30 days supply | Qty: 30 | Fill #2

## 2017-08-05 MED FILL — SPIRONOLACTONE 25 MG TABLET: 25 | 34 days supply | Qty: 34 | Fill #0

## 2017-08-05 NOTE — Telephone Encounter (Signed)
I called Darius [name] to schedule an appointment. He did not answer so I left a voicemail requesting he call me back.

## 2017-08-05 NOTE — Telephone Encounter (Signed)
I called Darius [name] to schedule an appointment. He stated that he had nothing going on this morning and was able to meet me within 30 minutes. We agreed to meet at 0945.

## 2017-08-05 NOTE — Progress Notes (Signed)
Paramedicine Encounter    Patient ID: Darius [name], male    DOB: 1955/05/18, 62 y.o.   MRN: [id number]   Patient Care Team: Darius Jun, FNP as PCP - General (Family Medicine) Darius Dresser, MD as Consulting Physician (Cardiology)  Patient Active Problem List   Diagnosis Date Noted  . Urine leukocytes 05/24/2017  . Atrial fibrillation with RVR ([place]) 04/17/2016  . Elevated troponin 04/17/2016  . CHF (congestive heart failure) ([place]) 04/17/2016  . Septic olecranon bursitis of right elbow 09/19/2015  . Lactic acid acidosis   . Dilated cardiomyopathy ([place]) 04/19/2014  . Type 2 diabetes mellitus ([place]) 04/09/2014  . Non compliance with medical treatment and diet 04/09/2014  . Chronic anticoagulation-Eliquis started 04/06/14 04/09/2014  . Abnormal chest CT-LUL nodule- needs f/u May 2016 04/09/2014  . Acute on chronic combined systolic and diastolic heart failure ([place]) 04/05/2014  . OSA - C-pap 03/22/2013  . Persistent atrial fibrillation ([place]) 03/27/2011  . Obesity, Class III, BMI 40-49.9 (morbid obesity) ([place]) 03/27/2011  . URI, acute 03/27/2011    Current Outpatient Medications:  .  amiodarone (PACERONE) 200 MG tablet, TAKE 1 TABLET BY MOUTH ONCE DAILY, Disp: 34 tablet, Rfl: 2 .  apixaban (ELIQUIS) 5 MG TABS tablet, Take 1 tablet (5 mg total) by mouth 2 (two) times daily., Disp: 180 tablet, Rfl: 3 .  atorvastatin (LIPITOR) 80 MG tablet, TAKE 1 TABLET (80 MG TOTAL) BY MOUTH AT BEDTIME., Disp: 30 tablet, Rfl: 11 .  furosemide (LASIX) 40 MG tablet, TAKE 1 TABLET BY MOUTH ONCE DAILY, Disp: 100 tablet, Rfl: 2 .  metFORMIN (GLUCOPHAGE) 1000 MG tablet, TAKE 1 TABLET BY MOUTH TWICE A DAY WITH FOOD, Disp: 60 tablet, Rfl: 3 .  metoprolol succinate (TOPROL-XL) 50 MG 24 hr tablet, Take 1.5 tabs Twice daily Take with or immediately following a meal., Disp: 90 tablet, Rfl: 6 .  sacubitril-valsartan (ENTRESTO) 97-103 MG, Take 1 tablet by mouth 2 (two) times daily., Disp: 180 tablet, Rfl:  3 .  spironolactone (ALDACTONE) 25 MG tablet, TAKE 1 TABLET BY MOUTH ONCE DAILY, Disp: 34 tablet, Rfl: 2 .  amiodarone (PACERONE) 200 MG tablet, TAKE 1 TABLET BY MOUTH DAILY., Disp: 90 tablet, Rfl: 2 .  Krill Oil (OMEGA-3) 500 MG CAPS, Take 1 capsule by mouth daily., Disp: , Rfl:  No Known Allergies    Social History   Socioeconomic History  . Marital status: Single    Spouse name: Not on file  . Number of children: Not on file  . Years of education: Not on file  . Highest education level: Not on file  Occupational History  . Occupation: unemployeed  Social Needs  . Financial resource strain: Not on file  . Food insecurity:    Worry: Not on file    Inability: Not on file  . Transportation needs:    Medical: Not on file    Non-medical: Not on file  Tobacco Use  . Smoking status: Never Smoker  . Smokeless tobacco: Never Used  Substance and Sexual Activity  . Alcohol use: Yes    Comment: some  . Drug use: No  . Sexual activity: Never  Lifestyle  . Physical activity:    Days per week: Not on file    Minutes per session: Not on file  . Stress: Not on file  Relationships  . Social connections:    Talks on phone: Not on file    Gets together: Not on file    Attends religious service: Not on  file    Active member of club or organization: Not on file    Attends meetings of clubs or organizations: Not on file    Relationship status: Not on file  . Intimate partner violence:    Fear of current or ex partner: Not on file    Emotionally abused: Not on file    Physically abused: Not on file    Forced sexual activity: Not on file  Other Topics Concern  . Not on file  Social History Narrative   Admits that he eats poorly and eats a lot at work.      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    Physical Exam  Constitutional: He is oriented to person, place, and time.  Cardiovascular: Normal rate and regular rhythm.  Pulmonary/Chest: Effort normal and  breath sounds normal.  Abdominal: Soft.  Musculoskeletal: Normal range of motion. He exhibits no edema.  Neurological: He is alert and oriented to person, place, and time.  Skin: Skin is warm and dry.  Psychiatric: He has a normal mood and affect.        Future Appointments  Date Time Provider [place]  08/19/2017  1:40 PM Darius Dew, FNP [facility] None  09/11/2017  1:00 PM [facility] ECHO 1-BUZZ [facility]  09/11/2017  2:40 PM Darius Dresser, MD [facility] None    BP 96/62 (BP Location: Left Arm, Patient Position: Sitting, Cuff Size: Large)   Pulse 74   Resp 16   Wt 295 lb (133.8 kg)   SpO2 96%   BMI 37.88 kg/m   Weight yesterday- Unknown Last visit weight- 311 lb  D[name] and reported feeling well. He stated he has been taking his medications however he should have been out in his pillbox but he still had 3 days worth of medicine. I asked about this and he seemed confused and said he did not know when he missed them. He is weighing daily but said his weight yesterday was in the 270's and today was 295 lb. He exhibited no edema, SOB, abdominal distension or JVD so I assumed he was not weighing properly. I asked if he was sure the scale was on a hard, flat surface and he said that he believes part of the scale might be on a rug. Since he has not allowed me into his house, I am not able to see how he is weighing. In the past I have asked him to bring his scale with him because of these issues so we may start this practice again. His medications were veriified and his pillbox was refilled. Darius [name] was ordered and is set to be delivered on Friday. Darius [name] said he would be home all day to receive the delivery.   Darius [name], EMT 08/05/17  ACTION: Home visit completed Next visit planned for 2 weeks

## 2017-08-19 ENCOUNTER — Ambulatory Visit: Payer: Self-pay | Admitting: Family Medicine

## 2017-08-20 ENCOUNTER — Telehealth (HOSPITAL_COMMUNITY): Payer: Self-pay

## 2017-08-20 ENCOUNTER — Telehealth (HOSPITAL_COMMUNITY): Payer: Self-pay | Admitting: *Deleted

## 2017-08-20 ENCOUNTER — Other Ambulatory Visit (HOSPITAL_COMMUNITY): Payer: Self-pay

## 2017-08-20 NOTE — Telephone Encounter (Signed)
I called Darius [name] to advise him of the clinic's instructions regarding his weight gain. He did not answer so I left the following instructions on his voicemail; Darrick Grinder, NP, advised him to take 80 mg of lasix for the next two days and follow a strict low sodium diet.

## 2017-08-20 NOTE — Progress Notes (Signed)
Paramedicine Encounter    Patient ID: Darius [name], male    DOB: 1955-02-19, 62 y.o.   MRN: [id number]   Patient Care Team: Darius Jun, FNP as PCP - General (Family Medicine) Darius Dresser, MD as Consulting Physician (Cardiology)  Patient Active Problem List   Diagnosis Date Noted  . Urine leukocytes 05/24/2017  . Atrial fibrillation with RVR ([place]) 04/17/2016  . Elevated troponin 04/17/2016  . CHF (congestive heart failure) ([place]) 04/17/2016  . Septic olecranon bursitis of right elbow 09/19/2015  . Lactic acid acidosis   . Dilated cardiomyopathy ([place]) 04/19/2014  . Type 2 diabetes mellitus ([place]) 04/09/2014  . Non compliance with medical treatment and diet 04/09/2014  . Chronic anticoagulation-Eliquis started 04/06/14 04/09/2014  . Abnormal chest CT-LUL nodule- needs f/u May 2016 04/09/2014  . Acute on chronic combined systolic and diastolic heart failure ([place] ) 04/05/2014  . OSA - C-pap 03/22/2013  . Persistent atrial fibrillation ([place]) 03/27/2011  . Obesity, Class III, BMI 40-49.9 (morbid obesity) ([place]) 03/27/2011  . URI, acute 03/27/2011    Current Outpatient Medications:  .  amiodarone (PACERONE) 200 MG tablet, TAKE 1 TABLET BY MOUTH ONCE DAILY, Disp: 34 tablet, Rfl: 2 .  apixaban (ELIQUIS) 5 MG TABS tablet, Take 1 tablet (5 mg total) by mouth 2 (two) times daily., Disp: 180 tablet, Rfl: 3 .  atorvastatin (LIPITOR) 80 MG tablet, TAKE 1 TABLET (80 MG TOTAL) BY MOUTH AT BEDTIME., Disp: 30 tablet, Rfl: 11 .  furosemide (LASIX) 40 MG tablet, TAKE 1 TABLET BY MOUTH ONCE DAILY, Disp: 100 tablet, Rfl: 2 .  metFORMIN (GLUCOPHAGE) 1000 MG tablet, TAKE 1 TABLET BY MOUTH TWICE A DAY WITH FOOD, Disp: 60 tablet, Rfl: 3 .  metoprolol succinate (TOPROL-XL) 50 MG 24 hr tablet, Take 1.5 tabs Twice daily Take with or immediately following a meal., Disp: 90 tablet, Rfl: 6 .  sacubitril-valsartan (ENTRESTO) 97-103 MG, Take 1 tablet by mouth 2 (two) times daily., Disp: 180 tablet, Rfl:  3 .  spironolactone (ALDACTONE) 25 MG tablet, TAKE 1 TABLET BY MOUTH ONCE DAILY, Disp: 34 tablet, Rfl: 2 .  amiodarone (PACERONE) 200 MG tablet, TAKE 1 TABLET BY MOUTH DAILY., Disp: 90 tablet, Rfl: 2 .  Krill Oil (OMEGA-3) 500 MG CAPS, Take 1 capsule by mouth daily., Disp: , Rfl:  No Known Allergies    Social History   Socioeconomic History  . Marital status: Single    Spouse name: Not on file  . Number of children: Not on file  . Years of education: Not on file  . Highest education level: Not on file  Occupational History  . Occupation: unemployeed  Social Needs  . Financial resource strain: Not on file  . Food insecurity:    Worry: Not on file    Inability: Not on file  . Transportation needs:    Medical: Not on file    Non-medical: Not on file  Tobacco Use  . Smoking status: Never Smoker  . Smokeless tobacco: Never Used  Substance and Sexual Activity  . Alcohol use: Yes    Comment: some  . Drug use: No  . Sexual activity: Never  Lifestyle  . Physical activity:    Days per week: Not on file    Minutes per session: Not on file  . Stress: Not on file  Relationships  . Social connections:    Talks on phone: Not on file    Gets together: Not on file    Attends religious service: Not on  file    Active member of club or organization: Not on file    Attends meetings of clubs or organizations: Not on file    Relationship status: Not on file  . Intimate partner violence:    Fear of current or ex partner: Not on file    Emotionally abused: Not on file    Physically abused: Not on file    Forced sexual activity: Not on file  Other Topics Concern  . Not on file  Social History Narrative   Admits that he eats poorly and eats a lot at work.      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    Physical Exam  Constitutional: He is oriented to person, place, and time.  Cardiovascular: Normal rate and regular rhythm.  Pulmonary/Chest: Effort normal and  breath sounds normal.  Abdominal: Soft. He exhibits no distension.  Musculoskeletal: Normal range of motion. He exhibits no edema.  Neurological: He is alert and oriented to person, place, and time.  Skin: Skin is warm and dry.  Psychiatric: He has a normal mood and affect.        Future Appointments  Date Time Provider [place]  09/04/2017  2:00 PM [name], [place] [facility] None  09/11/2017  1:00 PM [place] ECHO 1-BUZZ [facility]  09/11/2017  2:40 PM Darius Dresser, MD [facility] None    BP 112/70 (BP Location: Left Arm, Patient Position: Sitting, Cuff Size: Large)   Pulse 60   Resp 16   Wt (!) 311 lb 12.8 oz (141.4 kg)   SpO2 96%   BMI 40.03 kg/m   Weight yesterday- Did not weigh Last visit weight- 295 lb  D[name] and reported feeling well. He denied SOB, headache, dizziness or orthopnea. His diet consists of mainly fast food and frozen meals that he cooks in the microwave. He forgot to bring his pillbox with him to our appointment so I gave him a new box and he said he would be able to fill his other box base off the one I filled today. His medications were verified and his pillbox was refilled. I contacted the clinic due to his weight gain and am awaiting call back at the time of this note.   Darius [name], EMT 08/20/17  ACTION: Home visit completed Next visit planned for 2 weeks

## 2017-08-20 NOTE — Telephone Encounter (Signed)
Advanced Heart Failure Triage Encounter  Patient Name: Raymond Bhardwaj  Date of Call: 08/20/17  Problem:  Zack with paramed called to report patient's weight is up 16.8 lbs in 2 weeks.  Today it was 311.8 lbs and 2 weeks ago it was 295 lbs.  No edema/swelling, no sob, and no chest pain.  Continues to eat fast food all the time.    Plan:  Per Darrick Grinder, NP patient needs to take 80 mg of lasix for 2 days and strict fluid/sodium restriction.  Edwyna Ready will educate patient.   Darron Doom, RN

## 2017-08-20 NOTE — Telephone Encounter (Signed)
I called Mr Darius [name] this morning to schedule an appointment. He stated he was available to meet this morning so we agreed on 09:30.

## 2017-08-25 ENCOUNTER — Telehealth (HOSPITAL_COMMUNITY): Payer: Self-pay

## 2017-08-25 NOTE — Telephone Encounter (Signed)
I called Darius [name] to ensure he had taken the additional lasix. He had not taken the additional medications and he had not weighed himself since seeing me last week. I told him that it is vital to his health to weigh daily and he should take the additional lasix today and tomorrow. He was understanding and agreeable.

## 2017-09-03 ENCOUNTER — Telehealth (HOSPITAL_COMMUNITY): Payer: Self-pay

## 2017-09-03 ENCOUNTER — Other Ambulatory Visit: Payer: Self-pay | Admitting: Family Medicine

## 2017-09-03 ENCOUNTER — Other Ambulatory Visit (HOSPITAL_COMMUNITY): Payer: Self-pay

## 2017-09-03 DIAGNOSIS — E119 Type 2 diabetes mellitus without complications: Secondary | ICD-10-CM

## 2017-09-03 MED FILL — metFORMIN HCL 1000 MG TABS: 1000 | 30 days supply | Qty: 60 | Fill #0

## 2017-09-03 MED FILL — METOPROLOL SUCCINATE ER 50: 50 | 30 days supply | Qty: 90 | Fill #4

## 2017-09-03 MED FILL — ATORVASTATIN CALCIUM 80 MG: 80 | 30 days supply | Qty: 30 | Fill #5

## 2017-09-03 NOTE — Progress Notes (Signed)
Paramedicine Encounter    Patient ID: Darius [name], male    DOB: [birth year], 62 y.o.   MRN: [id number]   Patient Care Team: Scot Jun, FNP as PCP - General (Family Medicine) [name], MD as Consulting Physician (Cardiology)  Patient Active Problem List   Diagnosis Date Noted  . Urine leukocytes 05/24/2017  . Atrial fibrillation with RVR ([place]) 04/17/2016  . Elevated troponin 04/17/2016  . CHF (congestive heart failure) ([place]) 04/17/2016  . Septic olecranon bursitis of right elbow 09/19/2015  . Lactic acid acidosis   . Dilated cardiomyopathy ([place]) 04/19/2014  . Type 2 diabetes mellitus ([place]) 04/09/2014  . Non compliance with medical treatment and diet 04/09/2014  . Chronic anticoagulation-Eliquis started 04/06/14 04/09/2014  . Abnormal chest CT-LUL nodule- needs f/u May 2016 04/09/2014  . Acute on chronic combined systolic and diastolic heart failure ([place]) 04/05/2014  . OSA - C-pap 03/22/2013  . Persistent atrial fibrillation ([place]) 03/27/2011  . Obesity, Class III, BMI 40-49.9 (morbid obesity) ([place]) 03/27/2011  . URI, acute 03/27/2011    Current Outpatient Medications:  .  amiodarone (PACERONE) 200 MG tablet, TAKE 1 TABLET BY MOUTH ONCE DAILY, Disp: 34 tablet, Rfl: 2 .  apixaban (ELIQUIS) 5 MG TABS tablet, Take 1 tablet (5 mg total) by mouth 2 (two) times daily., Disp: 180 tablet, Rfl: 3 .  atorvastatin (LIPITOR) 80 MG tablet, TAKE 1 TABLET (80 MG TOTAL) BY MOUTH AT BEDTIME., Disp: 30 tablet, Rfl: 11 .  furosemide (LASIX) 40 MG tablet, TAKE 1 TABLET BY MOUTH ONCE DAILY, Disp: 100 tablet, Rfl: 2 .  metFORMIN (GLUCOPHAGE) 1000 MG tablet, TAKE 1 TABLET BY MOUTH TWICE A DAY WITH FOOD, Disp: 60 tablet, Rfl: 3 .  metoprolol succinate (TOPROL-XL) 50 MG 24 hr tablet, Take 1.5 tabs Twice daily Take with or immediately following a meal., Disp: 90 tablet, Rfl: 6 .  sacubitril-valsartan (ENTRESTO) 97-103 MG, Take 1 tablet by mouth 2 (two) times daily., Disp: 180 tablet, Rfl:  3 .  spironolactone (ALDACTONE) 25 MG tablet, TAKE 1 TABLET BY MOUTH ONCE DAILY, Disp: 34 tablet, Rfl: 2 .  amiodarone (PACERONE) 200 MG tablet, TAKE 1 TABLET BY MOUTH DAILY., Disp: 90 tablet, Rfl: 2 .  Krill Oil (OMEGA-3) 500 MG CAPS, Take 1 capsule by mouth daily., Disp: , Rfl:  No Known Allergies    Social History   Socioeconomic History  . Marital status: Single    Spouse name: Not on file  . Number of children: Not on file  . Years of education: Not on file  . Highest education level: Not on file  Occupational History  . Occupation: unemployeed  Social Needs  . Financial resource strain: Not on file  . Food insecurity:    Worry: Not on file    Inability: Not on file  . Transportation needs:    Medical: Not on file    Non-medical: Not on file  Tobacco Use  . Smoking status: Never Smoker  . Smokeless tobacco: Never Used  Substance and Sexual Activity  . Alcohol use: Yes    Comment: some  . Drug use: No  . Sexual activity: Never  Lifestyle  . Physical activity:    Days per week: Not on file    Minutes per session: Not on file  . Stress: Not on file  Relationships  . Social connections:    Talks on phone: Not on file    Gets together: Not on file    Attends religious service: Not on  file    Active member of club or organization: Not on file    Attends meetings of clubs or organizations: Not on file    Relationship status: Not on file  . Intimate partner violence:    Fear of current or ex partner: Not on file    Emotionally abused: Not on file    Physically abused: Not on file    Forced sexual activity: Not on file  Other Topics Concern  . Not on file  Social History Narrative   Admits that he eats poorly and eats a lot at work.      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    Physical Exam  Constitutional: He is oriented to person, place, and time.  Cardiovascular: Normal rate and regular rhythm.  Pulmonary/Chest: Effort normal and  breath sounds normal.  Abdominal: Soft.  Musculoskeletal: Normal range of motion. He exhibits edema.  Neurological: He is alert and oriented to person, place, and time.  Skin: Skin is warm and dry.  Psychiatric: He has a normal mood and affect.        Future Appointments  Date Time Provider [place]  09/04/2017  2:00 PM [name], [place] [facility] None  09/11/2017  1:00 PM [place] ECHO 1-BUZZ [facility]  09/11/2017  2:40 PM [name], MD [facility] None    BP 118/64 (BP Location: Left Arm, Patient Position: Sitting, Cuff Size: Large)   Pulse 65   Resp 16   Wt (!) 311 lb 9.6 oz (141.3 kg)   SpO2 97%   BMI 40.01 kg/m   Weight yesterday- did not weigh Last visit weight- 311.8 lb  [facility] Fan[facility] and reported feeling well. He denied SOB, headache, dizziness or orthopnea. He had been on vacation last week at the beach with friends and reported eating out often. This is not out of line with his normal lifestyle. His weight is still up but no more than at our last appointment. He reported that he has not been weighing daily because he left his scale in his car since he saw me last. I implored him to start weighing daily and recording it and he agreed however I don't know if he will follow through. His medications were verified and his pillbox was refilled. He ran out of Metformin so it was called in and I advised him to let me know when he picks it up from the pharmacy and I will finish putting it in his pillbox.   Jacquiline Doe, EMT 09/03/17  ACTION: Home visit completed Next visit planned for 2 week

## 2017-09-03 NOTE — Telephone Encounter (Signed)
I called Darius [name] to schedule an appointment. He did not answer so I left a voicemail requesting he call me back.

## 2017-09-03 NOTE — Telephone Encounter (Signed)
Darius [name] returned my phone call and advised he would be available to meet today at 11:00.

## 2017-09-04 ENCOUNTER — Ambulatory Visit (INDEPENDENT_AMBULATORY_CARE_PROVIDER_SITE_OTHER): Payer: Medicaid Other | Admitting: Family Medicine

## 2017-09-04 ENCOUNTER — Ambulatory Visit: Payer: Self-pay | Admitting: Family Medicine

## 2017-09-04 ENCOUNTER — Encounter: Payer: Self-pay | Admitting: Family Medicine

## 2017-09-04 VITALS — BP 100/55 | Temp 98.0°F | Resp 18 | Ht 74.0 in | Wt 313.0 lb

## 2017-09-04 DIAGNOSIS — E1100 Type 2 diabetes mellitus with hyperosmolarity without nonketotic hyperglycemic-hyperosmolar coma (NKHHC): Secondary | ICD-10-CM | POA: Diagnosis not present

## 2017-09-04 LAB — POCT GLYCOSYLATED HEMOGLOBIN (HGB A1C): Hemoglobin A1C: 6 % — AB (ref 4.0–5.6)

## 2017-09-04 NOTE — Progress Notes (Signed)
Diabetes Mellitus Type II, Follow-up: Patient here for follow-up of Type 2 diabetes mellitus.  Current symptoms/problems include paresthesia of the feet and have been unchanged. Symptoms have been present for several years.  Known diabetic complications: cardiovascular disease and peripheral vascular disease Cardiovascular risk factors: advanced age (older than 6 for men, 23 for women), diabetes mellitus, dyslipidemia, hypertension, obesity (BMI >= 30 kg/m2) and sedentary lifestyle Current diabetic medications include oral agent (monotherapy): metformin  Eye exam current (within one year): no Weight trend: stable Prior visit with dietician: no Current diet: "Not trying as hard as I should be" Current exercise: none  Current monitoring regimen: none Home blood sugar records: not checkin Any episodes of hypoglycemia? no  Is He on ACE inhibitor or angiotensin II receptor blocker?  Yes  . valsartan ([place]ovan)   He is compliant with medications.   Diabetic Labs Latest Ref Rng & Units 09/04/2017 06/10/2017 05/20/2017  HbA1c 4.0 - 5.6 % 6.0(A) - 6.5  Microalbumin mg/dL - - -  Micro/Creat Ratio <30 mcg/mg creat - - -  Chol <200 mg/dL - - -  HDL >40 mg/dL - - -  Calc LDL mg/dL (calc) - - -  Triglycerides <150 mg/dL - - -  Creatinine 0.76 - 1.27 mg/dL 1.34(H) 1.14 1.35(H)  GFR >60.00 mL/min - - -   Foot/eye exam completion dates 03/14/2013  Foot Form Completion Done    Lab Results  Component Value Date   POCGLU 119 (A) 10/04/2015    Diabetes Health Maintenance Due  Topic Date Due  . OPHTHALMOLOGY EXAM  01[birth year]09/1966  . FOOT EXAM  11/12/2017  . URINE MICROALBUMIN  11/12/2017  . HEMOGLOBIN A1C  03/07/2018   Pneumococcal Immunization Status  Topic Date Due  . PNEUMOCOCCAL POLYSACCHARIDE VACCINE (2) 03/26/2016   Past Medical History:  Diagnosis Date  . Atrial fibrillation ([place])    unable to tolerate versed and fentanyl sedation and required gen anesthesia for TEE;  s/p TEE-DCCV  (failed);  Amiodarone started  . Chronic diastolic heart failure ([place])    a. Echo 03/27/11: EF 50-55%, inferior hypokinesis, moderate LAE, mild RVE, normal pulmonary pressures. ;   b.  TEE (04/03/11): EF 40-45%, mild MR moderate LAE, no LAA clot, mild RAE;  c.  Echocardiogram (02/2013): EF 60-65%, Gr 2 DD, mildly dilated Ao root (Ao root dimension 39 mm), MAC, mild LAE, normal RVF  . Diabetes mellitus, type 2 ([place])   . Hx of cardiovascular stress test    Lexiscan Myoview (02/2013): No ischemia or scar, EF 51%, low risk  . Morbid obesity ([place])   . Sleep apnea    a. sleep test 01/2013:  very severy OSA    Social History   Socioeconomic History  . Marital status: Single    Spouse name: Not on file  . Number of children: Not on file  . Years of education: Not on file  . Highest education level: Not on file  Occupational History  . Occupation: unemployeed  Social Needs  . Financial resource strain: Not on file  . Food insecurity:    Worry: Not on file    Inability: Not on file  . Transportation needs:    Medical: Not on file    Non-medical: Not on file  Tobacco Use  . Smoking status: Never Smoker  . Smokeless tobacco: Never Used  Substance and Sexual Activity  . Alcohol use: Yes    Comment: some  . Drug use: No  . Sexual activity: Never  Lifestyle  .  Physical activity:    Days per week: Not on file    Minutes per session: Not on file  . Stress: Not on file  Relationships  . Social connections:    Talks on phone: Not on file    Gets together: Not on file    Attends religious service: Not on file    Active member of club or organization: Not on file    Attends meetings of clubs or organizations: Not on file    Relationship status: Not on file  . Intimate partner violence:    Fear of current or ex partner: Not on file    Emotionally abused: Not on file    Physically abused: Not on file    Forced sexual activity: Not on file  Other Topics Concern  . Not on file  Social  History Narrative   Admits that he eats poorly and eats a lot at work.      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    No Known Allergies   Review of Systems  Constitutional: Negative.   HENT: Negative.   Eyes: Negative.   Respiratory: Negative.   Cardiovascular: Negative.   Gastrointestinal: Negative.   Genitourinary: Negative.   Musculoskeletal: Negative.   Skin: Negative.   Neurological: Negative.   Psychiatric/Behavioral: Negative.      Objective    BP (!) 100/55 (BP Location: Right Arm, Patient Position: Sitting, Cuff Size: Large)   Temp 98 F (36.7 C) (Oral)   Resp 18   Ht _0  (1.88 m)   Wt (!) 313 lb (142 kg)   SpO2 97%   BMI 40.19 kg/m    Physical Exam  Constitutional: He is oriented to person, place, and time and well-developed, well-nourished, and in no distress. No distress.  HENT:  Head: Normocephalic and atraumatic.  Eyes: Pupils are equal, round, and reactive to light. Conjunctivae and EOM are normal.  Neck: Normal range of motion. Neck supple.  Cardiovascular: Normal rate, regular rhythm and intact distal pulses. Exam reveals no gallop and no friction rub.  No murmur heard. Pulmonary/Chest: Effort normal and breath sounds normal. No respiratory distress. He has no wheezes.  Abdominal: Soft. Bowel sounds are normal. There is no tenderness.  Musculoskeletal: Normal range of motion. He exhibits no edema or tenderness.  Lymphadenopathy:    He has no cervical adenopathy.  Neurological: He is alert and oriented to person, place, and time. Gait normal.  Skin: Skin is warm and dry.  Psychiatric: Mood, memory, affect and judgment normal.  Nursing note and vitals reviewed.    Assessment   Jyair was seen today for diabetes.  Diagnoses and all orders for this visit:  Type 2 diabetes mellitus with hyperosmolarity without coma, without long-term current use of insulin (HCC) -     HgB A1c -     Basic Metabolic Panel    Continue  with current medications. Instructed patient to do modified carb diet with 40 minutes of vigorous exercise daily.   Return to care as scheduled and prn. Patient verbalized understanding and agreed with plan of care.

## 2017-09-04 NOTE — Patient Instructions (Addendum)
Diabetes Mellitus and Nutrition When you have diabetes (diabetes mellitus), it is very important to have healthy eating habits because your blood sugar (glucose) levels are greatly affected by what you eat and drink. Eating healthy foods in the appropriate amounts, at about the same times every day, can help you:  Control your blood glucose.  Lower your risk of heart disease.  Improve your blood pressure.  Reach or maintain a healthy weight.  Every person with diabetes is different, and each person has different needs for a meal plan. Your health care provider may recommend that you work with a diet and nutrition specialist (dietitian) to make a meal plan that is best for you. Your meal plan may vary depending on factors such as:  The calories you need.  The medicines you take.  Your weight.  Your blood glucose, blood pressure, and cholesterol levels.  Your activity level.  Other health conditions you have, such as heart or kidney disease.  How do carbohydrates affect me? Carbohydrates affect your blood glucose level more than any other type of food. Eating carbohydrates naturally increases the amount of glucose in your blood. Carbohydrate counting is a method for keeping track of how many carbohydrates you eat. Counting carbohydrates is important to keep your blood glucose at a healthy level, especially if you use insulin or take certain oral diabetes medicines. It is important to know how many carbohydrates you can safely have in each meal. This is different for every person. Your dietitian can help you calculate how many carbohydrates you should have at each meal and for snack. Foods that contain carbohydrates include:  Bread, cereal, rice, pasta, and crackers.  Potatoes and corn.  Peas, beans, and lentils.  Milk and yogurt.  Fruit and juice.  Desserts, such as cakes, cookies, ice cream, and candy.  How does alcohol affect me? Alcohol can cause a sudden decrease in blood  glucose (hypoglycemia), especially if you use insulin or take certain oral diabetes medicines. Hypoglycemia can be a life-threatening condition. Symptoms of hypoglycemia (sleepiness, dizziness, and confusion) are similar to symptoms of having too much alcohol. If your health care provider says that alcohol is safe for you, follow these guidelines:  Limit alcohol intake to no more than 1 drink per day for nonpregnant women and 2 drinks per day for men. One drink equals 12 oz of beer, 5 oz of wine, or 1 oz of hard liquor.  Do not drink on an empty stomach.  Keep yourself hydrated with water, diet soda, or unsweetened iced tea.  Keep in mind that regular soda, juice, and other mixers may contain a lot of sugar and must be counted as carbohydrates.  What are tips for following this plan? Reading food labels  Start by checking the serving size on the label. The amount of calories, carbohydrates, fats, and other nutrients listed on the label are based on one serving of the food. Many foods contain more than one serving per package.  Check the total grams (g) of carbohydrates in one serving. You can calculate the number of servings of carbohydrates in one serving by dividing the total carbohydrates by 15. For example, if a food has 30 g of total carbohydrates, it would be equal to 2 servings of carbohydrates.  Check the number of grams (g) of saturated and trans fats in one serving. Choose foods that have low or no amount of these fats.  Check the number of milligrams (mg) of sodium in one serving. Most people   should limit total sodium intake to less than 2,300 mg per day.  Always check the nutrition information of foods labeled as "low-fat" or "nonfat". These foods may be higher in added sugar or refined carbohydrates and should be avoided.  Talk to your dietitian to identify your daily goals for nutrients listed on the label. Shopping  Avoid buying canned, premade, or processed foods. These  foods tend to be high in fat, sodium, and added sugar.  Shop around the outside edge of the grocery store. This includes fresh fruits and vegetables, bulk grains, fresh meats, and fresh dairy. Cooking  Use low-heat cooking methods, such as baking, instead of high-heat cooking methods like deep frying.  Cook using healthy oils, such as olive, canola, or sunflower oil.  Avoid cooking with butter, cream, or high-fat meats. Meal planning  Eat meals and snacks regularly, preferably at the same times every day. Avoid going long periods of time without eating.  Eat foods high in fiber, such as fresh fruits, vegetables, beans, and whole grains. Talk to your dietitian about how many servings of carbohydrates you can eat at each meal.  Eat 4-6 ounces of lean protein each day, such as lean meat, chicken, fish, eggs, or tofu. 1 ounce is equal to 1 ounce of meat, chicken, or fish, 1 egg, or 1/4 cup of tofu.  Eat some foods each day that contain healthy fats, such as avocado, nuts, seeds, and fish. Lifestyle   Check your blood glucose regularly.  Exercise at least 30 minutes 5 or more days each week, or as told by your health care provider.  Take medicines as told by your health care provider.  Do not use any products that contain nicotine or tobacco, such as cigarettes and e-cigarettes. If you need help quitting, ask your health care provider.  Work with a counselor or diabetes educator to identify strategies to manage stress and any emotional and social challenges. What are some questions to ask my health care provider?  Do I need to meet with a diabetes educator?  Do I need to meet with a dietitian?  What number can I call if I have questions?  When are the best times to check my blood glucose? Where to find more information:  American Diabetes Association: diabetes.org/food-and-fitness/food  Academy of Nutrition and Dietetics:  www.eatright.org/resources/health/diseases-and-conditions/diabetes  National Institute of Diabetes and Digestive and Kidney Diseases (NIH): www.niddk.nih.gov/health-information/diabetes/overview/diet-eating-physical-activity Summary  A healthy meal plan will help you control your blood glucose and maintain a healthy lifestyle.  Working with a diet and nutrition specialist (dietitian) can help you make a meal plan that is best for you.  Keep in mind that carbohydrates and alcohol have immediate effects on your blood glucose levels. It is important to count carbohydrates and to use alcohol carefully. This information is not intended to replace advice given to you by your health care provider. Make sure you discuss any questions you have with your health care provider. Document Released: 10/24/2004 Document Revised: 03/03/2016 Document Reviewed: 03/03/2016 Elsevier Interactive Patient Education  2018 Elsevier Inc.  

## 2017-09-05 LAB — BASIC METABOLIC PANEL
BUN/Creatinine Ratio: 12 (ref 10–24)
BUN: 16 mg/dL (ref 8–27)
CO2: 23 mmol/L (ref 20–29)
Calcium: 9 mg/dL (ref 8.6–10.2)
Chloride: 100 mmol/L (ref 96–106)
Creatinine, Ser: 1.34 mg/dL — ABNORMAL HIGH (ref 0.76–1.27)
GFR calc Af Amer: 65 mL/min/{1.73_m2} (ref >59)
GFR calc non Af Amer: 56 mL/min/{1.73_m2} — ABNORMAL LOW (ref >59)
Glucose: 159 mg/dL — ABNORMAL HIGH (ref 65–99)
Potassium: 4.2 mmol/L (ref 3.5–5.2)
Sodium: 138 mmol/L (ref 134–144)

## 2017-09-08 DIAGNOSIS — Z736 Limitation of activities due to disability: Secondary | ICD-10-CM

## 2017-09-11 ENCOUNTER — Ambulatory Visit (HOSPITAL_BASED_OUTPATIENT_CLINIC_OR_DEPARTMENT_OTHER)
Admission: RE | Admit: 2017-09-11 | Discharge: 2017-09-11 | Disposition: A | Discharge status: Home / Self Care | Payer: Medicaid Other | Source: Ambulatory Visit | Attending: Cardiology | Admitting: Cardiology

## 2017-09-11 ENCOUNTER — Other Ambulatory Visit (HOSPITAL_COMMUNITY): Payer: Self-pay

## 2017-09-11 ENCOUNTER — Telehealth (HOSPITAL_COMMUNITY): Payer: Self-pay

## 2017-09-11 ENCOUNTER — Ambulatory Visit (HOSPITAL_COMMUNITY)
Admission: RE | Admit: 2017-09-11 | Discharge: 2017-09-11 | Disposition: A | Discharge status: Home / Self Care | Payer: Medicaid Other | Source: Ambulatory Visit | Attending: Cardiology | Admitting: Cardiology

## 2017-09-11 VITALS — BP 108/65 | HR 67 | Wt 308.0 lb

## 2017-09-11 DIAGNOSIS — Z833 Family history of diabetes mellitus: Secondary | ICD-10-CM | POA: Diagnosis not present

## 2017-09-11 DIAGNOSIS — I5042 Chronic combined systolic (congestive) and diastolic (congestive) heart failure: Secondary | ICD-10-CM | POA: Diagnosis not present

## 2017-09-11 DIAGNOSIS — I428 Other cardiomyopathies: Secondary | ICD-10-CM | POA: Diagnosis not present

## 2017-09-11 DIAGNOSIS — Z7901 Long term (current) use of anticoagulants: Secondary | ICD-10-CM | POA: Diagnosis not present

## 2017-09-11 DIAGNOSIS — I509 Heart failure, unspecified: Secondary | ICD-10-CM | POA: Diagnosis not present

## 2017-09-11 DIAGNOSIS — I5022 Chronic systolic (congestive) heart failure: Secondary | ICD-10-CM | POA: Diagnosis not present

## 2017-09-11 DIAGNOSIS — I5043 Acute on chronic combined systolic (congestive) and diastolic (congestive) heart failure: Secondary | ICD-10-CM

## 2017-09-11 DIAGNOSIS — I4891 Unspecified atrial fibrillation: Secondary | ICD-10-CM | POA: Diagnosis not present

## 2017-09-11 DIAGNOSIS — Z8249 Family history of ischemic heart disease and other diseases of the circulatory system: Secondary | ICD-10-CM | POA: Insufficient documentation

## 2017-09-11 DIAGNOSIS — Z6839 Body mass index (BMI) 39.0-39.9, adult: Secondary | ICD-10-CM | POA: Insufficient documentation

## 2017-09-11 DIAGNOSIS — I480 Paroxysmal atrial fibrillation: Secondary | ICD-10-CM | POA: Diagnosis not present

## 2017-09-11 DIAGNOSIS — E119 Type 2 diabetes mellitus without complications: Secondary | ICD-10-CM | POA: Diagnosis not present

## 2017-09-11 DIAGNOSIS — Z79899 Other long term (current) drug therapy: Secondary | ICD-10-CM | POA: Diagnosis not present

## 2017-09-11 DIAGNOSIS — E785 Hyperlipidemia, unspecified: Secondary | ICD-10-CM | POA: Insufficient documentation

## 2017-09-11 DIAGNOSIS — G4733 Obstructive sleep apnea (adult) (pediatric): Secondary | ICD-10-CM | POA: Diagnosis not present

## 2017-09-11 DIAGNOSIS — I2510 Atherosclerotic heart disease of native coronary artery without angina pectoris: Secondary | ICD-10-CM | POA: Insufficient documentation

## 2017-09-11 DIAGNOSIS — Z7984 Long term (current) use of oral hypoglycemic drugs: Secondary | ICD-10-CM | POA: Insufficient documentation

## 2017-09-11 LAB — COMPREHENSIVE METABOLIC PANEL
ALK PHOS: 62 U/L (ref 38–126)
ALT: 23 U/L (ref 0–44)
ANION GAP: 10 (ref 5–15)
AST: 20 U/L (ref 15–41)
Albumin: 3.7 g/dL (ref 3.5–5.0)
BILIRUBIN TOTAL: 0.7 mg/dL (ref 0.3–1.2)
BUN: 17 mg/dL (ref 8–23)
CALCIUM: 9.3 mg/dL (ref 8.9–10.3)
CO2: 25 mmol/L (ref 22–32)
CREATININE: 1.16 mg/dL (ref 0.61–1.24)
Chloride: 105 mmol/L (ref 98–111)
GFR calc non Af Amer: >60 mL/min (ref >60)
Glucose, Bld: 112 mg/dL — ABNORMAL HIGH (ref 70–99)
Potassium: 4.1 mmol/L (ref 3.5–5.1)
SODIUM: 140 mmol/L (ref 135–145)
TOTAL PROTEIN: 6.7 g/dL (ref 6.5–8.1)

## 2017-09-11 LAB — T4, FREE: Free T4: 1.16 ng/dL (ref 0.82–1.77)

## 2017-09-11 LAB — TSH: TSH: 0.085 u[IU]/mL — ABNORMAL LOW (ref 0.350–4.500)

## 2017-09-11 NOTE — Progress Notes (Signed)
  Echocardiogram 2D Echocardiogram has been performed.  Paquita Printy L Androw 09/11/2017, 1:50 PM

## 2017-09-11 NOTE — Telephone Encounter (Signed)
I called Darius [name] to remind him of his appointment this afternoon for an echocardiogram and follow up immediately afterwards. I also advised him to bring his medications with him in the even that [name] makes a medication change based off his echo. He was understanding and agreeable.

## 2017-09-11 NOTE — Patient Instructions (Signed)
Routine lab work today. Will notify you of abnormal results, otherwise no news is good news!  No changes to medication at this time.  Will refer you to electrophysiology at [facility]. Address: [address] ([place]), [place],  [address]  Phone: [phone] Their office will call to schedule.  Follow up 6 months with Dr. [name]. We will call you closer to this time, or you may call our office to schedule 1 month before you are due to be seen. Take all medication as prescribed the day of your appointment. Bring all medications with you to your appointment.  Do the following things EVERYDAY: 1) Weigh yourself in the morning before breakfast. Write it down and keep it in a log. 2) Take your medicines as prescribed 3) Eat low salt foods-Limit salt (sodium) to 2000 mg per day.  4) Stay as active as you can everyday 5) Limit all fluids for the day to less than 2 liters

## 2017-09-11 NOTE — Progress Notes (Signed)
Paramedicine Encounter   Patient ID: Darius [name] , male,   DOB: 21-Nov-1955,61 y.o.,  MRN: [id number]  Darius [name] was seen at the HF clinic today with Darius Mountain, NP. He reported feeling well and had been compliant with his medications over the past week. Per [name], his echo showed an improved EF, thus no medications changes were necessary. We will plan to meet next Thursday morning at [facility] for our biweekly visit.  Jacquiline Doe, EMT 09/11/2017   ACTION: Next visit planned for 1 week

## 2017-09-11 NOTE — Progress Notes (Signed)
Advanced Heart Failure Clinic Note   PCP: Colin Benton, DO  HF Cardiologist:  Dr. [name]  HPI: Darius [name] is a 62 y.o. 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   Admitted 03/19/76-2/42/35 for acute systolic CHF and atrial fibrillation with RVR. Had a R/L heart cath on 04/21/16, no CAD, right heart pressures elevated, low output. Echo showed EF 15%. Plan was to undergo TEE/DC-CV, however he was found to have a left atrial appendage thrombus and had only started Eliquis 5 days prior. He was discharged with plans for TEE/DC-CV in about a month. He was started on Entresto 24/26 mg BID during admission. Discharged home on Lasix 40 mg daily, digoxin 0.12m, and metoprolol XL 50 mg BID. Discharge 280 pounds.   Repeat TEE done in 4/18 for possible cardioversion.  EF remained 25-30%.  He still had a small, amorphous LA appendage thrombus (smaller than prior but still present).  He admitted to having missed some Eliquis doses.    He finally had TEE-guided DCCV in 7/18 with conversion to NSR.  TEE showed EF 40-45%.   He returns today for regular follow up. Last visit Toprol was increased. Overall doing fine. Denies SOB. Okay with hills or steps. Denies edema, orthopnea, or PND. No CP or dizziness. Eats out for most meals or eats frozen meals. Had fried fish last night. Takes medications most of the time (~85% per [facility]. Does not weigh daily. Trying to limit junk food. Weights 308-313 lbs when he meets with ZThedore Mins   ECG (personally reviewed): NSR, poor RWP  Cardiac Studies  RHC/LHC 04/21/2016  RA mean 14 RV 45/15 PA 50/28, mean 36 PCWP mean 22 LV 104/20 AO 101/75 Oxygen saturations: PA 59% AO 92% Cardiac Output (Fick) 4.85  Cardiac Index (Fick) 1.89 PVR 2.9 WU  - TEE: 04/2016 with LAA thrombus.  - TEE: 4/18 with EF 25-30%, diffuse hypokinesis, RV mildly dilated with moderately  decreased systolic function, amorphous thrombus LA appendage (improved compared to 3/18).  -TEE 09/08/2016 EF 40-45% No thrombus. Successful DC-CV  - ECHO 04/24/2016 EF 15% Peak PA pressure 38 mm hg   Labs  04/24/2016: K 4.5 Creatinine 1.00, digoxin < 0.2 4/18: K 4.3, creatinine 1.02 10/18: Thyroid panel normal, K 4.6, creatinine 1.05, LFTs normal, hgb 14.4 11/18: LDL 42, HDL 62 1/19: K 4.6, creatinine 0.97, LFTs normal, BNP 129  Review of systems complete and found to be negative unless listed in HPI.   SH:  Social History   Socioeconomic History  . Marital status: Single    Spouse name: Not on file  . Number of children: Not on file  . Years of education: Not on file  . Highest education level: Not on file  Occupational History  . Occupation: unemployeed  Social Needs  . Financial resource strain: Not on file  . Food insecurity:    Worry: Not on file    Inability: Not on file  . Transportation needs:    Medical: Not on file    Non-medical: Not on file  Tobacco Use  . Smoking status: Never Smoker  . Smokeless tobacco: Never Used  Substance and Sexual Activity  . Alcohol use: Yes    Comment: some  . Drug use: No  . Sexual activity: Never  Lifestyle  . Physical activity:    Days per week: Not on file    Minutes per session: Not on  file  . Stress: Not on file  Relationships  . Social connections:    Talks on phone: Not on file    Gets together: Not on file    Attends religious service: Not on file    Active member of club or organization: Not on file    Attends meetings of clubs or organizations: Not on file    Relationship status: Not on file  . Intimate partner violence:    Fear of current or ex partner: Not on file    Emotionally abused: Not on file    Physically abused: Not on file    Forced sexual activity: Not on file  Other Topics Concern  . Not on file  Social History Narrative   Admits that he eats poorly and eats a lot at work.      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    FH:  Family History  Problem Relation Age of Onset  . Diabetes Father        died in his 80s  . Hypertension Father   . Seizures Mother        died in her 33s  . Hypertension Sister   . Hypertension Paternal Grandfather   . Heart attack Neg Hx   . Stroke Neg Hx     Past Medical History:  Diagnosis Date  . Atrial fibrillation ([place])    unable to tolerate versed and fentanyl sedation and required gen anesthesia for TEE;  s/p TEE-DCCV (failed);  Amiodarone started  . Chronic diastolic heart failure ([place])    a. Echo 03/27/11: EF 50-55%, inferior hypokinesis, moderate LAE, mild RVE, normal pulmonary pressures. ;   b.  TEE (04/03/11): EF 40-45%, mild MR moderate LAE, no LAA clot, mild RAE;  c.  Echocardiogram (02/2013): EF 60-65%, Gr 2 DD, mildly dilated Ao root (Ao root dimension 39 mm), MAC, mild LAE, normal RVF  . Diabetes mellitus, type 2 ([place])   . Hx of cardiovascular stress test    Lexiscan Myoview (02/2013): No ischemia or scar, EF 51%, low risk  . Morbid obesity ([place])   . Sleep apnea    a. sleep test 01/2013:  very severy OSA    Current Outpatient Medications  Medication Sig Dispense Refill  . amiodarone (PACERONE) 200 MG tablet TAKE 1 TABLET BY MOUTH ONCE DAILY 34 tablet 2  . apixaban (ELIQUIS) 5 MG TABS tablet Take 1 tablet (5 mg total) by mouth 2 (two) times daily. 180 tablet 3  . atorvastatin (LIPITOR) 80 MG tablet TAKE 1 TABLET (80 MG TOTAL) BY MOUTH AT BEDTIME. 30 tablet 11  . furosemide (LASIX) 40 MG tablet TAKE 1 TABLET BY MOUTH ONCE DAILY 100 tablet 2  . Krill Oil (OMEGA-3) 500 MG CAPS Take 1 capsule by mouth daily.    . metFORMIN (GLUCOPHAGE) 1000 MG tablet TAKE 1 TABLET BY MOUTH TWICE A DAY WITH FOOD 60 tablet 3  . metoprolol succinate (TOPROL-XL) 50 MG 24 hr tablet Take 1.5 tabs Twice daily Take with or immediately following a meal. 90 tablet 6  . sacubitril-valsartan (ENTRESTO) 97-103 MG Take 1 tablet by mouth 2  (two) times daily. 180 tablet 3  . spironolactone (ALDACTONE) 25 MG tablet TAKE 1 TABLET BY MOUTH ONCE DAILY 34 tablet 2   No current facility-administered medications for this encounter.     Vitals:   09/11/17 1441  BP: 108/65  Pulse: 67  SpO2: 97%  Weight: (!) 308 lb (139.7 kg)  Wt Readings from Last 3 Encounters:  09/11/17 (!) 308 lb (139.7 kg)  09/04/17 (!) 313 lb (142 kg)  09/03/17 (!) 311 lb 9.6 oz (141.3 kg)    PHYSICAL EXAM: General: Obese. No resp difficulty. HEENT: Normal Neck: Supple. JVP thick, 5-6. Carotids 2+ bilat; no bruits. No thyromegaly or nodule noted. Cor: PMI nondisplaced. RRR, No M/G/R noted Lungs: CTAB, normal effort. Abdomen: Soft, non-tender, non-distended, no HSM. No bruits or masses. +BS  Extremities: No cyanosis, clubbing, or rash. R and LLE no edema. Rash on BLE. (says he had poison oak recently)  Neuro: Alert & orientedx3, cranial nerves grossly intact. moves all 4 extremities w/o difficulty. Affect pleasant   ASSESSMENT & PLAN:  1. Chronic systolic CHF: Had [facility] 0/0174 showing minimal CAD.  Nonischemic cardiomyopathy, possibly tachycardia-mediated in the setting of rapid atrial fibrillation.   Echo 04/2016 EF 15% but improved to 40-45% on TEE in 7/18. Volume status stable on exam (suspect weight gain may be caloric).  NYHA class II symptoms, feels good.   - Echo today: EF 55-60% with normal RV.  - Continue lasix 40 mg daily  - Continue Entresto 97/103 bid.     - Continue spironolactone 25 mg daily.  - Continue Toprol XL 75 mg bid.  2. Atrial fibrillation: Paroxysmal atrial fibrillation. Now back in NSR after DCCV in 7/18. 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    - Continue amiodarone 200 mg daily. Check LFTs and TFTs. Will need regular eye exams.  - Continue apixaban 5 mg bid.  Denies bleeding.  - [name] would like him considered for atrial fibrillation ablation.  Want to avoid long-term amiodarone, but given suspected tachy-mediated CMP, do not  want him to go back into atrial fibrillation.  Dr Jackalyn Lombard office tried to contact him for 3 months, but could not get a hold of him.  3. HLD: Continue statin. No change.  4. OSA: Continue CPAP nightly.  No change.   Georgiana Shore 09/11/2017  Patient seen with NP, agree with the above note.    He is doing well, no significant dyspnea.  I reviewed today's echo, EF is back up to 55-60%, much improved.  I suspect that he had a tachycardia-mediated cardiomyopathy.  He will continue his current cardiac meds.   Continue amiodarone for now to maintain NSR but would like to avoid long-term use of this medication.  I will refer him to EP for consideration of atrial fibrillation ablation.  Send CMET/TSH today.    Followup in 6 months.   Loralie Champagne 09/13/2017

## 2017-09-12 LAB — T3, FREE: T3, Free: 3.8 pg/mL (ref 2.0–4.4)

## 2017-09-16 ENCOUNTER — Telehealth (HOSPITAL_COMMUNITY): Payer: Self-pay

## 2017-09-16 NOTE — Telephone Encounter (Signed)
I called Darius [name] to see if he would be available to meet tomorrow morning at 09:00. He stated he would likely be able to but asked that I call and remind him around 08:30.

## 2017-09-17 ENCOUNTER — Other Ambulatory Visit (HOSPITAL_COMMUNITY): Payer: Self-pay

## 2017-09-17 NOTE — Progress Notes (Signed)
Paramedicine Encounter    Patient ID: Darius [name], male    DOB: Jun 22, 1955, 62 y.o.   MRN: [id number]   Patient Care Team: [name], [place] as PCP - General (Family Medicine) [name], MD as Consulting Physician (Cardiology)  Patient Active Problem List   Diagnosis Date Noted  . Urine leukocytes 05/24/2017  . Atrial fibrillation with RVR ([place]) 04/17/2016  . Elevated troponin 04/17/2016  . CHF (congestive heart failure) ([place]) 04/17/2016  . Septic olecranon bursitis of right elbow 09/19/2015  . Lactic acid acidosis   . Dilated cardiomyopathy ([place]) 04/19/2014  . Type 2 diabetes mellitus ([place]) 04/09/2014  . Non compliance with medical treatment and diet 04/09/2014  . Chronic anticoagulation-Eliquis started 04/06/14 04/09/2014  . Abnormal chest CT-LUL nodule- needs f/u May 2016 04/09/2014  . Acute on chronic combined systolic and diastolic heart failure ([place]) 04/05/2014  . OSA - C-pap 03/22/2013  . Persistent atrial fibrillation ([place]) 03/27/2011  . Obesity, Class III, BMI 40-49.9 (morbid obesity) ([place]) 03/27/2011  . URI, acute 03/27/2011    Current Outpatient Medications:  .  amiodarone (PACERONE) 200 MG tablet, TAKE 1 TABLET BY MOUTH ONCE DAILY, Disp: 34 tablet, Rfl: 2 .  apixaban (ELIQUIS) 5 MG TABS tablet, Take 1 tablet (5 mg total) by mouth 2 (two) times daily., Disp: 180 tablet, Rfl: 3 .  furosemide (LASIX) 40 MG tablet, TAKE 1 TABLET BY MOUTH ONCE DAILY, Disp: 100 tablet, Rfl: 2 .  metoprolol succinate (TOPROL-XL) 50 MG 24 hr tablet, Take 1.5 tabs Twice daily Take with or immediately following a meal., Disp: 90 tablet, Rfl: 6 .  sacubitril-valsartan (ENTRESTO) 97-103 MG, Take 1 tablet by mouth 2 (two) times daily., Disp: 180 tablet, Rfl: 3 .  spironolactone (ALDACTONE) 25 MG tablet, TAKE 1 TABLET BY MOUTH ONCE DAILY, Disp: 34 tablet, Rfl: 2 .  atorvastatin (LIPITOR) 80 MG tablet, TAKE 1 TABLET (80 MG TOTAL) BY MOUTH AT BEDTIME. (Patient not taking: Reported on  09/17/2017), Disp: 30 tablet, Rfl: 11 .  Krill Oil (OMEGA-3) 500 MG CAPS, Take 1 capsule by mouth daily., Disp: , Rfl:  .  metFORMIN (GLUCOPHAGE) 1000 MG tablet, TAKE 1 TABLET BY MOUTH TWICE A DAY WITH FOOD (Patient not taking: Reported on 09/17/2017), Disp: 60 tablet, Rfl: 3 No Known Allergies    Social History   Socioeconomic History  . Marital status: Single    Spouse name: Not on file  . Number of children: Not on file  . Years of education: Not on file  . Highest education level: Not on file  Occupational History  . Occupation: unemployeed  Social Needs  . Financial resource strain: Not on file  . Food insecurity:    Worry: Not on file    Inability: Not on file  . Transportation needs:    Medical: Not on file    Non-medical: Not on file  Tobacco Use  . Smoking status: Never Smoker  . Smokeless tobacco: Never Used  Substance and Sexual Activity  . Alcohol use: Yes    Comment: some  . Drug use: No  . Sexual activity: Never  Lifestyle  . Physical activity:    Days per week: Not on file    Minutes per session: Not on file  . Stress: Not on file  Relationships  . Social connections:    Talks on phone: Not on file    Gets together: Not on file    Attends religious service: Not on file    Active member of  club or organization: Not on file    Attends meetings of clubs or organizations: Not on file    Relationship status: Not on file  . Intimate partner violence:    Fear of current or ex partner: Not on file    Emotionally abused: Not on file    Physically abused: Not on file    Forced sexual activity: Not on file  Other Topics Concern  . Not on file  Social History Narrative   Admits that he eats poorly and eats a lot at work.      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    Physical Exam  Constitutional: He is oriented to person, place, and time.  Cardiovascular: Normal rate and regular rhythm.  Pulmonary/Chest: Effort normal and breath  sounds normal.  Abdominal: Soft.  Musculoskeletal: Normal range of motion. He exhibits edema.  Neurological: He is alert and oriented to person, place, and time.  Skin: Skin is warm and dry.  Psychiatric: He has a normal mood and affect.        Future Appointments  Date Time Provider [place]  12/07/2017  2:00 PM [name], FNP [facility] None    BP 110/62 (BP Location: Left Arm, Patient Position: Sitting, Cuff Size: Large)   Pulse 77   Resp 16   Wt (!) 309 lb (140.2 kg)   SpO2 96%   BMI 39.67 kg/m   Weight yesterday- did not weigh Last visit weight- 311 lb  [name] was [facility] at [facility] today and reported feeling well. He denied SOB, headache, dizziness or orthopnea. He reported being compliant with his medications however in the one bin with pills, he was missing metformin and atorvastatin. These medications were ordered at out last meeting and he was given instructions on where they go in his box. He reported that he picked them up at the pharmacy but has since lost the bag with three medications inside. These medications included metformin, atorvastatin and metoprolol. I contacted the pharmacy to ensure that he picked them up and they said the medications were picked up on 7/25. [name] said he would continue looking for his medications and felt confident that he would be able to find them. I gave him detailed written instructions on where the medications go in his pillbox once he finds them. To ensure he understood the instructions I had him read them and then show me where he was going to put the medicine once it was found. Additionally, [name] did not weigh himself since last seeing me 2 weeks ago. This has become an ongoing issue as well as diet compliance. I told [name] that if he shows up to our next appointment without having weighed himself I would be forced to discharge him because I cannot adequately monitor his fluid gain if he will not take responsibility  for himself. He countered by saying "Well you wouldn't know if I lied about it so I guess it's just the honor system." At his last clinic appointment [name] advised that Mr Blume' echocardiogram showed improvement and his EF was normal, thus I feel like discharge would be safe and given the circumstance, reasonable. I will confer with the clinic staff so they are aware of Mr Poblete standing.   Jacquiline Doe, EMT 09/17/17  ACTION: Home visit completed Next visit planned for 2 weeks

## 2017-09-29 ENCOUNTER — Telehealth (HOSPITAL_COMMUNITY): Payer: Self-pay | Admitting: *Deleted

## 2017-09-29 ENCOUNTER — Telehealth (HOSPITAL_COMMUNITY): Payer: Self-pay

## 2017-09-29 DIAGNOSIS — I509 Heart failure, unspecified: Secondary | ICD-10-CM

## 2017-09-29 NOTE — Telephone Encounter (Signed)
Result Notes for TSH   Notes recorded by Darron Doom, RN on 09/29/2017 at 10:18 AM EDT TSH, T3/T4 ordered. ------  Notes recorded by Darron Doom, RN on 09/29/2017 at 10:16 AM EDT Spoke with Thedore Mins who is going to see patient this Thursday, appt scheduled and he will notify patient. Will call back to reschedule if patient is unable to make lab appt. ------  Notes recorded by Harvie Junior, CMA on 09/16/2017 at 1:40 PM EDT Left VM for pt to call for lab results. ------  Notes recorded by [name], MD on 09/13/2017 at 11:20 PM EDT TSH low but T3 and T4 normal. Repeat TSH, free T3, and free T4.

## 2017-09-30 NOTE — Telephone Encounter (Signed)
I called Darius [name] to schedule an appointment and to let him know that he needs to come in to the clinic for follow up lab work. He stated he would be able to come in to the clinic at 12:00 on Friday and we agreed to meet on Thursday at 09:00 for his biweekly appointment.

## 2017-09-30 NOTE — Telephone Encounter (Signed)
I called Darius [name] to schedule an appointment and to let him know that he needs to come in to the clinic for follow up lab work. He did not answer so I left a message asking him to call me back.

## 2017-10-01 ENCOUNTER — Other Ambulatory Visit (HOSPITAL_COMMUNITY): Payer: Self-pay

## 2017-10-01 MED FILL — METOPROLOL SUCCINATE ER 50: 50 | 30 days supply | Qty: 90 | Fill #5

## 2017-10-01 MED FILL — ATORVASTATIN CALCIUM 80 MG: 80 | 30 days supply | Qty: 30 | Fill #6

## 2017-10-01 MED FILL — SPIRONOLACTONE 25 MG TABLET: 25 | 34 days supply | Qty: 34 | Fill #1

## 2017-10-01 MED FILL — AMIODARONE HCL 200 MG TAB: 200 | 30 days supply | Qty: 30 | Fill #3

## 2017-10-01 NOTE — Progress Notes (Signed)
Paramedicine Encounter    Patient ID: Darius [name], male    DOB: 12-20-1955, 62 y.o.   MRN: [id number]   Patient Care Team: Darius [name], Darius [name] as PCP - General (Family Medicine) Darius Dresser, MD as Consulting Physician (Cardiology)  Patient Active Problem List   Diagnosis Date Noted  . Urine leukocytes 05/24/2017  . Atrial fibrillation with RVR ([place]) 04/17/2016  . Elevated troponin 04/17/2016  . CHF (congestive heart failure) ([place]) 04/17/2016  . Septic olecranon bursitis of right elbow 09/19/2015  . Lactic acid acidosis   . Dilated cardiomyopathy ([place]) 04/19/2014  . Type 2 diabetes mellitus ([place]) 04/09/2014  . Non compliance with medical treatment and diet 04/09/2014  . Chronic anticoagulation-Eliquis started 04/06/14 04/09/2014  . Abnormal chest CT-LUL nodule- needs f/u May 2016 04/09/2014  . Acute on chronic combined systolic and diastolic heart failure ([place]) 04/05/2014  . OSA - C-pap 03/22/2013  . Persistent atrial fibrillation ([place]) 03/27/2011  . Obesity, Class III, BMI 40-49.9 (morbid obesity) ([place]) 03/27/2011  . URI, acute 03/27/2011    Current Outpatient Medications:  .  amiodarone (PACERONE) 200 MG tablet, TAKE 1 TABLET BY MOUTH ONCE DAILY, Disp: 34 tablet, Rfl: 2 .  apixaban (ELIQUIS) 5 MG TABS tablet, Take 1 tablet (5 mg total) by mouth 2 (two) times daily., Disp: 180 tablet, Rfl: 3 .  atorvastatin (LIPITOR) 80 MG tablet, TAKE 1 TABLET (80 MG TOTAL) BY MOUTH AT BEDTIME., Disp: 30 tablet, Rfl: 11 .  furosemide (LASIX) 40 MG tablet, TAKE 1 TABLET BY MOUTH ONCE DAILY, Disp: 100 tablet, Rfl: 2 .  metFORMIN (GLUCOPHAGE) 1000 MG tablet, TAKE 1 TABLET BY MOUTH TWICE A DAY WITH FOOD, Disp: 60 tablet, Rfl: 3 .  metoprolol succinate (TOPROL-XL) 50 MG 24 hr tablet, Take 1.5 tabs Twice daily Take with or immediately following a meal., Disp: 90 tablet, Rfl: 6 .  sacubitril-valsartan (ENTRESTO) 97-103 MG, Take 1 tablet by mouth 2 (two) times daily., Disp: 180 tablet, Rfl: 3 .   spironolactone (ALDACTONE) 25 MG tablet, TAKE 1 TABLET BY MOUTH ONCE DAILY, Disp: 34 tablet, Rfl: 2 .  Krill Oil (OMEGA-3) 500 MG CAPS, Take 1 capsule by mouth daily., Disp: , Rfl:  No Known Allergies    Social History   Socioeconomic History  . Marital status: Single    Spouse name: Not on file  . Number of children: Not on file  . Years of education: Not on file  . Highest education level: Not on file  Occupational History  . Occupation: unemployeed  Social Needs  . Financial resource strain: Not on file  . Food insecurity:    Worry: Not on file    Inability: Not on file  . Transportation needs:    Medical: Not on file    Non-medical: Not on file  Tobacco Use  . Smoking status: Never Smoker  . Smokeless tobacco: Never Used  Substance and Sexual Activity  . Alcohol use: Yes    Comment: some  . Drug use: No  . Sexual activity: Never  Lifestyle  . Physical activity:    Days per week: Not on file    Minutes per session: Not on file  . Stress: Not on file  Relationships  . Social connections:    Talks on phone: Not on file    Gets together: Not on file    Attends religious service: Not on file    Active member of club or organization: Not on file    Attends meetings of  clubs or organizations: Not on file    Relationship status: Not on file  . Intimate partner violence:    Fear of current or ex partner: Not on file    Emotionally abused: Not on file    Physically abused: Not on file    Forced sexual activity: Not on file  Other Topics Concern  . Not on file  Social History Narrative   Admits that he eats poorly and eats a lot at work.      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    Physical Exam  Constitutional: He is oriented to person, place, and time.  Cardiovascular: Normal rate.  Pulmonary/Chest: Effort normal and breath sounds normal.  Abdominal: Soft.  Musculoskeletal: Normal range of motion. He exhibits no edema.  Neurological:  He is alert and oriented to person, place, and time.  Skin: Skin is warm and dry.  Psychiatric: He has a normal mood and affect.        Future Appointments  Date Time Provider [place]  10/02/2017 12:00 PM [facility] None  10/13/2017  2:00 PM Darius Haw, MD [facility]  12/07/2017  2:00 PM Darius [name] [facility] None    BP 122/74 (BP Location: Left Arm, Patient Position: Sitting, Cuff Size: Large)   Pulse 85   Resp 16   Wt (!) 302 lb (137 kg)   SpO2 97%   BMI 38.77 kg/m   Weight yesterday- 303 lb Last visit weight- 309 lb  [name] was [facility] today and reported feeling well. He reported he had been compliant with his medications which was apparent from his empty pillbox. He has not been writing down his weights daily but tried to go back 2 weeks and remember each day and wrote them on an envelope. These weights did not seem to be accurate so I have asked him again to begin writing it down every day and he agreed. His medications were verified ad his pillbox was refilled.   Jacquiline Doe, EMT 10/01/17  ACTION: Home visit completed Next visit planned for 2 weeks

## 2017-10-02 ENCOUNTER — Ambulatory Visit (HOSPITAL_COMMUNITY)
Admission: RE | Admit: 2017-10-02 | Discharge: 2017-10-02 | Disposition: A | Discharge status: Home / Self Care | Payer: Medicaid Other | Source: Ambulatory Visit | Attending: Internal Medicine | Admitting: Internal Medicine

## 2017-10-02 DIAGNOSIS — I509 Heart failure, unspecified: Secondary | ICD-10-CM | POA: Insufficient documentation

## 2017-10-02 LAB — TSH: TSH: 0.083 u[IU]/mL — AB (ref 0.350–4.500)

## 2017-10-02 LAB — T4, FREE: Free T4: 1.18 ng/dL (ref 0.82–1.77)

## 2017-10-03 LAB — T3, FREE: T3 FREE: 3.8 pg/mL (ref 2.0–4.4)

## 2017-10-13 ENCOUNTER — Ambulatory Visit (INDEPENDENT_AMBULATORY_CARE_PROVIDER_SITE_OTHER): Payer: Medicaid Other | Admitting: Cardiology

## 2017-10-13 ENCOUNTER — Encounter: Payer: Self-pay | Admitting: Cardiology

## 2017-10-13 VITALS — BP 124/70 | HR 70 | Ht 74.0 in | Wt 306.0 lb

## 2017-10-13 DIAGNOSIS — G4733 Obstructive sleep apnea (adult) (pediatric): Secondary | ICD-10-CM | POA: Diagnosis not present

## 2017-10-13 DIAGNOSIS — E785 Hyperlipidemia, unspecified: Secondary | ICD-10-CM | POA: Diagnosis not present

## 2017-10-13 DIAGNOSIS — I4819 Other persistent atrial fibrillation: Secondary | ICD-10-CM

## 2017-10-13 DIAGNOSIS — I428 Other cardiomyopathies: Secondary | ICD-10-CM

## 2017-10-13 DIAGNOSIS — I481 Persistent atrial fibrillation: Secondary | ICD-10-CM | POA: Diagnosis not present

## 2017-10-13 NOTE — Patient Instructions (Addendum)
Medication Instructions:  Your physician recommends that you continue on your current medications as directed. Please refer to the Current Medication list given to you today.  * If you need a refill on your cardiac medications before your next appointment, please call your pharmacy.   Labwork: None ordered  Testing/Procedures: None ordered  Follow-Up: You have been referred to Dr. Radford Pax to follow you sleep apnea.  Your physician recommends that you schedule a follow-up appointment in: 3 months with Dr. Curt Bears.  *Please note that any paperwork needing to be filled out by the provider will need to be addressed at the front desk prior to seeing the provider. Please note that any FMLA, disability or other documents regarding health condition is subject to a $25.00 charge that must be received prior to completion of paperwork in the form of a money order or check.  Thank you for choosing [facility]!!   Trinidad Curet, RN [phone]  Any Other Special Instructions Will Be Listed Below (If Applicable).  Cardiac Ablation Cardiac ablation is a procedure to disable (ablate) a small amount of heart tissue in very specific places. The heart has many electrical connections. Sometimes these connections are abnormal and can cause the heart to beat very fast or irregularly. Ablating some of the problem areas can improve the heart rhythm or return it to normal. Ablation may be done for people who:  Have Wolff-Parkinson-White syndrome.  Have fast heart rhythms (tachycardia).  Have taken medicines for an abnormal heart rhythm (arrhythmia) that were not effective or caused side effects.  Have a high-risk heartbeat that may be life-threatening.  During the procedure, a small incision is made in the neck or the groin, and a long, thin, flexible tube (catheter) is inserted into the incision and moved to the heart. Small devices (electrodes) on the tip of the catheter will send out electrical  currents. A type of X-ray (fluoroscopy) will be used to help guide the catheter and to provide images of the heart. Tell a health care provider about:  Any allergies you have.  All medicines you are taking, including vitamins, herbs, eye drops, creams, and over-the-counter medicines.  Any problems you or family members have had with anesthetic medicines.  Any blood disorders you have.  Any surgeries you have had.  Any medical conditions you have, such as kidney failure.  Whether you are pregnant or may be pregnant. What are the risks? Generally, this is a safe procedure. However, problems may occur, including:  Infection.  Bruising and bleeding at the catheter insertion site.  Bleeding into the chest, especially into the sac that surrounds the heart. This is a serious complication.  Stroke or blood clots.  Damage to other structures or organs.  Allergic reaction to medicines or dyes.  Need for a permanent pacemaker if the normal electrical system is damaged. A pacemaker is a small computer that sends electrical signals to the heart and helps your heart beat normally.  The procedure not being fully effective. This may not be recognized until months later. Repeat ablation procedures are sometimes required.  What happens before the procedure?  Follow instructions from your health care provider about eating or drinking restrictions.  Ask your health care provider about: ? Changing or stopping your regular medicines. This is especially important if you are taking diabetes medicines or blood thinners. ? Taking medicines such as aspirin and ibuprofen. These medicines can thin your blood. Do not take these medicines before your procedure if your health care  provider instructs you not to.  Plan to have someone take you home from the hospital or clinic.  If you will be going home right after the procedure, plan to have someone with you for 24 hours. What happens during the  procedure?  To lower your risk of infection: ? Your health care team will wash or sanitize their hands. ? Your skin will be washed with soap. ? Hair may be removed from the incision area.  An IV tube will be inserted into one of your veins.  You will be given a medicine to help you relax (sedative).  The skin on your neck or groin will be numbed.  An incision will be made in your neck or your groin.  A needle will be inserted through the incision and into a large vein in your neck or groin.  A catheter will be inserted into the needle and moved to your heart.  Dye may be injected through the catheter to help your surgeon see the area of the heart that needs treatment.  Electrical currents will be sent from the catheter to ablate heart tissue in desired areas. There are three types of energy that may be used to ablate heart tissue: ? Heat (radiofrequency energy). ? Laser energy. ? Extreme cold (cryoablation).  When the necessary tissue has been ablated, the catheter will be removed.  Pressure will be held on the catheter insertion area to prevent excessive bleeding.  A bandage (dressing) will be placed over the catheter insertion area. The procedure may vary among health care providers and hospitals. What happens after the procedure?  Your blood pressure, heart rate, breathing rate, and blood oxygen level will be monitored until the medicines you were given have worn off.  Your catheter insertion area will be monitored for bleeding. You will need to lie still for a few hours to ensure that you do not bleed from the catheter insertion area.  Do not drive for 24 hours or as long as directed by your health care provider. Summary  Cardiac ablation is a procedure to disable (ablate) a small amount of heart tissue in very specific places. Ablating some of the problem areas can improve the heart rhythm or return it to normal.  During the procedure, electrical currents will be sent  from the catheter to ablate heart tissue in desired areas. This information is not intended to replace advice given to you by your health care provider. Make sure you discuss any questions you have with your health care provider. Document Released: 06/15/2008 Document Revised: 12/17/2015 Document Reviewed: 12/17/2015 Elsevier Interactive Patient Education  [employer].

## 2017-10-13 NOTE — Progress Notes (Signed)
Electrophysiology Office Note   Date:  10/13/2017   ID:  Darius [name], DOB 1955/06/17, MRN [id number]  PCP:  Scot Jun, FNP  Cardiologist:  [name] Primary Electrophysiologist:  Saige Canton Meredith Leeds, MD    No chief complaint on file.    History of Present Illness: Darius [name] is a 62 y.o. male who is being seen today for the evaluation of atrial fibrillation at the request of Loralie Champagne. Presenting today for electrophysiology evaluation.  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  04/2016 he was admitted for acute systolic heart failure and atrial fibrillation with rapid rates.  Right and left heart catheterization showed no coronary disease and elevated filling pressures.  Echo showed an EF of 15%.  He was found to have a left atrial appendage thrombus and was started on Eliquis.  He had a cardioversion 08/2016 with reverted to sinus rhythm.  Echo showed an EF of 40 to 45%.  Most recent echo shows a normal ejection fraction.    Today, he denies symptoms of palpitations, chest pain, shortness of breath, orthopnea, PND, lower extremity edema, claudication, dizziness, presyncope, syncope, bleeding, or neurologic sequela. The patient is tolerating medications without difficulties.    Past Medical History:  Diagnosis Date  . Atrial fibrillation ([place])    unable to tolerate versed and fentanyl sedation and required gen anesthesia for TEE;  s/p TEE-DCCV (failed);  Amiodarone started  . Chronic diastolic heart failure ([place])    a. Echo 03/27/11: EF 50-55%, inferior hypokinesis, moderate LAE, mild RVE, normal pulmonary pressures. ;   b.  TEE (04/03/11): EF 40-45%, mild MR moderate LAE, no LAA clot, mild RAE;  c.  Echocardiogram (02/2013): EF 60-65%, Gr 2 DD, mildly dilated Ao root (Ao root dimension 39 mm), MAC, mild LAE, normal RVF  . Diabetes mellitus, type 2 ([place])   .  Hx of cardiovascular stress test    Lexiscan Myoview (02/2013): No ischemia or scar, EF 51%, low risk  . Morbid obesity ([place])   . Sleep apnea    a. sleep test 01/2013:  very severy OSA   Past Surgical History:  Procedure Laterality Date  . CARDIOVERSION  04/01/2011   Procedure: CARDIOVERSION;  Surgeon: Lelon Perla, MD;  Location: [name];  Service: Cardiovascular;  Laterality: N/A;  . CARDIOVERSION  04/03/2011   Procedure: CARDIOVERSION;  Surgeon: Lelon Perla, MD;  Location: [place];  Service: Cardiovascular;  Laterality: N/A;  . CARDIOVERSION  04/03/2011   Procedure: CARDIOVERSION;  Surgeon: Lelon Perla, MD;  Location: [place];  Service: Cardiovascular;  Laterality: N/A;  . CARDIOVERSION N/A 06/15/2014   Procedure: CARDIOVERSION;  Surgeon: [name], MD;  Location: [facility] ENDOSCOPY;  Service: Cardiovascular;  Laterality: N/A;  . CARDIOVERSION N/A 09/08/2016   Procedure: CARDIOVERSION;  Surgeon: [name], MD;  Location: [facility] ENDOSCOPY;  Service: Cardiovascular;  Laterality: N/A;  . I&D EXTREMITY Right 09/19/2015   Procedure: IRRIGATION AND DEBRIDEMENT EXTREMITY;  Surgeon: Mcarthur Rossetti, MD;  Location: [place];  Service: Orthopedics;  Laterality: Right;  . RIGHT/LEFT HEART CATH AND CORONARY ANGIOGRAPHY N/A 04/21/2016   Procedure: Right/Left Heart Cath and Coronary Angiography;  Surgeon: [name], MD;  Location: [place] CV LAB;  Service: Cardiovascular;  Laterality: N/A;  . TEE WITHOUT CARDIOVERSION  04/01/2011   Procedure: TRANSESOPHAGEAL ECHOCARDIOGRAM (TEE);  Surgeon: Lelon Perla, MD;  Location: [place];  Service: Cardiovascular;  Laterality: N/A;  . TEE WITHOUT CARDIOVERSION  04/03/2011   Procedure: TRANSESOPHAGEAL ECHOCARDIOGRAM (TEE);  Surgeon: Lelon Perla, MD;  Location: [facility] ENDOSCOPY;  Service: Cardiovascular;  Laterality: N/A;  . TEE WITHOUT CARDIOVERSION  04/03/2011   Procedure: TRANSESOPHAGEAL ECHOCARDIOGRAM (TEE);  Surgeon: Lelon Perla, MD;  Location: [place];  Service: Cardiovascular;  Laterality: N/A;  . TEE WITHOUT CARDIOVERSION N/A 04/24/2016   Procedure: TRANSESOPHAGEAL ECHOCARDIOGRAM (TEE);  Surgeon: [name], MD;  Location: [place];  Service: Cardiovascular;  Laterality: N/A;  . TEE WITHOUT CARDIOVERSION N/A 05/26/2016   Procedure: TRANSESOPHAGEAL ECHOCARDIOGRAM (TEE);  Surgeon: [name], MD;  Location: [place];  Service: Cardiovascular;  Laterality: N/A;  . TEE WITHOUT CARDIOVERSION N/A 09/08/2016   Procedure: TRANSESOPHAGEAL ECHOCARDIOGRAM (TEE);  Surgeon: [name], MD;  Location[facility] ENDOSCOPY;  Service: Cardiovascular;  Laterality: N/A;     Current Outpatient Medications  Medication Sig Dispense Refill  . amiodarone (PACERONE) 200 MG tablet TAKE 1 TABLET BY MOUTH ONCE DAILY 34 tablet 2  . apixaban (ELIQUIS) 5 MG TABS tablet Take 1 tablet (5 mg total) by mouth 2 (two) times daily. 180 tablet 3  . atorvastatin (LIPITOR) 80 MG tablet TAKE 1 TABLET (80 MG TOTAL) BY MOUTH AT BEDTIME. 30 tablet 11  . furosemide (LASIX) 40 MG tablet TAKE 1 TABLET BY MOUTH ONCE DAILY 100 tablet 2  . metFORMIN (GLUCOPHAGE) 1000 MG tablet TAKE 1 TABLET BY MOUTH TWICE A DAY WITH FOOD 60 tablet 3  . metoprolol succinate (TOPROL-XL) 50 MG 24 hr tablet Take 1.5 tabs Twice daily Take with or immediately following a meal. 90 tablet 6  . sacubitril-valsartan (ENTRESTO) 97-103 MG Take 1 tablet by mouth 2 (two) times daily. 180 tablet 3  . spironolactone (ALDACTONE) 25 MG tablet TAKE 1 TABLET BY MOUTH ONCE DAILY 34 tablet 2   No current facility-administered medications for this visit.     Allergies:   Patient has no known allergies.   Social History:  The patient  reports that he has never smoked. He has never used smokeless tobacco. He reports that he drinks alcohol. He reports that he does not use drugs.   Family History:  The patient's family history includes Diabetes in his father; Hypertension in his  father, paternal grandfather, and sister; Seizures in his mother.    ROS:  Please see the history of present illness.   Otherwise, review of systems is positive for snoring.   All other systems are reviewed and negative.    PHYSICAL EXAM: VS:  BP 124/70   Pulse 70   Ht _0  (1.88 m)   Wt (!) 306 lb (138.8 kg)   BMI 39.29 kg/m  , BMI Body mass index is 39.29 kg/m. GEN: Well nourished, well developed, in no acute distress  HEENT: normal  Neck: no JVD, carotid bruits, or masses Cardiac: RRR; no murmurs, rubs, or gallops,no edema  Respiratory:  clear to auscultation bilaterally, normal work of breathing GI: soft, nontender, nondistended, + BS MS: no deformity or atrophy  Skin: warm and dry Neuro:  Strength and sensation are intact Psych: euthymic mood, full affect  EKG:  EKG is ordered today. Personal review of the ekg ordered shows rate 70  Recent Labs: 11/12/2016: Hemoglobin 14.4; Platelets 222 02/13/2017: BNP 129.4 09/11/2017: ALT 23; BUN 17; Creatinine, Ser 1.16; Potassium 4.1; Sodium 140 10/02/2017: TSH 0.083    Lipid Panel     Component Value Date/Time   CHOL 120 12/12/2016 0928  TRIG 77 12/12/2016 0928   HDL 62 12/12/2016 0928   CHOLHDL 1.9 12/12/2016 0928   VLDL 17 04/18/2016 1459   LDLCALC 42 12/12/2016 0928     Wt Readings from Last 3 Encounters:  10/13/17 (!) 306 lb (138.8 kg)  10/01/17 (!) 302 lb (137 kg)  09/17/17 (!) 309 lb (140.2 kg)      Other studies Reviewed: Additional studies/ records that were reviewed today include: TTE 09/11/17  Review of the above records today demonstrates:  - Left ventricle: The cavity size was normal. Wall thickness was   increased in a pattern of mild LVH. Systolic function was normal.   The estimated ejection fraction was in the range of 55% to 60%.   Although no diagnostic regional wall motion abnormality was   identified, this possibility cannot be completely excluded on the   basis of this study. GLS -19.1%. -  Aortic valve: There was no stenosis. - Mitral valve: There was no significant regurgitation. - Right ventricle: The cavity size was normal. Systolic function   was normal. - Pulmonary arteries: No complete TR doppler jet so unable to   estimate PA systolic pressure. - Inferior vena cava: The vessel was normal in size. The   respirophasic diameter changes were in the normal range (>= 50%),   consistent with normal central venous pressure.   ASSESSMENT AND PLAN:  1.  Persistent atrial fibrillation: Currently on amiodarone and Eliquis.  He is currently in sinus rhythm and is without complaint.  At this point, he is interested in ablation potentially.  Unfortunately he is not compliant with his CPAP.  I have told him that CPAP compliance is necessary for effective treatment for atrial fibrillation.  Due to that, I Daanya Lanphier refer him to sleep medicine and we Dravin Lance see him back after their appointment.  This patients CHA2DS2-VASc Score and unadjusted Ischemic Stroke Rate (% per year) is equal to 2.2 % stroke rate/year from a score of 2  Above score calculated as 1 point each if present [CHF, HTN, DM, Vascular=MI/PAD/Aortic Plaque, Age if 65-74, or Male] Above score calculated as 2 points each if present [Age > 75, or Stroke/TIA/TE]  2.  Chronic systolic heart failure: Minimal coronary artery disease.  Possibly due to tachycardia mediated cardiomyopathy as his ejection fraction has improved with return to sinus rhythm.  Continue Entresto, Aldactone, Toprol-XL  3.  Hyperlipidemia: Continue statin.  4.  Obstructive sleep apnea: CPAP but is noncompliant.  Kael Keetch refer to sleep medicine.  Current medicines are reviewed at length with the patient today.   The patient does not have concerns regarding his medicines.  The following changes were made today:  none  Labs/ tests ordered today include:  Orders Placed This Encounter  Procedures  . Ambulatory referral to Sleep Studies  . EKG 12-Lead    Discussed with primary cardiology  Disposition:   FU with Jeweliana Dudgeon 3 months  Signed, Maira Christon Meredith Leeds, MD  10/13/2017 2:44 PM     [facility] [address] [place] [address] [phone] (office) [phone] (fax)

## 2017-10-14 ENCOUNTER — Telehealth: Payer: Self-pay | Admitting: *Deleted

## 2017-10-14 NOTE — Telephone Encounter (Signed)
-----   Message from Stanton Kidney, RN sent at 10/13/2017  2:51 PM EDT ----- Regarding: Ref to [facility] for OSA f/u I placed sleep referral to Dr. Radford Pax.  I am not sure if I ordered right b/c pt does not need sleep study - he has had one.   He simply needs to be follow by someone for his sleep apnea. He is non-compliant w/ CPAP.  Pt was not scheduled to establish w/ Dr. Radford Pax for OSA until you reviewed chart and determined what is needed before pt sees her (so he will need at appt scheduled w/ her)  Please let me know if I did this wrong so that I avoid mistake in the future.  Thanks Trinidad Curet, RN

## 2017-10-14 NOTE — Telephone Encounter (Signed)
Patient is scheduled for a sleep referral appointment on 12/17/17 AT 11:20 with Dr Radford Pax. Pt is aware and agreeable to treatment.

## 2017-10-15 ENCOUNTER — Telehealth (HOSPITAL_COMMUNITY): Payer: Self-pay

## 2017-10-15 ENCOUNTER — Other Ambulatory Visit (HOSPITAL_COMMUNITY): Payer: Self-pay

## 2017-10-15 ENCOUNTER — Other Ambulatory Visit: Payer: Self-pay | Admitting: *Deleted

## 2017-10-15 DIAGNOSIS — G4733 Obstructive sleep apnea (adult) (pediatric): Secondary | ICD-10-CM

## 2017-10-15 MED FILL — metFORMIN HCL 1000 MG TABS: 1000 | 30 days supply | Qty: 60 | Fill #1

## 2017-10-15 NOTE — Progress Notes (Signed)
Paramedicine Encounter    Patient ID: Darius [name], male    DOB: 09-05-1955, 62 y.o.   MRN: [id number]   Patient Care Team: Darius Jun, FNP as PCP - General (Family Medicine) Darius Dresser, MD as Consulting Physician (Cardiology)  Patient Active Problem List   Diagnosis Date Noted  . Urine leukocytes 05/24/2017  . Atrial fibrillation with RVR ([place]) 04/17/2016  . Elevated troponin 04/17/2016  . CHF (congestive heart failure) ([place]) 04/17/2016  . Septic olecranon bursitis of right elbow 09/19/2015  . Lactic acid acidosis   . Dilated cardiomyopathy ([place]) 04/19/2014  . Type 2 diabetes mellitus ([place]) 04/09/2014  . Non compliance with medical treatment and diet 04/09/2014  . Chronic anticoagulation-Eliquis started 04/06/14 04/09/2014  . Abnormal chest CT-LUL nodule- needs f/u May 2016 04/09/2014  . Acute on chronic combined systolic and diastolic heart failure ([place]) 04/05/2014  . OSA - C-pap 03/22/2013  . Persistent atrial fibrillation ([place]) 03/27/2011  . Obesity, Class III, BMI 40-49.9 (morbid obesity) ([place]) 03/27/2011  . URI, acute 03/27/2011    Current Outpatient Medications:  .  amiodarone (PACERONE) 200 MG tablet, TAKE 1 TABLET BY MOUTH ONCE DAILY, Disp: 34 tablet, Rfl: 2 .  apixaban (ELIQUIS) 5 MG TABS tablet, Take 1 tablet (5 mg total) by mouth 2 (two) times daily., Disp: 180 tablet, Rfl: 3 .  atorvastatin (LIPITOR) 80 MG tablet, TAKE 1 TABLET (80 MG TOTAL) BY MOUTH AT BEDTIME., Disp: 30 tablet, Rfl: 11 .  furosemide (LASIX) 40 MG tablet, TAKE 1 TABLET BY MOUTH ONCE DAILY, Disp: 100 tablet, Rfl: 2 .  metFORMIN (GLUCOPHAGE) 1000 MG tablet, TAKE 1 TABLET BY MOUTH TWICE A DAY WITH FOOD, Disp: 60 tablet, Rfl: 3 .  metoprolol succinate (TOPROL-XL) 50 MG 24 hr tablet, Take 1.5 tabs Twice daily Take with or immediately following a meal., Disp: 90 tablet, Rfl: 6 .  sacubitril-valsartan (ENTRESTO) 97-103 MG, Take 1 tablet by mouth 2 (two) times daily., Disp: 180 tablet, Rfl:  3 .  spironolactone (ALDACTONE) 25 MG tablet, TAKE 1 TABLET BY MOUTH ONCE DAILY, Disp: 34 tablet, Rfl: 2 No Known Allergies    Social History   Socioeconomic History  . Marital status: Single    Spouse name: Not on file  . Number of children: Not on file  . Years of education: Not on file  . Highest education level: Not on file  Occupational History  . Occupation: unemployeed  Social Needs  . Financial resource strain: Not on file  . Food insecurity:    Worry: Not on file    Inability: Not on file  . Transportation needs:    Medical: Not on file    Non-medical: Not on file  Tobacco Use  . Smoking status: Never Smoker  . Smokeless tobacco: Never Used  Substance and Sexual Activity  . Alcohol use: Yes    Comment: some  . Drug use: No  . Sexual activity: Never  Lifestyle  . Physical activity:    Days per week: Not on file    Minutes per session: Not on file  . Stress: Not on file  Relationships  . Social connections:    Talks on phone: Not on file    Gets together: Not on file    Attends religious service: Not on file    Active member of club or organization: Not on file    Attends meetings of clubs or organizations: Not on file    Relationship status: Not on file  . Intimate  partner violence:    Fear of current or ex partner: Not on file    Emotionally abused: Not on file    Physically abused: Not on file    Forced sexual activity: Not on file  Other Topics Concern  . Not on file  Social History Narrative   Admits that he eats poorly and eats a lot at work.      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    Physical Exam  Constitutional: He is oriented to person, place, and time.  Cardiovascular: Normal rate and regular rhythm.  Pulmonary/Chest: Effort normal and breath sounds normal.  Abdominal: Soft.  Musculoskeletal: Normal range of motion. He exhibits no edema.  Neurological: He is alert and oriented to person, place, and time.   Skin: Skin is warm and dry.  Psychiatric: He has a normal mood and affect.        Future Appointments  Date Time Provider [place]  12/07/2017  2:00 PM Darius [name], [place] [facility] None  12/17/2017 11:20 AM Darius Margarita, [facility]  01/13/2018 11:30 AM Darius [name], Darius Doyne, MD [facility]    BP 122/84 (BP Location: Left Arm, Patient Position: Sitting, Cuff Size: Large)   Pulse 84   Resp 16   Wt (!) 304 lb (137.9 kg)   SpO2 94%   BMI 39.03 kg/m   Weight yesterday- 304 lb Last visit weight- 302 lb  [facility] [name] and reported feeling well. He has been complaint with his medications but continues to have poor dietary habits. Based off his weight chart, his weight is fluctuating significantly due to his dietary choices but this is an ongoing issue. His medications were verified and his pillbox was refilled. I gave him information regarding the [place] Medicaid changes that are coming and instructed him how to go about signing up for one of the new plans. These instructions were also on the papers I gave him.   Darius [name], EMT 10/15/17  ACTION: Home visit completed Next visit planned for 2 weeks

## 2017-10-15 NOTE — Telephone Encounter (Signed)
Darius [name] called me to advise he would be available this afternoon to meet. We agreed to meet at 14:00.

## 2017-10-15 NOTE — Telephone Encounter (Signed)
I called Darius [name] to schedule an appointment. He did not answer so I left a voicemail requesting he call me back.

## 2017-10-28 ENCOUNTER — Telehealth (HOSPITAL_COMMUNITY): Payer: Self-pay

## 2017-10-29 ENCOUNTER — Other Ambulatory Visit (HOSPITAL_COMMUNITY): Payer: Self-pay

## 2017-10-29 ENCOUNTER — Other Ambulatory Visit (HOSPITAL_COMMUNITY): Payer: Self-pay | Admitting: Pharmacist

## 2017-10-29 MED ORDER — APIXABAN 5 MG PO TABS: 5.0000 mg | ORAL_TABLET | Freq: Two times a day (BID) | ORAL | 3 refills | Status: DC

## 2017-10-29 NOTE — Telephone Encounter (Signed)
Called Mr Landau to schedule an appointment. He did not answer so I left a voicemail requesting he call me back.

## 2017-10-29 NOTE — Progress Notes (Signed)
Paramedicine Encounter    Patient ID: Darius [name], male    DOB: [birth year], 62 y.o.   MRN: [id number]   Patient Care Team: Darius Jun, FNP as PCP - General (Family Medicine) Darius Dresser, MD as Consulting Physician (Cardiology)  Patient Active Problem List   Diagnosis Date Noted  . Urine leukocytes 05/24/2017  . Atrial fibrillation with RVR ([place]) 04/17/2016  . Elevated troponin 04/17/2016  . CHF (congestive heart failure) ([place]) 04/17/2016  . Septic olecranon bursitis of right elbow 09/19/2015  . Lactic acid acidosis   . Dilated cardiomyopathy ([place]) 04/19/2014  . Type 2 diabetes mellitus ([place]) 04/09/2014  . Non compliance with medical treatment and diet 04/09/2014  . Chronic anticoagulation-Eliquis started 04/06/14 04/09/2014  . Abnormal chest CT-LUL nodule- needs f/u May 2016 04/09/2014  . Acute on chronic combined systolic and diastolic heart failure ([place]) 04/05/2014  . OSA - C-pap 03/22/2013  . Persistent atrial fibrillation ([place]) 03/27/2011  . Obesity, Class III, BMI 40-49.9 (morbid obesity) ([place]) 03/27/2011  . URI, acute 03/27/2011    Current Outpatient Medications:  .  amiodarone (PACERONE) 200 MG tablet, TAKE 1 TABLET BY MOUTH ONCE DAILY, Disp: 34 tablet, Rfl: 2 .  atorvastatin (LIPITOR) 80 MG tablet, TAKE 1 TABLET (80 MG TOTAL) BY MOUTH AT BEDTIME., Disp: 30 tablet, Rfl: 11 .  furosemide (LASIX) 40 MG tablet, TAKE 1 TABLET BY MOUTH ONCE DAILY, Disp: 100 tablet, Rfl: 2 .  metFORMIN (GLUCOPHAGE) 1000 MG tablet, TAKE 1 TABLET BY MOUTH TWICE A DAY WITH FOOD, Disp: 60 tablet, Rfl: 3 .  metoprolol succinate (TOPROL-XL) 50 MG 24 hr tablet, Take 1.5 tabs Twice daily Take with or immediately following a meal., Disp: 90 tablet, Rfl: 6 .  sacubitril-valsartan (ENTRESTO) 97-103 MG, Take 1 tablet by mouth 2 (two) times daily., Disp: 180 tablet, Rfl: 3 .  spironolactone (ALDACTONE) 25 MG tablet, TAKE 1 TABLET BY MOUTH ONCE DAILY, Disp: 34 tablet, Rfl: 2 .  apixaban  (ELIQUIS) 5 MG TABS tablet, Take 1 tablet (5 mg total) by mouth 2 (two) times daily., Disp: 180 tablet, Rfl: 3 No Known Allergies    Social History   Socioeconomic History  . Marital status: Single    Spouse name: Not on file  . Number of children: Not on file  . Years of education: Not on file  . Highest education level: Not on file  Occupational History  . Occupation: unemployeed  Social Needs  . Financial resource strain: Not on file  . Food insecurity:    Worry: Not on file    Inability: Not on file  . Transportation needs:    Medical: Not on file    Non-medical: Not on file  Tobacco Use  . Smoking status: Never Smoker  . Smokeless tobacco: Never Used  Substance and Sexual Activity  . Alcohol use: Yes    Comment: some  . Drug use: No  . Sexual activity: Never  Lifestyle  . Physical activity:    Days per week: Not on file    Minutes per session: Not on file  . Stress: Not on file  Relationships  . Social connections:    Talks on phone: Not on file    Gets together: Not on file    Attends religious service: Not on file    Active member of club or organization: Not on file    Attends meetings of clubs or organizations: Not on file    Relationship status: Not on file  . Intimate  partner violence:    Fear of current or ex partner: Not on file    Emotionally abused: Not on file    Physically abused: Not on file    Forced sexual activity: Not on file  Other Topics Concern  . Not on file  Social History Narrative   Admits that he eats poorly and eats a lot at work.      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    Physical Exam  Constitutional: He is oriented to person, place, and time.  Cardiovascular: Normal rate.  Pulmonary/Chest: Effort normal and breath sounds normal.  Abdominal: Soft.  Musculoskeletal: Normal range of motion.  Neurological: He is alert and oriented to person, place, and time.  Skin: Skin is warm and dry.   Psychiatric: He has a normal mood and affect.        Future Appointments  Date Time Provider [place]  12/07/2017  2:00 PM [name], [place] [facility] None  12/17/2017 11:20 AM Sueanne Margarita, MD [facility]  01/13/2018 11:30 AM Camnitz, Ocie Doyne, MD [facility]    BP 100/70 (BP Location: Left Arm, Patient Position: Sitting, Cuff Size: Large)   Pulse 67   Resp 16   Wt (!) 310 lb (140.6 kg)   SpO2 96%   BMI 39.80 kg/m   Weight yesterday- 312 lb Last visit weight- 304 lb   Mr Darius [name] was seen at home today and reported feeling well. He stated he has been compliant with his medications however he was not following a low sodium diet. He reported an increased weight if 6 pounds over the past two weeks but he denied SOB, headache, dizziness or orthopnea. He said he has been eating more since getting paid and said that is the reason for his weight gain. He stated he had not called to sign up for his new Medicaid plan yet because he did not know who to call. I had previously given him the necessary information and he still had the papers. I starred and underlined the steps he needs to follow in order to get set up. He said he did not have his PCP's phone number and did not know how to get it. I asked him to speak with [facility], who was present, regarding this since she referred him to that clinic. His medications were verified and his pillbox was refilled.   Darius [name], EMT 10/29/17  ACTION: Home visit completed Next visit planned for 2 weeks

## 2017-10-29 NOTE — Progress Notes (Signed)
Darius [name] wanted information from the [facility] for changing medicaid plans.  He already has a pcp but he wanted to know which plans were accepted by the [facility]. Fax was sent over to the clinic due to them being closed. She stated that I will get a f/u call tomorrow. Pt was on scene during the conversation and understood the information given.

## 2017-11-11 ENCOUNTER — Telehealth (HOSPITAL_COMMUNITY): Payer: Self-pay

## 2017-11-11 NOTE — Telephone Encounter (Signed)
I called Darius [name] to schedule an appointment. He did not answer so I left a voicemail requesting he call me back.

## 2017-11-12 ENCOUNTER — Other Ambulatory Visit (HOSPITAL_COMMUNITY): Payer: Self-pay

## 2017-11-12 ENCOUNTER — Telehealth: Payer: Self-pay | Admitting: Cardiology

## 2017-11-12 MED FILL — SPIRONOLACTONE 25 MG TABLET: 25 | 34 days supply | Qty: 34 | Fill #2

## 2017-11-12 MED FILL — ATORVASTATIN CALCIUM 80 MG: 80 | 30 days supply | Qty: 30 | Fill #7

## 2017-11-12 MED FILL — metFORMIN HCL 1000 MG TABS: 1000 | 30 days supply | Qty: 60 | Fill #2

## 2017-11-12 MED FILL — AMIODARONE HCL 200 MG TAB: 200 | 30 days supply | Qty: 30 | Fill #4

## 2017-11-12 MED FILL — FUROSEMIDE 40 MG TAB: 40 | 34 days supply | Qty: 100 | Fill #1

## 2017-11-12 MED FILL — METOPROLOL SUCCINATE ER 50: 50 | 30 days supply | Qty: 90 | Fill #6

## 2017-11-12 NOTE — Telephone Encounter (Signed)
Follow up   Patient was confused by the paperwork you gave him, he thought a heart cath and ablation were the same thing.  He will discuss it with Dr Curt Bears on his next appt

## 2017-11-12 NOTE — Progress Notes (Signed)
Paramedicine Encounter    Patient ID: Darius [name], male    DOB: 06-Nov-1955, 62 y.o.   MRN: [id number]   Patient Care Team: Darius Jun, FNP as PCP - General (Family Medicine) Darius Haw, MD as PCP - Electrophysiology (Cardiology) Darius Dresser, MD as Consulting Physician (Cardiology)  Patient Active Problem List   Diagnosis Date Noted  . Urine leukocytes 05/24/2017  . Atrial fibrillation with RVR ([place]) 04/17/2016  . Elevated troponin 04/17/2016  . CHF (congestive heart failure) ([place]) 04/17/2016  . Septic olecranon bursitis of right elbow 09/19/2015  . Lactic acid acidosis   . Dilated cardiomyopathy ([place]) 04/19/2014  . Type 2 diabetes mellitus ([place]) 04/09/2014  . Non compliance with medical treatment and diet 04/09/2014  . Chronic anticoagulation-Eliquis started 04/06/14 04/09/2014  . Abnormal chest CT-LUL nodule- needs f/u May 2016 04/09/2014  . Acute on chronic combined systolic and diastolic heart failure ([place]) 04/05/2014  . OSA - C-pap 03/22/2013  . Persistent atrial fibrillation ([place]) 03/27/2011  . Obesity, Class III, BMI 40-49.9 (morbid obesity) ([place]) 03/27/2011  . URI, acute 03/27/2011    Current Outpatient Medications:  .  amiodarone (PACERONE) 200 MG tablet, TAKE 1 TABLET BY MOUTH ONCE DAILY, Disp: 34 tablet, Rfl: 2 .  apixaban (ELIQUIS) 5 MG TABS tablet, Take 1 tablet (5 mg total) by mouth 2 (two) times daily., Disp: 180 tablet, Rfl: 3 .  atorvastatin (LIPITOR) 80 MG tablet, TAKE 1 TABLET (80 MG TOTAL) BY MOUTH AT BEDTIME., Disp: 30 tablet, Rfl: 11 .  furosemide (LASIX) 40 MG tablet, TAKE 1 TABLET BY MOUTH ONCE DAILY, Disp: 100 tablet, Rfl: 2 .  metFORMIN (GLUCOPHAGE) 1000 MG tablet, TAKE 1 TABLET BY MOUTH TWICE A DAY WITH FOOD, Disp: 60 tablet, Rfl: 3 .  metoprolol succinate (TOPROL-XL) 50 MG 24 hr tablet, Take 1.5 tabs Twice daily Take with or immediately following a meal., Disp: 90 tablet, Rfl: 6 .  sacubitril-valsartan (ENTRESTO) 97-103 MG, Take  1 tablet by mouth 2 (two) times daily., Disp: 180 tablet, Rfl: 3 .  spironolactone (ALDACTONE) 25 MG tablet, TAKE 1 TABLET BY MOUTH ONCE DAILY, Disp: 34 tablet, Rfl: 2 No Known Allergies    Social History   Socioeconomic History  . Marital status: Single    Spouse name: Not on file  . Number of children: Not on file  . Years of education: Not on file  . Highest education level: Not on file  Occupational History  . Occupation: unemployeed  Social Needs  . Financial resource strain: Not on file  . Food insecurity:    Worry: Not on file    Inability: Not on file  . Transportation needs:    Medical: Not on file    Non-medical: Not on file  Tobacco Use  . Smoking status: Never Smoker  . Smokeless tobacco: Never Used  Substance and Sexual Activity  . Alcohol use: Yes    Comment: some  . Drug use: No  . Sexual activity: Never  Lifestyle  . Physical activity:    Days per week: Not on file    Minutes per session: Not on file  . Stress: Not on file  Relationships  . Social connections:    Talks on phone: Not on file    Gets together: Not on file    Attends religious service: Not on file    Active member of club or organization: Not on file    Attends meetings of clubs or organizations: Not on file  Relationship status: Not on file  . Intimate partner violence:    Fear of current or ex partner: Not on file    Emotionally abused: Not on file    Physically abused: Not on file    Forced sexual activity: Not on file  Other Topics Concern  . Not on file  Social History Narrative   Admits that he eats poorly and eats a lot at work.      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    Physical Exam  Constitutional: He is oriented to person, place, and time.  Cardiovascular: Normal rate and regular rhythm.  Pulmonary/Chest: Effort normal and breath sounds normal.  Musculoskeletal: Normal range of motion. He exhibits no edema.  Neurological: He is alert  and oriented to person, place, and time.  Skin: Skin is warm and dry.  Psychiatric: He has a normal mood and affect.        Future Appointments  Date Time Provider [place]  12/07/2017  2:00 PM Darius [name], [place] [facility] None  12/17/2017 11:20 AM Darius Margarita, MD [facility]  01/13/2018 11:30 AM Camnitz, Ocie Doyne, MD [facility]    BP 100/70 (BP Location: Left Arm, Patient Position: Sitting, Cuff Size: Large)   Pulse 65   Resp 16   Wt (!) 311 lb (141.1 kg)   SpO2 95%   BMI 39.93 kg/m   Weight yesterday- 311 lb Last visit weight- 310 lb  [name] and reported feeling well. He stated he has been compliant with his medications which appeared to be accurate according to his pillbox. He denied SOB, headache, dizziness or orthopnea. He had not called the Medicaid enrollment number which I gave him two weeks ago because he thought "it would be easier to just have them do it." I explained to Darius [name] that he has to take responsibility for his own healthcare and that calling the Medicaid enrollment number, which I provided, starred and underlined, was part that. He said he understood but asked if we could call for him while he was present. I do not think Darius [name] is unable to carry out these tasks, rather I don't believe he is willing to put forth any effort to take charge of his own care. Darius [name] was with me during this meeting so she helped Darius [name] call and get enrolled in his new Medicaid program. His medications were verified and his pillbox was refilled. He ran out of eliquis and he is no longer eligible for the assistance program because he has Medicaid. I made Darius [name] aware and I believe he will be able to pick it up at the outpatient pharmacy. I will follow up with him next week to ensure he has picked it up.   Darius [name], EMT 11/12/17  ACTION: Home visit completed Next visit planned for 1  week

## 2017-11-12 NOTE — Telephone Encounter (Signed)
New message    Please call the patient about scheduling heart cath?patient walked in to schedule?

## 2017-11-13 ENCOUNTER — Telehealth (HOSPITAL_COMMUNITY): Payer: Self-pay | Admitting: Pharmacist

## 2017-11-13 ENCOUNTER — Other Ambulatory Visit (HOSPITAL_COMMUNITY): Payer: Self-pay | Admitting: Pharmacist

## 2017-11-13 MED ORDER — APIXABAN 5 MG PO TABS: 5.0000 mg | ORAL_TABLET | Freq: Two times a day (BID) | ORAL | 5 refills | Status: DC

## 2017-11-13 NOTE — Telephone Encounter (Signed)
Entresto PA approved by [place] Medicaid through 11/07/18.   Ruta Hinds. Velva Harman, PharmD, BCPS, CPP Clinical Pharmacist Phone: [phone] 11/13/2017 9:07 AM

## 2017-11-24 ENCOUNTER — Telehealth (HOSPITAL_COMMUNITY): Payer: Self-pay

## 2017-11-24 NOTE — Telephone Encounter (Signed)
I called Darius [name] to schedule an appointment. He stated he would be available to meet on Thursday at 15:30 so we agreed on that [facility].

## 2017-11-26 ENCOUNTER — Other Ambulatory Visit (HOSPITAL_COMMUNITY): Payer: Self-pay

## 2017-11-26 MED FILL — ELIQUIS 5 MG TABLET: 5 | 30 days supply | Qty: 60 | Fill #0

## 2017-11-26 NOTE — Progress Notes (Signed)
Paramedicine Encounter    Patient ID: Brandin Stetzer, male    DOB: 03/21/1955, 62 y.o.   MRN: [id number]   Patient Care Team: Scot Jun, FNP as PCP - General (Family Medicine) Constance Haw, MD as PCP - Electrophysiology (Cardiology) [name], MD as Consulting Physician (Cardiology)  Patient Active Problem List   Diagnosis Date Noted  . Urine leukocytes 05/24/2017  . Atrial fibrillation with RVR ([place]) 04/17/2016  . Elevated troponin 04/17/2016  . CHF (congestive heart failure) ([place]) 04/17/2016  . Septic olecranon bursitis of right elbow 09/19/2015  . Lactic acid acidosis   . Dilated cardiomyopathy ([place]) 04/19/2014  . Type 2 diabetes mellitus ([place]) 04/09/2014  . Non compliance with medical treatment and diet 04/09/2014  . Chronic anticoagulation-Eliquis started 04/06/14 04/09/2014  . Abnormal chest CT-LUL nodule- needs f/u May 2016 04/09/2014  . Acute on chronic combined systolic and diastolic heart failure ([place]) 04/05/2014  . OSA - C-pap 03/22/2013  . Persistent atrial fibrillation ([place]) 03/27/2011  . Obesity, Class III, BMI 40-49.9 (morbid obesity) ([place]) 03/27/2011  . URI, acute 03/27/2011    Current Outpatient Medications:  .  amiodarone (PACERONE) 200 MG tablet, TAKE 1 TABLET BY MOUTH ONCE DAILY, Disp: 34 tablet, Rfl: 2 .  apixaban (ELIQUIS) 5 MG TABS tablet, Take 1 tablet (5 mg total) by mouth 2 (two) times daily., Disp: 60 tablet, Rfl: 5 .  atorvastatin (LIPITOR) 80 MG tablet, TAKE 1 TABLET (80 MG TOTAL) BY MOUTH AT BEDTIME., Disp: 30 tablet, Rfl: 11 .  furosemide (LASIX) 40 MG tablet, TAKE 1 TABLET BY MOUTH ONCE DAILY, Disp: 100 tablet, Rfl: 2 .  metFORMIN (GLUCOPHAGE) 1000 MG tablet, TAKE 1 TABLET BY MOUTH TWICE A DAY WITH FOOD, Disp: 60 tablet, Rfl: 3 .  metoprolol succinate (TOPROL-XL) 50 MG 24 hr tablet, Take 1.5 tabs Twice daily Take with or immediately following a meal., Disp: 90 tablet, Rfl: 6 .  sacubitril-valsartan (ENTRESTO) 97-103 MG, Take 1  tablet by mouth 2 (two) times daily., Disp: 180 tablet, Rfl: 3 .  spironolactone (ALDACTONE) 25 MG tablet, TAKE 1 TABLET BY MOUTH ONCE DAILY, Disp: 34 tablet, Rfl: 2 No Known Allergies    Social History   Socioeconomic History  . Marital status: Single    Spouse name: Not on file  . Number of children: Not on file  . Years of education: Not on file  . Highest education level: Not on file  Occupational History  . Occupation: unemployeed  Social Needs  . Financial resource strain: Not on file  . Food insecurity:    Worry: Not on file    Inability: Not on file  . Transportation needs:    Medical: Not on file    Non-medical: Not on file  Tobacco Use  . Smoking status: Never Smoker  . Smokeless tobacco: Never Used  Substance and Sexual Activity  . Alcohol use: Yes    Comment: some  . Drug use: No  . Sexual activity: Never  Lifestyle  . Physical activity:    Days per week: Not on file    Minutes per session: Not on file  . Stress: Not on file  Relationships  . Social connections:    Talks on phone: Not on file    Gets together: Not on file    Attends religious service: Not on file    Active member of club or organization: Not on file    Attends meetings of clubs or organizations: Not on file  Relationship status: Not on file  . Intimate partner violence:    Fear of current or ex partner: Not on file    Emotionally abused: Not on file    Physically abused: Not on file    Forced sexual activity: Not on file  Other Topics Concern  . Not on file  Social History Narrative   Admits that he eats poorly and eats a lot at work.      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    Physical Exam  Constitutional: He is oriented to person, place, and time.  Cardiovascular: Normal rate.  Pulmonary/Chest: Effort normal and breath sounds normal.  Musculoskeletal: Normal range of motion. He exhibits no edema.  Neurological: He is alert and oriented to  person, place, and time.  Skin: Skin is warm and dry.  Psychiatric: He has a normal mood and affect.        Future Appointments  Date Time Provider [place]  12/07/2017  2:00 PM [name], [place] [facility] None  12/17/2017 11:20 AM Sueanne Margarita, MD [facility]  01/13/2018 11:30 AM Camnitz, Ocie Doyne, MD [facility]    BP 102/72 (BP Location: Right Arm, Patient Position: Sitting, Cuff Size: Large)   Pulse 75   Resp 16   Wt (!) 318 lb (144.2 kg)   SpO2 96%   BMI 40.83 kg/m   Weight yesterday- 317 lb Last visit weight- 311 lb  [facility]. He reported feeling well but has been gaining weight over the past two weeks. He stated he doesn't think he is eating poorly however just yesterday he had fried country style steak with gravy. He reported that because he ate beets as a side, he believed he was eating healthy. I continue to advise him of how to eat better however he refuses to cook his meals at home. He ran out of eliquis however he has a prescription the needs to be picked up at the Outpatient [facility]. He said he was not able to go until next week because he did not have much money in his account and he was worried about gas as well. I will get this medication from the pharmacy tomorrow and take it to his house so he does not go without over the next week. His medications were verified and his pillbox was refilled.  Jacquiline Doe, EMT 11/26/17  ACTION: Home visit completed Next visit planned for 1 week

## 2017-11-27 ENCOUNTER — Other Ambulatory Visit (HOSPITAL_COMMUNITY): Payer: Self-pay | Admitting: Surgery

## 2017-11-27 ENCOUNTER — Other Ambulatory Visit (HOSPITAL_COMMUNITY): Payer: Self-pay

## 2017-11-27 MED ORDER — SACUBITRIL-VALSARTAN 97-103 MG PO TABS: 1.0000 | ORAL_TABLET | Freq: Two times a day (BID) | ORAL | 3 refills | Status: DC

## 2017-11-27 NOTE — Progress Notes (Signed)
I went to Darius [name] house to deliver his medications to him. He took a while to answer the door because he said he was sleeping and didn't hear me until the second or third time I knocked. When I asked for his pillbox he said it was still in his car from yesterday. He had not taken any medications out of the box last night and said it was because he had medication inside that he found and took. He retrieved his pillbox from the truck and I placed his medication in the required bins.

## 2017-11-27 NOTE — Telephone Encounter (Signed)
[facility] informed me that patient was almost out of his Entresto.  Refill prescription sent to [facility] to pick up medication and deliver to patient today.

## 2017-12-04 MED FILL — ENTRESTO 97 MG-103 MG TAB: 97-103 | 30 days supply | Qty: 60 | Fill #0

## 2017-12-07 ENCOUNTER — Encounter: Payer: Self-pay | Admitting: Family Medicine

## 2017-12-07 ENCOUNTER — Ambulatory Visit (INDEPENDENT_AMBULATORY_CARE_PROVIDER_SITE_OTHER): Payer: Medicaid Other | Admitting: Family Medicine

## 2017-12-07 VITALS — BP 102/56 | Temp 97.6°F | Resp 18 | Ht 74.0 in | Wt 314.0 lb

## 2017-12-07 DIAGNOSIS — E1100 Type 2 diabetes mellitus with hyperosmolarity without nonketotic hyperglycemic-hyperosmolar coma (NKHHC): Secondary | ICD-10-CM | POA: Diagnosis not present

## 2017-12-07 DIAGNOSIS — Z23 Encounter for immunization: Secondary | ICD-10-CM | POA: Diagnosis not present

## 2017-12-07 DIAGNOSIS — L309 Dermatitis, unspecified: Secondary | ICD-10-CM

## 2017-12-07 LAB — POCT URINALYSIS DIPSTICK
Glucose, UA: NEGATIVE
Ketones, UA: NEGATIVE
Nitrite, UA: NEGATIVE
Protein, UA: POSITIVE — AB
Spec Grav, UA: 1.025 (ref 1.010–1.025)
Urobilinogen, UA: 1 E.U./dL
pH, UA: 5.5 (ref 5.0–8.0)

## 2017-12-07 LAB — POCT GLYCOSYLATED HEMOGLOBIN (HGB A1C): Hemoglobin A1C: 6.9 % — AB (ref 4.0–5.6)

## 2017-12-07 MED ORDER — KETOCONAZOLE 2 % EX SHAM: 1.0000 "application " | MEDICATED_SHAMPOO | CUTANEOUS | 0 refills | Status: DC

## 2017-12-07 MED ORDER — NYSTATIN-TRIAMCINOLONE 100000-0.1 UNIT/GM-% EX OINT: 1.0000 "application " | TOPICAL_OINTMENT | Freq: Two times a day (BID) | CUTANEOUS | 1 refills | Status: DC

## 2017-12-07 MED ORDER — FLUCONAZOLE 150 MG PO TABS: 150.0000 mg | ORAL_TABLET | Freq: Once | ORAL | 0 refills | Status: AC

## 2017-12-07 MED FILL — NYSTATIN-TRIAMCINOLONE OINT: 100000-0.1 | 5 days supply | Qty: 15 | Fill #0

## 2017-12-07 MED FILL — KETOCONAZOLE 2% SHAMPOO: 2 | 30 days supply | Qty: 120 | Fill #0

## 2017-12-07 MED FILL — FLUCONAZOLE 150 MG TABS: 150 | 1 days supply | Qty: 1 | Fill #0

## 2017-12-07 NOTE — Progress Notes (Signed)
  Patient [facility]   Progress Note: General Provider: [name], FNP  SUBJECTIVE:   Darius [name] is a 62 y.o. male who  has a past medical history of Atrial fibrillation ([place]), Chronic diastolic heart failure ([place]), Diabetes mellitus, type 2 ([place]), cardiovascular stress test, Morbid obesity ([place]), and Sleep apnea.. Patient presents today for Hypertension; Diabetes; and Foot Problem (right foot "spots" ) Patient presents for follow-up on hypertension and diabetes.  Patient states that he has a history of a rash on bilateral lower extremities..  Patient states that he is a take several months ago, and does not believe that the tick stayed on him for more than 24 hours.  Patient states that he has been soaking his feet in warm water without relief.  States that the rash is itchy. Review of Systems  Constitutional: Negative.   HENT: Negative.   Eyes: Negative.   Respiratory: Negative.   Cardiovascular: Negative.   Gastrointestinal: Negative.   Genitourinary: Negative.   Musculoskeletal: Negative.   Skin: Positive for itching and rash.  Neurological: Negative.   Psychiatric/Behavioral: Negative.      OBJECTIVE: BP (!) 102/56 (BP Location: Left Arm, Patient Position: Sitting, Cuff Size: Large)   Temp 97.6 F (36.4 C) (Oral)   Resp 18   Ht 6\' 2"  (1.88 m)   Wt (!) 314 lb (142.4 kg)   SpO2 95%   BMI 40.32 kg/m   Physical Exam  Constitutional: He is oriented to person, place, and time. He appears well-developed and well-nourished. No distress.  HENT:  Head: Normocephalic and atraumatic.  Eyes: Pupils are equal, round, and reactive to light. Conjunctivae and EOM are normal.  Neck: Normal range of motion.  Cardiovascular: Normal rate, regular rhythm, normal heart sounds and intact distal pulses.  Pulmonary/Chest: Effort normal and breath sounds normal. No respiratory distress.  Abdominal: Soft. Bowel sounds are normal. He exhibits no  distension.  Musculoskeletal: Normal range of motion.  Neurological: He is alert and oriented to person, place, and time.  Skin: Skin is warm and dry. Rash noted. Rash is maculopapular.     Psychiatric: He has a normal mood and affect. His behavior is normal. Thought content normal.  Nursing note and vitals reviewed.        ASSESSMENT/PLAN: 1. Type 2 diabetes mellitus with hyperosmolarity without coma, without long-term current use of insulin (HCC) A1c 6.9 No medication changes warranted at the present time.  - HgB A1c - Urinalysis Dipstick  2. Dermatitis Discussed keeping legs and feet clean and dry. Change or disinfect shoes.  - fluconazole (DIFLUCAN) 150 MG tablet; Take 1 tablet (150 mg total) by mouth once for 1 dose.  Dispense: 1 tablet; Refill: 0 - ketoconazole (NIZORAL) 2 % shampoo; Apply 1 application topically 2 (two) times a week.  Dispense: 120 mL; Refill: 0 - Lyme Ab/Western Blot Reflex - Comprehensive metabolic panel          The patient was given clear instructions to go to ER or return to medical center if symptoms do not improve, worsen or new problems develop. The patient verbalized understanding and agreed with plan of care.   [name]. Nathaneil Canary, FNP-BC [facility] [address] [place], [place] [address] [phone]     This note has been created with Dragon speech recognition software and smart phrase technology. Any transcriptional errors are unintentional.

## 2017-12-07 NOTE — Patient Instructions (Signed)
Skin Yeast Infection Skin yeast infection is a condition in which there is an overgrowth of yeast (candida) that normally lives on the skin. This condition usually occurs in areas of the skin that are constantly warm and moist, such as the armpits or the groin. What are the causes? This condition is caused by a change in the normal balance of the yeast and bacteria that live on the skin. What increases the risk? This condition is more likely to develop in:  People who are obese.  Pregnant women.  Women who take birth control pills.  People who have diabetes.  People who take antibiotic medicines.  People who take steroid medicines.  People who are malnourished.  People who have a weak defense (immune) system.  People who are 65 years of age or older.  What are the signs or symptoms? Symptoms of this condition include:  A red, swollen area of the skin.  Bumps on the skin.  Itchiness.  How is this diagnosed? This condition is diagnosed with a medical history and physical exam. Your health care provider may check for yeast by taking light scrapings of the skin to be viewed under a microscope. How is this treated? This condition is treated with medicine. Medicines may be prescribed or be available over-the-counter. The medicines may be:  Taken by mouth (orally).  Applied as a cream.  Follow these instructions at home:  Take or apply over-the-counter and prescription medicines only as told by your health care provider.  Eat more yogurt. This may help to keep your yeast infection from returning.  Maintain a healthy weight. If you need help losing weight, talk with your health care provider.  Keep your skin clean and dry.  If you have diabetes, keep your blood sugar under control. Contact a health care provider if:  Your symptoms go away and then return.  Your symptoms do not get better with treatment.  Your symptoms get worse.  Your rash spreads.  You have a  fever or chills.  You have new symptoms.  You have new warmth or redness of your skin. This information is not intended to replace advice given to you by your health care provider. Make sure you discuss any questions you have with your health care provider. Document Released: 10/15/2010 Document Revised: 09/23/2015 Document Reviewed: 07/31/2014 Elsevier Interactive Patient Education  2018 Elsevier Inc.  

## 2017-12-08 ENCOUNTER — Telehealth (HOSPITAL_COMMUNITY): Payer: Self-pay

## 2017-12-08 NOTE — Telephone Encounter (Signed)
I called Darius [name] to schedule an appointment for later this week. He stated he would be free all day on Thursday so we agreed to meet at 11:00.

## 2017-12-09 LAB — COMPREHENSIVE METABOLIC PANEL
ALT: 18 IU/L (ref 0–44)
AST: 16 IU/L (ref 0–40)
Albumin/Globulin Ratio: 1.5 (ref 1.2–2.2)
Albumin: 3.9 g/dL (ref 3.6–4.8)
Alkaline Phosphatase: 69 IU/L (ref 39–117)
BUN/Creatinine Ratio: 15 (ref 10–24)
BUN: 20 mg/dL (ref 8–27)
Bilirubin Total: 0.6 mg/dL (ref 0.0–1.2)
CO2: 22 mmol/L (ref 20–29)
Calcium: 9.2 mg/dL (ref 8.6–10.2)
Chloride: 100 mmol/L (ref 96–106)
Creatinine, Ser: 1.33 mg/dL — ABNORMAL HIGH (ref 0.76–1.27)
GFR calc Af Amer: 65 mL/min/{1.73_m2} (ref >59)
GFR calc non Af Amer: 56 mL/min/{1.73_m2} — ABNORMAL LOW (ref >59)
Globulin, Total: 2.6 g/dL (ref 1.5–4.5)
Glucose: 138 mg/dL — ABNORMAL HIGH (ref 65–99)
Potassium: 4.2 mmol/L (ref 3.5–5.2)
Sodium: 138 mmol/L (ref 134–144)
Total Protein: 6.5 g/dL (ref 6.0–8.5)

## 2017-12-09 LAB — LYME AB/WESTERN BLOT REFLEX
LYME DISEASE AB, QUANT, IGM: <0.8 index (ref 0.00–0.79)
Lyme IgG/IgM Ab: <0.91 {ISR} (ref 0.00–0.90)

## 2017-12-10 ENCOUNTER — Other Ambulatory Visit (HOSPITAL_COMMUNITY): Payer: Self-pay

## 2017-12-10 NOTE — Progress Notes (Signed)
Paramedicine Encounter    Patient ID: Darius [name], male    DOB: 09-Dec-1955, 62 y.o.   MRN: [id number]   Patient Care Team: [name], [place] as PCP - General (Family Medicine) Constance Haw, MD as PCP - Electrophysiology (Cardiology) [name], MD as Consulting Physician (Cardiology)  Patient Active Problem List   Diagnosis Date Noted  . Urine leukocytes 05/24/2017  . Atrial fibrillation with RVR ([place]) 04/17/2016  . Elevated troponin 04/17/2016  . CHF (congestive heart failure) ([place]) 04/17/2016  . Septic olecranon bursitis of right elbow 09/19/2015  . Lactic acid acidosis   . Dilated cardiomyopathy ([place]) 04/19/2014  . Type 2 diabetes mellitus ([place]) 04/09/2014  . Non compliance with medical treatment and diet 04/09/2014  . Chronic anticoagulation-Eliquis started 04/06/14 04/09/2014  . Abnormal chest CT-LUL nodule- needs f/u May 2016 04/09/2014  . Acute on chronic combined systolic and diastolic heart failure ([place]) 04/05/2014  . OSA - C-pap 03/22/2013  . Persistent atrial fibrillation ([place]) 03/27/2011  . Obesity, Class III, BMI 40-49.9 (morbid obesity) ([place]) 03/27/2011  . URI, acute 03/27/2011    Current Outpatient Medications:  .  amiodarone (PACERONE) 200 MG tablet, TAKE 1 TABLET BY MOUTH ONCE DAILY, Disp: 34 tablet, Rfl: 2 .  apixaban (ELIQUIS) 5 MG TABS tablet, Take 1 tablet (5 mg total) by mouth 2 (two) times daily., Disp: 60 tablet, Rfl: 5 .  atorvastatin (LIPITOR) 80 MG tablet, TAKE 1 TABLET (80 MG TOTAL) BY MOUTH AT BEDTIME., Disp: 30 tablet, Rfl: 11 .  furosemide (LASIX) 40 MG tablet, TAKE 1 TABLET BY MOUTH ONCE DAILY, Disp: 100 tablet, Rfl: 2 .  ketoconazole (NIZORAL) 2 % shampoo, Apply 1 application topically 2 (two) times a week., Disp: 120 mL, Rfl: 0 .  metFORMIN (GLUCOPHAGE) 1000 MG tablet, TAKE 1 TABLET BY MOUTH TWICE A DAY WITH FOOD, Disp: 60 tablet, Rfl: 3 .  metoprolol succinate (TOPROL-XL) 50 MG 24 hr tablet, Take 1.5 tabs Twice daily Take with  or immediately following a meal., Disp: 90 tablet, Rfl: 6 .  nystatin-triamcinolone ointment (MYCOLOG), Apply 1 application topically 2 (two) times daily., Disp: 60 g, Rfl: 1 .  sacubitril-valsartan (ENTRESTO) 97-103 MG, Take 1 tablet by mouth 2 (two) times daily., Disp: 180 tablet, Rfl: 3 .  spironolactone (ALDACTONE) 25 MG tablet, TAKE 1 TABLET BY MOUTH ONCE DAILY, Disp: 34 tablet, Rfl: 2 No Known Allergies    Social History   Socioeconomic History  . Marital status: Single    Spouse name: Not on file  . Number of children: Not on file  . Years of education: Not on file  . Highest education level: Not on file  Occupational History  . Occupation: unemployeed  Social Needs  . Financial resource strain: Not on file  . Food insecurity:    Worry: Not on file    Inability: Not on file  . Transportation needs:    Medical: Not on file    Non-medical: Not on file  Tobacco Use  . Smoking status: Never Smoker  . Smokeless tobacco: Never Used  Substance and Sexual Activity  . Alcohol use: Yes    Comment: some  . Drug use: No  . Sexual activity: Never  Lifestyle  . Physical activity:    Days per week: Not on file    Minutes per session: Not on file  . Stress: Not on file  Relationships  . Social connections:    Talks on phone: Not on file    Gets together:  Not on file    Attends religious service: Not on file    Active member of club or organization: Not on file    Attends meetings of clubs or organizations: Not on file    Relationship status: Not on file  . Intimate partner violence:    Fear of current or ex partner: Not on file    Emotionally abused: Not on file    Physically abused: Not on file    Forced sexual activity: Not on file  Other Topics Concern  . Not on file  Social History Narrative   Admits that he eats poorly and eats a lot at work.      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    Physical Exam  Constitutional: He is oriented to  person, place, and time.  Cardiovascular: Normal rate and regular rhythm.  Pulmonary/Chest: Effort normal and breath sounds normal.  Abdominal: Soft.  Musculoskeletal: Normal range of motion. He exhibits no edema.  Neurological: He is alert and oriented to person, place, and time.  Skin: Skin is warm and dry.  Psychiatric: He has a normal mood and affect.        Future Appointments  Date Time Provider [place]  12/17/2017 11:20 AM Sueanne Margarita, MD [facility]  01/13/2018 11:30 AM Constance Haw, MD [facility]  03/10/2018  2:00 PM [name], FNP [facility] None    BP 108/72 (BP Location: Left Arm, Patient Position: Sitting, Cuff Size: Large)   Pulse 74   Resp 18   Wt (!) 318 lb 6.4 oz (144.4 kg)   SpO2 96%   BMI 40.88 kg/m   Weight yesterday- Did not weigh Last visit weight- 318 lb  Mr Clarin was [facility] today and reported feeling well. He denied SOB, headache, dizziness or orthopnea. He stated he has been complaint with his medications however a bottle was found with over 12 days of medications dumped in it. He stated he has been taking pills out of the bottle and then dumping the pillbox in the bottle as he goes because he did not want to let the medicine in the bottle expire. He said the bottle of pills came from a pill keeper which I filled up several weeks ago and he has just been swapping them out for the past 4 weeks. I emptied the bottle of assorted pills and placed them into their respective bottles and told him to only take the pills from the pillbox from now on so we could be sure he was taking everything correctly. He was agreeable. His medications were verified and his pillbox was refilled.   Jacquiline Doe, EMT 12/10/17  ACTION: Home visit completed Next visit planned for 1 week

## 2017-12-10 NOTE — Progress Notes (Signed)
Created in error

## 2017-12-17 ENCOUNTER — Telehealth (HOSPITAL_COMMUNITY): Payer: Self-pay | Admitting: Licensed Clinical Social Worker

## 2017-12-17 ENCOUNTER — Ambulatory Visit: Payer: Medicaid Other | Admitting: Cardiology

## 2017-12-17 ENCOUNTER — Ambulatory Visit (INDEPENDENT_AMBULATORY_CARE_PROVIDER_SITE_OTHER): Payer: Medicaid Other | Admitting: Cardiology

## 2017-12-17 ENCOUNTER — Encounter: Payer: Self-pay | Admitting: Cardiology

## 2017-12-17 VITALS — BP 106/54 | HR 73 | Ht 74.0 in | Wt 321.4 lb

## 2017-12-17 DIAGNOSIS — I4819 Other persistent atrial fibrillation: Secondary | ICD-10-CM

## 2017-12-17 DIAGNOSIS — G4733 Obstructive sleep apnea (adult) (pediatric): Secondary | ICD-10-CM

## 2017-12-17 NOTE — Telephone Encounter (Signed)
CSW called patient to check in regarding his Medicaid- during last visit CSW had assisted pt in setting up managed Medicaid but they were unable to add [facility] as his PCP at that time- was instructed to call back after he got his confirmation letter regarding insurance to add them  Pt has not called back to add [facility]- CSW reminded him that I had written the instructions on how to complete this step on his paperwork- pt will attempt- CSW will follow up to ensure pt was able to complete this step  CSW will continue to follow in clinic and assist as needed  Jorge Ny, [place] Worker [facility] [phone]

## 2017-12-17 NOTE — Patient Instructions (Signed)
Medication Instructions:  Your physician recommends that you continue on your current medications as directed. Please refer to the Current Medication list given to you today.  If you need a refill on your cardiac medications before your next appointment, please call your pharmacy.   Lab work: None Ordered  If you have labs (blood work) drawn today and your tests are completely normal, you will receive your results only by: Marland Kitchen MyChart Message (if you have MyChart) OR . A paper copy in the mail If you have any lab test that is abnormal or we need to change your treatment, we will call you to review the results.  Testing/Procedures: Your physician has recommended that you have a sleep study. This test records several body functions during sleep, including: brain activity, eye movement, oxygen and carbon dioxide blood levels, heart rate and rhythm, breathing rate and rhythm, the flow of air through your mouth and nose, snoring, body muscle movements, and chest and belly movement.    Follow-Up: . AS NEEDED  Any Other Special Instructions Will Be Listed Below (If Applicable).

## 2017-12-17 NOTE — Progress Notes (Signed)
Cardiology Office Note:    Date:  12/17/2017   ID:  Darius [name], DOB April 26, 1955, MRN [id number]  PCP:  [name], [place]  Cardiologist:  No primary care provider on file.    Referring MD: Scot Jun, FNP   Chief Complaint  Patient presents with  . Sleep Apnea    History of Present Illness:    Darius [name] is a 62 y.o. male with a hx of obstructive sleep apnea noncompliant with CPAP.  Apparently he has obstructive sleep apnea was severe by a PSG back in 2015. He saw Dr. Curt Bears recently and was interested in A. fib ablation by Dr. Curt Bears informed him that he needed to be compliant with CPAP to have an effective treatment for A. fib.  He is now referred here for further evaluation.  In review of his sleep study in 2015, he had severe obstructive sleep apnea with an AHI of 107.6/h.  He was placed on CPAP at that time and was followed briefly by Dr. glance until he retired.  He then did not follow-up with any other sleep doctor.  About 2 years ago he completely stopped using his device.  He said he went to visit his sister for a couple of days and stopped using it and then just decided not to use it anymore.  He says he does not feel tired during the day and does not know if he snores or not.  He is morbidly obese and is not compliant with diet and exercise.  He says he sits on the couch a lot and will eat when he is bored.  He does not get any aerobic activity.   Past Medical History:  Diagnosis Date  . Atrial fibrillation ([place])    unable to tolerate versed and fentanyl sedation and required gen anesthesia for TEE;  s/p TEE-DCCV (failed);  Amiodarone started  . Chronic diastolic heart failure ([place])    a. Echo 03/27/11: EF 50-55%, inferior hypokinesis, moderate LAE, mild RVE, normal pulmonary pressures. ;   b.  TEE (04/03/11): EF 40-45%, mild MR moderate LAE, no LAA clot, mild RAE;  c.  Echocardiogram (02/2013): EF 60-65%, Gr 2 DD, mildly dilated Ao root (Ao root dimension 39 mm),  MAC, mild LAE, normal RVF  . Diabetes mellitus, type 2 ([place])   . Hx of cardiovascular stress test    Lexiscan Myoview (02/2013): No ischemia or scar, EF 51%, low risk  . Morbid obesity ([place])   . Sleep apnea    a. sleep test 01/2013:  very severy OSA    Past Surgical History:  Procedure Laterality Date  . CARDIOVERSION  04/01/2011   Procedure: CARDIOVERSION;  Surgeon: Lelon Perla, MD;  Location: [facility];  Service: Cardiovascular;  Laterality: N/A;  . CARDIOVERSION  04/03/2011   Procedure: CARDIOVERSION;  Surgeon: Lelon Perla, MD;  Location: [place] [place];  Service: Cardiovascular;  Laterality: N/A;  . CARDIOVERSION  04/03/2011   Procedure: CARDIOVERSION;  Surgeon: Lelon Perla, MD;  Location: [facility];  Service: Cardiovascular;  Laterality: N/A;  . CARDIOVERSION N/A 06/15/2014   Procedure: CARDIOVERSION;  Surgeon: [name], MD;  Location: [facility] ENDOSCOPY;  Service: Cardiovascular;  Laterality: N/A;  . CARDIOVERSION N/A 09/08/2016   Procedure: CARDIOVERSION;  Surgeon: [name], MD;  Location: [facility] ENDOSCOPY;  Service: Cardiovascular;  Laterality: N/A;  . I&D EXTREMITY Right 09/19/2015   Procedure: IRRIGATION AND DEBRIDEMENT EXTREMITY;  Surgeon: Mcarthur Rossetti, MD;  Location: [facility];  Service: Orthopedics;  Laterality: Right;  . RIGHT/LEFT HEART CATH AND CORONARY ANGIOGRAPHY N/A 04/21/2016   Procedure: Right/Left Heart Cath and Coronary Angiography;  Surgeon: [name], MD;  Location: [facility] CV LAB;  Service: Cardiovascular;  Laterality: N/A;  . TEE WITHOUT CARDIOVERSION  04/01/2011   Procedure: TRANSESOPHAGEAL ECHOCARDIOGRAM (TEE);  Surgeon: Lelon Perla, MD;  Location: [facility] ENDOSCOPY;  Service: Cardiovascular;  Laterality: N/A;  . TEE WITHOUT CARDIOVERSION  04/03/2011   Procedure: TRANSESOPHAGEAL ECHOCARDIOGRAM (TEE);  Surgeon: Lelon Perla, MD;  Location: [facility] ENDOSCOPY;  Service: Cardiovascular;  Laterality: N/A;  . TEE WITHOUT CARDIOVERSION  04/03/2011    Procedure: TRANSESOPHAGEAL ECHOCARDIOGRAM (TEE);  Surgeon: Lelon Perla, MD;  Location: [place];  Service: Cardiovascular;  Laterality: N/A;  . TEE WITHOUT CARDIOVERSION N/A 04/24/2016   Procedure: TRANSESOPHAGEAL ECHOCARDIOGRAM (TEE);  Surgeon: [name], MD;  Location: [place];  Service: Cardiovascular;  Laterality: N/A;  . TEE WITHOUT CARDIOVERSION N/A 05/26/2016   Procedure: TRANSESOPHAGEAL ECHOCARDIOGRAM (TEE);  Surgeon: [name], MD;  Location: [place];  Service: Cardiovascular;  Laterality: N/A;  . TEE WITHOUT CARDIOVERSION N/A 09/08/2016   Procedure: TRANSESOPHAGEAL ECHOCARDIOGRAM (TEE);  Surgeon: [name], MD;  Location: [facility] ENDOSCOPY;  Service: Cardiovascular;  Laterality: N/A;    Current Medications: Current Meds  Medication Sig  . amiodarone (PACERONE) 200 MG tablet TAKE 1 TABLET BY MOUTH ONCE DAILY  . apixaban (ELIQUIS) 5 MG TABS tablet Take 1 tablet (5 mg total) by mouth 2 (two) times daily.  Marland Kitchen atorvastatin (LIPITOR) 80 MG tablet TAKE 1 TABLET (80 MG TOTAL) BY MOUTH AT BEDTIME.  . furosemide (LASIX) 40 MG tablet TAKE 1 TABLET BY MOUTH ONCE DAILY  . ketoconazole (NIZORAL) 2 % shampoo Apply 1 application topically 2 (two) times a week.  . metFORMIN (GLUCOPHAGE) 1000 MG tablet TAKE 1 TABLET BY MOUTH TWICE A DAY WITH FOOD  . metoprolol succinate (TOPROL-XL) 50 MG 24 hr tablet Take 1.5 tabs Twice daily Take with or immediately following a meal.  . nystatin-triamcinolone ointment (MYCOLOG) Apply 1 application topically 2 (two) times daily.  . sacubitril-valsartan (ENTRESTO) 97-103 MG Take 1 tablet by mouth 2 (two) times daily.  Marland Kitchen spironolactone (ALDACTONE) 25 MG tablet TAKE 1 TABLET BY MOUTH ONCE DAILY     Allergies:   Patient has no known allergies.   Social History   Socioeconomic History  . Marital status: Single    Spouse name: Not on file  . Number of children: Not on file  . Years of education: Not on file  . Highest education level: Not  on file  Occupational History  . Occupation: unemployeed  Social Needs  . Financial resource strain: Not on file  . Food insecurity:    Worry: Not on file    Inability: Not on file  . Transportation needs:    Medical: Not on file    Non-medical: Not on file  Tobacco Use  . Smoking status: Never Smoker  . Smokeless tobacco: Never Used  Substance and Sexual Activity  . Alcohol use: Yes    Comment: some  . Drug use: No  . Sexual activity: Never  Lifestyle  . Physical activity:    Days per week: Not on file    Minutes per session: Not on file  . Stress: Not on file  Relationships  . Social connections:    Talks on phone: Not on file    Gets together: Not on file    Attends religious service: Not on file  Active member of club or organization: Not on file    Attends meetings of clubs or organizations: Not on file    Relationship status: Not on file  Other Topics Concern  . Not on file  Social History Narrative   Admits that he eats poorly and eats a lot at work.      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     Family History: The patient's family history includes Diabetes in his father; Hypertension in his father, paternal grandfather, and sister; Seizures in his mother. There is no history of Heart attack or Stroke.  ROS:   Please see the history of present illness.    ROS  All other systems reviewed and negative.   EKGs/Labs/Other Studies Reviewed:    The following studies were reviewed today: Sleep study from 2015  EKG:  EKG is not ordered today.    Recent Labs: 02/13/2017: BNP 129.4 10/02/2017: TSH 0.083 12/07/2017: ALT 18; BUN 20; Creatinine, Ser 1.33; Potassium 4.2; Sodium 138   Recent Lipid Panel    Component Value Date/Time   CHOL 120 12/12/2016 0928   TRIG 77 12/12/2016 0928   HDL 62 12/12/2016 0928   CHOLHDL 1.9 12/12/2016 0928   VLDL 17 04/18/2016 1459   LDLCALC 42 12/12/2016 0928    Physical Exam:    VS:  BP (!)  106/54   Pulse 73   Ht 6' 2"  (1.88 m)   Wt (!) 321 lb 6.4 oz (145.8 kg)   SpO2 96%   BMI 41.27 kg/m     Wt Readings from Last 3 Encounters:  12/17/17 (!) 321 lb 6.4 oz (145.8 kg)  12/10/17 (!) 318 lb 6.4 oz (144.4 kg)  12/07/17 (!) 314 lb (142.4 kg)     GEN:  Well nourished, well developed in no acute distress HEENT: Normal NECK: No JVD; No carotid bruits LYMPHATICS: No lymphadenopathy CARDIAC: RRR, no murmurs, rubs, gallops RESPIRATORY:  Clear to auscultation without rales, wheezing or rhonchi  ABDOMEN: Soft, non-tender, non-distended MUSCULOSKELETAL:  No edema; No deformity  SKIN: Warm and dry NEUROLOGIC:  Alert and oriented x 3 PSYCHIATRIC:  Normal affect   ASSESSMENT:    1. OSA - C-pap   2. Persistent atrial fibrillation (HCC)   3. Obesity, Class III, BMI 40-49.9 (morbid obesity) ([place])    PLAN:    In order of problems listed above:  1.  OSA -he had severe obstructive sleep apnea by sleep study back in 2015 showing AHI of 107.6/h.  These were all obstructive events and none of them were central.  He has not been compliant with his CPAP device.  He was followed by Dr. Danton Sewer with pulmonary for a brief period of time but then has not used his device in 2 years.  He knows he needs to get back on CPAP.  Since he has not used his device in over 2 years and review of prior office notes from Dr. Gwenette Greet was having a hard time tolerating the high pressures he needed on CPAP.  He may need a BiPAP device.  We will set him up for a split-night sleep study to start out with.  2.  Persistent atrial fibrillation - he is maintaining normal sinus rhythm on exam today.  I did stress the importance of him being compliant with his sleep apnea in order to reduce the incidence of atrial fibrillation.  Any treatment he uses for A. fib will be suboptimal if his sleep apnea is  not controlled especially given the severity of it.  3.  Morbid obesity - I have encouraged him to get into a routine  exercise program and cut back on carbs and portions.    Medication Adjustments/Labs and Tests Ordered: Current medicines are reviewed at length with the patient today.  Concerns regarding medicines are outlined above.  Orders Placed This Encounter  Procedures  . Split night study   No orders of the defined types were placed in this encounter.   Signed, Fransico Him, MD  12/17/2017 11:43 AM    [place]

## 2017-12-21 ENCOUNTER — Telehealth: Payer: Self-pay | Admitting: *Deleted

## 2017-12-21 NOTE — Telephone Encounter (Signed)
-----   Message from Cleon Gustin, RN sent at 12/21/2017  2:07 PM EST ----- Regarding: Sleep Study Per Dr. Radford Pax, patient needs Sleep Study for OSA. Please pre-cert and schedule. Thanks

## 2017-12-23 ENCOUNTER — Telehealth: Payer: Self-pay | Admitting: *Deleted

## 2017-12-23 ENCOUNTER — Telehealth (HOSPITAL_COMMUNITY): Payer: Self-pay

## 2017-12-23 NOTE — Telephone Encounter (Signed)
Staff message sent to Gae Bon ok to schedule sleep study. Patient's insurance is Medicaid and does not require a PA.

## 2017-12-23 NOTE — Telephone Encounter (Signed)
-----   Message from Cleon Gustin, RN sent at 12/21/2017  2:07 PM EST ----- Regarding: Sleep Study Per Dr. Radford Pax, patient needs Sleep Study for OSA. Please pre-cert and schedule. Thanks

## 2017-12-24 ENCOUNTER — Other Ambulatory Visit (HOSPITAL_COMMUNITY): Payer: Self-pay | Admitting: Cardiology

## 2017-12-24 ENCOUNTER — Telehealth (HOSPITAL_COMMUNITY): Payer: Self-pay | Admitting: *Deleted

## 2017-12-24 ENCOUNTER — Other Ambulatory Visit (HOSPITAL_COMMUNITY): Payer: Self-pay

## 2017-12-24 ENCOUNTER — Other Ambulatory Visit (HOSPITAL_COMMUNITY): Payer: Self-pay | Admitting: Student

## 2017-12-24 MED FILL — metFORMIN HCL 1000 MG TABS: 1000 | 30 days supply | Qty: 60 | Fill #3

## 2017-12-24 MED FILL — ELIQUIS 5 MG TABLET: 5 | 30 days supply | Qty: 60 | Fill #1

## 2017-12-24 MED FILL — SPIRONOLACTONE 25 MG TABLET: 25 | 30 days supply | Qty: 30 | Fill #0

## 2017-12-24 MED FILL — METOPROLOL SUCCINATE ER 50: 50 | 30 days supply | Qty: 90 | Fill #0

## 2017-12-24 MED FILL — ATORVASTATIN CALCIUM 80 MG: 80 | 30 days supply | Qty: 30 | Fill #8

## 2017-12-24 MED FILL — FUROSEMIDE 40 MG TAB: 40 | 34 days supply | Qty: 34 | Fill #2

## 2017-12-24 NOTE — Telephone Encounter (Signed)
I called Mr Darius [name] to schedule an appointment. He stated he would be able to meet tomorrow so we agreed to meet at 10:00 at [employer].

## 2017-12-24 NOTE — Progress Notes (Signed)
Paramedicine Encounter    Patient ID: Darius [name], male    DOB: 08-Apr-1955, 62 y.o.   MRN: [id number]   Patient Care Team: Darius [name], [place] as PCP - General (Family Medicine) Darius Haw, MD as PCP - Electrophysiology (Cardiology) Darius Dresser, MD as Consulting Physician (Cardiology)  Patient Active Problem List   Diagnosis Date Noted  . Urine leukocytes 05/24/2017  . Atrial fibrillation with RVR ([place]) 04/17/2016  . Elevated troponin 04/17/2016  . CHF (congestive heart failure) ([place]) 04/17/2016  . Septic olecranon bursitis of right elbow 09/19/2015  . Lactic acid acidosis   . Dilated cardiomyopathy ([place]) 04/19/2014  . Type 2 diabetes mellitus ([place]) 04/09/2014  . Non compliance with medical treatment and diet 04/09/2014  . Chronic anticoagulation-Eliquis started 04/06/14 04/09/2014  . Abnormal chest CT-LUL nodule- needs f/u May 2016 04/09/2014  . Acute on chronic combined systolic and diastolic heart failure ([place]) 04/05/2014  . OSA - C-pap 03/22/2013  . Persistent atrial fibrillation ([place]) 03/27/2011  . Obesity, Class III, BMI 40-49.9 (morbid obesity) ([place]) 03/27/2011  . URI, acute 03/27/2011    Current Outpatient Medications:  .  amiodarone (PACERONE) 200 MG tablet, TAKE 1 TABLET BY MOUTH ONCE DAILY, Disp: 34 tablet, Rfl: 2 .  apixaban (ELIQUIS) 5 MG TABS tablet, Take 1 tablet (5 mg total) by mouth 2 (two) times daily., Disp: 60 tablet, Rfl: 5 .  atorvastatin (LIPITOR) 80 MG tablet, TAKE 1 TABLET (80 MG TOTAL) BY MOUTH AT BEDTIME., Disp: 30 tablet, Rfl: 11 .  furosemide (LASIX) 40 MG tablet, TAKE 1 TABLET BY MOUTH ONCE DAILY, Disp: 100 tablet, Rfl: 2 .  metFORMIN (GLUCOPHAGE) 1000 MG tablet, TAKE 1 TABLET BY MOUTH TWICE A DAY WITH FOOD, Disp: 60 tablet, Rfl: 3 .  sacubitril-valsartan (ENTRESTO) 97-103 MG, Take 1 tablet by mouth 2 (two) times daily., Disp: 180 tablet, Rfl: 3 .  ketoconazole (NIZORAL) 2 % shampoo, Apply 1 application topically 2 (two) times a week.  (Patient not taking: Reported on 12/24/2017), Disp: 120 mL, Rfl: 0 .  metoprolol succinate (TOPROL-XL) 50 MG 24 hr tablet, TAKE 1 & 1/2 TABLET BY MOUTH TWICE DAILY WITH OR IMMEDIATELY FOLLOWING A MEAL, Disp: 90 tablet, Rfl: 5 .  nystatin-triamcinolone ointment (MYCOLOG), Apply 1 application topically 2 (two) times daily. (Patient not taking: Reported on 12/24/2017), Disp: 60 g, Rfl: 1 .  spironolactone (ALDACTONE) 25 MG tablet, TAKE 1 TABLET BY MOUTH ONCE DAILY, Disp: 30 tablet, Rfl: 5 No Known Allergies    Social History   Socioeconomic History  . Marital status: Single    Spouse name: Not on file  . Number of children: Not on file  . Years of education: Not on file  . Highest education level: Not on file  Occupational History  . Occupation: unemployeed  Social Needs  . Financial resource strain: Not on file  . Food insecurity:    Worry: Not on file    Inability: Not on file  . Transportation needs:    Medical: Not on file    Non-medical: Not on file  Tobacco Use  . Smoking status: Never Smoker  . Smokeless tobacco: Never Used  Substance and Sexual Activity  . Alcohol use: Yes    Comment: some  . Drug use: No  . Sexual activity: Never  Lifestyle  . Physical activity:    Days per week: Not on file    Minutes per session: Not on file  . Stress: Not on file  Relationships  . Social  connections:    Talks on phone: Not on file    Gets together: Not on file    Attends religious service: Not on file    Active member of club or organization: Not on file    Attends meetings of clubs or organizations: Not on file    Relationship status: Not on file  . Intimate partner violence:    Fear of current or ex partner: Not on file    Emotionally abused: Not on file    Physically abused: Not on file    Forced sexual activity: Not on file  Other Topics Concern  . Not on file  Social History Narrative   Admits that he eats poorly and eats a lot at work.      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    Physical Exam  Constitutional: He is oriented to person, place, and time.  Cardiovascular: An irregularly irregular rhythm present. Tachycardia present.  Pulmonary/Chest: Effort normal and breath sounds normal.  Musculoskeletal: Normal range of motion. He exhibits edema.  Neurological: He is alert and oriented to person, place, and time.  Skin: Skin is warm and dry.  Psychiatric: He has a normal mood and affect.        Future Appointments  Date Time Provider [place]  12/25/2017  9:30 AM [facility] PA/NP [facility] None  01/13/2018 11:30 AM Darius Haw, MD [facility]  03/10/2018  2:00 PM Darius Boast, FNP [facility] None    BP 104/64 (BP Location: Left Arm, Patient Position: Sitting, Cuff Size: Large)   Pulse 100   Resp 18   Wt (!) 317 lb (143.8 kg)   SpO2 95%   BMI 40.70 kg/m   Weight yesterday- 316 lb Last visit weight- 318 lb  Mr Darius [name] was see at home today and reported feeling generally well. He denied chest pain, SOB, headache, dizziness or orthopnea. He reported being compliant with his medications over the past two weeks and his weights have been stable. His medications were verified and his pillbox was refilled however he did not have enough Eliquis to fill it for the next two weeks so I will have to follow up next week to finish filling his pillbox. All necessary medications were ordered. Upon obtaining vital signs I noted him to be in an irregular rhythm. I placed him on the LP15 and noted him to be in atrial fibrillation. I contacted the clinic who scheduled him for an APP appointment tomorrow morning. I will follow up with the clinic to see about any changes that are made.    Darius [name], EMT 12/24/17  ACTION: Home visit completed Next visit planned for 1 week

## 2017-12-24 NOTE — Telephone Encounter (Signed)
Darius [name] called the triage line and spoke with Levada Dy he reported patient is currently in afib with a rate of 100. Per Dr.McLean patient needs to be seen in APP clinic today or tomorrow and he may need DCCV. Darius [name] aware and appt scheduled.

## 2017-12-25 ENCOUNTER — Telehealth (HOSPITAL_COMMUNITY): Payer: Self-pay | Admitting: Licensed Clinical Social Worker

## 2017-12-25 ENCOUNTER — Ambulatory Visit (HOSPITAL_COMMUNITY)
Admission: RE | Admit: 2017-12-25 | Discharge: 2017-12-25 | Disposition: A | Discharge status: Home / Self Care | Payer: Medicaid Other | Source: Ambulatory Visit | Attending: Cardiology | Admitting: Cardiology

## 2017-12-25 ENCOUNTER — Encounter (HOSPITAL_COMMUNITY): Payer: Self-pay

## 2017-12-25 ENCOUNTER — Other Ambulatory Visit: Payer: Self-pay

## 2017-12-25 ENCOUNTER — Other Ambulatory Visit (HOSPITAL_COMMUNITY): Payer: Self-pay

## 2017-12-25 VITALS — BP 98/64 | HR 91 | Wt 320.6 lb

## 2017-12-25 DIAGNOSIS — I509 Heart failure, unspecified: Secondary | ICD-10-CM

## 2017-12-25 DIAGNOSIS — Z6841 Body Mass Index (BMI) 40.0 and over, adult: Secondary | ICD-10-CM | POA: Insufficient documentation

## 2017-12-25 DIAGNOSIS — Z7901 Long term (current) use of anticoagulants: Secondary | ICD-10-CM | POA: Diagnosis not present

## 2017-12-25 DIAGNOSIS — I4891 Unspecified atrial fibrillation: Secondary | ICD-10-CM

## 2017-12-25 DIAGNOSIS — E119 Type 2 diabetes mellitus without complications: Secondary | ICD-10-CM | POA: Diagnosis not present

## 2017-12-25 DIAGNOSIS — I428 Other cardiomyopathies: Secondary | ICD-10-CM | POA: Diagnosis not present

## 2017-12-25 DIAGNOSIS — Z8249 Family history of ischemic heart disease and other diseases of the circulatory system: Secondary | ICD-10-CM | POA: Insufficient documentation

## 2017-12-25 DIAGNOSIS — Z79899 Other long term (current) drug therapy: Secondary | ICD-10-CM | POA: Diagnosis not present

## 2017-12-25 DIAGNOSIS — E66813 Obesity, class 3: Secondary | ICD-10-CM

## 2017-12-25 DIAGNOSIS — Z7984 Long term (current) use of oral hypoglycemic drugs: Secondary | ICD-10-CM | POA: Insufficient documentation

## 2017-12-25 DIAGNOSIS — E785 Hyperlipidemia, unspecified: Secondary | ICD-10-CM | POA: Insufficient documentation

## 2017-12-25 DIAGNOSIS — I5022 Chronic systolic (congestive) heart failure: Secondary | ICD-10-CM | POA: Insufficient documentation

## 2017-12-25 DIAGNOSIS — G4733 Obstructive sleep apnea (adult) (pediatric): Secondary | ICD-10-CM

## 2017-12-25 DIAGNOSIS — Z9119 Patient's noncompliance with other medical treatment and regimen: Secondary | ICD-10-CM | POA: Diagnosis not present

## 2017-12-25 DIAGNOSIS — I2510 Atherosclerotic heart disease of native coronary artery without angina pectoris: Secondary | ICD-10-CM | POA: Insufficient documentation

## 2017-12-25 MED ORDER — AMIODARONE HCL 200 MG PO TABS: 200.0000 mg | ORAL_TABLET | Freq: Two times a day (BID) | ORAL | 2 refills | Status: DC

## 2017-12-25 MED FILL — AMIODARONE HCL 200 MG TAB: 200 | 30 days supply | Qty: 60 | Fill #0

## 2017-12-25 NOTE — Progress Notes (Signed)
Advanced Heart Failure Clinic Note   PCP: Colin Benton, DO  HF Cardiologist:  Dr. [name]  HPI: Darius [name] is a 62 y.o. 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   Admitted 7/0/62-3/76/28 for acute systolic CHF and atrial fibrillation with RVR. Had a R/L heart cath on 04/21/16, no CAD, right heart pressures elevated, low output. Echo showed EF 15%. Plan was to undergo TEE/DC-CV, however he was found to have a left atrial appendage thrombus and had only started Eliquis 5 days prior. He was discharged with plans for TEE/DC-CV in about a month. He was started on Entresto 24/26 mg BID during admission. Discharged home on Lasix 40 mg daily, digoxin 0.184m, and metoprolol XL 50 mg BID. Discharge 280 pounds.   Repeat TEE done in 4/18 for possible cardioversion.  EF remained 25-30%.  He still had a small, amorphous LA appendage thrombus (smaller than prior but still present).  He admitted to having missed some Eliquis doses.    He finally had TEE-guided DCCV in 7/18 with conversion to NSR.  TEE showed EF 40-45%.   On 10/13/17 he was evaluated by Dr CCurt Bearsfor possible A fib ablation. He was not a candidate at that time due to compliance issues with CPAP.   He saw Dr TRadford Paxon 12/17/17 for OSA. He is being set up for another sleep study.   Yesterday he was seen by HF paramedicine.and he was back in A fib so he was set up HF follow up.   Today he presents for HF follow up for possible A fib. Overall feeling fine. Denies SOB/PND/Orthopnea. He is not using CPAP.  Appetite ok. No fever or chills. Weight at home has been stable. Over the last week he missed 2-3 days of his medications. He is followed by PBrent Bullaand has his pill box set up.   Cardiac Studies  RHC/LHC 04/21/2016  RA mean 14 RV 45/15 PA 50/28, mean 36 PCWP mean 22 LV 104/20 AO 101/75 Oxygen saturations: PA  59% AO 92% Cardiac Output (Fick) 4.85  Cardiac Index (Fick) 1.89 PVR 2.9 WU  - TEE: 04/2016 with LAA thrombus.  - TEE: 4/18 with EF 25-30%, diffuse hypokinesis, RV mildly dilated with moderately decreased systolic function, amorphous thrombus LA appendage (improved compared to 3/18).  -TEE 09/08/2016 EF 40-45% No thrombus. Successful DC-CV  - ECHO 04/24/2016 EF 15% Peak PA pressure 38 mm hg  -ECHO 09/11/2017: EF 55-60% RV normal.   Labs  04/24/2016: K 4.5 Creatinine 1.00, digoxin < 0.2 4/18: K 4.3, creatinine 1.02 10/18: Thyroid panel normal, K 4.6, creatinine 1.05, LFTs normal, hgb 14.4 11/18: LDL 42, HDL 62 1/19: K 4.6, creatinine 0.97, LFTs normal, BNP 129  Review of systems complete and found to be negative unless listed in HPI.   SH:  Social History   Socioeconomic History  . Marital status: Single    Spouse name: Not on file  . Number of children: Not on file  . Years of education: Not on file  . Highest education level: Not on file  Occupational History  . Occupation: unemployeed  Social Needs  . Financial resource strain: Not on file  . Food insecurity:    Worry: Not on file    Inability: Not on file  . Transportation needs:    Medical: Not on file    Non-medical: Not on file  Tobacco Use  .  Smoking status: Never Smoker  . Smokeless tobacco: Never Used  Substance and Sexual Activity  . Alcohol use: Yes    Comment: some  . Drug use: No  . Sexual activity: Never  Lifestyle  . Physical activity:    Days per week: Not on file    Minutes per session: Not on file  . Stress: Not on file  Relationships  . Social connections:    Talks on phone: Not on file    Gets together: Not on file    Attends religious service: Not on file    Active member of club or organization: Not on file    Attends meetings of clubs or organizations: Not on file    Relationship status: Not on file  . Intimate partner violence:    Fear of current or ex partner: Not on file     Emotionally abused: Not on file    Physically abused: Not on file    Forced sexual activity: Not on file  Other Topics Concern  . Not on file  Social History Narrative   Admits that he eats poorly and eats a lot at work.      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 Diabetes Father        died in his 42s  . Hypertension Father   . Seizures Mother        died in her 61s  . Hypertension Sister   . Hypertension Paternal Grandfather   . Heart attack Neg Hx   . Stroke Neg Hx     Past Medical History:  Diagnosis Date  . Atrial fibrillation ([place])    unable to tolerate versed and fentanyl sedation and required gen anesthesia for TEE;  s/p TEE-DCCV (failed);  Amiodarone started  . Chronic diastolic heart failure ([place])    a. Echo 03/27/11: EF 50-55%, inferior hypokinesis, moderate LAE, mild RVE, normal pulmonary pressures. ;   b.  TEE (04/03/11): EF 40-45%, mild MR moderate LAE, no LAA clot, mild RAE;  c.  Echocardiogram (02/2013): EF 60-65%, Gr 2 DD, mildly dilated Ao root (Ao root dimension 39 mm), MAC, mild LAE, normal RVF  . Diabetes mellitus, type 2 ([place])   . Hx of cardiovascular stress test    Lexiscan Myoview (02/2013): No ischemia or scar, EF 51%, low risk  . Morbid obesity ([place])   . Sleep apnea    a. sleep test 01/2013:  very severy OSA    Current Outpatient Medications  Medication Sig Dispense Refill  . amiodarone (PACERONE) 200 MG tablet TAKE 1 TABLET BY MOUTH ONCE DAILY 34 tablet 2  . apixaban (ELIQUIS) 5 MG TABS tablet Take 1 tablet (5 mg total) by mouth 2 (two) times daily. 60 tablet 5  . atorvastatin (LIPITOR) 80 MG tablet TAKE 1 TABLET (80 MG TOTAL) BY MOUTH AT BEDTIME. 30 tablet 11  . furosemide (LASIX) 40 MG tablet TAKE 1 TABLET BY MOUTH ONCE DAILY 100 tablet 2  . ketoconazole (NIZORAL) 2 % shampoo Apply 1 application topically 2 (two) times a week. 120 mL 0  . metFORMIN (GLUCOPHAGE) 1000  MG tablet TAKE 1 TABLET BY MOUTH TWICE A DAY WITH FOOD 60 tablet 3  . metoprolol succinate (TOPROL-XL) 50 MG 24 hr tablet TAKE 1 & 1/2 TABLET BY MOUTH TWICE DAILY WITH OR IMMEDIATELY FOLLOWING A MEAL 90 tablet 5  . nystatin-triamcinolone ointment (MYCOLOG) Apply 1  application topically 2 (two) times daily. 60 g 1  . sacubitril-valsartan (ENTRESTO) 97-103 MG Take 1 tablet by mouth 2 (two) times daily. 180 tablet 3  . spironolactone (ALDACTONE) 25 MG tablet TAKE 1 TABLET BY MOUTH ONCE DAILY 30 tablet 5   No current facility-administered medications for this encounter.     Vitals:   12/25/17 0848  BP: 98/64  Pulse: 91  SpO2: 95%  Weight: (!) 145.4 kg (320 lb 9.6 oz)   Wt Readings from Last 3 Encounters:  12/25/17 (!) 145.4 kg (320 lb 9.6 oz)  12/24/17 (!) 143.8 kg (317 lb)  12/17/17 (!) 145.8 kg (321 lb 6.4 oz)    PHYSICAL EXAM: General:  Well appearing. No resp difficulty. Walked in the clinic.  HEENT: normal Neck: supple. no JVD. Carotids 2+ bilat; no bruits. No lymphadenopathy or thryomegaly appreciated. Cor: PMI nondisplaced. Irregular  gular rate & rhythm. No rubs, gallops or murmurs. Lungs: clear Abdomen: soft, nontender, nondistended. No hepatosplenomegaly. No bruits or masses. Good bowel sounds. Extremities: no cyanosis, clubbing, rash, edema Neuro: alert & orientedx3, cranial nerves grossly intact. moves all 4 extremities w/o difficulty. Affect pleasant  EKG: A fib 100 bpm   ASSESSMENT & PLAN:  1. Chronic systolic CHF: Had [facility] 08/6806 showing minimal CAD.  Nonischemic cardiomyopathy, possibly tachycardia-mediated in the setting of rapid atrial fibrillation.   Echo 04/2016 EF 15% but improved to 40-45% on TEE in 7/18. On 8/2 2019 EF had normalized to 55-60%.    - NYHA I. Volume status stable. Continue lasix 40 mg daily  - Continue Entresto 97/103 bid.     - Continue spironolactone 25 mg daily.  - Continue Toprol XL 75 mg bid.  2. Atrial fibrillation:  DCCV in 7/18.  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   Today he is back in A fib. He has missed a 2-3 days of evening eliquis. I have personally called Zach with Paramedicine. He will follow up with him today.  - Increase amiodarone to 200 mg twice daily.   - Continue apixaban 5 mg bid.  Stressed the importance of compliance.  - He has been evaluated by EP for ablation. He has follow up early next month with Dr Curt Bears. .  - 3. HLD: Continue statin. No change.  4. OSA: Not using CPAP. Saw Dr Radford Pax and he is being set up for another sleep study. Need to address OSA prior to ablation.  5. Obesity Body mass index is 41.16 kg/m.   Plan to get EKG at house next week by Paramedicine. Hopefully he will chemically convert. I have stressed the importance of medication compliance. He will need close follow up by Paramedicine. He is difficult to manage due poor insight.   Follow up in 4 weeks.      NP-C  12/25/2017

## 2017-12-25 NOTE — Telephone Encounter (Signed)
CSW called pt to help set up PCP on new managed Medicaid card for next year.  CSW able to call and have pt give verbal permission for CSW to speak with representative.  CSW able to have Dr. [name] in the [facility] office added to pt card as his PCP.  Pt with no further needs at this time- CSW will continue to follow through community paramedicine program  Jorge Ny, [place] Worker [facility] [phone]

## 2017-12-25 NOTE — Patient Instructions (Signed)
INCREASE Amiodarone to 200 mg, one tab twice a day  Your physician recommends that you schedule a follow-up appointment in: 4-6 weeks  in the Advanced Practitioners (PA/NP) Clinic    Do the following things EVERYDAY: 1) Weigh yourself in the morning before breakfast. Write it down and keep it in a log. 2) Take your medicines as prescribed 3) Eat low salt foods-Limit salt (sodium) to 2000 mg per day.  4) Stay as active as you can everyday 5) Limit all fluids for the day to less than 2 liters

## 2017-12-25 NOTE — Progress Notes (Signed)
Darius [name] was seen at home today at the request of the HF clinic. The advised he needed to increase his amiodarone to twice daily. I made the adjustment and had a long conversation regarding honesty and reliability. He has been lying about taking his medications daily and brought another bottle of miscellaneous pills out of his house with approximately 1-2 weeks worth of medicine in it. I explained that we would be getting back to weekly visits and if he did not start follow the clinic's instructions, he was going to be discharged from paramedicine. He was understanding and agreeable.

## 2017-12-28 NOTE — Telephone Encounter (Signed)
Patient is scheduled for lab study on 02/14/2018. Patient understands his sleep study will be done at [facility] sleep lab. Patient understands he will receive a sleep packet in a week or so. Patient understands to call if he does not receive the sleep packet in a timely manner.  Left detailed message on voicemail with date and time of titration and informed patient to call back to confirm or reschedule.

## 2017-12-31 ENCOUNTER — Other Ambulatory Visit (HOSPITAL_COMMUNITY): Payer: Self-pay

## 2017-12-31 ENCOUNTER — Telehealth (HOSPITAL_COMMUNITY): Payer: Self-pay

## 2017-12-31 NOTE — Telephone Encounter (Signed)
I called Mr Coupe to schedule an appointment. He stated he would be available any time today so we agreed to meet at 13:15 at [employer].

## 2017-12-31 NOTE — Progress Notes (Signed)
Paramedicine Encounter    Patient ID: Darius [name], male    DOB: 05/30/1955, 62 y.o.   MRN: [id number]   Patient Care Team: [name], [place] as PCP - General (Family Medicine) Constance Haw, MD as PCP - Electrophysiology (Cardiology) [name], MD as Consulting Physician (Cardiology)  Patient Active Problem List   Diagnosis Date Noted  . Urine leukocytes 05/24/2017  . Atrial fibrillation with RVR ([place]) 04/17/2016  . Elevated troponin 04/17/2016  . CHF (congestive heart failure) ([place]) 04/17/2016  . Septic olecranon bursitis of right elbow 09/19/2015  . Lactic acid acidosis   . Dilated cardiomyopathy ([place]) 04/19/2014  . Type 2 diabetes mellitus ([place]) 04/09/2014  . Non compliance with medical treatment and diet 04/09/2014  . Chronic anticoagulation-Eliquis started 04/06/14 04/09/2014  . Abnormal chest CT-LUL nodule- needs f/u May 2016 04/09/2014  . Acute on chronic combined systolic and diastolic heart failure ([place]) 04/05/2014  . OSA - C-pap 03/22/2013  . Persistent atrial fibrillation ([place]) 03/27/2011  . Obesity, Class III, BMI 40-49.9 (morbid obesity) ([place]) 03/27/2011  . URI, acute 03/27/2011    Current Outpatient Medications:  .  amiodarone (PACERONE) 200 MG tablet, Take 1 tablet (200 mg total) by mouth 2 (two) times daily., Disp: 60 tablet, Rfl: 2 .  apixaban (ELIQUIS) 5 MG TABS tablet, Take 1 tablet (5 mg total) by mouth 2 (two) times daily., Disp: 60 tablet, Rfl: 5 .  atorvastatin (LIPITOR) 80 MG tablet, TAKE 1 TABLET (80 MG TOTAL) BY MOUTH AT BEDTIME., Disp: 30 tablet, Rfl: 11 .  furosemide (LASIX) 40 MG tablet, TAKE 1 TABLET BY MOUTH ONCE DAILY, Disp: 100 tablet, Rfl: 2 .  ketoconazole (NIZORAL) 2 % shampoo, Apply 1 application topically 2 (two) times a week., Disp: 120 mL, Rfl: 0 .  metFORMIN (GLUCOPHAGE) 1000 MG tablet, TAKE 1 TABLET BY MOUTH TWICE A DAY WITH FOOD, Disp: 60 tablet, Rfl: 3 .  metoprolol succinate (TOPROL-XL) 50 MG 24 hr tablet, TAKE 1 & 1/2  TABLET BY MOUTH TWICE DAILY WITH OR IMMEDIATELY FOLLOWING A MEAL, Disp: 90 tablet, Rfl: 5 .  nystatin-triamcinolone ointment (MYCOLOG), Apply 1 application topically 2 (two) times daily., Disp: 60 g, Rfl: 1 .  sacubitril-valsartan (ENTRESTO) 97-103 MG, Take 1 tablet by mouth 2 (two) times daily., Disp: 180 tablet, Rfl: 3 .  spironolactone (ALDACTONE) 25 MG tablet, TAKE 1 TABLET BY MOUTH ONCE DAILY, Disp: 30 tablet, Rfl: 5 No Known Allergies    Social History   Socioeconomic History  . Marital status: Single    Spouse name: Not on file  . Number of children: Not on file  . Years of education: Not on file  . Highest education level: Not on file  Occupational History  . Occupation: unemployeed  Social Needs  . Financial resource strain: Not on file  . Food insecurity:    Worry: Not on file    Inability: Not on file  . Transportation needs:    Medical: Not on file    Non-medical: Not on file  Tobacco Use  . Smoking status: Never Smoker  . Smokeless tobacco: Never Used  Substance and Sexual Activity  . Alcohol use: Yes    Comment: some  . Drug use: No  . Sexual activity: Never  Lifestyle  . Physical activity:    Days per week: Not on file    Minutes per session: Not on file  . Stress: Not on file  Relationships  . Social connections:    Talks on phone:  Not on file    Gets together: Not on file    Attends religious service: Not on file    Active member of club or organization: Not on file    Attends meetings of clubs or organizations: Not on file    Relationship status: Not on file  . Intimate partner violence:    Fear of current or ex partner: Not on file    Emotionally abused: Not on file    Physically abused: Not on file    Forced sexual activity: Not on file  Other Topics Concern  . Not on file  Social History Narrative   Admits that he eats poorly and eats a lot at work.      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    Physical Exam   Constitutional: He is oriented to person, place, and time.  Cardiovascular: Normal rate. An irregularly irregular rhythm present.  Pulmonary/Chest: Effort normal and breath sounds normal.  Musculoskeletal: Normal range of motion. He exhibits no edema.  Neurological: He is alert and oriented to person, place, and time.  Skin: Skin is warm and dry.  Psychiatric: He has a normal mood and affect.        Future Appointments  Date Time Provider [place]  01/13/2018 11:30 AM Constance Haw, MD [facility]  01/22/2018 10:30 AM [facility] PA/NP [facility] None  02/14/2018  8:00 PM [facility] ROOM 3 [facility]  03/10/2018  2:00 PM [name], FNP [facility] None    BP 108/72 (BP Location: Left Arm, Patient Position: Sitting, Cuff Size: Large)   Pulse 90   Resp 18   Wt (!) 319 lb 8 oz (144.9 kg)   SpO2 96%   BMI 41.02 kg/m   Weight yesterday- 319 lb Last visit weight-  Darius [name] was seen at the [facility] today and reported feeling well. He denied chest pain, SOB, headache, dizziness or orthopnea/. He stated he has been compliant with his medications over the past week and has not missed a single dose. His HR was found to be irregular and a 12 lead ECG was obtained at the request of the HF clinic. He was noted to be in atrial fibrillation which was the same as last week. I advised Amy Clegg, NP-C who said he would need to be on Eliquis for one month before cardioversion could be completed. His medications were verified and his pillbox was refilled.   Jacquiline Doe, EMT 12/31/17  ACTION: Home visit completed Next visit planned for 1 week

## 2018-01-06 ENCOUNTER — Other Ambulatory Visit (HOSPITAL_COMMUNITY): Payer: Self-pay

## 2018-01-06 NOTE — Progress Notes (Signed)
Paramedicine Encounter    Patient ID: Darius [name], male    DOB: March 12, 1955, 62 y.o.   MRN: [id number]   Patient Care Team: Darius [name], [place] as PCP - General (Family Medicine) Darius Haw, MD as PCP - Electrophysiology (Cardiology) Darius Dresser, MD as Consulting Physician (Cardiology)  Patient Active Problem List   Diagnosis Date Noted  . Urine leukocytes 05/24/2017  . Atrial fibrillation with RVR ([place]) 04/17/2016  . Elevated troponin 04/17/2016  . CHF (congestive heart failure) ([place]) 04/17/2016  . Septic olecranon bursitis of right elbow 09/19/2015  . Lactic acid acidosis   . Dilated cardiomyopathy ([place]) 04/19/2014  . Type 2 diabetes mellitus ([place]) 04/09/2014  . Non compliance with medical treatment and diet 04/09/2014  . Chronic anticoagulation-Eliquis started 04/06/14 04/09/2014  . Abnormal chest CT-LUL nodule- needs f/u May 2016 04/09/2014  . Acute on chronic combined systolic and diastolic heart failure ([place]) 04/05/2014  . OSA - C-pap 03/22/2013  . Persistent atrial fibrillation ([place]) 03/27/2011  . Obesity, Class III, BMI 40-49.9 (morbid obesity) ([place]) 03/27/2011  . URI, acute 03/27/2011    Current Outpatient Medications:  .  amiodarone (PACERONE) 200 MG tablet, Take 1 tablet (200 mg total) by mouth 2 (two) times daily., Disp: 60 tablet, Rfl: 2 .  apixaban (ELIQUIS) 5 MG TABS tablet, Take 1 tablet (5 mg total) by mouth 2 (two) times daily., Disp: 60 tablet, Rfl: 5 .  atorvastatin (LIPITOR) 80 MG tablet, TAKE 1 TABLET (80 MG TOTAL) BY MOUTH AT BEDTIME., Disp: 30 tablet, Rfl: 11 .  furosemide (LASIX) 40 MG tablet, TAKE 1 TABLET BY MOUTH ONCE DAILY, Disp: 100 tablet, Rfl: 2 .  ketoconazole (NIZORAL) 2 % shampoo, Apply 1 application topically 2 (two) times a week., Disp: 120 mL, Rfl: 0 .  metFORMIN (GLUCOPHAGE) 1000 MG tablet, TAKE 1 TABLET BY MOUTH TWICE A DAY WITH FOOD, Disp: 60 tablet, Rfl: 3 .  metoprolol succinate (TOPROL-XL) 50 MG 24 hr tablet, TAKE 1 & 1/2  TABLET BY MOUTH TWICE DAILY WITH OR IMMEDIATELY FOLLOWING A MEAL, Disp: 90 tablet, Rfl: 5 .  nystatin-triamcinolone ointment (MYCOLOG), Apply 1 application topically 2 (two) times daily., Disp: 60 g, Rfl: 1 .  sacubitril-valsartan (ENTRESTO) 97-103 MG, Take 1 tablet by mouth 2 (two) times daily., Disp: 180 tablet, Rfl: 3 .  spironolactone (ALDACTONE) 25 MG tablet, TAKE 1 TABLET BY MOUTH ONCE DAILY, Disp: 30 tablet, Rfl: 5 No Known Allergies    Social History   Socioeconomic History  . Marital status: Single    Spouse name: Not on file  . Number of children: Not on file  . Years of education: Not on file  . Highest education level: Not on file  Occupational History  . Occupation: unemployeed  Social Needs  . Financial resource strain: Not on file  . Food insecurity:    Worry: Not on file    Inability: Not on file  . Transportation needs:    Medical: Not on file    Non-medical: Not on file  Tobacco Use  . Smoking status: Never Smoker  . Smokeless tobacco: Never Used  Substance and Sexual Activity  . Alcohol use: Yes    Comment: some  . Drug use: No  . Sexual activity: Never  Lifestyle  . Physical activity:    Days per week: Not on file    Minutes per session: Not on file  . Stress: Not on file  Relationships  . Social connections:    Talks on phone:  Not on file    Gets together: Not on file    Attends religious service: Not on file    Active member of club or organization: Not on file    Attends meetings of clubs or organizations: Not on file    Relationship status: Not on file  . Intimate partner violence:    Fear of current or ex partner: Not on file    Emotionally abused: Not on file    Physically abused: Not on file    Forced sexual activity: Not on file  Other Topics Concern  . Not on file  Social History Narrative   Admits that he eats poorly and eats a lot at work.      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    Physical Exam   Constitutional: He is oriented to person, place, and time.  Cardiovascular: Normal rate and regular rhythm.  Pulmonary/Chest: Effort normal and breath sounds normal.  Abdominal: Soft.  Musculoskeletal: Normal range of motion. He exhibits edema.  Neurological: He is alert and oriented to person, place, and time.  Skin: Skin is warm and dry.  Psychiatric: He has a normal mood and affect.        Future Appointments  Date Time Provider [place]  01/13/2018 11:45 AM Darius Haw, MD [facility]  01/22/2018 10:30 AM [facility] PA/NP [facility] None  02/14/2018  8:00 PM [facility] ROOM 3 [facility]  03/10/2018  2:00 PM Darius Boast, FNP [facility] None    BP 126/80 (BP Location: Right Arm, Patient Position: Sitting, Cuff Size: Large)   Pulse 70   Resp 16   Wt (!) 320 lb (145.2 kg)   SpO2 97%   BMI 41.09 kg/m   Weight yesterday- 319 lb Last visit weight- 318.5 lb  [name] was seen at the [facility] on [facility] today. He reported feeling well, denying chest pain, SOB, headache, dizziness or orthopnea. He reported being compliant with his medications however upon preparing to fill his pillbox, I noted that he had missed one dose of Eliquis and potassium. He stated that he "must not have seen those" while taking his medicine because it is dark when he usually takes them. I stressed the importance of taking his medications and he said he understood. He continues to have poor dietary habits despite multiple attempts at nutritional education. His medications were verified and his pillbox was refilled. He was noted to be in a regular rhythm today however an ECG was not obtained. I will follow up next week to see if her is in NSR.   Darius [name], EMT 01/06/18  ACTION: Home visit completed Next visit planned for 1 week

## 2018-01-13 ENCOUNTER — Encounter: Payer: Self-pay | Admitting: Cardiology

## 2018-01-13 ENCOUNTER — Telehealth (HOSPITAL_COMMUNITY): Payer: Self-pay

## 2018-01-13 ENCOUNTER — Other Ambulatory Visit (HOSPITAL_COMMUNITY): Payer: Self-pay

## 2018-01-13 ENCOUNTER — Ambulatory Visit (INDEPENDENT_AMBULATORY_CARE_PROVIDER_SITE_OTHER): Payer: Medicaid Other | Admitting: Cardiology

## 2018-01-13 VITALS — BP 110/76 | HR 69 | Ht 74.0 in | Wt 322.6 lb

## 2018-01-13 DIAGNOSIS — I4819 Other persistent atrial fibrillation: Secondary | ICD-10-CM | POA: Diagnosis not present

## 2018-01-13 DIAGNOSIS — I5022 Chronic systolic (congestive) heart failure: Secondary | ICD-10-CM | POA: Diagnosis not present

## 2018-01-13 DIAGNOSIS — E785 Hyperlipidemia, unspecified: Secondary | ICD-10-CM

## 2018-01-13 DIAGNOSIS — G4733 Obstructive sleep apnea (adult) (pediatric): Secondary | ICD-10-CM

## 2018-01-13 NOTE — Progress Notes (Signed)
Paramedicine Encounter    Patient ID: Darius [name], male    DOB: Jun 10, 1955, 62 y.o.   MRN: [id number]   Patient Care Team: Scot Jun, FNP as PCP - General (Family Medicine) Constance Haw, MD as PCP - Electrophysiology (Cardiology) [name], MD as Consulting Physician (Cardiology)  Patient Active Problem List   Diagnosis Date Noted  . Urine leukocytes 05/24/2017  . Atrial fibrillation with RVR ([place]) 04/17/2016  . Elevated troponin 04/17/2016  . CHF (congestive heart failure) ([place]) 04/17/2016  . Septic olecranon bursitis of right elbow 09/19/2015  . Lactic acid acidosis   . Dilated cardiomyopathy ([place]) 04/19/2014  . Type 2 diabetes mellitus ([place]) 04/09/2014  . Non compliance with medical treatment and diet 04/09/2014  . Chronic anticoagulation-Eliquis started 04/06/14 04/09/2014  . Abnormal chest CT-LUL nodule- needs f/u May 2016 04/09/2014  . Acute on chronic combined systolic and diastolic heart failure ([place]) 04/05/2014  . OSA - C-pap 03/22/2013  . Persistent atrial fibrillation ([place]) 03/27/2011  . Obesity, Class III, BMI 40-49.9 (morbid obesity) ([place]) 03/27/2011  . URI, acute 03/27/2011    Current Outpatient Medications:  .  amiodarone (PACERONE) 200 MG tablet, Take 1 tablet (200 mg total) by mouth 2 (two) times daily., Disp: 60 tablet, Rfl: 2 .  apixaban (ELIQUIS) 5 MG TABS tablet, Take 1 tablet (5 mg total) by mouth 2 (two) times daily., Disp: 60 tablet, Rfl: 5 .  atorvastatin (LIPITOR) 80 MG tablet, TAKE 1 TABLET (80 MG TOTAL) BY MOUTH AT BEDTIME., Disp: 30 tablet, Rfl: 11 .  furosemide (LASIX) 40 MG tablet, TAKE 1 TABLET BY MOUTH ONCE DAILY, Disp: 100 tablet, Rfl: 2 .  ketoconazole (NIZORAL) 2 % shampoo, Apply 1 application topically 2 (two) times a week., Disp: 120 mL, Rfl: 0 .  metFORMIN (GLUCOPHAGE) 1000 MG tablet, TAKE 1 TABLET BY MOUTH TWICE A DAY WITH FOOD, Disp: 60 tablet, Rfl: 3 .  metoprolol succinate (TOPROL-XL) 50 MG 24 hr tablet, TAKE 1 &  1/2 TABLET BY MOUTH TWICE DAILY WITH OR IMMEDIATELY FOLLOWING A MEAL, Disp: 90 tablet, Rfl: 5 .  nystatin-triamcinolone ointment (MYCOLOG), Apply 1 application topically 2 (two) times daily., Disp: 60 g, Rfl: 1 .  sacubitril-valsartan (ENTRESTO) 97-103 MG, Take 1 tablet by mouth 2 (two) times daily., Disp: 180 tablet, Rfl: 3 .  spironolactone (ALDACTONE) 25 MG tablet, TAKE 1 TABLET BY MOUTH ONCE DAILY, Disp: 30 tablet, Rfl: 5 No Known Allergies    Social History   Socioeconomic History  . Marital status: Single    Spouse name: Not on file  . Number of children: Not on file  . Years of education: Not on file  . Highest education level: Not on file  Occupational History  . Occupation: unemployeed  Social Needs  . Financial resource strain: Not on file  . Food insecurity:    Worry: Not on file    Inability: Not on file  . Transportation needs:    Medical: Not on file    Non-medical: Not on file  Tobacco Use  . Smoking status: Never Smoker  . Smokeless tobacco: Never Used  Substance and Sexual Activity  . Alcohol use: Yes    Comment: some  . Drug use: No  . Sexual activity: Never  Lifestyle  . Physical activity:    Days per week: Not on file    Minutes per session: Not on file  . Stress: Not on file  Relationships  . Social connections:    Talks on  phone: Not on file    Gets together: Not on file    Attends religious service: Not on file    Active member of club or organization: Not on file    Attends meetings of clubs or organizations: Not on file    Relationship status: Not on file  . Intimate partner violence:    Fear of current or ex partner: Not on file    Emotionally abused: Not on file    Physically abused: Not on file    Forced sexual activity: Not on file  Other Topics Concern  . Not on file  Social History Narrative   Admits that he eats poorly and eats a lot at work.      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    Physical Exam   Constitutional: He is oriented to person, place, and time.  Cardiovascular: Normal rate and regular rhythm.  Pulmonary/Chest: Effort normal and breath sounds normal.  Abdominal: Soft. Bowel sounds are normal.  Musculoskeletal: Normal range of motion.  Neurological: He is alert and oriented to person, place, and time.  Skin: Skin is warm and dry.  Psychiatric: He has a normal mood and affect.        Future Appointments  Date Time Provider [place]  01/22/2018 10:30 AM [facility] PA/NP [facility] None  02/14/2018  8:00 PM [facility] ROOM 3 [facility]  03/10/2018  2:00 PM [name], FNP [facility] None    BP 110/76 (BP Location: Left Arm, Patient Position: Sitting, Cuff Size: Large)   Pulse 69   Resp 18   Wt (!) 322 lb (146.1 kg)   SpO2 93%   BMI 41.34 kg/m   Weight yesterday- 319 lb Last visit weight- 320 lb  Mr Darius [name] was seen at the [facility] today and reported feeling well. He denied chest pain, SOB, headache, dizziness or orthopnea. He reported being compliant with his medications however when I was preparing to fill his pillbox, I noted that he had missed a day. He stated he had left his pillbox in his car over the weekend and "one of the days it rained" he "didn't want to go in the rain out to get them." He remains in a normal rhythm which he reports was noted at his cardiology appointment today. I did not confirm this with my own 12 lead however he had a regular radial rhythm. His medications were verified and his pillbox was refilled.   Jacquiline Doe, EMT 01/13/18  ACTION: Home visit completed Next visit planned for 1 week

## 2018-01-13 NOTE — Telephone Encounter (Signed)
I called Darius [name] to schedule an appointment. He stated he would be able to meet this afternoon. We agreed to meet at [employer] at 15:30.

## 2018-01-13 NOTE — Progress Notes (Signed)
Electrophysiology Office Note   Date:  01/13/2018   ID:  Darius [name], DOB 1955-07-14, MRN [id number]  PCP:  Scot Jun, FNP  Cardiologist:  [name] Primary Electrophysiologist:  Jarious Lyon Meredith Leeds, MD    No chief complaint on file.    History of Present Illness: Darius [name] is a 61 y.o. male who is being seen today for the evaluation of atrial fibrillation at the request of Loralie Champagne. Presenting today for electrophysiology evaluation.  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  04/2016 he was admitted for acute systolic heart failure and atrial fibrillation with rapid rates.  Right and left heart catheterization showed no coronary disease and elevated filling pressures.  Echo showed an EF of 15%.  He was found to have a left atrial appendage thrombus and was started on Eliquis.  He had a cardioversion 08/2016 with reverted to sinus rhythm.  Echo showed an EF of 40 to 45%.  Most recent echo shows a normal ejection fraction.  Today, denies symptoms of palpitations, chest pain, shortness of breath, orthopnea, PND, lower extremity edema, claudication, dizziness, presyncope, syncope, bleeding, or neurologic sequela. The patient is tolerating medications without difficulties.  He currently feels well without chest pain or shortness of breath.  He has a sleep study planned for January 5.  He is able to do all of his daily activities.  Past Medical History:  Diagnosis Date  . Atrial fibrillation ([place])    unable to tolerate versed and fentanyl sedation and required gen anesthesia for TEE;  s/p TEE-DCCV (failed);  Amiodarone started  . Chronic diastolic heart failure ([place])    a. Echo 03/27/11: EF 50-55%, inferior hypokinesis, moderate LAE, mild RVE, normal pulmonary pressures. ;   b.  TEE (04/03/11): EF 40-45%, mild MR moderate LAE, no LAA clot, mild RAE;  c.   Echocardiogram (02/2013): EF 60-65%, Gr 2 DD, mildly dilated Ao root (Ao root dimension 39 mm), MAC, mild LAE, normal RVF  . Diabetes mellitus, type 2 ([place])   . Hx of cardiovascular stress test    Lexiscan Myoview (02/2013): No ischemia or scar, EF 51%, low risk  . Morbid obesity ([place])   . Sleep apnea    a. sleep test 01/2013:  very severy OSA   Past Surgical History:  Procedure Laterality Date  . CARDIOVERSION  04/01/2011   Procedure: CARDIOVERSION;  Surgeon: Lelon Perla, MD;  Location: [place];  Service: Cardiovascular;  Laterality: N/A;  . CARDIOVERSION  04/03/2011   Procedure: CARDIOVERSION;  Surgeon: Lelon Perla, MD;  Location: [place];  Service: Cardiovascular;  Laterality: N/A;  . CARDIOVERSION  04/03/2011   Procedure: CARDIOVERSION;  Surgeon: Lelon Perla, MD;  Location: [place];  Service: Cardiovascular;  Laterality: N/A;  . CARDIOVERSION N/A 06/15/2014   Procedure: CARDIOVERSION;  Surgeon: [name], MD;  Location: [facility] ENDOSCOPY;  Service: Cardiovascular;  Laterality: N/A;  . CARDIOVERSION N/A 09/08/2016   Procedure: CARDIOVERSION;  Surgeon: [name], MD;  Location: [facility] ENDOSCOPY;  Service: Cardiovascular;  Laterality: N/A;  . I&D EXTREMITY Right 09/19/2015   Procedure: IRRIGATION AND DEBRIDEMENT EXTREMITY;  Surgeon: Mcarthur Rossetti, MD;  Location: [place];  Service: Orthopedics;  Laterality: Right;  . RIGHT/LEFT HEART CATH AND CORONARY ANGIOGRAPHY N/A 04/21/2016   Procedure: Right/Left Heart Cath and Coronary Angiography;  Surgeon: [name], MD;  Location: [place] CV LAB;  Service: Cardiovascular;  Laterality:  N/A;  . TEE WITHOUT CARDIOVERSION  04/01/2011   Procedure: TRANSESOPHAGEAL ECHOCARDIOGRAM (TEE);  Surgeon: Lelon Perla, MD;  Location[facility] ENDOSCOPY;  Service: Cardiovascular;  Laterality: N/A;  . TEE WITHOUT CARDIOVERSION  04/03/2011   Procedure: TRANSESOPHAGEAL ECHOCARDIOGRAM (TEE);  Surgeon: Lelon Perla, MD;  Location: [facility]  ENDOSCOPY;  Service: Cardiovascular;  Laterality: N/A;  . TEE WITHOUT CARDIOVERSION  04/03/2011   Procedure: TRANSESOPHAGEAL ECHOCARDIOGRAM (TEE);  Surgeon: Lelon Perla, MD;  Location: [place];  Service: Cardiovascular;  Laterality: N/A;  . TEE WITHOUT CARDIOVERSION N/A 04/24/2016   Procedure: TRANSESOPHAGEAL ECHOCARDIOGRAM (TEE);  Surgeon: [name], MD;  Location: [place];  Service: Cardiovascular;  Laterality: N/A;  . TEE WITHOUT CARDIOVERSION N/A 05/26/2016   Procedure: TRANSESOPHAGEAL ECHOCARDIOGRAM (TEE);  Surgeon: [name], MD;  Location: [place];  Service: Cardiovascular;  Laterality: N/A;  . TEE WITHOUT CARDIOVERSION N/A 09/08/2016   Procedure: TRANSESOPHAGEAL ECHOCARDIOGRAM (TEE);  Surgeon: [name], MD;  Location: [facility] ENDOSCOPY;  Service: Cardiovascular;  Laterality: N/A;     Current Outpatient Medications  Medication Sig Dispense Refill  . amiodarone (PACERONE) 200 MG tablet Take 1 tablet (200 mg total) by mouth 2 (two) times daily. 60 tablet 2  . apixaban (ELIQUIS) 5 MG TABS tablet Take 1 tablet (5 mg total) by mouth 2 (two) times daily. 60 tablet 5  . atorvastatin (LIPITOR) 80 MG tablet TAKE 1 TABLET (80 MG TOTAL) BY MOUTH AT BEDTIME. 30 tablet 11  . furosemide (LASIX) 40 MG tablet TAKE 1 TABLET BY MOUTH ONCE DAILY 100 tablet 2  . ketoconazole (NIZORAL) 2 % shampoo Apply 1 application topically 2 (two) times a week. 120 mL 0  . metFORMIN (GLUCOPHAGE) 1000 MG tablet TAKE 1 TABLET BY MOUTH TWICE A DAY WITH FOOD 60 tablet 3  . metoprolol succinate (TOPROL-XL) 50 MG 24 hr tablet TAKE 1 & 1/2 TABLET BY MOUTH TWICE DAILY WITH OR IMMEDIATELY FOLLOWING A MEAL 90 tablet 5  . nystatin-triamcinolone ointment (MYCOLOG) Apply 1 application topically 2 (two) times daily. 60 g 1  . sacubitril-valsartan (ENTRESTO) 97-103 MG Take 1 tablet by mouth 2 (two) times daily. 180 tablet 3  . spironolactone (ALDACTONE) 25 MG tablet TAKE 1 TABLET BY MOUTH ONCE DAILY 30 tablet 5    No current facility-administered medications for this visit.     Allergies:   Patient has no known allergies.   Social History:  The patient  reports that he has never smoked. He has never used smokeless tobacco. He reports that he drinks alcohol. He reports that he does not use drugs.   Family History:  The patient's family history includes Diabetes in his father; Hypertension in his father, paternal grandfather, and sister; Seizures in his mother.    ROS:  Please see the history of present illness.   Otherwise, review of systems is positive for none.   All other systems are reviewed and negative.   PHYSICAL EXAM: VS:  BP 110/76   Pulse 69   Ht _0  (1.88 m)   Wt (!) 322 lb 9.6 oz (146.3 kg)   SpO2 93%   BMI 41.42 kg/m  , BMI Body mass index is 41.42 kg/m. GEN: Well nourished, well developed, in no acute distress  HEENT: normal  Neck: no JVD, carotid bruits, or masses Cardiac: RRR; no murmurs, rubs, or gallops,no edema  Respiratory:  clear to auscultation bilaterally, normal work of breathing GI: soft, nontender, nondistended, + BS MS: no deformity or atrophy  Skin:  warm and dry Neuro:  Strength and sensation are intact Psych: euthymic mood, full affect  EKG:  EKG is ordered today. Personal review of the ekg ordered shows sinus rhythm, 69  Recent Labs: 02/13/2017: BNP 129.4 10/02/2017: TSH 0.083 12/07/2017: ALT 18; BUN 20; Creatinine, Ser 1.33; Potassium 4.2; Sodium 138    Lipid Panel     Component Value Date/Time   CHOL 120 12/12/2016 0928   TRIG 77 12/12/2016 0928   HDL 62 12/12/2016 0928   CHOLHDL 1.9 12/12/2016 0928   VLDL 17 04/18/2016 1459   LDLCALC 42 12/12/2016 0928     Wt Readings from Last 3 Encounters:  01/13/18 (!) 322 lb 9.6 oz (146.3 kg)  01/06/18 (!) 320 lb (145.2 kg)  12/31/17 (!) 319 lb 8 oz (144.9 kg)      Other studies Reviewed: Additional studies/ records that were reviewed today include: TTE 09/11/17  Review of the above records  today demonstrates:  - Left ventricle: The cavity size was normal. Wall thickness was   increased in a pattern of mild LVH. Systolic function was normal.   The estimated ejection fraction was in the range of 55% to 60%.   Although no diagnostic regional wall motion abnormality was   identified, this possibility cannot be completely excluded on the   basis of this study. GLS -19.1%. - Aortic valve: There was no stenosis. - Mitral valve: There was no significant regurgitation. - Right ventricle: The cavity size was normal. Systolic function   was normal. - Pulmonary arteries: No complete TR doppler jet so unable to   estimate PA systolic pressure. - Inferior vena cava: The vessel was normal in size. The   respirophasic diameter changes were in the normal range (>= 50%),   consistent with normal central venous pressure.   ASSESSMENT AND PLAN:  1.  Persistent atrial fibrillation: On amiodarone and Eliquis.  He appears to be maintaining sinus rhythm.  He has a sleep study coming up in January.  I Elijan Googe see him back after his sleep study to further discuss the possibility of A. fib ablation.   This patients CHA2DS2-VASc Score and unadjusted Ischemic Stroke Rate (% per year) is equal to 2.2 % stroke rate/year from a score of 2  Above score calculated as 1 point each if present [CHF, HTN, DM, Vascular=MI/PAD/Aortic Plaque, Age if 65-74, or Male] Above score calculated as 2 points each if present [Age > 75, or Stroke/TIA/TE]   2.  Chronic systolic heart failure: Currently on Entresto, Aldactone, and Toprol-XL.  No changes.  3.  Hyperlipidemia: Continue statin  4.  Obstructive sleep apnea: Has been noncompliant in the past.  Repeat sleep study planned for January 5.  Current medicines are reviewed at length with the patient today.   The patient does not have concerns regarding his medicines.  The following changes were made today:  none  Labs/ tests ordered today include:  Orders Placed  This Encounter  Procedures  . EKG 12-Lead    Disposition:   FU with Zuhair Lariccia 2 months  Signed, Chesney Suares Meredith Leeds, MD  01/13/2018 11:50 AM     [facility] [address] [place] [address] [phone] (office) [phone] (fax)

## 2018-01-13 NOTE — Patient Instructions (Signed)
Medication Instructions:  Your physician recommends that you continue on your current medications as directed. Please refer to the Current Medication list given to you today.  If you need a refill on your cardiac medications before your next appointment, please call your pharmacy.   Lab work: None ordered  Testing/Procedures: None ordered  Follow-Up: Your physician recommends that you schedule a follow-up appointment in: February with Dr. Curt Bears, after you have completed your sleep study.  Thank you for choosing [facility]!!   Trinidad Curet, RN [phone]  Any Other Special Instructions Will Be Listed Below (If Applicable).

## 2018-01-21 ENCOUNTER — Other Ambulatory Visit (HOSPITAL_COMMUNITY): Payer: Self-pay

## 2018-01-21 NOTE — Progress Notes (Signed)
Paramedicine Encounter    Patient ID: Darius [name], male    DOB: Apr 07, 1955, 62 y.o.   MRN: [id number]   Patient Care Team: Darius Jun, FNP as PCP - General (Family Medicine) Darius Haw, MD as PCP - Electrophysiology (Cardiology) Darius Dresser, MD as Consulting Physician (Cardiology) Darius Ny, LCSW as Social Worker (Licensed Clinical Social Worker)  Patient Active Problem List   Diagnosis Date Noted  . Urine leukocytes 05/24/2017  . Atrial fibrillation with RVR ([place]) 04/17/2016  . Elevated troponin 04/17/2016  . CHF (congestive heart failure) ([place]) 04/17/2016  . Septic olecranon bursitis of right elbow 09/19/2015  . Lactic acid acidosis   . Dilated cardiomyopathy ([place]) 04/19/2014  . Type 2 diabetes mellitus ([place]) 04/09/2014  . Non compliance with medical treatment and diet 04/09/2014  . Chronic anticoagulation-Eliquis started 04/06/14 04/09/2014  . Abnormal chest CT-LUL nodule- needs f/u May 2016 04/09/2014  . Acute on chronic combined systolic and diastolic heart failure ([place]) 04/05/2014  . OSA - C-pap 03/22/2013  . Persistent atrial fibrillation ([place]) 03/27/2011  . Obesity, Class III, BMI 40-49.9 (morbid obesity) ([place]) 03/27/2011  . URI, acute 03/27/2011    Current Outpatient Medications:  .  amiodarone (PACERONE) 200 MG tablet, Take 1 tablet (200 mg total) by mouth 2 (two) times daily., Disp: 60 tablet, Rfl: 2 .  apixaban (ELIQUIS) 5 MG TABS tablet, Take 1 tablet (5 mg total) by mouth 2 (two) times daily., Disp: 60 tablet, Rfl: 5 .  atorvastatin (LIPITOR) 80 MG tablet, TAKE 1 TABLET (80 MG TOTAL) BY MOUTH AT BEDTIME., Disp: 30 tablet, Rfl: 11 .  furosemide (LASIX) 40 MG tablet, TAKE 1 TABLET BY MOUTH ONCE DAILY, Disp: 100 tablet, Rfl: 2 .  ketoconazole (NIZORAL) 2 % shampoo, Apply 1 application topically 2 (two) times a week., Disp: 120 mL, Rfl: 0 .  metFORMIN (GLUCOPHAGE) 1000 MG tablet, TAKE 1 TABLET BY MOUTH TWICE A DAY WITH FOOD, Disp: 60 tablet,  Rfl: 3 .  metoprolol succinate (TOPROL-XL) 50 MG 24 hr tablet, TAKE 1 & 1/2 TABLET BY MOUTH TWICE DAILY WITH OR IMMEDIATELY FOLLOWING A MEAL, Disp: 90 tablet, Rfl: 5 .  nystatin-triamcinolone ointment (MYCOLOG), Apply 1 application topically 2 (two) times daily., Disp: 60 g, Rfl: 1 .  sacubitril-valsartan (ENTRESTO) 97-103 MG, Take 1 tablet by mouth 2 (two) times daily., Disp: 180 tablet, Rfl: 3 .  spironolactone (ALDACTONE) 25 MG tablet, TAKE 1 TABLET BY MOUTH ONCE DAILY, Disp: 30 tablet, Rfl: 5 No Known Allergies    Social History   Socioeconomic History  . Marital status: Single    Spouse name: Not on file  . Number of children: Not on file  . Years of education: Not on file  . Highest education level: Not on file  Occupational History  . Occupation: unemployeed  Social Needs  . Financial resource strain: Not on file  . Food insecurity:    Worry: Not on file    Inability: Not on file  . Transportation needs:    Medical: Not on file    Non-medical: Not on file  Tobacco Use  . Smoking status: Never Smoker  . Smokeless tobacco: Never Used  Substance and Sexual Activity  . Alcohol use: Yes    Comment: some  . Drug use: No  . Sexual activity: Never  Lifestyle  . Physical activity:    Days per week: Not on file    Minutes per session: Not on file  . Stress: Not on file  Relationships  . Social connections:    Talks on phone: Not on file    Gets together: Not on file    Attends religious service: Not on file    Active member of club or organization: Not on file    Attends meetings of clubs or organizations: Not on file    Relationship status: Not on file  . Intimate partner violence:    Fear of current or ex partner: Not on file    Emotionally abused: Not on file    Physically abused: Not on file    Forced sexual activity: Not on file  Other Topics Concern  . Not on file  Social History Narrative   Admits that he eats poorly and eats a lot at work.      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    Physical Exam Cardiovascular:     Rate and Rhythm: Normal rate and regular rhythm.     Pulses: Normal pulses.  Pulmonary:     Effort: Pulmonary effort is normal.     Breath sounds: Normal breath sounds.  Musculoskeletal: Normal range of motion.     Right lower leg: No edema.     Left lower leg: No edema.  Skin:    General: Skin is warm and dry.  Neurological:     Mental Status: He is oriented to person, place, and time.  Psychiatric:        Mood and Affect: Mood normal.         Future Appointments  Date Time Provider [place]  01/22/2018 10:30 AM [facility] PA/NP [facility] None  02/14/2018  8:00 PM [facility]OM 3 [facility]  03/10/2018  2:00 PM [name], FNP [facility] None    BP 106/68 (BP Location: Right Arm, Patient Position: Sitting, Cuff Size: Large)   Pulse 70   Resp 20   Wt (!) 320 lb (145.2 kg)   SpO2 94%   BMI 41.09 kg/m   Weight yesterday- 320 lb Last visit weight- 322 lb  Darius [name] was see at the [facility] and reported feeling well. He denied chest pain, SOB, headache, dizziness or orthopnea. He reported being compliant with his medications and his weight remains stable, albeit elevated. His medications were verified and his pillbox was refilled.   Darius [name], EMT 01/21/18  ACTION: Home visit completed Next visit planned for 1 week

## 2018-01-22 ENCOUNTER — Encounter (HOSPITAL_COMMUNITY): Payer: Self-pay

## 2018-01-22 ENCOUNTER — Ambulatory Visit (HOSPITAL_COMMUNITY)
Admission: RE | Admit: 2018-01-22 | Discharge: 2018-01-22 | Disposition: A | Discharge status: Home / Self Care | Payer: Medicaid Other | Source: Ambulatory Visit | Attending: Cardiology | Admitting: Cardiology

## 2018-01-22 VITALS — BP 118/78 | HR 72 | Wt 327.0 lb

## 2018-01-22 DIAGNOSIS — Z79899 Other long term (current) drug therapy: Secondary | ICD-10-CM | POA: Diagnosis not present

## 2018-01-22 DIAGNOSIS — Z7901 Long term (current) use of anticoagulants: Secondary | ICD-10-CM | POA: Insufficient documentation

## 2018-01-22 DIAGNOSIS — Z7984 Long term (current) use of oral hypoglycemic drugs: Secondary | ICD-10-CM | POA: Diagnosis not present

## 2018-01-22 DIAGNOSIS — I480 Paroxysmal atrial fibrillation: Secondary | ICD-10-CM | POA: Insufficient documentation

## 2018-01-22 DIAGNOSIS — I5022 Chronic systolic (congestive) heart failure: Secondary | ICD-10-CM | POA: Diagnosis not present

## 2018-01-22 DIAGNOSIS — Z833 Family history of diabetes mellitus: Secondary | ICD-10-CM | POA: Insufficient documentation

## 2018-01-22 DIAGNOSIS — E785 Hyperlipidemia, unspecified: Secondary | ICD-10-CM | POA: Insufficient documentation

## 2018-01-22 DIAGNOSIS — I2510 Atherosclerotic heart disease of native coronary artery without angina pectoris: Secondary | ICD-10-CM | POA: Diagnosis not present

## 2018-01-22 DIAGNOSIS — I428 Other cardiomyopathies: Secondary | ICD-10-CM | POA: Diagnosis not present

## 2018-01-22 DIAGNOSIS — G4733 Obstructive sleep apnea (adult) (pediatric): Secondary | ICD-10-CM | POA: Diagnosis not present

## 2018-01-22 DIAGNOSIS — Z8249 Family history of ischemic heart disease and other diseases of the circulatory system: Secondary | ICD-10-CM | POA: Diagnosis not present

## 2018-01-22 DIAGNOSIS — I509 Heart failure, unspecified: Secondary | ICD-10-CM

## 2018-01-22 DIAGNOSIS — Z6841 Body Mass Index (BMI) 40.0 and over, adult: Secondary | ICD-10-CM | POA: Insufficient documentation

## 2018-01-22 DIAGNOSIS — I4891 Unspecified atrial fibrillation: Secondary | ICD-10-CM | POA: Diagnosis not present

## 2018-01-22 DIAGNOSIS — E119 Type 2 diabetes mellitus without complications: Secondary | ICD-10-CM | POA: Insufficient documentation

## 2018-01-22 LAB — COMPREHENSIVE METABOLIC PANEL
ALBUMIN: 3.5 g/dL (ref 3.5–5.0)
ALT: 25 U/L (ref 0–44)
AST: 18 U/L (ref 15–41)
Alkaline Phosphatase: 52 U/L (ref 38–126)
Anion gap: 12 (ref 5–15)
BUN: 18 mg/dL (ref 8–23)
CHLORIDE: 101 mmol/L (ref 98–111)
CO2: 24 mmol/L (ref 22–32)
Calcium: 8.9 mg/dL (ref 8.9–10.3)
Creatinine, Ser: 1.1 mg/dL (ref 0.61–1.24)
GFR calc Af Amer: >60 mL/min (ref >60)
GFR calc non Af Amer: >60 mL/min (ref >60)
GLUCOSE: 191 mg/dL — AB (ref 70–99)
POTASSIUM: 4.1 mmol/L (ref 3.5–5.1)
Sodium: 137 mmol/L (ref 135–145)
Total Bilirubin: 0.7 mg/dL (ref 0.3–1.2)
Total Protein: 6.6 g/dL (ref 6.5–8.1)

## 2018-01-22 MED ORDER — AMIODARONE HCL 200 MG PO TABS: 200.0000 mg | ORAL_TABLET | Freq: Every day | ORAL | 2 refills | Status: DC

## 2018-01-22 MED FILL — AMIODARONE HCL 200 MG TAB: 200 | 30 days supply | Qty: 30 | Fill #0

## 2018-01-22 NOTE — Patient Instructions (Signed)
DECREASE Amiodarone to 200 mg, one tab daily   Labs today We will only contact you if something comes back abnormal or we need to make some changes. Otherwise no news is good news!   Your physician recommends that you schedule a follow-up appointment in: 4 months with [name]  Do the following things EVERYDAY: 1) Weigh yourself in the morning before breakfast. Write it down and keep it in a log. 2) Take your medicines as prescribed 3) Eat low salt foods-Limit salt (sodium) to 2000 mg per day.  4) Stay as active as you can everyday 5) Limit all fluids for the day to less than 2 liters

## 2018-01-22 NOTE — Progress Notes (Signed)
Advanced Heart Failure Clinic Note   PCP: Colin Benton, DO  HF Cardiologist:  Dr. [name]  HPI: Mr. Orrego is a 62 y.o. 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   Admitted 03/18/92-8/54/62 for acute systolic CHF and atrial fibrillation with RVR. Had a R/L heart cath on 04/21/16, no CAD, right heart pressures elevated, low output. Echo showed EF 15%. Plan was to undergo TEE/DC-CV, however he was found to have a left atrial appendage thrombus and had only started Eliquis 5 days prior. He was discharged with plans for TEE/DC-CV in about a month. He was started on Entresto 24/26 mg BID during admission. Discharged home on Lasix 40 mg daily, digoxin 0.12m, and metoprolol XL 50 mg BID. Discharge 280 pounds.   Repeat TEE done in 4/18 for possible cardioversion.  EF remained 25-30%.  He still had a small, amorphous LA appendage thrombus (smaller than prior but still present).  He admitted to having missed some Eliquis doses.    He finally had TEE-guided DCCV in 7/18 with conversion to NSR.  TEE showed EF 40-45%.   On 10/13/17 he was evaluated by Dr CCurt Bearsfor possible A fib ablation. He was not a candidate at that time due to compliance issues with CPAP.   He saw Dr TRadford Paxon 12/17/17 for OSA. He is being set up for another sleep study.   He presents today for regular follow up. Feeling good overall. Verbalizes frustration about his weight gain over the past couple years. Denies any SOB/PND/Orthopnea. Has sleep study early January. Not very active, mowed his yard a few weeks ago on a riding mower, but this is the extent of his activity. Lasix is very effective. Hasn't needed any lasix. Paramedicine following. Denies lightheadedness or dizziness.   EKG today with NSR 66 bpm, personally reviewed.   Cardiac Studies  RHC/LHC 04/21/2016  RA mean 14 RV 45/15 PA 50/28, mean 36 PCWP mean  22 LV 104/20 AO 101/75 Oxygen saturations: PA 59% AO 92% Cardiac Output (Fick) 4.85  Cardiac Index (Fick) 1.89 PVR 2.9 WU  - TEE: 04/2016 with LAA thrombus.  - TEE: 4/18 with EF 25-30%, diffuse hypokinesis, RV mildly dilated with moderately decreased systolic function, amorphous thrombus LA appendage (improved compared to 3/18).  -TEE 09/08/2016 EF 40-45% No thrombus. Successful DC-CV  - ECHO 04/24/2016 EF 15% Peak PA pressure 38 mm hg  -ECHO 09/11/2017: EF 55-60% RV normal.   Labs  04/24/2016: K 4.5 Creatinine 1.00, digoxin < 0.2 4/18: K 4.3, creatinine 1.02 10/18: Thyroid panel normal, K 4.6, creatinine 1.05, LFTs normal, hgb 14.4 11/18: LDL 42, HDL 62 1/19: K 4.6, creatinine 0.97, LFTs normal, BNP 129  Review of systems complete and found to be negative unless listed in HPI.    SH:  Social History   Socioeconomic History  . Marital status: Single    Spouse name: Not on file  . Number of children: Not on file  . Years of education: Not on file  . Highest education level: Not on file  Occupational History  . Occupation: unemployeed  Social Needs  . Financial resource strain: Not on file  . Food insecurity:    Worry: Not on file    Inability: Not on file  . Transportation needs:    Medical: Not on file    Non-medical: Not on file  Tobacco Use  . Smoking status: Never Smoker  .  Smokeless tobacco: Never Used  Substance and Sexual Activity  . Alcohol use: Yes    Comment: some  . Drug use: No  . Sexual activity: Never  Lifestyle  . Physical activity:    Days per week: Not on file    Minutes per session: Not on file  . Stress: Not on file  Relationships  . Social connections:    Talks on phone: Not on file    Gets together: Not on file    Attends religious service: Not on file    Active member of club or organization: Not on file    Attends meetings of clubs or organizations: Not on file    Relationship status: Not on file  . Intimate partner violence:    Fear  of current or ex partner: Not on file    Emotionally abused: Not on file    Physically abused: Not on file    Forced sexual activity: Not on file  Other Topics Concern  . Not on file  Social History Narrative   Admits that he eats poorly and eats a lot at work.      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 Diabetes Father        died in his 2s  . Hypertension Father   . Seizures Mother        died in her 76s  . Hypertension Sister   . Hypertension Paternal Grandfather   . Heart attack Neg Hx   . Stroke Neg Hx     Past Medical History:  Diagnosis Date  . Atrial fibrillation ([place])    unable to tolerate versed and fentanyl sedation and required gen anesthesia for TEE;  s/p TEE-DCCV (failed);  Amiodarone started  . Chronic diastolic heart failure ([place])    a. Echo 03/27/11: EF 50-55%, inferior hypokinesis, moderate LAE, mild RVE, normal pulmonary pressures. ;   b.  TEE (04/03/11): EF 40-45%, mild MR moderate LAE, no LAA clot, mild RAE;  c.  Echocardiogram (02/2013): EF 60-65%, Gr 2 DD, mildly dilated Ao root (Ao root dimension 39 mm), MAC, mild LAE, normal RVF  . Diabetes mellitus, type 2 ([place])   . Hx of cardiovascular stress test    Lexiscan Myoview (02/2013): No ischemia or scar, EF 51%, low risk  . Morbid obesity ([place])   . Sleep apnea    a. sleep test 01/2013:  very severy OSA    Current Outpatient Medications  Medication Sig Dispense Refill  . amiodarone (PACERONE) 200 MG tablet Take 1 tablet (200 mg total) by mouth 2 (two) times daily. 60 tablet 2  . apixaban (ELIQUIS) 5 MG TABS tablet Take 1 tablet (5 mg total) by mouth 2 (two) times daily. 60 tablet 5  . atorvastatin (LIPITOR) 80 MG tablet TAKE 1 TABLET (80 MG TOTAL) BY MOUTH AT BEDTIME. 30 tablet 11  . furosemide (LASIX) 40 MG tablet TAKE 1 TABLET BY MOUTH ONCE DAILY 100 tablet 2  . ketoconazole (NIZORAL) 2 % shampoo Apply 1 application  topically 2 (two) times a week. 120 mL 0  . metFORMIN (GLUCOPHAGE) 1000 MG tablet TAKE 1 TABLET BY MOUTH TWICE A DAY WITH FOOD 60 tablet 3  . metoprolol succinate (TOPROL-XL) 50 MG 24 hr tablet TAKE 1 & 1/2 TABLET BY MOUTH TWICE DAILY WITH OR IMMEDIATELY FOLLOWING A MEAL 90 tablet 5  . nystatin-triamcinolone ointment (MYCOLOG) Apply 1 application  topically 2 (two) times daily. 60 g 1  . sacubitril-valsartan (ENTRESTO) 97-103 MG Take 1 tablet by mouth 2 (two) times daily. 180 tablet 3  . spironolactone (ALDACTONE) 25 MG tablet TAKE 1 TABLET BY MOUTH ONCE DAILY 30 tablet 5   No current facility-administered medications for this encounter.     Vitals:   01/22/18 1038  BP: 118/78  Pulse: 72  SpO2: 94%  Weight: (!) 148.3 kg (327 lb)   Wt Readings from Last 3 Encounters:  01/22/18 (!) 148.3 kg (327 lb)  01/21/18 (!) 145.2 kg (320 lb)  01/13/18 (!) 146.1 kg (322 lb)   PHYSICAL EXAM: General: Well appearing. No resp difficulty. HEENT: Normal Neck: Supple. JVP 5-6. Carotids 2+ bilat; no bruits. No thyromegaly or nodule noted. Cor: PMI nondisplaced. RRR, No M/G/R noted Lungs: CTAB, normal effort. Abdomen: Soft, non-tender, non-distended, no HSM. No bruits or masses. +BS  Extremities: No cyanosis, clubbing, or rash. R and LLE no edema.  Neuro: Alert & orientedx3, cranial nerves grossly intact. moves all 4 extremities w/o difficulty. Affect pleasant   EKG today with NSR 66 bpm, personally reviewed.   ASSESSMENT & PLAN:  1. Chronic systolic CHF: Had [facility] 0/9381 showing minimal CAD.  Nonischemic cardiomyopathy, possibly tachycardia-mediated in the setting of rapid atrial fibrillation.   Echo 04/2016 EF 15% but improved to 40-45% on TEE in 7/18. On 8/2 2019 EF had normalized to 55-60%.    - NYHA II symptoms - Volume status stable. - Continue lasix 40 mg daily  - Continue Entresto 97/103 bid.     - Continue spironolactone 25 mg daily.  - Continue Toprol XL 75 mg bid.  2. Atrial  fibrillation:  DCCV in 7/18. 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   - He is back in NSR today, with improved medication compliance.  - Decrease amiodarone back to 200 mg daily.  - Continue apixaban 5 mg bid.  Stressed the importance of compliance.  - Seen by Dr. Curt Bears by EP for ablation. Wants to optimize OSA first with sleep study in a few weeks.  - 3. HLD: Continue statin. No change.  4. OSA: Not using CPAP. Saw Dr Radford Pax and he is being set up for another sleep study. Need to address OSA prior to ablation.  5. Obesity - Body mass index is 41.98 kg/m.  - Encouraged exercise and diet  Remains in NSR with improved med compliance. Needs to lose weight. Recommended exercise and [place]. Labs today. Decrease amidoarone  Jaelyn Cloninger, [place]  01/22/2018  Greater than 50% of the 25 minute visit was spent in counseling/coordination of care regarding disease state education, salt/fluid restriction, sliding scale diuretics, and medication compliance.

## 2018-01-23 MED FILL — ATORVASTATIN CALCIUM 80 MG: 80 | 30 days supply | Qty: 30 | Fill #9

## 2018-01-27 ENCOUNTER — Encounter (HOSPITAL_COMMUNITY): Payer: Self-pay

## 2018-01-27 NOTE — Addendum Note (Signed)
Encounter addended by: Jorge Ny, LCSW on: 01/27/2018 7:23 AM  Actions taken: Visit Navigator Flowsheet section accepted

## 2018-01-28 ENCOUNTER — Other Ambulatory Visit (HOSPITAL_COMMUNITY): Payer: Self-pay

## 2018-01-28 NOTE — Progress Notes (Signed)
Paramedicine Encounter    Patient ID: Darius [name], male    DOB: 1955-03-11, 62 y.o.   MRN: [id number]   Patient Care Team: Scot Jun, FNP as PCP - General (Family Medicine) Constance Haw, MD as PCP - Electrophysiology (Cardiology) [name], MD as Consulting Physician (Cardiology) Jorge Ny, LCSW as Social Worker (Licensed Clinical Social Worker)  Patient Active Problem List   Diagnosis Date Noted  . Urine leukocytes 05/24/2017  . Atrial fibrillation with RVR ([place]) 04/17/2016  . Elevated troponin 04/17/2016  . CHF (congestive heart failure) ([place]) 04/17/2016  . Septic olecranon bursitis of right elbow 09/19/2015  . Lactic acid acidosis   . Dilated cardiomyopathy ([place]) 04/19/2014  . Type 2 diabetes mellitus ([place]) 04/09/2014  . Non compliance with medical treatment and diet 04/09/2014  . Chronic anticoagulation-Eliquis started 04/06/14 04/09/2014  . Abnormal chest CT-LUL nodule- needs f/u May 2016 04/09/2014  . Acute on chronic combined systolic and diastolic heart failure ([place]) 04/05/2014  . OSA - C-pap 03/22/2013  . Persistent atrial fibrillation ([place]) 03/27/2011  . Obesity, Class III, BMI 40-49.9 (morbid obesity) ([place]) 03/27/2011  . URI, acute 03/27/2011    Current Outpatient Medications:  .  amiodarone (PACERONE) 200 MG tablet, Take 1 tablet (200 mg total) by mouth daily., Disp: 30 tablet, Rfl: 2 .  apixaban (ELIQUIS) 5 MG TABS tablet, Take 1 tablet (5 mg total) by mouth 2 (two) times daily., Disp: 60 tablet, Rfl: 5 .  furosemide (LASIX) 40 MG tablet, TAKE 1 TABLET BY MOUTH ONCE DAILY, Disp: 100 tablet, Rfl: 2 .  ketoconazole (NIZORAL) 2 % shampoo, Apply 1 application topically 2 (two) times a week., Disp: 120 mL, Rfl: 0 .  metFORMIN (GLUCOPHAGE) 1000 MG tablet, TAKE 1 TABLET BY MOUTH TWICE A DAY WITH FOOD, Disp: 60 tablet, Rfl: 3 .  metoprolol succinate (TOPROL-XL) 50 MG 24 hr tablet, TAKE 1 & 1/2 TABLET BY MOUTH TWICE DAILY WITH OR IMMEDIATELY  FOLLOWING A MEAL, Disp: 90 tablet, Rfl: 5 .  sacubitril-valsartan (ENTRESTO) 97-103 MG, Take 1 tablet by mouth 2 (two) times daily., Disp: 180 tablet, Rfl: 3 .  spironolactone (ALDACTONE) 25 MG tablet, TAKE 1 TABLET BY MOUTH ONCE DAILY, Disp: 30 tablet, Rfl: 5 .  atorvastatin (LIPITOR) 80 MG tablet, TAKE 1 TABLET (80 MG TOTAL) BY MOUTH AT BEDTIME. (Patient not taking: Reported on 01/28/2018), Disp: 30 tablet, Rfl: 11 .  nystatin-triamcinolone ointment (MYCOLOG), Apply 1 application topically 2 (two) times daily., Disp: 60 g, Rfl: 1 No Known Allergies    Social History   Socioeconomic History  . Marital status: Single    Spouse name: Not on file  . Number of children: 0  . Years of education: 85  . Highest education level: Not on file  Occupational History  . Occupation: unemployeed  Social Needs  . Financial resource strain: Hard  . Food insecurity:    Worry: Sometimes true    Inability: Sometimes true  . Transportation needs:    Medical: No    Non-medical: No  Tobacco Use  . Smoking status: Never Smoker  . Smokeless tobacco: Never Used  Substance and Sexual Activity  . Alcohol use: Yes    Comment: some  . Drug use: No  . Sexual activity: Never  Lifestyle  . Physical activity:    Days per week: Not on file    Minutes per session: Not on file  . Stress: Not on file  Relationships  . Social connections:  Talks on phone: Not on file    Gets together: Not on file    Attends religious service: Not on file    Active member of club or organization: Not on file    Attends meetings of clubs or organizations: Not on file    Relationship status: Not on file  . Intimate partner violence:    Fear of current or ex partner: Not on file    Emotionally abused: Not on file    Physically abused: Not on file    Forced sexual activity: Not on file  Other Topics Concern  . Not on file  Social History Narrative   Admits that he eats poorly and eats a lot at work.      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    Physical Exam Cardiovascular:     Rate and Rhythm: Normal rate and regular rhythm.  Pulmonary:     Effort: Pulmonary effort is normal.     Breath sounds: Normal breath sounds.  Musculoskeletal:     Right lower leg: Edema present.     Left lower leg: Edema present.  Skin:    General: Skin is warm and dry.  Neurological:     Mental Status: He is alert and oriented to person, place, and time.  Psychiatric:        Mood and Affect: Mood normal.         Future Appointments  Date Time Provider [place]  02/14/2018  8:00 PM [facility] RO[facility] 3 [facility]  03/10/2018  2:00 PM [name], FNP [facility] None    BP 100/68 (BP Location: Left Arm, Patient Position: Sitting, Cuff Size: Large)   Pulse 82   Resp 16   Wt (!) 319 lb (144.7 kg)   SpO2 97%   BMI 40.96 kg/m   Weight yesterday- Did not weigh Last visit weight- 320 lb  Darius [name] was seen at the [facility] today and reported feeling well. He denied chest pain, SOB, headache, dizziness or orthopnea. He reported being compliant with his medications over the past week and his weight has been stable. His medications were verified and his pillbox was refilled.   Jacquiline Doe, EMT 01/28/18  ACTION: Home visit completed Next visit planned for 1 week

## 2018-02-04 ENCOUNTER — Other Ambulatory Visit (HOSPITAL_COMMUNITY): Payer: Self-pay

## 2018-02-04 MED FILL — ELIQUIS 5 MG TABLET: 5 | 30 days supply | Qty: 60 | Fill #2

## 2018-02-04 MED FILL — ENTRESTO 97 MG-103 MG TAB: 97-103 | 30 days supply | Qty: 60 | Fill #1

## 2018-02-04 NOTE — Progress Notes (Signed)
Paramedicine Encounter    Patient ID: Darius [name], male    DOB: September 03, 1955, 62 y.o.   MRN: [id number]   Patient Care Team: Scot Jun, FNP as PCP - General (Family Medicine) Constance Haw, MD as PCP - Electrophysiology (Cardiology) [name], MD as Consulting Physician (Cardiology) Jorge Ny, LCSW as Social Worker (Licensed Clinical Social Worker)  Patient Active Problem List   Diagnosis Date Noted  . Urine leukocytes 05/24/2017  . Atrial fibrillation with RVR ([place]) 04/17/2016  . Elevated troponin 04/17/2016  . CHF (congestive heart failure) ([place]) 04/17/2016  . Septic olecranon bursitis of right elbow 09/19/2015  . Lactic acid acidosis   . Dilated cardiomyopathy ([place]) 04/19/2014  . Type 2 diabetes mellitus ([place]) 04/09/2014  . Non compliance with medical treatment and diet 04/09/2014  . Chronic anticoagulation-Eliquis started 04/06/14 04/09/2014  . Abnormal chest CT-LUL nodule- needs f/u May 2016 04/09/2014  . Acute on chronic combined systolic and diastolic heart failure ([place]) 04/05/2014  . OSA - C-pap 03/22/2013  . Persistent atrial fibrillation ([place]) 03/27/2011  . Obesity, Class III, BMI 40-49.9 (morbid obesity) ([place]) 03/27/2011  . URI, acute 03/27/2011    Current Outpatient Medications:  .  amiodarone (PACERONE) 200 MG tablet, Take 1 tablet (200 mg total) by mouth daily., Disp: 30 tablet, Rfl: 2 .  apixaban (ELIQUIS) 5 MG TABS tablet, Take 1 tablet (5 mg total) by mouth 2 (two) times daily., Disp: 60 tablet, Rfl: 5 .  atorvastatin (LIPITOR) 80 MG tablet, TAKE 1 TABLET (80 MG TOTAL) BY MOUTH AT BEDTIME., Disp: 30 tablet, Rfl: 11 .  furosemide (LASIX) 40 MG tablet, TAKE 1 TABLET BY MOUTH ONCE DAILY, Disp: 100 tablet, Rfl: 2 .  ketoconazole (NIZORAL) 2 % shampoo, Apply 1 application topically 2 (two) times a week., Disp: 120 mL, Rfl: 0 .  metFORMIN (GLUCOPHAGE) 1000 MG tablet, TAKE 1 TABLET BY MOUTH TWICE A DAY WITH FOOD, Disp: 60 tablet, Rfl: 3 .   metoprolol succinate (TOPROL-XL) 50 MG 24 hr tablet, TAKE 1 & 1/2 TABLET BY MOUTH TWICE DAILY WITH OR IMMEDIATELY FOLLOWING A MEAL, Disp: 90 tablet, Rfl: 5 .  nystatin-triamcinolone ointment (MYCOLOG), Apply 1 application topically 2 (two) times daily., Disp: 60 g, Rfl: 1 .  sacubitril-valsartan (ENTRESTO) 97-103 MG, Take 1 tablet by mouth 2 (two) times daily., Disp: 180 tablet, Rfl: 3 .  spironolactone (ALDACTONE) 25 MG tablet, TAKE 1 TABLET BY MOUTH ONCE DAILY, Disp: 30 tablet, Rfl: 5 No Known Allergies    Social History   Socioeconomic History  . Marital status: Single    Spouse name: Not on file  . Number of children: 0  . Years of education: 47  . Highest education level: Not on file  Occupational History  . Occupation: unemployeed  Social Needs  . Financial resource strain: Hard  . Food insecurity:    Worry: Sometimes true    Inability: Sometimes true  . Transportation needs:    Medical: No    Non-medical: No  Tobacco Use  . Smoking status: Never Smoker  . Smokeless tobacco: Never Used  Substance and Sexual Activity  . Alcohol use: Yes    Comment: some  . Drug use: No  . Sexual activity: Never  Lifestyle  . Physical activity:    Days per week: Not on file    Minutes per session: Not on file  . Stress: Not on file  Relationships  . Social connections:    Talks on phone: Not on file  Gets together: Not on file    Attends religious service: Not on file    Active member of club or organization: Not on file    Attends meetings of clubs or organizations: Not on file    Relationship status: Not on file  . Intimate partner violence:    Fear of current or ex partner: Not on file    Emotionally abused: Not on file    Physically abused: Not on file    Forced sexual activity: Not on file  Other Topics Concern  . Not on file  Social History Narrative   Admits that he eats poorly and eats a lot at work.      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    Physical Exam Cardiovascular:     Rate and Rhythm: Normal rate and regular rhythm.  Pulmonary:     Effort: Pulmonary effort is normal.     Breath sounds: Normal breath sounds.  Musculoskeletal: Normal range of motion.     Right lower leg: Edema present.     Left lower leg: Edema present.  Skin:    General: Skin is warm and dry.     Capillary Refill: Capillary refill takes less than 2 seconds.  Neurological:     Mental Status: He is alert.  Psychiatric:        Mood and Affect: Mood normal.         Future Appointments  Date Time Provider [place]  02/14/2018  8:00 PM [facility] ROOM 3 [facility]  03/10/2018  2:00 PM [name], FNP [facility] None    BP 110/72 (BP Location: Left Arm, Patient Position: Sitting, Cuff Size: Large)   Pulse 76   Resp 16   Wt (!) 324 lb (147 kg)   SpO2 96%   BMI 41.60 kg/m   Weight yesterday- 322 lb Last visit weight- 319 lb  [name] today and reported feeling well. He denied chest pain, SOB, headache, dizziness or orthopnea. He stated he had been compliant with his medications over the past week and his weight has been mostly stable. He stated he was up some over the past week but it was a result of holiday eating. His medications were verified and his pillbox was refilled.   Jacquiline Doe, EMT 02/04/18  ACTION: Home visit completed Next visit planned for 1 week

## 2018-02-11 ENCOUNTER — Other Ambulatory Visit (HOSPITAL_COMMUNITY): Payer: Self-pay

## 2018-02-11 MED FILL — SPIRONOLACTONE 25 MG TABLET: 25 | 30 days supply | Qty: 30 | Fill #1

## 2018-02-11 NOTE — Progress Notes (Signed)
Paramedicine Encounter    Patient ID: Darius [name], male    DOB: 01-Oct-1955, 63 y.o.   MRN: [id number]   Patient Care Team: Scot Jun, FNP as PCP - General (Family Medicine) Constance Haw, MD as PCP - Electrophysiology (Cardiology) [name], MD as Consulting Physician (Cardiology) Jorge Ny, LCSW as Social Worker (Licensed Clinical Social Worker)  Patient Active Problem List   Diagnosis Date Noted  . Urine leukocytes 05/24/2017  . Atrial fibrillation with RVR ([place]) 04/17/2016  . Elevated troponin 04/17/2016  . CHF (congestive heart failure) ([place]) 04/17/2016  . Septic olecranon bursitis of right elbow 09/19/2015  . Lactic acid acidosis   . Dilated cardiomyopathy ([place]) 04/19/2014  . Type 2 diabetes mellitus ([place]) 04/09/2014  . Non compliance with medical treatment and diet 04/09/2014  . Chronic anticoagulation-Eliquis started 04/06/14 04/09/2014  . Abnormal chest CT-LUL nodule- needs f/u May 2016 04/09/2014  . Acute on chronic combined systolic and diastolic heart failure ([place]) 04/05/2014  . OSA - C-pap 03/22/2013  . Persistent atrial fibrillation ([place]) 03/27/2011  . Obesity, Class III, BMI 40-49.9 (morbid obesity) ([place]) 03/27/2011  . URI, acute 03/27/2011    Current Outpatient Medications:  .  amiodarone (PACERONE) 200 MG tablet, Take 1 tablet (200 mg total) by mouth daily., Disp: 30 tablet, Rfl: 2 .  apixaban (ELIQUIS) 5 MG TABS tablet, Take 1 tablet (5 mg total) by mouth 2 (two) times daily., Disp: 60 tablet, Rfl: 5 .  atorvastatin (LIPITOR) 80 MG tablet, TAKE 1 TABLET (80 MG TOTAL) BY MOUTH AT BEDTIME., Disp: 30 tablet, Rfl: 11 .  furosemide (LASIX) 40 MG tablet, TAKE 1 TABLET BY MOUTH ONCE DAILY, Disp: 100 tablet, Rfl: 2 .  metFORMIN (GLUCOPHAGE) 1000 MG tablet, TAKE 1 TABLET BY MOUTH TWICE A DAY WITH FOOD, Disp: 60 tablet, Rfl: 3 .  metoprolol succinate (TOPROL-XL) 50 MG 24 hr tablet, TAKE 1 & 1/2 TABLET BY MOUTH TWICE DAILY WITH OR IMMEDIATELY  FOLLOWING A MEAL, Disp: 90 tablet, Rfl: 5 .  sacubitril-valsartan (ENTRESTO) 97-103 MG, Take 1 tablet by mouth 2 (two) times daily., Disp: 180 tablet, Rfl: 3 .  spironolactone (ALDACTONE) 25 MG tablet, TAKE 1 TABLET BY MOUTH ONCE DAILY, Disp: 30 tablet, Rfl: 5 .  ketoconazole (NIZORAL) 2 % shampoo, Apply 1 application topically 2 (two) times a week. (Patient not taking: Reported on 02/11/2018), Disp: 120 mL, Rfl: 0 .  nystatin-triamcinolone ointment (MYCOLOG), Apply 1 application topically 2 (two) times daily. (Patient not taking: Reported on 02/11/2018), Disp: 60 g, Rfl: 1 No Known Allergies    Social History   Socioeconomic History  . Marital status: Single    Spouse name: Not on file  . Number of children: 0  . Years of education: 91  . Highest education level: Not on file  Occupational History  . Occupation: unemployeed  Social Needs  . Financial resource strain: Hard  . Food insecurity:    Worry: Sometimes true    Inability: Sometimes true  . Transportation needs:    Medical: No    Non-medical: No  Tobacco Use  . Smoking status: Never Smoker  . Smokeless tobacco: Never Used  Substance and Sexual Activity  . Alcohol use: Yes    Comment: some  . Drug use: No  . Sexual activity: Never  Lifestyle  . Physical activity:    Days per week: Not on file    Minutes per session: Not on file  . Stress: Not on file  Relationships  .  Social connections:    Talks on phone: Not on file    Gets together: Not on file    Attends religious service: Not on file    Active member of club or organization: Not on file    Attends meetings of clubs or organizations: Not on file    Relationship status: Not on file  . Intimate partner violence:    Fear of current or ex partner: Not on file    Emotionally abused: Not on file    Physically abused: Not on file    Forced sexual activity: Not on file  Other Topics Concern  . Not on file  Social History Narrative   Admits that he eats poorly and  eats a lot at work.      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    Physical Exam Cardiovascular:     Rate and Rhythm: Normal rate and regular rhythm.  Pulmonary:     Effort: Pulmonary effort is normal.     Breath sounds: Normal breath sounds.  Abdominal:     Palpations: Abdomen is soft.  Musculoskeletal: Normal range of motion.     Right lower leg: No edema.     Left lower leg: No edema.  Skin:    General: Skin is warm and dry.     Capillary Refill: Capillary refill takes less than 2 seconds.  Neurological:     Mental Status: He is alert and oriented to person, place, and time.         Future Appointments  Date Time Provider [place]  02/14/2018  8:00 PM [facility] ROOM 3 [facility]  03/10/2018  2:00 PM [name], FNP [facility] None    BP 114/78 (BP Location: Left Arm, Patient Position: Sitting, Cuff Size: Large)   Pulse 70   Resp 18   Wt (!) 326 lb (147.9 kg)   SpO2 96%   BMI 41.86 kg/m   Weight yesterday- 326 lb Last visit weight- 324 lb  Mr Massing was seen at the [facility] today and reported feeling generally well. He denied chest pain, SOB, headache, dizziness or orthopnea. He stated he has been compliant with his medications over the past week and his weight has been stable. His medications were verified. Today we worked on him filling his pillbox and ordering his refill. He was able to do both with a moderate about of oversight, but we will continue to work on it.   Jacquiline Doe, EMT 02/11/18  ACTION: Next visit planned for 1 week

## 2018-02-14 ENCOUNTER — Ambulatory Visit (HOSPITAL_BASED_OUTPATIENT_CLINIC_OR_DEPARTMENT_OTHER): Payer: Medicaid Other | Attending: Cardiology | Admitting: Cardiology

## 2018-02-14 VITALS — Ht 74.0 in | Wt 320.0 lb

## 2018-02-14 DIAGNOSIS — G4733 Obstructive sleep apnea (adult) (pediatric): Secondary | ICD-10-CM | POA: Diagnosis not present

## 2018-02-14 DIAGNOSIS — R0902 Hypoxemia: Secondary | ICD-10-CM | POA: Diagnosis not present

## 2018-02-16 NOTE — Procedures (Signed)
   Patient Name: Melbert, Botelho Date: 02/14/2018 Gender: Male D.O.B: [birth year] Age (years): 89 Referring Provider: Fransico Him MD, ABSM Height (inches): 74 Interpreting Physician: Fransico Him MD, ABSM Weight (lbs): 320 RPSGT: [name] BMI: 41 MRN: [id number] Neck Size: 20.00  CLINICAL INFORMATION Sleep Study Type: NPSG  Indication for sleep study: OSA  Epworth Sleepiness Score: 6  SLEEP STUDY TECHNIQUE As per the AASM Manual for the Scoring of Sleep and Associated Events v2.3 (April 2016) with a hypopnea requiring 4% desaturations.  The channels recorded and monitored were frontal, central and occipital EEG, electrooculogram (EOG), submentalis EMG (chin), nasal and oral airflow, thoracic and abdominal wall motion, anterior tibialis EMG, snore microphone, electrocardiogram, and pulse oximetry.  MEDICATIONS Medications self-administered by patient taken the night of the study : N/A  SLEEP ARCHITECTURE The study was initiated at 11:08:04 PM and ended at 5:17:53 AM.  Sleep onset time was 45.4 minutes and the sleep efficiency was 55.7%. The total sleep time was 205.9 minutes.  Stage REM latency was 288.0 minutes.  The patient spent 37.59% of the night in stage N1 sleep, 57.07% in stage N2 sleep, 0.00% in stage N3 and 5.3% in REM.  Alpha intrusion was absent.  Supine sleep was 31.08%.  RESPIRATORY PARAMETERS The overall apnea/hypopnea index (AHI) was 46.3 per hour. There were 118 total apneas, including 116 obstructive, 2 central and 0 mixed apneas. There were 41 hypopneas and 107 RERAs.  The AHI during Stage REM sleep was 27.3 per hour.  AHI while supine was 96.6 per hour.  The mean oxygen saturation was 93.10%. The minimum SpO2 during sleep was 72.00%.  loud snoring was noted during this study.  CARDIAC DATA The 2 lead EKG demonstrated sinus rhythm. The mean heart rate was 56.54 beats per minute. Other EKG findings include: None.  LEG MOVEMENT  DATA The total PLMS were 4 with a resulting PLMS index of 1.17. Associated arousal with leg movement index was 0.0 .  IMPRESSIONS - Severe obstructive sleep apnea occurred during this study (AHI = 46.3/h). - No significant central sleep apnea occurred during this study (CAI = 0.6/h). - Moderate oxygen desaturation was noted during this study (Min O2 = 72.00%). - The patient snored with loud snoring volume. - No cardiac abnormalities were noted during this study. - Clinically significant periodic limb movements did not occur during sleep. No significant associated arousals.  DIAGNOSIS - Obstructive Sleep Apnea (327.23 [G47.33 ICD-10]) - Nocturnal Hypoxemia (327.26 [G47.36 ICD-10])  RECOMMENDATIONS - Therapeutic CPAP titration to determine optimal pressure required to alleviate sleep disordered breathing. - Positional therapy avoiding supine position during sleep. - Avoid alcohol, sedatives and other CNS depressants that may worsen sleep apnea and disrupt normal sleep architecture. - Sleep hygiene should be reviewed to assess factors that may improve sleep quality. - Weight management and regular exercise should be initiated or continued if appropriate.  [Electronically signed] 02/16/2018 08:16 PM  Fransico Him MD, ABSM Diplomate, American Board of Sleep Medicine

## 2018-02-18 ENCOUNTER — Other Ambulatory Visit (HOSPITAL_COMMUNITY): Payer: Self-pay

## 2018-02-18 NOTE — Progress Notes (Signed)
Paramedicine Encounter    Patient ID: Darius [name], male    DOB: 1955-07-25, 63 y.o.   MRN: [id number]   Patient Care Team: Scot Jun, FNP as PCP - General (Family Medicine) Constance Haw, MD as PCP - Electrophysiology (Cardiology) [name], MD as Consulting Physician (Cardiology) Jorge Ny, LCSW as Social Worker (Licensed Clinical Social Worker)  Patient Active Problem List   Diagnosis Date Noted  . Urine leukocytes 05/24/2017  . Atrial fibrillation with RVR ([place]) 04/17/2016  . Elevated troponin 04/17/2016  . CHF (congestive heart failure) ([place]) 04/17/2016  . Septic olecranon bursitis of right elbow 09/19/2015  . Lactic acid acidosis   . Dilated cardiomyopathy ([place]) 04/19/2014  . Type 2 diabetes mellitus ([place]) 04/09/2014  . Non compliance with medical treatment and diet 04/09/2014  . Chronic anticoagulation-Eliquis started 04/06/14 04/09/2014  . Abnormal chest CT-LUL nodule- needs f/u May 2016 04/09/2014  . Acute on chronic combined systolic and diastolic heart failure ([place]) 04/05/2014  . OSA - C-pap 03/22/2013  . Persistent atrial fibrillation ([place]) 03/27/2011  . Obesity, Class III, BMI 40-49.9 (morbid obesity) ([place]) 03/27/2011  . URI, acute 03/27/2011    Current Outpatient Medications:  .  amiodarone (PACERONE) 200 MG tablet, Take 1 tablet (200 mg total) by mouth daily., Disp: 30 tablet, Rfl: 2 .  apixaban (ELIQUIS) 5 MG TABS tablet, Take 1 tablet (5 mg total) by mouth 2 (two) times daily., Disp: 60 tablet, Rfl: 5 .  atorvastatin (LIPITOR) 80 MG tablet, TAKE 1 TABLET (80 MG TOTAL) BY MOUTH AT BEDTIME., Disp: 30 tablet, Rfl: 11 .  furosemide (LASIX) 40 MG tablet, TAKE 1 TABLET BY MOUTH ONCE DAILY, Disp: 100 tablet, Rfl: 2 .  metFORMIN (GLUCOPHAGE) 1000 MG tablet, TAKE 1 TABLET BY MOUTH TWICE A DAY WITH FOOD, Disp: 60 tablet, Rfl: 3 .  metoprolol succinate (TOPROL-XL) 50 MG 24 hr tablet, TAKE 1 & 1/2 TABLET BY MOUTH TWICE DAILY WITH OR IMMEDIATELY  FOLLOWING A MEAL, Disp: 90 tablet, Rfl: 5 .  sacubitril-valsartan (ENTRESTO) 97-103 MG, Take 1 tablet by mouth 2 (two) times daily., Disp: 180 tablet, Rfl: 3 .  spironolactone (ALDACTONE) 25 MG tablet, TAKE 1 TABLET BY MOUTH ONCE DAILY, Disp: 30 tablet, Rfl: 5 .  ketoconazole (NIZORAL) 2 % shampoo, Apply 1 application topically 2 (two) times a week. (Patient not taking: Reported on 02/11/2018), Disp: 120 mL, Rfl: 0 .  nystatin-triamcinolone ointment (MYCOLOG), Apply 1 application topically 2 (two) times daily. (Patient not taking: Reported on 02/11/2018), Disp: 60 g, Rfl: 1 No Known Allergies    Social History   Socioeconomic History  . Marital status: Single    Spouse name: Not on file  . Number of children: 0  . Years of education: 4  . Highest education level: Not on file  Occupational History  . Occupation: unemployeed  Social Needs  . Financial resource strain: Hard  . Food insecurity:    Worry: Sometimes true    Inability: Sometimes true  . Transportation needs:    Medical: No    Non-medical: No  Tobacco Use  . Smoking status: Never Smoker  . Smokeless tobacco: Never Used  Substance and Sexual Activity  . Alcohol use: Yes    Comment: some  . Drug use: No  . Sexual activity: Never  Lifestyle  . Physical activity:    Days per week: Not on file    Minutes per session: Not on file  . Stress: Not on file  Relationships  .  Social connections:    Talks on phone: Not on file    Gets together: Not on file    Attends religious service: Not on file    Active member of club or organization: Not on file    Attends meetings of clubs or organizations: Not on file    Relationship status: Not on file  . Intimate partner violence:    Fear of current or ex partner: Not on file    Emotionally abused: Not on file    Physically abused: Not on file    Forced sexual activity: Not on file  Other Topics Concern  . Not on file  Social History Narrative   Admits that he eats poorly and  eats a lot at work.      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    Physical Exam Cardiovascular:     Rate and Rhythm: Normal rate and regular rhythm.  Pulmonary:     Effort: Pulmonary effort is normal.  Musculoskeletal: Normal range of motion.     Right lower leg: No edema.     Left lower leg: No edema.  Skin:    General: Skin is warm.     Capillary Refill: Capillary refill takes less than 2 seconds.  Neurological:     Mental Status: He is alert.  Psychiatric:        Mood and Affect: Mood normal.         Future Appointments  Date Time Provider [place]  03/10/2018  2:00 PM [name], FNP [facility] None    BP 110/76 (BP Location: Right Arm, Patient Position: Sitting, Cuff Size: Large)   Pulse 76   Resp 16   Wt (!) 327 lb (148.3 kg)   SpO2 96%   BMI 41.98 kg/m   Weight yesterday- 327 lb Last visit weight- 326 lb  Darius [name] and reported feeling well. He denied chest pain, SOB, headache, dizziness or orthopnea. He stated he had been compliant with his medications and his weight has been stable, albeit elevated from his goal. He was aided in filling his pillbox today and we discussed what each medication is treating. He continues to have difficulty with this but is making some progress. He has three medications which needed to be ordered from the pharmacy but he stated he would be able to handle this on his own. I will follow up next week to ensure it has been done.   Jacquiline Doe, EMT 02/18/18  ACTION: Home visit completed Next visit planned for 1 week

## 2018-02-25 ENCOUNTER — Other Ambulatory Visit (HOSPITAL_COMMUNITY): Payer: Self-pay | Admitting: Cardiology

## 2018-02-25 ENCOUNTER — Other Ambulatory Visit (HOSPITAL_COMMUNITY): Payer: Self-pay

## 2018-02-25 MED FILL — METOPROLOL SUCCINATE ER 50: 50 | 30 days supply | Qty: 90 | Fill #1

## 2018-02-25 MED FILL — ATORVASTATIN CALCIUM 80 MG: 80 | 30 days supply | Qty: 30 | Fill #0

## 2018-02-25 MED FILL — metFORMIN HCL 1000 MG TABS: 1000 | 30 days supply | Qty: 60 | Fill #0

## 2018-02-25 NOTE — Progress Notes (Signed)
Paramedicine Encounter    Patient ID: Darius [name], male    DOB: [birth year], 63 y.o.   MRN: [id number]   Patient Care Team: Scot Jun, FNP as PCP - General (Family Medicine) Constance Haw, MD as PCP - Electrophysiology (Cardiology) [name], MD as Consulting Physician (Cardiology) Jorge Ny, LCSW as Social Worker (Licensed Clinical Social Worker)  Patient Active Problem List   Diagnosis Date Noted  . Urine leukocytes 05/24/2017  . Atrial fibrillation with RVR ([place]) 04/17/2016  . Elevated troponin 04/17/2016  . CHF (congestive heart failure) ([place]) 04/17/2016  . Septic olecranon bursitis of right elbow 09/19/2015  . Lactic acid acidosis   . Dilated cardiomyopathy ([place]) 04/19/2014  . Type 2 diabetes mellitus ([place]) 04/09/2014  . Non compliance with medical treatment and diet 04/09/2014  . Chronic anticoagulation-Eliquis started 04/06/14 04/09/2014  . Abnormal chest CT-LUL nodule- needs f/u May 2016 04/09/2014  . Acute on chronic combined systolic and diastolic heart failure ([place]) 04/05/2014  . OSA - C-pap 03/22/2013  . Persistent atrial fibrillation ([place]) 03/27/2011  . Obesity, Class III, BMI 40-49.9 (morbid obesity) ([place]) 03/27/2011  . URI, acute 03/27/2011    Current Outpatient Medications:  .  amiodarone (PACERONE) 200 MG tablet, Take 1 tablet (200 mg total) by mouth daily., Disp: 30 tablet, Rfl: 2 .  apixaban (ELIQUIS) 5 MG TABS tablet, Take 1 tablet (5 mg total) by mouth 2 (two) times daily., Disp: 60 tablet, Rfl: 5 .  atorvastatin (LIPITOR) 80 MG tablet, TAKE 1 TABLET (80 MG TOTAL) BY MOUTH AT BEDTIME., Disp: 30 tablet, Rfl: 11 .  furosemide (LASIX) 40 MG tablet, TAKE 1 TABLET BY MOUTH ONCE DAILY, Disp: 100 tablet, Rfl: 2 .  metFORMIN (GLUCOPHAGE) 1000 MG tablet, TAKE 1 TABLET BY MOUTH TWICE A DAY WITH FOOD, Disp: 60 tablet, Rfl: 3 .  metoprolol succinate (TOPROL-XL) 50 MG 24 hr tablet, TAKE 1 & 1/2 TABLET BY MOUTH TWICE DAILY WITH OR IMMEDIATELY  FOLLOWING A MEAL, Disp: 90 tablet, Rfl: 5 .  sacubitril-valsartan (ENTRESTO) 97-103 MG, Take 1 tablet by mouth 2 (two) times daily., Disp: 180 tablet, Rfl: 3 .  spironolactone (ALDACTONE) 25 MG tablet, TAKE 1 TABLET BY MOUTH ONCE DAILY, Disp: 30 tablet, Rfl: 5 .  ketoconazole (NIZORAL) 2 % shampoo, Apply 1 application topically 2 (two) times a week. (Patient not taking: Reported on 02/11/2018), Disp: 120 mL, Rfl: 0 .  nystatin-triamcinolone ointment (MYCOLOG), Apply 1 application topically 2 (two) times daily. (Patient not taking: Reported on 02/11/2018), Disp: 60 g, Rfl: 1 No Known Allergies    Social History   Socioeconomic History  . Marital status: Single    Spouse name: Not on file  . Number of children: 0  . Years of education: 35  . Highest education level: Not on file  Occupational History  . Occupation: unemployeed  Social Needs  . Financial resource strain: Hard  . Food insecurity:    Worry: Sometimes true    Inability: Sometimes true  . Transportation needs:    Medical: No    Non-medical: No  Tobacco Use  . Smoking status: Never Smoker  . Smokeless tobacco: Never Used  Substance and Sexual Activity  . Alcohol use: Yes    Comment: some  . Drug use: No  . Sexual activity: Never  Lifestyle  . Physical activity:    Days per week: Not on file    Minutes per session: Not on file  . Stress: Not on file  Relationships  .  Social connections:    Talks on phone: Not on file    Gets together: Not on file    Attends religious service: Not on file    Active member of club or organization: Not on file    Attends meetings of clubs or organizations: Not on file    Relationship status: Not on file  . Intimate partner violence:    Fear of current or ex partner: Not on file    Emotionally abused: Not on file    Physically abused: Not on file    Forced sexual activity: Not on file  Other Topics Concern  . Not on file  Social History Narrative   Admits that he eats poorly and  eats a lot at work.      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    Physical Exam Cardiovascular:     Rate and Rhythm: Normal rate and regular rhythm.  Pulmonary:     Effort: Pulmonary effort is normal.     Breath sounds: Normal breath sounds.  Musculoskeletal: Normal range of motion.     Right lower leg: No edema.     Left lower leg: No edema.  Skin:    General: Skin is warm and dry.     Capillary Refill: Capillary refill takes less than 2 seconds.  Neurological:     Mental Status: He is alert.  Psychiatric:        Mood and Affect: Mood normal.         Future Appointments  Date Time Provider [place]  03/10/2018  2:00 PM [name], FNP [facility] None    BP 112/80 (BP Location: Left Arm, Patient Position: Sitting, Cuff Size: Large)   Pulse 97   Resp 16   Wt (!) 325 lb (147.4 kg)   SpO2 95%   BMI 41.73 kg/m   Weight yesterday- 327 lb Last visit weight-   Darius [name] was seen today [facility] and reported feeling well. He denied chest pain, SOB, headache, dizziness or orthopnea. He stated he has been compliant with his medications over the past week and his weight has been relatively stable. His medications were verified and he was supervised while filling his pillbox. He was told to order metformin, metoprolol and atorvastatin for next week and he was agreeable and did so before we left. I will follow up next week.   Jacquiline Doe, EMT 02/25/18  ACTION: Home visit completed Next visit planned for 1 week

## 2018-03-01 ENCOUNTER — Telehealth: Payer: Self-pay | Admitting: *Deleted

## 2018-03-01 DIAGNOSIS — G4733 Obstructive sleep apnea (adult) (pediatric): Secondary | ICD-10-CM

## 2018-03-01 NOTE — Telephone Encounter (Signed)
-----   Message from Sueanne Margarita, MD sent at 02/16/2018  8:19 PM EST ----- Please let patient know that they have sleep apnea and recommend CPAP titration. Please set up titration in the sleep lab.

## 2018-03-01 NOTE — Telephone Encounter (Signed)
Informed patient of sleep study results and patient understanding was verbalized. Patient understands his sleep study showed   they have sleep apnea and recommend CPAP titration. Please set up titration in the sleep lab.    Pt is aware and agreeable to results.

## 2018-03-04 ENCOUNTER — Other Ambulatory Visit (HOSPITAL_COMMUNITY): Payer: Self-pay

## 2018-03-04 MED FILL — ELIQUIS 5 MG TABLET: 5 | 30 days supply | Qty: 60 | Fill #3

## 2018-03-04 MED FILL — AMIODARONE HCL 200 MG TAB: 200 | 30 days supply | Qty: 30 | Fill #1

## 2018-03-04 MED FILL — ENTRESTO 97 MG-103 MG TAB: 97-103 | 30 days supply | Qty: 60 | Fill #2

## 2018-03-04 NOTE — Progress Notes (Signed)
Paramedicine Encounter    Patient ID: Darius [name], male    DOB: [birth year], 63 y.o.   MRN: [id number]   Patient Care Team: Scot Jun, FNP as PCP - General (Family Medicine) Constance Haw, MD as PCP - Electrophysiology (Cardiology) [name], MD as Consulting Physician (Cardiology) Jorge Ny, LCSW as Social Worker (Licensed Clinical Social Worker)  Patient Active Problem List   Diagnosis Date Noted  . Urine leukocytes 05/24/2017  . Atrial fibrillation with RVR ([place]) 04/17/2016  . Elevated troponin 04/17/2016  . CHF (congestive heart failure) ([place]) 04/17/2016  . Septic olecranon bursitis of right elbow 09/19/2015  . Lactic acid acidosis   . Dilated cardiomyopathy ([place]) 04/19/2014  . Type 2 diabetes mellitus ([place]) 04/09/2014  . Non compliance with medical treatment and diet 04/09/2014  . Chronic anticoagulation-Eliquis started 04/06/14 04/09/2014  . Abnormal chest CT-LUL nodule- needs f/u May 2016 04/09/2014  . Acute on chronic combined systolic and diastolic heart failure ([place]) 04/05/2014  . OSA - C-pap 03/22/2013  . Persistent atrial fibrillation ([place]) 03/27/2011  . Obesity, Class III, BMI 40-49.9 (morbid obesity) ([place]) 03/27/2011  . URI, acute 03/27/2011    Current Outpatient Medications:  .  amiodarone (PACERONE) 200 MG tablet, Take 1 tablet (200 mg total) by mouth daily., Disp: 30 tablet, Rfl: 2 .  apixaban (ELIQUIS) 5 MG TABS tablet, Take 1 tablet (5 mg total) by mouth 2 (two) times daily., Disp: 60 tablet, Rfl: 5 .  atorvastatin (LIPITOR) 80 MG tablet, TAKE 1 TABLET (80 MG TOTAL) BY MOUTH AT BEDTIME., Disp: 30 tablet, Rfl: 11 .  furosemide (LASIX) 40 MG tablet, TAKE 1 TABLET BY MOUTH ONCE DAILY, Disp: 100 tablet, Rfl: 2 .  metFORMIN (GLUCOPHAGE) 1000 MG tablet, TAKE 1 TABLET BY MOUTH TWICE A DAY WITH FOOD, Disp: 60 tablet, Rfl: 3 .  metoprolol succinate (TOPROL-XL) 50 MG 24 hr tablet, TAKE 1 & 1/2 TABLET BY MOUTH TWICE DAILY WITH OR IMMEDIATELY  FOLLOWING A MEAL, Disp: 90 tablet, Rfl: 5 .  sacubitril-valsartan (ENTRESTO) 97-103 MG, Take 1 tablet by mouth 2 (two) times daily., Disp: 180 tablet, Rfl: 3 .  spironolactone (ALDACTONE) 25 MG tablet, TAKE 1 TABLET BY MOUTH ONCE DAILY, Disp: 30 tablet, Rfl: 5 .  ketoconazole (NIZORAL) 2 % shampoo, Apply 1 application topically 2 (two) times a week. (Patient not taking: Reported on 02/11/2018), Disp: 120 mL, Rfl: 0 .  nystatin-triamcinolone ointment (MYCOLOG), Apply 1 application topically 2 (two) times daily. (Patient not taking: Reported on 02/11/2018), Disp: 60 g, Rfl: 1 No Known Allergies    Social History   Socioeconomic History  . Marital status: Single    Spouse name: Not on file  . Number of children: 0  . Years of education: 60  . Highest education level: Not on file  Occupational History  . Occupation: unemployeed  Social Needs  . Financial resource strain: Hard  . Food insecurity:    Worry: Sometimes true    Inability: Sometimes true  . Transportation needs:    Medical: No    Non-medical: No  Tobacco Use  . Smoking status: Never Smoker  . Smokeless tobacco: Never Used  Substance and Sexual Activity  . Alcohol use: Yes    Comment: some  . Drug use: No  . Sexual activity: Never  Lifestyle  . Physical activity:    Days per week: Not on file    Minutes per session: Not on file  . Stress: Not on file  Relationships  .  Social connections:    Talks on phone: Not on file    Gets together: Not on file    Attends religious service: Not on file    Active member of club or organization: Not on file    Attends meetings of clubs or organizations: Not on file    Relationship status: Not on file  . Intimate partner violence:    Fear of current or ex partner: Not on file    Emotionally abused: Not on file    Physically abused: Not on file    Forced sexual activity: Not on file  Other Topics Concern  . Not on file  Social History Narrative   Admits that he eats poorly and  eats a lot at work.      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    Physical Exam Cardiovascular:     Rate and Rhythm: Normal rate and regular rhythm.     Pulses: Normal pulses.  Pulmonary:     Effort: Pulmonary effort is normal.     Breath sounds: Normal breath sounds.  Musculoskeletal: Normal range of motion.     Right lower leg: No edema.     Left lower leg: No edema.  Skin:    General: Skin is warm and dry.     Capillary Refill: Capillary refill takes less than 2 seconds.  Neurological:     Mental Status: He is alert.  Psychiatric:        Mood and Affect: Mood normal.         Future Appointments  Date Time Provider [place]  03/10/2018  2:00 PM [name], [place] None  03/16/2018  9:30 AM Camnitz, Ocie Doyne, MD [facility]    BP 122/82 (BP Location: Left Arm, Patient Position: Sitting, Cuff Size: Large)   Pulse 72   Resp 18   Wt (!) 329 lb (149.2 kg)   SpO2 95%   BMI 42.24 kg/m   Weight yesterday- 330 lb Last visit weight- 325 lb  Darius [name] was seen in the [facility] today and reported feeling well. He denied chest apin, SOB, headache, dizziness or orthopnea. He stated he has been compliant with his medications over the past week and his weight has been trending up but within the limitations of a weeks increase. His medications were verified and he was supervised while filling his pillbox. After that was finished, he was able to call in the necessary refills of medications without assistance. I am hopeful that he is nearing independence with his medications. He has not changed his dietary habits since we began working together but he now has insurance coverage and is able to get all his medications. It is my opinion that this is the highest level of success that Darius [name] will achieve via the paramedicine program. I will speak to the clinic about discharging him within the next month.   Darius [name], EMT 03/04/18  ACTION: Home visit completed Next visit planned for 1 week

## 2018-03-05 ENCOUNTER — Telehealth: Payer: Self-pay | Admitting: *Deleted

## 2018-03-05 NOTE — Telephone Encounter (Signed)
-----   Message from Freada Bergeron, [place] sent at 03/01/2018  5:56 PM EST ----- Regarding: precert  recommend CPAP titration.

## 2018-03-05 NOTE — Telephone Encounter (Signed)
Staff message sent to [facility] per chart patient has Medicaid. No PA is required. Ok to schedule CPAP titration.

## 2018-03-10 ENCOUNTER — Encounter: Payer: Self-pay | Admitting: Family Medicine

## 2018-03-10 ENCOUNTER — Telehealth (HOSPITAL_COMMUNITY): Payer: Self-pay

## 2018-03-10 ENCOUNTER — Ambulatory Visit (INDEPENDENT_AMBULATORY_CARE_PROVIDER_SITE_OTHER): Payer: Medicaid Other | Admitting: Family Medicine

## 2018-03-10 VITALS — BP 106/71 | HR 73 | Temp 98.2°F | Resp 18 | Ht 74.0 in | Wt 327.0 lb

## 2018-03-10 DIAGNOSIS — E1100 Type 2 diabetes mellitus with hyperosmolarity without nonketotic hyperglycemic-hyperosmolar coma (NKHHC): Secondary | ICD-10-CM

## 2018-03-10 LAB — POCT URINALYSIS DIPSTICK
Bilirubin, UA: NEGATIVE
Glucose, UA: POSITIVE — AB
Nitrite, UA: NEGATIVE
Protein, UA: POSITIVE — AB
Spec Grav, UA: 1.025 (ref 1.010–1.025)
Urobilinogen, UA: 1 E.U./dL
pH, UA: 5.5 (ref 5.0–8.0)

## 2018-03-10 LAB — GLUCOSE, POCT (MANUAL RESULT ENTRY): POC Glucose: 194 mg/dl — AB (ref 70–99)

## 2018-03-10 LAB — POCT GLYCOSYLATED HEMOGLOBIN (HGB A1C): Hemoglobin A1C: 7.6 % — AB (ref 4.0–5.6)

## 2018-03-10 MED ORDER — DAPAGLIFLOZIN PROPANEDIOL 5 MG PO TABS: 5.0000 mg | ORAL_TABLET | Freq: Every day | ORAL | 1 refills | Status: DC

## 2018-03-10 MED FILL — FARXIGA 5 MG TABLET: 5 | 30 days supply | Qty: 30 | Fill #0

## 2018-03-10 NOTE — Patient Instructions (Signed)
Diabetes Mellitus and Standards of Medical Care Managing diabetes (diabetes mellitus) can be complicated. Your diabetes treatment may be managed by a team of health care providers, including:  A physician who specializes in diabetes (endocrinologist).  A nurse practitioner or physician assistant.  Nurses.  A diet and nutrition specialist (registered dietitian).  A certified diabetes educator (CDE).  An exercise specialist.  A pharmacist.  An eye doctor.  A foot specialist (podiatrist).  A dentist.  A primary care provider.  A mental health provider. Your health care providers follow guidelines to help you get the best quality of care. The following schedule is a general guideline for your diabetes management plan. Your health care providers may give you more specific instructions. Physical exams Upon being diagnosed with diabetes mellitus, and each year after that, your health care provider will ask about your medical and family history. He or she will also do a physical exam. Your exam may include:  Measuring your height, weight, and body mass index (BMI).  Checking your blood pressure. This will be done at every routine medical visit. Your target blood pressure may vary depending on your medical conditions, your age, and other factors.  Thyroid gland exam.  Skin exam.  Screening for damage to your nerves (peripheral neuropathy). This may include checking the pulse in your legs and feet and checking the level of sensation in your hands and feet.  A complete foot exam to inspect the structure and skin of your feet, including checking for cuts, bruises, redness, blisters, sores, or other problems.  Screening for blood vessel (vascular) problems, which may include checking the pulse in your legs and feet and checking your temperature. Blood tests Depending on your treatment plan and your personal needs, you may have the following tests done:  HbA1c (hemoglobin A1c). This  test provides information about blood sugar (glucose) control over the previous 2-3 months. It is used to adjust your treatment plan, if needed. This test will be done: ? At least 2 times a year, if you are meeting your treatment goals. ? 4 times a year, if you are not meeting your treatment goals or if treatment goals have changed.  Lipid testing, including total, LDL, and HDL cholesterol and triglyceride levels. ? The goal for LDL is less than 100 mg/dL (5.5 mmol/L). If you are at high risk for complications, the goal is less than 70 mg/dL (3.9 mmol/L). ? The goal for HDL is 40 mg/dL (2.2 mmol/L) or higher for men and 50 mg/dL (2.8 mmol/L) or higher for women. An HDL cholesterol of 60 mg/dL (3.3 mmol/L) or higher gives some protection against heart disease. ? The goal for triglycerides is less than 150 mg/dL (8.3 mmol/L).  Liver function tests.  Kidney function tests.  Thyroid function tests. Dental and eye exams  Visit your dentist two times a year.  If you have type 1 diabetes, your health care provider may recommend an eye exam 3-5 years after you are diagnosed, and then once a year after your first exam. ? For children with type 1 diabetes, a health care provider may recommend an eye exam when your child is age 10 or older and has had diabetes for 3-5 years. After the first exam, your child should get an eye exam once a year.  If you have type 2 diabetes, your health care provider may recommend an eye exam as soon as you are diagnosed, and then once a year after your first exam. Immunizations   The  your first exam.  Immunizations    The yearly flu (influenza) vaccine is recommended for everyone 6 months or older who has diabetes.  The pneumonia (pneumococcal) vaccine is recommended for everyone 2 years or older who has diabetes. If you are 65 or older, you may get the pneumonia vaccine as a series of two separate shots.  The hepatitis B vaccine is recommended for adults shortly after being diagnosed with diabetes.  Adults and children with diabetes should receive all other vaccines  according to age-specific recommendations from the Centers for Disease Control and Prevention (CDC).  Mental and emotional health  Screening for symptoms of eating disorders, anxiety, and depression is recommended at the time of diagnosis and afterward as needed. If your screening shows that you have symptoms (positive screening result), you may need more evaluation and you may work with a mental health care provider.  Treatment plan  Your treatment plan will be reviewed at every medical visit. You and your health care provider will discuss:  How you are taking your medicines, including insulin.  Any side effects you are experiencing.  Your blood glucose target goals.  The frequency of your blood glucose monitoring.  Lifestyle habits, such as activity level as well as tobacco, alcohol, and substance use.  Diabetes self-management education  Your health care provider will assess how well you are monitoring your blood glucose levels and whether you are taking your insulin correctly. He or she may refer you to:  A certified diabetes educator to manage your diabetes throughout your life, starting at diagnosis.  A registered dietitian who can create or review your personal nutrition plan.  An exercise specialist who can discuss your activity level and exercise plan.  Summary  Managing diabetes (diabetes mellitus) can be complicated. Your diabetes treatment may be managed by a team of health care providers.  Your health care providers follow guidelines in order to help you get the best quality of care.  Standards of care including having regular physical exams, blood tests, blood pressure monitoring, immunizations, screening tests, and education about how to manage your diabetes.  Your health care providers may also give you more specific instructions based on your individual health.  This information is not intended to replace advice given to you by your health care provider. Make sure you discuss any questions you have  with your health care provider.  Document Released: 11/24/2008 Document Revised: 10/16/2017 Document Reviewed: 10/26/2015  Elsevier Interactive Patient Education  2019 Elsevier Inc.

## 2018-03-10 NOTE — Telephone Encounter (Signed)
Patient is scheduled for CPAP Titration on 04/15/18. Patient understands his titration study will be done at [facility] sleep lab. Patient understands he will receive a letter in a week or so detailing appointment, date, time, and location. Patient understands to call if he does not receive the letter  in a timely manner. Patient agrees with treatment and thanked me for call.

## 2018-03-10 NOTE — Telephone Encounter (Signed)
  Lauralee Evener, CMA  Freada Bergeron, CMA        Per chart patient has medicaid. No PA is required. Ok to schedule.     ----- Message -----  From: Freada Bergeron, CMA  Sent: 03/01/2018  5:56 PM EST  To: [facility] Sleep Studies  Subject: precert                      recommend CPAP titration.

## 2018-03-10 NOTE — Progress Notes (Signed)
Patient [facility]   Progress Note: General Provider: [name], FNP  SUBJECTIVE:   Darius [name] is a 63 y.o. male who  has a past medical history of Atrial fibrillation ([place]), Chronic diastolic heart failure ([place]), Diabetes mellitus, type 2 ([place]), cardiovascular stress test, Morbid obesity ([place]), and Sleep apnea.. Patient presents today for Diabetes Reports eating an increased amount of sugars in the last few weeks.  He denies being able to exercise.  He does states that he is active in his garden intermittently.  He does not follow a low carbohydrate diet. Patient followed by heart care for congestive heart failure.  Patient states that the EMT is following him for home care. Review of Systems  Constitutional: Negative.   HENT: Negative.   Eyes: Negative.   Respiratory: Negative.   Cardiovascular: Negative.   Gastrointestinal: Negative.   Genitourinary: Negative.   Musculoskeletal: Negative.   Skin: Negative.   Neurological: Negative.   Psychiatric/Behavioral: Negative.      OBJECTIVE: BP 106/71 (BP Location: Left Arm, Patient Position: Sitting, Cuff Size: Large)   Pulse 73   Temp 98.2 F (36.8 C) (Oral)   Resp 18   Ht 6\' 2"  (1.88 m)   Wt (!) 327 lb (148.3 kg)   SpO2 95%   BMI 41.98 kg/m   Wt Readings from Last 3 Encounters:  03/10/18 (!) 327 lb (148.3 kg)  03/04/18 (!) 329 lb (149.2 kg)  02/25/18 (!) 325 lb (147.4 kg)     Physical Exam Vitals signs and nursing note reviewed.  Constitutional:      General: He is not in acute distress.    Appearance: He is well-developed.  HENT:     Head: Normocephalic and atraumatic.  Eyes:     Conjunctiva/sclera: Conjunctivae normal.     Pupils: Pupils are equal, round, and reactive to light.  Neck:     Musculoskeletal: Normal range of motion.  Cardiovascular:     Rate and Rhythm: Normal rate and regular rhythm.     Heart sounds: Normal heart sounds.  Pulmonary:     Effort:  Pulmonary effort is normal. No respiratory distress.     Breath sounds: Normal breath sounds.  Abdominal:     General: Bowel sounds are normal. There is no distension.     Palpations: Abdomen is soft.  Musculoskeletal: Normal range of motion.  Skin:    General: Skin is warm and dry.  Neurological:     Mental Status: He is alert and oriented to person, place, and time.  Psychiatric:        Behavior: Behavior normal.        Thought Content: Thought content normal.     ASSESSMENT/PLAN:   1. Type 2 diabetes mellitus with hyperosmolarity without coma, without long-term current use of insulin (HCC) Glucosuria noted today on urinalysis.  A1c revealed increase from 6.2-7.6.  Will start Farxiga 5 mg every day.  Patient to return in 4 weeks for follow-up. - Urinalysis Dipstick - Microalbumin/Creatinine Ratio, Urine - HgB A1c - Glucose (CBG) - dapagliflozin propanediol (FARXIGA) 5 MG TABS tablet; Take 5 mg by mouth daily.  Dispense: 90 tablet; Refill: 1    Return in about 4 weeks (around 04/07/2018) for DM.    The patient was given clear instructions to go to ER or return to medical center if symptoms do not improve, worsen or new problems develop. The patient verbalized understanding and agreed with plan of care.   Ms.  Andr L. Nathaneil Canary, FNP-BC [facility] [address] [place], [place] [address] [phone]

## 2018-03-11 LAB — MICROALBUMIN / CREATININE URINE RATIO
Creatinine, Urine: 297.5 mg/dL
Microalb/Creat Ratio: 29 mg/g creat (ref 0–29)
Microalbumin, Urine: 85.6 ug/mL

## 2018-03-12 ENCOUNTER — Encounter: Payer: Self-pay | Admitting: *Deleted

## 2018-03-12 ENCOUNTER — Telehealth (HOSPITAL_COMMUNITY): Payer: Self-pay

## 2018-03-12 NOTE — Telephone Encounter (Signed)
I called Darius [name] to let him know I would be off on Thursday and see if he was available for an appointment. He did not answer so I left a voicemail requesting he call me back about either meeting today or Friday.

## 2018-03-12 NOTE — Telephone Encounter (Signed)
I called Mr Meno to see if he was available to meet today. He did not answer so I left a voicemail requesting he call me back.

## 2018-03-16 ENCOUNTER — Encounter: Payer: Self-pay | Admitting: Cardiology

## 2018-03-16 ENCOUNTER — Telehealth (HOSPITAL_COMMUNITY): Payer: Self-pay

## 2018-03-16 ENCOUNTER — Telehealth: Payer: Self-pay | Admitting: Cardiology

## 2018-03-16 ENCOUNTER — Ambulatory Visit (INDEPENDENT_AMBULATORY_CARE_PROVIDER_SITE_OTHER): Payer: Medicaid Other | Admitting: Cardiology

## 2018-03-16 VITALS — BP 104/70 | HR 91 | Ht 74.0 in | Wt 327.2 lb

## 2018-03-16 DIAGNOSIS — I4819 Other persistent atrial fibrillation: Secondary | ICD-10-CM | POA: Diagnosis not present

## 2018-03-16 MED ORDER — METOPROLOL SUCCINATE ER 100 MG PO TB24: 100.0000 mg | ORAL_TABLET | Freq: Two times a day (BID) | ORAL | 1 refills | Status: DC

## 2018-03-16 MED ORDER — METOPROLOL SUCCINATE ER 100 MG PO TB24: 100.0000 mg | ORAL_TABLET | Freq: Every day | ORAL | 1 refills | Status: DC

## 2018-03-16 NOTE — Telephone Encounter (Signed)
Sent updated Rx for BID dosing.  (Rx sent earlier only reflected daily) PhD informed at [place]

## 2018-03-16 NOTE — Patient Instructions (Addendum)
Medication Instructions:  Your physician has recommended you make the following change in your medication:  1. INCREASE Toprol to 100 mg twice daily  * If you need a refill on your cardiac medications before your next appointment, please call your pharmacy.   Labwork: None ordered  Testing/Procedures: None ordered  Follow-Up: Your physician wants you to follow-up in: 6 months with Dr. Curt Bears.  You will receive a reminder letter in the mail two months in advance. If you don't receive a letter, please call our office to schedule the follow-up appointment.  Thank you for choosing [facility]!!   Trinidad Curet, RN [phone]  Any Other Special Instructions Will Be Listed Below (If Applicable).   Bariatric Surgery You have so much to gain by losing weight.  You may have already tried every diet and exercise plan imaginable.  And, you may have sought advice from your family physician, too.   Sometimes, in spite of such diligent efforts, you may not be able to achieve long-term results by yourself.  In cases of severe obesity, bariatric or weight loss surgery is a proven method of achieving long-term weight control.  Our Services Our bariatric surgery programs offer our patients new hope and long-term weight-loss solution.  Since introducing our services in 2003, we have conducted more than 2,400 successful procedures.  Our program is designated as a Programmer, multimedia by the Metabolic and Bariatric Surgery Accreditation and Quality Improvement Program (MBSAQIP), a IT trainer that sets rigorous patient safety and outcome standards.  Our program is also designated as a Ecologist by [employer].   Our exceptional weight-loss surgery team specializes in diagnosis, treatment, follow-up care, and ongoing support for our patients with severe weight loss challenges.  We currently offer laparoscopic sleeve gastrectomy, gastric bypass, and adjustable  gastric band (LAP-BAND).    Attend our [place] Choosing to undergo a bariatric procedure is a big decision, and one that should not be taken lightly.  You now have two options in how you learn about weight-loss surgery - in person or online.  Our objective is to ensure you have all of the information that you need to evaluate the advantages and obligations of this life changing procedure.  Please note that you are not alone in this process, and our experienced team is ready to assist and answer all of your questions.  There are several ways to register for a seminar (either on-line or in person): 1)  Call [phone] 2) Go on-line to [facility] and register for either type of seminar.  [email or web address]

## 2018-03-16 NOTE — Progress Notes (Signed)
Electrophysiology Office Note   Date:  03/16/2018   ID:  Darius [name], DOB 1955-05-23, MRN [id number]  PCP:  [name], FNP  Cardiologist:  [name] Primary Electrophysiologist:  [name], MD    No chief complaint on file.    History of Present Illness: Darius [name] is a 63 y.o. male who is being seen today for the evaluation of atrial fibrillation at the request of Loralie Champagne. Presenting today for electrophysiology evaluation.  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  04/2016 he was admitted for acute systolic heart failure and atrial fibrillation with rapid rates.  Right and left heart catheterization showed no coronary disease and elevated filling pressures.  Echo showed an EF of 15%.  He was found to have a left atrial appendage thrombus and was started on Eliquis.  He had a cardioversion 08/2016 with reverted to sinus rhythm.  Echo showed an EF of 40 to 45%.  Most recent echo shows a normal ejection fraction.  Today, denies symptoms of palpitations, chest pain, shortness of breath, orthopnea, PND, lower extremity edema, claudication, dizziness, presyncope, syncope, bleeding, or neurologic sequela. The patient is tolerating medications without difficulties.  Overall he is doing well.  He has no chest pain or shortness of breath.  He is unaware of atrial fibrillation.  He does not think he has been out of rhythm since last being seen.  Past Medical History:  Diagnosis Date  . Atrial fibrillation ([place])    unable to tolerate versed and fentanyl sedation and required gen anesthesia for TEE;  s/p TEE-DCCV (failed);  Amiodarone started  . Chronic diastolic heart failure ([place])    a. Echo 03/27/11: EF 50-55%, inferior hypokinesis, moderate LAE, mild RVE, normal pulmonary pressures. ;   b.  TEE (04/03/11): EF 40-45%, mild MR moderate LAE, no LAA clot, mild RAE;   c.  Echocardiogram (02/2013): EF 60-65%, Gr 2 DD, mildly dilated Ao root (Ao root dimension 39 mm), MAC, mild LAE, normal RVF  . Diabetes mellitus, type 2 ([place])   . Hx of cardiovascular stress test    Lexiscan Myoview (02/2013): No ischemia or scar, EF 51%, low risk  . Morbid obesity ([place])   . Sleep apnea    a. sleep test 01/2013:  very severy OSA   Past Surgical History:  Procedure Laterality Date  . CARDIOVERSION  04/01/2011   Procedure: CARDIOVERSION;  Surgeon: Lelon Perla, MD;  Location: [place];  Service: Cardiovascular;  Laterality: N/A;  . CARDIOVERSION  04/03/2011   Procedure: CARDIOVERSION;  Surgeon: Lelon Perla, MD;  Location: [place];  Service: Cardiovascular;  Laterality: N/A;  . CARDIOVERSION  04/03/2011   Procedure: CARDIOVERSION;  Surgeon: Lelon Perla, MD;  Location: [place];  Service: Cardiovascular;  Laterality: N/A;  . CARDIOVERSION N/A 06/15/2014   Procedure: CARDIOVERSION;  Surgeon: [name], MD;  Location[facility] ENDOSCOPY;  Service: Cardiovascular;  Laterality: N/A;  . CARDIOVERSION N/A 09/08/2016   Procedure: CARDIOVERSION;  Surgeon: [name], MD;  Location: [facility] ENDOSCOPY;  Service: Cardiovascular;  Laterality: N/A;  . I&D EXTREMITY Right 09/19/2015   Procedure: IRRIGATION AND DEBRIDEMENT EXTREMITY;  Surgeon: Mcarthur Rossetti, MD;  Location: [place];  Service: Orthopedics;  Laterality: Right;  . RIGHT/LEFT HEART CATH AND CORONARY ANGIOGRAPHY N/A 04/21/2016   Procedure: Right/Left Heart Cath and Coronary Angiography;  Surgeon: [name], MD;  Location: [place] CV LAB;  Service: Cardiovascular;  Laterality: N/A;  . TEE WITHOUT CARDIOVERSION  04/01/2011   Procedure: TRANSESOPHAGEAL ECHOCARDIOGRAM (TEE);  Surgeon: Lelon Perla, MD;  Location: [facility] ENDOSCOPY;  Service: Cardiovascular;  Laterality: N/A;  . TEE WITHOUT CARDIOVERSION  04/03/2011   Procedure: TRANSESOPHAGEAL ECHOCARDIOGRAM (TEE);  Surgeon: Lelon Perla, MD;  Location:  [facility] ENDOSCOPY;  Service: Cardiovascular;  Laterality: N/A;  . TEE WITHOUT CARDIOVERSION  04/03/2011   Procedure: TRANSESOPHAGEAL ECHOCARDIOGRAM (TEE);  Surgeon: Lelon Perla, MD;  Location: [place];  Service: Cardiovascular;  Laterality: N/A;  . TEE WITHOUT CARDIOVERSION N/A 04/24/2016   Procedure: TRANSESOPHAGEAL ECHOCARDIOGRAM (TEE);  Surgeon: [name], MD;  Location: [place];  Service: Cardiovascular;  Laterality: N/A;  . TEE WITHOUT CARDIOVERSION N/A 05/26/2016   Procedure: TRANSESOPHAGEAL ECHOCARDIOGRAM (TEE);  Surgeon: [name], MD;  Location: [place];  Service: Cardiovascular;  Laterality: N/A;  . TEE WITHOUT CARDIOVERSION N/A 09/08/2016   Procedure: TRANSESOPHAGEAL ECHOCARDIOGRAM (TEE);  Surgeon: [name], MD;  Location: [facility] ENDOSCOPY;  Service: Cardiovascular;  Laterality: N/A;     Current Outpatient Medications  Medication Sig Dispense Refill  . amiodarone (PACERONE) 200 MG tablet Take 1 tablet (200 mg total) by mouth daily. 30 tablet 2  . apixaban (ELIQUIS) 5 MG TABS tablet Take 1 tablet (5 mg total) by mouth 2 (two) times daily. 60 tablet 5  . atorvastatin (LIPITOR) 80 MG tablet TAKE 1 TABLET (80 MG TOTAL) BY MOUTH AT BEDTIME. 30 tablet 11  . dapagliflozin propanediol (FARXIGA) 5 MG TABS tablet Take 5 mg by mouth daily. 90 tablet 1  . furosemide (LASIX) 40 MG tablet TAKE 1 TABLET BY MOUTH ONCE DAILY 100 tablet 2  . ketoconazole (NIZORAL) 2 % shampoo Apply 1 application topically 2 (two) times a week. 120 mL 0  . metFORMIN (GLUCOPHAGE) 1000 MG tablet TAKE 1 TABLET BY MOUTH TWICE A DAY WITH FOOD 60 tablet 3  . nystatin-triamcinolone ointment (MYCOLOG) Apply 1 application topically 2 (two) times daily. 60 g 1  . sacubitril-valsartan (ENTRESTO) 97-103 MG Take 1 tablet by mouth 2 (two) times daily. 180 tablet 3  . spironolactone (ALDACTONE) 25 MG tablet TAKE 1 TABLET BY MOUTH ONCE DAILY 30 tablet 5   No current facility-administered medications for this  visit.     Allergies:   Patient has no known allergies.   Social History:  The patient  reports that he has never smoked. He has never used smokeless tobacco. He reports current alcohol use. He reports that he does not use drugs.   Family History:  The patient's family history includes Diabetes in his father; Hypertension in his father, paternal grandfather, and sister; Seizures in his mother.    ROS:  Please see the history of present illness.   Otherwise, review of systems is positive for weight gain.   All other systems are reviewed and negative.   PHYSICAL EXAM: VS:  BP 104/70   Pulse 91   Ht 6' 2"  (1.88 m)   Wt (!) 327 lb 3.2 oz (148.4 kg)   SpO2 95%   BMI 42.01 kg/m  , BMI Body mass index is 42.01 kg/m. GEN: Well nourished, well developed, in no acute distress  HEENT: normal  Neck: no JVD, carotid bruits, or masses Cardiac: RRR; no murmurs, rubs, or gallops,no edema  Respiratory:  clear to auscultation bilaterally, normal work of breathing GI: soft, nontender, nondistended, + BS MS: no deformity or atrophy  Skin: warm and dry Neuro:  Strength and sensation are  intact Psych: euthymic mood, full affect  EKG:  EKG is ordered today. Personal review of the ekg ordered shows SR, long QTc, rate 91  Recent Labs: 10/02/2017: TSH 0.083 01/22/2018: ALT 25; BUN 18; Creatinine, Ser 1.10; Potassium 4.1; Sodium 137    Lipid Panel     Component Value Date/Time   CHOL 120 12/12/2016 0928   TRIG 77 12/12/2016 0928   HDL 62 12/12/2016 0928   CHOLHDL 1.9 12/12/2016 0928   VLDL 17 04/18/2016 1459   LDLCALC 42 12/12/2016 0928     Wt Readings from Last 3 Encounters:  03/16/18 (!) 327 lb 3.2 oz (148.4 kg)  03/10/18 (!) 327 lb (148.3 kg)  03/04/18 (!) 329 lb (149.2 kg)      Other studies Reviewed: Additional studies/ records that were reviewed today include: TTE 09/11/17  Review of the above records today demonstrates:  - Left ventricle: The cavity size was normal. Wall  thickness was   increased in a pattern of mild LVH. Systolic function was normal.   The estimated ejection fraction was in the range of 55% to 60%.   Although no diagnostic regional wall motion abnormality was   identified, this possibility cannot be completely excluded on the   basis of this study. GLS -19.1%. - Aortic valve: There was no stenosis. - Mitral valve: There was no significant regurgitation. - Right ventricle: The cavity size was normal. Systolic function   was normal. - Pulmonary arteries: No complete TR doppler jet so unable to   estimate PA systolic pressure. - Inferior vena cava: The vessel was normal in size. The   respirophasic diameter changes were in the normal range (>= 50%),   consistent with normal central venous pressure.   ASSESSMENT AND PLAN:  1.  Persistent atrial fibrillation: Currently on amiodarone and Eliquis, remains in sinus rhythm.  Sleep study showed severe sleep apnea.  At this point, I will hold off on ablation.  I have told him that it would be advisable to lose weight.  We have given him the number to bariatric surgery.  Ordered to start exercising 150 minutes a week.  I would like him to lose some weight prior to ablation.  We will continue amiodarone.  This patients CHA2DS2-VASc Score and unadjusted Ischemic Stroke Rate (% per year) is equal to 2.2 % stroke rate/year from a score of 2  Above score calculated as 1 point each if present [CHF, HTN, DM, Vascular=MI/PAD/Aortic Plaque, Age if 65-74, or Male] Above score calculated as 2 points each if present [Age > 75, or Stroke/TIA/TE]  2.  Chronic systolic heart failure: Currently on Entresto, Aldactone, Toprol-XL.  No changes.  3.  Hyperlipidemia: Continue statin  4.  Obstructive sleep apnea: Recent sleep study showed severe sleep apnea and was placed on CPAP.  Compliance encouraged.  Current medicines are reviewed at length with the patient today.   The patient does not have concerns  regarding his medicines.  The following changes were made today: Increase Toprol-XL to 100 mg twice a day.  Labs/ tests ordered today include:  Orders Placed This Encounter  Procedures  . EKG 12-Lead   Case discussed with primary cardiology  Disposition:   FU with Will Camnitz 6 months  Signed, [name], MD  03/16/2018 9:43 AM     [facility] [address] [place] [address] [phone] (office) [phone] (fax)

## 2018-03-16 NOTE — Telephone Encounter (Signed)
I called Darius [name] to schedule an appointment. He stated he was able to meet any day but not early in the morning. We agreed to meet at the [employer] at 34:94 on Wednesday.

## 2018-03-16 NOTE — Telephone Encounter (Signed)
New Message   Pt c/o medication issue:  1. Name of Medication: Metoprolol  2. How are you currently taking this medication (dosage and times per day)? 100 mg daily   3. Are you having a reaction (difficulty breathing--STAT)? No   4. What is your medication issue? Pharmacist says the medication says a dose increase but the message is to be a decrease  Please advise

## 2018-03-17 ENCOUNTER — Telehealth (HOSPITAL_COMMUNITY): Payer: Self-pay

## 2018-03-17 NOTE — Telephone Encounter (Signed)
I arrived at Darius [name]' home after waiting for him at [employer] for over 30 minutes and being unable to reach him on the phone. His truch was in the driveway so I began knocking on the doors and windows all around the house. After nearly 10 minutes of knocking, shouting and calling his phone, Darius [name] had still not answered the door. I requested [employer] respond for a check welfare. While waiting for Darius [name], I attempted to call his sister, who is listed as an emergency contact however she did not answer my phone call. Upon [employer] arrival, they checked his doors and continued to knocking and announcing themselves loudly. Darius [name] still did not come to the door. At this point I made the decision to try removing one of the window A/C units and gain entry to Darius Fernando' home. Once the window unit was out I began shouting through the window and Darius [name] did not respond. I attempted to climb into the window however I did not have adequate footing so I backed out and called for the fire department to bring a ladder. Before they could arrive, Darius [name] emerged from the hallway and advised he had been asleep and the TV was on which prevented him from hearing [place] yell and bang on the dors and windows. He advised that he was fine and just forgot about our appointment. We agreed to meet tomorrow at 15:30 at [employer].

## 2018-03-17 NOTE — Telephone Encounter (Signed)
I called Mr Darius [name] to check on him after he did not arrive to our scheduled appointment. He did not answer so I left a voicemail requesting that he call me back to ensure that he is well. We scheduled this appointment yesterday on the day and time of his choosing however it is not uncommon for him for be forgetful. I will continue trying to reach him via phone and go by his home for a welfare check.

## 2018-03-18 ENCOUNTER — Other Ambulatory Visit (HOSPITAL_COMMUNITY): Payer: Self-pay

## 2018-03-18 MED FILL — SPIRONOLACTONE 25 MG TABLET: 25 | 30 days supply | Qty: 30 | Fill #2

## 2018-03-18 NOTE — Progress Notes (Signed)
Paramedicine Encounter    Patient ID: Darius [name], male    DOB: 06/26/1955, 63 y.o.   MRN: [id number]   Patient Care Team: Darius [name], [place] as PCP - General (Family Medicine) Darius Haw, MD as PCP - Electrophysiology (Cardiology) Darius Dresser, MD as Consulting Physician (Cardiology) Darius Ny, LCSW as Social Worker (Licensed Clinical Social Worker)  Patient Active Problem List   Diagnosis Date Noted  . Urine leukocytes 05/24/2017  . Atrial fibrillation with RVR ([place]) 04/17/2016  . Elevated troponin 04/17/2016  . CHF (congestive heart failure) ([place]) 04/17/2016  . Septic olecranon bursitis of right elbow 09/19/2015  . Lactic acid acidosis   . Dilated cardiomyopathy ([place]) 04/19/2014  . Type 2 diabetes mellitus ([place]) 04/09/2014  . Non compliance with medical treatment and diet 04/09/2014  . Chronic anticoagulation-Eliquis started 04/06/14 04/09/2014  . Abnormal chest CT-LUL nodule- needs f/u May 2016 04/09/2014  . Acute on chronic combined systolic and diastolic heart failure ([place]) 04/05/2014  . OSA - C-pap 03/22/2013  . Persistent atrial fibrillation ([place]) 03/27/2011  . Obesity, Class III, BMI 40-49.9 (morbid obesity) ([place]) 03/27/2011  . URI, acute 03/27/2011    Current Outpatient Medications:  .  amiodarone (PACERONE) 200 MG tablet, Take 1 tablet (200 mg total) by mouth daily., Disp: 30 tablet, Rfl: 2 .  apixaban (ELIQUIS) 5 MG TABS tablet, Take 1 tablet (5 mg total) by mouth 2 (two) times daily., Disp: 60 tablet, Rfl: 5 .  atorvastatin (LIPITOR) 80 MG tablet, TAKE 1 TABLET (80 MG TOTAL) BY MOUTH AT BEDTIME., Disp: 30 tablet, Rfl: 11 .  dapagliflozin propanediol (FARXIGA) 5 MG TABS tablet, Take 5 mg by mouth daily., Disp: 90 tablet, Rfl: 1 .  furosemide (LASIX) 40 MG tablet, TAKE 1 TABLET BY MOUTH ONCE DAILY, Disp: 100 tablet, Rfl: 2 .  metFORMIN (GLUCOPHAGE) 1000 MG tablet, TAKE 1 TABLET BY MOUTH TWICE A DAY WITH FOOD, Disp: 60 tablet, Rfl: 3 .  metoprolol  succinate (TOPROL-XL) 100 MG 24 hr tablet, Take 1 tablet (100 mg total) by mouth 2 (two) times daily. Take with or immediately following a meal., Disp: 180 tablet, Rfl: 1 .  sacubitril-valsartan (ENTRESTO) 97-103 MG, Take 1 tablet by mouth 2 (two) times daily., Disp: 180 tablet, Rfl: 3 .  spironolactone (ALDACTONE) 25 MG tablet, TAKE 1 TABLET BY MOUTH ONCE DAILY, Disp: 30 tablet, Rfl: 5 .  ketoconazole (NIZORAL) 2 % shampoo, Apply 1 application topically 2 (two) times a week., Disp: 120 mL, Rfl: 0 .  nystatin-triamcinolone ointment (MYCOLOG), Apply 1 application topically 2 (two) times daily., Disp: 60 g, Rfl: 1 No Known Allergies    Social History   Socioeconomic History  . Marital status: Single    Spouse name: Not on file  . Number of children: 0  . Years of education: 79  . Highest education level: Not on file  Occupational History  . Occupation: unemployeed  Social Needs  . Financial resource strain: Hard  . Food insecurity:    Worry: Sometimes true    Inability: Sometimes true  . Transportation needs:    Medical: No    Non-medical: No  Tobacco Use  . Smoking status: Never Smoker  . Smokeless tobacco: Never Used  Substance and Sexual Activity  . Alcohol use: Yes    Comment: some  . Drug use: No  . Sexual activity: Never  Lifestyle  . Physical activity:    Days per week: Not on file    Minutes per session: Not  on file  . Stress: Not on file  Relationships  . Social connections:    Talks on phone: Not on file    Gets together: Not on file    Attends religious service: Not on file    Active member of club or organization: Not on file    Attends meetings of clubs or organizations: Not on file    Relationship status: Not on file  . Intimate partner violence:    Fear of current or ex partner: Not on file    Emotionally abused: Not on file    Physically abused: Not on file    Forced sexual activity: Not on file  Other Topics Concern  . Not on file  Social History  Narrative   Admits that he eats poorly and eats a lot at work.      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    Physical Exam Cardiovascular:     Rate and Rhythm: Normal rate and regular rhythm.     Pulses: Normal pulses.  Pulmonary:     Effort: Pulmonary effort is normal.     Breath sounds: Normal breath sounds.  Musculoskeletal: Normal range of motion.     Right lower leg: No edema.     Left lower leg: Edema present.  Skin:    General: Skin is warm and dry.     Capillary Refill: Capillary refill takes less than 2 seconds.  Neurological:     Mental Status: He is alert and oriented to person, place, and time.  Psychiatric:        Mood and Affect: Mood normal.         Future Appointments  Date Time Provider [place]  04/07/2018 11:00 AM Darius Boast, FNP [facility] None  04/15/2018  8:00 PM [facility] ROOM 4 [facility]    BP 110/70 (BP Location: Left Arm, Patient Position: Sitting, Cuff Size: Large)   Pulse 72   Resp 16   Wt (!) 327 lb (148.3 kg)   SpO2 96%   BMI 41.98 kg/m   Weight yesterday- 327 lb Last visit weight- 329  Darius [name] was seen at the [employer] today and reported feeling well. He denied chest pain, SOB, headache, dizziness or orthopnea. He stated he has been compliant with his medications over the past week and reports his weight has been stable, despite being unable to weigh today and eating an entire pizza yesterday. I explained the importance of daily weights and he expressed understanding but said his scale was in his car and it was raining so he did not want to go get it. His medications were review and he refilled his own pillbox while I observed. The only medication ordered today was spironolactone.   Darius [name], EMT 03/18/18  ACTION: Home visit completed Next visit planned for 1 week

## 2018-03-25 ENCOUNTER — Other Ambulatory Visit (HOSPITAL_COMMUNITY): Payer: Self-pay

## 2018-03-25 MED FILL — ATORVASTATIN CALCIUM 80 MG: 80 | 30 days supply | Qty: 30 | Fill #1

## 2018-03-25 NOTE — Progress Notes (Signed)
Paramedicine Encounter    Patient ID: Darius [name], male    DOB: [birth year], 63 y.o.   MRN: [id number]   Patient Care Team: [name], [place] as PCP - General (Family Medicine) Constance Haw, MD as PCP - Electrophysiology (Cardiology) [name], MD as Consulting Physician (Cardiology) Jorge Ny, LCSW as Social Worker (Licensed Clinical Social Worker)  Patient Active Problem List   Diagnosis Date Noted  . Urine leukocytes 05/24/2017  . Atrial fibrillation with RVR ([place]) 04/17/2016  . Elevated troponin 04/17/2016  . CHF (congestive heart failure) ([place]) 04/17/2016  . Septic olecranon bursitis of right elbow 09/19/2015  . Lactic acid acidosis   . Dilated cardiomyopathy ([place]) 04/19/2014  . Type 2 diabetes mellitus ([place]) 04/09/2014  . Non compliance with medical treatment and diet 04/09/2014  . Chronic anticoagulation-Eliquis started 04/06/14 04/09/2014  . Abnormal chest CT-LUL nodule- needs f/u May 2016 04/09/2014  . Acute on chronic combined systolic and diastolic heart failure ([place]) 04/05/2014  . OSA - C-pap 03/22/2013  . Persistent atrial fibrillation ([place]) 03/27/2011  . Obesity, Class III, BMI 40-49.9 (morbid obesity) ([place]) 03/27/2011  . URI, acute 03/27/2011    Current Outpatient Medications:  .  amiodarone (PACERONE) 200 MG tablet, Take 1 tablet (200 mg total) by mouth daily., Disp: 30 tablet, Rfl: 2 .  apixaban (ELIQUIS) 5 MG TABS tablet, Take 1 tablet (5 mg total) by mouth 2 (two) times daily., Disp: 60 tablet, Rfl: 5 .  atorvastatin (LIPITOR) 80 MG tablet, TAKE 1 TABLET (80 MG TOTAL) BY MOUTH AT BEDTIME., Disp: 30 tablet, Rfl: 11 .  dapagliflozin propanediol (FARXIGA) 5 MG TABS tablet, Take 5 mg by mouth daily., Disp: 90 tablet, Rfl: 1 .  furosemide (LASIX) 40 MG tablet, TAKE 1 TABLET BY MOUTH ONCE DAILY, Disp: 100 tablet, Rfl: 2 .  metFORMIN (GLUCOPHAGE) 1000 MG tablet, TAKE 1 TABLET BY MOUTH TWICE A DAY WITH FOOD, Disp: 60 tablet, Rfl: 3 .  metoprolol  succinate (TOPROL-XL) 100 MG 24 hr tablet, Take 1 tablet (100 mg total) by mouth 2 (two) times daily. Take with or immediately following a meal., Disp: 180 tablet, Rfl: 1 .  sacubitril-valsartan (ENTRESTO) 97-103 MG, Take 1 tablet by mouth 2 (two) times daily., Disp: 180 tablet, Rfl: 3 .  spironolactone (ALDACTONE) 25 MG tablet, TAKE 1 TABLET BY MOUTH ONCE DAILY, Disp: 30 tablet, Rfl: 5 .  ketoconazole (NIZORAL) 2 % shampoo, Apply 1 application topically 2 (two) times a week., Disp: 120 mL, Rfl: 0 .  nystatin-triamcinolone ointment (MYCOLOG), Apply 1 application topically 2 (two) times daily., Disp: 60 g, Rfl: 1 No Known Allergies    Social History   Socioeconomic History  . Marital status: Single    Spouse name: Not on file  . Number of children: 0  . Years of education: 21  . Highest education level: Not on file  Occupational History  . Occupation: unemployeed  Social Needs  . Financial resource strain: Hard  . Food insecurity:    Worry: Sometimes true    Inability: Sometimes true  . Transportation needs:    Medical: No    Non-medical: No  Tobacco Use  . Smoking status: Never Smoker  . Smokeless tobacco: Never Used  Substance and Sexual Activity  . Alcohol use: Yes    Comment: some  . Drug use: No  . Sexual activity: Never  Lifestyle  . Physical activity:    Days per week: Not on file    Minutes per session: Not  on file  . Stress: Not on file  Relationships  . Social connections:    Talks on phone: Not on file    Gets together: Not on file    Attends religious service: Not on file    Active member of club or organization: Not on file    Attends meetings of clubs or organizations: Not on file    Relationship status: Not on file  . Intimate partner violence:    Fear of current or ex partner: Not on file    Emotionally abused: Not on file    Physically abused: Not on file    Forced sexual activity: Not on file  Other Topics Concern  . Not on file  Social History  Narrative   Admits that Darius [name] eats poorly and eats a lot at work.      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    Physical Exam Cardiovascular:     Rate and Rhythm: Normal rate and regular rhythm.     Pulses: Normal pulses.  Pulmonary:     Effort: Pulmonary effort is normal.     Breath sounds: Normal breath sounds.  Musculoskeletal: Normal range of motion.     Right lower leg: Edema present.     Left lower leg: Edema present.  Skin:    General: Skin is warm and dry.     Capillary Refill: Capillary refill takes less than 2 seconds.  Neurological:     Mental Status: Darius [name] is alert and oriented to person, place, and time.  Psychiatric:        Mood and Affect: Mood normal.         Future Appointments  Date Time Provider [place]  04/07/2018 11:00 AM [name], FNP [facility] None  04/15/2018  8:00 PM [facility] ROOM 4 [facility]    BP 120/72 (BP Location: Left Arm, Patient Position: Sitting, Cuff Size: Large)   Pulse 88   Resp 18   Wt (!) 327 lb (148.3 kg)   SpO2 94%   BMI 41.98 kg/m   Weight yesterday- 327 lb Last visit weight- 327 lb  Darius [name] was seen at the [facility] today and reported feeling well. Darius [name] denied chest pain, SOB, headache or orthopnea. Darius [name] stated Darius [name] has been compliant with his medication over the past week and his weight has been stable. His medications were verified and Darius [name] was supervised while Darius [name] filled his own pillbox. Nothing further was needed during today's visit.   Jacquiline Doe, EMT 03/25/18  ACTION: Home visit completed Next visit planned for 1 week

## 2018-03-31 ENCOUNTER — Telehealth (HOSPITAL_COMMUNITY): Payer: Self-pay

## 2018-03-31 NOTE — Telephone Encounter (Signed)
I called Darius [name] to schedule an appointment. He did not answer so I left a message requesting he call me back.

## 2018-04-01 ENCOUNTER — Other Ambulatory Visit (HOSPITAL_COMMUNITY): Payer: Self-pay

## 2018-04-01 MED FILL — METOPROLOL SUCCINATE ER 100: 100 | 30 days supply | Qty: 60 | Fill #0 | Status: TO

## 2018-04-01 MED FILL — FARXIGA 5 MG TABLET: 5 | 30 days supply | Qty: 30 | Fill #1 | Status: TO

## 2018-04-01 MED FILL — metFORMIN HCL 1000 MG TABS: 1000 | 30 days supply | Qty: 60 | Fill #1 | Status: TO

## 2018-04-01 MED FILL — AMIODARONE HCL 200 MG TAB: 200 | 30 days supply | Qty: 30 | Fill #2

## 2018-04-01 NOTE — Progress Notes (Signed)
Paramedicine Encounter    Patient ID: Darius [name], male    DOB: 1955/02/18, 63 y.o.   MRN: [id number]   Patient Care Team: [name], [place] as PCP - General (Family Medicine) Constance Haw, MD as PCP - Electrophysiology (Cardiology) [name], MD as Consulting Physician (Cardiology) Jorge Ny, LCSW as Social Worker (Licensed Clinical Social Worker)  Patient Active Problem List   Diagnosis Date Noted  . Urine leukocytes 05/24/2017  . Atrial fibrillation with RVR ([place]) 04/17/2016  . Elevated troponin 04/17/2016  . CHF (congestive heart failure) ([place]) 04/17/2016  . Septic olecranon bursitis of right elbow 09/19/2015  . Lactic acid acidosis   . Dilated cardiomyopathy ([place]) 04/19/2014  . Type 2 diabetes mellitus ([place]) 04/09/2014  . Non compliance with medical treatment and diet 04/09/2014  . Chronic anticoagulation-Eliquis started 04/06/14 04/09/2014  . Abnormal chest CT-LUL nodule- needs f/u May 2016 04/09/2014  . Acute on chronic combined systolic and diastolic heart failure ([place]) 04/05/2014  . OSA - C-pap 03/22/2013  . Persistent atrial fibrillation ([place]) 03/27/2011  . Obesity, Class III, BMI 40-49.9 (morbid obesity) ([place]) 03/27/2011  . URI, acute 03/27/2011    Current Outpatient Medications:  .  amiodarone (PACERONE) 200 MG tablet, Take 1 tablet (200 mg total) by mouth daily., Disp: 30 tablet, Rfl: 2 .  apixaban (ELIQUIS) 5 MG TABS tablet, Take 1 tablet (5 mg total) by mouth 2 (two) times daily., Disp: 60 tablet, Rfl: 5 .  atorvastatin (LIPITOR) 80 MG tablet, TAKE 1 TABLET (80 MG TOTAL) BY MOUTH AT BEDTIME., Disp: 30 tablet, Rfl: 11 .  dapagliflozin propanediol (FARXIGA) 5 MG TABS tablet, Take 5 mg by mouth daily., Disp: 90 tablet, Rfl: 1 .  furosemide (LASIX) 40 MG tablet, TAKE 1 TABLET BY MOUTH ONCE DAILY, Disp: 100 tablet, Rfl: 2 .  metFORMIN (GLUCOPHAGE) 1000 MG tablet, TAKE 1 TABLET BY MOUTH TWICE A DAY WITH FOOD, Disp: 60 tablet, Rfl: 3 .  metoprolol  succinate (TOPROL-XL) 100 MG 24 hr tablet, Take 1 tablet (100 mg total) by mouth 2 (two) times daily. Take with or immediately following a meal., Disp: 180 tablet, Rfl: 1 .  sacubitril-valsartan (ENTRESTO) 97-103 MG, Take 1 tablet by mouth 2 (two) times daily., Disp: 180 tablet, Rfl: 3 .  spironolactone (ALDACTONE) 25 MG tablet, TAKE 1 TABLET BY MOUTH ONCE DAILY, Disp: 30 tablet, Rfl: 5 .  ketoconazole (NIZORAL) 2 % shampoo, Apply 1 application topically 2 (two) times a week., Disp: 120 mL, Rfl: 0 .  nystatin-triamcinolone ointment (MYCOLOG), Apply 1 application topically 2 (two) times daily., Disp: 60 g, Rfl: 1 No Known Allergies    Social History   Socioeconomic History  . Marital status: Single    Spouse name: Not on file  . Number of children: 0  . Years of education: 80  . Highest education level: Not on file  Occupational History  . Occupation: unemployeed  Social Needs  . Financial resource strain: Hard  . Food insecurity:    Worry: Sometimes true    Inability: Sometimes true  . Transportation needs:    Medical: No    Non-medical: No  Tobacco Use  . Smoking status: Never Smoker  . Smokeless tobacco: Never Used  Substance and Sexual Activity  . Alcohol use: Yes    Comment: some  . Drug use: No  . Sexual activity: Never  Lifestyle  . Physical activity:    Days per week: Not on file    Minutes per session: Not  on file  . Stress: Not on file  Relationships  . Social connections:    Talks on phone: Not on file    Gets together: Not on file    Attends religious service: Not on file    Active member of club or organization: Not on file    Attends meetings of clubs or organizations: Not on file    Relationship status: Not on file  . Intimate partner violence:    Fear of current or ex partner: Not on file    Emotionally abused: Not on file    Physically abused: Not on file    Forced sexual activity: Not on file  Other Topics Concern  . Not on file  Social History  Narrative   Admits that he eats poorly and eats a lot at work.      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    Physical Exam Cardiovascular:     Rate and Rhythm: Normal rate and regular rhythm.     Pulses: Normal pulses.  Pulmonary:     Effort: Pulmonary effort is normal.     Breath sounds: Normal breath sounds.  Musculoskeletal: Normal range of motion.     Right lower leg: Edema present.     Left lower leg: Edema present.  Skin:    General: Skin is warm and dry.     Capillary Refill: Capillary refill takes less than 2 seconds.  Neurological:     Mental Status: He is alert and oriented to person, place, and time.  Psychiatric:        Mood and Affect: Mood normal.         Future Appointments  Date Time Provider [place]  04/07/2018 11:00 AM [name], FNP [facility] None  04/15/2018  8:00 PM [facility] ROOM 4 [facility]    BP 104/68 (BP Location: Left Arm, Patient Position: Sitting, Cuff Size: Large)   Pulse 68   Resp 16   SpO2 96%   Weight yesterday- 326 lb Last visit weight- 327 lb  [name] was seen at the [facility] today. He reported feeling generally well and denied any episodes of chest pain, SOB, headache, dizziness or orthopnea. He stated he has been compliant with his medications however he is still not weighing daily despite me constantly reminding him of the importance daily. His medications were verified and he filled his pillbox with supervision.   Jacquiline Doe, EMT 04/01/18  ACTION: Home visit completed Next visit planned for 1 week

## 2018-04-07 ENCOUNTER — Ambulatory Visit (INDEPENDENT_AMBULATORY_CARE_PROVIDER_SITE_OTHER): Payer: Medicaid Other | Admitting: Family Medicine

## 2018-04-07 VITALS — BP 110/58 | HR 91 | Temp 98.1°F | Resp 16 | Ht 74.0 in | Wt 327.2 lb

## 2018-04-07 DIAGNOSIS — E119 Type 2 diabetes mellitus without complications: Secondary | ICD-10-CM | POA: Diagnosis not present

## 2018-04-07 DIAGNOSIS — R8290 Unspecified abnormal findings in urine: Secondary | ICD-10-CM

## 2018-04-07 DIAGNOSIS — I509 Heart failure, unspecified: Secondary | ICD-10-CM | POA: Diagnosis not present

## 2018-04-07 DIAGNOSIS — I4891 Unspecified atrial fibrillation: Secondary | ICD-10-CM

## 2018-04-07 LAB — POCT URINALYSIS DIPSTICK
Glucose, UA: POSITIVE — AB
Ketones, UA: 15
Nitrite, UA: NEGATIVE
Protein, UA: POSITIVE — AB
Spec Grav, UA: >=1.03 — AB (ref 1.010–1.025)
Urobilinogen, UA: 1 E.U./dL
pH, UA: 5 (ref 5.0–8.0)

## 2018-04-07 NOTE — Progress Notes (Signed)
  Patient [name]   Progress Note: General Provider: [name], FNP  SUBJECTIVE:   Darius [name] is a 63 y.o. male who  has a past medical history of Atrial fibrillation ([place]), Chronic diastolic heart failure ([place]), Diabetes mellitus, type 2 ([place]), cardiovascular stress test, Morbid obesity ([place]), and Sleep apnea.. Patient presents today for No chief complaint on file. patient presents for follow up on new diabetic medication. Patient states that he is doing well on medication. He is compliant with Wilder Glade and would like to continue. He denies side effects at the present time. Patient did not bring glucometer to this appt.    Review of Systems  Constitutional: Negative.   HENT: Negative.   Eyes: Negative.   Respiratory: Negative.   Cardiovascular: Negative.   Gastrointestinal: Negative.   Genitourinary: Negative.   Musculoskeletal: Negative.   Skin: Negative.   Neurological: Negative.   Psychiatric/Behavioral: Negative.      OBJECTIVE: BP (!) 110/58 Comment: manually  Pulse 91   Temp 98.1 F (36.7 C) (Oral)   Resp 16   Ht 6\' 2"  (1.88 m)   Wt (!) 327 lb 3.2 oz (148.4 kg)   SpO2 93%   BMI 42.01 kg/m   Wt Readings from Last 3 Encounters:  04/07/18 (!) 327 lb 3.2 oz (148.4 kg)  03/25/18 (!) 327 lb (148.3 kg)  03/18/18 (!) 327 lb (148.3 kg)     Physical Exam Vitals signs and nursing note reviewed.  Constitutional:      General: He is not in acute distress.    Appearance: He is well-developed.  HENT:     Head: Normocephalic and atraumatic.  Eyes:     Conjunctiva/sclera: Conjunctivae normal.     Pupils: Pupils are equal, round, and reactive to light.  Neck:     Musculoskeletal: Normal range of motion.  Cardiovascular:     Rate and Rhythm: Normal rate and regular rhythm.     Heart sounds: Normal heart sounds.  Pulmonary:     Effort: Pulmonary effort is normal. No respiratory distress.     Breath sounds: Normal breath  sounds.  Abdominal:     General: Bowel sounds are normal. There is no distension.     Palpations: Abdomen is soft.  Musculoskeletal: Normal range of motion.  Skin:    General: Skin is warm and dry.  Neurological:     Mental Status: He is alert and oriented to person, place, and time.  Psychiatric:        Behavior: Behavior normal.        Thought Content: Thought content normal.     ASSESSMENT/PLAN:   1. Type 2 diabetes mellitus without complication, without long-term current use of insulin (HCC)  Continue with current medications. Bring your glucometer to the next visit.  The patient is asked to make an attempt to improve diet and exercise patterns to aid in medical management of this problem.     Return in about 8 weeks (around 06/02/2018) for DM- a1c.    The patient was given clear instructions to go to ER or return to medical center if symptoms do not improve, worsen or new problems develop. The patient verbalized understanding and agreed with plan of care.   [name]. Nathaneil Canary, FNP-BC [facility] [address] [place], [place] [address] [phone]

## 2018-04-07 NOTE — Patient Instructions (Signed)
Diabetes Mellitus and Standards of Medical Care Managing diabetes (diabetes mellitus) can be complicated. Your diabetes treatment may be managed by a team of health care providers, including:  A physician who specializes in diabetes (endocrinologist).  A nurse practitioner or physician assistant.  Nurses.  A diet and nutrition specialist (registered dietitian).  A certified diabetes educator (CDE).  An exercise specialist.  A pharmacist.  An eye doctor.  A foot specialist (podiatrist).  A dentist.  A primary care provider.  A mental health provider. Your health care providers follow guidelines to help you get the best quality of care. The following schedule is a general guideline for your diabetes management plan. Your health care providers may give you more specific instructions. Physical exams Upon being diagnosed with diabetes mellitus, and each year after that, your health care provider will ask about your medical and family history. He or she will also do a physical exam. Your exam may include:  Measuring your height, weight, and body mass index (BMI).  Checking your blood pressure. This will be done at every routine medical visit. Your target blood pressure may vary depending on your medical conditions, your age, and other factors.  Thyroid gland exam.  Skin exam.  Screening for damage to your nerves (peripheral neuropathy). This may include checking the pulse in your legs and feet and checking the level of sensation in your hands and feet.  A complete foot exam to inspect the structure and skin of your feet, including checking for cuts, bruises, redness, blisters, sores, or other problems.  Screening for blood vessel (vascular) problems, which may include checking the pulse in your legs and feet and checking your temperature. Blood tests Depending on your treatment plan and your personal needs, you may have the following tests done:  HbA1c (hemoglobin A1c). This  test provides information about blood sugar (glucose) control over the previous 2-3 months. It is used to adjust your treatment plan, if needed. This test will be done: ? At least 2 times a year, if you are meeting your treatment goals. ? 4 times a year, if you are not meeting your treatment goals or if treatment goals have changed.  Lipid testing, including total, LDL, and HDL cholesterol and triglyceride levels. ? The goal for LDL is less than 100 mg/dL (5.5 mmol/L). If you are at high risk for complications, the goal is less than 70 mg/dL (3.9 mmol/L). ? The goal for HDL is 40 mg/dL (2.2 mmol/L) or higher for men and 50 mg/dL (2.8 mmol/L) or higher for women. An HDL cholesterol of 60 mg/dL (3.3 mmol/L) or higher gives some protection against heart disease. ? The goal for triglycerides is less than 150 mg/dL (8.3 mmol/L).  Liver function tests.  Kidney function tests.  Thyroid function tests. Dental and eye exams  Visit your dentist two times a year.  If you have type 1 diabetes, your health care provider may recommend an eye exam 3-5 years after you are diagnosed, and then once a year after your first exam. ? For children with type 1 diabetes, a health care provider may recommend an eye exam when your child is age 10 or older and has had diabetes for 3-5 years. After the first exam, your child should get an eye exam once a year.  If you have type 2 diabetes, your health care provider may recommend an eye exam as soon as you are diagnosed, and then once a year after your first exam. Immunizations   The  your first exam.  Immunizations    The yearly flu (influenza) vaccine is recommended for everyone 6 months or older who has diabetes.  The pneumonia (pneumococcal) vaccine is recommended for everyone 2 years or older who has diabetes. If you are 65 or older, you may get the pneumonia vaccine as a series of two separate shots.  The hepatitis B vaccine is recommended for adults shortly after being diagnosed with diabetes.  Adults and children with diabetes should receive all other vaccines  according to age-specific recommendations from the Centers for Disease Control and Prevention (CDC).  Mental and emotional health  Screening for symptoms of eating disorders, anxiety, and depression is recommended at the time of diagnosis and afterward as needed. If your screening shows that you have symptoms (positive screening result), you may need more evaluation and you may work with a mental health care provider.  Treatment plan  Your treatment plan will be reviewed at every medical visit. You and your health care provider will discuss:  How you are taking your medicines, including insulin.  Any side effects you are experiencing.  Your blood glucose target goals.  The frequency of your blood glucose monitoring.  Lifestyle habits, such as activity level as well as tobacco, alcohol, and substance use.  Diabetes self-management education  Your health care provider will assess how well you are monitoring your blood glucose levels and whether you are taking your insulin correctly. He or she may refer you to:  A certified diabetes educator to manage your diabetes throughout your life, starting at diagnosis.  A registered dietitian who can create or review your personal nutrition plan.  An exercise specialist who can discuss your activity level and exercise plan.  Summary  Managing diabetes (diabetes mellitus) can be complicated. Your diabetes treatment may be managed by a team of health care providers.  Your health care providers follow guidelines in order to help you get the best quality of care.  Standards of care including having regular physical exams, blood tests, blood pressure monitoring, immunizations, screening tests, and education about how to manage your diabetes.  Your health care providers may also give you more specific instructions based on your individual health.  This information is not intended to replace advice given to you by your health care provider. Make sure you discuss any questions you have  with your health care provider.  Document Released: 11/24/2008 Document Revised: 10/16/2017 Document Reviewed: 10/26/2015  Elsevier Interactive Patient Education  2019 Elsevier Inc.

## 2018-04-08 ENCOUNTER — Other Ambulatory Visit (HOSPITAL_COMMUNITY): Payer: Self-pay

## 2018-04-08 ENCOUNTER — Encounter: Payer: Self-pay | Admitting: Family Medicine

## 2018-04-08 NOTE — Progress Notes (Signed)
Paramedicine Encounter    Patient ID: Darius [name], male    DOB: January 05, 1956, 63 y.o.   MRN: [id number]   Patient Care Team: [name], [place] as PCP - General (Family Medicine) Constance Haw, MD as PCP - Electrophysiology (Cardiology) [name], MD as Consulting Physician (Cardiology) Jorge Ny, LCSW as Social Worker (Licensed Clinical Social Worker)  Patient Active Problem List   Diagnosis Date Noted  . Urine leukocytes 05/24/2017  . Atrial fibrillation with RVR ([place]) 04/17/2016  . Elevated troponin 04/17/2016  . CHF (congestive heart failure) ([place]) 04/17/2016  . Septic olecranon bursitis of right elbow 09/19/2015  . Lactic acid acidosis   . Dilated cardiomyopathy ([place]) 04/19/2014  . Type 2 diabetes mellitus ([place]) 04/09/2014  . Non compliance with medical treatment and diet 04/09/2014  . Chronic anticoagulation-Eliquis started 04/06/14 04/09/2014  . Abnormal chest CT-LUL nodule- needs f/u May 2016 04/09/2014  . Acute on chronic combined systolic and diastolic heart failure ([place]) 04/05/2014  . OSA - C-pap 03/22/2013  . Persistent atrial fibrillation ([place]) 03/27/2011  . Obesity, Class III, BMI 40-49.9 (morbid obesity) ([place]) 03/27/2011  . URI, acute 03/27/2011    Current Outpatient Medications:  .  amiodarone (PACERONE) 200 MG tablet, Take 1 tablet (200 mg total) by mouth daily., Disp: 30 tablet, Rfl: 2 .  apixaban (ELIQUIS) 5 MG TABS tablet, Take 1 tablet (5 mg total) by mouth 2 (two) times daily., Disp: 60 tablet, Rfl: 5 .  atorvastatin (LIPITOR) 80 MG tablet, TAKE 1 TABLET (80 MG TOTAL) BY MOUTH AT BEDTIME., Disp: 30 tablet, Rfl: 11 .  dapagliflozin propanediol (FARXIGA) 5 MG TABS tablet, Take 5 mg by mouth daily., Disp: 90 tablet, Rfl: 1 .  furosemide (LASIX) 40 MG tablet, TAKE 1 TABLET BY MOUTH ONCE DAILY, Disp: 100 tablet, Rfl: 2 .  metFORMIN (GLUCOPHAGE) 1000 MG tablet, TAKE 1 TABLET BY MOUTH TWICE A DAY WITH FOOD, Disp: 60 tablet, Rfl: 3 .  metoprolol  succinate (TOPROL-XL) 100 MG 24 hr tablet, Take 1 tablet (100 mg total) by mouth 2 (two) times daily. Take with or immediately following a meal., Disp: 180 tablet, Rfl: 1 .  sacubitril-valsartan (ENTRESTO) 97-103 MG, Take 1 tablet by mouth 2 (two) times daily., Disp: 180 tablet, Rfl: 3 .  spironolactone (ALDACTONE) 25 MG tablet, TAKE 1 TABLET BY MOUTH ONCE DAILY, Disp: 30 tablet, Rfl: 5 .  ketoconazole (NIZORAL) 2 % shampoo, Apply 1 application topically 2 (two) times a week., Disp: 120 mL, Rfl: 0 .  nystatin-triamcinolone ointment (MYCOLOG), Apply 1 application topically 2 (two) times daily., Disp: 60 g, Rfl: 1 No Known Allergies    Social History   Socioeconomic History  . Marital status: Single    Spouse name: Not on file  . Number of children: 0  . Years of education: 54  . Highest education level: Not on file  Occupational History  . Occupation: unemployeed  Social Needs  . Financial resource strain: Hard  . Food insecurity:    Worry: Sometimes true    Inability: Sometimes true  . Transportation needs:    Medical: No    Non-medical: No  Tobacco Use  . Smoking status: Never Smoker  . Smokeless tobacco: Never Used  Substance and Sexual Activity  . Alcohol use: Yes    Comment: some  . Drug use: No  . Sexual activity: Never  Lifestyle  . Physical activity:    Days per week: Not on file    Minutes per session: Not  on file  . Stress: Not on file  Relationships  . Social connections:    Talks on phone: Not on file    Gets together: Not on file    Attends religious service: Not on file    Active member of club or organization: Not on file    Attends meetings of clubs or organizations: Not on file    Relationship status: Not on file  . Intimate partner violence:    Fear of current or ex partner: Not on file    Emotionally abused: Not on file    Physically abused: Not on file    Forced sexual activity: Not on file  Other Topics Concern  . Not on file  Social History  Narrative   Admits that he eats poorly and eats a lot at work.      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    Physical Exam Cardiovascular:     Rate and Rhythm: Normal rate and regular rhythm.     Pulses: Normal pulses.  Pulmonary:     Effort: Pulmonary effort is normal.     Breath sounds: Normal breath sounds.  Musculoskeletal: Normal range of motion.     Right lower leg: No edema.     Left lower leg: No edema.  Skin:    General: Skin is warm and dry.     Capillary Refill: Capillary refill takes less than 2 seconds.  Neurological:     Mental Status: He is alert and oriented to person, place, and time.  Psychiatric:        Mood and Affect: Mood normal.         Future Appointments  Date Time Provider [place]  04/15/2018  8:00 PM [facility] ROOM 4 [facility]  06/02/2018  1:40 PM [name], FNP [facility] None    BP 100/68 (BP Location: Left Arm, Patient Position: Sitting, Cuff Size: Large)   Pulse 70   Resp 16   SpO2 95%   Weight yesterday- 327 lb Last visit weight- Did not weigh  Darius [name] was seen at [facility] today and reported feeling well. He denied chest pain, SOB, headache, dizziness or orthopnea. He stated he has been compliant with his medications over the past week but he is still not weighing daily. His medications were verified and he was supervised while he filled his pillbox. I believe that next week will be time for discharge, as he is now able to take his medications on his own and there is no changing his diet or habits.   Darius [name], EMT 04/08/18  ACTION: Home visit completed Next visit planned for 1 week

## 2018-04-09 LAB — URINE CULTURE

## 2018-04-13 ENCOUNTER — Telehealth (HOSPITAL_COMMUNITY): Payer: Self-pay

## 2018-04-13 NOTE — Telephone Encounter (Signed)
I called Mr Darius [name] to schedule an appointment for Thursday. He stated he would be available and agreed to meet with me at 15:30 at [facility].

## 2018-04-15 ENCOUNTER — Other Ambulatory Visit (HOSPITAL_COMMUNITY): Payer: Self-pay

## 2018-04-15 ENCOUNTER — Ambulatory Visit (HOSPITAL_BASED_OUTPATIENT_CLINIC_OR_DEPARTMENT_OTHER): Payer: Medicaid Other | Attending: Cardiology | Admitting: Cardiology

## 2018-04-15 VITALS — Ht 74.0 in | Wt 327.0 lb

## 2018-04-15 DIAGNOSIS — G4733 Obstructive sleep apnea (adult) (pediatric): Secondary | ICD-10-CM

## 2018-04-15 MED FILL — ELIQUIS 5 MG TABLET: 5 | 30 days supply | Qty: 60 | Fill #4 | Status: TO

## 2018-04-15 MED FILL — SPIRONOLACTONE 25 MG TABLET: 25 | 30 days supply | Qty: 30 | Fill #3 | Status: TO

## 2018-04-15 MED FILL — ENTRESTO 97 MG-103 MG TAB: 97-103 | 30 days supply | Qty: 60 | Fill #3 | Status: TO

## 2018-04-15 NOTE — Progress Notes (Signed)
Paramedicine Encounter    Patient ID: Darius [name], male    DOB: 1955-05-17, 63 y.o.   MRN: [id number]   Patient Care Team: Darius [name], [place] as PCP - General (Family Medicine) Darius Haw, MD as PCP - Electrophysiology (Cardiology) Darius Dresser, MD as Consulting Physician (Cardiology)  Patient Active Problem List   Diagnosis Date Noted  . Urine leukocytes 05/24/2017  . Atrial fibrillation with RVR ([place]) 04/17/2016  . Elevated troponin 04/17/2016  . CHF (congestive heart failure) ([place]) 04/17/2016  . Septic olecranon bursitis of right elbow 09/19/2015  . Lactic acid acidosis   . Dilated cardiomyopathy ([place]) 04/19/2014  . Type 2 diabetes mellitus ([place]) 04/09/2014  . Non compliance with medical treatment and diet 04/09/2014  . Chronic anticoagulation-Eliquis started 04/06/14 04/09/2014  . Abnormal chest CT-LUL nodule- needs f/u May 2016 04/09/2014  . Acute on chronic combined systolic and diastolic heart failure ([place]) 04/05/2014  . OSA - C-pap 03/22/2013  . Persistent atrial fibrillation ([place]) 03/27/2011  . Obesity, Class III, BMI 40-49.9 (morbid obesity) ([place]) 03/27/2011  . URI, acute 03/27/2011    Current Outpatient Medications:  .  amiodarone (PACERONE) 200 MG tablet, Take 1 tablet (200 mg total) by mouth daily., Disp: 30 tablet, Rfl: 2 .  apixaban (ELIQUIS) 5 MG TABS tablet, Take 1 tablet (5 mg total) by mouth 2 (two) times daily., Disp: 60 tablet, Rfl: 5 .  atorvastatin (LIPITOR) 80 MG tablet, TAKE 1 TABLET (80 MG TOTAL) BY MOUTH AT BEDTIME., Disp: 30 tablet, Rfl: 11 .  dapagliflozin propanediol (FARXIGA) 5 MG TABS tablet, Take 5 mg by mouth daily., Disp: 90 tablet, Rfl: 1 .  furosemide (LASIX) 40 MG tablet, TAKE 1 TABLET BY MOUTH ONCE DAILY, Disp: 100 tablet, Rfl: 2 .  metFORMIN (GLUCOPHAGE) 1000 MG tablet, TAKE 1 TABLET BY MOUTH TWICE A DAY WITH FOOD, Disp: 60 tablet, Rfl: 3 .  metoprolol succinate (TOPROL-XL) 100 MG 24 hr tablet, Take 1 tablet (100 mg total)  by mouth 2 (two) times daily. Take with or immediately following a meal., Disp: 180 tablet, Rfl: 1 .  sacubitril-valsartan (ENTRESTO) 97-103 MG, Take 1 tablet by mouth 2 (two) times daily., Disp: 180 tablet, Rfl: 3 .  spironolactone (ALDACTONE) 25 MG tablet, TAKE 1 TABLET BY MOUTH ONCE DAILY, Disp: 30 tablet, Rfl: 5 .  ketoconazole (NIZORAL) 2 % shampoo, Apply 1 application topically 2 (two) times a week., Disp: 120 mL, Rfl: 0 .  nystatin-triamcinolone ointment (MYCOLOG), Apply 1 application topically 2 (two) times daily., Disp: 60 g, Rfl: 1 No Known Allergies    Social History   Socioeconomic History  . Marital status: Single    Spouse name: Not on file  . Number of children: 0  . Years of education: 70  . Highest education level: Not on file  Occupational History  . Occupation: unemployeed  Social Needs  . Financial resource strain: Hard  . Food insecurity:    Worry: Sometimes true    Inability: Sometimes true  . Transportation needs:    Medical: No    Non-medical: No  Tobacco Use  . Smoking status: Never Smoker  . Smokeless tobacco: Never Used  Substance and Sexual Activity  . Alcohol use: Yes    Comment: some  . Drug use: No  . Sexual activity: Never  Lifestyle  . Physical activity:    Days per week: Not on file    Minutes per session: Not on file  . Stress: Not on file  Relationships  .  Social connections:    Talks on phone: Not on file    Gets together: Not on file    Attends religious service: Not on file    Active member of club or organization: Not on file    Attends meetings of clubs or organizations: Not on file    Relationship status: Not on file  . Intimate partner violence:    Fear of current or ex partner: Not on file    Emotionally abused: Not on file    Physically abused: Not on file    Forced sexual activity: Not on file  Other Topics Concern  . Not on file  Social History Narrative   Admits that he eats poorly and eats a lot at work.       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    Physical Exam Cardiovascular:     Rate and Rhythm: Normal rate and regular rhythm.     Pulses: Normal pulses.  Pulmonary:     Effort: Pulmonary effort is normal.     Breath sounds: Normal breath sounds.  Abdominal:     Palpations: Abdomen is soft.  Musculoskeletal: Normal range of motion.     Right lower leg: No edema.     Left lower leg: No edema.  Skin:    General: Skin is warm and dry.     Capillary Refill: Capillary refill takes less than 2 seconds.  Neurological:     Mental Status: He is alert and oriented to person, place, and time.  Psychiatric:        Mood and Affect: Mood normal.         Future Appointments  Date Time Provider [place]  04/15/2018  8:00 PM [facility] ROOM 4 [facility]  06/02/2018  1:40 PM Darius Boast, FNP [facility] None    BP 130/90 (BP Location: Left Arm, Patient Position: Sitting, Cuff Size: Large)   Pulse 86   Resp 16   Wt (!) 327 lb (148.3 kg)   SpO2 94%   BMI 41.98 kg/m   Weight yesterday- 327 lb Last visit weight- Did not weigh  Darius [name] was seen at the [facility] today and reported feeling well. He denied chest pain, SOB, headache, dizziness or orthopnea. He reported he has been compliant with his medications over the week and he has been weighing daily. He ran out of atorvastatin and Entresto while filling his pillbox so they were called into the pharmacy and will be available to pick up tomorrow. Also ordered were spironolactone and Eliquis. Hal medications were verified and he was supervised while filling his pillbox. At this point he is able to manage his medications and I do not believe he can benefit from paramedicine anymore. We have been discussing this for the past several weeks and I believe it is now time for discharge. I advised him that if he needs assistance with his heart failure moving forward, he should contact the HF clinic. He was  understanding and agreeable.   Darius [name], EMT 04/15/18  ACTION: Home visit completed Next visit planned for 1 week

## 2018-04-16 MED FILL — ATORVASTATIN 80 MG TABLET: 80 | 30 days supply | Qty: 30 | Fill #2 | Status: TO

## 2018-04-18 NOTE — Procedures (Signed)
   Patient Name: Darius [name], Sec[place] Date: 04/15/2018 Gender: Male D.O.B: 03-06-1955 Age (years): 18 Referring Provider: Fransico Him MD, ABSM Height (inches): 74 Interpreting Physician: Fransico Him MD, ABSM Weight (lbs): 327 RPSGT: Laren Everts BMI: 42 MRN: [id number] Neck Size: 20.00  CLINICAL INFORMATION  The patient is referred for a CPAP titration to treat sleep apnea  SLEEP STUDY TECHNIQUE  As per the AASM Manual for the Scoring of Sleep and Associated Events v2.3 (April 2016) with a hypopnea requiring 4% desaturations. The channels recorded and monitored were frontal, central and occipital EEG, electrooculogram (EOG), submentalis EMG (chin), nasal and oral airflow, thoracic and abdominal wall motion, anterior tibialis EMG, snore microphone, electrocardiogram, and pulse oximetry. Continuous positive airway pressure (CPAP) was initiated at the beginning of the study and titrated to treat sleep-disordered breathing.  MEDICATIONS  Medications self-administered by patient taken the night of the study : N/A  TECHNICIAN COMMENTS  Comments added by technician: NONE Comments added by scorer: N/A  RESPIRATORY PARAMETERS  Optimal PAP Pressure (cm):10 AHI at Optimal Pressure (/hr):0.0 Overall Minimal O2 (%):83.0 Supine % at Optimal Pressure (%):100 Minimal O2 at Optimal Pressure (%):88.0   SLEEP ARCHITECTURE  The study was initiated at 10:47:21 PM and ended at 5:08:11 AM. Sleep onset time was 163.9 minutes and the sleep efficiency was 51.7%. The total sleep time was 197 minutes. The patient spent 4.1% of the night in stage N1 sleep, 76.6% in stage N2 sleep, 0.0% in stage N3 and 19.3% in REM.Stage REM latency was 38.0 minutes Wake after sleep onset was 20.0. Alpha intrusion was absent. Supine sleep was 83.50%.  CARDIAC DATA  The 2 lead EKG demonstrated sinus rhythm. The mean heart rate was 62.9 beats per minute. Other EKG findings include: None.  LEG MOVEMENT DATA  The  total Periodic Limb Movements of Sleep (PLMS) were 0. The PLMS index was 0.0. A PLMS index of <15 is considered normal in adults.  IMPRESSIONS  - The optimal PAP pressure was 10 cm of water. - Central sleep apnea was not noted during this titration (CAI = 0.0/h). - Moderate oxygen desaturations were observed during this titration (min O2 = 83.0%). - The patient snored with soft snoring volume during this titration study. - No cardiac abnormalities were observed during this study. - Clinically significant periodic limb movements were not noted during this study. Arousals associated with PLMs were rare.  DIAGNOSIS  - Obstructive Sleep Apnea (327.23 [G47.33 ICD-10])  RECOMMENDATIONS  - Trial of CPAP therapy on 10 cm H2O with a Medium size Resmed Full Face Mask AirFit F20 mask and heated humidification. - Avoid alcohol, sedatives and other CNS depressants that may worsen sleep apnea and disrupt normal sleep architecture. - Sleep hygiene should be reviewed to assess factors that may improve sleep quality. - Weight management and regular exercise should be initiated or continued. - Return to Sleep Center for re-evaluation after 10 weeks of therapy  [Electronically signed] 04/18/2018 08:03 PM  Fransico Him MD, ABSM Diplomate, American Board of Sleep Medicine

## 2018-04-20 ENCOUNTER — Telehealth: Payer: Self-pay | Admitting: *Deleted

## 2018-04-20 NOTE — Telephone Encounter (Signed)
Called results lmtcb. 

## 2018-04-21 NOTE — Telephone Encounter (Signed)
Called results lmtcb. 

## 2018-04-22 ENCOUNTER — Encounter: Payer: Self-pay | Admitting: *Deleted

## 2018-04-22 NOTE — Telephone Encounter (Signed)
Called results lmtcb. 

## 2018-04-22 NOTE — Telephone Encounter (Signed)
No contact letter sent today.

## 2018-04-29 NOTE — Telephone Encounter (Signed)
Return call: Informed patient of sleep study results and patient understanding was verbalized. Upon patient request DME selection is [facility]. Patient understands he will be contacted by [facility] to set up his cpap. Patient understands to call if [facility] does not contact him with new setup in a timely manner. Patient understands they will be called once confirmation has been received from [facility] that they have received their new machine to schedule 10 week follow up appointment.  [facility] notified of new cpap order  Please add to airview Patient was grateful for the call and thanked me.

## 2018-05-06 ENCOUNTER — Other Ambulatory Visit (HOSPITAL_COMMUNITY): Payer: Self-pay | Admitting: Student

## 2018-05-06 MED FILL — ATORVASTATIN 80 MG TABLET: 80 | 30 days supply | Qty: 30 | Fill #0

## 2018-05-06 MED FILL — FARXIGA 5 MG TABLET: 5 | 30 days supply | Qty: 30 | Fill #0

## 2018-05-06 MED FILL — METOPROLOL SUCCINATE ER 100: 100 | 30 days supply | Qty: 60 | Fill #0

## 2018-05-06 MED FILL — metFORMIN HCL 1000 MG TABS: 1000 | 30 days supply | Qty: 60 | Fill #0 | Status: TO

## 2018-05-06 MED FILL — AMIODARONE HCL 200 MG TAB: 200 | 30 days supply | Qty: 30 | Fill #0

## 2018-05-09 IMAGING — CR DG ELBOW COMPLETE 3+V*R*
4 series · 4 of 4 positions shown · non-contrast
Comparison: None.

CLINICAL DATA: Acute onset of right elbow pain and swelling, after
hitting elbow on deck. Initial encounter.

EXAM:
RIGHT ELBOW - COMPLETE 3+ VIEW

[elbow ap]
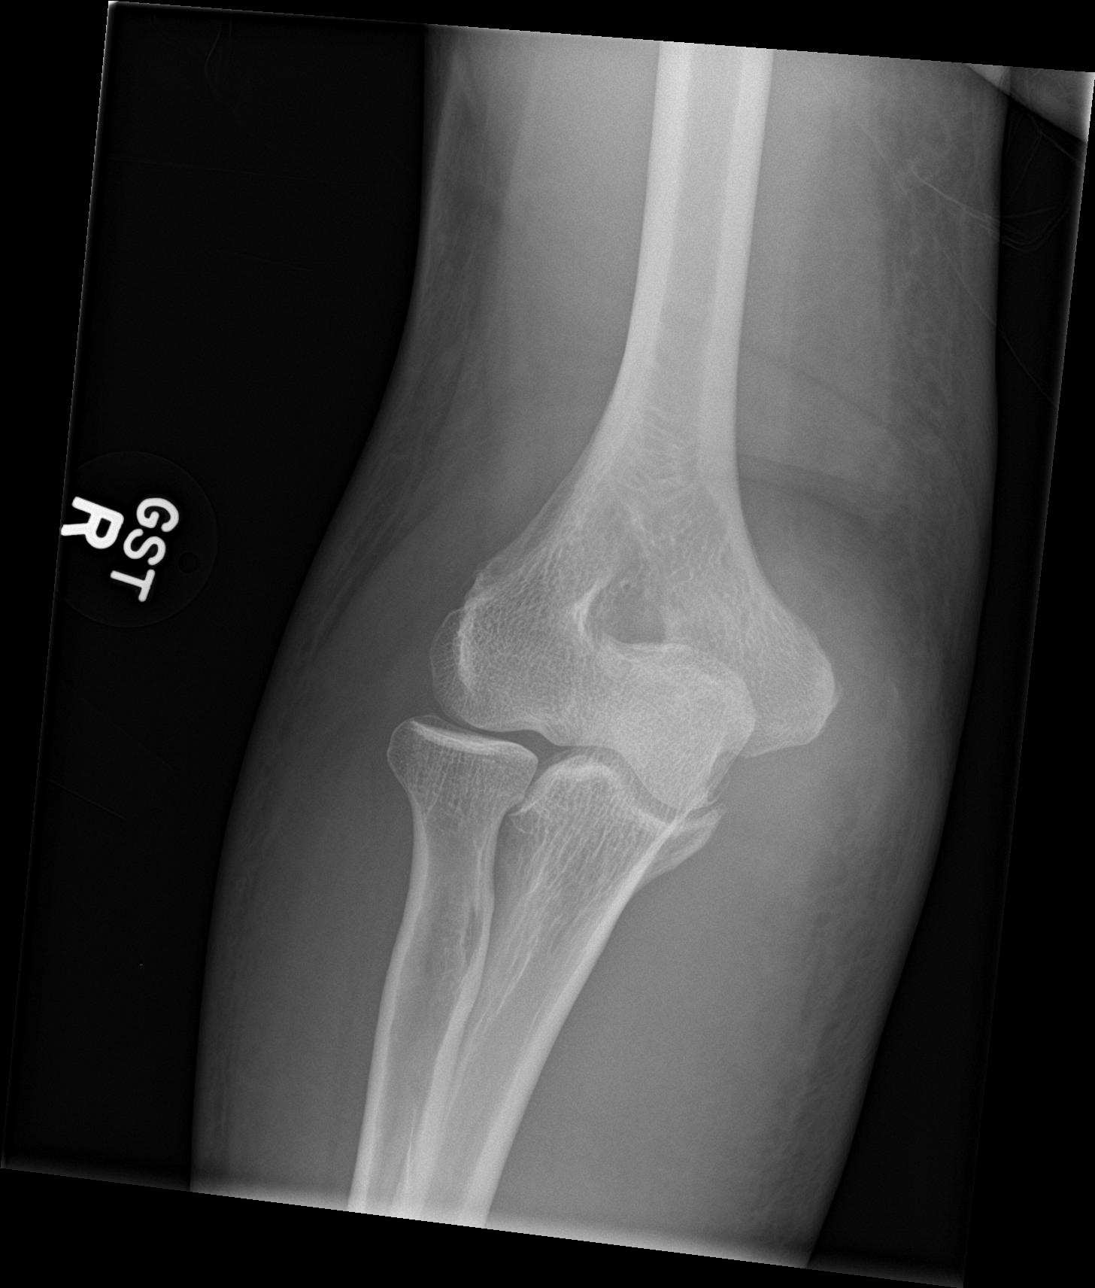

[elbow obl (1 of 2)]
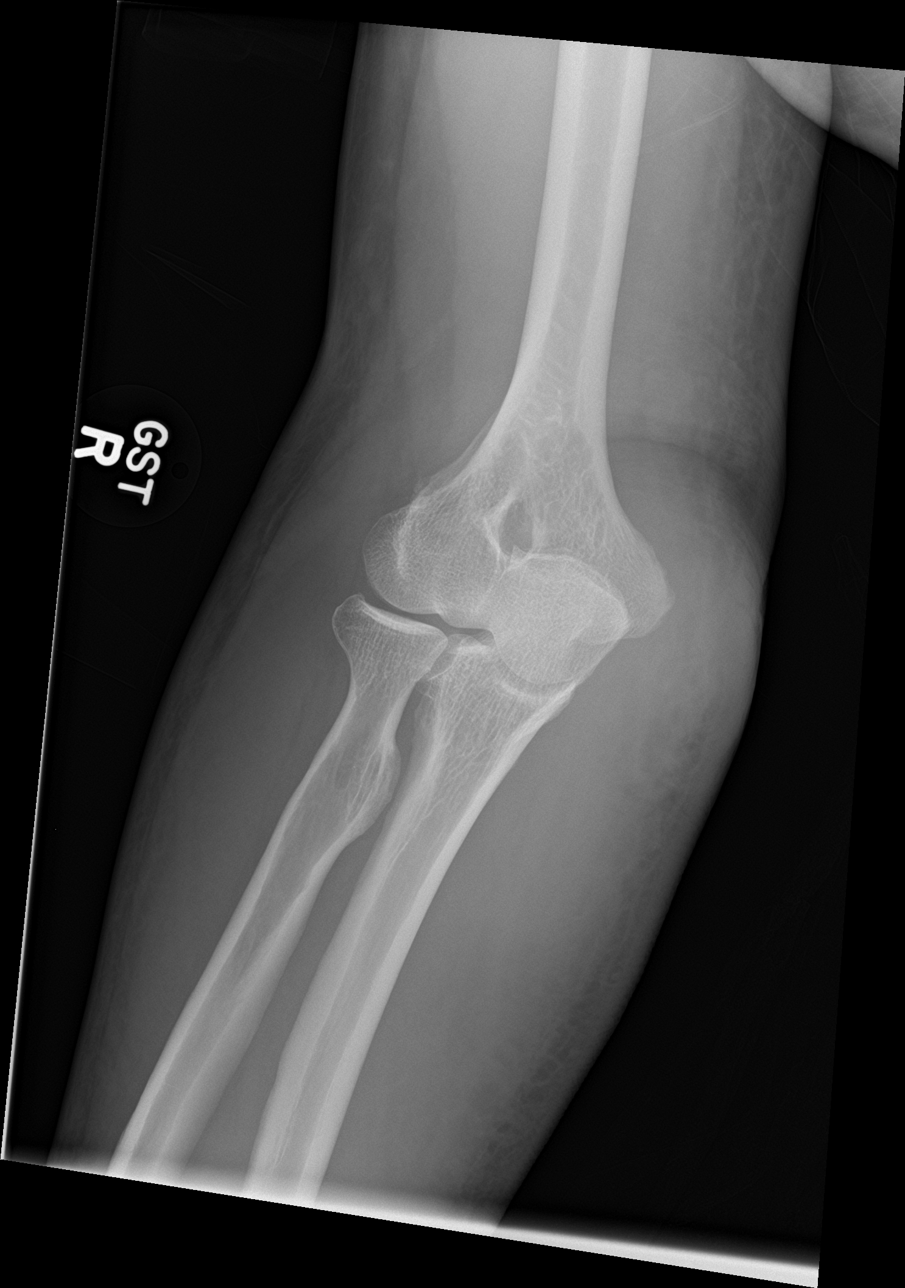

[elbow obl (2 of 2)]
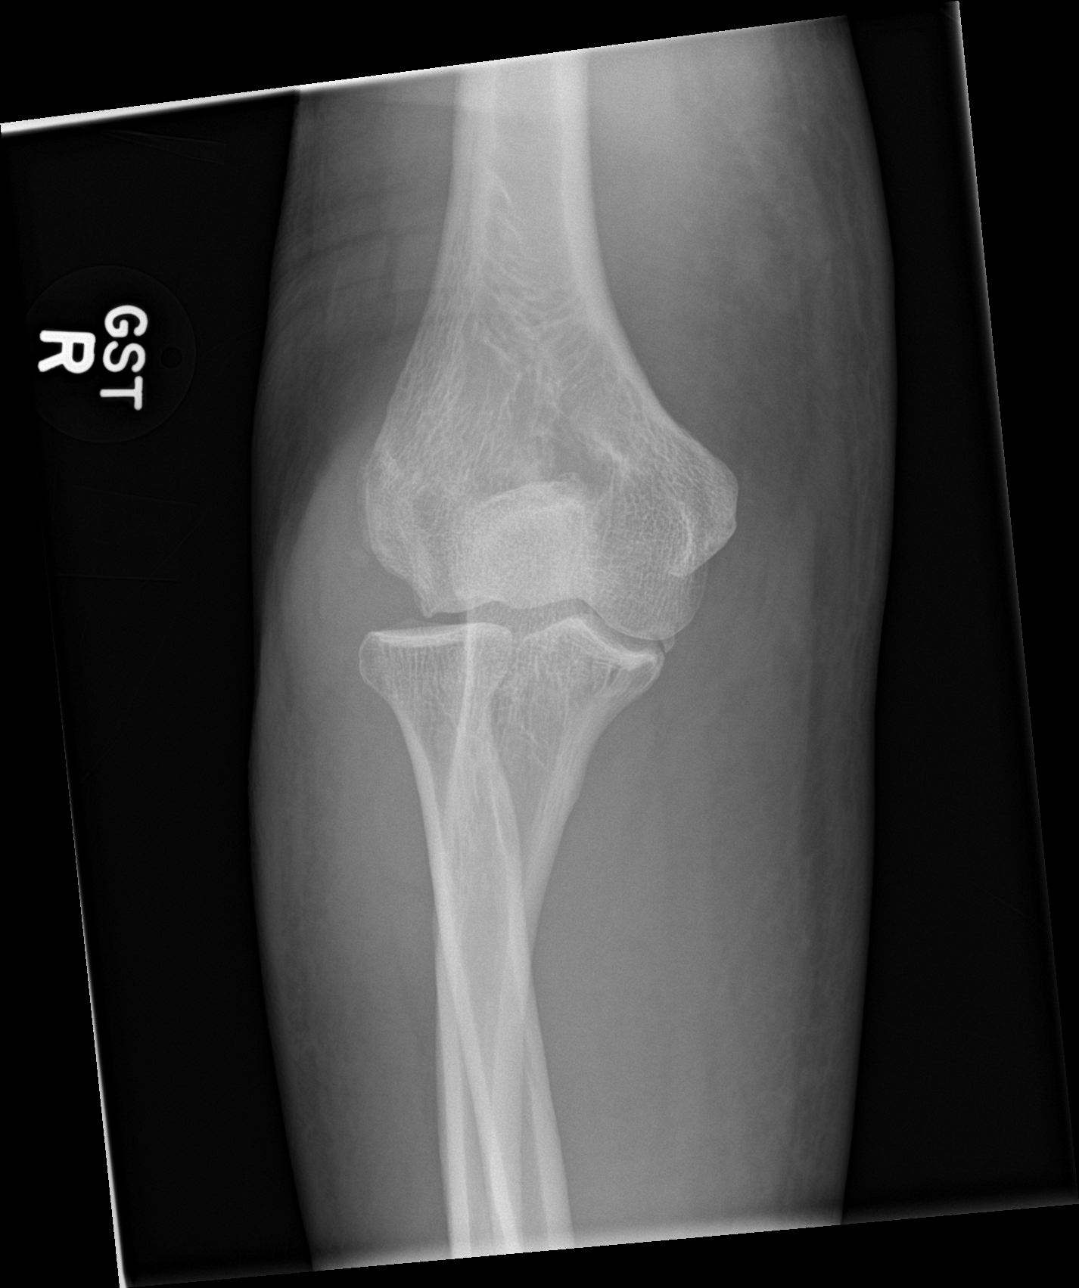

[elbow lat]
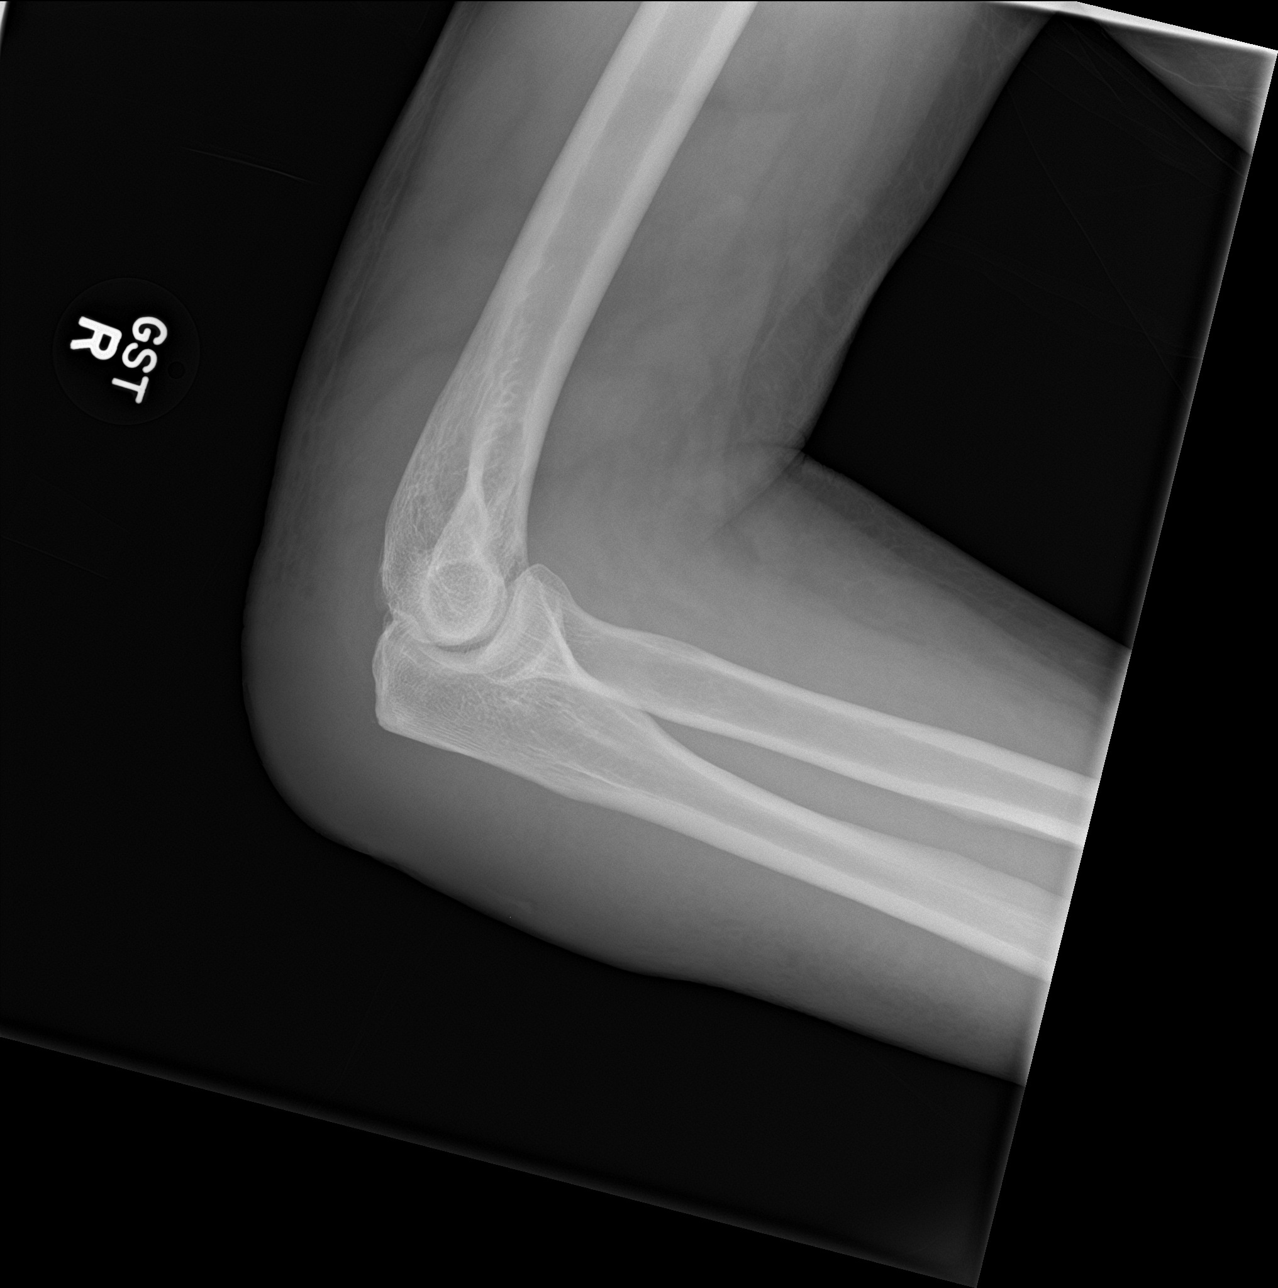

[4 of 4 positions shown; findings below may reference images not displayed]

FINDINGS: There is no evidence of fracture or dislocation. The visualized
joint spaces are preserved. No significant joint effusion is
identified. Diffuse soft tissue swelling is noted about the elbow.
IMPRESSION: No evidence of fracture or dislocation.

## 2018-06-02 ENCOUNTER — Ambulatory Visit: Payer: Medicaid Other | Admitting: Family Medicine

## 2018-06-03 MED FILL — ATORVASTATIN 80 MG TABLET: 80 | 30 days supply | Qty: 30 | Fill #1 | Status: TO

## 2018-06-03 MED FILL — SPIRONOLACTONE 25 MG TABS: 25 | 30 days supply | Qty: 30 | Fill #0 | Status: TO

## 2018-06-03 MED FILL — ELIQUIS 5 MG TABLET: 5 | 30 days supply | Qty: 60 | Fill #0

## 2018-06-03 MED FILL — ENTRESTO 97 MG-103 MG TAB: 97-103 | 30 days supply | Qty: 60 | Fill #0 | Status: TO

## 2018-06-03 MED FILL — FARXIGA 5 MG TABLET: 5 | 30 days supply | Qty: 30 | Fill #1 | Status: TO

## 2018-06-03 MED FILL — METOPROLOL SUCCINATE ER 100: 100 | 30 days supply | Qty: 60 | Fill #1 | Status: TO

## 2018-06-03 MED FILL — AMIODARONE HCL 200 MG TABS: 200 | 30 days supply | Qty: 30 | Fill #1 | Status: TO

## 2018-07-14 ENCOUNTER — Other Ambulatory Visit (HOSPITAL_COMMUNITY): Payer: Self-pay | Admitting: Cardiology

## 2018-07-14 MED FILL — ENTRESTO 97 MG-103 MG TAB: 97-103 | 30 days supply | Qty: 60 | Fill #0

## 2018-07-14 MED FILL — ELIQUIS 5 MG TABLET: 5 | 30 days supply | Qty: 60 | Fill #0

## 2018-07-14 MED FILL — ATORVASTATIN 80 MG TABLET: 80 | 30 days supply | Qty: 30 | Fill #0

## 2018-07-14 MED FILL — FARXIGA 5 MG TABLET: 5 | 30 days supply | Qty: 30 | Fill #0

## 2018-07-14 MED FILL — metFORMIN HCL 1000 MG TABS: 1000 | 30 days supply | Qty: 60 | Fill #0

## 2018-07-14 MED FILL — SPIRONOLACTONE 25 MG TABLET: 25 | 30 days supply | Qty: 30 | Fill #0

## 2018-07-14 MED FILL — AMIODARONE HCL 200 MG TAB: 200 | 30 days supply | Qty: 30 | Fill #0

## 2018-07-14 MED FILL — METOPROLOL SUCCINATE ER 100: 100 | 30 days supply | Qty: 60 | Fill #0

## 2018-07-15 ENCOUNTER — Telehealth: Payer: Self-pay

## 2018-07-15 NOTE — Telephone Encounter (Signed)
Called to do COVID Screening for appointment tomorrow. No answer. Left a message to call back. Thanks! 

## 2018-07-16 ENCOUNTER — Ambulatory Visit (INDEPENDENT_AMBULATORY_CARE_PROVIDER_SITE_OTHER): Payer: Medicaid Other | Admitting: Family Medicine

## 2018-07-16 ENCOUNTER — Encounter: Payer: Self-pay | Admitting: Family Medicine

## 2018-07-16 ENCOUNTER — Other Ambulatory Visit: Payer: Self-pay

## 2018-07-16 VITALS — BP 100/74 | HR 97 | Temp 98.7°F | Resp 18 | Ht 74.0 in | Wt 316.0 lb

## 2018-07-16 DIAGNOSIS — I10 Essential (primary) hypertension: Secondary | ICD-10-CM

## 2018-07-16 DIAGNOSIS — E1100 Type 2 diabetes mellitus with hyperosmolarity without nonketotic hyperglycemic-hyperosmolar coma (NKHHC): Secondary | ICD-10-CM | POA: Diagnosis not present

## 2018-07-16 DIAGNOSIS — I509 Heart failure, unspecified: Secondary | ICD-10-CM

## 2018-07-16 DIAGNOSIS — Z1159 Encounter for screening for other viral diseases: Secondary | ICD-10-CM

## 2018-07-16 DIAGNOSIS — G4733 Obstructive sleep apnea (adult) (pediatric): Secondary | ICD-10-CM

## 2018-07-16 DIAGNOSIS — Z1211 Encounter for screening for malignant neoplasm of colon: Secondary | ICD-10-CM

## 2018-07-16 LAB — POCT URINALYSIS DIPSTICK
Bilirubin, UA: NEGATIVE
Glucose, UA: POSITIVE — AB
Ketones, UA: NEGATIVE
Nitrite, UA: NEGATIVE
Protein, UA: POSITIVE — AB
Spec Grav, UA: 1.02 (ref 1.010–1.025)
Urobilinogen, UA: 1 E.U./dL
pH, UA: 5 (ref 5.0–8.0)

## 2018-07-16 LAB — POCT GLYCOSYLATED HEMOGLOBIN (HGB A1C): Hemoglobin A1C: 7.6 % — AB (ref 4.0–5.6)

## 2018-07-16 NOTE — Progress Notes (Signed)
Patient [name]   Progress Note: General Provider: [name], FNP  SUBJECTIVE:   Darius [name] is a 63 y.o. male who  has a past medical history of Atrial fibrillation ([place]), Chronic diastolic heart failure ([place]), Diabetes mellitus, type 2 ([place]), cardiovascular stress test, Morbid obesity ([place]), and Sleep apnea.. Patient presents today for Diabetes  Since the last visit, patient has been seen by [facility] and had a sleep study. Has not gotten his mask fitting and plans to do that this month.  He reports compliance with his medications. Denies side effects of medications at the present time. He did not bring his glucometer with him today and does not report checking his blood glucose on a regular basis. He has lost 11 pounds since his last visit. He has not had a colonoscopy or recent eye exam.  Review of Systems  Constitutional: Negative.   HENT: Negative.   Eyes: Negative.   Respiratory: Negative.   Cardiovascular: Negative.   Gastrointestinal: Negative.   Genitourinary: Negative.   Musculoskeletal: Negative.   Skin: Negative.   Neurological: Negative.   Psychiatric/Behavioral: Negative.      OBJECTIVE: BP 100/74 (BP Location: Right Arm, Patient Position: Sitting, Cuff Size: Large)   Pulse 97   Temp 98.7 F (37.1 C) (Oral)   Resp 18   Ht 6\' 2"  (1.88 m)   Wt (!) 316 lb (143.3 kg)   SpO2 95%   BMI 40.57 kg/m   Wt Readings from Last 3 Encounters:  07/16/18 (!) 316 lb (143.3 kg)  04/15/18 (!) 327 lb (148.3 kg)  04/15/18 (!) 327 lb (148.3 kg)     Physical Exam Vitals signs and nursing note reviewed.  Constitutional:      General: He is not in acute distress.    Appearance: Normal appearance.  HENT:     Head: Normocephalic and atraumatic.  Eyes:     Extraocular Movements: Extraocular movements intact.     Conjunctiva/sclera: Conjunctivae normal.     Pupils: Pupils are equal, round, and reactive to light.   Cardiovascular:     Rate and Rhythm: Normal rate and regular rhythm.     Heart sounds: No murmur.  Pulmonary:     Effort: Pulmonary effort is normal.     Breath sounds: Normal breath sounds.  Musculoskeletal: Normal range of motion.  Skin:    General: Skin is warm and dry.  Neurological:     Mental Status: He is alert and oriented to person, place, and time.  Psychiatric:        Mood and Affect: Mood normal.        Behavior: Behavior normal.        Thought Content: Thought content normal.        Judgment: Judgment normal.    Diabetic Foot Exam - Simple   Simple Foot Form Diabetic Foot exam was performed with the following findings:  Yes 07/16/2018  2:06 PM  Visual Inspection See comments:  Yes Sensation Testing Intact to touch and monofilament testing bilaterally:  Yes Pulse Check Posterior Tibialis and Dorsalis pulse intact bilaterally:  Yes Comments Patient with several thick and brittle toe nails. Calloused, dry skin with debris.       ASSESSMENT/PLAN:   1. Type 2 diabetes mellitus with hyperosmolarity without coma, without long-term current use of insulin ([facility]) - Urinalysis Dipstick - HgB A1c- 7.6 - Ambulatory referral to Ophthalmology - Ambulatory referral to Podiatry - HM Diabetes Foot Exam Praised  for weight loss and encouraged to continue.  2. Congestive heart failure, unspecified HF chronicity, unspecified heart failure type ([place])  3. OSA - C-pap Patient to contact sleep clinic for mask fitting.  4. Essential hypertension No medication changes warranted at the present time.   5. Need for hepatitis C screening test Patient declined due to cost.   6. Screen for colon cancer Ordered - Cologuard     Return in about 3 months (around 10/16/2018) for A1C DM2.    The patient was given clear instructions to go to ER or return to medical center if symptoms do not improve, worsen or new problems develop. The patient verbalized understanding and agreed with  plan of care.   [name]. Nathaneil Canary, FNP-BC Patient [facility] [address] [place], [place] [address] [phone]

## 2018-07-22 ENCOUNTER — Telehealth: Payer: Self-pay | Admitting: Cardiology

## 2018-07-22 DIAGNOSIS — G4733 Obstructive sleep apnea (adult) (pediatric): Secondary | ICD-10-CM

## 2018-07-22 NOTE — Telephone Encounter (Signed)
New Message     Darius [name] is calling from [place] and she says the  Insurance is requring office notes to be sent stating the patient is in need of his CPAP They are needing a new machine and she said Dr Heron Nay sent the order  They are needing office notes faxed  Fax [phone]

## 2018-07-28 ENCOUNTER — Telehealth: Payer: Self-pay

## 2018-07-28 NOTE — Telephone Encounter (Signed)
Spoke with patient, and provided numbers and names for referrals.

## 2018-07-28 NOTE — Telephone Encounter (Signed)
Go ahead and order a PSG for OSA

## 2018-07-28 NOTE — Telephone Encounter (Signed)
This patient needed current office notes to get his cpap and the last office visit was too old on 12/17/17. [facility] says the patient needs a new sleep study and a Bipap titration. YOUR 12/28/18 NOTE:  1.  OSA -   He has not been compliant with his CPAP device.  He was followed by Dr. Danton Sewer with pulmonary for a brief period of time but then has not used his device in 2 years.  He knows he needs to get back on CPAP.  Since he has not used his device in over 2 years and review of prior office notes from Dr. Gwenette Greet was having a hard time tolerating the high pressures he needed on CPAP.  He may need a BiPAP device.  Please advise

## 2018-07-30 NOTE — Telephone Encounter (Signed)
Per dpr lmtcb for patient on sisters phone.

## 2018-08-19 MED FILL — AMIODARONE HCL 200 MG TAB: 200 | 30 days supply | Qty: 30 | Fill #1

## 2018-08-19 MED FILL — FARXIGA 5 MG TABLET: 5 | 30 days supply | Qty: 30 | Fill #1

## 2018-08-19 MED FILL — ENTRESTO 97 MG-103 MG TAB: 97-103 | 30 days supply | Qty: 60 | Fill #1

## 2018-08-19 MED FILL — ATORVASTATIN 80 MG TABLET: 80 | 30 days supply | Qty: 30 | Fill #1

## 2018-09-13 ENCOUNTER — Other Ambulatory Visit (HOSPITAL_COMMUNITY): Payer: Self-pay | Admitting: Student

## 2018-09-13 ENCOUNTER — Other Ambulatory Visit (HOSPITAL_COMMUNITY): Payer: Self-pay | Admitting: Cardiology

## 2018-09-13 ENCOUNTER — Other Ambulatory Visit: Payer: Self-pay | Admitting: Family Medicine

## 2018-09-13 DIAGNOSIS — E1100 Type 2 diabetes mellitus with hyperosmolarity without nonketotic hyperglycemic-hyperosmolar coma (NKHHC): Secondary | ICD-10-CM

## 2018-09-13 MED FILL — ATORVASTATIN 80 MG TABLET: 80 | 30 days supply | Qty: 30 | Fill #2

## 2018-09-13 MED FILL — ENTRESTO 97 MG-103 MG TAB: 97-103 | 30 days supply | Qty: 60 | Fill #2

## 2018-09-13 MED FILL — SPIRONOLACTONE 25 MG TABLET: 25 | 30 days supply | Qty: 30 | Fill #0

## 2018-09-13 MED FILL — AMIODARONE HCL 200 MG TAB: 200 | 30 days supply | Qty: 30 | Fill #2

## 2018-09-13 MED FILL — ELIQUIS 5 MG TABLET: 5 | 30 days supply | Qty: 60 | Fill #0

## 2018-09-13 MED FILL — METOPROLOL SUCCINATE ER 100: 100 | 30 days supply | Qty: 60 | Fill #1

## 2018-09-29 NOTE — Addendum Note (Signed)
Addended by: Freada Bergeron on: 09/29/2018 12:22 PM   Modules accepted: Orders

## 2018-09-29 NOTE — Telephone Encounter (Signed)
Per sleep lab place order split night. Order is in I weill notify patient.

## 2018-10-11 MED FILL — ELIQUIS 5 MG TABLET: 5 | 30 days supply | Qty: 60 | Fill #1

## 2018-10-11 MED FILL — SPIRONOLACTONE 25 MG TABLET: 25 | 30 days supply | Qty: 30 | Fill #1

## 2018-10-11 MED FILL — ENTRESTO 97 MG-103 MG TAB: 97-103 | 30 days supply | Qty: 60 | Fill #3

## 2018-10-11 MED FILL — FARXIGA 5 MG TABLET: 5 | 90 days supply | Qty: 90 | Fill #0

## 2018-10-11 MED FILL — AMIODARONE HCL 200 MG TAB: 200 | 30 days supply | Qty: 30 | Fill #3

## 2018-10-11 MED FILL — ATORVASTATIN 80 MG TABLET: 80 | 30 days supply | Qty: 30 | Fill #3

## 2018-10-20 ENCOUNTER — Encounter: Payer: Self-pay | Admitting: Family Medicine

## 2018-10-20 ENCOUNTER — Encounter (HOSPITAL_COMMUNITY): Payer: Self-pay

## 2018-10-20 ENCOUNTER — Encounter (HOSPITAL_COMMUNITY): Payer: Self-pay | Admitting: *Deleted

## 2018-10-20 ENCOUNTER — Ambulatory Visit (INDEPENDENT_AMBULATORY_CARE_PROVIDER_SITE_OTHER): Payer: Medicaid Other | Admitting: Family Medicine

## 2018-10-20 ENCOUNTER — Other Ambulatory Visit: Payer: Self-pay

## 2018-10-20 VITALS — BP 114/56 | HR 65 | Temp 98.3°F | Resp 18 | Ht 74.0 in | Wt 310.0 lb

## 2018-10-20 DIAGNOSIS — E1100 Type 2 diabetes mellitus with hyperosmolarity without nonketotic hyperglycemic-hyperosmolar coma (NKHHC): Secondary | ICD-10-CM

## 2018-10-20 DIAGNOSIS — K5900 Constipation, unspecified: Secondary | ICD-10-CM

## 2018-10-20 DIAGNOSIS — Z23 Encounter for immunization: Secondary | ICD-10-CM

## 2018-10-20 LAB — POCT URINALYSIS DIPSTICK
Bilirubin, UA: NEGATIVE
Glucose, UA: POSITIVE — AB
Ketones, UA: NEGATIVE
Leukocytes, UA: NEGATIVE
Nitrite, UA: NEGATIVE
Protein, UA: NEGATIVE
Spec Grav, UA: 1.015 (ref 1.010–1.025)
Urobilinogen, UA: 0.2 E.U./dL
pH, UA: 5 (ref 5.0–8.0)

## 2018-10-20 LAB — POCT GLYCOSYLATED HEMOGLOBIN (HGB A1C): Hemoglobin A1C: 6.6 % — AB (ref 4.0–5.6)

## 2018-10-20 MED ORDER — DOCUSATE SODIUM 100 MG PO CAPS: 100.0000 mg | ORAL_CAPSULE | Freq: Two times a day (BID) | ORAL | 0 refills | Status: DC

## 2018-10-20 NOTE — Progress Notes (Signed)
Patient Darius [name]   Progress Note: General Provider: [name], FNP  SUBJECTIVE:   Darius [name] is a 63 y.o. male who  has a past medical history of Atrial fibrillation ([place]), Chronic diastolic heart failure ([place]), Diabetes mellitus, type 2 ([place]), cardiovascular stress test, Morbid obesity ([place]), and Sleep apnea.. Patient presents today for Diabetes and Constipation  Diabetes He presents for his follow-up diabetic visit. He has type 2 diabetes mellitus. His disease course has been stable. There are no hypoglycemic associated symptoms. There are no diabetic associated symptoms. There are no hypoglycemic complications. Diabetic complications include peripheral neuropathy. Risk factors for coronary artery disease include dyslipidemia, diabetes mellitus, male sex, obesity and sedentary lifestyle. Current diabetic treatment includes oral agent (dual therapy). He is compliant with treatment most of the time. He is following a generally unhealthy diet. He has not had a previous visit with a dietitian. He rarely participates in exercise. Home blood sugar record trend: did not bring glucometer. An ACE inhibitor/angiotensin II receptor blocker is being taken. Eye exam is current.  Constipation This is a new problem. The current episode started in the past 7 days. The problem has been gradually worsening since onset. His stool frequency is 2 to 3 times per week. The stool is described as loose. The patient is not on a high fiber diet. He does not exercise regularly. There has not been adequate water intake. Associated symptoms include bloating. Risk factors include obesity and stress. He has tried mineral oil for the symptoms. The treatment provided no relief.      Review of Systems  Constitutional: Negative.   HENT: Negative.   Eyes: Negative.   Respiratory: Negative.   Cardiovascular: Negative.   Gastrointestinal: Positive for bloating and constipation.   Genitourinary: Negative.   Musculoskeletal: Negative.   Skin: Negative.   Neurological: Negative.   Psychiatric/Behavioral: Negative.      OBJECTIVE: BP (!) 114/56 (BP Location: Left Arm, Patient Position: Sitting, Cuff Size: Large)   Pulse 65   Temp 98.3 F (36.8 C) (Oral)   Resp 18   Ht 6\' 2"  (1.88 m)   Wt (!) 310 lb (140.6 kg)   SpO2 95%   BMI 39.80 kg/m   Wt Readings from Last 3 Encounters:  10/20/18 (!) 310 lb (140.6 kg)  07/16/18 (!) 316 lb (143.3 kg)  04/15/18 (!) 327 lb (148.3 kg)     Physical Exam Vitals signs and nursing note reviewed.  Constitutional:      General: He is not in acute distress.    Appearance: Normal appearance.  HENT:     Head: Normocephalic and atraumatic.  Eyes:     Extraocular Movements: Extraocular movements intact.     Conjunctiva/sclera: Conjunctivae normal.     Pupils: Pupils are equal, round, and reactive to light.  Cardiovascular:     Rate and Rhythm: Normal rate and regular rhythm.     Heart sounds: No murmur.  Pulmonary:     Effort: Pulmonary effort is normal.     Breath sounds: Normal breath sounds.  Abdominal:     General: Bowel sounds are normal. There is no distension.     Tenderness: There is no abdominal tenderness.  Musculoskeletal: Normal range of motion.  Skin:    General: Skin is warm and dry.  Neurological:     Mental Status: He is alert and oriented to person, place, and time.  Psychiatric:        Mood and Affect:  Mood normal.        Behavior: Behavior normal.        Thought Content: Thought content normal.        Judgment: Judgment normal.     ASSESSMENT/PLAN:   1. Type 2 diabetes mellitus with hyperosmolarity without coma, without long-term current use of insulin ([place]) Patient does not want to have labs due to cost. A1C is 6.6 today. He is at goal. Continue with current medications.  - Urinalysis Dipstick - HgB A1c - Comprehensive metabolic panel  2. Constipation, unspecified constipation type -  Ambulatory referral to Gastroenterology - docusate sodium (COLACE) 100 MG capsule; Take 1 capsule (100 mg total) by mouth 2 (two) times daily.  Dispense: 10 capsule; Refill: 0     Return in about 3 months (around 01/19/2019) for DM2.    The patient was given clear instructions to go to ER or return to medical center if symptoms do not improve, worsen or new problems develop. The patient verbalized understanding and agreed with plan of care.   Darius [name]. Darius Canary, FNP-BC Patient [facility] [address] [place], [place] [address] [phone]

## 2018-10-20 NOTE — Patient Instructions (Signed)
Diabetes Mellitus and Standards of Medical Care Managing diabetes (diabetes mellitus) can be complicated. Your diabetes treatment may be managed by a team of health care providers, including:  A physician who specializes in diabetes (endocrinologist).  A nurse practitioner or physician assistant.  Nurses.  A diet and nutrition specialist (registered dietitian).  A certified diabetes educator (CDE).  An exercise specialist.  A pharmacist.  An eye doctor.  A foot specialist (podiatrist).  A dentist.  A primary care provider.  A mental health provider. Your health care providers follow guidelines to help you get the best quality of care. The following schedule is a general guideline for your diabetes management plan. Your health care providers may give you more specific instructions. Physical exams Upon being diagnosed with diabetes mellitus, and each year after that, your health care provider will ask about your medical and family history. He or she will also do a physical exam. Your exam may include:  Measuring your height, weight, and body mass index (BMI).  Checking your blood pressure. This will be done at every routine medical visit. Your target blood pressure may vary depending on your medical conditions, your age, and other factors.  Thyroid gland exam.  Skin exam.  Screening for damage to your nerves (peripheral neuropathy). This may include checking the pulse in your legs and feet and checking the level of sensation in your hands and feet.  A complete foot exam to inspect the structure and skin of your feet, including checking for cuts, bruises, redness, blisters, sores, or other problems.  Screening for blood vessel (vascular) problems, which may include checking the pulse in your legs and feet and checking your temperature. Blood tests Depending on your treatment plan and your personal needs, you may have the following tests done:  HbA1c (hemoglobin A1c). This  test provides information about blood sugar (glucose) control over the previous 2-3 months. It is used to adjust your treatment plan, if needed. This test will be done: ? At least 2 times a year, if you are meeting your treatment goals. ? 4 times a year, if you are not meeting your treatment goals or if treatment goals have changed.  Lipid testing, including total, LDL, and HDL cholesterol and triglyceride levels. ? The goal for LDL is less than 100 mg/dL (5.5 mmol/L). If you are at high risk for complications, the goal is less than 70 mg/dL (3.9 mmol/L). ? The goal for HDL is 40 mg/dL (2.2 mmol/L) or higher for men and 50 mg/dL (2.8 mmol/L) or higher for women. An HDL cholesterol of 60 mg/dL (3.3 mmol/L) or higher gives some protection against heart disease. ? The goal for triglycerides is less than 150 mg/dL (8.3 mmol/L).  Liver function tests.  Kidney function tests.  Thyroid function tests. Dental and eye exams  Visit your dentist two times a year.  If you have type 1 diabetes, your health care provider may recommend an eye exam 3-5 years after you are diagnosed, and then once a year after your first exam. ? For children with type 1 diabetes, a health care provider may recommend an eye exam when your child is age 10 or older and has had diabetes for 3-5 years. After the first exam, your child should get an eye exam once a year.  If you have type 2 diabetes, your health care provider may recommend an eye exam as soon as you are diagnosed, and then once a year after your first exam. Immunizations   The  yearly flu (influenza) vaccine is recommended for everyone 6 months or older who has diabetes.  The pneumonia (pneumococcal) vaccine is recommended for everyone 2 years or older who has diabetes. If you are 65 or older, you may get the pneumonia vaccine as a series of two separate shots.  The hepatitis B vaccine is recommended for adults shortly after being diagnosed with diabetes.   Adults and children with diabetes should receive all other vaccines according to age-specific recommendations from the Centers for Disease Control and Prevention (CDC). Mental and emotional health Screening for symptoms of eating disorders, anxiety, and depression is recommended at the time of diagnosis and afterward as needed. If your screening shows that you have symptoms (positive screening result), you may need more evaluation and you may work with a mental health care provider. Treatment plan Your treatment plan will be reviewed at every medical visit. You and your health care provider will discuss:  How you are taking your medicines, including insulin.  Any side effects you are experiencing.  Your blood glucose target goals.  The frequency of your blood glucose monitoring.  Lifestyle habits, such as activity level as well as tobacco, alcohol, and substance use. Diabetes self-management education Your health care provider will assess how well you are monitoring your blood glucose levels and whether you are taking your insulin correctly. He or she may refer you to:  A certified diabetes educator to manage your diabetes throughout your life, starting at diagnosis.  A registered dietitian who can create or review your personal nutrition plan.  An exercise specialist who can discuss your activity level and exercise plan. Summary  Managing diabetes (diabetes mellitus) can be complicated. Your diabetes treatment may be managed by a team of health care providers.  Your health care providers follow guidelines in order to help you get the best quality of care.  Standards of care including having regular physical exams, blood tests, blood pressure monitoring, immunizations, screening tests, and education about how to manage your diabetes.  Your health care providers may also give you more specific instructions based on your individual health. This information is not intended to replace  advice given to you by your health care provider. Make sure you discuss any questions you have with your health care provider. Document Released: 11/24/2008 Document Revised: 10/16/2017 Document Reviewed: 10/26/2015 Elsevier Patient Education  2020 Elsevier Inc.  

## 2018-11-25 ENCOUNTER — Other Ambulatory Visit (HOSPITAL_COMMUNITY): Payer: Self-pay

## 2018-11-25 ENCOUNTER — Other Ambulatory Visit: Payer: Self-pay | Admitting: Internal Medicine

## 2018-11-25 DIAGNOSIS — E119 Type 2 diabetes mellitus without complications: Secondary | ICD-10-CM

## 2018-11-25 MED ORDER — SACUBITRIL-VALSARTAN 97-103 MG PO TABS: 1.0000 | ORAL_TABLET | Freq: Two times a day (BID) | ORAL | 3 refills | Status: DC

## 2018-11-25 MED FILL — AMIODARONE HCL 200 MG TAB: 200 | 30 days supply | Qty: 30 | Fill #4

## 2018-11-25 MED FILL — ELIQUIS 5 MG TABLET: 5 | 30 days supply | Qty: 60 | Fill #2

## 2018-11-25 MED FILL — ATORVASTATIN 80 MG TABLET: 80 | 30 days supply | Qty: 30 | Fill #4

## 2018-11-25 MED FILL — SPIRONOLACTONE 25 MG TABS: 25 | 30 days supply | Qty: 30 | Fill #2

## 2018-11-25 NOTE — Telephone Encounter (Signed)
Hi [place],  I am no longer at the [facility].  Thanks,  Venora Maples

## 2018-11-25 NOTE — Telephone Encounter (Signed)
Please fill for [facility] patient

## 2018-11-26 ENCOUNTER — Telehealth (HOSPITAL_COMMUNITY): Payer: Self-pay | Admitting: Pharmacy Technician

## 2018-11-26 NOTE — Telephone Encounter (Signed)
Patient Advocate Encounter   Received notification from Medicaid that prior authorization for Entresto 97-103mg  is required.   PA submitted on CoverMyMeds Key [id number] W Status is pending   Will continue to follow.  Charlann Boxer, CPhT

## 2018-11-29 MED FILL — metFORMIN HCL 1000 MG TABS: 1000 | 30 days supply | Qty: 60 | Fill #0

## 2018-11-29 MED FILL — ENTRESTO 97 MG-103 MG TAB: 97-103 | 30 days supply | Qty: 60 | Fill #0

## 2018-11-29 NOTE — Telephone Encounter (Signed)
Advanced Heart Failure Patient Advocate Encounter  Prior Authorization for [name] has been approved.    PA# [id number] Effective dates: 11/26/2018 through 11/21/2019  Patients co-pay is $3.00  Called [facility] and they are getting the medication ready for him.  Charlann Boxer, CPhT

## 2018-12-06 ENCOUNTER — Encounter: Payer: Self-pay | Admitting: Family Medicine

## 2018-12-27 MED FILL — ELIQUIS 5 MG TABLET: 5 | 30 days supply | Qty: 60 | Fill #3

## 2018-12-27 MED FILL — spIRONOLACTONE 25 MG TABS: 25 | 30 days supply | Qty: 30 | Fill #3

## 2018-12-27 MED FILL — ATORVASTATIN 80 MG TABLET: 80 | 30 days supply | Qty: 30 | Fill #5

## 2018-12-27 MED FILL — AMIODARONE HCL 200 MG TAB: 200 | 30 days supply | Qty: 30 | Fill #5

## 2018-12-27 MED FILL — ENTRESTO 97 MG-103 MG TAB: 97-103 | 30 days supply | Qty: 60 | Fill #0

## 2018-12-27 MED FILL — METOPROLOL SUCCINATE ER 100: 100 | 30 days supply | Qty: 60 | Fill #2

## 2018-12-27 MED FILL — metFORMIN HCL 1000 MG TABS: 1000 | 30 days supply | Qty: 60 | Fill #0

## 2019-01-19 ENCOUNTER — Ambulatory Visit: Payer: Medicaid Other | Admitting: Family Medicine

## 2019-01-24 ENCOUNTER — Other Ambulatory Visit: Payer: Self-pay | Admitting: Cardiology

## 2019-01-24 ENCOUNTER — Other Ambulatory Visit: Payer: Self-pay | Admitting: Family Medicine

## 2019-01-24 DIAGNOSIS — E119 Type 2 diabetes mellitus without complications: Secondary | ICD-10-CM

## 2019-01-24 MED FILL — ENTRESTO 97 MG-103 MG TAB: 97-103 | 30 days supply | Qty: 60 | Fill #1

## 2019-01-24 MED FILL — AMIODARONE HCL 200 MG TAB: 200 | 30 days supply | Qty: 30 | Fill #6

## 2019-01-24 MED FILL — ATORVASTATIN 80 MG TABLET: 80 | 30 days supply | Qty: 30 | Fill #6

## 2019-01-24 MED FILL — metFORMIN HCL 1000 MG TABS: 1000 | 30 days supply | Qty: 60 | Fill #0

## 2019-01-24 MED FILL — ELIQUIS 5 MG TABLET: 5 | 30 days supply | Qty: 60 | Fill #4

## 2019-01-24 MED FILL — SPIRONOLACTONE 25 MG TABS: 25 | 30 days supply | Qty: 30 | Fill #4

## 2019-01-24 MED FILL — FARXIGA 5 MG TABLET: 5 | 90 days supply | Qty: 90 | Fill #1

## 2019-01-25 MED FILL — METOPROLOL SUCCINATE ER 100: 100 | 30 days supply | Qty: 60 | Fill #0

## 2019-03-01 ENCOUNTER — Other Ambulatory Visit: Payer: Self-pay | Admitting: Family Medicine

## 2019-03-01 ENCOUNTER — Other Ambulatory Visit (HOSPITAL_COMMUNITY): Payer: Self-pay | Admitting: Cardiology

## 2019-03-01 DIAGNOSIS — E1100 Type 2 diabetes mellitus with hyperosmolarity without nonketotic hyperglycemic-hyperosmolar coma (NKHHC): Secondary | ICD-10-CM

## 2019-03-01 DIAGNOSIS — E119 Type 2 diabetes mellitus without complications: Secondary | ICD-10-CM

## 2019-03-01 MED FILL — SPIRONOLACTONE 25 MG TABS: 25 | 30 days supply | Qty: 30 | Fill #5

## 2019-03-01 MED FILL — METOPROLOL SUCCINATE ER 100: 100 | 30 days supply | Qty: 60 | Fill #0

## 2019-03-01 MED FILL — ELIQUIS 5 MG TABLET: 5 | 30 days supply | Qty: 60 | Fill #5

## 2019-03-01 MED FILL — ENTRESTO 97 MG-103 MG TAB: 97-103 | 30 days supply | Qty: 60 | Fill #2

## 2019-03-01 MED FILL — metFORMIN HCL 1000 MG TABS: 1000 | 30 days supply | Qty: 60 | Fill #0

## 2019-03-01 MED FILL — ATORVASTATIN 80 MG TABLET: 80 | 30 days supply | Qty: 30 | Fill #0

## 2019-03-01 MED FILL — AMIODARONE HCL 200 MG TAB: 200 | 30 days supply | Qty: 30 | Fill #0

## 2019-04-05 ENCOUNTER — Encounter: Payer: Self-pay | Admitting: Family Medicine

## 2019-04-05 ENCOUNTER — Other Ambulatory Visit: Payer: Self-pay

## 2019-04-05 ENCOUNTER — Ambulatory Visit (INDEPENDENT_AMBULATORY_CARE_PROVIDER_SITE_OTHER): Payer: Medicare Other | Admitting: Family Medicine

## 2019-04-05 VITALS — BP 96/61 | HR 61 | Temp 98.7°F | Resp 18 | Ht 74.0 in | Wt 314.0 lb

## 2019-04-05 DIAGNOSIS — S81801A Unspecified open wound, right lower leg, initial encounter: Secondary | ICD-10-CM | POA: Diagnosis not present

## 2019-04-05 DIAGNOSIS — E1100 Type 2 diabetes mellitus with hyperosmolarity without nonketotic hyperglycemic-hyperosmolar coma (NKHHC): Secondary | ICD-10-CM | POA: Diagnosis not present

## 2019-04-05 DIAGNOSIS — S81801S Unspecified open wound, right lower leg, sequela: Secondary | ICD-10-CM

## 2019-04-05 LAB — POCT URINALYSIS DIPSTICK
Glucose, UA: POSITIVE — AB
Leukocytes, UA: NEGATIVE
Nitrite, UA: NEGATIVE
Protein, UA: POSITIVE — AB
Spec Grav, UA: 1.015 (ref 1.010–1.025)
Urobilinogen, UA: 1 E.U./dL
pH, UA: 5.5 (ref 5.0–8.0)

## 2019-04-05 LAB — POCT GLYCOSYLATED HEMOGLOBIN (HGB A1C): Hemoglobin A1C: 7.3 % — AB (ref 4.0–5.6)

## 2019-04-05 MED ORDER — DOXYCYCLINE HYCLATE 100 MG PO TABS: 100.0000 mg | ORAL_TABLET | Freq: Two times a day (BID) | ORAL | 0 refills | Status: DC

## 2019-04-05 MED FILL — DOXYCYCLINE HYCLATE 100 MG: 100 | 10 days supply | Qty: 20 | Fill #0

## 2019-04-05 NOTE — Progress Notes (Signed)
Established Patient Office Visit  Subjective:  Patient ID: Darius [name], male    DOB: 17-Dec-1955  Age: 64 y.o. MRN: [id number]  CC:  Chief Complaint  Patient presents with  . Wound Check    wound on right lower leg not healing well   . Diabetes    HPI Darius [name], a 64 year old male with a medical history significant for uncontrolled diabetes mellitus, congestive heart failure, persistent atrial fibrillation on apixaban and amiodarone, OSA, and class II obesity presents complaining of right lower leg wound that has been present over the past month.  Patient states that the wound started as a small ulcer, maybe the size of a nickel.  He says over time the wound got bigger and has been draining.  He is kept it covered with over-the-counter Kerlix and gauze.  Also, he has been washing with soap and water.  He characterizes the wound is tender, draining, and nonhealing. Patient has uncontrolled diabetes mellitus.  He says that he has not been following a carbohydrate modified diet over the past several months.  He has been taking Metformin consistently.  He denies polyuria, polydipsia, or polyphagia.  Patient does not consistently check blood sugars at home.  Past Medical History:  Diagnosis Date  . Atrial fibrillation ([place])    unable to tolerate versed and fentanyl sedation and required gen anesthesia for TEE;  s/p TEE-DCCV (failed);  Amiodarone started  . Chronic diastolic heart failure ([place])    a. Echo 03/27/11: EF 50-55%, inferior hypokinesis, moderate LAE, mild RVE, normal pulmonary pressures. ;   b.  TEE (04/03/11): EF 40-45%, mild MR moderate LAE, no LAA clot, mild RAE;  c.  Echocardiogram (02/2013): EF 60-65%, Gr 2 DD, mildly dilated Ao root (Ao root dimension 39 mm), MAC, mild LAE, normal RVF  . Diabetes mellitus, type 2 ([place])   . Hx of cardiovascular stress test    Lexiscan Myoview (02/2013): No ischemia or scar, EF 51%, low risk  . Morbid obesity ([place])   . Sleep apnea    a. sleep  test 01/2013:  very severy OSA    Past Surgical History:  Procedure Laterality Date  . CARDIOVERSION  04/01/2011   Procedure: CARDIOVERSION;  Surgeon: Lelon Perla, MD;  Location: [place];  Service: Cardiovascular;  Laterality: N/A;  . CARDIOVERSION  04/03/2011   Procedure: CARDIOVERSION;  Surgeon: Lelon Perla, MD;  Location: [facility] ENDOSCOPY;  Service: Cardiovascular;  Laterality: N/A;  . CARDIOVERSION  04/03/2011   Procedure: CARDIOVERSION;  Surgeon: Lelon Perla, MD;  Location: [place];  Service: Cardiovascular;  Laterality: N/A;  . CARDIOVERSION N/A 06/15/2014   Procedure: CARDIOVERSION;  Surgeon: [name], MD;  Location: [facility] ENDOSCOPY;  Service: Cardiovascular;  Laterality: N/A;  . CARDIOVERSION N/A 09/08/2016   Procedure: CARDIOVERSION;  Surgeon: [name], MD;  Location: [facility] ENDOSCOPY;  Service: Cardiovascular;  Laterality: N/A;  . I & D EXTREMITY Right 09/19/2015   Procedure: IRRIGATION AND DEBRIDEMENT EXTREMITY;  Surgeon: Mcarthur Rossetti, MD;  Location: [place];  Service: Orthopedics;  Laterality: Right;  . RIGHT/LEFT HEART CATH AND CORONARY ANGIOGRAPHY N/A 04/21/2016   Procedure: Right/Left Heart Cath and Coronary Angiography;  Surgeon: [name], MD;  Location: [place] CV LAB;  Service: Cardiovascular;  Laterality: N/A;  . TEE WITHOUT CARDIOVERSION  04/01/2011   Procedure: TRANSESOPHAGEAL ECHOCARDIOGRAM (TEE);  Surgeon: Lelon Perla, MD;  Location: [facility] ENDOSCOPY;  Service: Cardiovascular;  Laterality: N/A;  . TEE WITHOUT CARDIOVERSION  04/03/2011  Procedure: TRANSESOPHAGEAL ECHOCARDIOGRAM (TEE);  Surgeon: Lelon Perla, MD;  Location: [facility] ENDOSCOPY;  Service: Cardiovascular;  Laterality: N/A;  . TEE WITHOUT CARDIOVERSION  04/03/2011   Procedure: TRANSESOPHAGEAL ECHOCARDIOGRAM (TEE);  Surgeon: Lelon Perla, MD;  Location: [place];  Service: Cardiovascular;  Laterality: N/A;  . TEE WITHOUT CARDIOVERSION N/A 04/24/2016   Procedure: TRANSESOPHAGEAL  ECHOCARDIOGRAM (TEE);  Surgeon: [name], MD;  Location: [place];  Service: Cardiovascular;  Laterality: N/A;  . TEE WITHOUT CARDIOVERSION N/A 05/26/2016   Procedure: TRANSESOPHAGEAL ECHOCARDIOGRAM (TEE);  Surgeon: [name], MD;  Location: [place];  Service: Cardiovascular;  Laterality: N/A;  . TEE WITHOUT CARDIOVERSION N/A 09/08/2016   Procedure: TRANSESOPHAGEAL ECHOCARDIOGRAM (TEE);  Surgeon: [name], MD;  Location: [facility] ENDOSCOPY;  Service: Cardiovascular;  Laterality: N/A;    Family History  Problem Relation Age of Onset  . Diabetes Father        died in his 110s  . Hypertension Father   . Seizures Mother        died in her 48s  . Hypertension Sister   . Hypertension Paternal Grandfather   . Heart attack Neg Hx   . Stroke Neg Hx     Social History   Socioeconomic History  . Marital status: Single    Spouse name: Not on file  . Number of children: 0  . Years of education: 44  . Highest education level: Not on file  Occupational History  . Occupation: unemployeed  Tobacco Use  . Smoking status: Never Smoker  . Smokeless tobacco: Never Used  Substance and Sexual Activity  . Alcohol use: Yes    Comment: some  . Drug use: No  . Sexual activity: Never  Other Topics Concern  . Not on file  Social History Narrative   Admits that he eats poorly and eats a lot at work.      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   Social Determinants of Health   Financial Resource Strain:   . Difficulty of Paying Living Expenses: Not on file  Food Insecurity:   . Worried About Charity fundraiser in the Last Year: Not on file  . Ran Out of Food in the Last Year: Not on file  Transportation Needs:   . Lack of Transportation (Medical): Not on file  . Lack of Transportation (Non-Medical): Not on file  Physical Activity:   . Days of Exercise per Week: Not on file  . Minutes of Exercise per Session: Not on file  Stress:   . Feeling of  Stress : Not on file  Social Connections:   . Frequency of Communication with Friends and Family: Not on file  . Frequency of Social Gatherings with Friends and Family: Not on file  . Attends Religious Services: Not on file  . Active Member of Clubs or Organizations: Not on file  . Attends Archivist Meetings: Not on file  . Marital Status: Not on file  Intimate Partner Violence:   . Fear of Current or Ex-Partner: Not on file  . Emotionally Abused: Not on file  . Physically Abused: Not on file  . Sexually Abused: Not on file    Outpatient Medications Prior to Visit  Medication Sig Dispense Refill  . amiodarone (PACERONE) 200 MG tablet TAKE 1 TABLET (200 MG TOTAL) BY MOUTH DAILY. 30 tablet 2  . atorvastatin (LIPITOR) 80 MG tablet TAKE 1 TABLET BY MOUTH AT BEDTIME. 30 tablet 6  .  docusate sodium (COLACE) 100 MG capsule Take 1 capsule (100 mg total) by mouth 2 (two) times daily. 10 capsule 0  . ELIQUIS 5 MG TABS tablet TAKE 1 TABLET BY MOUTH 2 TIMES DAILY. 60 tablet 5  . FARXIGA 5 MG TABS tablet TAKE 1 TABLET BY MOUTH ONCE DAILY 90 tablet 1  . furosemide (LASIX) 40 MG tablet TAKE 1 TABLET BY MOUTH ONCE DAILY 100 tablet 2  . ketoconazole (NIZORAL) 2 % shampoo Apply 1 application topically 2 (two) times a week. 120 mL 0  . metFORMIN (GLUCOPHAGE) 1000 MG tablet TAKE 1 TABLET BY MOUTH TWICE A DAY WITH FOOD 60 tablet 0  . metoprolol succinate (TOPROL-XL) 100 MG 24 hr tablet TAKE 1 TABLET BY MOUTH 2 TIMES DAILY. TAKE WITH OR IMMEDIATELY FOLLOWING A MEAL. 180 tablet 3  . nystatin-triamcinolone ointment (MYCOLOG) Apply 1 application topically 2 (two) times daily. 60 g 1  . sacubitril-valsartan (ENTRESTO) 97-103 MG Take 1 tablet by mouth 2 (two) times daily. 180 tablet 3  . spironolactone (ALDACTONE) 25 MG tablet TAKE 1 TABLET BY MOUTH ONCE DAILY 30 tablet 5   No facility-administered medications prior to visit.    No Known Allergies  ROS Review of Systems  Constitutional:  Negative.   HENT: Negative.   Respiratory: Negative.   Cardiovascular: Negative.   Gastrointestinal: Negative.   Skin:       Right left ulcer, draining      Objective:    Physical Exam  Constitutional: He is oriented to person, place, and time. He appears well-developed and well-nourished.  HENT:  Head: Normocephalic.  Eyes: Pupils are equal, round, and reactive to light.  Cardiovascular: Normal rate and regular rhythm.  Pulmonary/Chest: Effort normal.  Abdominal: Soft. Bowel sounds are normal.  Musculoskeletal:        General: Normal range of motion.     Cervical back: Normal range of motion.  Neurological: He is alert and oriented to person, place, and time.  Skin: Skin is warm.     1.5X3.1, purulent drainage, erythema, irregular margins, leg erythema  Skin dry, flaking, generalized   Psychiatric: He has a normal mood and affect. His behavior is normal. Judgment and thought content normal.    BP 96/61 (BP Location: Left Arm, Patient Position: Sitting, Cuff Size: Large)   Pulse 61   Temp 98.7 F (37.1 C) (Oral)   Resp 18   Ht _0  (1.88 m)   Wt (!) 314 lb (142.4 kg)   SpO2 95%   BMI 40.32 kg/m  Wt Readings from Last 3 Encounters:  04/05/19 (!) 314 lb (142.4 kg)  10/20/18 (!) 310 lb (140.6 kg)  07/16/18 (!) 316 lb (143.3 kg)     Health Maintenance Due  Topic Date Due  . OPHTHALMOLOGY EXAM  01/09/1966  . PNA vac Low Risk Adult (1 of 2 - PCV13) 01/09/2019  . URINE MICROALBUMIN  03/11/2019    There are no preventive care reminders to display for this patient.  Lab Results  Component Value Date   TSH 0.083 (L) 10/02/2017   Lab Results  Component Value Date   WBC 9.3 11/12/2016   HGB 14.4 11/12/2016   HCT 41.9 11/12/2016   MCV 87.7 11/12/2016   PLT 222 11/12/2016   Lab Results  Component Value Date   NA 137 01/22/2018   K 4.1 01/22/2018   CO2 24 01/22/2018   GLUCOSE 191 (H) 01/22/2018   BUN 18 01/22/2018   CREATININE 1.10 01/22/2018   BILITOT  0.7 01/22/2018   ALKPHOS 52 01/22/2018   AST 18 01/22/2018   ALT 25 01/22/2018   PROT 6.6 01/22/2018   ALBUMIN 3.5 01/22/2018   CALCIUM 8.9 01/22/2018   ANIONGAP 12 01/22/2018   GFR 89.09 05/15/2014   Lab Results  Component Value Date   CHOL 120 12/12/2016   Lab Results  Component Value Date   HDL 62 12/12/2016   Lab Results  Component Value Date   LDLCALC 42 12/12/2016   Lab Results  Component Value Date   TRIG 77 12/12/2016   Lab Results  Component Value Date   CHOLHDL 1.9 12/12/2016   Lab Results  Component Value Date   HGBA1C 7.3 (A) 04/05/2019      Assessment & Plan:   Problem List Items Addressed This Visit      Endocrine   Type 2 diabetes mellitus ([place]) - Primary (Chronic)   Relevant Orders   HgB A1c (Completed)   Urinalysis Dipstick (Completed)   CBC with Differential   Basic Metabolic Panel    Other Visit Diagnoses    Open leg wound, right, sequela       Relevant Medications   doxycycline (VIBRA-TABS) 100 MG tablet   Other Relevant Orders   WOUND CULTURE      Meds ordered this encounter  Medications  . doxycycline (VIBRA-TABS) 100 MG tablet    Sig: Take 1 tablet (100 mg total) by mouth 2 (two) times daily.    Dispense:  20 tablet    Refill:  0    Order Specific Question:   Supervising Provider    Answer:   Tresa Garter [id number]  Uncontrolled diabetes mellitus: Patient's hemoglobin A1c has increased to 7.3, which is up from 6.6.  Patient and I discussed carbohydrate modified diet at length.  Also, discussed the importance of taking medications consistently in order to achieve positive outcomes. Regular foot care is very important.  This patient will warrant a referral to podiatry going forward.  Right lower extremity diabetic ulcer: Patient has nonhealing, draining ulcer to right lower right posterior. Obtain wound culture and follow closely.  Will treat empirically with doxycycline 100 mg twice daily.  Follow sensitivities and  adjust accordingly.  Also, patient advised to clean twice daily with over-the-counter saline.  Also, leave open to air.  Follow-up: Follow up in 2 weeks after completing antibiotics. If not improved,   Donia Pounds  APRN, MSN, FNP-C [facility] [address] [place],  [address] [phone]

## 2019-04-05 NOTE — Patient Instructions (Addendum)
Follow up in 1 week for right leg wound Doxycycline 100 mg twice daily for 10 days.  Leave open to air, clean with saline. Keep wound clean and dry.   .   Wound Care, Adult Taking care of your wound properly can help to prevent pain, infection, and scarring. It can also help your wound to heal more quickly. How to care for your wound Wound care      Follow instructions from your health care provider about how to take care of your wound. Make sure you: ? Wash your hands with soap and water before you change the bandage (dressing). If soap and water are not available, use hand sanitizer. ? Change your dressing as told by your health care provider. ? Leave stitches (sutures), skin glue, or adhesive strips in place. These skin closures may need to stay in place for 2 weeks or longer. If adhesive strip edges start to loosen and curl up, you may trim the loose edges. Do not remove adhesive strips completely unless your health care provider tells you to do that.  Check your wound area every day for signs of infection. Check for: ? Redness, swelling, or pain. ? Fluid or blood. ? Warmth. ? Pus or a bad smell.  Ask your health care provider if you should clean the wound with mild soap and water. Doing this may include: ? Using a clean towel to pat the wound dry after cleaning it. Do not rub or scrub the wound. ? Applying a cream or ointment. Do this only as told by your health care provider. ? Covering the incision with a clean dressing.  Ask your health care provider when you can leave the wound uncovered.  Keep the dressing dry until your health care provider says it can be removed. Do not take baths, swim, use a hot tub, or do anything that would put the wound underwater until your health care provider approves. Ask your health care provider if you can take showers. You may only be allowed to take sponge baths. Medicines   If you were prescribed an antibiotic medicine, cream, or  ointment, take or use the antibiotic as told by your health care provider. Do not stop taking or using the antibiotic even if your condition improves.  Take over-the-counter and prescription medicines only as told by your health care provider. If you were prescribed pain medicine, take it 30 or more minutes before you do any wound care or as told by your health care provider. General instructions  Return to your normal activities as told by your health care provider. Ask your health care provider what activities are safe.  Do not scratch or pick at the wound.  Do not use any products that contain nicotine or tobacco, such as cigarettes and e-cigarettes. These may delay wound healing. If you need help quitting, ask your health care provider.  Keep all follow-up visits as told by your health care provider. This is important.  Eat a diet that includes protein, vitamin A, vitamin C, and other nutrient-rich foods to help the wound heal. ? Foods rich in protein include meat, dairy, beans, nuts, and other sources. ? Foods rich in vitamin A include carrots and dark green, leafy vegetables. ? Foods rich in vitamin C include citrus, tomatoes, and other fruits and vegetables. ? Nutrient-rich foods have protein, carbohydrates, fat, vitamins, or minerals. Eat a variety of healthy foods including vegetables, fruits, and whole grains. Contact a health care provider if:  You received  a tetanus shot and you have swelling, severe pain, redness, or bleeding at the injection site.  Your pain is not controlled with medicine.  You have redness, swelling, or pain around the wound.  You have fluid or blood coming from the wound.  Your wound feels warm to the touch.  You have pus or a bad smell coming from the wound.  You have a fever or chills.  You are nauseous or you vomit.  You are dizzy. Get help right away if:  You have a red streak going away from your wound.  The edges of the wound open up  and separate.  Your wound is bleeding, and the bleeding does not stop with gentle pressure.  You have a rash.  You faint.  You have trouble breathing. Summary  Always wash your hands with soap and water before changing your bandage (dressing).  To help with healing, eat foods that are rich in protein, vitamin A, vitamin C, and other nutrients.  Check your wound every day for signs of infection. Contact your health care provider if you suspect that your wound is infected. This information is not intended to replace advice given to you by your health care provider. Make sure you discuss any questions you have with your health care provider. Document Revised: 05/17/2018 Document Reviewed: 08/14/2015 Elsevier Patient Education  [place].

## 2019-04-06 LAB — BASIC METABOLIC PANEL
BUN/Creatinine Ratio: 13 (ref 10–24)
BUN: 18 mg/dL (ref 8–27)
CO2: 20 mmol/L (ref 20–29)
Calcium: 10.2 mg/dL (ref 8.6–10.2)
Chloride: 101 mmol/L (ref 96–106)
Creatinine, Ser: 1.4 mg/dL — ABNORMAL HIGH (ref 0.76–1.27)
GFR calc Af Amer: 61 mL/min/{1.73_m2} (ref >59)
GFR calc non Af Amer: 52 mL/min/{1.73_m2} — ABNORMAL LOW (ref >59)
Glucose: 142 mg/dL — ABNORMAL HIGH (ref 65–99)
Potassium: 5.1 mmol/L (ref 3.5–5.2)
Sodium: 138 mmol/L (ref 134–144)

## 2019-04-06 LAB — CBC WITH DIFFERENTIAL/PLATELET
Basophils Absolute: 0.1 10*3/uL (ref 0.0–0.2)
Basos: 1 %
EOS (ABSOLUTE): 0.6 10*3/uL — ABNORMAL HIGH (ref 0.0–0.4)
Eos: 8 %
Hematocrit: 48.6 % (ref 37.5–51.0)
Hemoglobin: 16.8 g/dL (ref 13.0–17.7)
Immature Grans (Abs): 0 10*3/uL (ref 0.0–0.1)
Immature Granulocytes: 0 %
Lymphocytes Absolute: 1.6 10*3/uL (ref 0.7–3.1)
Lymphs: 20 %
MCH: 30.7 pg (ref 26.6–33.0)
MCHC: 34.6 g/dL (ref 31.5–35.7)
MCV: 89 fL (ref 79–97)
Monocytes Absolute: 0.8 10*3/uL (ref 0.1–0.9)
Monocytes: 10 %
Neutrophils Absolute: 5.1 10*3/uL (ref 1.4–7.0)
Neutrophils: 61 %
Platelets: 256 10*3/uL (ref 150–450)
RBC: 5.48 x10E6/uL (ref 4.14–5.80)
RDW: 13 % (ref 11.6–15.4)
WBC: 8.3 10*3/uL (ref 3.4–10.8)

## 2019-04-07 ENCOUNTER — Other Ambulatory Visit (HOSPITAL_COMMUNITY): Payer: Self-pay | Admitting: Cardiology

## 2019-04-07 ENCOUNTER — Other Ambulatory Visit: Payer: Self-pay | Admitting: Family Medicine

## 2019-04-07 ENCOUNTER — Telehealth: Payer: Self-pay

## 2019-04-07 DIAGNOSIS — E119 Type 2 diabetes mellitus without complications: Secondary | ICD-10-CM

## 2019-04-07 DIAGNOSIS — E11622 Type 2 diabetes mellitus with other skin ulcer: Secondary | ICD-10-CM

## 2019-04-07 MED ORDER — AMOXICILLIN-POT CLAVULANATE 875-125 MG PO TABS: 1.0000 | ORAL_TABLET | Freq: Two times a day (BID) | ORAL | 0 refills | Status: AC

## 2019-04-07 MED FILL — FARXIGA 5 MG TABLET: 5 | 90 days supply | Qty: 90 | Fill #0

## 2019-04-07 MED FILL — AMOX-CLAV 875-125 MG TABLET: 875-125 | 14 days supply | Qty: 28 | Fill #0

## 2019-04-07 MED FILL — METOPROLOL SUCCINATE ER 100: 100 | 30 days supply | Qty: 60 | Fill #1

## 2019-04-07 MED FILL — ATORVASTATIN 80 MG TABLET: 80 | 30 days supply | Qty: 30 | Fill #1

## 2019-04-07 MED FILL — AMIODARONE HCL 200 MG TAB: 200 | 30 days supply | Qty: 30 | Fill #1

## 2019-04-07 MED FILL — metFORMIN HCL 1000 MG TABS: 1000 | 30 days supply | Qty: 60 | Fill #0

## 2019-04-07 MED FILL — ENTRESTO 97 MG-103 MG TAB: 97-103 | 30 days supply | Qty: 60 | Fill #3

## 2019-04-07 NOTE — Telephone Encounter (Signed)
Called and spoke with patient, advised that antibiotic needed to be changed. Asked that he stop taking doxycycline and start Augmentin 875mg  every 12 hours for 14 days. Asked that he keep follow up in 2 weeks for wound check. Patient verbalized understanding. Thanks!

## 2019-04-07 NOTE — Progress Notes (Signed)
Meds ordered this encounter  Medications  . amoxicillin-clavulanate (AUGMENTIN) 875-125 MG tablet    Sig: Take 1 tablet by mouth 2 (two) times daily for 14 days.    Dispense:  28 tablet    Refill:  0    Order Specific Question:   Supervising Provider    Answer:   Tresa Garter [id number]     Donia Pounds  APRN, MSN, FNP-C Patient [facility] [address] [place], [place] [address] [phone]

## 2019-04-07 NOTE — Telephone Encounter (Signed)
-----   Message from Dorena Dew, [place] sent at 04/07/2019  3:19 PM EST ----- Regarding: lab results Please inform patient that there is moderate growth of bacteria in his right lower leg wound. Will discontinue doxycycline and start Augmentin 875-125 mg every 12 hours for 14 days.   Follow up with me for wound surveillance as scheduled.    Donia Pounds  APRN, MSN, FNP-C Patient [facility] [address] [place], [place] [address] [phone]

## 2019-04-08 MED FILL — ELIQUIS 5 MG TABLET: 5 | 30 days supply | Qty: 60 | Fill #0

## 2019-04-08 MED FILL — SPIRONOLACTONE 25 MG TABS: 25 | 30 days supply | Qty: 30 | Fill #0

## 2019-04-09 LAB — WOUND CULTURE: Organism ID, Bacteria: NONE SEEN

## 2019-04-12 ENCOUNTER — Ambulatory Visit: Payer: Medicaid Other | Admitting: Family Medicine

## 2019-04-13 ENCOUNTER — Other Ambulatory Visit: Payer: Self-pay | Admitting: Family Medicine

## 2019-04-13 ENCOUNTER — Telehealth: Payer: Self-pay

## 2019-04-13 NOTE — Telephone Encounter (Signed)
-----   Message from Dorena Dew, [place] sent at 04/13/2019  2:12 PM EST ----- Regarding: Wound inquiry Please call patient to inquire about right lower extremity wound and whether he has completed antibiotics.   Thank you,   Donia Pounds  APRN, MSN, FNP-C Patient [facility] [address] [place], [place] [address] [phone]

## 2019-04-13 NOTE — Telephone Encounter (Signed)
Called and spoke with patient. He states wound is starting to get better he is still on abx until 04/21/2019. He is scheduled to follow up on 3/16//2021. Thanks!

## 2019-04-26 ENCOUNTER — Other Ambulatory Visit: Payer: Self-pay

## 2019-04-26 ENCOUNTER — Encounter: Payer: Self-pay | Admitting: Family Medicine

## 2019-04-26 ENCOUNTER — Ambulatory Visit (INDEPENDENT_AMBULATORY_CARE_PROVIDER_SITE_OTHER): Payer: Medicare Other | Admitting: Family Medicine

## 2019-04-26 VITALS — BP 91/59 | HR 74 | Temp 98.0°F | Resp 18 | Ht 74.0 in | Wt 317.0 lb

## 2019-04-26 DIAGNOSIS — S81801D Unspecified open wound, right lower leg, subsequent encounter: Secondary | ICD-10-CM | POA: Diagnosis not present

## 2019-04-26 DIAGNOSIS — S81801S Unspecified open wound, right lower leg, sequela: Secondary | ICD-10-CM

## 2019-04-26 MED ORDER — AMOXICILLIN-POT CLAVULANATE 875-125 MG PO TABS: 1.0000 | ORAL_TABLET | Freq: Two times a day (BID) | ORAL | 0 refills | Status: AC

## 2019-04-26 MED FILL — AMOX-CLAV 875-125 MG TABLET: 875-125 | 7 days supply | Qty: 14 | Fill #0

## 2019-04-26 NOTE — Patient Instructions (Addendum)
Amoxicillin; Clavulanic Acid Chewable Tablets What is this medicine? AMOXICILLIN; CLAVULANIC ACID (a mox i SIL in; KLAV yoo lan ic AS id) is a penicillin antibiotic. It treats some infections caused by bacteria. It will not work for colds, the flu, or other viruses. This medicine may be used for other purposes; ask your health care provider or pharmacist if you have questions. COMMON BRAND NAME(S): Augmentin What should I tell my health care provider before I take this medicine? They need to know if you have any of these conditions:  bowel disease, like colitis  kidney disease  liver disease  mononucleosis  phenylketonuria  an unusual or allergic reaction to amoxicillin, penicillin, cephalosporin, other antibiotics, clavulanic acid, other medicines, foods, dyes, or preservatives  pregnant or trying to get pregnant  breast-feeding How should I use this medicine? Take this drug by mouth. Take it as directed on the prescription label at the same time every day. Chew or crush it completely before swallowing. Do not swallow tablets whole. You can take it with or without food. If it upsets your stomach, take it with food. Take all of this drug unless your health care provider tells you to stop it early. Keep taking it even if you think you are better. Talk to your health care provider about the use of this drug in children. While it may be prescribed for selected conditions, precautions do apply. Overdosage: If you think you have taken too much of this medicine contact a poison control center or emergency room at once. NOTE: This medicine is only for you. Do not share this medicine with others. What if I miss a dose? If you miss a dose, take it as soon as you can. If it is almost time for your next dose, take only that dose. Do not take double or extra doses. What may interact with this medicine?  allopurinol  anticoagulants  birth control pills  methotrexate  probenecid This list  may not describe all possible interactions. Give your health care provider a list of all the medicines, herbs, non-prescription drugs, or dietary supplements you use. Also tell them if you smoke, drink alcohol, or use illegal drugs. Some items may interact with your medicine. What should I watch for while using this medicine? Tell your doctor or healthcare provider if your symptoms do not improve. This medicine may cause serious skin reactions. They can happen weeks to months after starting the medicine. Contact your healthcare provider right away if you notice fevers or flu-like symptoms with a rash. The rash may be red or purple and then turn into blisters or peeling of the skin. Or, you might notice a red rash with swelling of the face, lips or lymph nodes in your neck or under your arms. Do not treat diarrhea with over the counter products. Contact your doctor if you have diarrhea that lasts more than 2 days or if it is severe and watery. If you have diabetes, you may get a false-positive result for sugar in your urine. Check with your doctor or healthcare provider. Birth control pills may not work properly while you are taking this medicine. Talk to your doctor about using an extra method of birth control. What side effects may I notice from receiving this medicine? Side effects that you should report to your doctor or health care professional as soon as possible:  allergic reactions like skin rash, itching or hives, swelling of the face, lips, or tongue  breathing problems  dark urine    fever or chills, sore throat  redness, blistering, peeling, or loosening of the skin, including inside the mouth  seizures  trouble passing urine or change in the amount of urine  unusual bleeding, bruising  unusually weak or tired  white patches or sores in the mouth or throat Side effects that usually do not require medical attention (report to your doctor or health care professional if they  continue or are bothersome):  diarrhea  dizziness  headache  nausea, vomiting  stomach upset  vaginal or anal irritation This list may not describe all possible side effects. Call your doctor for medical advice about side effects. You may report side effects to FDA at 1-800-FDA-1088. Where should I keep my medicine? Keep out of the reach of children and pets. Store at room temperature between 20 and 25 degrees C (68 and 77 degrees F). Throw away any unused drug after the expiration date. NOTE: This sheet is a summary. It may not cover all possible information. If you have questions about this medicine, talk to your doctor, pharmacist, or health care provider.  2020 Elsevier/Gold Standard (2018-10-11 08:58:41)     COVID-19 Vaccine Information can be found at: ShippingScam.co.uk For questions related to vaccine distribution or appointments, please email vaccine@[email or web address] or call [phone].

## 2019-04-26 NOTE — Progress Notes (Signed)
Established Patient Office Visit  Subjective:  Patient ID: Darius [name], male    DOB: 01-Jun-1955  Age: 64 y.o. MRN: [id number]  CC:  Chief Complaint  Patient presents with  . Wound Check    HPI Darius [name] is a 64 year old male with a medical history significant for uncontrolled diabetes mellitus, CHF, persistent atrial fibrillation on apixaban and amiodarone, OSA, and class II obesity presents for follow-up of right lower extremity wound that has been present for greater than a month.  Patient previously stated that wound started as a small ulcer, may be the size of a nickel.  He says over time the wound got larger and has been draining.  Patient was started on antibiotics.  At previous appointment wound culture was obtained.  Culture yielded beta-hemolytic Streptococcus group B with a moderate amount of growth.  Patient was started on Augmentin 875-125 mg every 12 hours.  Patient says that he has been taking medications consistently.  He says that wound started to develop a scab, however wound was scraped against a hard surface and reopen.  He reports some drainage, but he says it feels a lot better.  Patient denies paresthesias, dizziness, erythema around wound, urinary symptoms nausea, vomiting, or diarrhea.  He has been tolerating antibiotics well.  Past Medical History:  Diagnosis Date  . Atrial fibrillation ([facility])    unable to tolerate versed and fentanyl sedation and required gen anesthesia for TEE;  s/p TEE-DCCV (failed);  Amiodarone started  . Chronic diastolic heart failure ([facility])    a. Echo 03/27/11: EF 50-55%, inferior hypokinesis, moderate LAE, mild RVE, normal pulmonary pressures. ;   b.  TEE (04/03/11): EF 40-45%, mild MR moderate LAE, no LAA clot, mild RAE;  c.  Echocardiogram (02/2013): EF 60-65%, Gr 2 DD, mildly dilated Ao root (Ao root dimension 39 mm), MAC, mild LAE, normal RVF  . Diabetes mellitus, type 2 ([facility])   . Hx of cardiovascular stress test    Lexiscan Myoview  (02/2013): No ischemia or scar, EF 51%, low risk  . Morbid obesity ([facility])   . Sleep apnea    a. sleep test 01/2013:  very severy OSA    Past Surgical History:  Procedure Laterality Date  . CARDIOVERSION  04/01/2011   Procedure: CARDIOVERSION;  Surgeon: Lelon Perla, MD;  Location: [facility];  Service: Cardiovascular;  Laterality: N/A;  . CARDIOVERSION  04/03/2011   Procedure: CARDIOVERSION;  Surgeon: Lelon Perla, MD;  Location[facility] ENDOSCOPY;  Service: Cardiovascular;  Laterality: N/A;  . CARDIOVERSION  04/03/2011   Procedure: CARDIOVERSION;  Surgeon: Lelon Perla, MD;  Location: [facility];  Service: Cardiovascular;  Laterality: N/A;  . CARDIOVERSION N/A 06/15/2014   Procedure: CARDIOVERSION;  Surgeon: [name], MD;  [facility] ENDOSCOPY;  Service: Cardiovascular;  Laterality: N/A;  . CARDIOVERSION N/A 09/08/2016   Procedure: CARDIOVERSION;  Surgeon: [name], MD;  Location[facility] ENDOSCOPY;  Service: Cardiovascular;  Laterality: N/A;  . I & D EXTREMITY Right 09/19/2015   Procedure: IRRIGATION AND DEBRIDEMENT EXTREMITY;  Surgeon: Mcarthur Rossetti, MD;  Location: [facility];  Service: Orthopedics;  Laterality: Right;  . RIGHT/LEFT HEART CATH AND CORONARY ANGIOGRAPHY N/A 04/21/2016   Procedure: Right/Left Heart Cath and Coronary Angiography;  Surgeon: [name], MD;  Location: [facility] CV LAB;  Service: Cardiovascular;  Laterality: N/A;  . TEE WITHOUT CARDIOVERSION  04/01/2011   Procedure: TRANSESOPHAGEAL ECHOCARDIOGRAM (TEE);  Surgeon: Lelon Perla, MD;  Location: [facility];  Service: Cardiovascular;  Laterality: N/A;  . TEE WITHOUT CARDIOVERSION  04/03/2011   Procedure: TRANSESOPHAGEAL ECHOCARDIOGRAM (TEE);  Surgeon: Lelon Perla, MD;  Location: [facility] ENDOSCOPY;  Service: Cardiovascular;  Laterality: N/A;  . TEE WITHOUT CARDIOVERSION  04/03/2011   Procedure: TRANSESOPHAGEAL ECHOCARDIOGRAM (TEE);  Surgeon: Lelon Perla, MD;  Location: [facility];  Service:  Cardiovascular;  Laterality: N/A;  . TEE WITHOUT CARDIOVERSION N/A 04/24/2016   Procedure: TRANSESOPHAGEAL ECHOCARDIOGRAM (TEE);  Surgeon: [name], MD;  Location: [facility];  Service: Cardiovascular;  Laterality: N/A;  . TEE WITHOUT CARDIOVERSION N/A 05/26/2016   Procedure: TRANSESOPHAGEAL ECHOCARDIOGRAM (TEE);  Surgeon: [name], MD;  Location: [facility];  Service: Cardiovascular;  Laterality: N/A;  . TEE WITHOUT CARDIOVERSION N/A 09/08/2016   Procedure: TRANSESOPHAGEAL ECHOCARDIOGRAM (TEE);  Surgeon: [name], MD;  Location: [facility] ENDOSCOPY;  Service: Cardiovascular;  Laterality: N/A;    Family History  Problem Relation Age of Onset  . Diabetes Father        died in his 40s  . Hypertension Father   . Seizures Mother        died in her 28s  . Hypertension Sister   . Hypertension Paternal Grandfather   . Heart attack Neg Hx   . Stroke Neg Hx     Social History   Socioeconomic History  . Marital status: Single    Spouse name: Not on file  . Number of children: 0  . Years of education: 61  . Highest education level: Not on file  Occupational History  . Occupation: unemployeed  Tobacco Use  . Smoking status: Never Smoker  . Smokeless tobacco: Never Used  Substance and Sexual Activity  . Alcohol use: Yes    Comment: some  . Drug use: No  . Sexual activity: Never  Other Topics Concern  . Not on file  Social History Narrative   Admits that he eats poorly and eats a lot at work.      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   Social Determinants of Health   Financial Resource Strain:   . Difficulty of Paying Living Expenses:   Food Insecurity:   . Worried About Charity fundraiser in the Last Year:   . Arboriculturist in the Last Year:   Transportation Needs:   . Film/video editor (Medical):   Marland Kitchen Lack of Transportation (Non-Medical):   Physical Activity:   . Days of Exercise per Week:   . Minutes of Exercise per  Session:   Stress:   . Feeling of Stress :   Social Connections:   . Frequency of Communication with Friends and Family:   . Frequency of Social Gatherings with Friends and Family:   . Attends Religious Services:   . Active Member of Clubs or Organizations:   . Attends Archivist Meetings:   Marland Kitchen Marital Status:   Intimate Partner Violence:   . Fear of Current or Ex-Partner:   . Emotionally Abused:   Marland Kitchen Physically Abused:   . Sexually Abused:     Outpatient Medications Prior to Visit  Medication Sig Dispense Refill  . amiodarone (PACERONE) 200 MG tablet TAKE 1 TABLET (200 MG TOTAL) BY MOUTH DAILY. 30 tablet 2  . apixaban (ELIQUIS) 5 MG TABS tablet Take 1 tablet (5 mg total) by mouth 2 (two) times daily. Please call for an office visit [phone] 60 tablet 1  . atorvastatin (LIPITOR) 80 MG tablet TAKE 1 TABLET BY MOUTH AT  BEDTIME. 30 tablet 6  . docusate sodium (COLACE) 100 MG capsule Take 1 capsule (100 mg total) by mouth 2 (two) times daily. 10 capsule 0  . FARXIGA 5 MG TABS tablet TAKE 1 TABLET BY MOUTH ONCE DAILY 90 tablet 1  . furosemide (LASIX) 40 MG tablet TAKE 1 TABLET BY MOUTH ONCE DAILY 100 tablet 2  . ketoconazole (NIZORAL) 2 % shampoo Apply 1 application topically 2 (two) times a week. 120 mL 0  . metFORMIN (GLUCOPHAGE) 1000 MG tablet TAKE 1 TABLET BY MOUTH TWICE A DAY WITH FOOD 60 tablet 0  . metoprolol succinate (TOPROL-XL) 100 MG 24 hr tablet TAKE 1 TABLET BY MOUTH 2 TIMES DAILY. TAKE WITH OR IMMEDIATELY FOLLOWING A MEAL. 180 tablet 3  . nystatin-triamcinolone ointment (MYCOLOG) Apply 1 application topically 2 (two) times daily. 60 g 1  . sacubitril-valsartan (ENTRESTO) 97-103 MG Take 1 tablet by mouth 2 (two) times daily. 180 tablet 3  . spironolactone (ALDACTONE) 25 MG tablet Take 1 tablet (25 mg total) by mouth daily. Please call for an office visit [phone] 30 tablet 1   No facility-administered medications prior to visit.    No Known Allergies  ROS  Review of Systems  Constitutional: Negative for activity change and appetite change.  HENT: Negative.   Eyes: Negative.   Respiratory: Negative.   Cardiovascular: Negative.   Gastrointestinal: Negative.   Endocrine: Negative for polydipsia, polyphagia and polyuria.  Genitourinary: Negative.   Musculoskeletal: Negative.   Neurological: Negative.   Psychiatric/Behavioral: Negative.       Objective:    Physical Exam  Constitutional: He is oriented to person, place, and time. He appears well-developed and well-nourished.  HENT:  Head: Normocephalic.  Eyes: Pupils are equal, round, and reactive to light.  Cardiovascular: Normal rate and regular rhythm.  Pulmonary/Chest: Effort normal.  Abdominal: Soft. Bowel sounds are normal.  Musculoskeletal:        General: Normal range of motion.  Neurological: He is alert and oriented to person, place, and time.  Skin:     80% granulation, no drainage, mild erythema, mild tenderness    BP (!) 91/59 (BP Location: Left Arm, Patient Position: Sitting, Cuff Size: Large)   Pulse 74   Temp 98 F (36.7 C) (Oral)   Resp 18   Ht _0  (1.88 m)   Wt (!) 317 lb (143.8 kg)   SpO2 94%   BMI 40.70 kg/m  Wt Readings from Last 3 Encounters:  04/26/19 (!) 317 lb (143.8 kg)  04/05/19 (!) 314 lb (142.4 kg)  10/20/18 (!) 310 lb (140.6 kg)     Health Maintenance Due  Topic Date Due  . OPHTHALMOLOGY EXAM  Never done  . PNA vac Low Risk Adult (1 of 2 - PCV13) 01/09/2019  . URINE MICROALBUMIN  03/11/2019    There are no preventive care reminders to display for this patient.  Lab Results  Component Value Date   TSH 0.083 (L) 10/02/2017   Lab Results  Component Value Date   WBC 8.3 04/05/2019   HGB 16.8 04/05/2019   HCT 48.6 04/05/2019   MCV 89 04/05/2019   PLT 256 04/05/2019   Lab Results  Component Value Date   NA 138 04/05/2019   K 5.1 04/05/2019   CO2 20 04/05/2019   GLUCOSE 142 (H) 04/05/2019   BUN 18 04/05/2019   CREATININE  1.40 (H) 04/05/2019   BILITOT 0.7 01/22/2018   ALKPHOS 52 01/22/2018   AST 18 01/22/2018   ALT  25 01/22/2018   PROT 6.6 01/22/2018   ALBUMIN 3.5 01/22/2018   CALCIUM 10.2 04/05/2019   ANIONGAP 12 01/22/2018   GFR 89.09 05/15/2014   Lab Results  Component Value Date   CHOL 120 12/12/2016   Lab Results  Component Value Date   HDL 62 12/12/2016   Lab Results  Component Value Date   LDLCALC 42 12/12/2016   Lab Results  Component Value Date   TRIG 77 12/12/2016   Lab Results  Component Value Date   CHOLHDL 1.9 12/12/2016   Lab Results  Component Value Date   HGBA1C 7.3 (A) 04/05/2019      Assessment & Plan:   Problem List Items Addressed This Visit    None    Visit Diagnoses    Open leg wound, right, sequela    -  Primary   Relevant Medications   amoxicillin-clavulanate (AUGMENTIN) 875-125 MG tablet     Open leg wound, right, sequela Right lower extremity appears to be healing well.  80% granulation.  We will continue antibiotics for an additional 7 days.  Patient reminded to clean with saline, keep area clean, dry, and intact. - amoxicillin-clavulanate (AUGMENTIN) 875-125 MG tablet; Take 1 tablet by mouth 2 (two) times daily for 7 days.  Dispense: 14 tablet; Refill: 0   Follow-up: Return in about 3 months (around 07/27/2019) for diabetes.    Donia Pounds  APRN, MSN, FNP-C Patient [facility] [address] [place], [place] [address] [phone]

## 2019-05-12 ENCOUNTER — Other Ambulatory Visit: Payer: Self-pay | Admitting: Family Medicine

## 2019-05-12 DIAGNOSIS — E119 Type 2 diabetes mellitus without complications: Secondary | ICD-10-CM

## 2019-05-12 MED FILL — SPIRONOLACTONE 25 MG TABS: 25 | 30 days supply | Qty: 30 | Fill #1

## 2019-05-12 MED FILL — ELIQUIS 5 MG TABLET: 5 | 30 days supply | Qty: 60 | Fill #1

## 2019-05-12 MED FILL — ATORVASTATIN 80 MG TABLET: 80 | 30 days supply | Qty: 30 | Fill #2

## 2019-05-12 MED FILL — ENTRESTO 97 MG-103 MG TAB: 97-103 | 30 days supply | Qty: 60 | Fill #4

## 2019-05-12 MED FILL — METOPROLOL SUCCINATE ER 100: 100 | 30 days supply | Qty: 60 | Fill #2

## 2019-05-12 MED FILL — AMIODARONE HCL 200 MG TAB: 200 | 30 days supply | Qty: 30 | Fill #2

## 2019-05-12 MED FILL — metFORMIN HCL 1000 MG TABS: 1000 | 30 days supply | Qty: 60 | Fill #0

## 2019-05-26 ENCOUNTER — Ambulatory Visit: Payer: Medicare Other | Attending: Internal Medicine

## 2019-05-26 DIAGNOSIS — Z23 Encounter for immunization: Secondary | ICD-10-CM

## 2019-05-26 NOTE — Progress Notes (Signed)
   Covid-19 Vaccination Clinic  Name:  Dell Santellan    MRN: [id number] DOB: 12/31/1955  05/26/2019  Mr. Hillmer was observed post Covid-19 immunization for 15 minutes without incident. He was provided with Vaccine Information Sheet and instruction to access the V-Safe system.   Mr. Valls was instructed to call 911 with any severe reactions post vaccine: Marland Kitchen Difficulty breathing  . Swelling of face and throat  . A fast heartbeat  . A bad rash all over body  . Dizziness and weakness   Immunizations Administered    Name Date Dose VIS Date Route   Pfizer COVID-19 Vaccine 05/26/2019 10:27 AM 0.3 mL 01/21/2019 Intramuscular   Manufacturer: [place]   Lot: [id number]   [place]: [id number]

## 2019-05-30 ENCOUNTER — Ambulatory Visit: Payer: Medicare Other

## 2019-06-14 ENCOUNTER — Ambulatory Visit: Payer: Medicare Other | Attending: Internal Medicine

## 2019-06-14 DIAGNOSIS — Z23 Encounter for immunization: Secondary | ICD-10-CM

## 2019-06-14 NOTE — Progress Notes (Signed)
   Covid-19 Vaccination Clinic  Name:  Darius [name]    MRN: [id number] DOB: 25-Jul-1955  06/14/2019  [name] was observed post Covid-19 immunization for 15 minutes without incident. He was provided with Vaccine Information Sheet and instruction to access the V-Safe system.   [name] was instructed to call 911 with any severe reactions post vaccine: Marland Kitchen Difficulty breathing  . Swelling of face and throat  . A fast heartbeat  . A bad rash all over body  . Dizziness and weakness   Immunizations Administered    Name Date Dose VIS Date Route   Pfizer COVID-19 Vaccine 06/14/2019  2:04 PM 0.3 mL 04/06/2018 Intramuscular   Manufacturer: [place]   Lot: [id number]   [place]: [id number]

## 2019-06-28 ENCOUNTER — Other Ambulatory Visit: Payer: Self-pay | Admitting: Family Medicine

## 2019-06-28 ENCOUNTER — Other Ambulatory Visit (HOSPITAL_COMMUNITY): Payer: Self-pay | Admitting: Cardiology

## 2019-06-28 DIAGNOSIS — E119 Type 2 diabetes mellitus without complications: Secondary | ICD-10-CM

## 2019-06-28 MED FILL — ELIQUIS 5 MG TABLET: 5 | 30 days supply | Qty: 60 | Fill #0

## 2019-06-28 MED FILL — FARXIGA 5 MG TABLET: 5 | 90 days supply | Qty: 90 | Fill #1

## 2019-06-28 MED FILL — SPIRONOLACTONE 25 MG TABS: 25 | 30 days supply | Qty: 30 | Fill #0

## 2019-06-28 MED FILL — metFORMIN HCL 1000 MG TABS: 1000 | 30 days supply | Qty: 60 | Fill #0

## 2019-06-28 MED FILL — ENTRESTO 97 MG-103 MG TAB: 97-103 | 30 days supply | Qty: 60 | Fill #5

## 2019-06-28 MED FILL — AMIODARONE HCL 200 MG TAB: 200 | 30 days supply | Qty: 30 | Fill #0

## 2019-06-28 MED FILL — METOPROLOL SUCCINATE ER 100: 100 | 30 days supply | Qty: 60 | Fill #3

## 2019-06-28 MED FILL — ATORVASTATIN 80 MG TABLET: 80 | 30 days supply | Qty: 30 | Fill #3

## 2019-07-27 ENCOUNTER — Ambulatory Visit (INDEPENDENT_AMBULATORY_CARE_PROVIDER_SITE_OTHER): Payer: Medicare Other | Admitting: Nurse Practitioner

## 2019-07-27 ENCOUNTER — Other Ambulatory Visit: Payer: Self-pay

## 2019-07-27 ENCOUNTER — Encounter: Payer: Self-pay | Admitting: Nurse Practitioner

## 2019-07-27 VITALS — BP 117/80 | HR 60 | Temp 97.4°F | Ht 74.0 in | Wt 313.4 lb

## 2019-07-27 DIAGNOSIS — E11622 Type 2 diabetes mellitus with other skin ulcer: Secondary | ICD-10-CM

## 2019-07-27 DIAGNOSIS — I509 Heart failure, unspecified: Secondary | ICD-10-CM

## 2019-07-27 DIAGNOSIS — I10 Essential (primary) hypertension: Secondary | ICD-10-CM | POA: Diagnosis not present

## 2019-07-27 DIAGNOSIS — L97919 Non-pressure chronic ulcer of unspecified part of right lower leg with unspecified severity: Secondary | ICD-10-CM

## 2019-07-27 DIAGNOSIS — G4733 Obstructive sleep apnea (adult) (pediatric): Secondary | ICD-10-CM | POA: Diagnosis not present

## 2019-07-27 DIAGNOSIS — I4891 Unspecified atrial fibrillation: Secondary | ICD-10-CM

## 2019-07-27 LAB — POCT GLYCOSYLATED HEMOGLOBIN (HGB A1C)
HbA1c POC (<> result, manual entry): 5.3 % (ref 4.0–5.6)
HbA1c, POC (controlled diabetic range): 5.3 % (ref 0.0–7.0)
HbA1c, POC (prediabetic range): 5.3 % — AB (ref 5.7–6.4)
Hemoglobin A1C: 5.3 % (ref 4.0–5.6)

## 2019-07-27 NOTE — Progress Notes (Signed)
[address] [place], [place]  [address] Phone:  [phone]   Fax:  [phone]   Established Patient Office Visit  Subjective:  Patient ID: Darius [name], male    DOB: July 21, 1955  Age: 64 y.o. MRN: [id number]  CC:  Chief Complaint  Patient presents with   Follow-up    3 follow up     HPI Darius [name] presents for follow up. He  has a past medical history of Atrial fibrillation ([place]), Chronic diastolic heart failure ([place]), Diabetes mellitus, type 2 ([place]), cardiovascular stress test, Morbid obesity ([place]), and Sleep apnea.   Diabetes Mellitus Patient presents for follow up of diabetes. Current symptoms include: none. Patient denies foot ulcerations, hypoglycemia , nausea, polydipsia, polyuria and vomiting. Evaluation to date has included: fasting blood sugar, fasting lipid panel, hemoglobin A1C and microalbuminuria.  Home sugars: patient does not check sugars. Current treatment: Continued metformin which has been effective and Continued statin which has been effective. Last dilated eye exam: 2020.  Congestive Heart Failure Patient presents for re-evaluation of congestive heart failure. Patient's current complaints are dyspnea. He denies chest pain, exertional chest pressure/discomfort, irregular heart beat, palpitations and syncope. He states he is compliant most of the time with his medications. He states he is compliant most of the time with his diet.  Past Medical History:  Diagnosis Date   Atrial fibrillation ([place])    unable to tolerate versed and fentanyl sedation and required gen anesthesia for TEE;  s/p TEE-DCCV (failed);  Amiodarone started   Chronic diastolic heart failure ([place])    a. Echo 03/27/11: EF 50-55%, inferior hypokinesis, moderate LAE, mild RVE, normal pulmonary pressures. ;   b.  TEE (04/03/11): EF 40-45%, mild MR moderate LAE, no LAA clot, mild RAE;  c.  Echocardiogram (02/2013): EF 60-65%, Gr 2 DD, mildly dilated Ao root (Ao  root dimension 39 mm), MAC, mild LAE, normal RVF   Diabetes mellitus, type 2 ([place])    Hx of cardiovascular stress test    Lexiscan Myoview (02/2013): No ischemia or scar, EF 51%, low risk   Morbid obesity (H[facility])    Sleep apnea    a. sleep test 01/2013:  very severy OSA    Past Surgical History:  Procedure Laterality Date   CARDIOVERSION  04/01/2011   Procedure: CARDIOVERSION;  Surgeon: Lelon Perla, MD;  Location: [place];  Service: Cardiovascular;  Laterality: N/A;   CARDIOVERSION  04/03/2011   Procedure: CARDIOVERSION;  Surgeon: Lelon Perla, MD;  Location: [facility] ENDOSCOPY;  Service: Cardiovascular;  Laterality: N/A;   CARDIOVERSION  04/03/2011   Procedure: CARDIOVERSION;  Surgeon: Lelon Perla, MD;  Location: [place];  Service: Cardiovascular;  Laterality: N/A;   CARDIOVERSION N/A 06/15/2014   Procedure: CARDIOVERSION;  Surgeon: [name], MD;  Location: [facility] ENDOSCOPY;  Service: Cardiovascular;  Laterality: N/A;   CARDIOVERSION N/A 09/08/2016   Procedure: CARDIOVERSION;  Surgeon: [name], MD;  Location: [facility] ENDOSCOPY;  Service: Cardiovascular;  Laterality: N/A;   I & D EXTREMITY Right 09/19/2015   Procedure: IRRIGATION AND DEBRIDEMENT EXTREMITY;  Surgeon: Mcarthur Rossetti, MD;  Location: [place];  Service: Orthopedics;  Laterality: Right;   RIGHT/LEFT HEART CATH AND CORONARY ANGIOGRAPHY N/A 04/21/2016   Procedure: Right/Left Heart Cath and Coronary Angiography;  Surgeon: [name], MD;  Location: [place] CV LAB;  Service: Cardiovascular;  Laterality: N/A;   TEE WITHOUT CARDIOVERSION  04/01/2011   Procedure: TRANSESOPHAGEAL ECHOCARDIOGRAM (TEE);  Surgeon:  Lelon Perla, MD;  Location: [facility] ENDOSCOPY;  Service: Cardiovascular;  Laterality: N/A;   TEE WITHOUT CARDIOVERSION  04/03/2011   Procedure: TRANSESOPHAGEAL ECHOCARDIOGRAM (TEE);  Surgeon: Lelon Perla, MD;  Location: [facility] ENDOSCOPY;  Service: Cardiovascular;  Laterality: N/A;   TEE WITHOUT  CARDIOVERSION  04/03/2011   Procedure: TRANSESOPHAGEAL ECHOCARDIOGRAM (TEE);  Surgeon: Lelon Perla, MD;  Location: [place];  Service: Cardiovascular;  Laterality: N/A;   TEE WITHOUT CARDIOVERSION N/A 04/24/2016   Procedure: TRANSESOPHAGEAL ECHOCARDIOGRAM (TEE);  Surgeon: [name], MD;  Location: [place];  Service: Cardiovascular;  Laterality: N/A;   TEE WITHOUT CARDIOVERSION N/A 05/26/2016   Procedure: TRANSESOPHAGEAL ECHOCARDIOGRAM (TEE);  Surgeon: [name], MD;  Location: [place];  Service: Cardiovascular;  Laterality: N/A;   TEE WITHOUT CARDIOVERSION N/A 09/08/2016   Procedure: TRANSESOPHAGEAL ECHOCARDIOGRAM (TEE);  Surgeon: [name], MD;  Location: [facility] ENDOSCOPY;  Service: Cardiovascular;  Laterality: N/A;  d  Family History  Problem Relation Age of Onset   Diabetes Father        died in his 27s   Hypertension Father    Seizures Mother        died in her 50s   Hypertension Sister    Hypertension Paternal Grandfather    Heart attack Neg Hx    Stroke Neg Hx     Social History   Socioeconomic History   Marital status: Single    Spouse name: Not on file   Number of children: 0   Years of education: 12   Highest education level: Not on file  Occupational History   Occupation: unemployeed  Tobacco Use   Smoking status: Never Smoker   Smokeless tobacco: Never Used  Scientific laboratory technician Use: Never used  Substance and Sexual Activity   Alcohol use: Yes    Comment: some   Drug use: No   Sexual activity: Never  Other Topics Concern   Not on file  Social History Narrative   Admits that he eats poorly and eats a lot at work.      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   Social Determinants of Health   Financial Resource Strain:    Difficulty of Paying Living Expenses:   Food Insecurity:    Worried About Charity fundraiser in the Last Year:    Arboriculturist in the Last Year:   Transportation  Needs:    Film/video editor (Medical):    Lack of Transportation (Non-Medical):   Physical Activity:    Days of Exercise per Week:    Minutes of Exercise per Session:   Stress:    Feeling of Stress :   Social Connections:    Frequency of Communication with Friends and Family:    Frequency of Social Gatherings with Friends and Family:    Attends Religious Services:    Active Member of Clubs or Organizations:    Attends Music therapist:    Marital Status:   Intimate Partner Violence:    Fear of Current or Ex-Partner:    Emotionally Abused:    Physically Abused:    Sexually Abused:     Outpatient Medications Prior to Visit  Medication Sig Dispense Refill   amiodarone (PACERONE) 200 MG tablet Take 1 tablet (200 mg total) by mouth daily. Must be seen in office for further refills 30 tablet 0   apixaban (ELIQUIS) 5 MG TABS tablet Take 1 tablet (5 mg total)  by mouth 2 (two) times daily. Must be seen in office for further refills 60 tablet 0   atorvastatin (LIPITOR) 80 MG tablet TAKE 1 TABLET BY MOUTH AT BEDTIME. 30 tablet 6   docusate sodium (COLACE) 100 MG capsule Take 1 capsule (100 mg total) by mouth 2 (two) times daily. 10 capsule 0   FARXIGA 5 MG TABS tablet TAKE 1 TABLET BY MOUTH ONCE DAILY 90 tablet 1   furosemide (LASIX) 40 MG tablet TAKE 1 TABLET BY MOUTH ONCE DAILY 100 tablet 2   metFORMIN (GLUCOPHAGE) 1000 MG tablet TAKE 1 TABLET BY MOUTH TWICE A DAY WITH FOOD 60 tablet 0   metoprolol succinate (TOPROL-XL) 100 MG 24 hr tablet TAKE 1 TABLET BY MOUTH 2 TIMES DAILY. TAKE WITH OR IMMEDIATELY FOLLOWING A MEAL. 180 tablet 3   sacubitril-valsartan (ENTRESTO) 97-103 MG Take 1 tablet by mouth 2 (two) times daily. 180 tablet 3   spironolactone (ALDACTONE) 25 MG tablet Take 1 tablet (25 mg total) by mouth daily. Must be seen in office for further refills 30 tablet 0   ketoconazole (NIZORAL) 2 % shampoo Apply 1 application topically 2 (two) times  a week. (Patient not taking: Reported on 07/27/2019) 120 mL 0   nystatin-triamcinolone ointment (MYCOLOG) Apply 1 application topically 2 (two) times daily. (Patient not taking: Reported on 07/27/2019) 60 g 1   No facility-administered medications prior to visit.    No Known Allergies  ROS Review of Systems    Objective:    Physical Exam Constitutional:      Appearance: He is obese.  HENT:     Head: Normocephalic and atraumatic.  Cardiovascular:     Rate and Rhythm: Normal rate. Rhythm irregular.     Pulses: Normal pulses.     Heart sounds: Normal heart sounds.  Pulmonary:     Effort: Pulmonary effort is normal.     Breath sounds: Normal breath sounds.  Abdominal:     General: Bowel sounds are normal.     Palpations: Abdomen is soft.  Musculoskeletal:        General: Normal range of motion.     Cervical back: Normal range of motion.     Right lower leg: Edema present.     Left lower leg: Edema present.     Comments: Trace edema  Skin:    General: Skin is warm and dry.  Neurological:     Mental Status: He is alert and oriented to person, place, and time.  Psychiatric:        Mood and Affect: Mood normal.        Behavior: Behavior normal.        Thought Content: Thought content normal.        Judgment: Judgment normal.     BP 117/80    Pulse 60    Temp (!) 97.4 F (36.3 C) (Temporal)    Ht _0  (1.88 m)    Wt (!) 313 lb 6.4 oz (142.2 kg)    SpO2 98%    BMI 40.24 kg/m  Wt Readings from Last 3 Encounters:  07/27/19 (!) 313 lb 6.4 oz (142.2 kg)  04/26/19 (!) 317 lb (143.8 kg)  04/05/19 (!) 314 lb (142.4 kg)     Health Maintenance Due  Topic Date Due   OPHTHALMOLOGY EXAM  Never done   PNA vac Low Risk Adult (1 of 2 - PCV13) 01/09/2019    There are no preventive care reminders to display for this patient.  Lab  Results  Component Value Date   TSH 0.083 (L) 10/02/2017   Lab Results  Component Value Date   WBC 8.3 04/05/2019   HGB 16.8 04/05/2019   HCT  48.6 04/05/2019   MCV 89 04/05/2019   PLT 256 04/05/2019   Lab Results  Component Value Date   NA 135 07/27/2019   K 4.8 07/27/2019   CO2 20 04/05/2019   GLUCOSE 117 (H) 07/27/2019   BUN 14 07/27/2019   CREATININE 1.29 (H) 07/27/2019   BILITOT 0.4 07/27/2019   ALKPHOS 88 07/27/2019   AST 9 07/27/2019   ALT 25 01/22/2018   PROT 6.8 07/27/2019   ALBUMIN 3.9 07/27/2019   CALCIUM 9.3 07/27/2019   ANIONGAP 12 01/22/2018   GFR 89.09 05/15/2014   Lab Results  Component Value Date   CHOL 120 12/12/2016   Lab Results  Component Value Date   HDL 62 12/12/2016   Lab Results  Component Value Date   LDLCALC 42 12/12/2016   Lab Results  Component Value Date   TRIG 77 12/12/2016   Lab Results  Component Value Date   CHOLHDL 1.9 12/12/2016   Lab Results  Component Value Date   HGBA1C 5.3 07/27/2019   HGBA1C 5.3 07/27/2019   HGBA1C 5.3 (A) 07/27/2019   HGBA1C 5.3 07/27/2019      Assessment & Plan:   Problem List Items Addressed This Visit      Cardiovascular and Mediastinum   CHF (congestive heart failure) ([place]) - Encourage follow up with cardiology at least annually.  Continue with current regimen.  Continue to monitor diet Encourage exercise and weight loss     Respiratory   OSA - C-pap (Chronic) Encourage weight loss and continue use of CPAP as directed     Other   Obesity, Class III, BMI 40-49.9 (morbid obesity) (HCC) (Chronic) Lifestyle modifications    Other Visit Diagnoses    Essential hypertension     Continue with current regimen   Atrial fibrillation, unspecified type ([place])   Continue with Eliquis Encourage Cardiology follow up.      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 Metabolic Panel (12) Avoid high carb high sugar diet Encouraged exercise Continue with metformin Annual eye exam pending        No orders of the defined types were placed in this encounter.   Follow-up: Return in 3 months (on 10/27/2019) for next apt needs to be early for lipid panel fasting.    Vevelyn Francois, NP

## 2019-07-27 NOTE — Patient Instructions (Addendum)
Diabetes Mellitus and Exercise Exercising regularly is important for your overall health, especially when you have diabetes (diabetes mellitus). Exercising is not only about losing weight. It has many other health benefits, such as increasing muscle strength and bone density and reducing body fat and stress. This leads to improved fitness, flexibility, and endurance, all of which result in better overall health. Exercise has additional benefits for people with diabetes, including:  Reducing appetite.  Helping to lower and control blood glucose.  Lowering blood pressure.  Helping to control amounts of fatty substances (lipids) in the blood, such as cholesterol and triglycerides.  Helping the body to respond better to insulin (improving insulin sensitivity).  Reducing how much insulin the body needs.  Decreasing the risk for heart disease by: ? Lowering cholesterol and triglyceride levels. ? Increasing the levels of good cholesterol. ? Lowering blood glucose levels. What is my activity plan? Your health care provider or certified diabetes educator can help you make a plan for the type and frequency of exercise (activity plan) that works for you. Make sure that you:  Do at least 150 minutes of moderate-intensity or vigorous-intensity exercise each week. This could be brisk walking, biking, or water aerobics. ? Do stretching and strength exercises, such as yoga or weightlifting, at least 2 times a week. ? Spread out your activity over at least 3 days of the week.  Get some form of physical activity every day. ? Do not go more than 2 days in a row without some kind of physical activity. ? Avoid being inactive for more than 30 minutes at a time. Take frequent breaks to walk or stretch.  Choose a type of exercise or activity that you enjoy, and set realistic goals.  Start slowly, and gradually increase the intensity of your exercise over time. What do I need to know about managing my  diabetes?   Check your blood glucose before and after exercising. ? If your blood glucose is 240 mg/dL (13.3 mmol/L) or higher before you exercise, check your urine for ketones. If you have ketones in your urine, do not exercise until your blood glucose returns to normal. ? If your blood glucose is 100 mg/dL (5.6 mmol/L) or lower, eat a snack containing 15-20 grams of carbohydrate. Check your blood glucose 15 minutes after the snack to make sure that your level is above 100 mg/dL (5.6 mmol/L) before you start your exercise.  Know the symptoms of low blood glucose (hypoglycemia) and how to treat it. Your risk for hypoglycemia increases during and after exercise. Common symptoms of hypoglycemia can include: ? Hunger. ? Anxiety. ? Sweating and feeling clammy. ? Confusion. ? Dizziness or feeling light-headed. ? Increased heart rate or palpitations. ? Blurry vision. ? Tingling or numbness around the mouth, lips, or tongue. ? Tremors or shakes. ? Irritability.  Keep a rapid-acting carbohydrate snack available before, during, and after exercise to help prevent or treat hypoglycemia.  Avoid injecting insulin into areas of the body that are going to be exercised. For example, avoid injecting insulin into: ? The arms, when playing tennis. ? The legs, when jogging.  Keep records of your exercise habits. Doing this can help you and your health care provider adjust your diabetes management plan as needed. Write down: ? Food that you eat before and after you exercise. ? Blood glucose levels before and after you exercise. ? The type and amount of exercise you have done. ? When your insulin is expected to peak, if you use   insulin. Avoid exercising at times when your insulin is peaking.  When you start a new exercise or activity, work with your health care provider to make sure the activity is safe for you, and to adjust your insulin, medicines, or food intake as needed.  Drink plenty of water while  you exercise to prevent dehydration or heat stroke. Drink enough fluid to keep your urine clear or pale yellow. Summary  Exercising regularly is important for your overall health, especially when you have diabetes (diabetes mellitus).  Exercising has many health benefits, such as increasing muscle strength and bone density and reducing body fat and stress.  Your health care provider or certified diabetes educator can help you make a plan for the type and frequency of exercise (activity plan) that works for you.  When you start a new exercise or activity, work with your health care provider to make sure the activity is safe for you, and to adjust your insulin, medicines, or food intake as needed. This information is not intended to replace advice given to you by your health care provider. Make sure you discuss any questions you have with your health care provider. Document Revised: 08/21/2016 Document Reviewed: 07/09/2015 Elsevier Patient Education  2020 [employer]  

## 2019-07-28 LAB — COMP. METABOLIC PANEL (12)
AST: 9 IU/L (ref 0–40)
Albumin/Globulin Ratio: 1.3 (ref 1.2–2.2)
Albumin: 3.9 g/dL (ref 3.8–4.8)
Alkaline Phosphatase: 88 IU/L (ref 48–121)
BUN/Creatinine Ratio: 11 (ref 10–24)
BUN: 14 mg/dL (ref 8–27)
Bilirubin Total: 0.4 mg/dL (ref 0.0–1.2)
Calcium: 9.3 mg/dL (ref 8.6–10.2)
Chloride: 101 mmol/L (ref 96–106)
Creatinine, Ser: 1.29 mg/dL — ABNORMAL HIGH (ref 0.76–1.27)
GFR calc Af Amer: 67 mL/min/{1.73_m2} (ref >59)
GFR calc non Af Amer: 58 mL/min/{1.73_m2} — ABNORMAL LOW (ref >59)
Globulin, Total: 2.9 g/dL (ref 1.5–4.5)
Glucose: 117 mg/dL — ABNORMAL HIGH (ref 65–99)
Potassium: 4.8 mmol/L (ref 3.5–5.2)
Sodium: 135 mmol/L (ref 134–144)
Total Protein: 6.8 g/dL (ref 6.0–8.5)

## 2019-07-28 LAB — MICROALBUMIN / CREATININE URINE RATIO
Creatinine, Urine: 99 mg/dL
Microalb/Creat Ratio: 20 mg/g creat (ref 0–29)
Microalbumin, Urine: 20.2 ug/mL

## 2019-08-02 ENCOUNTER — Other Ambulatory Visit: Payer: Self-pay | Admitting: Family Medicine

## 2019-08-02 ENCOUNTER — Other Ambulatory Visit (HOSPITAL_COMMUNITY): Payer: Self-pay | Admitting: Cardiology

## 2019-08-02 DIAGNOSIS — E1100 Type 2 diabetes mellitus with hyperosmolarity without nonketotic hyperglycemic-hyperosmolar coma (NKHHC): Secondary | ICD-10-CM

## 2019-08-02 DIAGNOSIS — E119 Type 2 diabetes mellitus without complications: Secondary | ICD-10-CM

## 2019-08-02 MED FILL — ELIQUIS 5 MG TABLET: 5 | 30 days supply | Qty: 60 | Fill #0

## 2019-08-02 MED FILL — METOPROLOL SUCCINATE ER 100: 100 | 30 days supply | Qty: 60 | Fill #4

## 2019-08-02 MED FILL — ENTRESTO 97 MG-103 MG TAB: 97-103 | 30 days supply | Qty: 60 | Fill #6

## 2019-08-02 MED FILL — AMIODARONE HCL 200 MG TAB: 200 | 30 days supply | Qty: 30 | Fill #0

## 2019-08-02 MED FILL — SPIRONOLACTONE 25 MG TABS: 25 | 30 days supply | Qty: 30 | Fill #0

## 2019-08-02 MED FILL — ATORVASTATIN 80 MG TABLET: 80 | 30 days supply | Qty: 30 | Fill #4

## 2019-08-02 MED FILL — metFORMIN HCL 1000 MG TABS: 1000 | 30 days supply | Qty: 60 | Fill #0

## 2019-09-02 DIAGNOSIS — E119 Type 2 diabetes mellitus without complications: Secondary | ICD-10-CM | POA: Diagnosis not present

## 2019-09-02 DIAGNOSIS — H2513 Age-related nuclear cataract, bilateral: Secondary | ICD-10-CM | POA: Diagnosis not present

## 2019-09-02 DIAGNOSIS — H35013 Changes in retinal vascular appearance, bilateral: Secondary | ICD-10-CM | POA: Diagnosis not present

## 2019-09-02 DIAGNOSIS — D3132 Benign neoplasm of left choroid: Secondary | ICD-10-CM | POA: Diagnosis not present

## 2019-09-05 ENCOUNTER — Other Ambulatory Visit (HOSPITAL_COMMUNITY): Payer: Self-pay | Admitting: Cardiology

## 2019-09-05 MED FILL — metFORMIN HCL 1000 MG TABS: 1000 | 30 days supply | Qty: 60 | Fill #1

## 2019-09-05 MED FILL — METOPROLOL SUCCINATE ER 100: 100 | 30 days supply | Qty: 60 | Fill #5

## 2019-09-05 MED FILL — ENTRESTO 97 MG-103 MG TAB: 97-103 | 30 days supply | Qty: 60 | Fill #7

## 2019-09-05 MED FILL — ATORVASTATIN 80 MG TABLET: 80 | 30 days supply | Qty: 30 | Fill #5

## 2019-09-07 ENCOUNTER — Other Ambulatory Visit (HOSPITAL_COMMUNITY): Payer: Self-pay | Admitting: Cardiology

## 2019-09-07 MED FILL — AMIODARONE HCL 200 MG TAB: 200 | 30 days supply | Qty: 30 | Fill #0

## 2019-10-04 MED FILL — ENTRESTO 97 MG-103 MG TAB: 97-103 | 30 days supply | Qty: 60 | Fill #8

## 2019-10-04 MED FILL — metFORMIN HCL 1000 MG TABS: 1000 | 30 days supply | Qty: 60 | Fill #2

## 2019-10-04 MED FILL — METOPROLOL SUCCINATE ER 100: 100 | 30 days supply | Qty: 60 | Fill #6

## 2019-10-04 MED FILL — ATORVASTATIN 80 MG TABLET: 80 | 30 days supply | Qty: 30 | Fill #6

## 2019-10-19 ENCOUNTER — Ambulatory Visit (HOSPITAL_COMMUNITY)
Admission: RE | Admit: 2019-10-19 | Discharge: 2019-10-19 | Disposition: A | Discharge status: Home / Self Care | Payer: Medicare HMO | Source: Ambulatory Visit | Attending: Adult Health | Admitting: Adult Health

## 2019-10-19 ENCOUNTER — Other Ambulatory Visit: Payer: Self-pay

## 2019-10-19 ENCOUNTER — Encounter (HOSPITAL_COMMUNITY): Payer: Self-pay

## 2019-10-19 VITALS — BP 122/86 | HR 88 | Wt 311.0 lb

## 2019-10-19 DIAGNOSIS — I2510 Atherosclerotic heart disease of native coronary artery without angina pectoris: Secondary | ICD-10-CM | POA: Insufficient documentation

## 2019-10-19 DIAGNOSIS — E785 Hyperlipidemia, unspecified: Secondary | ICD-10-CM | POA: Diagnosis not present

## 2019-10-19 DIAGNOSIS — E119 Type 2 diabetes mellitus without complications: Secondary | ICD-10-CM | POA: Insufficient documentation

## 2019-10-19 DIAGNOSIS — I5022 Chronic systolic (congestive) heart failure: Secondary | ICD-10-CM | POA: Insufficient documentation

## 2019-10-19 DIAGNOSIS — Z79899 Other long term (current) drug therapy: Secondary | ICD-10-CM | POA: Diagnosis not present

## 2019-10-19 DIAGNOSIS — I428 Other cardiomyopathies: Secondary | ICD-10-CM | POA: Diagnosis not present

## 2019-10-19 DIAGNOSIS — Z86718 Personal history of other venous thrombosis and embolism: Secondary | ICD-10-CM | POA: Insufficient documentation

## 2019-10-19 DIAGNOSIS — G4733 Obstructive sleep apnea (adult) (pediatric): Secondary | ICD-10-CM | POA: Diagnosis not present

## 2019-10-19 DIAGNOSIS — I509 Heart failure, unspecified: Secondary | ICD-10-CM | POA: Diagnosis not present

## 2019-10-19 DIAGNOSIS — Z7901 Long term (current) use of anticoagulants: Secondary | ICD-10-CM | POA: Insufficient documentation

## 2019-10-19 DIAGNOSIS — Z9119 Patient's noncompliance with other medical treatment and regimen: Secondary | ICD-10-CM | POA: Diagnosis not present

## 2019-10-19 DIAGNOSIS — Z833 Family history of diabetes mellitus: Secondary | ICD-10-CM | POA: Insufficient documentation

## 2019-10-19 DIAGNOSIS — Z7984 Long term (current) use of oral hypoglycemic drugs: Secondary | ICD-10-CM | POA: Insufficient documentation

## 2019-10-19 DIAGNOSIS — Z6839 Body mass index (BMI) 39.0-39.9, adult: Secondary | ICD-10-CM | POA: Insufficient documentation

## 2019-10-19 DIAGNOSIS — I4819 Other persistent atrial fibrillation: Secondary | ICD-10-CM | POA: Insufficient documentation

## 2019-10-19 DIAGNOSIS — Z8249 Family history of ischemic heart disease and other diseases of the circulatory system: Secondary | ICD-10-CM | POA: Insufficient documentation

## 2019-10-19 NOTE — Patient Instructions (Signed)
STOP amiodarone  Follow up and echo with Dr.McLean in 3 months

## 2019-10-19 NOTE — Progress Notes (Signed)
Advanced Heart Failure Clinic Note   PCP: Colin Benton, DO  HF Cardiologist:  Dr. [name]  HPI:  Darius [name] is a 65 y.o. male with DM, paroxysmal Afib, history of left atrial appendage thrombus (on Eliquis), OSA, chronic systolic CHF, NICM (EF 02%) felt to be tachycardia mediated.   Admitted 06/13/25-0/62/37 for acute systolic CHF and atrial fibrillation with RVR. Had a R/L heart cath on 04/21/16, no CAD, right heart pressures elevated, low output. Echo showed EF 15%. Plan was to undergo TEE/DC-CV, however he was found to have a left atrial appendage thrombus and had only started Eliquis 5 days prior. He was discharged with plans for TEE/DC-CV in about a month. He was started on Entresto 24/26 mg BID during admission. Discharged home on Lasix 40 mg daily, digoxin 0.128m, and metoprolol XL 50 mg BID. Discharge 280 pounds.   Repeat TEE done in 4/18 for possible cardioversion.  EF remained 25-30%.  He still had a small, amorphous LA appendage thrombus (smaller than prior but still present).  He admitted to having missed some Eliquis doses.    He finally had TEE-guided DCCV in 7/18 with conversion to NSR.  TEE showed EF 40-45%.   On 10/13/17 he was evaluated by Dr CCurt Bearsfor possible A fib ablation. He was not a candidate at that time due to compliance issues with CPAP.   He saw Dr TRadford Paxon 12/17/17 for OSA. He is being set up for another sleep study.   Today he returns for HF follow up. He has not been seen in the HF clinic since 2019. Said he came back to get refills today. Overall feeling fine. SOB when he is doing things outside. He has no problems walking around the grocery store.  Denies PND/Orthopnea. Appetite ok. No fever or chills. He has not been weighing.  Taking all medications once a day.  Says he never takes his meds twice a day.   Cardiac Studies  RHC/LHC 04/21/2016  RA mean 14 RV 45/15 PA 50/28, mean 36 PCWP mean 22 LV 104/20 AO 101/75 Oxygen saturations: PA 59% AO 92% Cardiac  Output (Fick) 4.85  Cardiac Index (Fick) 1.89 PVR 2.9 WU  - TEE: 04/2016 with LAA thrombus.  - TEE: 4/18 with EF 25-30%, diffuse hypokinesis, RV mildly dilated with moderately decreased systolic function, amorphous thrombus LA appendage (improved compared to 3/18).  -TEE 09/08/2016 EF 40-45% No thrombus. Successful DC-CV  - ECHO 04/24/2016 EF 15% Peak PA pressure 38 mm hg  -ECHO 09/11/2017: EF 55-60% RV normal.   Review of systems complete and found to be negative unless listed in HPI.   SH:  Social History   Socioeconomic History  . Marital status: Single    Spouse name: Not on file  . Number of children: 0  . Years of education: 127 . Highest education level: Not on file  Occupational History  . Occupation: unemployeed  Tobacco Use  . Smoking status: Never Smoker  . Smokeless tobacco: Never Used  Vaping Use  . Vaping Use: Never used  Substance and Sexual Activity  . Alcohol use: Yes    Comment: some  . Drug use: No  . Sexual activity: Never  Other Topics Concern  . Not on file  Social History Narrative   Admits that he eats poorly and eats a lot at work.      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   Social Determinants of Health   Financial Resource Strain:   .  Difficulty of Paying Living Expenses: Not on file  Food Insecurity:   . Worried About Charity fundraiser in the Last Year: Not on file  . Ran Out of Food in the Last Year: Not on file  Transportation Needs:   . Lack of Transportation (Medical): Not on file  . Lack of Transportation (Non-Medical): Not on file  Physical Activity:   . Days of Exercise per Week: Not on file  . Minutes of Exercise per Session: Not on file  Stress:   . Feeling of Stress : Not on file  Social Connections:   . Frequency of Communication with Friends and Family: Not on file  . Frequency of Social Gatherings with Friends and Family: Not on file  . Attends Religious Services: Not on file  . Active Member of  Clubs or Organizations: Not on file  . Attends Archivist Meetings: Not on file  . Marital Status: Not on file  Intimate Partner Violence:   . Fear of Current or Ex-Partner: Not on file  . Emotionally Abused: Not on file  . Physically Abused: Not on file  . Sexually Abused: Not on file    FH:  Family History  Problem Relation Age of Onset  . Diabetes Father        died in his 45s  . Hypertension Father   . Seizures Mother        died in her 73s  . Hypertension Sister   . Hypertension Paternal Grandfather   . Heart attack Neg Hx   . Stroke Neg Hx     Past Medical History:  Diagnosis Date  . Atrial fibrillation ([place])    unable to tolerate versed and fentanyl sedation and required gen anesthesia for TEE;  s/p TEE-DCCV (failed);  Amiodarone started  . Chronic diastolic heart failure ([place])    a. Echo 03/27/11: EF 50-55%, inferior hypokinesis, moderate LAE, mild RVE, normal pulmonary pressures. ;   b.  TEE (04/03/11): EF 40-45%, mild MR moderate LAE, no LAA clot, mild RAE;  c.  Echocardiogram (02/2013): EF 60-65%, Gr 2 DD, mildly dilated Ao root (Ao root dimension 39 mm), MAC, mild LAE, normal RVF  . Diabetes mellitus, type 2 ([place])   . Hx of cardiovascular stress test    Lexiscan Myoview (02/2013): No ischemia or scar, EF 51%, low risk  . Morbid obesity ([place])   . Sleep apnea    a. sleep test 01/2013:  very severy OSA    Current Outpatient Medications  Medication Sig Dispense Refill  . amiodarone (PACERONE) 200 MG tablet TAKE 1 TABLET (200 MG TOTAL) BY MOUTH DAILY. LAST REFILL WITHOUT OFFICE VISIT. PLEASE CALL [phone] 30 tablet 0  . apixaban (ELIQUIS) 5 MG TABS tablet Take 1 tablet (5 mg total) by mouth 2 (two) times daily. Last refill without office visit. Please call [phone] 60 tablet 0  . atorvastatin (LIPITOR) 80 MG tablet TAKE 1 TABLET BY MOUTH AT BEDTIME. 30 tablet 6  . docusate sodium (COLACE) 100 MG capsule Take 1 capsule (100 mg total) by mouth 2 (two)  times daily. 10 capsule 0  . FARXIGA 5 MG TABS tablet TAKE 1 TABLET BY MOUTH ONCE DAILY 90 tablet 5  . furosemide (LASIX) 40 MG tablet TAKE 1 TABLET BY MOUTH ONCE DAILY 100 tablet 2  . metFORMIN (GLUCOPHAGE) 1000 MG tablet TAKE 1 TABLET BY MOUTH TWICE A DAY WITH FOOD 60 tablet 5  . metoprolol succinate (TOPROL-XL) 100 MG 24 hr tablet TAKE  1 TABLET BY MOUTH 2 TIMES DAILY. TAKE WITH OR IMMEDIATELY FOLLOWING A MEAL. 180 tablet 3  . sacubitril-valsartan (ENTRESTO) 97-103 MG Take 1 tablet by mouth 2 (two) times daily. 180 tablet 3  . spironolactone (ALDACTONE) 25 MG tablet Take 1 tablet (25 mg total) by mouth daily. Last refill without office visit. Please call [phone] 30 tablet 0   No current facility-administered medications for this encounter.    Vitals:   10/19/19 1438  BP: 122/86  Pulse: 88  SpO2: 95%  Weight: (!) 141.1 kg (311 lb)   Wt Readings from Last 3 Encounters:  10/19/19 (!) 141.1 kg (311 lb)  07/27/19 (!) 142.2 kg (313 lb 6.4 oz)  04/26/19 (!) 143.8 kg (317 lb)    PHYSICAL EXAM: General:  Well appearing. No resp difficulty HEENT: normal Neck: supple. no JVD. Carotids 2+ bilat; no bruits. No lymphadenopathy or thryomegaly appreciated. Cor: PMI nondisplaced. Irregular rate & rhythm. No rubs, gallops or murmurs. Lungs: clear Abdomen: obese, soft, nontender, nondistended. No hepatosplenomegaly. No bruits or masses. Good bowel sounds. Extremities: no cyanosis, clubbing, rash, edema Neuro: alert & orientedx3, cranial nerves grossly intact. moves all 4 extremities w/o difficulty. Affect pleasant  EKG: A fib 87 bpm   ASSESSMENT & PLAN:  1. Chronic systolic CHF: Had [facility] 02/4707 showing minimal CAD.  Nonischemic cardiomyopathy, possibly tachycardia-mediated in the setting of rapid atrial fibrillation.   Echo 04/2016 EF 15% but improved to 40-45% on TEE in 7/18. On 8/2 2019 EF had normalized to 55-60%.    - NYHA I. Volume status stable. Continue lasix 40 mg daily  - Continue  Entresto 97/103 bid.     - Continue spironolactone 25 mg daily.  - Continue farxiga daily.  2. Atrial fibrillation:  DCCV in 7/18. 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   Persistent A fib. He is not been taking meds twice a day. He has been set up for cardioversion but  - Stop amiodarone given persistent A fib. Rate controlled.  - He does not take medications as recommended.   - Encouraged to take apixaban 5 mg bid because he has only been taking once a day.   - He has been evaluated by EP for ablation and was not a candidate due to compliance.  3. HLD: Continue statin. No change.  4. OSA: No longer using CPAP.  5. Obesity Body mass index is 39.93 kg/m.  Follow up with [name] in 3-4 months with an ECHO. IF EF remains normal would refer to back to general cardiology.   Darius Baisch NP-C  10/19/2019

## 2019-10-20 ENCOUNTER — Ambulatory Visit (INDEPENDENT_AMBULATORY_CARE_PROVIDER_SITE_OTHER): Payer: Medicare HMO | Admitting: Nurse Practitioner

## 2019-10-20 ENCOUNTER — Encounter: Payer: Self-pay | Admitting: Nurse Practitioner

## 2019-10-20 VITALS — BP 105/70 | HR 72 | Temp 97.9°F | Resp 16 | Ht 74.0 in | Wt 309.0 lb

## 2019-10-20 DIAGNOSIS — R8290 Unspecified abnormal findings in urine: Secondary | ICD-10-CM | POA: Diagnosis not present

## 2019-10-20 DIAGNOSIS — Z1159 Encounter for screening for other viral diseases: Secondary | ICD-10-CM | POA: Diagnosis not present

## 2019-10-20 DIAGNOSIS — E1100 Type 2 diabetes mellitus with hyperosmolarity without nonketotic hyperglycemic-hyperosmolar coma (NKHHC): Secondary | ICD-10-CM

## 2019-10-20 DIAGNOSIS — H6121 Impacted cerumen, right ear: Secondary | ICD-10-CM

## 2019-10-20 DIAGNOSIS — Z9119 Patient's noncompliance with other medical treatment and regimen: Secondary | ICD-10-CM | POA: Diagnosis not present

## 2019-10-20 DIAGNOSIS — L97919 Non-pressure chronic ulcer of unspecified part of right lower leg with unspecified severity: Secondary | ICD-10-CM

## 2019-10-20 DIAGNOSIS — E11622 Type 2 diabetes mellitus with other skin ulcer: Secondary | ICD-10-CM

## 2019-10-20 DIAGNOSIS — G4733 Obstructive sleep apnea (adult) (pediatric): Secondary | ICD-10-CM | POA: Diagnosis not present

## 2019-10-20 DIAGNOSIS — Z91199 Patient's noncompliance with other medical treatment and regimen due to unspecified reason: Secondary | ICD-10-CM

## 2019-10-20 DIAGNOSIS — I509 Heart failure, unspecified: Secondary | ICD-10-CM | POA: Diagnosis not present

## 2019-10-20 DIAGNOSIS — I4891 Unspecified atrial fibrillation: Secondary | ICD-10-CM | POA: Diagnosis not present

## 2019-10-20 DIAGNOSIS — R310 Gross hematuria: Secondary | ICD-10-CM

## 2019-10-20 DIAGNOSIS — I10 Essential (primary) hypertension: Secondary | ICD-10-CM

## 2019-10-20 LAB — POCT URINALYSIS DIPSTICK
Bilirubin, UA: NEGATIVE
Glucose, UA: POSITIVE — AB
Ketones, UA: NEGATIVE
Leukocytes, UA: NEGATIVE
Nitrite, UA: NEGATIVE
Protein, UA: POSITIVE — AB
Spec Grav, UA: 1.025 (ref 1.010–1.025)
Urobilinogen, UA: 0.2 E.U./dL
pH, UA: 5.5 (ref 5.0–8.0)

## 2019-10-20 LAB — POCT GLYCOSYLATED HEMOGLOBIN (HGB A1C)
HbA1c POC (<> result, manual entry): 6.7 % (ref 4.0–5.6)
HbA1c, POC (controlled diabetic range): 6.7 % (ref 0.0–7.0)
HbA1c, POC (prediabetic range): 6.7 % — AB (ref 5.7–6.4)
Hemoglobin A1C: 6.7 % — AB (ref 4.0–5.6)

## 2019-10-20 LAB — GLUCOSE, POCT (MANUAL RESULT ENTRY): POC Glucose: 133 mg/dl — AB (ref 70–99)

## 2019-10-20 MED ORDER — FLUPHENAZINE DECANOATE 25 MG/ML IJ SOLN
5.0000 mg | Freq: Once | INTRAMUSCULAR | Status: AC
  Administered 2019-10-20: 5 mg via INTRAMUSCULAR

## 2019-10-20 NOTE — Progress Notes (Signed)
[facility] [place], [place]  [address] Phone:  [phone]   Fax:  [phone]   Established Patient Office Visit  Subjective:  Patient ID: Darius [name], male    DOB: 11-19-1955  Age: 64 y.o. MRN: [id number]  CC:  Chief Complaint  Patient presents with   Follow-up    Pt states he wants to discuss his medication. Pt states he notice blood in his urine.X53month. Pt states he doesn't see the blood when he miss his medicine.    HPI MEverett Ehrlerpresents for follow up. He  has a past medical history of Atrial fibrillation ([place], Chronic diastolic heart failure ([place], Diabetes mellitus, type 2 ([place], cardiovascular stress test, Morbid obesity ([place], and Sleep apnea.   Hematuria Patient complains of gross hematuria. Onset of hematuria was a few weeks ago and was sudden in onset. There is not a history of nephrolithiasis. There is not a history of urologic trauma. Other urologic symptoms include none. Patient admits to history of no risk factors for cancer. Patient denies history of Agent Orange exposure, chronic Foley catheter, GU surgeries, occupational exposure, sexually transmitted diseases, tobacco use, trauma and urolithiasis. Prior workup has been Labs (BMP or CMP), UA'S. He is currently on Eliquis.   Hypertension Patient is here for follow-up of elevated blood pressure. He is not exercising and is not adherent to a low-salt diet. Blood pressure is not monitored at home. Cardiac symptoms: none. Patient denies chest pain, dyspnea, exertional chest pressure/discomfort, irregular heart beat, lower extremity edema, palpitations and syncope. Cardiovascular risk factors: advanced age (older than 51for men, 615for women), diabetes mellitus, dyslipidemia, hypertension, male gender, obesity (BMI >= 30 kg/m2) and sedentary lifestyle. Use of agents associated with hypertension: none. History of target organ damage: angina/ prior myocardial infarction and heart  failure.  Diabetes Mellitus Patient presents for follow up of diabetes. Current symptoms include: none. Symptoms have stabilized. Patient denies hypoglycemia , nausea, polydipsia, polyuria, visual disturbances, vomiting and weight loss. Evaluation to date has included: fasting blood sugar, fasting lipid panel, hemoglobin A1C and microalbuminuria.  Home sugars: patient does not check sugars. Current treatment: Continued metformin which has been somewhat effective, Continued ACE inhibitor/ARB which has been effective and SGLT2.  Past Medical History:  Diagnosis Date   Atrial fibrillation ([place]    unable to tolerate versed and fentanyl sedation and required gen anesthesia for TEE;  s/p TEE-DCCV (failed);  Amiodarone started   Chronic diastolic heart failure ([place]    a. Echo 03/27/11: EF 50-55%, inferior hypokinesis, moderate LAE, mild RVE, normal pulmonary pressures. ;   b.  TEE (04/03/11): EF 40-45%, mild MR moderate LAE, no LAA clot, mild RAE;  c.  Echocardiogram (02/2013): EF 60-65%, Gr 2 DD, mildly dilated Ao root (Ao root dimension 39 mm), MAC, mild LAE, normal RVF   Diabetes mellitus, type 2 ([place]Grand [name]    Hx of cardiovascular stress test    Lexiscan Myoview (02/2013): No ischemia or scar, EF 51%, low risk   Morbid obesity (HCC)    Sleep apnea    a. sleep test 01/2013:  very severy OSA    Past Surgical History:  Procedure Laterality Date   CARDIOVERSION  04/01/2011   Procedure: CARDIOVERSION;  Surgeon: BLelon Perla MD;  [facility]  Service: Cardiovascular;  Laterality: N/A;   CARDIOVERSION  04/03/2011   Procedure: CARDIOVERSION;  Surgeon: BLelon Perla MD;  [facility];  Service: Cardiovascular;  Laterality: N/A;  CARDIOVERSION  04/03/2011   Procedure: CARDIOVERSION;  Surgeon: Lelon Perla, MD;  Location: [place];  Service: Cardiovascular;  Laterality: N/A;   CARDIOVERSION N/A 06/15/2014   Procedure: CARDIOVERSION;  Surgeon: [name], MD;  Location:  [place];  Service: Cardiovascular;  Laterality: N/A;   CARDIOVERSION N/A 09/08/2016   Procedure: CARDIOVERSION;  Surgeon: [name], MD;  Location: [facility] ENDOSCOPY;  Service: Cardiovascular;  Laterality: N/A;   I & D EXTREMITY Right 09/19/2015   Procedure: IRRIGATION AND DEBRIDEMENT EXTREMITY;  Surgeon: Mcarthur Rossetti, MD;  Location: [place];  Service: Orthopedics;  Laterality: Right;   RIGHT/LEFT HEART CATH AND CORONARY ANGIOGRAPHY N/A 04/21/2016   Procedure: Right/Left Heart Cath and Coronary Angiography;  Surgeon: [name], MD;  Location: [place] CV LAB;  Service: Cardiovascular;  Laterality: N/A;   TEE WITHOUT CARDIOVERSION  04/01/2011   Procedure: TRANSESOPHAGEAL ECHOCARDIOGRAM (TEE);  Surgeon: Lelon Perla, MD;  [facility];  Service: Cardiovascular;  Laterality: N/A;   TEE WITHOUT CARDIOVERSION  04/03/2011   Procedure: TRANSESOPHAGEAL ECHOCARDIOGRAM (TEE);  Surgeon: Lelon Perla, MD;  [facility];  Service: Cardiovascular;  Laterality: N/A;   TEE WITHOUT CARDIOVERSION  04/03/2011   Procedure: TRANSESOPHAGEAL ECHOCARDIOGRAM (TEE);  Surgeon: Lelon Perla, MD;  Location: [place];  Service: Cardiovascular;  Laterality: N/A;   TEE WITHOUT CARDIOVERSION N/A 04/24/2016   Procedure: TRANSESOPHAGEAL ECHOCARDIOGRAM (TEE);  Surgeon: [name], MD;  Location: [place];  Service: Cardiovascular;  Laterality: N/A;   TEE WITHOUT CARDIOVERSION N/A 05/26/2016   Procedure: TRANSESOPHAGEAL ECHOCARDIOGRAM (TEE);  Surgeon: [name], MD;  Location: [place];  Service: Cardiovascular;  Laterality: N/A;   TEE WITHOUT CARDIOVERSION N/A 09/08/2016   Procedure: TRANSESOPHAGEAL ECHOCARDIOGRAM (TEE);  Surgeon: [name], MD;  Location: [facility] ENDOSCOPY;  Service: Cardiovascular;  Laterality: N/A;    Family History  Problem Relation Age of Onset   Diabetes Father        died in his 80s   Hypertension Father    Seizures Mother         died in her 46s   Hypertension Sister    Hypertension Paternal Grandfather    Heart attack Neg Hx    Stroke Neg Hx     Social History   Socioeconomic History   Marital status: Single    Spouse name: Not on file   Number of children: 0   Years of education: 12   Highest education level: Not on file  Occupational History   Occupation: unemployeed  Tobacco Use   Smoking status: Never Smoker   Smokeless tobacco: Never Used  Scientific laboratory technician Use: Never used  Substance and Sexual Activity   Alcohol use: Yes    Comment: some   Drug use: No   Sexual activity: Never  Other Topics Concern   Not on file  Social History Narrative   Admits that he eats poorly and eats a lot at work.      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   Social Determinants of Health   Financial Resource Strain:    Difficulty of Paying Living Expenses: Not on file  Food Insecurity:    Worried About Charity fundraiser in the Last Year: Not on file   [employer] of Food in the Last Year: Not on file  Transportation Needs:    Lack of Transportation (Medical): Not on file   Lack of Transportation (Non-Medical): Not on  file  Physical Activity:    Days of Exercise per Week: Not on file   Minutes of Exercise per Session: Not on file  Stress:    Feeling of Stress : Not on file  Social Connections:    Frequency of Communication with Friends and Family: Not on file   Frequency of Social Gatherings with Friends and Family: Not on file   Attends Religious Services: Not on file   Active Member of Clubs or Organizations: Not on file   Attends Archivist Meetings: Not on file   Marital Status: Not on file  Intimate Partner Violence:    Fear of Current or Ex-Partner: Not on file   Emotionally Abused: Not on file   Physically Abused: Not on file   Sexually Abused: Not on file    Outpatient Medications Prior to Visit  Medication Sig Dispense  Refill   atorvastatin (LIPITOR) 80 MG tablet TAKE 1 TABLET BY MOUTH AT BEDTIME. 30 tablet 6   FARXIGA 5 MG TABS tablet TAKE 1 TABLET BY MOUTH ONCE DAILY 90 tablet 5   furosemide (LASIX) 40 MG tablet TAKE 1 TABLET BY MOUTH ONCE DAILY 100 tablet 2   metFORMIN (GLUCOPHAGE) 1000 MG tablet TAKE 1 TABLET BY MOUTH TWICE A DAY WITH FOOD (Patient taking differently: daily with breakfast. take 1 tablet by mouth twice a day with food) 60 tablet 5   metoprolol succinate (TOPROL-XL) 100 MG 24 hr tablet TAKE 1 TABLET BY MOUTH 2 TIMES DAILY. TAKE WITH OR IMMEDIATELY FOLLOWING A MEAL. (Patient taking differently: Take 100 mg by mouth daily. ) 180 tablet 3   sacubitril-valsartan (ENTRESTO) 97-103 MG Take 1 tablet by mouth 2 (two) times daily. (Patient taking differently: Take 1 tablet by mouth daily. ) 180 tablet 3   spironolactone (ALDACTONE) 25 MG tablet Take 1 tablet (25 mg total) by mouth daily. Last refill without office visit. Please call [phone] 30 tablet 0   apixaban (ELIQUIS) 5 MG TABS tablet Take 1 tablet (5 mg total) by mouth 2 (two) times daily. Last refill without office visit. Please call [phone] (Patient not taking: Reported on 10/20/2019) 60 tablet 0   docusate sodium (COLACE) 100 MG capsule Take 1 capsule (100 mg total) by mouth 2 (two) times daily. (Patient not taking: Reported on 10/20/2019) 10 capsule 0   No facility-administered medications prior to visit.    No Known Allergies  ROS Review of Systems  Neurological: Positive for numbness.       Tingling      Objective:    Physical Exam Constitutional:      General: He is not in acute distress.    Appearance: He is obese. He is not ill-appearing or toxic-appearing.  HENT:     Head: Normocephalic and atraumatic.     Right Ear: There is impacted cerumen.     Left Ear: There is no impacted cerumen.     Nose: Nose normal.     Mouth/Throat:     Mouth: Mucous membranes are moist.  Cardiovascular:     Rate and Rhythm:  Normal rate and regular rhythm.     Pulses: Normal pulses.     Heart sounds: Normal heart sounds.  Pulmonary:     Effort: Pulmonary effort is normal.     Breath sounds: Normal breath sounds.  Abdominal:     Palpations: Abdomen is soft.     Comments: Increased abdominal girth  Musculoskeletal:        General: Normal range of  motion.     Cervical back: Normal range of motion.  Skin:    General: Skin is warm.     Capillary Refill: Capillary refill takes less than 2 seconds.  Neurological:     General: No focal deficit present.     Mental Status: He is alert and oriented to person, place, and time.  Psychiatric:        Mood and Affect: Mood normal.        Behavior: Behavior normal.        Thought Content: Thought content normal.        Judgment: Judgment normal.     BP 105/70 (BP Location: Right Arm, Patient Position: Sitting, Cuff Size: Large)    Pulse 72    Temp 97.9 F (36.6 C)    Resp 16    Ht _0  (1.88 m)    Wt (!) 309 lb (140.2 kg)    SpO2 95%    BMI 39.67 kg/m  Wt Readings from Last 3 Encounters:  10/20/19 (!) 309 lb (140.2 kg)  10/19/19 (!) 311 lb (141.1 kg)  07/27/19 (!) 313 lb 6.4 oz (142.2 kg)     Health Maintenance Due  Topic Date Due   COLONOSCOPY  Never done   INFLUENZA VACCINE  09/11/2019    There are no preventive care reminders to display for this patient.  Lab Results  Component Value Date   TSH 0.083 (L) 10/02/2017   Lab Results  Component Value Date   WBC 8.3 04/05/2019   HGB 16.8 04/05/2019   HCT 48.6 04/05/2019   MCV 89 04/05/2019   PLT 256 04/05/2019   Lab Results  Component Value Date   NA 137 10/20/2019   K 4.9 10/20/2019   CO2 20 04/05/2019   GLUCOSE 128 (H) 10/20/2019   BUN 20 10/20/2019   CREATININE 1.24 10/20/2019   BILITOT 0.8 10/20/2019   ALKPHOS 82 10/20/2019   AST 16 10/20/2019   ALT 25 01/22/2018   PROT 6.8 10/20/2019   ALBUMIN 4.3 10/20/2019   CALCIUM 10.0 10/20/2019   ANIONGAP 12 01/22/2018   GFR 89.09  05/15/2014   Lab Results  Component Value Date   CHOL 140 10/20/2019   Lab Results  Component Value Date   HDL 53 10/20/2019   Lab Results  Component Value Date   LDLCALC 65 10/20/2019   Lab Results  Component Value Date   TRIG 126 10/20/2019   Lab Results  Component Value Date   CHOLHDL 2.6 10/20/2019   Lab Results  Component Value Date   HGBA1C 6.7 (A) 10/20/2019   HGBA1C 6.7 10/20/2019   HGBA1C 6.7 (A) 10/20/2019   HGBA1C 6.7 10/20/2019      Assessment & Plan:   Problem List Items Addressed This Visit      Cardiovascular and Mediastinum   CHF (congestive heart failure) ([place])  Continue to follow-up with heart failure     Respiratory   OSA - C-pap (Chronic) Encourage continued use of CPAP     Endocrine   Type 2 diabetes mellitus (HCC) (Chronic)   Relevant Orders   POC Glucose (CBG) (Completed)   POC HgB A1c (Completed)   Lipid panel (Completed)   Comp. Metabolic Panel (12) (Completed)     Other   Non compliance with medical treatment and diet (Chronic)    Other Visit Diagnoses    Essential hypertension    -  Primary Encouraged on going compliance with current medication regimen Encouraged home monitoring and  recording BP <130/80 Eating a heart-healthy diet with less salt Encouraged regular physical activity  Recommend Weight loss      Relevant Orders   Comp. 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)     Encourage compliance with current treatment regimen   Lifestyle modification with healthy diet (fewer calories, more high fiber foods, whole grains and non-starchy vegetables, lower fat meat and fish, low-fat diary include healthy oils) regular exercise (physical activity) and weight loss Opthalmology exam discussed  Nutritional consult recommended discussed diet in detail Regular dental visits encouraged Home BP monitoring also encouraged goal <130/80    Relevant Medications        Other Relevant Orders   Urinalysis Dipstick (Completed)   Atrial fibrillation, unspecified type ([place])     Continue to follow with cardiology as scheduled   Right ear impacted cerumen       Encounter for hepatitis C screening test for low risk patient       Relevant Orders   Hepatitis C antibody   Abnormal urine finding       Relevant Orders   Urine Culture (Completed)   Gross hematuria     We will consider discontinuing the Eliquis and using a different agent after consulting with cardiology for suggestions   Relevant Orders   PSA      Meds ordered this encounter  Medications   fluPHENAZine decanoate (PROLIXIN) injection 5 mg    Follow-up: Return in about 3 months (around 01/19/2020).    Vevelyn Francois, NP

## 2019-10-20 NOTE — Patient Instructions (Signed)

## 2019-10-21 LAB — COMP. METABOLIC PANEL (12)
AST: 16 IU/L (ref 0–40)
Albumin/Globulin Ratio: 1.7 (ref 1.2–2.2)
Albumin: 4.3 g/dL (ref 3.8–4.8)
Alkaline Phosphatase: 82 IU/L (ref 48–121)
BUN/Creatinine Ratio: 16 (ref 10–24)
BUN: 20 mg/dL (ref 8–27)
Bilirubin Total: 0.8 mg/dL (ref 0.0–1.2)
Calcium: 10 mg/dL (ref 8.6–10.2)
Chloride: 102 mmol/L (ref 96–106)
Creatinine, Ser: 1.24 mg/dL (ref 0.76–1.27)
GFR calc Af Amer: 70 mL/min/{1.73_m2} (ref >59)
GFR calc non Af Amer: 61 mL/min/{1.73_m2} (ref >59)
Globulin, Total: 2.5 g/dL (ref 1.5–4.5)
Glucose: 128 mg/dL — ABNORMAL HIGH (ref 65–99)
Potassium: 4.9 mmol/L (ref 3.5–5.2)
Sodium: 137 mmol/L (ref 134–144)
Total Protein: 6.8 g/dL (ref 6.0–8.5)

## 2019-10-21 LAB — LIPID PANEL
Chol/HDL Ratio: 2.6 ratio (ref 0.0–5.0)
Cholesterol, Total: 140 mg/dL (ref 100–199)
HDL: 53 mg/dL (ref >39)
LDL Chol Calc (NIH): 65 mg/dL (ref 0–99)
Triglycerides: 126 mg/dL (ref 0–149)
VLDL Cholesterol Cal: 22 mg/dL (ref 5–40)

## 2019-10-22 LAB — URINE CULTURE

## 2019-10-22 LAB — SPECIMEN STATUS REPORT

## 2019-10-24 ENCOUNTER — Encounter (HOSPITAL_COMMUNITY): Payer: Medicare Other

## 2019-11-07 ENCOUNTER — Other Ambulatory Visit (HOSPITAL_COMMUNITY): Payer: Self-pay | Admitting: Cardiology

## 2019-11-07 MED FILL — METOPROLOL SUCCINATE ER 100: 100 | 30 days supply | Qty: 60 | Fill #7

## 2019-11-07 MED FILL — FARXIGA 5 MG TABLET: 5 | 90 days supply | Qty: 90 | Fill #0

## 2019-11-07 MED FILL — metFORMIN HCL 1000 MG TABS: 1000 | 30 days supply | Qty: 60 | Fill #3

## 2019-11-07 MED FILL — ENTRESTO 97 MG-103 MG TAB: 97-103 | 30 days supply | Qty: 60 | Fill #9

## 2019-11-08 ENCOUNTER — Other Ambulatory Visit (HOSPITAL_COMMUNITY): Payer: Self-pay | Admitting: Cardiology

## 2019-11-08 MED FILL — SPIRONOLACTONE 25 MG TABS: 25 | 30 days supply | Qty: 30 | Fill #0

## 2019-11-08 MED FILL — ELIQUIS 5 MG TABLET: 5 | 30 days supply | Qty: 60 | Fill #0

## 2019-11-08 MED FILL — ATORVASTATIN 80 MG TABLET: 80 | 30 days supply | Qty: 30 | Fill #0

## 2019-12-12 ENCOUNTER — Other Ambulatory Visit (HOSPITAL_COMMUNITY): Payer: Self-pay | Admitting: Cardiology

## 2019-12-12 MED FILL — METFORMIN HCL 1000 MG TABS: 1000 | 30 days supply | Qty: 60 | Fill #4

## 2019-12-12 MED FILL — ENTRESTO 97 MG-103 MG TAB: 97-103 | 30 days supply | Qty: 60 | Fill #0

## 2019-12-12 MED FILL — AMIODARONE HCL 200 MG TAB: 200 | 30 days supply | Qty: 30 | Fill #0

## 2019-12-12 MED FILL — ATORVASTATIN 80 MG TABLET: 80 | 30 days supply | Qty: 30 | Fill #0

## 2019-12-12 MED FILL — SPIRONOLACTONE 25 MG TABS: 25 | 30 days supply | Qty: 30 | Fill #0

## 2019-12-12 MED FILL — ELIQUIS 5 MG TABLET: 5 | 30 days supply | Qty: 60 | Fill #0

## 2019-12-13 MED FILL — METOPROLOL SUCCINATE ER 100: 100 | 30 days supply | Qty: 60 | Fill #8

## 2020-01-09 ENCOUNTER — Telehealth: Payer: Self-pay | Admitting: *Deleted

## 2020-01-09 NOTE — Telephone Encounter (Signed)
Patient has questions about getting the booster with the new variant now. Advised patient to do the best to protect himself at this point in time- booster is advised. He states he will keep appointment.

## 2020-01-13 ENCOUNTER — Ambulatory Visit: Payer: Medicare HMO | Attending: Internal Medicine

## 2020-01-13 DIAGNOSIS — Z23 Encounter for immunization: Secondary | ICD-10-CM

## 2020-01-13 NOTE — Progress Notes (Signed)
   Covid-19 Vaccination Clinic  Name:  Darius [name]    MRN: [id number] DOB: 1955/08/31  01/13/2020  [name] was observed post Covid-19 immunization for 15 minutes without incident. He was provided with Vaccine Information Sheet and instruction to access the V-Safe system.   [name] was instructed to call 911 with any severe reactions post vaccine: Marland Kitchen Difficulty breathing  . Swelling of face and throat  . A fast heartbeat  . A bad rash all over body  . Dizziness and weakness   Immunizations Administered    Name Date Dose VIS Date Route   Pfizer COVID-19 Vaccine 01/13/2020  1:34 PM 0.3 mL 11/30/2019 Intramuscular   Manufacturer: [place]   Lot: [id number]   [place]: [id number]

## 2020-01-17 ENCOUNTER — Other Ambulatory Visit (HOSPITAL_COMMUNITY): Payer: Self-pay | Admitting: Cardiology

## 2020-01-17 MED FILL — METOPROLOL SUCCINATE ER 100: 100 | 30 days supply | Qty: 60 | Fill #9

## 2020-01-17 MED FILL — ENTRESTO 97 MG-103 MG TAB: 97-103 | 30 days supply | Qty: 60 | Fill #1

## 2020-01-17 MED FILL — ATORVASTATIN 80 MG TABLET: 80 | 30 days supply | Qty: 30 | Fill #1

## 2020-01-17 MED FILL — METFORMIN HCL 1000 MG TABS: 1000 | 30 days supply | Qty: 60 | Fill #5

## 2020-01-18 ENCOUNTER — Encounter (HOSPITAL_COMMUNITY): Payer: Self-pay | Admitting: Cardiology

## 2020-01-18 ENCOUNTER — Other Ambulatory Visit: Payer: Self-pay

## 2020-01-18 ENCOUNTER — Ambulatory Visit (HOSPITAL_BASED_OUTPATIENT_CLINIC_OR_DEPARTMENT_OTHER)
Admission: RE | Admit: 2020-01-18 | Discharge: 2020-01-18 | Disposition: A | Discharge status: Home / Self Care | Payer: Medicare HMO | Source: Ambulatory Visit | Attending: Cardiology | Admitting: Cardiology

## 2020-01-18 ENCOUNTER — Ambulatory Visit (HOSPITAL_COMMUNITY)
Admission: RE | Admit: 2020-01-18 | Discharge: 2020-01-18 | Disposition: A | Discharge status: Home / Self Care | Payer: Medicare HMO | Source: Ambulatory Visit | Attending: Cardiology | Admitting: Cardiology

## 2020-01-18 ENCOUNTER — Other Ambulatory Visit (HOSPITAL_COMMUNITY): Payer: Self-pay | Admitting: Cardiology

## 2020-01-18 VITALS — BP 110/70 | HR 76 | Wt 311.0 lb

## 2020-01-18 DIAGNOSIS — I5022 Chronic systolic (congestive) heart failure: Secondary | ICD-10-CM | POA: Diagnosis not present

## 2020-01-18 DIAGNOSIS — I2510 Atherosclerotic heart disease of native coronary artery without angina pectoris: Secondary | ICD-10-CM | POA: Diagnosis not present

## 2020-01-18 DIAGNOSIS — Z8249 Family history of ischemic heart disease and other diseases of the circulatory system: Secondary | ICD-10-CM | POA: Insufficient documentation

## 2020-01-18 DIAGNOSIS — G4733 Obstructive sleep apnea (adult) (pediatric): Secondary | ICD-10-CM | POA: Insufficient documentation

## 2020-01-18 DIAGNOSIS — Z79899 Other long term (current) drug therapy: Secondary | ICD-10-CM | POA: Insufficient documentation

## 2020-01-18 DIAGNOSIS — E785 Hyperlipidemia, unspecified: Secondary | ICD-10-CM | POA: Diagnosis not present

## 2020-01-18 DIAGNOSIS — I4819 Other persistent atrial fibrillation: Secondary | ICD-10-CM | POA: Insufficient documentation

## 2020-01-18 DIAGNOSIS — I5032 Chronic diastolic (congestive) heart failure: Secondary | ICD-10-CM | POA: Diagnosis not present

## 2020-01-18 DIAGNOSIS — I4891 Unspecified atrial fibrillation: Secondary | ICD-10-CM | POA: Diagnosis not present

## 2020-01-18 DIAGNOSIS — E1140 Type 2 diabetes mellitus with diabetic neuropathy, unspecified: Secondary | ICD-10-CM | POA: Diagnosis not present

## 2020-01-18 DIAGNOSIS — Z7984 Long term (current) use of oral hypoglycemic drugs: Secondary | ICD-10-CM | POA: Diagnosis not present

## 2020-01-18 DIAGNOSIS — I428 Other cardiomyopathies: Secondary | ICD-10-CM | POA: Insufficient documentation

## 2020-01-18 DIAGNOSIS — Z7901 Long term (current) use of anticoagulants: Secondary | ICD-10-CM | POA: Insufficient documentation

## 2020-01-18 DIAGNOSIS — I509 Heart failure, unspecified: Secondary | ICD-10-CM

## 2020-01-18 HISTORY — DX: Heart failure, unspecified: I50.9

## 2020-01-18 LAB — ECHOCARDIOGRAM COMPLETE
Area-P 1/2: 2.99 cm2
S' Lateral: 3 cm

## 2020-01-18 LAB — BASIC METABOLIC PANEL
Anion gap: 9 (ref 5–15)
BUN: 14 mg/dL (ref 8–23)
CO2: 24 mmol/L (ref 22–32)
Calcium: 8.9 mg/dL (ref 8.9–10.3)
Chloride: 101 mmol/L (ref 98–111)
Creatinine, Ser: 1.14 mg/dL (ref 0.61–1.24)
GFR, Estimated: >60 mL/min (ref >60)
Glucose, Bld: 143 mg/dL — ABNORMAL HIGH (ref 70–99)
Potassium: 4.5 mmol/L (ref 3.5–5.1)
Sodium: 134 mmol/L — ABNORMAL LOW (ref 135–145)

## 2020-01-18 MED FILL — SPIRONOLACTONE 25 MG TABS: 25 | 30 days supply | Qty: 30 | Fill #0

## 2020-01-18 MED FILL — ELIQUIS 5 MG TABLET: 5 | 30 days supply | Qty: 60 | Fill #0

## 2020-01-18 NOTE — Progress Notes (Signed)
Echocardiogram 2D Echocardiogram has been performed.  Oneal Deputy Roslin Norwood 01/18/2020, 2:35 PM

## 2020-01-18 NOTE — Patient Instructions (Addendum)
Labs done today, your results will be available in MyChart, we will contact you for abnormal readings.  Please call our office so we can walk you through your medication list. We will be happy to help you out with any questions you have  You have been referred to Sleep studies. They will contact you to schedule.  Your physician recommends that you schedule a follow-up appointment in: 3 months for repeat labs  Your physician recommends that you schedule a follow-up appointment in: 6 months. Please call our office in May 2022 to schedule  If you have any questions or concerns before your next appointment please send [place] a message through [place] or call our office at [phone].    TO LEAVE A MESSAGE FOR THE NURSE SELECT OPTION 2, PLEASE LEAVE A MESSAGE INCLUDING: . YOUR NAME . DATE OF BIRTH . CALL BACK NUMBER . REASON FOR CALL**this is important as we prioritize the call backs  YOU WILL RECEIVE A CALL BACK THE SAME DAY AS LONG AS YOU CALL BEFORE 4:00 PM

## 2020-01-18 NOTE — Progress Notes (Signed)
Pt complained of blood in urine. Patient states no pain. Advised to call PCP, or go to ED if continues or painful. Patient verbalized understanding.

## 2020-01-19 NOTE — Progress Notes (Signed)
Advanced Heart Failure Clinic Note   PCP: Darius Benton, DO  HF Cardiologist:  Dr. Aundra [name]  HPI: [name] is a 64 y.o. male with DM, paroxysmal Afib, history of left atrial appendage thrombus (on Eliquis), OSA, chronic systolic CHF, NICM (EF 35%) felt to be tachycardia mediated.   Admitted 0/0/93-09/28/27 for acute systolic CHF and atrial fibrillation with RVR. Had a R/L heart cath on 04/21/16, no CAD, right heart pressures elevated, low output. Echo showed EF 15%. Plan was to undergo TEE/DC-CV, however he was found to have a left atrial appendage thrombus and had only started Eliquis 5 days prior. He was discharged with plans for TEE/DC-CV in about a month. He was started on Entresto 24/26 mg BID during admission. Discharged home on Lasix 40 mg daily, digoxin 0.188m, and metoprolol XL 50 mg BID. Discharge 280 pounds.   Repeat TEE done in 4/18 for possible cardioversion.  EF remained 25-30%.  He still had a small, amorphous LA appendage thrombus (smaller than prior but still present).  He admitted to having missed some Eliquis doses.    He finally had TEE-guided DCCV in 7/18 with conversion to NSR.  TEE showed EF 40-45%.  After this, he went back into atrial fibrilation.  Given poor compliance with followup and recurrent atrial fibrillation, it was decided to leave him in atrial fibrillation as rate was well-controlled.    Echo was done today and reviewed, EF remains 55% with mild LVH, mildly decreased RV systolic function, IVC normal.   He returns today for followup of CHF and atrial fibrillation.  He did not bring his medications with him and is not totally sure about the doses.  His weight is stable compared to prior appointment (has been > 1 year). He does not feel palpitations but remains in atrial fibrillation today.  He denies dyspnea with his usual activities, able to mow grass and do yardwork without problems.  Still takes Lasix 40 mg daily. No chest pain.  No orthopnea/PND.  No  lightheadedness/syncope.   ECG (personally reviewed): atrial fibrillation at 84, anteroseptal Qs  Cardiac Studies  RHC/LHC 04/21/2016  RA mean 14 RV 45/15 PA 50/28, mean 36 PCWP mean 22 LV 104/20 AO 101/75 Oxygen saturations: PA 59% AO 92% Cardiac Output (Fick) 4.85  Cardiac Index (Fick) 1.89 PVR 2.9 WU  - TEE: 04/2016 with LAA thrombus.  - TEE: 4/18 with EF 25-30%, diffuse hypokinesis, RV mildly dilated with moderately decreased systolic function, amorphous thrombus LA appendage (improved compared to 3/18).  -TEE 09/08/2016 EF 40-45% No thrombus. Successful DC-CV  - ECHO 04/24/2016 EF 15% Peak PA pressure 38 mm hg  - ECHO 8/19: EF 55-60% - ECHO 12/21: EF remains 55% with mild LVH, mildly decreased RV systolic function, IVC normal.    Labs  04/24/2016: K 4.5 Creatinine 1.00, digoxin < 0.2 4/18: K 4.3, creatinine 1.02 10/18: Thyroid panel normal, K 4.6, creatinine 1.05, LFTs normal, hgb 14.4 11/18: LDL 42, HDL 62 1/19: K 4.6, creatinine 0.97, LFTs normal, BNP 129 9/21: LDL 65, HDl 53, K 4.9, creatinine 1.24  Review of systems complete and found to be negative unless listed in HPI.   SH:  Social History   Socioeconomic History  . Marital status: Single    Spouse name: Not on file  . Number of children: 0  . Years of education: 119 . Highest education level: Not on file  Occupational History  . Occupation: unemployeed  Tobacco Use  . Smoking status: Never Smoker  .  Smokeless tobacco: Never Used  Vaping Use  . Vaping Use: Never used  Substance and Sexual Activity  . Alcohol use: Yes    Comment: some  . Drug use: No  . Sexual activity: Never  Other Topics Concern  . Not on file  Social History Narrative   Admits that he eats poorly and eats a lot at work.      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   Social Determinants of Health   Financial Resource Strain: Not on file  Food Insecurity: Not on file  Transportation Needs: Not on file   Physical Activity: Not on file  Stress: Not on file  Social Connections: Not on file  Intimate Partner Violence: Not on file    FH:  Family History  Problem Relation Age of Onset  . Diabetes Father        died in his 44s  . Hypertension Father   . Seizures Mother        died in her 59s  . Hypertension Sister   . Hypertension Paternal Grandfather   . Heart attack Neg Hx   . Stroke Neg Hx     Past Medical History:  Diagnosis Date  . Atrial fibrillation ([place])    unable to tolerate versed and fentanyl sedation and required gen anesthesia for TEE;  s/p TEE-DCCV (failed);  Amiodarone started  . CHF (congestive heart failure) ([place])   . Chronic diastolic heart failure ([place])    a. Echo 03/27/11: EF 50-55%, inferior hypokinesis, moderate LAE, mild RVE, normal pulmonary pressures. ;   b.  TEE (04/03/11): EF 40-45%, mild MR moderate LAE, no LAA clot, mild RAE;  c.  Echocardiogram (02/2013): EF 60-65%, Gr 2 DD, mildly dilated Ao root (Ao root dimension 39 mm), MAC, mild LAE, normal RVF  . Diabetes mellitus, type 2 ([place])   . Hx of cardiovascular stress test    Lexiscan Myoview (02/2013): No ischemia or scar, EF 51%, low risk  . Morbid obesity ([place])   . Sleep apnea    a. sleep test 01/2013:  very severy OSA    Current Outpatient Medications  Medication Sig Dispense Refill  . atorvastatin (LIPITOR) 80 MG tablet TAKE 1 TABLET BY MOUTH AT BEDTIME. 30 tablet 6  . ELIQUIS 5 MG TABS tablet TAKE 1 TABLET (5 MG TOTAL) BY MOUTH 2 (TWO) TIMES DAILY. LAST REFILL WITHOUT OFFICE VISIT. PLEASE CALL [phone] 60 tablet 0  . ENTRESTO 97-103 MG TAKE 1 TABLET BY MOUTH 2 (TWO) TIMES DAILY. 60 tablet 3  . FARXIGA 5 MG TABS tablet TAKE 1 TABLET BY MOUTH ONCE DAILY 90 tablet 5  . furosemide (LASIX) 40 MG tablet TAKE 1 TABLET BY MOUTH ONCE DAILY 100 tablet 2  . metFORMIN (GLUCOPHAGE) 1000 MG tablet TAKE 1 TABLET BY MOUTH TWICE A DAY WITH FOOD 60 tablet 5  . metoprolol succinate (TOPROL-XL) 100 MG 24 hr tablet  TAKE 1 TABLET BY MOUTH 2 TIMES DAILY. TAKE WITH OR IMMEDIATELY FOLLOWING A MEAL. (Patient taking differently: Take 100 mg by mouth daily. ) 180 tablet 3  . spironolactone (ALDACTONE) 25 MG tablet TAKE 1 TABLET (25 MG TOTAL) BY MOUTH DAILY. LAST REFILL WITHOUT OFFICE VISIT. PLEASE CALL [phone] 30 tablet 0   No current facility-administered medications for this encounter.    Vitals:   01/18/20 1449  BP: 110/70  Pulse: 76  SpO2: 94%  Weight: (!) 141.1 kg (311 lb)   Wt Readings from Last 3 Encounters:  01/18/20 Darius [name] Kitchen)  141.1 kg (311 lb)  10/20/19 (!) 140.2 kg (309 lb)  10/19/19 (!) 141.1 kg (311 lb)    PHYSICAL EXAM: General: NAD, obese Neck: No JVD, no thyromegaly or thyroid nodule.  Lungs: Clear to auscultation bilaterally with normal respiratory effort. CV: Nondisplaced PMI.  Heart irregular S1/S2, no S3/S4, no murmur.  1+ ankle edema.  No carotid bruit.  Normal pedal pulses.  Abdomen: Soft, nontender, no hepatosplenomegaly, no distention.  Skin: Intact without lesions or rashes.  Neurologic: Alert and oriented x 3.  Psych: Normal affect. Extremities: No clubbing or cyanosis.  HEENT: Normal.   ASSESSMENT & PLAN:  1. Chronic systolic CHF: Had [facility] 07/3014 showing minimal CAD.  Nonischemic cardiomyopathy, possibly tachycardia-mediated in the setting of rapid atrial fibrillation.   Echo 04/2016 EF 15% but improved to 40-45% on TEE in 7/18 and 55-60% by 8/19 echo.  Echo was done today and reviewed, EF remains 55% despite being back in atrial fibrillation.  Rate is reasonably controlled. He is not volume overloaded on exam, NYHA class II symptoms. - Continue lasix 40 mg daily  - Continue Entresto 97/103 bid.     - Continue spironolactone 25 mg daily.  - Continue Toprol XL 100 mg bid (he is not sure if this is daily or bid, will call us back to let us know how he takes it). - BMET today and will need repeat BMET in 3 months.   2. Atrial fibrillation: Persistent atrial fibrillation.  Was  thought to have tachy-mediated CMP in past, but EF 55% on today's echo in setting of good rate control (HR in 70s).  We have decided not to cardiovert him again as he broke through amiodarone and is not particularly compliant.  He is now off amiodarone.  - Continue apixaban 5 mg bid.  Denies bleeding.  3. HLD: Continue statin, good lipids in 9/21.  4. OSA: Needs appoint with sleep medicine to get back on CPAP, will try to arrange.  5. Diabetes: Per primary, has neuropathy.   Followup in 6 months with APP.    Loralie Champagne 01/19/2020

## 2020-01-20 ENCOUNTER — Other Ambulatory Visit (HOSPITAL_COMMUNITY): Payer: Self-pay | Admitting: Cardiology

## 2020-01-24 ENCOUNTER — Other Ambulatory Visit (HOSPITAL_COMMUNITY): Payer: Self-pay | Admitting: Cardiology

## 2020-02-29 ENCOUNTER — Other Ambulatory Visit (HOSPITAL_COMMUNITY): Payer: Self-pay | Admitting: Cardiology

## 2020-02-29 ENCOUNTER — Other Ambulatory Visit: Payer: Self-pay | Admitting: Family Medicine

## 2020-02-29 ENCOUNTER — Other Ambulatory Visit: Payer: Self-pay | Admitting: Cardiology

## 2020-02-29 DIAGNOSIS — E119 Type 2 diabetes mellitus without complications: Secondary | ICD-10-CM

## 2020-04-10 ENCOUNTER — Other Ambulatory Visit (HOSPITAL_COMMUNITY): Payer: Self-pay | Admitting: Cardiology

## 2020-04-10 MED FILL — ENTRESTO 97 MG-103 MG TAB: 97-103 | 30 days supply | Qty: 60 | Fill #2

## 2020-04-10 MED FILL — ELIQUIS 5 MG TABLET: 5 | 30 days supply | Qty: 60 | Fill #0

## 2020-04-10 MED FILL — SPIRONOLACTONE 25 MG TABS: 25 | 30 days supply | Qty: 30 | Fill #0

## 2020-04-10 MED FILL — METFORMIN HCL 1000 MG TABS: 1000 | 30 days supply | Qty: 60 | Fill #0

## 2020-04-10 MED FILL — METOPROLOL SUCCINATE ER 100: 100 | 30 days supply | Qty: 60 | Fill #0

## 2020-04-10 MED FILL — ATORVASTATIN 80 MG TABLET: 80 | 30 days supply | Qty: 30 | Fill #2

## 2020-04-11 ENCOUNTER — Telehealth (HOSPITAL_COMMUNITY): Payer: Self-pay | Admitting: Cardiology

## 2020-04-11 NOTE — Telephone Encounter (Signed)
Per last office note patient is no longer taking amiodarone. Called pt to get more information no answer.

## 2020-04-11 NOTE — Telephone Encounter (Signed)
Pt request Amiodarone 200 MG refill, please send script to [facility] outpatient pharmacy, pt can be reached [phone]. Thanks

## 2020-04-17 ENCOUNTER — Other Ambulatory Visit: Payer: Self-pay

## 2020-04-17 ENCOUNTER — Telehealth (HOSPITAL_COMMUNITY): Payer: Self-pay | Admitting: Cardiology

## 2020-04-17 ENCOUNTER — Other Ambulatory Visit (HOSPITAL_COMMUNITY): Payer: Self-pay

## 2020-04-17 ENCOUNTER — Ambulatory Visit (HOSPITAL_COMMUNITY)
Admission: RE | Admit: 2020-04-17 | Discharge: 2020-04-17 | Disposition: A | Discharge status: Home / Self Care | Payer: Medicare HMO | Source: Ambulatory Visit | Attending: Cardiology | Admitting: Cardiology

## 2020-04-17 DIAGNOSIS — I5032 Chronic diastolic (congestive) heart failure: Secondary | ICD-10-CM | POA: Insufficient documentation

## 2020-04-17 LAB — BASIC METABOLIC PANEL
Anion gap: 11 (ref 5–15)
BUN: 22 mg/dL (ref 8–23)
CO2: 22 mmol/L (ref 22–32)
Calcium: 9.1 mg/dL (ref 8.9–10.3)
Chloride: 103 mmol/L (ref 98–111)
Creatinine, Ser: 1.38 mg/dL — ABNORMAL HIGH (ref 0.61–1.24)
GFR, Estimated: 56 mL/min — ABNORMAL LOW (ref >60)
Glucose, Bld: 212 mg/dL — ABNORMAL HIGH (ref 70–99)
Potassium: 4.2 mmol/L (ref 3.5–5.1)
Sodium: 136 mmol/L (ref 135–145)

## 2020-04-17 NOTE — Telephone Encounter (Signed)
error 

## 2020-04-17 NOTE — Telephone Encounter (Signed)
Spoke w/pt, he is aware Amio was stopped

## 2020-04-18 ENCOUNTER — Other Ambulatory Visit (HOSPITAL_COMMUNITY): Payer: Self-pay | Admitting: Cardiology

## 2020-04-28 DIAGNOSIS — I509 Heart failure, unspecified: Secondary | ICD-10-CM | POA: Diagnosis not present

## 2020-04-28 DIAGNOSIS — Z7901 Long term (current) use of anticoagulants: Secondary | ICD-10-CM | POA: Diagnosis not present

## 2020-04-28 DIAGNOSIS — Z803 Family history of malignant neoplasm of breast: Secondary | ICD-10-CM | POA: Diagnosis not present

## 2020-04-28 DIAGNOSIS — I4891 Unspecified atrial fibrillation: Secondary | ICD-10-CM | POA: Diagnosis not present

## 2020-04-28 DIAGNOSIS — E119 Type 2 diabetes mellitus without complications: Secondary | ICD-10-CM | POA: Diagnosis not present

## 2020-04-28 DIAGNOSIS — D6869 Other thrombophilia: Secondary | ICD-10-CM | POA: Diagnosis not present

## 2020-04-28 DIAGNOSIS — Z833 Family history of diabetes mellitus: Secondary | ICD-10-CM | POA: Diagnosis not present

## 2020-04-28 DIAGNOSIS — Z7984 Long term (current) use of oral hypoglycemic drugs: Secondary | ICD-10-CM | POA: Diagnosis not present

## 2020-04-28 DIAGNOSIS — E785 Hyperlipidemia, unspecified: Secondary | ICD-10-CM | POA: Diagnosis not present

## 2020-05-12 ENCOUNTER — Other Ambulatory Visit (HOSPITAL_COMMUNITY): Payer: Self-pay

## 2020-05-22 ENCOUNTER — Other Ambulatory Visit (HOSPITAL_COMMUNITY): Payer: Self-pay

## 2020-05-22 ENCOUNTER — Other Ambulatory Visit (HOSPITAL_COMMUNITY): Payer: Self-pay | Admitting: Cardiology

## 2020-05-22 MED FILL — Atorvastatin Calcium Tab 80 MG (Base Equivalent): ORAL | 30 days supply | Qty: 30 | Fill #0 | Status: AC

## 2020-05-22 MED FILL — Metoprolol Succinate Tab ER 24HR 100 MG (Tartrate Equiv): ORAL | 30 days supply | Qty: 60 | Fill #0 | Status: AC

## 2020-05-22 MED FILL — Metformin HCl Tab 1000 MG: ORAL | 30 days supply | Qty: 60 | Fill #0 | Status: AC

## 2020-05-22 MED FILL — Sacubitril-Valsartan Tab 97-103 MG: ORAL | 30 days supply | Qty: 60 | Fill #0 | Status: AC

## 2020-05-25 ENCOUNTER — Other Ambulatory Visit (HOSPITAL_COMMUNITY): Payer: Self-pay

## 2020-05-25 MED ORDER — SPIRONOLACTONE 25 MG PO TABS
25.0000 mg | ORAL_TABLET | Freq: Every day | ORAL | 0 refills | Status: DC
  Filled 2020-05-25 – 2020-07-25 (×2): qty 30, 30d supply, fill #0

## 2020-06-04 ENCOUNTER — Other Ambulatory Visit (HOSPITAL_COMMUNITY): Payer: Self-pay

## 2020-07-20 ENCOUNTER — Telehealth (HOSPITAL_COMMUNITY): Payer: Self-pay | Admitting: *Deleted

## 2020-07-20 NOTE — Telephone Encounter (Signed)
Caryl Pina RN case Freight forwarder with [facility] called to review patients medication list and make sure pt has a f/u. Called Caryl Pina back at [phone] and left vm for return call.

## 2020-07-23 ENCOUNTER — Encounter (HOSPITAL_COMMUNITY): Payer: Self-pay | Admitting: Cardiology

## 2020-07-23 ENCOUNTER — Ambulatory Visit (HOSPITAL_COMMUNITY)
Admission: RE | Admit: 2020-07-23 | Discharge: 2020-07-23 | Disposition: A | Discharge status: Home / Self Care | Payer: Medicare HMO | Source: Ambulatory Visit | Attending: Cardiology | Admitting: Cardiology

## 2020-07-23 ENCOUNTER — Other Ambulatory Visit: Payer: Self-pay

## 2020-07-23 VITALS — BP 112/70 | HR 81 | Wt 303.0 lb

## 2020-07-23 DIAGNOSIS — R319 Hematuria, unspecified: Secondary | ICD-10-CM

## 2020-07-23 DIAGNOSIS — E1140 Type 2 diabetes mellitus with diabetic neuropathy, unspecified: Secondary | ICD-10-CM | POA: Diagnosis not present

## 2020-07-23 DIAGNOSIS — E785 Hyperlipidemia, unspecified: Secondary | ICD-10-CM | POA: Insufficient documentation

## 2020-07-23 DIAGNOSIS — Z6838 Body mass index (BMI) 38.0-38.9, adult: Secondary | ICD-10-CM | POA: Insufficient documentation

## 2020-07-23 DIAGNOSIS — G4733 Obstructive sleep apnea (adult) (pediatric): Secondary | ICD-10-CM | POA: Insufficient documentation

## 2020-07-23 DIAGNOSIS — I509 Heart failure, unspecified: Secondary | ICD-10-CM

## 2020-07-23 DIAGNOSIS — I5032 Chronic diastolic (congestive) heart failure: Secondary | ICD-10-CM

## 2020-07-23 DIAGNOSIS — I4819 Other persistent atrial fibrillation: Secondary | ICD-10-CM

## 2020-07-23 DIAGNOSIS — Z79899 Other long term (current) drug therapy: Secondary | ICD-10-CM | POA: Insufficient documentation

## 2020-07-23 DIAGNOSIS — Z7901 Long term (current) use of anticoagulants: Secondary | ICD-10-CM | POA: Insufficient documentation

## 2020-07-23 DIAGNOSIS — I428 Other cardiomyopathies: Secondary | ICD-10-CM | POA: Diagnosis not present

## 2020-07-23 DIAGNOSIS — I2510 Atherosclerotic heart disease of native coronary artery without angina pectoris: Secondary | ICD-10-CM | POA: Diagnosis not present

## 2020-07-23 DIAGNOSIS — I5042 Chronic combined systolic (congestive) and diastolic (congestive) heart failure: Secondary | ICD-10-CM | POA: Insufficient documentation

## 2020-07-23 DIAGNOSIS — Z8249 Family history of ischemic heart disease and other diseases of the circulatory system: Secondary | ICD-10-CM | POA: Insufficient documentation

## 2020-07-23 LAB — CBC
HCT: 52.7 % — ABNORMAL HIGH (ref 39.0–52.0)
Hemoglobin: 17.4 g/dL — ABNORMAL HIGH (ref 13.0–17.0)
MCH: 29.4 pg (ref 26.0–34.0)
MCHC: 33 g/dL (ref 30.0–36.0)
MCV: 89 fL (ref 80.0–100.0)
Platelets: 246 10*3/uL (ref 150–400)
RBC: 5.92 MIL/uL — ABNORMAL HIGH (ref 4.22–5.81)
RDW: 13.3 % (ref 11.5–15.5)
WBC: 7.8 10*3/uL (ref 4.0–10.5)
nRBC: 0 % (ref 0.0–0.2)

## 2020-07-23 LAB — BASIC METABOLIC PANEL
Anion gap: 9 (ref 5–15)
BUN: 17 mg/dL (ref 8–23)
CO2: 23 mmol/L (ref 22–32)
Calcium: 8.8 mg/dL — ABNORMAL LOW (ref 8.9–10.3)
Chloride: 102 mmol/L (ref 98–111)
Creatinine, Ser: 1.46 mg/dL — ABNORMAL HIGH (ref 0.61–1.24)
GFR, Estimated: 53 mL/min — ABNORMAL LOW (ref >60)
Glucose, Bld: 185 mg/dL — ABNORMAL HIGH (ref 70–99)
Potassium: 4.4 mmol/L (ref 3.5–5.1)
Sodium: 134 mmol/L — ABNORMAL LOW (ref 135–145)

## 2020-07-23 LAB — URINALYSIS, ROUTINE W REFLEX MICROSCOPIC
Bacteria, UA: NONE SEEN
Bilirubin Urine: NEGATIVE
Glucose, UA: 150 mg/dL — AB
Ketones, ur: 5 mg/dL — AB
Nitrite: NEGATIVE
Protein, ur: 100 mg/dL — AB
RBC / HPF: >50 RBC/hpf — ABNORMAL HIGH (ref 0–5)
Specific Gravity, Urine: 1.025 (ref 1.005–1.030)
pH: 5 (ref 5.0–8.0)

## 2020-07-23 NOTE — Progress Notes (Signed)
Advanced Heart Failure Clinic Note   PCP: Colin Benton, DO  HF Cardiologist:  Dr. [name]  HPI: Darius [name] is a 65 y.o. male with DM, paroxysmal Afib, history of left atrial appendage thrombus (on Eliquis), OSA, chronic systolic CHF, NICM (EF 62%) felt to be tachycardia mediated.   Admitted 03/15/95-9/89/21 for acute systolic CHF and atrial fibrillation with RVR. Had a R/L heart cath on 04/21/16, no CAD, right heart pressures elevated, low output. Echo showed EF 15%. Plan was to undergo TEE/DC-CV, however he was found to have a left atrial appendage thrombus and had only started Eliquis 5 days prior. He was discharged with plans for TEE/DC-CV in about a month. He was started on Entresto 24/26 mg BID during admission. Discharged home on Lasix 40 mg daily, digoxin 0.126m, and metoprolol XL 50 mg BID. Discharge 280 pounds.   Repeat TEE done in 4/18 for possible cardioversion.  EF remained 25-30%.  He still had a small, amorphous LA appendage thrombus (smaller than prior but still present).  He admitted to having missed some Eliquis doses.    He finally had TEE-guided DCCV in 7/18 with conversion to NSR.  TEE showed EF 40-45%.  After this, he went back into atrial fibrilation.  Given poor compliance with followup and recurrent atrial fibrillation, it was decided to leave him in atrial fibrillation as rate was well-controlled.    Echo in 12/21 showed that EF remains 55% with mild LVH, mildly decreased RV systolic function, IVC normal.   He returns today for followup of CHF and atrial fibrillation.  He remains in atrial fibrillation.  Weight is down 8 lbs.  No significant exertional dyspnea.  No chest pain.  He has been having hematuria recently.  No lightheadedness.    ECG (personally reviewed): atrial fibrillation, inferior Qs  Cardiac Studies  RHC/LHC 04/21/2016  RA mean 14 RV 45/15 PA 50/28, mean 36 PCWP mean 22 LV 104/20 AO 101/75 Oxygen saturations: PA 59% AO 92% Cardiac Output (Fick) 4.85   Cardiac Index (Fick) 1.89 PVR 2.9 WU  - TEE: 04/2016 with LAA thrombus.  - TEE: 4/18 with EF 25-30%, diffuse hypokinesis, RV mildly dilated with moderately decreased systolic function, amorphous thrombus LA appendage (improved compared to 3/18).  -TEE 09/08/2016 EF 40-45% No thrombus. Successful DC-CV  - ECHO 04/24/2016 EF 15% Peak PA pressure 38 mm hg  - ECHO 8/19: EF 55-60% - ECHO 12/21: EF remains 55% with mild LVH, mildly decreased RV systolic function, IVC normal.    Labs  04/24/2016: K 4.5 Creatinine 1.00, digoxin < 0.2 4/18: K 4.3, creatinine 1.02 10/18: Thyroid panel normal, K 4.6, creatinine 1.05, LFTs normal, hgb 14.4 11/18: LDL 42, HDL 62 1/19: K 4.6, creatinine 0.97, LFTs normal, BNP 129 9/21: LDL 65, HDl 53, K 4.9, creatinine 1.24 3/22: K 4.2, creatinine 1.38  Review of systems complete and found to be negative unless listed in HPI.   SH:  Social History   Socioeconomic History   Marital status: Single    Spouse name: Not on file   Number of children: 0   Years of education: 12   Highest education level: Not on file  Occupational History   Occupation: unemployeed  Tobacco Use   Smoking status: Never   Smokeless tobacco: Never  Vaping Use   Vaping Use: Never used  Substance and Sexual Activity   Alcohol use: Yes    Comment: some   Drug use: No   Sexual activity: Never  Other Topics Concern  Not on file  Social History Narrative   Admits that he eats poorly and eats a lot at work.      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   Social Determinants of Health   Financial Resource Strain: Not on file  Food Insecurity: Not on file  Transportation Needs: Not on file  Physical Activity: Not on file  Stress: Not on file  Social Connections: Not on file  Intimate Partner Violence: Not on file    FH:  Family History  Problem Relation Age of Onset   Diabetes Father        died in his 13s   Hypertension Father    Seizures Mother         died in her 74s   Hypertension Sister    Hypertension Paternal Grandfather    Heart attack Neg Hx    Stroke Neg Hx     Past Medical History:  Diagnosis Date   Atrial fibrillation ([place])    unable to tolerate versed and fentanyl sedation and required gen anesthesia for TEE;  s/p TEE-DCCV (failed);  Amiodarone started   CHF (congestive heart failure) ([facility])    Chronic diastolic heart failure ([place])    a. Echo 03/27/11: EF 50-55%, inferior hypokinesis, moderate LAE, mild RVE, normal pulmonary pressures. ;   b.  TEE (04/03/11): EF 40-45%, mild MR moderate LAE, no LAA clot, mild RAE;  c.  Echocardiogram (02/2013): EF 60-65%, Gr 2 DD, mildly dilated Ao root (Ao root dimension 39 mm), MAC, mild LAE, normal RVF   Diabetes mellitus, type 2 ([place])    Hx of cardiovascular stress test    Lexiscan Myoview (02/2013): No ischemia or scar, EF 51%, low risk   Morbid obesity ([facility])    Sleep apnea    a. sleep test 01/2013:  very severy OSA    Current Outpatient Medications  Medication Sig Dispense Refill   apixaban (ELIQUIS) 5 MG TABS tablet TAKE 1 TABLET (5 MG TOTAL) BY MOUTH 2 (TWO) TIMES DAILY. 60 tablet 0   atorvastatin (LIPITOR) 80 MG tablet TAKE 1 TABLET BY MOUTH AT BEDTIME. 30 tablet 6   metFORMIN (GLUCOPHAGE) 1000 MG tablet TAKE 1 TABLET BY MOUTH TWICE DAILY WITH FOOD. 60 tablet 1   metoprolol succinate (TOPROL-XL) 100 MG 24 hr tablet TAKE 1 TABLET BY MOUTH TWICE DAILY WITH OR IMMEDIATELY FOLLOWING A MEAL. 60 tablet 3   sacubitril-valsartan (ENTRESTO) 97-103 MG TAKE 1 TABLET BY MOUTH 2 (TWO) TIMES DAILY. 60 tablet 3   spironolactone (ALDACTONE) 25 MG tablet Take 1 tablet (25 mg total) by mouth daily. 30 tablet 0   No current facility-administered medications for this encounter.    Vitals:   07/23/20 1222  BP: 112/70  Pulse: 81  SpO2: 94%  Weight: (!) 137.4 kg (303 lb)   Wt Readings from Last 3 Encounters:  07/23/20 (!) 137.4 kg (303 lb)  01/18/20 (!) 141.1 kg (311 lb)  10/20/19 (!)  140.2 kg (309 lb)    PHYSICAL EXAM: General: NAD Neck: No JVD, no thyromegaly or thyroid nodule.  Lungs: Clear to auscultation bilaterally with normal respiratory effort. CV: Nondisplaced PMI.  Heart irregular S1/S2, no S3/S4, no murmur.  No peripheral edema.  No carotid bruit.  Normal pedal pulses.  Abdomen: Soft, nontender, no hepatosplenomegaly, no distention.  Skin: Intact without lesions or rashes.  Neurologic: Alert and oriented x 3.  Psych: Normal affect. Extremities: No clubbing or cyanosis.  HEENT: Normal.    ASSESSMENT &  PLAN:  1. Chronic systolic => diastolic CHF: Had [facility] 09/1101 showing minimal CAD.  Nonischemic cardiomyopathy, possibly tachycardia-mediated in the setting of rapid atrial fibrillation.   Echo 04/2016 EF 15% but improved to 40-45% on TEE in 7/18 and 55-60% by 8/19 echo.  Echo in 12/21 showed that EF remains 55% despite being back in atrial fibrillation.  Rate is reasonably controlled. He is not volume overloaded on exam, NYHA class II symptoms. - Continue Entresto 97/103 bid.     - Continue spironolactone 25 mg daily. BMET today.  - Continue Toprol XL 100 mg bid. - Would consider [facility], but would like to get hematuria worked up first.  2. Atrial fibrillation: Persistent atrial fibrillation.  Was thought to have tachy-mediated CMP in past, but EF 55% on 12/21 echo in setting of good rate control (HR in 70s).  We have decided not to cardiovert him again as he broke through amiodarone and is not particularly compliant.  He is now off amiodarone.  - Continue apixaban 5 mg bid.   3. HLD: Continue statin, good lipids in 9/21.  4. OSA: Needs appoint with sleep medicine to get back on CPAP, will try to arrange.  5. Diabetes: Per primary, has neuropathy.  6. Hematuria: Check UA today and refer to urology.   Followup in 3 months with APP.    Darius [name] 07/23/2020

## 2020-07-23 NOTE — Patient Instructions (Signed)
Routine lab work today. Will notify you of abnormal results  You have been referred to Urology  They will contact you to schedule your appointment  Follow up in 3 months.   Do the following things EVERYDAY: Weigh yourself in the morning before breakfast. Write it down and keep it in a log. Take your medicines as prescribed Eat low salt foods--Limit salt (sodium) to 2000 mg per day.  Stay as active as you can everyday Limit all fluids for the day to less than 2 liters

## 2020-07-25 ENCOUNTER — Other Ambulatory Visit (HOSPITAL_COMMUNITY): Payer: Self-pay

## 2020-07-25 ENCOUNTER — Other Ambulatory Visit (HOSPITAL_COMMUNITY): Payer: Self-pay | Admitting: Cardiology

## 2020-07-25 ENCOUNTER — Other Ambulatory Visit (HOSPITAL_COMMUNITY): Payer: Self-pay | Admitting: Family Medicine

## 2020-07-25 DIAGNOSIS — E119 Type 2 diabetes mellitus without complications: Secondary | ICD-10-CM

## 2020-07-25 MED ORDER — ENTRESTO 97-103 MG PO TABS
1.0000 | ORAL_TABLET | Freq: Two times a day (BID) | ORAL | 3 refills | Status: DC
  Filled 2020-07-25 – 2020-09-12 (×2): qty 60, 30d supply, fill #0
  Filled 2020-12-05: qty 60, 30d supply, fill #1
  Filled 2021-01-15: qty 60, 30d supply, fill #2
  Filled 2021-02-12 – 2021-05-01 (×2): qty 60, 30d supply, fill #0

## 2020-07-25 MED ORDER — METFORMIN HCL 1000 MG PO TABS
1000.0000 mg | ORAL_TABLET | Freq: Two times a day (BID) | ORAL | 1 refills | Status: DC
  Filled 2020-07-25: qty 60, 30d supply, fill #0

## 2020-07-25 MED FILL — Metoprolol Succinate Tab ER 24HR 100 MG (Tartrate Equiv): ORAL | 30 days supply | Qty: 60 | Fill #1 | Status: AC

## 2020-07-25 MED FILL — Atorvastatin Calcium Tab 80 MG (Base Equivalent): ORAL | 30 days supply | Qty: 30 | Fill #1 | Status: AC

## 2020-07-27 ENCOUNTER — Encounter (HOSPITAL_COMMUNITY): Payer: Medicare HMO | Admitting: Cardiology

## 2020-07-30 ENCOUNTER — Ambulatory Visit (INDEPENDENT_AMBULATORY_CARE_PROVIDER_SITE_OTHER): Payer: Medicare HMO | Admitting: Nurse Practitioner

## 2020-07-30 VITALS — BP 155/69 | HR 97 | Temp 97.9°F | Resp 18 | Ht 74.0 in | Wt 305.0 lb

## 2020-07-30 DIAGNOSIS — Z0000 Encounter for general adult medical examination without abnormal findings: Secondary | ICD-10-CM | POA: Diagnosis not present

## 2020-07-30 NOTE — Patient Instructions (Addendum)
Darius [name] , Thank you for taking time to come for your Medicare Wellness Visit. I appreciate your ongoing commitment to your health goals. Please review the following plan we discussed and let me know if I can assist you in the future.   These are the goals we discussed:  Goals      DIET - REDUCE FAT INTAKE        This is a list of the screening recommended for you and due dates:  Health Maintenance  Topic Date Due   Colon Cancer Screening  Never done   Zoster (Shingles) Vaccine (1 of 2) Never done   Complete foot exam   04/04/2020   Hemoglobin A1C  04/18/2020   COVID-19 Vaccine (4 - Booster for Pfizer series) 05/13/2020   Pneumonia vaccines (1 of 2 - PCV13) 10/19/2020*   Tetanus Vaccine  11/13/2026*   Eye exam for diabetics  08/15/2020   Flu Shot  09/10/2020   Hepatitis C Screening: USPSTF Recommendation to screen - Ages 18-79 yo.  Completed   HPV Vaccine  Aged Out  *Topic was postponed. The date shown is not the original due date.    Colonoscopy, Adult A colonoscopy is a procedure to look at the entire large intestine. This procedure is done using a long, thin, flexible tube that has a camera on theend. You may have a colonoscopy: As a part of normal colorectal screening. If you have certain symptoms, such as: A low number of red blood cells in your blood (anemia). Diarrhea that does not go away. Pain in your abdomen. Blood in your stool. A colonoscopy can help screen for and diagnose medical problems, including: Tumors. Extra tissue that grows where mucus forms (polyps). Inflammation. Areas of bleeding. Tell your health care provider about: Any allergies you have. All medicines you are taking, including vitamins, herbs, eye drops, creams, and over-the-counter medicines. Any problems you or family members have had with anesthetic medicines. Any blood disorders you have. Any surgeries you have had. Any medical conditions you have. Any problems you have had with  having bowel movements. Whether you are pregnant or may be pregnant. What are the risks? Generally, this is a safe procedure. However, problems may occur, including: Bleeding. Damage to your intestine. Allergic reactions to medicines given during the procedure. Infection. This is rare. What happens before the procedure? Eating and drinking restrictions Follow instructions from your health care provider about eating or drinking restrictions, which may include: A few days before the procedure: Follow a low-fiber diet. Avoid nuts, seeds, dried fruit, raw fruits, and vegetables. 1-3 days before the procedure: Eat only gelatin dessert or ice pops. Drink only clear liquids, such as water, clear juice, clear broth or bouillon, black coffee or tea, or clear soft drinks or sports drinks. Avoid liquids that contain red or purple dye. The day of the procedure: Do not eat solid foods. You may continue to drink clear liquids until up to 2 hours before the procedure. Do not eat or drink anything starting 2 hours before the procedure, or within the time period that your health care provider recommends. Bowel prep If you were prescribed a bowel prep to take by mouth (orally) to clean out your colon: Take it as told by your health care provider. Starting the day before your procedure, you will need to drink a large amount of liquid medicine. The liquid will cause you to have many bowel movements of loose stool until your stool becomes almost clear or light  green. If your skin or the opening between the buttocks (anus) gets irritated from diarrhea, you may relieve the irritation using: Wipes with medicine in them, such as adult wet wipes with aloe and vitamin E. A product to soothe skin, such as petroleum jelly. If you vomit while drinking the bowel prep: Take a break for up to 60 minutes. Begin the bowel prep again. Call your health care provider if you keep vomiting or you cannot take the bowel prep  without vomiting. To clean out your colon, you may also be given: Laxative medicines. These help you have a bowel movement. Instructions for enema use. An enema is liquid medicine injected into your rectum. Medicines Ask your health care provider about: Changing or stopping your regular medicines or supplements. This is especially important if you are taking iron supplements, diabetes medicines, or blood thinners. Taking medicines such as aspirin and ibuprofen. These medicines can thin your blood. Do not take these medicines unless your health care provider tells you to take them. Taking over-the-counter medicines, vitamins, herbs, and supplements. General instructions Ask your health care provider what steps will be taken to help prevent infection. These may include washing skin with a germ-killing soap. Plan to have someone take you home from the hospital or clinic. What happens during the procedure?  An IV will be inserted into one of your veins. You may be given one or more of the following: A medicine to help you relax (sedative). A medicine to numb the area (local anesthetic). A medicine to make you fall asleep (general anesthetic). This is rarely needed. You will lie on your side with your knees bent. The tube will: Have oil or gel put on it (be lubricated). Be inserted into your anus. Be gently eased through all parts of your large intestine. Air will be sent into your colon to keep it open. This may cause some pressure or cramping. Images will be taken with the camera and will appear on a screen. A small tissue sample may be removed to be looked at under a microscope (biopsy). The tissue may be sent to a lab for testing if any signs of problems are found. If small polyps are found, they may be removed and checked for cancer cells. When the procedure is finished, the tube will be removed. The procedure may vary among health care providers and hospitals. What happens after the  procedure? Your blood pressure, heart rate, breathing rate, and blood oxygen level will be monitored until you leave the hospital or clinic. You may have a small amount of blood in your stool. You may pass gas and have mild cramping or bloating in your abdomen. This is caused by the air that was used to open your colon during the exam. Do not drive for 24 hours after the procedure. It is up to you to get the results of your procedure. Ask your health care provider, or the department that is doing the procedure, when your results will be ready. Summary A colonoscopy is a procedure to look at the entire large intestine. Follow instructions from your health care provider about eating and drinking before the procedure. If you were prescribed an oral bowel prep to clean out your colon, take it as told by your health care provider. During the colonoscopy, a flexible tube with a camera on its end is inserted into the anus and then passed into the other parts of the large intestine. This information is not intended to replace advice given  to you by your health care provider. Make sure you discuss any questions you have with your healthcare provider. Document Revised: 08/20/2018 Document Reviewed: 08/20/2018 Elsevier Patient Education  [place] and Cholesterol Restricted Eating Plan Eating a diet that limits fat and cholesterol may help lower your risk for heart disease and other conditions. Your body needs fat and cholesterol for basic functions, but eating too much of these things can be harmful to yourhealth. Your health care provider may order lab tests to check your blood fat (lipid) and cholesterol levels. This helps your health care provider understand your risk for certain conditions and whether you need to make diet changes. Work with your health care provider or dietitian to make an eating plan that isright for you. Your plan includes: Limit your fat intake to ______% or less of  your total calories a day. Limit your saturated fat intake to ______% or less of your total calories a day. Limit the amount of cholesterol in your diet to less than _________mg a day. Eat ___________ g of fiber a day. What are tips for following this plan? General guidelines  If you are overweight, work with your health care provider to lose weight safely. Losing just 5-10% of your body weight can improve your overall health and help prevent diseases such as diabetes and heart disease. Avoid: Foods with added sugar. Fried foods. Foods that contain partially hydrogenated oils, including stick margarine, some tub margarines, cookies, crackers, and other baked goods. Limit alcohol intake to no more than 1 drink a day for nonpregnant women and 2 drinks a day for men. One drink equals 12 oz of beer, 5 oz of wine, or 1 oz of hard liquor.  Reading food labels Check food labels for: Trans fats, partially hydrogenated oils, or high amounts of saturated fat. Avoid foods that contain saturated fat and trans fat. The amount of cholesterol in each serving. Try to eat no more than 200 mg of cholesterol each day. The amount of fiber in each serving. Try to eat at least 20-30 g of fiber each day. Choose foods with healthy fats, such as: Monounsaturated and polyunsaturated fats. These include olive and canola oil, flaxseeds, walnuts, almonds, and seeds. Omega-3 fats. These are found in foods such as salmon, mackerel, sardines, tuna, flaxseed oil, and ground flaxseeds. Choose grain products that have whole grains. Look for the word "whole" as the first word in the ingredient list. Cooking Cook foods using methods other than frying. Baking, boiling, grilling, and broiling are some healthy options. Eat more home-cooked food and less restaurant, buffet, and fast food. Avoid cooking using saturated fats. Animal sources of saturated fats include meats, butter, and cream. Plant sources of saturated fats  include palm oil, palm kernel oil, and coconut oil. Meal planning  At meals, imagine dividing your plate into fourths: Fill one-half of your plate with vegetables and green salads. Fill one-fourth of your plate with whole grains. Fill one-fourth of your plate with lean protein foods. Eat fish that is high in omega-3 fats at least two times a week. Eat more foods that contain fiber, such as whole grains, beans, apples, broccoli, carrots, peas, and barley. These foods help promote healthy cholesterol levels in the blood.  Recommended foods Grains Whole grains, such as whole wheat or whole grain breads, crackers, cereals, and pasta. Unsweetened oatmeal, bulgur, barley, quinoa, or brown rice. Corn or whole wheat flour tortillas. Vegetables Fresh or frozen vegetables (raw, steamed, roasted, or grilled).  Green salads. Fruits All fresh, canned (in natural juice), or frozen fruits. Meats and other protein foods Ground beef (85% or leaner), grass-fed beef, or beef trimmed of fat. Skinless chicken or [place]. Ground chicken or [place]. Pork trimmed of fat. All fish and seafood. Egg whites. Dried beans, peas, or lentils. Unsalted nuts or seeds. Unsalted canned beans. Natural nut butters without added sugar and oil. Dairy Low-fat or nonfat dairy products, such as skim or 1% milk, 2% or reduced-fat cheeses, low-fat and fat-free ricotta or cottage cheese, or plain low-fat and nonfat yogurt. Fats and oils Tub margarine without trans fats. Light or reduced-fat mayonnaise and salad dressings. Avocado. Olive, canola, sesame, or safflower oils. The items listed above may not be a complete list of foods and beverages you can eat. Contact a dietitian for more information. Foods to avoid Grains White bread. White pasta. White rice. Cornbread. Bagels, pastries, and croissants. Crackers and snack foods that contain trans fat and hydrogenated oils. Vegetables Vegetables cooked in cheese, cream, or butter sauce.  Fried vegetables. Fruits Canned fruit in heavy syrup. Fruit in cream or butter sauce. Fried fruit. Meats and other protein foods Fatty cuts of meat. Ribs, chicken wings, bacon, sausage, bologna, salami, chitterlings, fatback, hot dogs, bratwurst, and packaged lunch meats. Liver and organ meats. Whole eggs and egg yolks. Chicken and [place] with skin. Fried meat. Dairy Whole or 2% milk, cream, half-and-half, and cream cheese. Whole milk cheeses. Whole-fat or sweetened yogurt. Full-fat cheeses. Nondairy creamers and whipped toppings. Processed cheese, cheese spreads, and cheese curds. Beverages Alcohol. Sugar-sweetened drinks such as sodas, lemonade, and fruit drinks. Fats and oils Butter, stick margarine, lard, shortening, ghee, or bacon fat. Coconut, palm kernel, and palm oils. Sweets and desserts Corn syrup, sugars, honey, and molasses. Candy. Jam and jelly. Syrup. Sweetened cereals. Cookies, pies, cakes, donuts, muffins, and ice cream. The items listed above may not be a complete list of foods and beverages you should avoid. Contact a dietitian for more information. Summary Your body needs fat and cholesterol for basic functions. However, eating too much of these things can be harmful to your health. Work with your health care provider and dietitian to follow a diet low in fat and cholesterol. Doing this may help lower your risk for heart disease and other conditions. Choose healthy fats, such as monounsaturated and polyunsaturated fats, and foods high in omega-3 fatty acids. Eat fiber-rich foods, such as whole grains, beans, peas, fruits, and vegetables. Limit or avoid alcohol, fried foods, and foods high in saturated fats, partially hydrogenated oils, and sugar. This information is not intended to replace advice given to you by your health care provider. Make sure you discuss any questions you have with your healthcare provider. Document Revised: 09/28/2019 Document Reviewed:  06/01/2019 Elsevier Patient Education  2022 [place] for Type 2 Diabetes  A screening test for type 2 diabetes (type 2 diabetes mellitus) is a blood test to measure your blood sugar (glucose) level. This test is done to check for early signs of diabetes, before youdevelop symptoms.  Type 2 diabetes is a long-term (chronic) disease. In type 2 diabetes, one or both of these problems may be present: The pancreas does not make enough of a hormone called insulin. Cells in the body do not respond properly to insulin that the body makes (insulin resistance). Normally, insulin allows blood sugar (glucose) to enter cells in the body. The cells use glucose for energy. Insulin resistance or lack of insulin causes excess glucose  to build up in the blood instead of going into cells. This results in high blood glucose levels (hyperglycemia), which can cause many complications. You may be screened for type 2 diabetes as part of your regular health care, especially if you have a high risk for diabetes. Screening can help to identify type 2 diabetes at its early stage (prediabetes). Identifying and treating prediabetes may delay or prevent the development oftype 2 diabetes. What are the risk factors for type 2 diabetes? The following factors may make you more likely to develop type 2 diabetes: Having a parent or sibling (first-degree relative) who has diabetes. Being overweight or obese. Being of American-Indian, African-American, Hispanic, Latino, Asian, or [place] descent. Not getting enough exercise (having a sedentary lifestyle). Being older than age 11. Having a history of diabetes during pregnancy (gestational diabetes). Having low levels of good cholesterol (HDL-C) or high levels of blood fats (triglycerides). Having high blood glucose in a previous blood test. Having high blood pressure. Having certain diseases or conditions that may be caused by insulin resistance,  including: Acanthosis nigricans. This is a condition that causes dark skin on the neck, armpits, and groin. Polycystic ovary syndrome (PCOS). Cardiovascular heart disease. Who should be screened for type 2 diabetes? Adults Adults age 32 and older. These adults should be screened once every three years. Adults who are younger than age 51, are overweight, and have one other risk factor. These adults should be screened once every three years. Adults who have normal blood glucose levels and two or more risk factors. These adults may be screened once every year (annually). Women who have had gestational diabetes in the past. These women should be screened once every three years. Pregnant women who have risk factors. These women should be screened at their first prenatal visit and again between weeks 24 and 28 of pregnancy. Children and adolescents Children and adolescents should be screened for type 2 diabetes if they are overweight and have any of the following risk factors: A family history of type 2 diabetes. Being a member of a high-risk ethnic group. Signs of insulin resistance or conditions that are associated with insulin resistance. A mother who had gestational diabetes while pregnant with him or her. Screening should be done at least once every three years, starting at age 58 or at the onset of puberty, whichever comes first. Your health care provider or your child's health care provider may recommendhaving a screening more or less often. What happens during screening? During screening, your health care provider may ask questions about: Your health and your risk factors, including your activity level and any medical conditions that you have. The health of your first-degree relatives. Past pregnancies, if this applies. Your health care provider will also do a physical exam, including a blood pressure measurement and blood tests. There are four blood tests that can be used to screen for  type 2 diabetes. You may have one or more of the following: A fasting blood glucose (FBG) test. You will not be allowed to eat (you will fast) for 8 hours or more before a blood sample is taken. A random blood glucose test. This test checks your blood glucose at any time of the day regardless of when you ate. An oral glucose tolerance test (OGTT). This test measures your blood glucose at two times: After you have not eaten (have fasted) overnight. This is your baseline glucose level. Two hours after you drink a glucose-containing beverage. An A1c (hemoglobin A1c) blood  test. This test provides information about blood glucose control over the previous 2-3 months. What do the results mean? Your test results are a measurement of how much glucose is in your blood. Normal blood glucose levels mean that you do not have diabetes or prediabetes. High blood glucose levels may mean that you have prediabetes or diabetes.Depending on the results, other tests may be needed to confirm the diagnosis. You may be diagnosed with type 2 diabetes if: Your FBG level is 126 mg/dL (7.0 mmol/L) or higher. Your random blood glucose level is 200 mg/dL (11.1 mmol/L) or higher. Your A1c level is 6.5% or higher. Your OGTT result is higher than 200 mg/dL (11.1 mmol/L). These blood tests may be repeated to confirm your diagnosis. Talk with yourhealth care provider about what your results mean. Summary A screening test for type 2 diabetes (type 2 diabetes mellitus) is a blood test to measure your blood sugar (glucose) level. Know what your risk factors are for developing type 2 diabetes. If you are at risk, get screening tests as often as told by your health care provider. Screening may help you identify type 2 diabetes at its early stage (prediabetes). Identifying and treating prediabetes may delay or prevent the development of type 2 diabetes. This information is not intended to replace advice given to you by your health care  provider. Make sure you discuss any questions you have with your healthcare provider. Document Revised: 01/11/2020 Document Reviewed: 08/24/2019 Elsevier Patient Education  2022 [place].  Diabetes Mellitus and [place] care is an important part of your health, especially when you have diabetes. Diabetes may cause you to have problems because of poor blood flow (circulation) to your feet and legs, which can cause your skin to: Become thinner and drier. Break more easily. Heal more slowly. Peel and crack. You may also have nerve damage (neuropathy) in your legs and feet, causing decreased feeling in them. This means that you may not notice minor injuries to your feet that could lead to more serious problems. Noticing and addressing any potential problems early is the best wayto prevent future foot problems. How to care for your feet Foot hygiene  Wash your feet daily with warm water and mild soap. Do not use hot water. Then, pat your feet and the areas between your toes until they are completely dry. Do not soak your feet as this can dry your skin. Trim your toenails straight across. Do not dig under them or around the cuticle. File the edges of your nails with an emery board or nail file. Apply a moisturizing lotion or petroleum jelly to the skin on your feet and to dry, brittle toenails. Use lotion that does not contain alcohol and is unscented. Do not apply lotion between your toes.  Shoes and socks Wear clean socks or stockings every day. Make sure they are not too tight. Do not wear knee-high stockings since they may decrease blood flow to your legs. Wear shoes that fit properly and have enough cushioning. Always look in your shoes before you put them on to be sure there are no objects inside. To break in new shoes, wear them for just a few hours a day. This prevents injuries on your feet. Wounds, scrapes, corns, and calluses  Check your feet daily for blisters, cuts, bruises,  sores, and redness. If you cannot see the bottom of your feet, use a mirror or ask someone for help. Do not cut corns or calluses or try  to remove them with medicine. If you find a minor scrape, cut, or break in the skin on your feet, keep it and the skin around it clean and dry. You may clean these areas with mild soap and water. Do not clean the area with peroxide, alcohol, or iodine. If you have a wound, scrape, corn, or callus on your foot, look at it several times a day to make sure it is healing and not infected. Check for: Redness, swelling, or pain. Fluid or blood. Warmth. Pus or a bad smell.  General tips Do not cross your legs. This may decrease blood flow to your feet. Do not use heating pads or hot water bottles on your feet. They may burn your skin. If you have lost feeling in your feet or legs, you may not know this is happening until it is too late. Protect your feet from hot and cold by wearing shoes, such as at the beach or on hot pavement. Schedule a complete foot exam at least once a year (annually) or more often if you have foot problems. Report any cuts, sores, or bruises to your health care provider immediately. Where to find more information American Diabetes Association: www.diabetes.org Association of Diabetes Care & Education Specialists: www.diabeteseducator.org Contact a health care provider if: You have a medical condition that increases your risk of infection and you have any cuts, sores, or bruises on your feet. You have an injury that is not healing. You have redness on your legs or feet. You feel burning or tingling in your legs or feet. You have pain or cramps in your legs and feet. Your legs or feet are numb. Your feet always feel cold. You have pain around any toenails. Get help right away if: You have a wound, scrape, corn, or callus on your foot and: You have pain, swelling, or redness that gets worse. You have fluid or blood coming from the wound,  scrape, corn, or callus. Your wound, scrape, corn, or callus feels warm to the touch. You have pus or a bad smell coming from the wound, scrape, corn, or callus. You have a fever. You have a red line going up your leg. Summary Check your feet every day for blisters, cuts, bruises, sores, and redness. Apply a moisturizing lotion or petroleum jelly to the skin on your feet and to dry, brittle toenails. Wear shoes that fit properly and have enough cushioning. If you have foot problems, report any cuts, sores, or bruises to your health care provider immediately. Schedule a complete foot exam at least once a year (annually) or more often if you have foot problems. This information is not intended to replace advice given to you by your health care provider. Make sure you discuss any questions you have with your healthcare provider. Document Revised: 08/18/2019 Document Reviewed: 08/18/2019 Elsevier Patient Education  2022 [place] Prevention in the Home, Adult Falls can cause injuries and can happen to people of all ages. There are many things you can do to make your home safe and to help prevent falls. Ask forhelp when making these changes. What actions can I take to prevent falls? General Instructions Use good lighting in all rooms. Replace any light bulbs that burn out. Turn on the lights in dark areas. Use night-lights. Keep items that you use often in easy-to-reach places. Lower the shelves around your home if needed. Set up your furniture so you have a clear path. Avoid moving your furniture around. Do not  have throw rugs or other things on the floor that can make you trip. Avoid walking on wet floors. If any of your floors are uneven, fix them. Add color or contrast paint or tape to clearly mark and help you see: Grab bars or handrails. First and last steps of staircases. Where the edge of each step is. If you use a stepladder: Make sure that it is fully opened. Do not  climb a closed stepladder. Make sure the sides of the stepladder are locked in place. Ask someone to hold the stepladder while you use it. Know where your pets are when moving through your home. What can I do in the bathroom?     Keep the floor dry. Clean up any water on the floor right away. Remove soap buildup in the tub or shower. Use nonskid mats or decals on the floor of the tub or shower. Attach bath mats securely with double-sided, nonslip rug tape. If you need to sit down in the shower, use a plastic, nonslip stool. Install grab bars by the toilet and in the tub and shower. Do not use towel bars as grab bars. What can I do in the bedroom? Make sure that you have a light by your bed that is easy to reach. Do not use any sheets or blankets for your bed that hang to the floor. Have a firm chair with side arms that you can use for support when you get dressed. What can I do in the kitchen? Clean up any spills right away. If you need to reach something above you, use a step stool with a grab bar. Keep electrical cords out of the way. Do not use floor polish or wax that makes floors slippery. What can I do with my stairs? Do not leave any items on the stairs. Make sure that you have a light switch at the top and the bottom of the stairs. Make sure that there are handrails on both sides of the stairs. Fix handrails that are broken or loose. Install nonslip stair treads on all your stairs. Avoid having throw rugs at the top or bottom of the stairs. Choose a carpet that does not hide the edge of the steps on the stairs. Check carpeting to make sure that it is firmly attached to the stairs. Fix carpet that is loose or worn. What can I do on the outside of my home? Use bright outdoor lighting. Fix the edges of walkways and driveways and fix any cracks. Remove anything that might make you trip as you walk through a door, such as a raised step or threshold. Trim any bushes or trees on  paths to your home. Check to see if handrails are loose or broken and that both sides of all steps have handrails. Install guardrails along the edges of any raised decks and porches. Clear paths of anything that can make you trip, such as tools or rocks. Have leaves, snow, or ice cleared regularly. Use sand or salt on paths during winter. Clean up any spills in your garage right away. This includes grease or oil spills. What other actions can I take? Wear shoes that: Have a low heel. Do not wear high heels. Have rubber bottoms. Feel good on your feet and fit well. Are closed at the toe. Do not wear open-toe sandals. Use tools that help you move around if needed. These include: Canes. Walkers. Scooters. Crutches. Review your medicines with your doctor. Some medicines can make you feel dizzy.  This can increase your chance of falling. Ask your doctor what else you can do to help prevent falls. Where to find more information Centers for Disease Control and Prevention, [employer]: http://www.wolf.info/ National Institute on Aging: http://kim-miller.com/ Contact a doctor if: You are afraid of falling at home. You feel weak, drowsy, or dizzy at home. You fall at home. Summary There are many simple things that you can do to make your home safe and to help prevent falls. Ways to make your home safe include removing things that can make you trip and installing grab bars in the bathroom. Ask for help when making these changes in your home. This information is not intended to replace advice given to you by your health care provider. Make sure you discuss any questions you have with your healthcare provider. Document Revised: 08/31/2019 Document Reviewed: 08/31/2019 Elsevier Patient Education  [place] Maintenance, Male Adopting a healthy lifestyle and getting preventive care are important in promoting health and wellness. Ask your health care provider about: The right schedule for you to have  regular tests and exams. Things you can do on your own to prevent diseases and keep yourself healthy. What should I know about diet, weight, and exercise? Eat a healthy diet  Eat a diet that includes plenty of vegetables, fruits, low-fat dairy products, and lean protein. Do not eat a lot of foods that are high in solid fats, added sugars, or sodium.  Maintain a healthy weight Body mass index (BMI) is a measurement that can be used to identify possible weight problems. It estimates body fat based on height and weight. Your health care provider can help determine your BMI and help you achieve or maintain ahealthy weight. Get regular exercise Get regular exercise. This is one of the most important things you can do for your health. Most adults should: Exercise for at least 150 minutes each week. The exercise should increase your heart rate and make you sweat (moderate-intensity exercise). Do strengthening exercises at least twice a week. This is in addition to the moderate-intensity exercise. Spend less time sitting. Even light physical activity can be beneficial. Watch cholesterol and blood lipids Have your blood tested for lipids and cholesterol at 67 years of age, then havethis test every 5 years. You may need to have your cholesterol levels checked more often if: Your lipid or cholesterol levels are high. You are older than 67 years of age. You are at high risk for heart disease. What should I know about cancer screening? Many types of cancers can be detected early and may often be prevented. Depending on your health history and family history, you may need to have cancer screening at various ages. This may include screening for: Colorectal cancer. Prostate cancer. Skin cancer. Lung cancer. What should I know about heart disease, diabetes, and high blood pressure? Blood pressure and heart disease High blood pressure causes heart disease and increases the risk of stroke. This is more  likely to develop in people who have high blood pressure readings, are of African descent, or are overweight. Talk with your health care provider about your target blood pressure readings. Have your blood pressure checked: Every 3-5 years if you are 19-51 years of age. Every year if you are 1 years old or older. If you are between the ages of 1 and 35 and are a current or former smoker, ask your health care provider if you should have a one-time screening for abdominal aortic aneurysm (AAA). Diabetes  Have regular diabetes screenings. This checks your fasting blood sugar level. Have the screening done: Once every three years after age 48 if you are at a normal weight and have a low risk for diabetes. More often and at a younger age if you are overweight or have a high risk for diabetes. What should I know about preventing infection? Hepatitis B If you have a higher risk for hepatitis B, you should be screened for this virus. Talk with your health care provider to find out if you are at risk forhepatitis B infection. Hepatitis C Blood testing is recommended for: Everyone born from [birth year] through 1967. Anyone with known risk factors for hepatitis C. Sexually transmitted infections (STIs) You should be screened each year for STIs, including gonorrhea and chlamydia, if: You are sexually active and are younger than 67 years of age. You are older than 67 years of age and your health care provider tells you that you are at risk for this type of infection. Your sexual activity has changed since you were last screened, and you are at increased risk for chlamydia or gonorrhea. Ask your health care provider if you are at risk. Ask your health care provider about whether you are at high risk for HIV. Your health care provider may recommend a prescription medicine to help prevent HIV infection. If you choose to take medicine to prevent HIV, you should first get tested for HIV. You should then be tested every  3 months for as long as you are taking the medicine. Follow these instructions at home: Lifestyle Do not use any products that contain nicotine or tobacco, such as cigarettes, e-cigarettes, and chewing tobacco. If you need help quitting, ask your health care provider. Do not use street drugs. Do not share needles. Ask your health care provider for help if you need support or information about quitting drugs. Alcohol use Do not drink alcohol if your health care provider tells you not to drink. If you drink alcohol: Limit how much you have to 0-2 drinks a day. Be aware of how much alcohol is in your drink. In the [place] one drink equals one 12 oz bottle of beer (355 mL), one 5 oz glass of wine (148 mL), or one 1 oz glass of hard liquor (44 mL). General instructions Schedule regular health, dental, and eye exams. Stay current with your vaccines. Tell your health care provider if: You often feel depressed. You have ever been abused or do not feel safe at home. Summary Adopting a healthy lifestyle and getting preventive care are important in promoting health and wellness. Follow your health care provider's instructions about healthy diet, exercising, and getting tested or screened for diseases. Follow your health care provider's instructions on monitoring your cholesterol and blood pressure. This information is not intended to replace advice given to you by your health care provider. Make sure you discuss any questions you have with your healthcare provider. Document Revised: 01/20/2018 Document Reviewed: 01/20/2018 Elsevier Patient Education  20[place].  Steps to Quit Smoking Smoking tobacco is the leading cause of preventable death. It can affect almost every organ in the body. Smoking puts you and people around you at risk for many serious, long-lasting (chronic) diseases. Quitting smoking can be hard, but it is one of the best things thatyou can do for your health. It is never too  late to quit. How do I get ready to quit? When you decide to quit smoking, make a plan to help you  succeed. Before you quit: Pick a date to quit. Set a date within the next 2 weeks to give you time to prepare. Write down the reasons why you are quitting. Keep this list in places where you will see it often. Tell your family, friends, and co-workers that you are quitting. Their support is important. Talk with your doctor about the choices that may help you quit. Find out if your health insurance will pay for these treatments. Know the people, places, things, and activities that make you want to smoke (triggers). Avoid them. What first steps can I take to quit smoking? Throw away all cigarettes at home, at work, and in your car. Throw away the things that you use when you smoke, such as ashtrays and lighters. Clean your car. Make sure to empty the ashtray. Clean your home, including curtains and carpets. What can I do to help me quit smoking? Talk with your doctor about taking medicines and seeing a counselor at the same time. You are more likely to succeed when you do both. If you are pregnant or breastfeeding, talk with your doctor about counseling or other ways to quit smoking. Do not take medicine to help you quit smoking unless your doctor tells you to do so. To quit smoking: Quit right away Quit smoking totally, instead of slowly cutting back on how much you smoke over a period of time. Go to counseling. You are more likely to quit if you go to counseling sessions regularly. Take medicine You may take medicines to help you quit. Some medicines need a prescription, and some you can buy over-the-counter. Some medicines may contain a drug called nicotine to replace the nicotine in cigarettes. Medicines may: Help you to stop having the desire to smoke (cravings). Help to stop the problems that come when you stop smoking (withdrawal symptoms). Your doctor may ask you to use: Nicotine  patches, gum, or lozenges. Nicotine inhalers or sprays. Non-nicotine medicine that is taken by mouth. Find resources Find resources and other ways to help you quit smoking and remain smoke-free after you quit. These resources are most helpful when you use them often. They include: Online chats with a Social worker. Phone quitlines. Printed Furniture conservator/restorer. Support groups or group counseling. Text messaging programs. Mobile phone apps. Use apps on your mobile phone or tablet that can help you stick to your quit plan. There are many free apps for mobile phones and tablets as well as websites. Examples include Quit Guide from the [employer] and smokefree.gov  What things can I do to make it easier to quit?  Talk to your family and friends. Ask them to support and encourage you. Call a phone quitline (1-800-QUIT-NOW), reach out to support groups, or work with a Social worker. Ask people who smoke to not smoke around you. Avoid places that make you want to smoke, such as: Bars. Parties. Smoke-break areas at work. Spend time with people who do not smoke. Lower the stress in your life. Stress can make you want to smoke. Try these things to help your stress: Getting regular exercise. Doing deep-breathing exercises. Doing yoga. Meditating. Doing a body scan. To do this, close your eyes, focus on one area of your body at a time from head to toe. Notice which parts of your body are tense. Try to relax the muscles in those areas. How will I feel when I quit smoking? Day 1 to 3 weeks Within the first 24 hours, you may start to have some problems that  come from quitting tobacco. These problems are very bad 2-3 days after you quit, but they do not often last for more than 2-3 weeks. You may get these symptoms: Mood swings. Feeling restless, nervous, angry, or annoyed. Trouble concentrating. Dizziness. Strong desire for high-sugar foods and nicotine. Weight gain. Trouble pooping (constipation). Feeling  like you may vomit (nausea). Coughing or a sore throat. Changes in how the medicines that you take for other issues work in your body. Depression. Trouble sleeping (insomnia). Week 3 and afterward After the first 2-3 weeks of quitting, you may start to notice more positive results, such as: Better sense of smell and taste. Less coughing and sore throat. Slower heart rate. Lower blood pressure. Clearer skin. Better breathing. Fewer sick days. Quitting smoking can be hard. Do not give up if you fail the first time. Some people need to try a few times before they succeed. Do your best to stick to your quit plan, and talk with yourdoctor if you have any questions or concerns. Summary Smoking tobacco is the leading cause of preventable death. Quitting smoking can be hard, but it is one of the best things that you can do for your health. When you decide to quit smoking, make a plan to help you succeed. Quit smoking right away, not slowly over a period of time. When you start quitting, seek help from your doctor, family, or friends. This information is not intended to replace advice given to you by your health care provider. Make sure you discuss any questions you have with your healthcare provider. Document Revised: 10/22/2018 Document Reviewed: 04/17/2018 Elsevier Patient Education  [place].

## 2020-07-30 NOTE — Progress Notes (Signed)
Subjective:   Darius [name] is a 65 y.o. male who presents for Medicare Annual/Subsequent preventive examination.  Review of Systems    Review of Systems  Constitutional: Negative.   HENT: Negative.    Eyes: Negative.   Respiratory: Negative.    Cardiovascular: Negative.   Gastrointestinal: Negative.   Genitourinary: Negative.   Musculoskeletal: Negative.   Skin: Negative.   Neurological: Negative.   Endo/Heme/Allergies: Negative.   Psychiatric/Behavioral: Negative.     Cardiac Risk Factors include: advanced age (>56mn, >>35women);diabetes mellitus;dyslipidemia;male gender;obesity (BMI >30kg/m2);sedentary lifestyle     Objective:    Today's Vitals   07/30/20 1115  BP: (!) 155/69  Pulse: 97  Resp: 18  Temp: 97.9 F (36.6 C)  SpO2: 95%  Weight: (!) 305 lb (138.3 kg)  Height: _0  (1.88 m)   Body mass index is 39.16 kg/m.  Advanced Directives 04/15/2018 02/14/2018 05/26/2016 04/18/2016 04/17/2016 10/04/2015 09/18/2015  Does Patient Have a Medical Advance Directive? _1  No No  Would patient like information on creating a medical advance directive? No - Patient declined No - Patient declined No - Patient declined No - Patient declined - No - patient declined information -  Pre-existing out of facility DNR order (yellow form or pink MOST form) - - - - - - -    Current Medications (verified) Outpatient Encounter Medications as of 07/30/2020  Medication Sig   apixaban (ELIQUIS) 5 MG TABS tablet TAKE 1 TABLET (5 MG TOTAL) BY MOUTH 2 (TWO) TIMES DAILY.   atorvastatin (LIPITOR) 80 MG tablet TAKE 1 TABLET BY MOUTH AT BEDTIME.   metFORMIN (GLUCOPHAGE) 1000 MG tablet Take 1 tablet (1,000 mg total) by mouth 2 (two) times daily with food.   metoprolol succinate (TOPROL-XL) 100 MG 24 hr tablet TAKE 1 TABLET BY MOUTH TWICE DAILY WITH OR IMMEDIATELY FOLLOWING A MEAL.   sacubitril-valsartan (ENTRESTO) 97-103 MG Take 1 tablet by mouth 2 (two) times daily.   spironolactone  (ALDACTONE) 25 MG tablet Take 1 tablet (25 mg total) by mouth daily.   [DISCONTINUED] amiodarone (PACERONE) 200 MG tablet TAKE 1 TABLET (200 MG TOTAL) BY MOUTH DAILY. LAST REFILL WITHOUT OFFICE VISIT. PLEASE CALL [phone]  No facility-administered encounter medications on file as of 07/30/2020.    Allergies (verified) Patient has no known allergies.   History: Past Medical History:  Diagnosis Date   Atrial fibrillation ([facility] [place]    unable to tolerate versed and fentanyl sedation and required gen anesthesia for TEE;  s/p TEE-DCCV (failed);  Amiodarone started   CHF (congestive heart failure) (HCC)    Chronic diastolic heart failure ([facility]    a. Echo 03/27/11: EF 50-55%, inferior hypokinesis, moderate LAE, mild RVE, normal pulmonary pressures. ;   b.  TEE (04/03/11): EF 40-45%, mild MR moderate LAE, no LAA clot, mild RAE;  c.  Echocardiogram (02/2013): EF 60-65%, Gr 2 DD, mildly dilated Ao root (Ao root dimension 39 mm), MAC, mild LAE, normal RVF   Diabetes mellitus, type 2 ([facility]    Hx of cardiovascular stress test    Lexiscan Myoview (02/2013): No ischemia or scar, EF 51%, low risk   Morbid obesity (HCC)    Sleep apnea    a. sleep test 01/2013:  very severy OSA   Past Surgical History:  Procedure Laterality Date   CARDIOVERSION  04/01/2011   Procedure: CARDIOVERSION;  Surgeon: BLelon Perla MD;  [place]  Service: Cardiovascular;  Laterality: N/A;   CARDIOVERSION  04/03/2011  Procedure: CARDIOVERSION;  Surgeon: Lelon Perla, MD;  Location: [facility] ENDOSCOPY;  Service: Cardiovascular;  Laterality: N/A;   CARDIOVERSION  04/03/2011   Procedure: CARDIOVERSION;  Surgeon: Lelon Perla, MD;  Location: [place];  Service: Cardiovascular;  Laterality: N/A;   CARDIOVERSION N/A 06/15/2014   Procedure: CARDIOVERSION;  Surgeon: [name], MD;  Location: [place];  Service: Cardiovascular;  Laterality: N/A;   CARDIOVERSION N/A 09/08/2016   Procedure: CARDIOVERSION;  Surgeon:  [name], MD;  Location: [facility] ENDOSCOPY;  Service: Cardiovascular;  Laterality: N/A;   I & D EXTREMITY Right 09/19/2015   Procedure: IRRIGATION AND DEBRIDEMENT EXTREMITY;  Surgeon: Mcarthur Rossetti, MD;  Location: [place];  Service: Orthopedics;  Laterality: Right;   RIGHT/LEFT HEART CATH AND CORONARY ANGIOGRAPHY N/A 04/21/2016   Procedure: Right/Left Heart Cath and Coronary Angiography;  Surgeon: [name], MD;  Location: [place] CV LAB;  Service: Cardiovascular;  Laterality: N/A;   TEE WITHOUT CARDIOVERSION  04/01/2011   Procedure: TRANSESOPHAGEAL ECHOCARDIOGRAM (TEE);  Surgeon: Lelon Perla, MD;  Location: [facility] ENDOSCOPY;  Service: Cardiovascular;  Laterality: N/A;   TEE WITHOUT CARDIOVERSION  04/03/2011   Procedure: TRANSESOPHAGEAL ECHOCARDIOGRAM (TEE);  Surgeon: Lelon Perla, MD;  Location[facility]COPY;  Service: Cardiovascular;  Laterality: N/A;   TEE WITHOUT CARDIOVERSION  04/03/2011   Procedure: TRANSESOPHAGEAL ECHOCARDIOGRAM (TEE);  Surgeon: Lelon Perla, MD;  Location: [place];  Service: Cardiovascular;  Laterality: N/A;   TEE WITHOUT CARDIOVERSION N/A 04/24/2016   Procedure: TRANSESOPHAGEAL ECHOCARDIOGRAM (TEE);  Surgeon: [name], MD;  Location: [place];  Service: Cardiovascular;  Laterality: N/A;   TEE WITHOUT CARDIOVERSION N/A 05/26/2016   Procedure: TRANSESOPHAGEAL ECHOCARDIOGRAM (TEE);  Surgeon: [name], MD;  Location: [place];  Service: Cardiovascular;  Laterality: N/A;   TEE WITHOUT CARDIOVERSION N/A 09/08/2016   Procedure: TRANSESOPHAGEAL ECHOCARDIOGRAM (TEE);  Surgeon: [name], MD;  Location: [facility] ENDOSCOPY;  Service: Cardiovascular;  Laterality: N/A;   Family History  Problem Relation Age of Onset   Diabetes Father        died in his 51s   Hypertension Father    Seizures Mother        died in her 10s   Hypertension Sister    Hypertension Paternal Grandfather    Heart attack Neg Hx    Stroke Neg Hx    Social History    Socioeconomic History   Marital status: Single    Spouse name: Not on file   Number of children: 0   Years of education: 12   Highest education level: Not on file  Occupational History   Occupation: unemployeed  Tobacco Use   Smoking status: Never   Smokeless tobacco: Never  Vaping Use   Vaping Use: Never used  Substance and Sexual Activity   Alcohol use: Yes    Comment: some   Drug use: No   Sexual activity: Never  Other Topics Concern   Not on file  Social History Narrative   Admits that he eats poorly and eats a lot at work.      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   Social Determinants of Health   Financial Resource Strain: High Risk   Difficulty of Paying Living Expenses: Hard  Food Insecurity: Food Insecurity Present   Worried About Running Out of Food in the Last Year: Sometimes true   Ran Out of Food in the Last Year: Sometimes true  Transportation Needs: No Transportation Needs  Lack of Transportation (Medical): No   Lack of Transportation (Non-Medical): No  Physical Activity: Inactive   Days of Exercise per Week: 0 days   Minutes of Exercise per Session: 0 min  Stress: Stress Concern Present   Feeling of Stress : To some extent  Social Connections: Socially Isolated   Frequency of Communication with Friends and Family: More than three times a week   Frequency of Social Gatherings with Friends and Family: Never   Attends Religious Services: Never   Printmaker: No   Attends Music therapist: Never   Marital Status: Never married    Tobacco Counseling Counseling given: Yes   Clinical Intake:  Pre-visit preparation completed: No  Pain : No/denies pain     BMI - recorded: 39.16 Nutritional Status: BMI > 30  Obese Nutritional Risks: None Diabetes: Yes CBG done?: No Did pt. bring in CBG monitor from home?: No  How often do you need to have someone help you when you read  instructions, pamphlets, or other written materials from your doctor or pharmacy?: 1 - Never What is the last grade level you completed in school?: 12th grade  Diabetic? yes   Interpreter Needed?: No      Activities of Daily Living In your present state of health, do you have any difficulty performing the following activities: 07/30/2020  Hearing? N  Vision? N  Difficulty concentrating or making decisions? N  Walking or climbing stairs? N  Dressing or bathing? N  Doing errands, shopping? N  Preparing Food and eating ? N  Using the Toilet? N  In the past six months, have you accidently leaked urine? N  Do you have problems with loss of bowel control? N  Managing your Medications? N  Managing your Finances? N  Housekeeping or managing your Housekeeping? N  Some recent data might be hidden    Patient Care Team: Vevelyn Francois, NP as PCP - General (Adult Health Nurse Practitioner) [name], MD as Consulting Physician (Cardiology)  Indicate any recent Medical Services you may have received from other than [facility] providers in the past year (date may be approximate).     Assessment:   This is a routine wellness examination for Cong.  Dietary issues and exercise activities discussed: Current Exercise Habits: The patient does not participate in regular exercise at present   Goals Addressed             This Visit's Progress    DIET - REDUCE FAT INTAKE         Depression Screen PHQ 2/9 Scores 07/30/2020 10/20/2019 07/27/2019 04/05/2019 10/20/2018 07/16/2018 03/10/2018  PHQ - 2 Score 0 0 0 0 0 0 0  Exception Documentation - Medical reason - - - - -    Fall Risk Fall Risk  07/30/2020 10/20/2019 07/27/2019 04/05/2019 10/20/2018  Falls in the past year? 0 0 0 0 0  Number falls in past yr: 0 0 - - -  Injury with Fall? 0 0 - - -  Risk for fall due to : No Fall Risks - - - -  Follow up Education provided - - - -    FALL RISK PREVENTION PERTAINING TO THE HOME:  Any stairs in  or around the home? No  If so, are there any without handrails? Yes  Home free of loose throw rugs in walkways, pet beds, electrical cords, etc? Yes  Adequate lighting in your home to reduce risk of falls?  Yes   ASSISTIVE DEVICES UTILIZED TO PREVENT FALLS:  Life alert? No  Use of a cane, walker or w/c? No  Grab bars in the bathroom? No  Shower chair or bench in shower? No  Elevated toilet seat or a handicapped toilet? No   TIMED UP AND GO:  Was the test performed? Yes .  Length of time to ambulate 10 feet: 3 sec.   Gait steady and fast without use of assistive device  Cognitive Function: MMSE - Mini Mental State Exam 07/30/2020  Orientation to time 5  Orientation to Place 5  Registration 3  Attention/ Calculation 5  Recall 3  Language- name 2 objects 2  Language- repeat 1  Language- follow 3 step command 3  Language- read & follow direction 1  Write a sentence 1  Copy design 1  Total score 30        Immunizations Immunization History  Administered Date(s) Administered   Influenza,inj,Quad PF,6+ Mos 11/12/2016, 12/07/2017, 10/20/2018   PFIZER(Purple Top)SARS-COV-2 Vaccination 05/26/2019, 06/14/2019, 01/13/2020   Pneumococcal Polysaccharide-23 03/27/2011    TDAP status: Up to date  Flu Vaccine status: Up to date  Pneumococcal vaccine status: Due, Education has been provided regarding the importance of this vaccine. Advised may receive this vaccine at local pharmacy or Health Dept. Aware to provide a copy of the vaccination record if obtained from local pharmacy or Health Dept. Verbalized acceptance and understanding.  Covid-19 vaccine status: Completed vaccines  Qualifies for Shingles Vaccine? No   Zostavax completed No   Shingrix Completed?: No.    Education has been provided regarding the importance of this vaccine. Patient has been advised to call insurance company to determine out of pocket expense if they have not yet received this vaccine. Advised may also  receive vaccine at local pharmacy or Health Dept. Verbalized acceptance and understanding.  Screening Tests Health Maintenance  Topic Date Due   COLONOSCOPY (Pts 45-9yr Insurance coverage will need to be confirmed)  Never done   Zoster Vaccines- Shingrix (1 of 2) Never done   FOOT EXAM  04/04/2020   HEMOGLOBIN A1C  04/18/2020   COVID-19 Vaccine (4 - Booster for Pfizer series) 05/13/2020   PNA vac Low Risk Adult (1 of 2 - PCV13) 10/19/2020 (Originally 01/09/2019)   TETANUS/TDAP  11/13/2026 (Originally 01/08/1975)   OPHTHALMOLOGY EXAM  08/15/2020   INFLUENZA VACCINE  09/10/2020   Hepatitis C Screening  Completed   HPV VACCINES  Aged Out    Health Maintenance  Health Maintenance Due  Topic Date Due   COLONOSCOPY (Pts 45-457yrInsurance coverage will need to be confirmed)  Never done   Zoster Vaccines- Shingrix (1 of 2) Never done   FOOT EXAM  04/04/2020   HEMOGLOBIN A1C  04/18/2020   COVID-19 Vaccine (4 - Booster for Pfizer series) 05/13/2020    Colorectal cancer screening: Due - PCP to order  Lung Cancer Screening: (Low Dose CT Chest recommended if Age 67-80ears, 30 pack-year currently smoking OR have quit w/in 15years.) does not qualify.   Lung Cancer Screening Referral: NA  Additional Screening:  Hepatitis C Screening: does qualify; Completed   Vision Screening: Recommended annual ophthalmology exams for early detection of glaucoma and other disorders of the eye. Is the patient up to date with their annual eye exam?  Yes  Who is the provider or what is the name of the office in which the patient attends annual eye exams? [place] not [place] with a provider, would  they like to be referred to a provider to establish care?  NA .   Dental Screening: Recommended annual dental exams for proper oral hygiene  Community Resource Referral / Chronic Care Management: CRR required this visit?  No   CCM required this visit?  No      Plan:     I have  personally reviewed and noted the following in the patient's chart:   Medical and social history Use of alcohol, tobacco or illicit drugs  Current medications and supplements including opioid prescriptions. Patient is not currently taking opioid prescriptions. Functional ability and status Nutritional status Physical activity Advanced directives List of other physicians Hospitalizations, surgeries, and ER visits in previous 12 months Vitals Screenings to include cognitive, depression, and falls Referrals and appointments  In addition, I have reviewed and discussed with patient certain preventive protocols, quality metrics, and best practice recommendations. A written personalized care plan for preventive services as well as general preventive health recommendations were provided to patient.     Fenton Foy, NP   07/30/2020

## 2020-07-31 ENCOUNTER — Encounter: Payer: Self-pay | Admitting: *Deleted

## 2020-07-31 ENCOUNTER — Telehealth (HOSPITAL_COMMUNITY): Payer: Self-pay | Admitting: *Deleted

## 2020-07-31 DIAGNOSIS — G4733 Obstructive sleep apnea (adult) (pediatric): Secondary | ICD-10-CM

## 2020-07-31 NOTE — Telephone Encounter (Signed)
Hildred Alamin nurse case manager left vm for medication clarification and asked if pt should be on amiodarone. I called her back at [phone] and left detailed vm that patient is no longer on amiodarone but to give me a call back if any other questions.

## 2020-08-01 ENCOUNTER — Telehealth: Payer: Self-pay | Admitting: *Deleted

## 2020-08-01 DIAGNOSIS — G4733 Obstructive sleep apnea (adult) (pediatric): Secondary | ICD-10-CM

## 2020-08-01 NOTE — Telephone Encounter (Signed)
Per [employer] patient needs a cpap to Bipap titration and this has been ordered and sent to insurance for approval. Patient states when he was suppose to go be tested covid restrictions prevented him from going.

## 2020-08-01 NOTE — Telephone Encounter (Signed)
-----   Message from Sueanne Margarita, MD sent at 07/24/2020  7:02 AM EDT ----- Gae Bon this patient had a split night sleep study ordered over a year ago and never got it done - please get this set up ----- Message ----- From: [name], MD Sent: 07/23/2020  10:35 PM EDT To: Sueanne Margarita, MD  Traci, this patient has OSA but needs to get set up for CPAP.  Could you help arrange?

## 2020-08-01 NOTE — Telephone Encounter (Signed)
This encounter was created in error - please disregard.

## 2020-08-02 ENCOUNTER — Other Ambulatory Visit (HOSPITAL_COMMUNITY): Payer: Self-pay

## 2020-08-03 NOTE — Telephone Encounter (Signed)
-----   Message from Freada Bergeron, [place] sent at 08/01/2020 10:17 AM EDT ----- Regarding: precert Cpap titration

## 2020-08-03 NOTE — Telephone Encounter (Signed)
[employer] Auth received for CPAP titration. Ok to schedule. Auth # [id number]. Valid dates 09/10/20 to 01/30/21. Cll routed to [facility] for scheduling.

## 2020-08-15 NOTE — Telephone Encounter (Signed)
Patient is scheduled for lab study on 10/19/20. Patient understands his sleep study will be done at [facility] sleep lab. Patient understands he will receive a sleep packet in a week or so. Patient understands to call if he does not receive the sleep packet in a timely manner. Patient agrees with treatment and thanked me for call asked me to leave a message as well.  Left detailed message on voicemail with date and time of titration and informed patient to call back to confirm or reschedule.

## 2020-09-10 ENCOUNTER — Encounter: Payer: Self-pay | Admitting: Urology

## 2020-09-10 ENCOUNTER — Ambulatory Visit (INDEPENDENT_AMBULATORY_CARE_PROVIDER_SITE_OTHER): Payer: Medicare HMO | Admitting: Urology

## 2020-09-10 ENCOUNTER — Other Ambulatory Visit: Payer: Self-pay

## 2020-09-10 VITALS — BP 144/91 | HR 84

## 2020-09-10 DIAGNOSIS — R310 Gross hematuria: Secondary | ICD-10-CM

## 2020-09-10 DIAGNOSIS — R3915 Urgency of urination: Secondary | ICD-10-CM

## 2020-09-10 LAB — URINALYSIS, ROUTINE W REFLEX MICROSCOPIC
Bilirubin, UA: NEGATIVE
Glucose, UA: NEGATIVE
Nitrite, UA: NEGATIVE
Specific Gravity, UA: >1.03 — ABNORMAL HIGH (ref 1.005–1.030)
Urobilinogen, Ur: 1 mg/dL (ref 0.2–1.0)
pH, UA: 5.5 (ref 5.0–7.5)

## 2020-09-10 NOTE — Progress Notes (Signed)
09/10/2020 10:12 AM   Aleen Sells [birth year] [id number]  Referring provider: [name], MD [phone],  [place] [address]  Chief complaint: Hematuria  HPI:  1) gross hematuria-patient developed blood in urine for a couple of months. On and off. Clears quickly. A 07/23/2020 urinalysis showed greater than 50 red blood cells per high-powered field.  Creatinine 1.1-1.5 range.  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   UA today clear.   2) AUASS = 14. Urgency and nocturia. Of note, he has a sleep study pending from pulmonology.    He has a history of A. fib and an atrial thrombus and is on Eliquis.   Today, seen for the above.      PMH: Past Medical History:  Diagnosis Date   Atrial fibrillation ([place])    unable to tolerate versed and fentanyl sedation and required gen anesthesia for TEE;  s/p TEE-DCCV (failed);  Amiodarone started   CHF (congestive heart failure) (HCC)    Chronic diastolic heart failure ([place])    a. Echo 03/27/11: EF 50-55%, inferior hypokinesis, moderate LAE, mild RVE, normal pulmonary pressures. ;   b.  TEE (04/03/11): EF 40-45%, mild MR moderate LAE, no LAA clot, mild RAE;  c.  Echocardiogram (02/2013): EF 60-65%, Gr 2 DD, mildly dilated Ao root (Ao root dimension 39 mm), MAC, mild LAE, normal RVF   Diabetes mellitus, type 2 ([place])    Hx of cardiovascular stress test    Lexiscan Myoview (02/2013): No ischemia or scar, EF 51%, low risk   Morbid obesity (HCC)    Sleep apnea    a. sleep test 01/2013:  very severy OSA    Surgical History: Past Surgical History:  Procedure Laterality Date   CARDIOVERSION  04/01/2011   Procedure: CARDIOVERSION;  Surgeon: Lelon Perla, MD;  Location: [place] [facility];  Service: Cardiovascular;  Laterality: N/A;   CARDIOVERSION  04/03/2011   Procedure: CARDIOVERSION;  Surgeon: Lelon Perla, MD;  Location: [facility] ENDOSCOPY;  Service: Cardiovascular;  Laterality: N/A;   CARDIOVERSION   04/03/2011   Procedure: CARDIOVERSION;  Surgeon: Lelon Perla, MD;  Location: [place];  Service: Cardiovascular;  Laterality: N/A;   CARDIOVERSION N/A 06/15/2014   Procedure: CARDIOVERSION;  Surgeon: [name], MD;  Location: [facility] ENDOSCOPY;  Service: Cardiovascular;  Laterality: N/A;   CARDIOVERSION N/A 09/08/2016   Procedure: CARDIOVERSION;  Surgeon: [name], MD;  Location: [facility] ENDOSCOPY;  Service: Cardiovascular;  Laterality: N/A;   I & D EXTREMITY Right 09/19/2015   Procedure: IRRIGATION AND DEBRIDEMENT EXTREMITY;  Surgeon: Mcarthur Rossetti, MD;  Location: [facility];  Service: Orthopedics;  Laterality: Right;   RIGHT/LEFT HEART CATH AND CORONARY ANGIOGRAPHY N/A 04/21/2016   Procedure: Right/Left Heart Cath and Coronary Angiography;  Surgeon: [name], MD;  Location: [place] CV LAB;  Service: Cardiovascular;  Laterality: N/A;   TEE WITHOUT CARDIOVERSION  04/01/2011   Procedure: TRANSESOPHAGEAL ECHOCARDIOGRAM (TEE);  Surgeon: Lelon Perla, MD;  Location: [facility] ENDOSCOPY;  Service: Cardiovascular;  Laterality: N/A;   TEE WITHOUT CARDIOVERSION  04/03/2011   Procedure: TRANSESOPHAGEAL ECHOCARDIOGRAM (TEE);  Surgeon: Lelon Perla, MD;  Location: [facility] ENDOSCOPY;  Service: Cardiovascular;  Laterality: N/A;   TEE WITHOUT CARDIOVERSION  04/03/2011   Procedure: TRANSESOPHAGEAL ECHOCARDIOGRAM (TEE);  Surgeon: Lelon Perla, MD;  Location: [place];  Service: Cardiovascular;  Laterality: N/A;   TEE WITHOUT CARDIOVERSION N/A 04/24/2016   Procedure: TRANSESOPHAGEAL ECHOCARDIOGRAM (TEE);  Surgeon: [name],  MD;  Location: [place];  Service: Cardiovascular;  Laterality: N/A;   TEE WITHOUT CARDIOVERSION N/A 05/26/2016   Procedure: TRANSESOPHAGEAL ECHOCARDIOGRAM (TEE);  Surgeon: [name], MD;  Location: [place];  Service: Cardiovascular;  Laterality: N/A;   TEE WITHOUT CARDIOVERSION N/A 09/08/2016   Procedure: TRANSESOPHAGEAL ECHOCARDIOGRAM (TEE);  Surgeon: [name], MD;  Location: [facility] ENDOSCOPY;  Service: Cardiovascular;  Laterality: N/A;    Home Medications:  Allergies as of 09/10/2020   No Known Allergies      Medication List        Accurate as of September 10, 2020 10:12 AM. If you have any questions, ask your nurse or doctor.          atorvastatin 80 MG tablet Commonly known as: LIPITOR TAKE 1 TABLET BY MOUTH AT BEDTIME.   Eliquis 5 MG Tabs tablet Generic drug: apixaban TAKE 1 TABLET (5 MG TOTAL) BY MOUTH 2 (TWO) TIMES DAILY.   Entresto 97-103 MG Generic drug: sacubitril-valsartan Take 1 tablet by mouth 2 (two) times daily.   metFORMIN 1000 MG tablet Commonly known as: GLUCOPHAGE Take 1 tablet (1,000 mg total) by mouth 2 (two) times daily with food.   metoprolol succinate 100 MG 24 hr tablet Commonly known as: TOPROL-XL TAKE 1 TABLET BY MOUTH TWICE DAILY WITH OR IMMEDIATELY FOLLOWING A MEAL.   spironolactone 25 MG tablet Commonly known as: ALDACTONE Take 1 tablet (25 mg total) by mouth daily.        Allergies: No Known Allergies  Family History: Family History  Problem Relation Age of Onset   Diabetes Father        died in his 76s   Hypertension Father    Seizures Mother        died in her 44s   Hypertension Sister    Hypertension Paternal Grandfather    Heart attack Neg Hx    Stroke Neg Hx     Social History:  reports that he has never smoked. He has never used smokeless tobacco. He reports current alcohol use. He reports that he does not use drugs.   Physical Exam: BP (!) 144/91   Pulse 84   Constitutional:  Alert and oriented, No acute distress. HEENT: [place] AT, moist mucus membranes.  Trachea midline, no masses. Cardiovascular: No clubbing, cyanosis, or edema. Respiratory: Normal respiratory effort, no increased work of breathing. GI: Abdomen is soft, nontender, nondistended, no abdominal masses GU: No CVA tenderness  DRE: Prostate 30 g, smooth without hard area or nodule Lymph: No cervical or  inguinal lymphadenopathy. Skin: No rashes, bruises or suspicious lesions. Neurologic: Grossly intact, no focal deficits, moving all 4 extremities. Psychiatric: Normal mood and affect.  Laboratory Data: Lab Results  Component Value Date   WBC 7.8 07/23/2020   HGB 17.4 (H) 07/23/2020   HCT 52.7 (H) 07/23/2020   MCV 89.0 07/23/2020   PLT 246 07/23/2020    Lab Results  Component Value Date   CREATININE 1.46 (H) 07/23/2020    No results found for: PSA  No results found for: TESTOSTERONE  Lab Results  Component Value Date   HGBA1C 6.7 (A) 10/20/2019   HGBA1C 6.7 10/20/2019   HGBA1C 6.7 (A) 10/20/2019   HGBA1C 6.7 10/20/2019    Urinalysis    Component Value Date/Time   COLORURINE AMBER (A) 07/23/2020 1500   APPEARANCEUR CLOUDY (A) 07/23/2020 1500   LABSPEC 1.025 07/23/2020 1500   PHURINE 5.0 07/23/2020 1500   GLUCOSEU 150 (A) 07/23/2020 1500  HGBUR MODERATE (A) 07/23/2020 1500   BILIRUBINUR NEGATIVE 07/23/2020 1500   BILIRUBINUR neg 10/20/2019 1137   KETONESUR 5 (A) 07/23/2020 1500   PROTEINUR 100 (A) 07/23/2020 1500   UROBILINOGEN 0.2 10/20/2019 1137   UROBILINOGEN 0.2 02/13/2017 1540   NITRITE NEGATIVE 07/23/2020 1500   LEUKOCYTESUR SMALL (A) 07/23/2020 1500    Lab Results  Component Value Date   LABMICR 20.2 07/27/2019   BACTERIA NONE SEEN 07/23/2020    Pertinent Imaging: No results found for this or any previous visit.  No results found for this or any previous visit.  No results found for this or any previous visit.  No results found for this or any previous visit.  No results found for this or any previous visit.  No results found for this or any previous visit.  No results found for this or any previous visit.  No results found for this or any previous visit.   Assessment & Plan:    1. Gross hematuria Disc benign and malignant causes. Check PSA and bun, cr. Set up for CT with IV contrast and cystoscopy - he elects to proceed.  -  Urinalysis, Routine w reflex microscopic  2. Urgency - eval as above    No follow-ups on file.  Festus Aloe, MD  [facility]  [address] [place],  [address] [phone]

## 2020-09-10 NOTE — Progress Notes (Signed)
Urological Symptom Review  Patient is experiencing the following symptoms: Get up at night to urinate Leakage of urine Blood in urine   Review of Systems  Gastrointestinal (upper)  : Negative for upper GI symptoms  Gastrointestinal (lower) : Negative for lower GI symptoms  Constitutional : Negative for symptoms  Skin: Negative for skin symptoms  Eyes: Negative for eye symptoms  Ear/Nose/Throat : Negative for Ear/Nose/Throat symptoms  Hematologic/Lymphatic: Negative for Hematologic/Lymphatic symptoms  Cardiovascular : Negative for cardiovascular symptoms  Respiratory : Negative for respiratory symptoms  Endocrine: Negative for endocrine symptoms  Musculoskeletal: Negative for musculoskeletal symptoms  Neurological: Negative for neurological symptoms  Psychologic: Negative for psychiatric symptoms

## 2020-09-11 LAB — BASIC METABOLIC PANEL
BUN/Creatinine Ratio: 15 (ref 10–24)
BUN: 16 mg/dL (ref 8–27)
CO2: 21 mmol/L (ref 20–29)
Calcium: 9.1 mg/dL (ref 8.6–10.2)
Chloride: 102 mmol/L (ref 96–106)
Creatinine, Ser: 1.1 mg/dL (ref 0.76–1.27)
Glucose: 139 mg/dL — ABNORMAL HIGH (ref 65–99)
Potassium: 4.5 mmol/L (ref 3.5–5.2)
Sodium: 139 mmol/L (ref 134–144)
eGFR: 74 mL/min/{1.73_m2} (ref >59)

## 2020-09-11 LAB — PSA: Prostate Specific Ag, Serum: 6.9 ng/mL — ABNORMAL HIGH (ref 0.0–4.0)

## 2020-09-11 NOTE — Progress Notes (Signed)
Letter mailed

## 2020-09-12 ENCOUNTER — Other Ambulatory Visit (HOSPITAL_COMMUNITY): Payer: Self-pay

## 2020-09-12 ENCOUNTER — Other Ambulatory Visit (HOSPITAL_COMMUNITY): Payer: Self-pay | Admitting: Cardiology

## 2020-09-12 MED ORDER — SPIRONOLACTONE 25 MG PO TABS
25.0000 mg | ORAL_TABLET | Freq: Every day | ORAL | 0 refills | Status: DC
  Filled 2020-09-12: qty 40, 40d supply, fill #0

## 2020-09-12 MED FILL — Metoprolol Succinate Tab ER 24HR 100 MG (Tartrate Equiv): ORAL | 30 days supply | Qty: 60 | Fill #2 | Status: AC

## 2020-09-12 MED FILL — Atorvastatin Calcium Tab 80 MG (Base Equivalent): ORAL | 30 days supply | Qty: 30 | Fill #2 | Status: AC

## 2020-09-18 ENCOUNTER — Other Ambulatory Visit (HOSPITAL_COMMUNITY): Payer: Self-pay

## 2020-09-19 ENCOUNTER — Other Ambulatory Visit (HOSPITAL_COMMUNITY): Payer: Self-pay

## 2020-09-20 ENCOUNTER — Other Ambulatory Visit (HOSPITAL_COMMUNITY): Payer: Self-pay

## 2020-09-25 ENCOUNTER — Ambulatory Visit (HOSPITAL_COMMUNITY)
Admission: RE | Admit: 2020-09-25 | Discharge: 2020-09-25 | Disposition: A | Discharge status: Home / Self Care | Payer: Medicare HMO | Source: Ambulatory Visit | Attending: Urology | Admitting: Urology

## 2020-09-25 ENCOUNTER — Other Ambulatory Visit: Payer: Self-pay

## 2020-09-25 ENCOUNTER — Encounter (HOSPITAL_COMMUNITY): Payer: Self-pay

## 2020-09-25 DIAGNOSIS — R310 Gross hematuria: Secondary | ICD-10-CM | POA: Insufficient documentation

## 2020-09-25 DIAGNOSIS — R319 Hematuria, unspecified: Secondary | ICD-10-CM | POA: Diagnosis not present

## 2020-09-25 DIAGNOSIS — K8020 Calculus of gallbladder without cholecystitis without obstruction: Secondary | ICD-10-CM | POA: Diagnosis not present

## 2020-09-25 DIAGNOSIS — N289 Disorder of kidney and ureter, unspecified: Secondary | ICD-10-CM | POA: Diagnosis not present

## 2020-09-25 DIAGNOSIS — N200 Calculus of kidney: Secondary | ICD-10-CM | POA: Diagnosis not present

## 2020-09-25 MED ORDER — IOHEXOL 350 MG/ML SOLN
100.0000 mL | Freq: Once | INTRAVENOUS | Status: AC | PRN
  Administered 2020-09-25: 100 mL via INTRAVENOUS

## 2020-09-25 MED ORDER — SODIUM CHLORIDE 0.9 % IV SOLN
INTRAVENOUS | Status: AC
  Filled 2020-09-25: qty 250

## 2020-10-01 ENCOUNTER — Telehealth: Payer: Self-pay

## 2020-10-01 NOTE — Telephone Encounter (Signed)
-----   Message from Festus Aloe, MD sent at 09/30/2020  5:10 PM EDT ----- Let Darius [name] know there is a small growth in his bladder, so important to follow-up for cystoscopy as planned so that I can take a look in his bladder. Thanks.   ----- Message ----- From: Iris Pert, LPN Sent: [phone]   4:14 PM EDT To: Festus Aloe, MD  Please review

## 2020-10-01 NOTE — Telephone Encounter (Signed)
Patient called and made aware.

## 2020-10-19 ENCOUNTER — Ambulatory Visit (HOSPITAL_BASED_OUTPATIENT_CLINIC_OR_DEPARTMENT_OTHER): Payer: Medicare Other | Admitting: Cardiology

## 2020-10-22 NOTE — Progress Notes (Signed)
Advanced Heart Failure Clinic Note   PCP: Colin Benton, DO  HF Cardiologist:  Dr. [name]  HPI: Darius [name] is a 65 y.o. 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   Admitted 08/15/17-06/19/30 for acute systolic CHF and atrial fibrillation with RVR. Had a R/L heart cath on 04/21/16, no CAD, right heart pressures elevated, low output. Echo showed EF 15%. Plan was to undergo TEE/DC-CV, however he was found to have a left atrial appendage thrombus and had only started Eliquis 5 days prior. He was discharged with plans for TEE/DC-CV in about a month. He was started on Entresto 24/26 mg BID during admission. Discharged home on Lasix 40 mg daily, digoxin 0.133m, and metoprolol XL 50 mg BID. Discharge 280 pounds.   Repeat TEE done in 4/18 for possible cardioversion.  EF remained 25-30%.  He still had a small, amorphous LA appendage thrombus (smaller than prior but still present).  He admitted to having missed some Eliquis doses.    He finally had TEE-guided DCCV in 7/18 with conversion to NSR.  TEE showed EF 40-45%.  After this, he went back into atrial fibrilation.  Given poor compliance with followup and recurrent atrial fibrillation, it was decided to leave him in atrial fibrillation as rate was well-controlled.    Echo in 12/21 showed that EF remains 55% with mild LVH, mildly decreased RV systolic function, IVC normal.   Last seen by Dr. MAundra Dubinin June and was doing fairly well from cardiac standpoint.  In chronic Afib that was well rate controlled w/ NYHA Class II symptoms (stable). Only issue was noncardiac. He had noted painless hematuria. No med changes made. Recs were to consider later addition of an SLGT2i once hematuria had cleared. He was subsequently referred to urology for further w/u. He was also advised to f/u in sleep clinic to help obtain a new CPAP device. [facility]   He returns to clinic today for f/u. Still undergoing w/u w/ urology. In August had mildly elevated PSA at 6.9. F/u CT of abdomen/pelvis showed 2.5 x 2.0 cm polypoid lesion along the mucosal surface of the posterior right bladder wall near the UVJ, highly suspicious for urothelial neoplasm. Prostate unremarkable. Incidental finding of an 8 mm right lower lobe pulmonary nodule also noted.   Today in clinic, he appears stable from CHF standpoint. Wt is stable/unchanged at 305 lb. Remains NYHA Class II (stable). Denies CP. No palpitations. HR and BP controlled. Reports full med compliance. Still on Eliquis and reports mild painless hematuria on morning voids, clears throughout the day. He has f/u again w/ urology on 9/19. He was also scheduled for repeat sleep study this past week but candled the appt and plans to reschedule at a later time.    ECG (personally reviewed): not performed   Cardiac Studies  RHC/LHC 04/21/2016  RA mean 14 RV 45/15 PA 50/28, mean 36 PCWP mean 22 LV 104/20 AO 101/75 Oxygen saturations: PA 59% AO 92% Cardiac Output (Fick) 4.85  Cardiac Index (Fick) 1.89 PVR 2.9 WU  - TEE: 04/2016 with LAA thrombus.  - TEE: 4/18 with EF 25-30%, diffuse hypokinesis, RV mildly dilated with moderately decreased systolic function, amorphous thrombus LA appendage (improved compared to 3/18).  -TEE 09/08/2016 EF 40-45% No thrombus. Successful DC-CV  - ECHO 04/24/2016 EF 15% Peak PA pressure 38 mm hg  -  ECHO 8/19: EF 55-60% - ECHO 12/21: EF remains 55% with mild LVH, mildly decreased RV systolic function, IVC normal.    Labs  04/24/2016: K 4.5 Creatinine 1.00, digoxin < 0.2 4/18: K 4.3, creatinine 1.02 10/18: Thyroid panel normal, K 4.6, creatinine 1.05, LFTs normal, hgb 14.4 11/18: LDL 42, HDL 62 1/19: K 4.6, creatinine 0.97, LFTs normal, BNP 129 9/21: LDL 65, HDl 53, K 4.9, creatinine 1.24 3/22: K 4.2, creatinine 1.38 6/22: K 4.4, Scr 1.46. hgb 17.4  8.22: K  4.5, 1.10, PSA 6.9   Review of systems complete and found to be negative unless listed in HPI.   SH:  Social History   Socioeconomic History   Marital status: Single    Spouse name: Not on file   Number of children: 0   Years of education: 12   Highest education level: Not on file  Occupational History   Occupation: unemployeed  Tobacco Use   Smoking status: Never   Smokeless tobacco: Never  Vaping Use   Vaping Use: Never used  Substance and Sexual Activity   Alcohol use: Yes    Comment: some   Drug use: No   Sexual activity: Never  Other Topics Concern   Not on file  Social History Narrative   Admits that he eats poorly and eats a lot at work.      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   Social Determinants of Health   Financial Resource Strain: High Risk   Difficulty of Paying Living Expenses: Hard  Food Insecurity: Food Insecurity Present   Worried About Running Out of Food in the Last Year: Sometimes true   Ran Out of Food in the Last Year: Sometimes true  Transportation Needs: No Transportation Needs   Lack of Transportation (Medical): No   Lack of Transportation (Non-Medical): No  Physical Activity: Inactive   Days of Exercise per Week: 0 days   Minutes of Exercise per Session: 0 min  Stress: Stress Concern Present   Feeling of Stress : To some extent  Social Connections: Socially Isolated   Frequency of Communication with Friends and Family: More than three times a week   Frequency of Social Gatherings with Friends and Family: Never   Attends Religious Services: Never   Printmaker: No   Attends Music therapist: Never   Marital Status: Never married  Human resources officer Violence: Not At Risk   Fear of Current or Ex-Partner: No   Emotionally Abused: No   Physically Abused: No   Sexually Abused: No    FH:  Family History  Problem Relation Age of Onset   Diabetes Father        died in  his 73s   Hypertension Father    Seizures Mother        died in her 2s   Hypertension Sister    Hypertension Paternal Grandfather    Heart attack Neg Hx    Stroke Neg Hx     Past Medical History:  Diagnosis Date   Atrial fibrillation ([place])    unable to tolerate versed and fentanyl sedation and required gen anesthesia for TEE;  s/p TEE-DCCV (failed);  Amiodarone started   CHF (congestive heart failure) (HCC)    Chronic diastolic heart failure ([employer])    a. Echo 03/27/11: EF 50-55%, inferior hypokinesis, moderate LAE, mild RVE, normal pulmonary pressures. ;   b.  TEE (04/03/11): EF 40-45%, mild MR moderate LAE, no  LAA clot, mild RAE;  c.  Echocardiogram (02/2013): EF 60-65%, Gr 2 DD, mildly dilated Ao root (Ao root dimension 39 mm), MAC, mild LAE, normal RVF   Diabetes mellitus, type 2 ([place])    Hx of cardiovascular stress test    Lexiscan Myoview (02/2013): No ischemia or scar, EF 51%, low risk   Morbid obesity (HCC)    Sleep apnea    a. sleep test 01/2013:  very severy OSA    Current Outpatient Medications  Medication Sig Dispense Refill   apixaban (ELIQUIS) 5 MG TABS tablet TAKE 1 TABLET (5 MG TOTAL) BY MOUTH 2 (TWO) TIMES DAILY. 60 tablet 0   atorvastatin (LIPITOR) 80 MG tablet TAKE 1 TABLET BY MOUTH AT BEDTIME. 30 tablet 6   metFORMIN (GLUCOPHAGE) 1000 MG tablet Take 1 tablet (1,000 mg total) by mouth 2 (two) times daily with food. 60 tablet 1   metoprolol succinate (TOPROL-XL) 100 MG 24 hr tablet TAKE 1 TABLET BY MOUTH TWICE DAILY WITH OR IMMEDIATELY FOLLOWING A MEAL. 60 tablet 3   sacubitril-valsartan (ENTRESTO) 97-103 MG Take 1 tablet by mouth 2 (two) times daily. 60 tablet 3   spironolactone (ALDACTONE) 25 MG tablet Take 1 tablet (25 mg total) by mouth daily. 40 tablet 0   No current facility-administered medications for this encounter.    Vitals:   10/23/20 1129  BP: 132/78  Pulse: 93  SpO2: 95%  Weight: (!) 138.3 kg (305 lb)    Wt Readings from Last 3 Encounters:   10/23/20 (!) 138.3 kg (305 lb)  07/30/20 (!) 138.3 kg (305 lb)  07/23/20 (!) 137.4 kg (303 lb)    PHYSICAL EXAM: General:  Well appearing, moderately obese. No respiratory difficulty HEENT: normal Neck: supple. no JVD. Carotids 2+ bilat; no bruits. No lymphadenopathy or thyromegaly appreciated. Cor: PMI nondisplaced. Irregularly irregular rhythm and rate. No rubs, gallops or murmurs. Lungs: clear Abdomen: obese but soft, nontender, nondistended. No hepatosplenomegaly. No bruits or masses. Good bowel sounds. Extremities: no cyanosis, clubbing, rash, edema Neuro: alert & oriented x 3, cranial nerves grossly intact. moves all 4 extremities w/o difficulty. Affect pleasant.   ASSESSMENT & PLAN:  1. Chronic systolic => diastolic CHF: Had [facility] [id number]/6269 showing minimal CAD.  Nonischemic cardiomyopathy, possibly tachycardia-mediated in the setting of rapid atrial fibrillation.   Echo 04/2016 EF 15% but improved to 40-45% on TEE in 7/18 and 55-60% by 8/19 echo.  Echo in 12/21 showed that EF remains 55% despite being back in atrial fibrillation.  Rate is reasonably controlled. He is not volume overloaded on exam, NYHA class II symptoms (stable). - Continue Entresto 97/103 bid.     - Continue spironolactone 25 mg daily.  - Continue Toprol XL 100 mg bid. - Will hold off on Farxiga initiation given current urological issues  - Check BMP today  - discussed daily wts, low sodium diet and fluid restriction  2. Atrial fibrillation: Persistent atrial fibrillation.  Was thought to have tachy-mediated CMP in past, but EF 55% on 12/21 echo in setting of good rate control (HR in 70s).  We have decided not to cardiovert him again as he broke through amiodarone and is not particularly compliant.  He is now off amiodarone.  - Continue apixaban 5 mg bid.  Check CBC today  3. HLD: Continue statin - Check FLP and HFTs today  4. OSA: Needs appointment with sleep medicine to get back on CPAP. We discussed this again  today. He had an appt but canceled. He will  call to reschedule   5. Diabetes: Per primary, has neuropathy.  6. Hematuria: likely 2/2 problem # 7. See below, followed by urology  7. Bladder Mass: 2.5 x 2.0 cm polypoid lesion noted posterior rt bladder wall on CT 8/22. Further w/u per urology 8. Pulmonary Nodule: incidental finding on CT 8/22. Will need surveillance f/u. Will route to PCP   F/u w/ Dr. [name] in 6 months   [place], [place]  10/23/2020

## 2020-10-23 ENCOUNTER — Ambulatory Visit (HOSPITAL_COMMUNITY)
Admission: RE | Admit: 2020-10-23 | Discharge: 2020-10-23 | Disposition: A | Discharge status: Home / Self Care | Payer: Medicare Other | Source: Ambulatory Visit | Attending: Cardiology | Admitting: Cardiology

## 2020-10-23 ENCOUNTER — Encounter (HOSPITAL_COMMUNITY): Payer: Self-pay

## 2020-10-23 ENCOUNTER — Other Ambulatory Visit: Payer: Self-pay

## 2020-10-23 VITALS — BP 132/78 | HR 93 | Wt 305.0 lb

## 2020-10-23 DIAGNOSIS — E1140 Type 2 diabetes mellitus with diabetic neuropathy, unspecified: Secondary | ICD-10-CM | POA: Diagnosis not present

## 2020-10-23 DIAGNOSIS — Z8249 Family history of ischemic heart disease and other diseases of the circulatory system: Secondary | ICD-10-CM | POA: Diagnosis not present

## 2020-10-23 DIAGNOSIS — Z833 Family history of diabetes mellitus: Secondary | ICD-10-CM | POA: Insufficient documentation

## 2020-10-23 DIAGNOSIS — I428 Other cardiomyopathies: Secondary | ICD-10-CM | POA: Diagnosis not present

## 2020-10-23 DIAGNOSIS — E785 Hyperlipidemia, unspecified: Secondary | ICD-10-CM | POA: Insufficient documentation

## 2020-10-23 DIAGNOSIS — I5032 Chronic diastolic (congestive) heart failure: Secondary | ICD-10-CM | POA: Diagnosis not present

## 2020-10-23 DIAGNOSIS — R911 Solitary pulmonary nodule: Secondary | ICD-10-CM | POA: Diagnosis not present

## 2020-10-23 DIAGNOSIS — I4819 Other persistent atrial fibrillation: Secondary | ICD-10-CM | POA: Diagnosis not present

## 2020-10-23 DIAGNOSIS — I509 Heart failure, unspecified: Secondary | ICD-10-CM | POA: Diagnosis not present

## 2020-10-23 DIAGNOSIS — I5042 Chronic combined systolic (congestive) and diastolic (congestive) heart failure: Secondary | ICD-10-CM | POA: Insufficient documentation

## 2020-10-23 DIAGNOSIS — G4733 Obstructive sleep apnea (adult) (pediatric): Secondary | ICD-10-CM | POA: Diagnosis not present

## 2020-10-23 DIAGNOSIS — Z7901 Long term (current) use of anticoagulants: Secondary | ICD-10-CM | POA: Insufficient documentation

## 2020-10-23 DIAGNOSIS — Z7984 Long term (current) use of oral hypoglycemic drugs: Secondary | ICD-10-CM | POA: Diagnosis not present

## 2020-10-23 DIAGNOSIS — I2510 Atherosclerotic heart disease of native coronary artery without angina pectoris: Secondary | ICD-10-CM | POA: Insufficient documentation

## 2020-10-23 DIAGNOSIS — R319 Hematuria, unspecified: Secondary | ICD-10-CM | POA: Diagnosis not present

## 2020-10-23 DIAGNOSIS — Z79899 Other long term (current) drug therapy: Secondary | ICD-10-CM | POA: Diagnosis not present

## 2020-10-23 LAB — COMPREHENSIVE METABOLIC PANEL
ALT: 20 U/L (ref 0–44)
AST: 17 U/L (ref 15–41)
Albumin: 3.3 g/dL — ABNORMAL LOW (ref 3.5–5.0)
Alkaline Phosphatase: 67 U/L (ref 38–126)
Anion gap: 11 (ref 5–15)
BUN: 19 mg/dL (ref 8–23)
CO2: 24 mmol/L (ref 22–32)
Calcium: 9.3 mg/dL (ref 8.9–10.3)
Chloride: 99 mmol/L (ref 98–111)
Creatinine, Ser: 1.22 mg/dL (ref 0.61–1.24)
GFR, Estimated: >60 mL/min (ref >60)
Glucose, Bld: 178 mg/dL — ABNORMAL HIGH (ref 70–99)
Potassium: 4.4 mmol/L (ref 3.5–5.1)
Sodium: 134 mmol/L — ABNORMAL LOW (ref 135–145)
Total Bilirubin: 1 mg/dL (ref 0.3–1.2)
Total Protein: 6.6 g/dL (ref 6.5–8.1)

## 2020-10-23 LAB — CBC
HCT: 46.6 % (ref 39.0–52.0)
Hemoglobin: 15.9 g/dL (ref 13.0–17.0)
MCH: 30.3 pg (ref 26.0–34.0)
MCHC: 34.1 g/dL (ref 30.0–36.0)
MCV: 88.9 fL (ref 80.0–100.0)
Platelets: 192 10*3/uL (ref 150–400)
RBC: 5.24 MIL/uL (ref 4.22–5.81)
RDW: 13.3 % (ref 11.5–15.5)
WBC: 7.7 10*3/uL (ref 4.0–10.5)
nRBC: 0 % (ref 0.0–0.2)

## 2020-10-23 LAB — LIPID PANEL
Cholesterol: 195 mg/dL (ref 0–200)
HDL: 50 mg/dL (ref >40)
LDL Cholesterol: 117 mg/dL — ABNORMAL HIGH (ref 0–99)
Total CHOL/HDL Ratio: 3.9 RATIO
Triglycerides: 141 mg/dL (ref <150)
VLDL: 28 mg/dL (ref 0–40)

## 2020-10-23 NOTE — Patient Instructions (Signed)
It was great to see you today! No medication changes are needed at this time.  Labs today We will only contact you if something comes back abnormal or we need to make some changes. Otherwise no news is good news!  Your physician recommends that you schedule a follow-up appointment in: [facility]  Do the following things EVERYDAY: Weigh yourself in the morning before breakfast. Write it down and keep it in a log. Take your medicines as prescribed Eat low salt foods--Limit salt (sodium) to 2000 mg per day.  Stay as active as you can everyday Limit all fluids for the day to less than 2 liters  At the [facility], you and your health needs are our priority. As part of our continuing mission to provide you with exceptional heart care, we have created designated Provider Care Teams. These Care Teams include your primary Cardiologist (physician) and Advanced Practice Providers (APPs- Physician Assistants and Nurse Practitioners) who all work together to provide you with the care you need, when you need it.   You may see any of the following providers on your designated Care Team at your next follow up: Dr Glori Bickers Dr Loralie Champagne Dr Patrice Paradise, NP Lyda Jester, [place] Ginnie Smart Audry Riles, PharmD   Please be sure to bring in all your medications bottles to every appointment.

## 2020-10-24 ENCOUNTER — Other Ambulatory Visit (HOSPITAL_COMMUNITY): Payer: Self-pay

## 2020-10-24 ENCOUNTER — Telehealth (HOSPITAL_COMMUNITY): Payer: Self-pay

## 2020-10-24 MED ORDER — EZETIMIBE 10 MG PO TABS
10.0000 mg | ORAL_TABLET | Freq: Every day | ORAL | 3 refills | Status: DC
  Filled 2020-10-24: qty 90, 90d supply, fill #0
  Filled 2021-01-15: qty 90, 90d supply, fill #1
  Filled 2021-05-01: qty 90, 90d supply, fill #2

## 2020-10-24 NOTE — Telephone Encounter (Signed)
-----   Message from Consuelo Pandy, [place] sent at 10/23/2020  4:58 PM EDT ----- LDL (bad cholesterol) is high. Continue atorvastatin 80. Add zeita 10. Repeat FLP and HFTs in 8 weeks.

## 2020-10-24 NOTE — Telephone Encounter (Signed)
Pt aware of results and recommendations.  Will start Zetia '10mg'$  (1 tab) daily.  Appt made for 2 month lab f/u.  Pt verbalized understanding.  Reminder card mailed.

## 2020-10-29 ENCOUNTER — Other Ambulatory Visit: Payer: Self-pay

## 2020-10-29 ENCOUNTER — Ambulatory Visit (INDEPENDENT_AMBULATORY_CARE_PROVIDER_SITE_OTHER): Payer: Medicare Other | Admitting: Urology

## 2020-10-29 VITALS — BP 150/87 | HR 87

## 2020-10-29 DIAGNOSIS — R310 Gross hematuria: Secondary | ICD-10-CM

## 2020-10-29 DIAGNOSIS — D414 Neoplasm of uncertain behavior of bladder: Secondary | ICD-10-CM

## 2020-10-29 DIAGNOSIS — N3289 Other specified disorders of bladder: Secondary | ICD-10-CM

## 2020-10-29 HISTORY — DX: Other specified disorders of bladder: N32.89

## 2020-10-29 LAB — MICROSCOPIC EXAMINATION
Epithelial Cells (non renal): >10 /hpf — AB (ref 0–10)
Renal Epithel, UA: >10 /hpf — AB

## 2020-10-29 LAB — URINALYSIS, ROUTINE W REFLEX MICROSCOPIC
Bilirubin, UA: NEGATIVE
Ketones, UA: NEGATIVE
Nitrite, UA: NEGATIVE
Specific Gravity, UA: >1.03 — ABNORMAL HIGH (ref 1.005–1.030)
Urobilinogen, Ur: 0.2 mg/dL (ref 0.2–1.0)
pH, UA: 5.5 (ref 5.0–7.5)

## 2020-10-29 MED ORDER — CIPROFLOXACIN HCL 500 MG PO TABS
500.0000 mg | ORAL_TABLET | Freq: Once | ORAL | Status: AC
  Administered 2020-10-29: 500 mg via ORAL

## 2020-10-29 NOTE — Progress Notes (Signed)
   10/29/20  CC: No chief complaint on file.   HPI:  1) bladder mass - pt presented with gross hematuria for a couple of months Aug 2022. Creatinine 1.1-1.5 range. Office UA was clear. CT abd and pelvis 08/22 with a 2.5 cm right posterior bladder mass. No LAD or bone lesions.    AUASS = 14. Urgency and nocturia. Of note, he has a sleep study pending from pulmonology.     He has a history of A. fib and an atrial thrombus and is on Eliquis. 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    Today, seen for the above. No further gross hematuria.   There were no vitals taken for this visit. NED. A&Ox3.   No respiratory distress   Abd soft, NT, ND Normal phallus with bilateral descended testicles  Cystoscopy Procedure Note  Patient identification was confirmed, informed consent was obtained, and patient was prepped using Betadine solution.  Lidocaine jelly was administered per urethral meatus.     Pre-Procedure: - Inspection reveals a normal caliber ureteral meatus.  Procedure: The flexible cystoscope was introduced without difficulty - No urethral strictures/lesions are present. 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 Urine sent for cx and abx given.  - No bladder stones - No trabeculation    Post-Procedure: - Patient tolerated the procedure well  Assessment/ Plan:  Bladder neoplasm -discussed with the patient and his friend the nature risk benefits and alternatives to TURBT and postoperative instillation of gemcitabine.  We discussed he will need surveillance cystoscopies in the future, possibly repeat staging. Discussed he may need a foley or a stent. Cardiology clearance requested. He wanted Rosann Auerbach in the room to discuss all of this and help him get it scheduled.   No follow-ups  on file.  Festus Aloe, MD

## 2020-10-29 NOTE — Progress Notes (Signed)
Urological Symptom Review  Patient is experiencing the following symptoms: Frequent urination Hard to postpone urination Burning/pain with urination Get up at night to urinate Leakage of urine Blood in urine   Review of Systems  Gastrointestinal (upper)  : Negative for upper GI symptoms  Gastrointestinal (lower) : Negative for lower GI symptoms  Constitutional : Negative for symptoms  Skin: Negative for skin symptoms  Eyes: Negative for eye symptoms  Ear/Nose/Throat : Negative for Ear/Nose/Throat symptoms  Hematologic/Lymphatic: Negative for Hematologic/Lymphatic symptoms  Cardiovascular : Negative for cardiovascular symptoms  Respiratory : Negative for respiratory symptoms  Endocrine: Negative for endocrine symptoms  Musculoskeletal: Negative for musculoskeletal symptoms  Neurological: Negative for neurological symptoms  Psychologic: Negative for psychiatric symptoms

## 2020-10-31 LAB — URINE CULTURE

## 2020-11-15 ENCOUNTER — Other Ambulatory Visit: Payer: Self-pay | Admitting: Urology

## 2020-12-05 ENCOUNTER — Other Ambulatory Visit (HOSPITAL_COMMUNITY): Payer: Self-pay

## 2020-12-11 ENCOUNTER — Other Ambulatory Visit (HOSPITAL_COMMUNITY): Payer: Self-pay

## 2020-12-11 ENCOUNTER — Encounter: Payer: Self-pay | Admitting: Cardiology

## 2020-12-11 ENCOUNTER — Ambulatory Visit (INDEPENDENT_AMBULATORY_CARE_PROVIDER_SITE_OTHER): Payer: Medicare Other | Admitting: Cardiology

## 2020-12-11 ENCOUNTER — Other Ambulatory Visit: Payer: Self-pay

## 2020-12-11 ENCOUNTER — Encounter (HOSPITAL_BASED_OUTPATIENT_CLINIC_OR_DEPARTMENT_OTHER): Payer: Self-pay | Admitting: Urology

## 2020-12-11 VITALS — BP 158/82 | HR 109 | Ht 74.0 in | Wt 304.0 lb

## 2020-12-11 DIAGNOSIS — I4811 Longstanding persistent atrial fibrillation: Secondary | ICD-10-CM | POA: Diagnosis not present

## 2020-12-11 DIAGNOSIS — C679 Malignant neoplasm of bladder, unspecified: Secondary | ICD-10-CM

## 2020-12-11 HISTORY — DX: Malignant neoplasm of bladder, unspecified: C67.9

## 2020-12-11 MED ORDER — METOPROLOL SUCCINATE ER 200 MG PO TB24
200.0000 mg | ORAL_TABLET | Freq: Every day | ORAL | 1 refills | Status: DC
  Filled 2020-12-11: qty 90, 90d supply, fill #0
  Filled 2021-05-01: qty 90, 90d supply, fill #1

## 2020-12-11 NOTE — Progress Notes (Addendum)
Spoke w/ via phone for pre-op interview---patient Lab needs dos---- ISTAT              Lab results------07/23/20 EKG in [place] test -----patient states asymptomatic no test needed Arrive at -------1200 pm on 12/14/2020 NPO after MN NO Solid Food.  Clear liquids from MN until---1100 Med rec completed Medications to take morning of surgery -----Metoprolol, Zetia Diabetic medication -----Hold metformin morning of surgery. Patient instructed no nail polish to be worn day of surgery Patient instructed to bring photo id and insurance card day of surgery Patient aware to have Driver (ride ) / caregiver    for 24 hours after surgery Rosann Auerbach will bring patient. Arbie Cookey will pick up patient after surgery. Patient Special Instructions -----Bring CPAP (if patient can find), call Dr. [name] cardiologist for instructions on taking Eliquis the week of surgery. Pre-Op special Istructions -----Requested orders from Dr. Junious Silk for patient taking Eliquis on 12/10/20. Awaiting cardiac clearance (Dr. Junious Silk requested cardiac clearance per 10/29/20 OV note) (Left message with [facility], Dr. Lyndal Rainbow scheduler on 10/31 & 12/11/20) Patient verbalized understanding of instructions that were given at this phone interview. Patient denies shortness of breath, chest pain, fever, cough at this phone interview.   Cardiology LOV with Dr. [name] on 07/23/2020. Cardiology LOV with Dr. Curt Bears (electrophysiology) 12/11/20.

## 2020-12-11 NOTE — Patient Instructions (Addendum)
Medication Instructions:  Your physician has recommended you make the following change in your medication:  INCREASE Metoprolol Succinate (Toprol) to 200 mg once daily   *If you need a refill on your cardiac medications before your next appointment, please call your pharmacy*   Lab Work: None ordered   Testing/Procedures: None ordered   Follow-Up: At [facility], you and your health needs are our priority.  As part of our continuing mission to provide you with exceptional heart care, we have created designated Provider Care Teams.  These Care Teams include your primary Cardiologist (physician) and Advanced Practice Providers (APPs -  Physician Assistants and Nurse Practitioners) who all work together to provide you with the care you need, when you need it.   Your next appointment:   as  needed  The format for your next appointment:   In Person  Provider:   Allegra Lai, MD   Keep your routine follow up with the [facility]   Thank you for choosing [facility]!!   Trinidad Curet, RN [phone]

## 2020-12-11 NOTE — Addendum Note (Signed)
Addended by: Stanton Kidney on: 12/11/2020 10:07 AM   Modules accepted: Orders

## 2020-12-11 NOTE — Progress Notes (Signed)
Electrophysiology Office Note   Date:  12/11/2020   ID:  Darius [name], DOB 1956/01/06, MRN [id number]  PCP:  Vevelyn Francois, NP  Cardiologist:  [name] Primary Electrophysiologist:  Constance Haw, MD    No chief complaint on file.    History of Present Illness: Darius [name] is a 65 y.o. male who is being seen today for the evaluation of atrial fibrillation at the request of Darius [name]. Presenting today for electrophysiology evaluation.    He has a history similar for diabetes, atrial fibrillation, OSA, chronic systolic heart failure due to nonischemic cardiomyopathy that was thought tachycardia mediated.  In 2018 he was admitted to hospital for acute heart failure and rapid atrial fibrillation.  Right and left heart catheterization showed no coronary artery disease but elevated filling pressures.  His ejection fraction was 15%.  He was found to have a left atrial appendage thrombus and was started on Eliquis.  Repeat echo after rhythm control showed an ejection fraction of 40 to 45%.  It is since normalized.  Today, denies symptoms of palpitations, chest pain, shortness of breath, orthopnea, PND, lower extremity edema, claudication, dizziness, presyncope, syncope, bleeding, or neurologic sequela. The patient is tolerating medications without difficulties.  Since being seen he has done well.  He has no cardiac complaints today.  He is unaware of his atrial fibrillation.  He does not have weakness, fatigue, or shortness of breath.  His main issue is hematuria.  He does have follow-up with urology.  Past Medical History:  Diagnosis Date   Atrial fibrillation ([place])    unable to tolerate versed and fentanyl sedation and required gen anesthesia for TEE;  s/p TEE-DCCV (failed);  Amiodarone started   CHF (congestive heart failure) (HCC)    Chronic diastolic heart failure ([place])    a. Echo 03/27/11: EF 50-55%, inferior hypokinesis, moderate LAE, mild RVE, normal pulmonary pressures. ;    b.  TEE (04/03/11): EF 40-45%, mild MR moderate LAE, no LAA clot, mild RAE;  c.  Echocardiogram (02/2013): EF 60-65%, Gr 2 DD, mildly dilated Ao root (Ao root dimension 39 mm), MAC, mild LAE, normal RVF   Diabetes mellitus, type 2 ([place])    Hx of cardiovascular stress test    Lexiscan Myoview (02/2013): No ischemia or scar, EF 51%, low risk   Morbid obesity (HCC)    Sleep apnea    a. sleep test 01/2013:  very severy OSA   Past Surgical History:  Procedure Laterality Date   CARDIOVERSION  04/01/2011   Procedure: CARDIOVERSION;  Surgeon: Lelon Perla, MD;  Location: [place];  Service: Cardiovascular;  Laterality: N/A;   CARDIOVERSION  04/03/2011   Procedure: CARDIOVERSION;  Surgeon: Lelon Perla, MD;  Location: [facility] ENDOSCOPY;  Service: Cardiovascular;  Laterality: N/A;   CARDIOVERSION  04/03/2011   Procedure: CARDIOVERSION;  Surgeon: Lelon Perla, MD;  Location: [place];  Service: Cardiovascular;  Laterality: N/A;   CARDIOVERSION N/A 06/15/2014   Procedure: CARDIOVERSION;  Surgeon: [name], MD;  Location: [facility] ENDOSCOPY;  Service: Cardiovascular;  Laterality: N/A;   CARDIOVERSION N/A 09/08/2016   Procedure: CARDIOVERSION;  Surgeon: [name], MD;  Location: [facility] ENDOSCOPY;  Service: Cardiovascular;  Laterality: N/A;   I & D EXTREMITY Right 09/19/2015   Procedure: IRRIGATION AND DEBRIDEMENT EXTREMITY;  Surgeon: Mcarthur Rossetti, MD;  Location: [place];  Service: Orthopedics;  Laterality: Right;   RIGHT/LEFT HEART CATH AND CORONARY ANGIOGRAPHY N/A 04/21/2016   Procedure: Right/Left Heart Cath and Coronary  Angiography;  Surgeon: [name], MD;  Location: [place] CV LAB;  Service: Cardiovascular;  Laterality: N/A;   TEE WITHOUT CARDIOVERSION  04/01/2011   Procedure: TRANSESOPHAGEAL ECHOCARDIOGRAM (TEE);  Surgeon: Lelon Perla, MD;  Location: [facility] ENDOSCOPY;  Service: Cardiovascular;  Laterality: N/A;   TEE WITHOUT CARDIOVERSION  04/03/2011   Procedure: TRANSESOPHAGEAL  ECHOCARDIOGRAM (TEE);  Surgeon: Lelon Perla, MD;  Location: [facility] ENDOSCOPY;  Service: Cardiovascular;  Laterality: N/A;   TEE WITHOUT CARDIOVERSION  04/03/2011   Procedure: TRANSESOPHAGEAL ECHOCARDIOGRAM (TEE);  Surgeon: Lelon Perla, MD;  Location: [place];  Service: Cardiovascular;  Laterality: N/A;   TEE WITHOUT CARDIOVERSION N/A 04/24/2016   Procedure: TRANSESOPHAGEAL ECHOCARDIOGRAM (TEE);  Surgeon: [name], MD;  Location: [place];  Service: Cardiovascular;  Laterality: N/A;   TEE WITHOUT CARDIOVERSION N/A 05/26/2016   Procedure: TRANSESOPHAGEAL ECHOCARDIOGRAM (TEE);  Surgeon: [name], MD;  Location: [place];  Service: Cardiovascular;  Laterality: N/A;   TEE WITHOUT CARDIOVERSION N/A 09/08/2016   Procedure: TRANSESOPHAGEAL ECHOCARDIOGRAM (TEE);  Surgeon: [name], MD;  Location: [facility] ENDOSCOPY;  Service: Cardiovascular;  Laterality: N/A;     Current Outpatient Medications  Medication Sig Dispense Refill   apixaban (ELIQUIS) 5 MG TABS tablet TAKE 1 TABLET (5 MG TOTAL) BY MOUTH 2 (TWO) TIMES DAILY. 60 tablet 0   ezetimibe (ZETIA) 10 MG tablet Take 1 tablet (10 mg total) by mouth daily. 90 tablet 3   metFORMIN (GLUCOPHAGE) 1000 MG tablet Take 1 tablet (1,000 mg total) by mouth 2 (two) times daily with food. 60 tablet 1   metoprolol succinate (TOPROL-XL) 100 MG 24 hr tablet TAKE 1 TABLET BY MOUTH TWICE DAILY WITH OR IMMEDIATELY FOLLOWING A MEAL. 60 tablet 3   sacubitril-valsartan (ENTRESTO) 97-103 MG Take 1 tablet by mouth 2 (two) times daily. 60 tablet 3   spironolactone (ALDACTONE) 25 MG tablet Take 1 tablet (25 mg total) by mouth daily. 40 tablet 0   atorvastatin (LIPITOR) 80 MG tablet TAKE 1 TABLET BY MOUTH AT BEDTIME. 30 tablet 6   No current facility-administered medications for this visit.    Allergies:   Patient has no known allergies.   Social History:  The patient  reports that he has never smoked. He has never used smokeless tobacco. He reports  current alcohol use. He reports that he does not use drugs.   Family History:  The patient's family history includes Diabetes in his father; Hypertension in his father, paternal grandfather, and sister; Seizures in his mother.   ROS:  Please see the history of present illness.   Otherwise, review of systems is positive for none.   All other systems are reviewed and negative.   PHYSICAL EXAM: VS:  BP (!) 158/82   Pulse (!) 109   Ht _0  (1.88 m)   Wt (!) 304 lb (137.9 kg)   SpO2 96%   BMI 39.03 kg/m  , BMI Body mass index is 39.03 kg/m. GEN: Well nourished, well developed, in no acute distress  HEENT: normal  Neck: no JVD, carotid bruits, or masses Cardiac: irregular; no murmurs, rubs, or gallops,no edema  Respiratory:  clear to auscultation bilaterally, normal work of breathing GI: soft, nontender, nondistended, + BS MS: no deformity or atrophy  Skin: warm and dry Neuro:  Strength and sensation are intact Psych: euthymic mood, full affect  EKG:  EKG is ordered today. Personal review of the ekg ordered shows atrial fibrillation, rate 109  Recent Labs: 10/23/2020: ALT 20;  BUN 19; Creatinine, Ser 1.22; Hemoglobin 15.9; Platelets 192; Potassium 4.4; Sodium 134    Lipid Panel     Component Value Date/Time   CHOL 195 10/23/2020 1156   CHOL 140 10/20/2019 1006   TRIG 141 10/23/2020 1156   HDL 50 10/23/2020 1156   HDL 53 10/20/2019 1006   CHOLHDL 3.9 10/23/2020 1156   VLDL 28 10/23/2020 1156   LDLCALC 117 (H) 10/23/2020 1156   LDLCALC 65 10/20/2019 1006   LDLCALC 42 12/12/2016 0928     Wt Readings from Last 3 Encounters:  12/11/20 (!) 304 lb (137.9 kg)  10/23/20 (!) 305 lb (138.3 kg)  07/30/20 (!) 305 lb (138.3 kg)      Other studies Reviewed: Additional studies/ records that were reviewed today include: TTE 09/11/17  Review of the above records today demonstrates:  - Left ventricle: The cavity size was normal. Wall thickness was   increased in a pattern of mild LVH.  Systolic function was normal.   The estimated ejection fraction was in the range of 55% to 60%.   Although no diagnostic regional wall motion abnormality was   identified, this possibility cannot be completely excluded on the   basis of this study. GLS -19.1%. - Aortic valve: There was no stenosis. - Mitral valve: There was no significant regurgitation. - Right ventricle: The cavity size was normal. Systolic function   was normal. - Pulmonary arteries: No complete TR doppler jet so unable to   estimate PA systolic pressure. - Inferior vena cava: The vessel was normal in size. The   respirophasic diameter changes were in the normal range (>= 50%),   consistent with normal central venous pressure.   ASSESSMENT AND PLAN:  1.  Persistent atrial fibrillation: Currently on Toprol-XL 100 mg daily, Eliquis 5 mg daily.  CHA2DS2-VASc of 2.  His heart rates are fast today.  I do think that his atrial fibrillation has likely become permanent.  He does not want to try to get back into normal rhythm as he is feeling well.  We Kordae Buonocore increase Toprol-XL to 200 mg.  I Umaima Scholten let him continue to follow-up with heart failure cardiology.  I Kiaya Haliburton see him back as needed.  2.  Chronic systolic heart failure: Currently on Entresto, Aldactone, Toprol-XL.  Ejection fraction has normalized.  Continue per primary cardiology.  3.  Obstructive sleep apnea: CPAP compliance encouraged  4.  Hyperlipidemia: Continue statin  Current medicines are reviewed at length with the patient today.   The patient does not have concerns regarding his medicines.  The following changes were made today: Increase Toprol-XL  Labs/ tests ordered today include:  Orders Placed This Encounter  Procedures   EKG 12-Lead     Disposition:   FU with Brennin Durfee as needed months  Signed, Justinn Welter Meredith Leeds, MD  12/11/2020 9:48 AM     [place] [address] [id number] [phone]  (office) [phone] (fax)

## 2020-12-13 ENCOUNTER — Telehealth: Payer: Self-pay | Admitting: Cardiology

## 2020-12-13 DIAGNOSIS — C672 Malignant neoplasm of lateral wall of bladder: Secondary | ICD-10-CM

## 2020-12-13 DIAGNOSIS — D414 Neoplasm of uncertain behavior of bladder: Secondary | ICD-10-CM

## 2020-12-13 NOTE — Telephone Encounter (Signed)
   [facility] Pre-operative Risk Assessment    Request for surgical clearance:  What type of surgery is being performed?  Transurethral Resection of Bladder Tumor    When is this surgery scheduled? 12/14/20   What type of clearance is required (medical clearance vs. Pharmacy clearance to hold med vs. Both)?  Both   Are there any medications that need to be held prior to surgery and how long? Apixaban 5 MG, 2-3 days prior. Per Marlowe Kays, patient has already been holding this medication in preparation for his procedure   Practice name and name of physician performing surgery?  [facility]  Dr. Festus Aloe   What is your office phone number? [phone] (ext: [phone])   7.   What is your office fax number? [phone]  8.   Anesthesia type (None, local, MAC, general) ?  General    Zara Council 12/13/2020, 8:47 AM

## 2020-12-13 NOTE — Telephone Encounter (Signed)
Patient with diagnosis of afib on Eliquis for anticoagulation.    Procedure: Transurethral Resection of Bladder Tumor   Date of procedure: 12/14/20   CHA2DS2-VASc Score = 3   This indicates a 3.2% annual risk of stroke. The patient's score is based upon: CHF History: 1 HTN History: 0 Diabetes History: 1 Stroke History: 0 Vascular Disease History: 0 Age Score: 1 Gender Score: 0     CrCl 88 ml/min  Per office protocol, patient can hold Eliquis for 2-3 days prior to procedure.    With patient's last dose yesterday AM it will have been a little over 48 hours since last dose at the time of his procedure since his procedure is tomorrow afternoon.

## 2020-12-13 NOTE — Progress Notes (Signed)
Cardiac clearance note angela duke pa 12-13-2020 on chart /epic for 12-13-2020 surgery. Note to stop eliquis  2-3 days before 12-14-2020 Darius [name] rph dated 12-3020 on chart/epic.pt stopped eliquis 11-20-202 in am per angela duke pa 12-13-2020 note.

## 2020-12-13 NOTE — Telephone Encounter (Addendum)
   Name: Darius [name]  DOB: March 28, 1955  MRN: [id number]   Primary Cardiologist: Loralie Champagne, MD  Chart reviewed as part of pre-operative protocol coverage. Patient was contacted 12/13/2020 in reference to pre-operative risk assessment for pending surgery as outlined below.  Darius [name] was last seen on 12/11/20 by Dr. Curt Bears.  Since that day, Darius [name] has done well. He has normal coronaries by heart cath in 2018. NICM felt to be related to tachycardia. Afib is now permanent. He can complete more than 4.0 METS without angina.   Pt has already held eliquis, last dose was yesterday morning.   Per our clinical pharmacist: Per office protocol, patient can hold Eliquis for 2-3 days prior to procedure.     With patient's last dose yesterday AM it will have been a little over 48 hours since last dose at the time of his procedure since his procedure is tomorrow afternoon.  Therefore, based on ACC/AHA guidelines, the patient would be at acceptable risk for the planned procedure without further cardiovascular testing.   The patient was advised that if he develops new symptoms prior to surgery to contact our office to arrange for a follow-up visit, and he verbalized understanding.  I will route this recommendation to the requesting party via Epic fax function and remove from pre-op pool. Please call with questions.  Tami Lin Waverly Chavarria, PA 12/13/2020, 10:39 AM

## 2020-12-14 ENCOUNTER — Other Ambulatory Visit: Payer: Self-pay

## 2020-12-14 ENCOUNTER — Encounter (HOSPITAL_BASED_OUTPATIENT_CLINIC_OR_DEPARTMENT_OTHER): Payer: Self-pay | Admitting: Urology

## 2020-12-14 ENCOUNTER — Observation Stay (HOSPITAL_BASED_OUTPATIENT_CLINIC_OR_DEPARTMENT_OTHER)
Admission: RE | Admit: 2020-12-14 | Discharge: 2020-12-15 | Disposition: A | Discharge status: Home / Self Care | Payer: Medicare Other | Attending: Urology | Admitting: Urology

## 2020-12-14 ENCOUNTER — Encounter (HOSPITAL_BASED_OUTPATIENT_CLINIC_OR_DEPARTMENT_OTHER)
Admission: RE | Disposition: A | Discharge status: Home / Self Care | Payer: Self-pay | Source: Home / Self Care | Attending: Urology

## 2020-12-14 ENCOUNTER — Ambulatory Visit (HOSPITAL_BASED_OUTPATIENT_CLINIC_OR_DEPARTMENT_OTHER): Payer: Medicare Other | Admitting: Anesthesiology

## 2020-12-14 DIAGNOSIS — I4891 Unspecified atrial fibrillation: Secondary | ICD-10-CM | POA: Insufficient documentation

## 2020-12-14 DIAGNOSIS — Z7901 Long term (current) use of anticoagulants: Secondary | ICD-10-CM | POA: Diagnosis not present

## 2020-12-14 DIAGNOSIS — I5032 Chronic diastolic (congestive) heart failure: Secondary | ICD-10-CM | POA: Insufficient documentation

## 2020-12-14 DIAGNOSIS — Z79899 Other long term (current) drug therapy: Secondary | ICD-10-CM | POA: Diagnosis not present

## 2020-12-14 DIAGNOSIS — D303 Benign neoplasm of bladder: Principal | ICD-10-CM | POA: Insufficient documentation

## 2020-12-14 DIAGNOSIS — E119 Type 2 diabetes mellitus without complications: Secondary | ICD-10-CM | POA: Diagnosis not present

## 2020-12-14 DIAGNOSIS — D414 Neoplasm of uncertain behavior of bladder: Secondary | ICD-10-CM

## 2020-12-14 DIAGNOSIS — C672 Malignant neoplasm of lateral wall of bladder: Secondary | ICD-10-CM

## 2020-12-14 HISTORY — PX: TRANSURETHRAL RESECTION OF BLADDER TUMOR: SHX2575

## 2020-12-14 HISTORY — DX: Depression, unspecified: F32.A

## 2020-12-14 LAB — GLUCOSE, CAPILLARY
Glucose-Capillary: 185 mg/dL — ABNORMAL HIGH (ref 70–99)
Glucose-Capillary: 201 mg/dL — ABNORMAL HIGH (ref 70–99)

## 2020-12-14 LAB — POCT I-STAT, CHEM 8
BUN: 19 mg/dL (ref 8–23)
Calcium, Ion: 1.18 mmol/L (ref 1.15–1.40)
Chloride: 102 mmol/L (ref 98–111)
Creatinine, Ser: 1.3 mg/dL — ABNORMAL HIGH (ref 0.61–1.24)
Glucose, Bld: 200 mg/dL — ABNORMAL HIGH (ref 70–99)
HCT: 52 % (ref 39.0–52.0)
Hemoglobin: 17.7 g/dL — ABNORMAL HIGH (ref 13.0–17.0)
Potassium: 4 mmol/L (ref 3.5–5.1)
Sodium: 138 mmol/L (ref 135–145)
TCO2: 22 mmol/L (ref 22–32)

## 2020-12-14 SURGERY — TURBT (TRANSURETHRAL RESECTION OF BLADDER TUMOR)
Anesthesia: General | Site: Bladder

## 2020-12-14 MED ORDER — DEXMEDETOMIDINE (PRECEDEX) IN NS 20 MCG/5ML (4 MCG/ML) IV SYRINGE
PREFILLED_SYRINGE | INTRAVENOUS | Status: DC | PRN
  Administered 2020-12-14: 20 ug via INTRAVENOUS

## 2020-12-14 MED ORDER — ACETAMINOPHEN 325 MG PO TABS
ORAL_TABLET | ORAL | Status: AC
  Filled 2020-12-14: qty 2

## 2020-12-14 MED ORDER — ATORVASTATIN CALCIUM 80 MG PO TABS
80.0000 mg | ORAL_TABLET | Freq: Every day | ORAL | Status: DC
  Administered 2020-12-14: 80 mg via ORAL
  Filled 2020-12-14: qty 1

## 2020-12-14 MED ORDER — METOPROLOL TARTRATE 5 MG/5ML IV SOLN
INTRAVENOUS | Status: AC
  Filled 2020-12-14: qty 10

## 2020-12-14 MED ORDER — FENTANYL CITRATE (PF) 250 MCG/5ML IJ SOLN
INTRAMUSCULAR | Status: DC | PRN
  Administered 2020-12-14 (×4): 50 ug via INTRAVENOUS

## 2020-12-14 MED ORDER — HYDROMORPHONE HCL 1 MG/ML IJ SOLN
INTRAMUSCULAR | Status: AC
  Filled 2020-12-14: qty 1

## 2020-12-14 MED ORDER — CEFAZOLIN SODIUM 1 G IJ SOLR
INTRAMUSCULAR | Status: AC
  Filled 2020-12-14: qty 10

## 2020-12-14 MED ORDER — METOPROLOL SUCCINATE ER 100 MG PO TB24
200.0000 mg | ORAL_TABLET | Freq: Every day | ORAL | Status: DC
  Filled 2020-12-14: qty 2

## 2020-12-14 MED ORDER — FENTANYL CITRATE (PF) 100 MCG/2ML IJ SOLN
INTRAMUSCULAR | Status: AC
  Filled 2020-12-14: qty 2

## 2020-12-14 MED ORDER — OXYCODONE HCL 5 MG PO TABS: 5.0000 mg | ORAL_TABLET | Freq: Once | ORAL | Status: DC | PRN

## 2020-12-14 MED ORDER — LIDOCAINE HCL URETHRAL/MUCOSAL 2 % EX GEL
CUTANEOUS | Status: DC | PRN
  Administered 2020-12-14: 1

## 2020-12-14 MED ORDER — APIXABAN 5 MG PO TABS: ORAL_TABLET | Freq: Two times a day (BID) | ORAL | 0 refills | Status: DC

## 2020-12-14 MED ORDER — PROPOFOL 10 MG/ML IV BOLUS
INTRAVENOUS | Status: DC | PRN
  Administered 2020-12-14: 200 mg via INTRAVENOUS

## 2020-12-14 MED ORDER — HYOSCYAMINE SULFATE 0.125 MG SL SUBL
SUBLINGUAL_TABLET | SUBLINGUAL | Status: AC
  Filled 2020-12-14: qty 1

## 2020-12-14 MED ORDER — METFORMIN HCL 500 MG PO TABS
1000.0000 mg | ORAL_TABLET | Freq: Two times a day (BID) | ORAL | Status: DC
  Administered 2020-12-14 – 2020-12-15 (×2): 1000 mg via ORAL
  Filled 2020-12-14 (×2): qty 2

## 2020-12-14 MED ORDER — CEFAZOLIN (ANCEF) 1 G IV SOLR
1.0000 g | INTRAVENOUS | Status: AC
  Administered 2020-12-14: 1 g
  Filled 2020-12-14: qty 1

## 2020-12-14 MED ORDER — MORPHINE SULFATE (PF) 4 MG/ML IV SOLN
2.0000 mg | Freq: Once | INTRAVENOUS | Status: AC
  Administered 2020-12-14: 2 mg via INTRAVENOUS

## 2020-12-14 MED ORDER — SUCCINYLCHOLINE CHLORIDE 200 MG/10ML IV SOSY
PREFILLED_SYRINGE | INTRAVENOUS | Status: AC
  Filled 2020-12-14: qty 10

## 2020-12-14 MED ORDER — CEFAZOLIN SODIUM-DEXTROSE 2-4 GM/100ML-% IV SOLN: 1.0000 g | Freq: Once | INTRAVENOUS | Status: DC

## 2020-12-14 MED ORDER — ROCURONIUM BROMIDE 10 MG/ML (PF) SYRINGE
PREFILLED_SYRINGE | INTRAVENOUS | Status: AC
  Filled 2020-12-14: qty 10

## 2020-12-14 MED ORDER — MIDAZOLAM HCL 2 MG/2ML IJ SOLN
INTRAMUSCULAR | Status: DC | PRN
Start: 2020-12-14 — End: 2020-12-14
  Administered 2020-12-14: 2 mg via INTRAVENOUS

## 2020-12-14 MED ORDER — EZETIMIBE 10 MG PO TABS
10.0000 mg | ORAL_TABLET | Freq: Every day | ORAL | Status: DC
  Filled 2020-12-14: qty 1

## 2020-12-14 MED ORDER — GEMCITABINE CHEMO FOR BLADDER INSTILLATION 2000 MG
2000.0000 mg | Freq: Once | INTRAVENOUS | Status: AC
  Administered 2020-12-14: 2000 mg via INTRAVESICAL
  Filled 2020-12-14: qty 2000

## 2020-12-14 MED ORDER — ACETAMINOPHEN 500 MG PO TABS
1000.0000 mg | ORAL_TABLET | Freq: Once | ORAL | Status: AC
  Administered 2020-12-14: 1000 mg via ORAL

## 2020-12-14 MED ORDER — BELLADONNA ALKALOIDS-OPIUM 16.2-60 MG RE SUPP
RECTAL | Status: AC
  Filled 2020-12-14: qty 1

## 2020-12-14 MED ORDER — METOPROLOL TARTRATE 5 MG/5ML IV SOLN
INTRAVENOUS | Status: DC | PRN
  Administered 2020-12-14: 3 mg via INTRAVENOUS
  Administered 2020-12-14: 2 mg via INTRAVENOUS

## 2020-12-14 MED ORDER — KETOROLAC TROMETHAMINE 30 MG/ML IJ SOLN: 30.0000 mg | Freq: Once | INTRAMUSCULAR | Status: DC | PRN

## 2020-12-14 MED ORDER — ONDANSETRON HCL 4 MG/2ML IJ SOLN
INTRAMUSCULAR | Status: DC | PRN
  Administered 2020-12-14: 4 mg via INTRAVENOUS

## 2020-12-14 MED ORDER — ONDANSETRON HCL 4 MG/2ML IJ SOLN: 4.0000 mg | INTRAMUSCULAR | Status: DC | PRN

## 2020-12-14 MED ORDER — DIPHENHYDRAMINE HCL 12.5 MG/5ML PO ELIX: 12.5000 mg | ORAL_SOLUTION | Freq: Four times a day (QID) | ORAL | Status: DC | PRN

## 2020-12-14 MED ORDER — CEFAZOLIN SODIUM-DEXTROSE 2-4 GM/100ML-% IV SOLN
INTRAVENOUS | Status: AC
  Filled 2020-12-14: qty 100

## 2020-12-14 MED ORDER — OXYCODONE HCL 5 MG/5ML PO SOLN: 5.0000 mg | Freq: Once | ORAL | Status: DC | PRN

## 2020-12-14 MED ORDER — ACETAMINOPHEN 325 MG PO TABS
650.0000 mg | ORAL_TABLET | ORAL | Status: DC | PRN
  Administered 2020-12-14 – 2020-12-15 (×4): 650 mg via ORAL

## 2020-12-14 MED ORDER — LIDOCAINE 2% (20 MG/ML) 5 ML SYRINGE
INTRAMUSCULAR | Status: AC
  Filled 2020-12-14: qty 5

## 2020-12-14 MED ORDER — SUGAMMADEX SODIUM 500 MG/5ML IV SOLN
INTRAVENOUS | Status: DC | PRN
  Administered 2020-12-14: 200 mg via INTRAVENOUS

## 2020-12-14 MED ORDER — ROCURONIUM BROMIDE 10 MG/ML (PF) SYRINGE
PREFILLED_SYRINGE | INTRAVENOUS | Status: DC | PRN
  Administered 2020-12-14: 50 mg via INTRAVENOUS

## 2020-12-14 MED ORDER — OXYCODONE HCL 5 MG PO TABS
5.0000 mg | ORAL_TABLET | ORAL | Status: DC | PRN
  Administered 2020-12-14 – 2020-12-15 (×4): 5 mg via ORAL

## 2020-12-14 MED ORDER — ACETAMINOPHEN 500 MG PO TABS
ORAL_TABLET | ORAL | Status: AC
  Filled 2020-12-14: qty 2

## 2020-12-14 MED ORDER — HYOSCYAMINE SULFATE 0.125 MG SL SUBL
0.1250 mg | SUBLINGUAL_TABLET | SUBLINGUAL | Status: DC | PRN
  Administered 2020-12-14 – 2020-12-15 (×4): 0.125 mg via SUBLINGUAL

## 2020-12-14 MED ORDER — MIDAZOLAM HCL 2 MG/2ML IJ SOLN
INTRAMUSCULAR | Status: AC
  Filled 2020-12-14: qty 2

## 2020-12-14 MED ORDER — SODIUM CHLORIDE 0.9 % IR SOLN
Status: DC | PRN
  Administered 2020-12-14 (×3): 3000 mL via INTRAVESICAL

## 2020-12-14 MED ORDER — DIPHENHYDRAMINE HCL 50 MG/ML IJ SOLN
12.5000 mg | Freq: Four times a day (QID) | INTRAMUSCULAR | Status: DC | PRN
  Administered 2020-12-15: 12.5 mg via INTRAVENOUS

## 2020-12-14 MED ORDER — HYDROMORPHONE HCL 1 MG/ML IJ SOLN
0.2500 mg | INTRAMUSCULAR | Status: DC | PRN
  Administered 2020-12-14 (×4): 0.5 mg via INTRAVENOUS

## 2020-12-14 MED ORDER — SUCCINYLCHOLINE CHLORIDE 200 MG/10ML IV SOSY
PREFILLED_SYRINGE | INTRAVENOUS | Status: DC | PRN
  Administered 2020-12-14: 180 mg via INTRAVENOUS

## 2020-12-14 MED ORDER — OXYCODONE HCL 5 MG PO TABS
ORAL_TABLET | ORAL | Status: AC
  Filled 2020-12-14: qty 2

## 2020-12-14 MED ORDER — ONDANSETRON HCL 4 MG/2ML IJ SOLN
INTRAMUSCULAR | Status: AC
  Filled 2020-12-14: qty 2

## 2020-12-14 MED ORDER — CEFAZOLIN SODIUM-DEXTROSE 2-4 GM/100ML-% IV SOLN
2.0000 g | INTRAVENOUS | Status: AC
  Administered 2020-12-14: 2 g via INTRAVENOUS

## 2020-12-14 MED ORDER — DEXMEDETOMIDINE (PRECEDEX) IN NS 20 MCG/5ML (4 MCG/ML) IV SYRINGE
PREFILLED_SYRINGE | INTRAVENOUS | Status: AC
  Filled 2020-12-14: qty 5

## 2020-12-14 MED ORDER — SPIRONOLACTONE 25 MG PO TABS
25.0000 mg | ORAL_TABLET | Freq: Every day | ORAL | Status: DC
  Administered 2020-12-14: 25 mg via ORAL
  Filled 2020-12-14: qty 1

## 2020-12-14 MED ORDER — SACUBITRIL-VALSARTAN 97-103 MG PO TABS
1.0000 | ORAL_TABLET | Freq: Two times a day (BID) | ORAL | Status: DC
  Administered 2020-12-14: 1 via ORAL
  Filled 2020-12-14: qty 1

## 2020-12-14 MED ORDER — MORPHINE SULFATE (PF) 2 MG/ML IV SOLN
INTRAVENOUS | Status: AC
  Filled 2020-12-14: qty 1

## 2020-12-14 MED ORDER — PROPOFOL 10 MG/ML IV BOLUS
INTRAVENOUS | Status: AC
  Filled 2020-12-14: qty 20

## 2020-12-14 MED ORDER — LACTATED RINGERS IV SOLN: INTRAVENOUS | Status: DC

## 2020-12-14 MED ORDER — OXYCODONE HCL 5 MG PO TABS
ORAL_TABLET | ORAL | Status: AC
  Filled 2020-12-14: qty 1

## 2020-12-14 MED ORDER — PHENYLEPHRINE 40 MCG/ML (10ML) SYRINGE FOR IV PUSH (FOR BLOOD PRESSURE SUPPORT)
PREFILLED_SYRINGE | INTRAVENOUS | Status: AC
  Filled 2020-12-14: qty 30

## 2020-12-14 MED ORDER — ONDANSETRON HCL 4 MG/2ML IJ SOLN: 4.0000 mg | Freq: Once | INTRAMUSCULAR | Status: DC | PRN

## 2020-12-14 MED ORDER — BELLADONNA ALKALOIDS-OPIUM 16.2-60 MG RE SUPP
RECTAL | Status: DC | PRN
  Administered 2020-12-14: 1 via RECTAL

## 2020-12-14 MED ORDER — LIDOCAINE 2% (20 MG/ML) 5 ML SYRINGE
INTRAMUSCULAR | Status: DC | PRN
  Administered 2020-12-14: 60 mg via INTRAVENOUS

## 2020-12-14 SURGICAL SUPPLY — 19 items
BAG DRAIN URO-CYSTO SKYTR STRL (DRAIN) ×2 IMPLANT
BAG DRN RND TRDRP ANRFLXCHMBR (UROLOGICAL SUPPLIES) ×1
BAG DRN UROCATH (DRAIN) ×1
BAG URINE DRAIN 2000ML AR STRL (UROLOGICAL SUPPLIES) ×1 IMPLANT
CATH FOLEY 2WAY SLVR  5CC 18FR (CATHETERS) ×2
CATH FOLEY 2WAY SLVR 5CC 18FR (CATHETERS) IMPLANT
DRSG TELFA 3X8 NADH (GAUZE/BANDAGES/DRESSINGS) ×2 IMPLANT
GLOVE SURG ENC MOIS LTX SZ7.5 (GLOVE) ×2 IMPLANT
GLOVE SURG UNDER POLY LF SZ6 (GLOVE) ×2 IMPLANT
GOWN STRL REUS W/TWL LRG LVL3 (GOWN DISPOSABLE) ×3 IMPLANT
HOLDER FOLEY CATH W/STRAP (MISCELLANEOUS) ×1 IMPLANT
IV NS IRRIG 3000ML ARTHROMATIC (IV SOLUTION) ×3 IMPLANT
KIT TURNOVER CYSTO (KITS) ×2 IMPLANT
LOOP CUT BIPOLAR 24F LRG (ELECTROSURGICAL) ×1 IMPLANT
MANIFOLD NEPTUNE II (INSTRUMENTS) ×2 IMPLANT
PACK CYSTO (CUSTOM PROCEDURE TRAY) ×2 IMPLANT
PAD DRESSING TELFA 3X8 NADH (GAUZE/BANDAGES/DRESSINGS) IMPLANT
TUBE CONNECTING 12X1/4 (SUCTIONS) ×2 IMPLANT
TUBING UROLOGY SET (TUBING) ×2 IMPLANT

## 2020-12-14 NOTE — Anesthesia Postprocedure Evaluation (Signed)
Anesthesia Post Note  Patient: Shermar Friedland  Procedure(s) Performed: TRANSURETHRAL RESECTION OF BLADDER TUMOR (TURBT) 2-5 cm/ POST OPERTIVE INSTILLATION OF GEMCITABINE (Bladder)     Patient location during evaluation: PACU Anesthesia Type: General Level of consciousness: awake and alert, oriented and patient cooperative Pain management: pain level controlled Vital Signs Assessment: post-procedure vital signs reviewed and stable Respiratory status: spontaneous breathing, nonlabored ventilation and respiratory function stable Cardiovascular status: blood pressure returned to baseline and stable Postop Assessment: no apparent nausea or vomiting Anesthetic complications: no   No notable events documented.  Last Vitals:  Vitals:   12/14/20 1537 12/14/20 1545  BP: 109/74 112/73  Pulse: 87 (!) 168  Resp: 18 20  Temp: 36.7 C   SpO2: 96% 96%    Last Pain:  Vitals:   12/14/20 1213  TempSrc: Oral  PainSc: 0-No pain                 Pervis Hocking

## 2020-12-14 NOTE — Anesthesia Preprocedure Evaluation (Addendum)
Anesthesia Evaluation  Patient identified by MRN, date of birth, ID band Patient awake    Reviewed: Allergy & Precautions, NPO status , Patient's Chart, lab work & pertinent test results, reviewed documented beta blocker date and time   History of Anesthesia Complications (+) history of anesthetic complications (unable to tolerate versed and fentanyl sedation and required gen anesthesia for TEE)  Airway Mallampati: IV  TM Distance: >3 FB Neck ROM: Full  Mouth opening: Limited Mouth Opening  Dental  (+) Missing, Chipped, Dental Advisory Given, Poor Dentition,  Very poor dentition throughout Extremely small mouth opening:   Pulmonary sleep apnea ,    Pulmonary exam normal breath sounds clear to auscultation       Cardiovascular hypertension, Pt. on medications and Pt. on home beta blockers Normal cardiovascular exam+ dysrhythmias Atrial Fibrillation  Rhythm:Regular Rate:Normal  Echo 2021: normal   Neuro/Psych PSYCHIATRIC DISORDERS Depression negative neurological ROS     GI/Hepatic negative GI ROS, Neg liver ROS,   Endo/Other  diabetes, Poorly ControlledObesity BMI 39 Last a1c on file 2021: 6.1, but fasting FS in preop today 200  Renal/GU negative Renal ROS Bladder dysfunction  Bladder diverticulum    Musculoskeletal negative musculoskeletal ROS (+)   Abdominal (+) + obese,   Peds  Hematology negative hematology ROS (+)   Anesthesia Other Findings   Reproductive/Obstetrics negative OB ROS                           Anesthesia Physical Anesthesia Plan  ASA: 3  Anesthesia Plan: General   Post-op Pain Management:    Induction: Intravenous and Rapid sequence  PONV Risk Score and Plan: 3 and Ondansetron, Dexamethasone, Midazolam and Treatment may vary due to age or medical condition  Airway Management Planned: Oral ETT and Video Laryngoscope Planned  Additional Equipment:  None  Intra-op Plan:   Post-operative Plan: Extubation in OR  Informed Consent: I have reviewed the patients History and Physical, chart, labs and discussed the procedure including the risks, benefits and alternatives for the proposed anesthesia with the patient or authorized representative who has indicated his/her understanding and acceptance.     Dental advisory given  Plan Discussed with: CRNA  Anesthesia Plan Comments: (Suspect difficult airway- will go directly to glidescope)       Anesthesia Quick Evaluation

## 2020-12-14 NOTE — Anesthesia Procedure Notes (Signed)
Procedure Name: Intubation Date/Time: 12/14/2020 2:28 PM Performed by: Lollie Sails, CRNA Pre-anesthesia Checklist: Patient identified, Emergency Drugs available, Suction available, Patient being monitored and Timeout performed Patient Re-evaluated:Patient Re-evaluated prior to induction Oxygen Delivery Method: Circle system utilized Preoxygenation: Pre-oxygenation with 100% oxygen Induction Type: IV induction Ventilation: Mask ventilation with difficulty Laryngoscope Size: 4 and Glidescope Grade View: Grade I Tube type: Oral Tube size: 7.5 mm Number of attempts: 1 Airway Equipment and Method: Video-laryngoscopy and Rigid stylet Placement Confirmation: ETT inserted through vocal cords under direct vision, positive ETCO2 and breath sounds checked- equal and bilateral Secured at: 24 cm Tube secured with: Tape Dental Injury: Teeth and Oropharynx as per pre-operative assessment  Comments: Planned glidescope intubation due to history and small mouth opening.   Ventilated by CRNA prior to intubation.   Grade 1 view with Glidescope 4 blade.   Smooth intubation.

## 2020-12-14 NOTE — Transfer of Care (Signed)
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  Last Pain:  Vitals:   12/14/20 1213  TempSrc: Oral  PainSc: 0-No pain         Complications: No notable events documented.

## 2020-12-14 NOTE — H&P (Signed)
H&P  Chief Complaint: Bladder mass, gross hematuria  History of Present Illness: Darius [name] is a 65 year old male that a history of gross hematuria.  CT scan of the abdomen and pelvis was obtained August 2022 which revealed a 2-1/2 cm right posterior bladder mass.  There were no lymphadenopathy or bone lesions.  Office cystoscopy confirmed a bladder mass.  He presents today for TURBT.  He is well.  No fevers or chills. He has some pain and straining with voiding especially when he has hematuria. He reports the hematuria has been better over past couple of weeks.    Past Medical History:  Diagnosis Date   Atrial fibrillation ([place])    unable to tolerate versed and fentanyl sedation and required gen anesthesia for TEE;  s/p TEE-DCCV (failed);  Amiodarone started   Bladder mass 10/29/2020   CHF (congestive heart failure) (HCC)    Chronic diastolic heart failure ([place])    a. Echo 03/27/11: EF 50-55%, inferior hypokinesis, moderate LAE, mild RVE, normal pulmonary pressures. ;   b.  TEE (04/03/11): EF 40-45%, mild MR moderate LAE, no LAA clot, mild RAE;  c.  Echocardiogram (02/2013): EF 60-65%, Gr 2 DD, mildly dilated Ao root (Ao root dimension 39 mm), MAC, mild LAE, normal RVF   Depression    Diabetes mellitus, type 2 ([place])    Hx of cardiovascular stress test    Lexiscan Myoview (02/2013): No ischemia or scar, EF 51%, low risk   Morbid obesity (HCC)    Sleep apnea    a. sleep test 01/2013:  very severy OSA   Past Surgical History:  Procedure Laterality Date   CARDIOVERSION  04/01/2011   Procedure: CARDIOVERSION;  Surgeon: Lelon Perla, MD;  Location: [place];  Service: Cardiovascular;  Laterality: N/A;   CARDIOVERSION  04/03/2011   Procedure: CARDIOVERSION;  Surgeon: Lelon Perla, MD;  Location: [facility] ENDOSCOPY;  Service: Cardiovascular;  Laterality: N/A;   CARDIOVERSION  04/03/2011   Procedure: CARDIOVERSION;  Surgeon: Lelon Perla, MD;  Location: [place];  Service: Cardiovascular;   Laterality: N/A;   CARDIOVERSION N/A 06/15/2014   Procedure: CARDIOVERSION;  Surgeon: [name], MD;  Location: [facility] ENDOSCOPY;  Service: Cardiovascular;  Laterality: N/A;   CARDIOVERSION N/A 09/08/2016   Procedure: CARDIOVERSION;  Surgeon: [name], MD;  Location: [facility] ENDOSCOPY;  Service: Cardiovascular;  Laterality: N/A;   I & D EXTREMITY Right 09/19/2015   Procedure: IRRIGATION AND DEBRIDEMENT EXTREMITY;  Surgeon: Mcarthur Rossetti, MD;  Location: [place];  Service: Orthopedics;  Laterality: Right;   RIGHT/LEFT HEART CATH AND CORONARY ANGIOGRAPHY N/A 04/21/2016   Procedure: Right/Left Heart Cath and Coronary Angiography;  Surgeon: [name], MD;  Location: [place] [facility];  Service: Cardiovascular;  Laterality: N/A;   TEE WITHOUT CARDIOVERSION  04/01/2011   Procedure: TRANSESOPHAGEAL ECHOCARDIOGRAM (TEE);  Surgeon: Lelon Perla, MD;  Location: [facility] ENDOSCOPY;  Service: Cardiovascular;  Laterality: N/A;   TEE WITHOUT CARDIOVERSION  04/03/2011   Procedure: TRANSESOPHAGEAL ECHOCARDIOGRAM (TEE);  Surgeon: Lelon Perla, MD;  Location: [facility] ENDOSCOPY;  Service: Cardiovascular;  Laterality: N/A;   TEE WITHOUT CARDIOVERSION  04/03/2011   Procedure: TRANSESOPHAGEAL ECHOCARDIOGRAM (TEE);  Surgeon: Lelon Perla, MD;  Location: [place];  Service: Cardiovascular;  Laterality: N/A;   TEE WITHOUT CARDIOVERSION N/A 04/24/2016   Procedure: TRANSESOPHAGEAL ECHOCARDIOGRAM (TEE);  Surgeon: [name], MD;  Location: [place];  Service: Cardiovascular;  Laterality: N/A;   TEE WITHOUT CARDIOVERSION N/A 05/26/2016   Procedure: TRANSESOPHAGEAL  ECHOCARDIOGRAM (TEE);  Surgeon: [name], MD;  Location: [place];  Service: Cardiovascular;  Laterality: N/A;   TEE WITHOUT CARDIOVERSION N/A 09/08/2016   Procedure: TRANSESOPHAGEAL ECHOCARDIOGRAM (TEE);  Surgeon: [name], MD;  Location: [facility] ENDOSCOPY;  Service: Cardiovascular;  Laterality: N/A;    Home Medications:  Medications  Prior to Admission  Medication Sig Dispense Refill Last Dose   apixaban (ELIQUIS) 5 MG TABS tablet TAKE 1 TABLET (5 MG TOTAL) BY MOUTH 2 (TWO) TIMES DAILY. 60 tablet 0 12/12/2020 at 800   metoprolol (TOPROL-XL) 200 MG 24 hr tablet Take 1 tablet (200 mg total) by mouth daily. Take with or immediately following a meal. 90 tablet 1 12/13/2020   sacubitril-valsartan (ENTRESTO) 97-103 MG Take 1 tablet by mouth 2 (two) times daily. 60 tablet 3 12/14/2020   atorvastatin (LIPITOR) 80 MG tablet TAKE 1 TABLET BY MOUTH AT BEDTIME. 30 tablet 6    ezetimibe (ZETIA) 10 MG tablet Take 1 tablet (10 mg total) by mouth daily. 90 tablet 3    metFORMIN (GLUCOPHAGE) 1000 MG tablet Take 1 tablet (1,000 mg total) by mouth 2 (two) times daily with food. 60 tablet 1    spironolactone (ALDACTONE) 25 MG tablet Take 1 tablet (25 mg total) by mouth daily. 40 tablet 0    Allergies: No Known Allergies  Family History  Problem Relation Age of Onset   Diabetes Father        died in his 56s   Hypertension Father    Seizures Mother        died in her 31s   Hypertension Sister    Hypertension Paternal Grandfather    Heart attack Neg Hx    Stroke Neg Hx    Social History:  reports that he has never smoked. He has never used smokeless tobacco. He reports current alcohol use. He reports that he does not currently use drugs.  ROS: A complete review of systems was performed.  All systems are negative except for pertinent findings as noted. Review of Systems  All other systems reviewed and are negative.   Physical Exam:  Vital signs in last 24 hours: Temp:  [97.4 F (36.3 C)] 97.4 F (36.3 C) (11/04 1213) Pulse Rate:  [74] 74 (11/04 1213) Resp:  [18] 18 (11/04 1213) BP: (117)/(93) 117/93 (11/04 1213) SpO2:  [97 %] 97 % (11/04 1213) Weight:  [136.4 kg] 136.4 kg (11/04 1213) General:  Alert and oriented, No acute distress HEENT: Normocephalic, atraumatic Cardiovascular: Regular rate and rhythm Lungs: Regular rate and  effort Abdomen: Soft, nontender, nondistended, no abdominal masses Back: No CVA tenderness Extremities: No edema Neurologic: Grossly intact  Laboratory Data:  Results for orders placed or performed during the hospital encounter of 12/14/20 (from the past 24 hour(s))  I-STAT, chem 8     Status: Abnormal   Collection Time: 12/14/20 12:13 PM  Result Value Ref Range   Sodium 138 135 - 145 mmol/L   Potassium 4.0 3.5 - 5.1 mmol/L   Chloride 102 98 - 111 mmol/L   BUN 19 8 - 23 mg/dL   Creatinine, Ser 1.30 (H) 0.61 - 1.24 mg/dL   Glucose, Bld 200 (H) 70 - 99 mg/dL   Calcium, Ion 1.18 1.15 - 1.40 mmol/L   TCO2 22 22 - 32 mmol/L   Hemoglobin 17.7 (H) 13.0 - 17.0 g/dL   HCT 52.0 39.0 - 52.0 %   No results found for this or any previous visit (from the past 240 hour(s)). Creatinine:  Recent Labs    12/14/20 1213  CREATININE 1.30*    Impression/Assessment/plan:  Bladder mass - I discussed with the patient the nature, potential benefits, risks and alternatives to TURBT, post-op instillation of gemcitabine, including side effects of the proposed treatment, the likelihood of the patient achieving the goals of the procedure, and any potential problems that might occur during the procedure or recuperation. We also discussed he may need a ureteral stent or a prolonged Foley catheter. All questions answered. Patient elects to proceed.    Festus Aloe 12/14/2020

## 2020-12-14 NOTE — Op Note (Signed)
Preoperative diagnosis: Bladder neoplasm uncertain malignant potential, gross hematuria, bladder erythema  Postoperative diagnosis: Same  Procedure: TURBT 2 to 5 cm, bladder biopsies, postoperative instillation of gemcitabine in PACU  Surgeon: Junious Silk  Anesthesia: General  Indication for procedure: [name] is a 65 year old male with a history of gross hematuria.  CT scan revealed a 2 and half centimeter bladder tumor which was confirmed with cystoscopy there were other erythematous areas in the bladder.  He was brought for above procedure.  Findings: On exam under anesthesia the penis was circumcised without mass or lesion.  The glans and meatus appeared normal.  The scrotum appeared normal.  On digital rectal exam the prostate was about 30 g and smooth without hard area or nodule.  On cystoscopy the urethra was unremarkable, prostate at lateral lobe hypertrophy which was borderline obstructive.  The trigone and bladder neck had some diffuse bullous edema and erythema which was a consistent theme around his bladder.  This bullous edema and erythema was around the bladder neck along the trigone along the right bladder wall posterior superior up near the dome and a large patch that came off the bladder neck along the left bladder wall.  It looked to me when his bladder was empty the tumor touched all of these areas, so I am not certain if this is reactive to the tumor or diffuse patches of cancer.  All of these areas were biopsied.  The tumor was a high-grade appearing papillary tumor attached to the right bladder wall above the right ureteral orifice and was on a broader base.  It was resected completely and the base sent separately.  The trigone and ureteral orifice ease were in their normal anatomic position and clear E flux bilaterally.  Description of procedure: After consent was obtained patient brought to the operating room.  After adequate anesthesia he was placed in lithotomy position  and prepped and draped in the usual sterile fashion.  A timeout was performed to confirm patient and procedure.  Cystoscope was passed per urethra and the bladder inspected.  I then passed the continuous-flow sheath with the visual obturator and swap that out for the handle and loop.  I then resected the right bladder tumor.  This was sent to pathology.  I then resected the base separately.  This was a good resection with no concern for perforation but I was able to get down toward the muscle.  Again, the patches of bullous edema and erythema were diffuse and if they need to be resected it will be a significant surface area of his bladder.  Therefore I took the loop and biopsied the right bladder neck which biopsied the bladder neck and trigone appearing changes.  I then biopsied the right bladder wall, posterior superior patch, and then the left bladder wall.  This biopsied all of the areas of bullous edema and erythema.  The bladder wall was thickened in these areas.  Hemostasis was excellent at low pressure.  Lidocaine jelly was instilled per urethra and an 58 French Foley catheter placed left to gravity drainage.  We then did an exam under anesthesia and placed a BNO suppository.  No palpable bladder mass was noted and again the prostate felt benign.  The patient was awakened taken the cover room in stable condition.  Postoperative instillation of gemcitabine: Gemcitabine 2000 mg was instilled per urethra into the bladder and left indwelling for 50 minutes.  It was then drained and the catheter left to gravity drainage.  Complications:  None  Blood loss: Minimal  Specimens to pathology: #1 right bladder tumor #2 right bladder tumor base #3 right bladder neck biopsy #4 right bladder biopsy #5 posterior superior biopsy #6 left bladder biopsy  Drains: 18 French Foley catheter  Disposition: Patient stable to [place] alone and does not have reliable transportation.  He will be kept  in the [facility] overnight.  Discharge in the a.m.  I will have Foley catheter removed about 5 AM.

## 2020-12-15 ENCOUNTER — Other Ambulatory Visit (HOSPITAL_COMMUNITY): Payer: Self-pay

## 2020-12-15 DIAGNOSIS — D303 Benign neoplasm of bladder: Secondary | ICD-10-CM | POA: Diagnosis not present

## 2020-12-15 LAB — GLUCOSE, CAPILLARY: Glucose-Capillary: 150 mg/dL — ABNORMAL HIGH (ref 70–99)

## 2020-12-15 MED ORDER — OXYCODONE HCL 5 MG PO TABS
5.0000 mg | ORAL_TABLET | ORAL | 0 refills | Status: DC | PRN
  Filled 2020-12-15: qty 30, 5d supply, fill #0

## 2020-12-15 MED ORDER — DIPHENHYDRAMINE HCL 50 MG/ML IJ SOLN
INTRAMUSCULAR | Status: AC
  Filled 2020-12-15: qty 1

## 2020-12-15 MED ORDER — OXYCODONE HCL 5 MG PO TABS
ORAL_TABLET | ORAL | Status: AC
  Filled 2020-12-15: qty 1

## 2020-12-15 MED ORDER — HYOSCYAMINE SULFATE 0.125 MG SL SUBL
SUBLINGUAL_TABLET | SUBLINGUAL | Status: AC
  Filled 2020-12-15: qty 1

## 2020-12-15 MED ORDER — ACETAMINOPHEN 325 MG PO TABS
ORAL_TABLET | ORAL | Status: AC
  Filled 2020-12-15: qty 2

## 2020-12-15 NOTE — Discharge Summary (Signed)
Date of admission: 12/14/2020  Date of discharge: 12/15/2020  Admission diagnosis: bladder tumor   Discharge diagnosis: bladder tumor  Secondary diagnoses:  Patient Active Problem List   Diagnosis Date Noted   Bladder neoplasm of uncertain malignant potential 12/13/2020   Urine leukocytes 05/24/2017   Atrial fibrillation with RVR ([place]) 04/17/2016   Elevated troponin 04/17/2016   CHF (congestive heart failure) ([place]) 04/17/2016   Septic olecranon bursitis of right elbow 09/19/2015   Lactic acid acidosis    Dilated cardiomyopathy ([place]) 04/19/2014   Type 2 diabetes mellitus ([place]) 04/09/2014   Non compliance with medical treatment and diet 04/09/2014   Chronic anticoagulation-Eliquis started 04/06/14 04/09/2014   Abnormal chest CT-LUL nodule- needs f/u May 2016 04/09/2014   Acute on chronic combined systolic and diastolic heart failure ([place]) 04/05/2014   OSA - C-pap 03/22/2013   Persistent atrial fibrillation ([place]) 03/27/2011   Obesity, Class III, BMI 40-49.9 (morbid obesity) ([place]) 03/27/2011   URI, acute 03/27/2011    Procedures performed: Procedure(s): TRANSURETHRAL RESECTION OF BLADDER TUMOR (TURBT) 2-5 cm/ POST OPERTIVE INSTILLATION OF GEMCITABINE  History and Physical: For full details, please see admission history and physical. Briefly, Darius [name] is a 65 y.o. year old patient with history of gross hematuria found to have a medium bladder tumor. He presented for transurethral resection of bladder tumor with gemcitabine instillation.  Hospital Course: Patient tolerated the procedure well.  He was then transferred to the floor after an uneventful PACU stay. He had foley catheter placed for gemcitabine instillation which was released in 1 hour in the PACU. He was admitted overnight due to transportation issues.  His hospital course was uncomplicated.  His foley catheter was removed on POD1 and he passed a TOV. On POD#1 he had met discharge criteria: was eating a regular diet, was up  and ambulating independently,  pain was well controlled, was voiding without a catheter, and was ready to for discharge.   Laboratory values:  Recent Labs    12/14/20 1213  HGB 17.7*  HCT 52.0   Recent Labs    12/14/20 1213  NA 138  K 4.0  CL 102  GLUCOSE 200*  BUN 19  CREATININE 1.30*   No results for input(s): LABPT, INR in the last 72 hours. No results for input(s): LABURIN in the last 72 hours. Results for orders placed or performed in visit on 10/29/20  Microscopic Examination     Status: Abnormal   Collection Time: 10/29/20 11:18 AM   Urine  Result Value Ref Range Status   WBC, UA 6-10 (A) 0 - 5 /hpf Final   RBC 3-10 (A) 0 - 2 /hpf Final   Epithelial Cells (non renal) >10 (A) 0 - 10 /hpf Final   Renal Epithel, UA >10 (A) None seen /hpf Final   Bacteria, UA Few None seen/Few Final  Urine Culture     Status: None   Collection Time: 10/29/20  1:24 PM   Specimen: Urine, Clean Catch   UR  Result Value Ref Range Status   Urine Culture, Routine Final report  Final   Organism ID, Bacteria Comment  Final    Comment: Mixed urogenital flora Less than 10,000 colonies/mL     Disposition: Home  Discharge instruction: The patient was instructed to be ambulatory but told to refrain from heavy lifting, strenuous activity, or driving.   Discharge medications:  Allergies as of 12/15/2020   No Known Allergies      Medication List     TAKE  these medications    apixaban 5 MG Tabs tablet Commonly known as: ELIQUIS TAKE 1 TABLET (5 MG TOTAL) BY MOUTH 2 (TWO) TIMES DAILY. Start taking on: December 17, 2020 What changed: These instructions start on December 17, 2020. If you are unsure what to do until then, ask your doctor or other care provider.   atorvastatin 80 MG tablet Commonly known as: LIPITOR TAKE 1 TABLET BY MOUTH AT BEDTIME.   Entresto 97-103 MG Generic drug: sacubitril-valsartan Take 1 tablet by mouth 2 (two) times daily.   ezetimibe 10 MG tablet Commonly  known as: ZETIA Take 1 tablet (10 mg total) by mouth daily.   metFORMIN 1000 MG tablet Commonly known as: GLUCOPHAGE Take 1 tablet (1,000 mg total) by mouth 2 (two) times daily with food.   metoprolol 200 MG 24 hr tablet Commonly known as: TOPROL-XL Take 1 tablet (200 mg total) by mouth daily. Take with or immediately following a meal.   oxyCODONE 5 MG immediate release tablet Commonly known as: Oxy IR/ROXICODONE Take 1 tablet  by mouth every 4 hours as needed for moderate pain. Notes to patient: May take next dose at 11:30 am   spironolactone 25 MG tablet Commonly known as: ALDACTONE Take 1 tablet (25 mg total) by mouth daily.        Followup:   Follow-up Information     Festus Aloe, MD. Call.   Specialty: Urology Contact information: [address] [place] [address] [phone]

## 2020-12-17 ENCOUNTER — Encounter (HOSPITAL_BASED_OUTPATIENT_CLINIC_OR_DEPARTMENT_OTHER): Payer: Self-pay | Admitting: Urology

## 2020-12-18 LAB — SURGICAL PATHOLOGY

## 2020-12-24 ENCOUNTER — Ambulatory Visit (HOSPITAL_COMMUNITY)
Admission: RE | Admit: 2020-12-24 | Discharge: 2020-12-24 | Disposition: A | Discharge status: Home / Self Care | Payer: Medicare Other | Source: Ambulatory Visit | Attending: Internal Medicine | Admitting: Internal Medicine

## 2020-12-24 DIAGNOSIS — E785 Hyperlipidemia, unspecified: Secondary | ICD-10-CM | POA: Diagnosis not present

## 2020-12-24 LAB — HEPATIC FUNCTION PANEL
ALT: 45 U/L — ABNORMAL HIGH (ref 0–44)
AST: 22 U/L (ref 15–41)
Albumin: 3.4 g/dL — ABNORMAL LOW (ref 3.5–5.0)
Alkaline Phosphatase: 73 U/L (ref 38–126)
Bilirubin, Direct: 0.2 mg/dL (ref 0.0–0.2)
Indirect Bilirubin: 1 mg/dL — ABNORMAL HIGH (ref 0.3–0.9)
Total Bilirubin: 1.2 mg/dL (ref 0.3–1.2)
Total Protein: 6.9 g/dL (ref 6.5–8.1)

## 2020-12-24 LAB — LIPID PANEL
Cholesterol: 163 mg/dL (ref 0–200)
HDL: 48 mg/dL (ref >40)
LDL Cholesterol: 86 mg/dL (ref 0–99)
Total CHOL/HDL Ratio: 3.4 RATIO
Triglycerides: 144 mg/dL (ref <150)
VLDL: 29 mg/dL (ref 0–40)

## 2020-12-25 ENCOUNTER — Telehealth: Payer: Self-pay

## 2020-12-25 NOTE — Telephone Encounter (Signed)
Patient called office today to question denial for recent surgery.  Patient had procedure done by Dr. Junious Silk at [facility] scheduled by [facility] at [facility].  Information provided to patient to reach out to [facility] scheduler to inquire about billing and PA for surgery. Patient voiced understanding.

## 2020-12-31 ENCOUNTER — Other Ambulatory Visit: Payer: Self-pay

## 2020-12-31 ENCOUNTER — Ambulatory Visit (INDEPENDENT_AMBULATORY_CARE_PROVIDER_SITE_OTHER): Payer: Medicare Other | Admitting: Urology

## 2020-12-31 ENCOUNTER — Encounter: Payer: Self-pay | Admitting: Urology

## 2020-12-31 VITALS — BP 121/68 | HR 54 | Temp 97.4°F

## 2020-12-31 DIAGNOSIS — R3915 Urgency of urination: Secondary | ICD-10-CM | POA: Diagnosis not present

## 2020-12-31 DIAGNOSIS — C678 Malignant neoplasm of overlapping sites of bladder: Secondary | ICD-10-CM | POA: Diagnosis not present

## 2020-12-31 NOTE — Progress Notes (Signed)
12/31/2020 11:15 AM   Darius [name] 07/31/1955 [id number]  Referring provider: Vevelyn Francois, NP [address] [place],  [place] [address]  No chief complaint on file.   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  Had a 2.5 cm right wall tumor completely resected which was T2 and then large patches on the bladder neck, right bladder wall, superior, left bladder wall with residual disease.  He presents to review path. He was never a smoker but his parents smoked.   Biopsy/TUR: November 2022-TUR with gemcitabine - HG T2 right, right BN HG CIS, right wall HG T1,  superior HG T1, left bladder wall HG CIS  Staging: Aug 2022 CT abd and pelvis 08/22 - a 2.5 cm right posterior bladder mass. No LAD or bone lesions.    AUASS = 14. Urgency and nocturia. Of note, he has a sleep study pending from pulmonology.  He has obesity. His Cr was 1.01 Nov 2020.  He did say now that he has healed up from the TUR BT some of the frequency urgency has improved.   He has a history of A. fib and an atrial thrombus and is on Eliquis. 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    Today, seen for the above. No further gross hematuria.   PMH: Past Medical History:  Diagnosis Date   Atrial fibrillation ([place])    unable to tolerate versed and fentanyl sedation and required gen anesthesia for TEE;  s/p TEE-DCCV (failed);  Amiodarone started   Bladder mass 10/29/2020   CHF (congestive heart failure) (HCC)    Chronic diastolic heart failure ([place])    a. Echo 03/27/11: EF 50-55%, inferior hypokinesis, moderate LAE, mild RVE, normal pulmonary pressures. ;   b.  TEE (04/03/11): EF 40-45%, mild MR moderate LAE, no LAA clot, mild RAE;  c.  Echocardiogram (02/2013): EF 60-65%, Gr 2 DD, mildly dilated Ao root (Ao root dimension 39 mm), MAC,  mild LAE, normal RVF   Depression    Diabetes mellitus, type 2 ([place])    Hx of cardiovascular stress test    Lexiscan Myoview (02/2013): No ischemia or scar, EF 51%, low risk   Morbid obesity (HCC)    Sleep apnea    a. sleep test 01/2013:  very severy OSA    Surgical History: Past Surgical History:  Procedure Laterality Date   CARDIOVERSION  04/01/2011   Procedure: CARDIOVERSION;  Surgeon: Lelon Perla, MD;  Location: [place];  Service: Cardiovascular;  Laterality: N/A;   CARDIOVERSION  04/03/2011   Procedure: CARDIOVERSION;  Surgeon: Lelon Perla, MD;  Location: [facility] ENDOSCOPY;  Service: Cardiovascular;  Laterality: N/A;   CARDIOVERSION  04/03/2011   Procedure: CARDIOVERSION;  Surgeon: Lelon Perla, MD;  Location: [place];  Service: Cardiovascular;  Laterality: N/A;   CARDIOVERSION N/A 06/15/2014   Procedure: CARDIOVERSION;  Surgeon: [name], MD;  Location: [facility] ENDOSCOPY;  Service: Cardiovascular;  Laterality: N/A;   CARDIOVERSION N/A 09/08/2016   Procedure: CARDIOVERSION;  Surgeon: [name], MD;  Location: [facility] ENDOSCOPY;  Service: Cardiovascular;  Laterality: N/A;   I & D EXTREMITY Right 09/19/2015   Procedure: IRRIGATION AND DEBRIDEMENT EXTREMITY;  Surgeon: Mcarthur Rossetti, MD;  Location: [place];  Service: Orthopedics;  Laterality: Right;   RIGHT/LEFT HEART CATH AND CORONARY ANGIOGRAPHY N/A 04/21/2016  Procedure: Right/Left Heart Cath and Coronary Angiography;  Surgeon: [name], MD;  Location: [place] CV LAB;  Service: Cardiovascular;  Laterality: N/A;   TEE WITHOUT CARDIOVERSION  04/01/2011   Procedure: TRANSESOPHAGEAL ECHOCARDIOGRAM (TEE);  Surgeon: Lelon Perla, MD;  Location: [facility] ENDOSCOPY;  Service: Cardiovascular;  Laterality: N/A;   TEE WITHOUT CARDIOVERSION  04/03/2011   Procedure: TRANSESOPHAGEAL ECHOCARDIOGRAM (TEE);  Surgeon: Lelon Perla, MD;  Location: [facility] ENDOSCOPY;  Service: Cardiovascular;  Laterality: N/A;   TEE WITHOUT  CARDIOVERSION  04/03/2011   Procedure: TRANSESOPHAGEAL ECHOCARDIOGRAM (TEE);  Surgeon: Lelon Perla, MD;  Location: [place];  Service: Cardiovascular;  Laterality: N/A;   TEE WITHOUT CARDIOVERSION N/A 04/24/2016   Procedure: TRANSESOPHAGEAL ECHOCARDIOGRAM (TEE);  Surgeon: [name], MD;  Location: [place];  Service: Cardiovascular;  Laterality: N/A;   TEE WITHOUT CARDIOVERSION N/A 05/26/2016   Procedure: TRANSESOPHAGEAL ECHOCARDIOGRAM (TEE);  Surgeon: [name], MD;  Location: [place];  Service: Cardiovascular;  Laterality: N/A;   TEE WITHOUT CARDIOVERSION N/A 09/08/2016   Procedure: TRANSESOPHAGEAL ECHOCARDIOGRAM (TEE);  Surgeon: [name], MD;  Location: [facility] ENDOSCOPY;  Service: Cardiovascular;  Laterality: N/A;   TRANSURETHRAL RESECTION OF BLADDER TUMOR N/A 12/14/2020   Procedure: TRANSURETHRAL RESECTION OF BLADDER TUMOR (TURBT) 2-5 cm/ POST OPERTIVE INSTILLATION OF GEMCITABINE;  Surgeon: Festus Aloe, MD;  Location: [facility];  Service: Urology;  Laterality: N/A;    Home Medications:  Allergies as of 12/31/2020   No Known Allergies      Medication List        Accurate as of December 31, 2020 11:15 AM. If you have any questions, ask your nurse or doctor.          apixaban 5 MG Tabs tablet Commonly known as: ELIQUIS TAKE 1 TABLET (5 MG TOTAL) BY MOUTH 2 (TWO) TIMES DAILY.   atorvastatin 80 MG tablet Commonly known as: LIPITOR TAKE 1 TABLET BY MOUTH AT BEDTIME.   Entresto 97-103 MG Generic drug: sacubitril-valsartan Take 1 tablet by mouth 2 (two) times daily.   ezetimibe 10 MG tablet Commonly known as: ZETIA Take 1 tablet (10 mg total) by mouth daily.   metFORMIN 1000 MG tablet Commonly known as: GLUCOPHAGE Take 1 tablet (1,000 mg total) by mouth 2 (two) times daily with food.   metoprolol 200 MG 24 hr tablet Commonly known as: TOPROL-XL Take 1 tablet (200 mg total) by mouth daily. Take with or immediately following a  meal.   oxyCODONE 5 MG immediate release tablet Commonly known as: Oxy IR/ROXICODONE Take 1 tablet  by mouth every 4 hours as needed for moderate pain.   spironolactone 25 MG tablet Commonly known as: ALDACTONE Take 1 tablet (25 mg total) by mouth daily.        Allergies: No Known Allergies  Family History: Family History  Problem Relation Age of Onset   Diabetes Father        died in his 65s   Hypertension Father    Seizures Mother        died in her 74s   Hypertension Sister    Hypertension Paternal Grandfather    Heart attack Neg Hx    Stroke Neg Hx     Social History:  reports that he has never smoked. He has never used smokeless tobacco. He reports current alcohol use. He reports that he does not currently use drugs.   Physical Exam: There were no vitals taken for this visit.  Constitutional:  Alert  and oriented, No acute distress. HEENT: [place] AT, moist mucus membranes.  Trachea midline, no masses. Cardiovascular: No clubbing, cyanosis, or edema. Respiratory: Normal respiratory effort, no increased work of breathing. GI: Abdomen is soft, nontender, nondistended, no abdominal masses Skin: No rashes, bruises or suspicious lesions. Neurologic: Grossly intact, no focal deficits, moving all 4 extremities. Psychiatric: Normal mood and affect.  Laboratory Data: Lab Results  Component Value Date   WBC 7.7 10/23/2020   HGB 17.7 (H) 12/14/2020   HCT 52.0 12/14/2020   MCV 88.9 10/23/2020   PLT 192 10/23/2020    Lab Results  Component Value Date   CREATININE 1.30 (H) 12/14/2020    No results found for: PSA  No results found for: TESTOSTERONE  Lab Results  Component Value Date   HGBA1C 6.7 (A) 10/20/2019   HGBA1C 6.7 10/20/2019   HGBA1C 6.7 (A) 10/20/2019   HGBA1C 6.7 10/20/2019    Urinalysis    Component Value Date/Time   COLORURINE AMBER (A) 07/23/2020 1500   APPEARANCEUR Cloudy (A) 10/29/2020 1118   LABSPEC 1.025 07/23/2020 1500   PHURINE 5.0  07/23/2020 1500   GLUCOSEU 2+ (A) 10/29/2020 1118   HGBUR MODERATE (A) 07/23/2020 1500   BILIRUBINUR Negative 10/29/2020 1118   KETONESUR 5 (A) 07/23/2020 1500   PROTEINUR 2+ (A) 10/29/2020 1118   PROTEINUR 100 (A) 07/23/2020 1500   UROBILINOGEN 0.2 10/20/2019 1137   UROBILINOGEN 0.2 02/13/2017 1540   NITRITE Negative 10/29/2020 1118   NITRITE NEGATIVE 07/23/2020 1500   LEUKOCYTESUR 1+ (A) 10/29/2020 1118   LEUKOCYTESUR SMALL (A) 07/23/2020 1500    Lab Results  Component Value Date   LABMICR See below: 10/29/2020   WBCUA 6-10 (A) 10/29/2020   LABEPIT >10 (A) 10/29/2020   BACTERIA Few 10/29/2020    Pertinent Imaging:    Assessment & Plan:    1. Urgency of urination   2.  Bladder cancer-I had a long discussion with the patient going over his pathology. We discussed his stage, grade and prognosis. We discussed the nature risk and benefits of TUR alone which I did not recommend.  Radical cystoprostatectomy with ileal conduit with possible neoadjuvant chemotherapy.  Finally, max TUR with chemoradiation.  Given his comorbidities and that the right wall tumor was T2, smaller size without hydro and completely resected, he is a reasonable candidate for trimodal bladder preservation therapy.  He would need another extensive TUR to clear out the bladder neck, right wall, superior and left wall of residual patches of CIS and T1 disease. I will schedule repeat / max TUR and refer to Dr. Alen Blew.  I will get a chest x-ray with his preop imaging.   No follow-ups on file.  Festus Aloe, MD  [facility]  [address] [place], [place] [address] [phone]

## 2020-12-31 NOTE — Progress Notes (Signed)

## 2020-12-31 NOTE — Progress Notes (Signed)
Surgery sheet faxed to [facility] at [facility] for scheduling with Dr. Junious Silk.

## 2021-01-02 ENCOUNTER — Telehealth: Payer: Self-pay | Admitting: Oncology

## 2021-01-02 NOTE — Telephone Encounter (Signed)
Scheduled appt per 11/21 referral. Pt is aware of appt date and time.  

## 2021-01-15 ENCOUNTER — Other Ambulatory Visit (HOSPITAL_COMMUNITY): Payer: Self-pay

## 2021-01-17 ENCOUNTER — Other Ambulatory Visit (HOSPITAL_COMMUNITY): Payer: Self-pay

## 2021-01-17 ENCOUNTER — Inpatient Hospital Stay: Payer: Medicare Other | Attending: Oncology | Admitting: Oncology

## 2021-01-17 ENCOUNTER — Other Ambulatory Visit: Payer: Self-pay

## 2021-01-17 VITALS — BP 135/97 | HR 98 | Temp 97.9°F | Resp 18 | Ht 74.0 in | Wt 302.4 lb

## 2021-01-17 DIAGNOSIS — C675 Malignant neoplasm of bladder neck: Secondary | ICD-10-CM

## 2021-01-17 DIAGNOSIS — C679 Malignant neoplasm of bladder, unspecified: Secondary | ICD-10-CM

## 2021-01-17 DIAGNOSIS — C674 Malignant neoplasm of posterior wall of bladder: Secondary | ICD-10-CM

## 2021-01-17 MED ORDER — PROCHLORPERAZINE MALEATE 10 MG PO TABS
10.0000 mg | ORAL_TABLET | Freq: Four times a day (QID) | ORAL | 0 refills | Status: DC | PRN
  Filled 2021-01-17 – 2021-01-24 (×2): qty 30, 8d supply, fill #0

## 2021-01-17 NOTE — Progress Notes (Signed)
START ON PATHWAY REGIMEN - Bladder     A cycle is every 7 days:     Cisplatin   **Always confirm dose/schedule in your pharmacy ordering system**  Patient Characteristics: Pre-Cystectomy or Nonsurgical Candidate (Clinical Staging), cT2-4a, cN0-1, M0, Cystectomy Ineligible Therapeutic Status: Pre-Cystectomy or Nonsurgical Candidate (Clinical Staging) AJCC M Category: cM0 AJCC 8 Stage Grouping: II AJCC T Category: cT2 AJCC N Category: cN0 Intent of Therapy: Curative Intent, Discussed with Patient

## 2021-01-17 NOTE — Progress Notes (Signed)
Reason for the request:    Bladder cancer  HPI: I was asked by Dr. Junious Silk to evaluate [name] for the evaluation of bladder cancer.  He is a 65 year old man with history of diabetes, coronary disease as well as atrial fibrillation who was diagnosed with bladder cancer with the care of Dr. Junious Silk.  He initially presented in August 2022 with hematuria and CT scan of the abdomen and pelvis showed a 2.5 cm right posterior bladder mass without any lymphadenopathy.  He underwent TURBT on December 14, 2020 with a final pathology showed high-grade urothelial carcinoma with muscle invasion at the right bladder wall, another tumor at the base as well as the bladder neck.  Other areas of invasion noted in the left wall, posterior superior wall and the right wall.  The patient was felt to be a poor candidate for radical cystectomy and a reasonable candidate for try modality approach.  He will have a repeat TURBT in the near future.  Clinically, he reports feeling well overall without any major complaints.  He does report urinary frequency but no hematuria or dysuria.  He continues to live independently and attends to activities of daily living.  He does not report any headaches, blurry vision, syncope or seizures. Does not report any fevers, chills or sweats.  Does not report any cough, wheezing or hemoptysis.  Does not report any chest pain, palpitation, orthopnea or leg edema.  Does not report any nausea, vomiting or abdominal pain.  Does not report any constipation or diarrhea.  Does not report any skeletal complaints.    Does not report frequency, urgency or hematuria.  Does not report any skin rashes or lesions. Does not report any heat or cold intolerance.  Does not report any lymphadenopathy or petechiae.  Does not report any anxiety or depression.  Remaining review of systems is negative.     Past Medical History:  Diagnosis Date   Atrial fibrillation ([place])    unable to tolerate versed and fentanyl  sedation and required gen anesthesia for TEE;  s/p TEE-DCCV (failed);  Amiodarone started   Bladder mass 10/29/2020   CHF (congestive heart failure) (HCC)    Chronic diastolic heart failure ([place])    a. Echo 03/27/11: EF 50-55%, inferior hypokinesis, moderate LAE, mild RVE, normal pulmonary pressures. ;   b.  TEE (04/03/11): EF 40-45%, mild MR moderate LAE, no LAA clot, mild RAE;  c.  Echocardiogram (02/2013): EF 60-65%, Gr 2 DD, mildly dilated Ao root (Ao root dimension 39 mm), MAC, mild LAE, normal RVF   Depression    Diabetes mellitus, type 2 ([place])    Hx of cardiovascular stress test    Lexiscan Myoview (02/2013): No ischemia or scar, EF 51%, low risk   Morbid obesity (HCC)    Sleep apnea    a. sleep test 01/2013:  very severy OSA  :   Past Surgical History:  Procedure Laterality Date   CARDIOVERSION  04/01/2011   Procedure: CARDIOVERSION;  Surgeon: Lelon Perla, MD;  Location: [facility] ENDOSCOPY;  Service: Cardiovascular;  Laterality: N/A;   CARDIOVERSION  04/03/2011   Procedure: CARDIOVERSION;  Surgeon: Lelon Perla, MD;  Location: [facility] ENDOSCOPY;  Service: Cardiovascular;  Laterality: N/A;   CARDIOVERSION  04/03/2011   Procedure: CARDIOVERSION;  Surgeon: Lelon Perla, MD;  Location: [place];  Service: Cardiovascular;  Laterality: N/A;   CARDIOVERSION N/A 06/15/2014   Procedure: CARDIOVERSION;  Surgeon: [name], MD;  Location: [place];  Service: Cardiovascular;  Laterality: N/A;   CARDIOVERSION N/A 09/08/2016   Procedure: CARDIOVERSION;  Surgeon: [name], MD;  Location: [facility] ENDOSCOPY;  Service: Cardiovascular;  Laterality: N/A;   I & D EXTREMITY Right 09/19/2015   Procedure: IRRIGATION AND DEBRIDEMENT EXTREMITY;  Surgeon: Mcarthur Rossetti, MD;  Location: [place];  Service: Orthopedics;  Laterality: Right;   RIGHT/LEFT HEART CATH AND CORONARY ANGIOGRAPHY N/A 04/21/2016   Procedure: Right/Left Heart Cath and Coronary Angiography;  Surgeon: [name], MD;  Location:  [place] CV LAB;  Service: Cardiovascular;  Laterality: N/A;   TEE WITHOUT CARDIOVERSION  04/01/2011   Procedure: TRANSESOPHAGEAL ECHOCARDIOGRAM (TEE);  Surgeon: Lelon Perla, MD;  Location: [facility] ENDOSCOPY;  Service: Cardiovascular;  Laterality: N/A;   TEE WITHOUT CARDIOVERSION  04/03/2011   Procedure: TRANSESOPHAGEAL ECHOCARDIOGRAM (TEE);  Surgeon: Lelon Perla, MD;  Location: [facility] ENDOSCOPY;  Service: Cardiovascular;  Laterality: N/A;   TEE WITHOUT CARDIOVERSION  04/03/2011   Procedure: TRANSESOPHAGEAL ECHOCARDIOGRAM (TEE);  Surgeon: Lelon Perla, MD;  Location: [place];  Service: Cardiovascular;  Laterality: N/A;   TEE WITHOUT CARDIOVERSION N/A 04/24/2016   Procedure: TRANSESOPHAGEAL ECHOCARDIOGRAM (TEE);  Surgeon: [name], MD;  Location: [place];  Service: Cardiovascular;  Laterality: N/A;   TEE WITHOUT CARDIOVERSION N/A 05/26/2016   Procedure: TRANSESOPHAGEAL ECHOCARDIOGRAM (TEE);  Surgeon: [name], MD;  Location: [place];  Service: Cardiovascular;  Laterality: N/A;   TEE WITHOUT CARDIOVERSION N/A 09/08/2016   Procedure: TRANSESOPHAGEAL ECHOCARDIOGRAM (TEE);  Surgeon: [name], MD;  Location: [facility] ENDOSCOPY;  Service: Cardiovascular;  Laterality: N/A;   TRANSURETHRAL RESECTION OF BLADDER TUMOR N/A 12/14/2020   Procedure: TRANSURETHRAL RESECTION OF BLADDER TUMOR (TURBT) 2-5 cm/ POST OPERTIVE INSTILLATION OF GEMCITABINE;  Surgeon: Festus Aloe, MD;  Location[facility];  Service: Urology;  Laterality: N/A;  :   Current Outpatient Medications:    apixaban (ELIQUIS) 5 MG TABS tablet, TAKE 1 TABLET (5 MG TOTAL) BY MOUTH 2 (TWO) TIMES DAILY., Disp: 60 tablet, Rfl: 0   atorvastatin (LIPITOR) 80 MG tablet, TAKE 1 TABLET BY MOUTH AT BEDTIME., Disp: 30 tablet, Rfl: 6   ezetimibe (ZETIA) 10 MG tablet, Take 1 tablet (10 mg total) by mouth daily., Disp: 90 tablet, Rfl: 3   metFORMIN (GLUCOPHAGE) 1000 MG tablet, Take 1 tablet (1,000 mg total) by  mouth 2 (two) times daily with food., Disp: 60 tablet, Rfl: 1   metoprolol (TOPROL-XL) 200 MG 24 hr tablet, Take 1 tablet (200 mg total) by mouth daily. Take with or immediately following a meal., Disp: 90 tablet, Rfl: 1   oxyCODONE (OXY IR/ROXICODONE) 5 MG immediate release tablet, Take 1 tablet  by mouth every 4 hours as needed for moderate pain., Disp: 30 tablet, Rfl: 0   sacubitril-valsartan (ENTRESTO) 97-103 MG, Take 1 tablet by mouth 2 (two) times daily., Disp: 60 tablet, Rfl: 3   spironolactone (ALDACTONE) 25 MG tablet, Take 1 tablet (25 mg total) by mouth daily., Disp: 40 tablet, Rfl: 0:  No Known Allergies:   Family History  Problem Relation Age of Onset   Diabetes Father        died in his 61s   Hypertension Father    Seizures Mother        died in her 100s   Hypertension Sister    Hypertension Paternal Grandfather    Heart attack Neg Hx    Stroke Neg Hx   :   Social History   Socioeconomic History   Marital status: Single  Spouse name: Not on file   Number of children: 0   Years of education: 9   Highest education level: Not on file  Occupational History   Occupation: unemployeed  Tobacco Use   Smoking status: Never   Smokeless tobacco: Never  Vaping Use   Vaping Use: Never used  Substance and Sexual Activity   Alcohol use: Yes    Comment: some   Drug use: Not Currently   Sexual activity: Not on file  Other Topics Concern   Not on file  Social History Narrative   Admits that he eats poorly and eats a lot at work.      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   Social Determinants of Health   Financial Resource Strain: High Risk   Difficulty of Paying Living Expenses: Hard  Food Insecurity: Food Insecurity Present   Worried About Running Out of Food in the Last Year: Sometimes true   Ran Out of Food in the Last Year: Sometimes true  Transportation Needs: No Transportation Needs   Lack of Transportation (Medical): No   Lack of  Transportation (Non-Medical): No  Physical Activity: Inactive   Days of Exercise per Week: 0 days   Minutes of Exercise per Session: 0 min  Stress: Stress Concern Present   Feeling of Stress : To some extent  Social Connections: Socially Isolated   Frequency of Communication with Friends and Family: More than three times a week   Frequency of Social Gatherings with Friends and Family: Never   Attends Religious Services: Never   Marine scientist or Organizations: No   Attends Music therapist: Never   Marital Status: Never married  Human resources officer Violence: Not At Risk   Fear of Current or Ex-Partner: No   Emotionally Abused: No   Physically Abused: No   Sexually Abused: No  :  Pertinent items are noted in HPI.  Exam: Blood pressure (!) 135/97, pulse 98, temperature 97.9 F (36.6 C), temperature source Temporal, resp. rate 18, height _0  (1.88 m), weight (!) 302 lb 6.4 oz (137.2 kg), SpO2 100 %. ECOG 1 General appearance: alert and cooperative appeared without distress. Head: atraumatic without any abnormalities. Eyes: conjunctivae/corneas clear. PERRL.  Sclera anicteric. Throat: lips, mucosa, and tongue normal; without oral thrush or ulcers. Resp: clear to auscultation bilaterally without rhonchi, wheezes or dullness to percussion. Cardio: regular rate and rhythm, S1, S2 normal, no murmur, click, rub or gallop GI: soft, non-tender; bowel sounds normal; no masses,  no organomegaly Skin: Skin color, texture, turgor normal. No rashes or lesions Lymph nodes: Cervical, supraclavicular, and axillary nodes normal. Neurologic: Grossly normal without any motor, sensory or deep tendon reflexes. Musculoskeletal: No joint deformity or effusion.     CMP Latest Ref Rng & Units 12/24/2020 12/14/2020 10/23/2020  Glucose 70 - 99 mg/dL - 200(H) 178(H)  BUN 8 - 23 mg/dL - 19 19  Creatinine 0.61 - 1.24 mg/dL - 1.30(H) 1.22  Sodium 135 - 145 mmol/L - 138 134(L)  Potassium  3.5 - 5.1 mmol/L - 4.0 4.4  Chloride 98 - 111 mmol/L - 102 99  CO2 22 - 32 mmol/L - - 24  Calcium 8.9 - 10.3 mg/dL - - 9.3  Total Protein 6.5 - 8.1 g/dL 6.9 - 6.6  Total Bilirubin 0.3 - 1.2 mg/dL 1.2 - 1.0  Alkaline Phos 38 - 126 U/L 73 - 67  AST 15 - 41 U/L 22 - 17  ALT 0 -  44 U/L 45(H) - 20   CBC    Component Value Date/Time   WBC 7.7 10/23/2020 1156   RBC 5.24 10/23/2020 1156   HGB 17.7 (H) 12/14/2020 1213   HGB 16.8 04/05/2019 1527   HCT 52.0 12/14/2020 1213   HCT 48.6 04/05/2019 1527   PLT 192 10/23/2020 1156   PLT 256 04/05/2019 1527   MCV 88.9 10/23/2020 1156   MCV 89 04/05/2019 1527   MCH 30.3 10/23/2020 1156   MCHC 34.1 10/23/2020 1156   RDW 13.3 10/23/2020 1156   RDW 13.0 04/05/2019 1527   LYMPHSABS 1.6 04/05/2019 1527   MONOABS 0.9 04/18/2016 0508   EOSABS 0.6 (H) 04/05/2019 1527   BASOSABS 0.1 04/05/2019 1527   '  Assessment and Plan:    65 year old with:   1.  Bladder cancer presented with hematuria and found to have multifocal muscle invasive disease in November 2022.  He has T2 N0 without any evidence of metastatic disease.   Treatment options at this time were discussed including no primary surgical therapy after neoadjuvant chemotherapy versus try modality approach.  Given his comorbidities and overall reasonable candidacy for try modality approach, he prefers to go this route.  Maximal surgical revoking is scheduled in the near future.  The logistics of chemotherapy administration with radiation as a definitive modality were discussed.  Complications include nausea, vomiting, myelosuppression, neutropenia and possible sepsis associated with cisplatin therapy.  Carboplatin could be used as an alternative but less preferred.  He would be a reasonable candidate for cisplatin.   After discussion today, he is agreeable to proceed and we will schedule the first cycle of treatment once his radiation appointments are set up.     2.  IV access: Risks and  benefits of using Port-A-Cath versus peripheral veins was discussed today.  Complication associated with Port-A-Cath insertion include bleeding, infection and thrombosis.  After discussing the risks and benefits, we opted to proceed with peripheral veins.   3.  Antiemetics: Prescription for Compazine was made available to him.   4.  Renal function surveillance:  will continue to monitor on cisplatin therapy.   5.  Goals of care: Therapy is curative at this time.   6.  Follow-up:  will be in the immediate future to start chemotherapy.  60  minutes were dedicated to this visit. The time was spent on reviewing laboratory data, imaging studies, discussing treatment options, and answering questions regarding future plan.    A copy of this consult has been forwarded to the requesting physician.

## 2021-01-18 ENCOUNTER — Telehealth: Payer: Self-pay | Admitting: Oncology

## 2021-01-18 NOTE — Telephone Encounter (Signed)
Scheduled per los, patient has been called and voicemail was left regarding upcoming appointments.  

## 2021-01-22 ENCOUNTER — Telehealth: Payer: Self-pay

## 2021-01-22 NOTE — Telephone Encounter (Signed)
Letter has been sent to patient instructing them to call us if they are still interested in completing their sleep study. If we have not received a response from the patient within 30 days of this notice, the order will be cancelled and they will need to discuss the need for a sleep study at their next office visit.  ° °

## 2021-01-24 ENCOUNTER — Other Ambulatory Visit (HOSPITAL_COMMUNITY): Payer: Self-pay

## 2021-01-24 ENCOUNTER — Encounter: Payer: Self-pay | Admitting: Oncology

## 2021-01-24 ENCOUNTER — Other Ambulatory Visit: Payer: Self-pay

## 2021-01-24 ENCOUNTER — Inpatient Hospital Stay: Payer: Medicare Other

## 2021-01-24 NOTE — Progress Notes (Signed)
Met with patient at registration to introduce myself as Arboriculturist.  Discussed one-time Multimedia programmer and qualifications to assist with personal expenses while going through treatment.  Gave him my card if interested in applying and for any additional financial questions or concerns.

## 2021-01-28 NOTE — Progress Notes (Incomplete)
GU Location of Tumor / Histology: Bladder Cancer  LOUIS IVERY presented in August 2022 with hematuria.  Imaging was obtained that showed a 2.5 cm right posterior bladder mass.  CT AP 09/25/2020: 2.5 x 2.0 cm polypoid lesion along the mucosal surface of the posterior right bladder wall near the UVJ, highly suspicious for urothelial neoplasm.  Biopsies of Bladder Tumor 12/14/2020    Past/Anticipated interventions by urology, if any:  Dr. Junious Silk 12/14/2020 -TURBT   Past/Anticipated interventions by medical oncology, if any:  Dr. Alen Blew 01/17/2021 - Treatment options at this time were discussed including no primary surgical therapy after neoadjuvant chemotherapy versus try modality approach. -Given his comorbidities and overall reasonable candidacy for try modality approach, he prefers to go this route. -Maximal surgical revoking is scheduled in the near future. -The logistics of chemotherapy administration with radiation as a definitive modality were discussed. -After discussion today, he is agreeable to proceed and we will schedule the first cycle of treatment once his radiation appointments are set up. -Chemo start 02/20/2020   Weight changes, if any:   Bowel/Bladder complaints, if any:    Nausea/Vomiting, if any:   Pain issues, if any:    SAFETY ISSUES: Prior radiation?  Pacemaker/ICD?  Possible current pregnancy? N/a Is the patient on methotrexate?   Current Complaints / other details:

## 2021-01-29 ENCOUNTER — Ambulatory Visit: Payer: Medicare Other

## 2021-01-29 ENCOUNTER — Ambulatory Visit
Admission: RE | Admit: 2021-01-29 | Payer: Medicare Other | Source: Ambulatory Visit | Attending: Radiation Oncology | Admitting: Radiation Oncology

## 2021-02-06 ENCOUNTER — Other Ambulatory Visit: Payer: Self-pay | Admitting: Urology

## 2021-02-06 NOTE — Progress Notes (Signed)
GU Location of Tumor / Histology: Bladder Ca  Darius [name] presented in August 2022 with hematuria.  Imaging was obtained that showed a 2.5 cm right posterior bladder mass.  CT AP 09/25/2020: 2.5 x 2.0 cm polypoid lesion along the mucosal surface of the posterior right bladder wall near the UVJ, highly suspicious for urothelial neoplasm.  Biopsies of Bladder Tumor 12/14/2020 Dr. Junious Silk    Past/Anticipated interventions by urology, if any:   Dr. Junious Silk 12/14/2020 -TURBT   Past/Anticipated interventions by medical oncology, if any:   Dr. Alen Blew 01/17/2021 - Treatment options at this time were discussed including no primary surgical therapy after neoadjuvant chemotherapy versus try modality approach. -Given his comorbidities and overall reasonable candidacy for try modality approach, he prefers to go this route. -Maximal surgical revoking is scheduled in the near future. -The logistics of chemotherapy administration with radiation as a definitive modality were discussed. -After discussion today, he is agreeable to proceed and we will schedule the first cycle of treatment once his radiation appointments are set up. -Chemo start 02/20/2020 Cisplantin  Weight changes, if any: Stable  IPSS:  20 SHIM:  2  Bowel/Bladder complaints, if any:  No bowel or  urges to go not sure if finishing and may need to go back to restroom.  Nausea/Vomiting, if any:  No  Pain issues, if any:  0/10  SAFETY ISSUES: Prior radiation?  No Pacemaker/ICD?  No Possible current pregnancy?  Male Is the patient on methotrexate?  No  Current Complaints / other details:  Wants to learn more about treatment.

## 2021-02-06 NOTE — Progress Notes (Signed)
Spoke with [employer] and pt stated he is aware and will arrange ride home dos and caregiver night of surgery

## 2021-02-10 ENCOUNTER — Encounter: Payer: Self-pay | Admitting: Oncology

## 2021-02-11 ENCOUNTER — Encounter: Payer: Self-pay | Admitting: Oncology

## 2021-02-12 ENCOUNTER — Ambulatory Visit
Admission: RE | Admit: 2021-02-12 | Discharge: 2021-02-12 | Disposition: A | Discharge status: Home / Self Care | Payer: 59 | Source: Ambulatory Visit | Attending: Radiation Oncology | Admitting: Radiation Oncology

## 2021-02-12 ENCOUNTER — Encounter: Payer: Self-pay | Admitting: Oncology

## 2021-02-12 ENCOUNTER — Other Ambulatory Visit (HOSPITAL_COMMUNITY): Payer: Self-pay

## 2021-02-12 ENCOUNTER — Other Ambulatory Visit: Payer: Self-pay

## 2021-02-12 VITALS — BP 126/69 | HR 96 | Temp 96.0°F | Resp 18 | Ht 74.0 in | Wt 302.4 lb

## 2021-02-12 DIAGNOSIS — G4730 Sleep apnea, unspecified: Secondary | ICD-10-CM | POA: Diagnosis not present

## 2021-02-12 DIAGNOSIS — D414 Neoplasm of uncertain behavior of bladder: Secondary | ICD-10-CM

## 2021-02-12 DIAGNOSIS — I4891 Unspecified atrial fibrillation: Secondary | ICD-10-CM | POA: Diagnosis not present

## 2021-02-12 DIAGNOSIS — R918 Other nonspecific abnormal finding of lung field: Secondary | ICD-10-CM | POA: Insufficient documentation

## 2021-02-12 DIAGNOSIS — C672 Malignant neoplasm of lateral wall of bladder: Secondary | ICD-10-CM | POA: Diagnosis not present

## 2021-02-12 DIAGNOSIS — Z79899 Other long term (current) drug therapy: Secondary | ICD-10-CM | POA: Diagnosis not present

## 2021-02-12 DIAGNOSIS — E119 Type 2 diabetes mellitus without complications: Secondary | ICD-10-CM | POA: Diagnosis not present

## 2021-02-12 DIAGNOSIS — I509 Heart failure, unspecified: Secondary | ICD-10-CM | POA: Diagnosis not present

## 2021-02-12 DIAGNOSIS — E669 Obesity, unspecified: Secondary | ICD-10-CM | POA: Insufficient documentation

## 2021-02-12 NOTE — Progress Notes (Signed)
Radiation Oncology         [phone] ________________________________  Initial outpatient Consultation  Name: Darius [name] MRN: [id number]  Date of Service: 02/12/2021 DOB: [birth year]  VH:QION, Diona Foley, NP  Wyatt Portela, MD   REFERRING PHYSICIAN: Wyatt Portela, MD  DIAGNOSIS: 66 year old male with newly diagnosed high-grade, muscle invasive bladder cancer.    ICD-10-CM   1. Neoplasm of uncertain behavior of bladder  D41.4     2. 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)  C67.2       HISTORY OF PRESENT ILLNESS: Darius [name] is a 66 y.o. male seen at the request of Dr. Alen Blew. He initially presented with hematuria in 07/2020. He was referred to Dr. Junious Silk on 09/10/20, who recommended CT and cystoscopy. CT A/P performed on 09/25/20 showed a 2.5 cm polypoid lesion along mucosal surface of posterior right bladder wall near UVJ, highly suspicious for neoplasm but without any evidence of lymphadenopathy. Also noted was an incidental, 8 mm RLL pulmonary nodule of unknown significance.     He underwent in office cystoscopy on 10/29/20 showing a sizeable tumor in the right posterior bladder and some other areas of erythema.  A TURBT was performed on 12/14/20, under the care of Dr. Junious Silk and final surgical pathology revealed a T2, invasive high-grade muscle invasive urothelial carcinoma. Additional biopsies from the bladder walls and bladder neck also showed invasive and in situ high-grade urothelial carcinoma. He was given a post-operative instillation of gemcitabine at the time of this procedure.  He was not felt to be a surgical candidate given his multiple medical comorbidities with CAD, diabetes and atrial fibrillation.  Therefore, he was referred to medical oncology and met with Dr. Alen Blew on 01/17/21 who recommended concurrent chemoradiation with cisplatin.  He is scheduled for a repeat TURBT for maximal resection, on 02/23/2020, prior to beginning treatment.  He has  been kindly referred to [place] today to discuss the role of radiotherapy in the management of his disease.  PREVIOUS RADIATION THERAPY: No  PAST MEDICAL HISTORY:  Past Medical History:  Diagnosis Date   Atrial fibrillation ([place])    unable to tolerate versed and fentanyl sedation and required gen anesthesia for TEE;  s/p TEE-DCCV (failed);  Amiodarone started   Bladder mass 10/29/2020   CHF (congestive heart failure) (HCC)    Chronic diastolic heart failure ([place])    a. Echo 03/27/11: EF 50-55%, inferior hypokinesis, moderate LAE, mild RVE, normal pulmonary pressures. ;   b.  TEE (04/03/11): EF 40-45%, mild MR moderate LAE, no LAA clot, mild RAE;  c.  Echocardiogram (02/2013): EF 60-65%, Gr 2 DD, mildly dilated Ao root (Ao root dimension 39 mm), MAC, mild LAE, normal RVF   Depression    Diabetes mellitus, type 2 ([place])    Hx of cardiovascular stress test    Lexiscan Myoview (02/2013): No ischemia or scar, EF 51%, low risk   Morbid obesity (HCC)    Sleep apnea    a. sleep test 01/2013:  very severy OSA      PAST SURGICAL HISTORY: Past Surgical History:  Procedure Laterality Date   CARDIOVERSION  04/01/2011   Procedure: CARDIOVERSION;  Surgeon: Lelon Perla, MD;  Location: [facility] ENDOSCOPY;  Service: Cardiovascular;  Laterality: N/A;   CARDIOVERSION  04/03/2011   Procedure: CARDIOVERSION;  Surgeon: Lelon Perla, MD;  Location: [facility] ENDOSCOPY;  Service: Cardiovascular;  Laterality: N/A;   CARDIOVERSION  04/03/2011   Procedure: CARDIOVERSION;  Surgeon: Denice Bors  Stanford Breed, MD;  Location: [place];  Service: Cardiovascular;  Laterality: N/A;   CARDIOVERSION N/A 06/15/2014   Procedure: CARDIOVERSION;  Surgeon: [name], MD;  Location: [place];  Service: Cardiovascular;  Laterality: N/A;   CARDIOVERSION N/A 09/08/2016   Procedure: CARDIOVERSION;  Surgeon: [name], MD;  Location: [facility] ENDOSCOPY;  Service: Cardiovascular;  Laterality: N/A;   I & D EXTREMITY Right 09/19/2015   Procedure:  IRRIGATION AND DEBRIDEMENT EXTREMITY;  Surgeon: Mcarthur Rossetti, MD;  Location: [place];  Service: Orthopedics;  Laterality: Right;   RIGHT/LEFT HEART CATH AND CORONARY ANGIOGRAPHY N/A 04/21/2016   Procedure: Right/Left Heart Cath and Coronary Angiography;  Surgeon: [name], MD;  Location: [place] CV LAB;  Service: Cardiovascular;  Laterality: N/A;   TEE WITHOUT CARDIOVERSION  04/01/2011   Procedure: TRANSESOPHAGEAL ECHOCARDIOGRAM (TEE);  Surgeon: Lelon Perla, MD;  Location: [facility] ENDOSCOPY;  Service: Cardiovascular;  Laterality: N/A;   TEE WITHOUT CARDIOVERSION  04/03/2011   Procedure: TRANSESOPHAGEAL ECHOCARDIOGRAM (TEE);  Surgeon: Lelon Perla, MD;  Location: [facility] ENDOSCOPY;  Service: Cardiovascular;  Laterality: N/A;   TEE WITHOUT CARDIOVERSION  04/03/2011   Procedure: TRANSESOPHAGEAL ECHOCARDIOGRAM (TEE);  Surgeon: Lelon Perla, MD;  Location: [place];  Service: Cardiovascular;  Laterality: N/A;   TEE WITHOUT CARDIOVERSION N/A 04/24/2016   Procedure: TRANSESOPHAGEAL ECHOCARDIOGRAM (TEE);  Surgeon: [name], MD;  Location: [place];  Service: Cardiovascular;  Laterality: N/A;   TEE WITHOUT CARDIOVERSION N/A 05/26/2016   Procedure: TRANSESOPHAGEAL ECHOCARDIOGRAM (TEE);  Surgeon: [name], MD;  Location: [place];  Service: Cardiovascular;  Laterality: N/A;   TEE WITHOUT CARDIOVERSION N/A 09/08/2016   Procedure: TRANSESOPHAGEAL ECHOCARDIOGRAM (TEE);  Surgeon: [name], MD;  Location: [facility] ENDOSCOPY;  Service: Cardiovascular;  Laterality: N/A;   TRANSURETHRAL RESECTION OF BLADDER TUMOR N/A 12/14/2020   Procedure: TRANSURETHRAL RESECTION OF BLADDER TUMOR (TURBT) 2-5 cm/ POST OPERTIVE INSTILLATION OF GEMCITABINE;  Surgeon: Festus Aloe, MD;  Location: [facility];  Service: Urology;  Laterality: N/A;    FAMILY HISTORY:  Family History  Problem Relation Age of Onset   Diabetes Father        died in his 51s   Hypertension Father     Seizures Mother        died in her 61s   Hypertension Sister    Hypertension Paternal Grandfather    Heart attack Neg Hx    Stroke Neg Hx     SOCIAL HISTORY:  Social History   Socioeconomic History   Marital status: Single    Spouse name: Not on file   Number of children: 0   Years of education: 12   Highest education level: Not on file  Occupational History   Occupation: unemployeed  Tobacco Use   Smoking status: Never   Smokeless tobacco: Never  Vaping Use   Vaping Use: Never used  Substance and Sexual Activity   Alcohol use: Yes    Comment: some   Drug use: Not Currently   Sexual activity: Not on file  Other Topics Concern   Not on file  Social History Narrative   Admits that he eats poorly and eats a lot at work.      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   Social Determinants of Health   Financial Resource Strain: High Risk   Difficulty of Paying Living Expenses: Hard  Food Insecurity: Food Insecurity Present   Worried About Running Out of Food in the  Last Year: Sometimes true   Ran Out of Food in the Last Year: Sometimes true  Transportation Needs: No Transportation Needs   Lack of Transportation (Medical): No   Lack of Transportation (Non-Medical): No  Physical Activity: Inactive   Days of Exercise per Week: 0 days   Minutes of Exercise per Session: 0 min  Stress: Stress Concern Present   Feeling of Stress : To some extent  Social Connections: Socially Isolated   Frequency of Communication with Friends and Family: More than three times a week   Frequency of Social Gatherings with Friends and Family: Never   Attends Religious Services: Never   Marine scientist or Organizations: No   Attends Music therapist: Never   Marital Status: Never married  Human resources officer Violence: Not At Risk   Fear of Current or Ex-Partner: No   Emotionally Abused: No   Physically Abused: No   Sexually Abused: No    ALLERGIES:  Patient has no known allergies.  MEDICATIONS:  Current Outpatient Medications  Medication Sig Dispense Refill   apixaban (ELIQUIS) 5 MG TABS tablet TAKE 1 TABLET (5 MG TOTAL) BY MOUTH 2 (TWO) TIMES DAILY. 60 tablet 0   atorvastatin (LIPITOR) 80 MG tablet TAKE 1 TABLET BY MOUTH AT BEDTIME. 30 tablet 6   ezetimibe (ZETIA) 10 MG tablet Take 1 tablet (10 mg total) by mouth daily. 90 tablet 3   metFORMIN (GLUCOPHAGE) 1000 MG tablet Take 1 tablet (1,000 mg total) by mouth 2 (two) times daily with food. 60 tablet 1   metoprolol (TOPROL-XL) 200 MG 24 hr tablet Take 1 tablet (200 mg total) by mouth daily. Take with or immediately following a meal. 90 tablet 1   oxyCODONE (OXY IR/ROXICODONE) 5 MG immediate release tablet Take 1 tablet  by mouth every 4 hours as needed for moderate pain. 30 tablet 0   prochlorperazine (COMPAZINE) 10 MG tablet Take 1 tablet (10 mg total) by mouth every 6 (six) hours as needed for nausea or vomiting. 30 tablet 0   sacubitril-valsartan (ENTRESTO) 97-103 MG Take 1 tablet by mouth 2 (two) times daily. 60 tablet 3   spironolactone (ALDACTONE) 25 MG tablet Take 1 tablet (25 mg total) by mouth daily. 40 tablet 0   No current facility-administered medications for this encounter.    REVIEW OF SYSTEMS:  On review of systems, the patient reports that he is doing well overall. He denies any chest pain, shortness of breath, cough, fevers, chills, night sweats, or unintended weight changes. He denies abdominal pain, nausea or vomiting. He denies any new musculoskeletal or joint aches or pains. His IPSS score was 20, indicating moderate to severe urinary symptoms, mostly frequency, urgency and nocturia x4.  He specifically denies gross hematuria, dysuria, straining to void or incontinence.  A complete review of systems is obtained and is otherwise negative.    PHYSICAL EXAM:  Wt Readings from Last 3 Encounters:  02/12/21 (!) 302 lb 6 oz (137.2 kg)  01/17/21 (!) 302 lb 6.4 oz (137.2 kg)   12/14/20 (!) 300 lb 11.2 oz (136.4 kg)   Temp Readings from Last 3 Encounters:  02/12/21 (!) 96 F (35.6 C)  01/17/21 97.9 F (36.6 C) (Temporal)  12/31/20 (!) 97.4 F (36.3 C)   BP Readings from Last 3 Encounters:  02/12/21 126/69  01/17/21 (!) 135/97  12/31/20 121/68   Pulse Readings from Last 3 Encounters:  02/12/21 96  01/17/21 98  12/31/20 (!) 54   Pain Assessment Pain  Score: 0-No pain/10  In general this is a well appearing Caucasian male in no acute distress. He's alert and oriented x4 and appropriate throughout the examination. Cardiopulmonary assessment is negative for acute distress and he exhibits normal effort.     KPS = 90  100 - Normal; no complaints; no evidence of disease. 90   - Able to carry on normal activity; minor signs or symptoms of disease. 80   - Normal activity with effort; some signs or symptoms of disease. 40   - Cares for self; unable to carry on normal activity or to do active work. 60   - Requires occasional assistance, but is able to care for most of his personal needs. 50   - Requires considerable assistance and frequent medical care. 55   - Disabled; requires special care and assistance. 3   - Severely disabled; hospital admission is indicated although death not imminent. 54   - Very sick; hospital admission necessary; active supportive treatment necessary. 10   - Moribund; fatal processes progressing rapidly. 0     - Dead  Karnofsky DA, Abelmann [place], Craver LS and [facility] [phone]) The use of the nitrogen mustards in the palliative treatment of carcinoma: with particular reference to bronchogenic carcinoma Cancer 1 634-56  LABORATORY DATA:  Lab Results  Component Value Date   WBC 7.7 10/23/2020   HGB 17.7 (H) 12/14/2020   HCT 52.0 12/14/2020   MCV 88.9 10/23/2020   PLT 192 10/23/2020   Lab Results  Component Value Date   NA 138 12/14/2020   K 4.0 12/14/2020   CL 102 12/14/2020   CO2 24 10/23/2020   Lab Results   Component Value Date   ALT 45 (H) 12/24/2020   AST 22 12/24/2020   ALKPHOS 73 12/24/2020   BILITOT 1.2 12/24/2020     RADIOGRAPHY: No results found.    IMPRESSION/PLAN: 1. 66 y.o. male with newly diagnosed high-grade, muscle invasive bladder cancer.  Today, we talked to the patient about the findings and workup thus far. We discussed the natural history of muscle invasive bladder cancer and general treatment, highlighting the role of radiotherapy, in a bladder sparing approach, with curative intent.  We discussed the available radiation techniques, and focused on the details and logistics of delivery.  We also discussed the rationale regarding concurrent radiosensitizing chemotherapy.  We reviewed the anticipated acute and late sequelae associated with radiation in this setting. The patient was encouraged to ask questions that were answered to his satisfaction.  At the end of our conversation, the patient is in agreement to proceed with repeat TURBT for maximal resection, as scheduled on 02/23/2020 followed by a 7-1/2-week course of daily radiation concurrent with chemotherapy.  We will share our discussion with Dr. Junious Silk and Dr. Alen Blew and move forward with treatment planning accordingly.  We will aim to schedule his CT simulation the week of 03/04/2021, in anticipation of beginning his daily radiation treatments the week of March 11, 2021 and we will coordinate these treatments with his weekly chemotherapy.  He appears to have a good understanding of his disease and our treatment recommendations and is comfortable and in agreement with the stated plan.  We personally spent 70 minutes in this encounter including chart review, reviewing radiological studies, meeting face-to-face with the patient, entering orders and completing documentation.    Nicholos Johns, PA-C    Tyler Pita, MD  [facility] Direct Dial: [phone]   Fax: [phone] [email or web address]  Skype   [email or web address]   This document serves as a record of services personally performed by Tyler Pita, MD and [name], PA-C. It was created on their behalf by Wilburn Mylar, a trained medical scribe. The creation of this record is based on the scribe's personal observations and the provider's statements to them. This document has been checked and approved by the attending provider.

## 2021-02-12 NOTE — Progress Notes (Signed)
Pharmacist Chemotherapy Monitoring - Initial Assessment    Anticipated start date: 02/19/21  The following has been reviewed per standard work regarding the patient's treatment regimen: The patient's diagnosis, treatment plan and drug doses, and organ/hematologic function Lab orders and baseline tests specific to treatment regimen  The treatment plan start date, drug sequencing, and pre-medications Prior authorization status  Patient's documented medication list, including drug-drug interaction screen and prescriptions for anti-emetics and supportive care specific to the treatment regimen The drug concentrations, fluid compatibility, administration routes, and timing of the medications to be used The patient's access for treatment and lifetime cumulative dose history, if applicable  The patient's medication allergies and previous infusion related reactions, if applicable   Changes made to treatment plan:  N/A  Follow up needed:  N/A   Larene Beach, RPH, 02/12/2021  2:43 PM

## 2021-02-14 ENCOUNTER — Telehealth: Payer: Self-pay

## 2021-02-14 NOTE — Telephone Encounter (Signed)
The surgery scheduler will take care of this.  He will automatically have an overnight stay with this type of surgery.

## 2021-02-14 NOTE — Telephone Encounter (Signed)
Patient calling to request an overnight stay in hospital after surgery Jan 13th.  Wanting this to be worked out for him with his insurnace, [employer]. [facility] gave number to call - [phone].  Thanks, Helene Kelp

## 2021-02-15 ENCOUNTER — Telehealth: Payer: Self-pay | Admitting: *Deleted

## 2021-02-15 NOTE — Telephone Encounter (Signed)
Returned patient's friend Rosann Auerbach [facility]) phone call, spoke with Zada Girt

## 2021-02-19 ENCOUNTER — Other Ambulatory Visit: Payer: Medicare Other

## 2021-02-19 ENCOUNTER — Ambulatory Visit: Payer: 59

## 2021-02-20 ENCOUNTER — Encounter (HOSPITAL_BASED_OUTPATIENT_CLINIC_OR_DEPARTMENT_OTHER): Payer: Self-pay | Admitting: Urology

## 2021-02-20 ENCOUNTER — Other Ambulatory Visit (HOSPITAL_COMMUNITY): Payer: Self-pay

## 2021-02-20 NOTE — Telephone Encounter (Signed)
Received call from [facility], Dr. Junious Silk surgery scheduler in [place] and informed that patient was given instruction and told he would not have an overnight stay. Offered to call patient to clear up confusion. Darius [name] states there was no need they were able to accommodate patient for an overnight stay. All surgery questions concerning patients that are being done in [place] will need to be forwarded to [facility] staff per [place].

## 2021-02-21 ENCOUNTER — Encounter (HOSPITAL_BASED_OUTPATIENT_CLINIC_OR_DEPARTMENT_OTHER): Payer: Self-pay | Admitting: Urology

## 2021-02-21 ENCOUNTER — Other Ambulatory Visit: Payer: Self-pay

## 2021-02-21 NOTE — Progress Notes (Signed)
Spoke w/ via phone for pre-op interview--- pt Lab needs dos----  [employer] results------ current ekg in epic/ chart COVID test -----patient states asymptomatic no test needed Arrive at ------- 0845 on 02-22-2021 NPO after MN  Med rec completed Medications to take morning of surgery ----- toprol, entresto Diabetic medication ----- do not take metformin morning of surgery Patient instructed no nail polish to be worn day of surgery Patient instructed to bring photo id and insurance card day of surgery Patient aware to have Driver (ride ) / caregiver   for 24 hours after surgery --  friend, Zada Girt Patient Special Instructions ----- reviewed [facility] and visitor guidelines Pre-Op special Istructions ----- pt stated unable to come in earlier [place] Patient verbalized understanding of instructions that were given at this phone interview. Patient denies shortness of breath, chest pain, fever, cough at this phone interview.   Anesthesia Review:  persistent AFib; NICM; Combined chronic CHF;  OSA no cpap used since 2021; Pt denies cardiac s&s, sob, and no peripheral swelling  PCP: Dionisio David NP Cardiologist : Dr Einar Crow (lov  Chest x-ray : 04-17-2016 epic EKG : 12-11-2020 epic Echo : 01-18-2020 epic Stress test: 02-16-2013 epic Cardiac Cath :  04-21-2016 epic Activity level: denies sob w/ any activity Sleep Study/ CPAP : Yes/ No Fasting Blood Sugar :      / Checks Blood Sugar -- times a day:  stated does not check Blood Thinner/ Instructions Maryjane Hurter Dose: Eliquis ASA / Instructions/ Last Dose : no Pt stated given instructions from Dr Junious Silk to stop prior to surgery, last dose pm dose 02-20-2021

## 2021-02-22 ENCOUNTER — Encounter (HOSPITAL_BASED_OUTPATIENT_CLINIC_OR_DEPARTMENT_OTHER): Payer: Self-pay | Admitting: Urology

## 2021-02-22 ENCOUNTER — Ambulatory Visit (HOSPITAL_BASED_OUTPATIENT_CLINIC_OR_DEPARTMENT_OTHER): Payer: 59 | Admitting: Anesthesiology

## 2021-02-22 ENCOUNTER — Encounter (HOSPITAL_BASED_OUTPATIENT_CLINIC_OR_DEPARTMENT_OTHER)
Admission: RE | Disposition: A | Discharge status: Home / Self Care | Payer: Self-pay | Source: Home / Self Care | Attending: Urology

## 2021-02-22 ENCOUNTER — Observation Stay (HOSPITAL_BASED_OUTPATIENT_CLINIC_OR_DEPARTMENT_OTHER)
Admission: RE | Admit: 2021-02-22 | Discharge: 2021-02-23 | Disposition: A | Discharge status: Home / Self Care | Payer: 59 | Attending: Urology | Admitting: Urology

## 2021-02-22 ENCOUNTER — Other Ambulatory Visit: Payer: Self-pay

## 2021-02-22 DIAGNOSIS — E119 Type 2 diabetes mellitus without complications: Secondary | ICD-10-CM | POA: Diagnosis not present

## 2021-02-22 DIAGNOSIS — I4819 Other persistent atrial fibrillation: Secondary | ICD-10-CM | POA: Diagnosis not present

## 2021-02-22 DIAGNOSIS — Z7984 Long term (current) use of oral hypoglycemic drugs: Secondary | ICD-10-CM | POA: Insufficient documentation

## 2021-02-22 DIAGNOSIS — Z79899 Other long term (current) drug therapy: Secondary | ICD-10-CM | POA: Insufficient documentation

## 2021-02-22 DIAGNOSIS — Z7901 Long term (current) use of anticoagulants: Secondary | ICD-10-CM | POA: Diagnosis not present

## 2021-02-22 DIAGNOSIS — C679 Malignant neoplasm of bladder, unspecified: Secondary | ICD-10-CM | POA: Diagnosis present

## 2021-02-22 DIAGNOSIS — I5042 Chronic combined systolic (congestive) and diastolic (congestive) heart failure: Secondary | ICD-10-CM | POA: Insufficient documentation

## 2021-02-22 DIAGNOSIS — C672 Malignant neoplasm of lateral wall of bladder: Secondary | ICD-10-CM

## 2021-02-22 DIAGNOSIS — G4733 Obstructive sleep apnea (adult) (pediatric): Secondary | ICD-10-CM | POA: Diagnosis not present

## 2021-02-22 HISTORY — PX: TRANSURETHRAL RESECTION OF BLADDER TUMOR: SHX2575

## 2021-02-22 HISTORY — DX: Other complications of anesthesia, initial encounter: T88.59XA

## 2021-02-22 HISTORY — DX: Chronic combined systolic (congestive) and diastolic (congestive) heart failure: I50.42

## 2021-02-22 HISTORY — DX: Obstructive sleep apnea (adult) (pediatric): G47.33

## 2021-02-22 LAB — GLUCOSE, CAPILLARY
Glucose-Capillary: 333 mg/dL — ABNORMAL HIGH (ref 70–99)
Glucose-Capillary: 304 mg/dL — ABNORMAL HIGH (ref 70–99)
Glucose-Capillary: 274 mg/dL — ABNORMAL HIGH (ref 70–99)
Glucose-Capillary: 284 mg/dL — ABNORMAL HIGH (ref 70–99)
Glucose-Capillary: 267 mg/dL — ABNORMAL HIGH (ref 70–99)
Glucose-Capillary: 276 mg/dL — ABNORMAL HIGH (ref 70–99)

## 2021-02-22 LAB — POCT I-STAT, CHEM 8
BUN: 23 mg/dL (ref 8–23)
Calcium, Ion: 1.09 mmol/L — ABNORMAL LOW (ref 1.15–1.40)
Chloride: 101 mmol/L (ref 98–111)
Creatinine, Ser: 1.5 mg/dL — ABNORMAL HIGH (ref 0.61–1.24)
Glucose, Bld: 344 mg/dL — ABNORMAL HIGH (ref 70–99)
HCT: 51 % (ref 39.0–52.0)
Hemoglobin: 17.3 g/dL — ABNORMAL HIGH (ref 13.0–17.0)
Potassium: 4.3 mmol/L (ref 3.5–5.1)
Sodium: 133 mmol/L — ABNORMAL LOW (ref 135–145)
TCO2: 23 mmol/L (ref 22–32)

## 2021-02-22 LAB — HIV ANTIBODY (ROUTINE TESTING W REFLEX): HIV Screen 4th Generation wRfx: NONREACTIVE

## 2021-02-22 SURGERY — TURBT (TRANSURETHRAL RESECTION OF BLADDER TUMOR)
Anesthesia: General | Site: Bladder

## 2021-02-22 MED ORDER — LABETALOL HCL 5 MG/ML IV SOLN
INTRAVENOUS | Status: AC
  Filled 2021-02-22: qty 4

## 2021-02-22 MED ORDER — SPIRONOLACTONE 25 MG PO TABS
25.0000 mg | ORAL_TABLET | Freq: Every day | ORAL | Status: DC
  Administered 2021-02-22: 25 mg via ORAL
  Filled 2021-02-22: qty 1

## 2021-02-22 MED ORDER — ONDANSETRON HCL 4 MG/2ML IJ SOLN: 4.0000 mg | INTRAMUSCULAR | Status: DC | PRN

## 2021-02-22 MED ORDER — METFORMIN HCL 500 MG PO TABS
1000.0000 mg | ORAL_TABLET | Freq: Two times a day (BID) | ORAL | Status: DC
  Administered 2021-02-22: 1000 mg via ORAL

## 2021-02-22 MED ORDER — ACETAMINOPHEN 500 MG PO TABS
1000.0000 mg | ORAL_TABLET | Freq: Once | ORAL | Status: AC
  Administered 2021-02-22: 1000 mg via ORAL

## 2021-02-22 MED ORDER — METOPROLOL TARTRATE 5 MG/5ML IV SOLN
INTRAVENOUS | Status: AC
  Filled 2021-02-22: qty 5

## 2021-02-22 MED ORDER — METFORMIN HCL 500 MG PO TABS
1000.0000 mg | ORAL_TABLET | Freq: Two times a day (BID) | ORAL | Status: DC
  Administered 2021-02-22 – 2021-02-23 (×2): 1000 mg via ORAL
  Filled 2021-02-22 (×3): qty 2

## 2021-02-22 MED ORDER — METFORMIN HCL 500 MG PO TABS
1000.0000 mg | ORAL_TABLET | Freq: Two times a day (BID) | ORAL | Status: DC
  Filled 2021-02-22: qty 2

## 2021-02-22 MED ORDER — METOPROLOL TARTRATE 5 MG/5ML IV SOLN
INTRAVENOUS | Status: DC | PRN
  Administered 2021-02-22 (×2): 2 mg via INTRAVENOUS

## 2021-02-22 MED ORDER — FENTANYL CITRATE (PF) 100 MCG/2ML IJ SOLN
INTRAMUSCULAR | Status: AC
  Filled 2021-02-22: qty 2

## 2021-02-22 MED ORDER — ONDANSETRON HCL 4 MG/2ML IJ SOLN
INTRAMUSCULAR | Status: AC
  Filled 2021-02-22: qty 2

## 2021-02-22 MED ORDER — CEFAZOLIN SODIUM-DEXTROSE 2-4 GM/100ML-% IV SOLN
INTRAVENOUS | Status: AC
  Filled 2021-02-22: qty 100

## 2021-02-22 MED ORDER — OXYBUTYNIN CHLORIDE 5 MG PO TABS
ORAL_TABLET | ORAL | Status: AC
  Filled 2021-02-22: qty 1

## 2021-02-22 MED ORDER — ATORVASTATIN CALCIUM 80 MG PO TABS
80.0000 mg | ORAL_TABLET | Freq: Every day | ORAL | Status: DC
  Administered 2021-02-22: 80 mg via ORAL
  Filled 2021-02-22: qty 1

## 2021-02-22 MED ORDER — INSULIN ASPART 100 UNIT/ML IJ SOLN
6.0000 [IU] | Freq: Once | INTRAMUSCULAR | Status: AC
  Administered 2021-02-22: 6 [IU] via SUBCUTANEOUS

## 2021-02-22 MED ORDER — ESMOLOL HCL 100 MG/10ML IV SOLN
INTRAVENOUS | Status: AC
  Filled 2021-02-22: qty 10

## 2021-02-22 MED ORDER — SUGAMMADEX SODIUM 500 MG/5ML IV SOLN
INTRAVENOUS | Status: AC
  Filled 2021-02-22: qty 5

## 2021-02-22 MED ORDER — OXYBUTYNIN CHLORIDE 5 MG PO TABS
5.0000 mg | ORAL_TABLET | Freq: Three times a day (TID) | ORAL | Status: DC | PRN
  Administered 2021-02-22 – 2021-02-23 (×2): 5 mg via ORAL

## 2021-02-22 MED ORDER — STERILE WATER FOR IRRIGATION IR SOLN
Status: DC | PRN
  Administered 2021-02-22: 500 mL

## 2021-02-22 MED ORDER — FENTANYL CITRATE (PF) 100 MCG/2ML IJ SOLN
INTRAMUSCULAR | Status: DC | PRN
  Administered 2021-02-22 (×3): 50 ug via INTRAVENOUS

## 2021-02-22 MED ORDER — SUGAMMADEX SODIUM 200 MG/2ML IV SOLN
INTRAVENOUS | Status: DC | PRN
Start: 2021-02-22 — End: 2021-02-22
  Administered 2021-02-22: 300 mg via INTRAVENOUS

## 2021-02-22 MED ORDER — SACUBITRIL-VALSARTAN 97-103 MG PO TABS
1.0000 | ORAL_TABLET | Freq: Two times a day (BID) | ORAL | Status: DC
  Administered 2021-02-22 (×2): 1 via ORAL
  Filled 2021-02-22 (×2): qty 1

## 2021-02-22 MED ORDER — FENTANYL CITRATE (PF) 100 MCG/2ML IJ SOLN: 25.0000 ug | INTRAMUSCULAR | Status: DC | PRN

## 2021-02-22 MED ORDER — CEFAZOLIN SODIUM-DEXTROSE 2-4 GM/100ML-% IV SOLN
2.0000 g | Freq: Once | INTRAVENOUS | Status: AC
  Administered 2021-02-22: 2 g via INTRAVENOUS

## 2021-02-22 MED ORDER — SODIUM CHLORIDE 0.9 % IR SOLN
Status: DC | PRN
  Administered 2021-02-22: 6000 mL
  Administered 2021-02-22: 3000 mL
  Administered 2021-02-22: 6000 mL

## 2021-02-22 MED ORDER — DEXAMETHASONE SODIUM PHOSPHATE 10 MG/ML IJ SOLN
INTRAMUSCULAR | Status: AC
  Filled 2021-02-22: qty 1

## 2021-02-22 MED ORDER — OXYCODONE HCL 5 MG PO TABS
5.0000 mg | ORAL_TABLET | ORAL | Status: DC | PRN
  Administered 2021-02-22 – 2021-02-23 (×4): 5 mg via ORAL

## 2021-02-22 MED ORDER — DEXMEDETOMIDINE (PRECEDEX) IN NS 20 MCG/5ML (4 MCG/ML) IV SYRINGE
PREFILLED_SYRINGE | INTRAVENOUS | Status: DC | PRN
  Administered 2021-02-22: 12 ug via INTRAVENOUS

## 2021-02-22 MED ORDER — ONDANSETRON HCL 4 MG/2ML IJ SOLN
INTRAMUSCULAR | Status: DC | PRN
  Administered 2021-02-22: 4 mg via INTRAVENOUS

## 2021-02-22 MED ORDER — METOPROLOL SUCCINATE ER 200 MG PO TB24
200.0000 mg | ORAL_TABLET | Freq: Every day | ORAL | Status: DC
  Administered 2021-02-22: 200 mg via ORAL
  Filled 2021-02-22: qty 1

## 2021-02-22 MED ORDER — ESMOLOL HCL 100 MG/10ML IV SOLN
INTRAVENOUS | Status: DC | PRN
Start: 2021-02-22 — End: 2021-02-22
  Administered 2021-02-22 (×2): 10 mg via INTRAVENOUS

## 2021-02-22 MED ORDER — ROCURONIUM BROMIDE 10 MG/ML (PF) SYRINGE
PREFILLED_SYRINGE | INTRAVENOUS | Status: DC | PRN
Start: 2021-02-22 — End: 2021-02-22
  Administered 2021-02-22: 100 mg via INTRAVENOUS

## 2021-02-22 MED ORDER — PROPOFOL 10 MG/ML IV BOLUS
INTRAVENOUS | Status: DC | PRN
Start: 2021-02-22 — End: 2021-02-22
  Administered 2021-02-22: 50 mg via INTRAVENOUS
  Administered 2021-02-22: 150 mg via INTRAVENOUS

## 2021-02-22 MED ORDER — ZOLPIDEM TARTRATE 5 MG PO TABS: 5.0000 mg | ORAL_TABLET | Freq: Every evening | ORAL | Status: DC | PRN

## 2021-02-22 MED ORDER — LIDOCAINE 2% (20 MG/ML) 5 ML SYRINGE
INTRAMUSCULAR | Status: AC
  Filled 2021-02-22: qty 5

## 2021-02-22 MED ORDER — PROCHLORPERAZINE MALEATE 10 MG PO TABS
10.0000 mg | ORAL_TABLET | Freq: Four times a day (QID) | ORAL | Status: DC | PRN
  Filled 2021-02-22: qty 1

## 2021-02-22 MED ORDER — OXYCODONE HCL 5 MG PO TABS
ORAL_TABLET | ORAL | Status: AC
  Filled 2021-02-22: qty 1

## 2021-02-22 MED ORDER — ACETAMINOPHEN 500 MG PO TABS
ORAL_TABLET | ORAL | Status: AC
  Filled 2021-02-22: qty 2

## 2021-02-22 MED ORDER — INSULIN ASPART 100 UNIT/ML IJ SOLN
INTRAMUSCULAR | Status: AC
  Filled 2021-02-22: qty 1

## 2021-02-22 MED ORDER — BELLADONNA ALKALOIDS-OPIUM 16.2-60 MG RE SUPP
RECTAL | Status: DC | PRN
  Administered 2021-02-22: 1 via RECTAL

## 2021-02-22 MED ORDER — ROCURONIUM BROMIDE 10 MG/ML (PF) SYRINGE
PREFILLED_SYRINGE | INTRAVENOUS | Status: AC
  Filled 2021-02-22: qty 10

## 2021-02-22 MED ORDER — BELLADONNA ALKALOIDS-OPIUM 16.2-60 MG RE SUPP
RECTAL | Status: AC
  Filled 2021-02-22: qty 1

## 2021-02-22 MED ORDER — LACTATED RINGERS IV SOLN: INTRAVENOUS | Status: DC

## 2021-02-22 MED ORDER — MIDAZOLAM HCL 5 MG/5ML IJ SOLN
INTRAMUSCULAR | Status: DC | PRN
  Administered 2021-02-22: 2 mg via INTRAVENOUS

## 2021-02-22 MED ORDER — MIDAZOLAM HCL 2 MG/2ML IJ SOLN
INTRAMUSCULAR | Status: AC
  Filled 2021-02-22: qty 2

## 2021-02-22 MED ORDER — LABETALOL HCL 5 MG/ML IV SOLN
INTRAVENOUS | Status: DC | PRN
  Administered 2021-02-22: 5 mg via INTRAVENOUS

## 2021-02-22 MED ORDER — INSULIN ASPART 100 UNIT/ML IJ SOLN
5.0000 [IU] | Freq: Once | INTRAMUSCULAR | Status: AC
  Administered 2021-02-22: 5 [IU] via SUBCUTANEOUS

## 2021-02-22 MED ORDER — SUGAMMADEX SODIUM 200 MG/2ML IV SOLN: INTRAVENOUS | Status: DC | PRN

## 2021-02-22 MED ORDER — EZETIMIBE 10 MG PO TABS
10.0000 mg | ORAL_TABLET | Freq: Every day | ORAL | Status: DC
  Administered 2021-02-22: 10 mg via ORAL
  Filled 2021-02-22: qty 1

## 2021-02-22 SURGICAL SUPPLY — 23 items
BAG DRAIN URO-CYSTO SKYTR STRL (DRAIN) ×3 IMPLANT
BAG DRN RND TRDRP ANRFLXCHMBR (UROLOGICAL SUPPLIES) ×1
BAG DRN UROCATH (DRAIN) ×1
BAG URINE DRAIN 2000ML AR STRL (UROLOGICAL SUPPLIES) ×1 IMPLANT
CATH FOLEY 2WAY SLVR  5CC 18FR (CATHETERS) ×2
CATH FOLEY 2WAY SLVR 5CC 18FR (CATHETERS) IMPLANT
CLOTH BEACON ORANGE TIMEOUT ST (SAFETY) ×3 IMPLANT
ELECT REM PT RETURN 9FT ADLT (ELECTROSURGICAL) ×2
ELECTRODE REM PT RTRN 9FT ADLT (ELECTROSURGICAL) ×2 IMPLANT
GLOVE SURG ENC MOIS LTX SZ7.5 (GLOVE) ×3 IMPLANT
GLOVE SURG ENC MOIS LTX SZ8 (GLOVE) IMPLANT
GLOVE SURG NEOP MICRO LF SZ6.5 (GLOVE) ×1 IMPLANT
GOWN STRL REUS W/TWL LRG LVL3 (GOWN DISPOSABLE) ×3 IMPLANT
HOLDER FOLEY CATH W/STRAP (MISCELLANEOUS) ×1 IMPLANT
IV NS IRRIG 3000ML ARTHROMATIC (IV SOLUTION) ×5 IMPLANT
KIT TURNOVER CYSTO (KITS) ×3 IMPLANT
LOOP CUT BIPOLAR 24F LRG (ELECTROSURGICAL) ×1 IMPLANT
MANIFOLD NEPTUNE II (INSTRUMENTS) ×3 IMPLANT
PACK CYSTO (CUSTOM PROCEDURE TRAY) ×3 IMPLANT
SYR TOOMEY IRRIG 70ML (MISCELLANEOUS) ×2
SYRINGE TOOMEY IRRIG 70ML (MISCELLANEOUS) IMPLANT
TUBE CONNECTING 12X1/4 (SUCTIONS) ×3 IMPLANT
TUBING UROLOGY SET (TUBING) ×3 IMPLANT

## 2021-02-22 NOTE — Anesthesia Procedure Notes (Signed)
Procedure Name: Intubation Date/Time: 02/22/2021 10:16 AM Performed by: Bonney Aid, CRNA Pre-anesthesia Checklist: Patient identified, Emergency Drugs available, Suction available and Patient being monitored Patient Re-evaluated:Patient Re-evaluated prior to induction Oxygen Delivery Method: Circle system utilized Preoxygenation: Pre-oxygenation with 100% oxygen Induction Type: IV induction Ventilation: Mask ventilation without difficulty Laryngoscope Size: Glidescope and 4 Grade View: Grade I Tube type: Oral Tube size: 7.5 mm Number of attempts: 1 Airway Equipment and Method: Stylet and Oral airway Placement Confirmation: ETT inserted through vocal cords under direct vision, positive ETCO2 and breath sounds checked- equal and bilateral Secured at: 22 cm Tube secured with: Tape Dental Injury: Teeth and Oropharynx as per pre-operative assessment  Comments: Easy intubation with glidescope

## 2021-02-22 NOTE — Progress Notes (Signed)
In room to answer call light.  Found patient standing beside bed with SCDs on, tubing stretched out and attached to machine on end of bed, stating he pressed the call light because the machine was beeping.  Patient has been advised of safety risk of getting OOB without assistance due to having IV, Foley and SCDs on.  Patient has been advised many times to call RN before attempting to get OOB, yet this is the second time he has been found standing at the bedside.  Patient states he will press call light next time before he attempts to get OOB.

## 2021-02-22 NOTE — Anesthesia Preprocedure Evaluation (Addendum)
Anesthesia Evaluation  Patient identified by MRN, date of birth, ID band Patient awake    Reviewed: Allergy & Precautions, NPO status , Patient's Chart, lab work & pertinent test results, reviewed documented beta blocker date and time   Airway Mallampati: IV  TM Distance: >3 FB Neck ROM: Full  Mouth opening: Limited Mouth Opening  Dental  (+) Dental Advisory Given, Chipped, Missing, Poor Dentition,    Pulmonary sleep apnea ,    Pulmonary exam normal breath sounds clear to auscultation       Cardiovascular +CHF  Normal cardiovascular exam+ dysrhythmias Atrial Fibrillation  Rhythm:Regular Rate:Normal  TTE 2021 normal EF, valves ok  Cath 2018 no CAD   Neuro/Psych PSYCHIATRIC DISORDERS Depression negative neurological ROS     GI/Hepatic negative GI ROS, Neg liver ROS,   Endo/Other  diabetes, Type 2, Oral Hypoglycemic Agents  Renal/GU negative Renal ROS  negative genitourinary   Musculoskeletal negative musculoskeletal ROS (+)   Abdominal   Peds  Hematology  (+) Blood dyscrasia (on eliquis), ,   Anesthesia Other Findings   Reproductive/Obstetrics                            Anesthesia Physical Anesthesia Plan  ASA: 3  Anesthesia Plan: General   Post-op Pain Management: Tylenol PO (pre-op)   Induction: Intravenous  PONV Risk Score and Plan: 2 and Midazolam, Dexamethasone and Ondansetron  Airway Management Planned: Oral ETT and Video Laryngoscope Planned  Additional Equipment:   Intra-op Plan:   Post-operative Plan: Extubation in OR  Informed Consent: I have reviewed the patients History and Physical, chart, labs and discussed the procedure including the risks, benefits and alternatives for the proposed anesthesia with the patient or authorized representative who has indicated his/her understanding and acceptance.     Dental advisory given  Plan Discussed with: CRNA  Anesthesia  Plan Comments:        Anesthesia Quick Evaluation

## 2021-02-22 NOTE — H&P (Signed)
H&P  Chief Complaint: Bladder cancer  History of Present Illness:   Darius [name] has a history of gross hematuria and was found to have high-grade T2 of the bladder on TUR November 2022 but he also had other patches of right bladder neck high-grade CIS, right wall high-grade T1, superior bladder toward the dome high-grade T1 and left bladder wall high-grade CIS.  He saw Dr. Alen Blew and will begin chemotherapy and radiation to the bladder for the T2 disease but needs maximal TUR to debulk the system further.  He has been well.  He has had no dysuria or gross hematuria.  He stopped the Eliquis.  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  Had a 2.5 cm right wall tumor completely resected which was T2 and then large patches on the bladder neck, right bladder wall, superior, left bladder wall with residual disease.  He presents to review path. He was never a smoker but his parents smoked.    Biopsy/TUR: November 2022-TUR with gemcitabine - HG T2 right, right BN HG CIS, right wall HG T1,  superior HG T1, left bladder wall HG CIS   Staging: Aug 2022 CT abd and pelvis 08/22 - a 2.5 cm right posterior bladder mass. No LAD or bone lesions.    AUASS = 14. Urgency and nocturia. Of note, he has a sleep study pending from pulmonology.  He has obesity. His Cr was 1.01 Nov 2020.  He did say now that he has healed up from the TUR BT some of the frequency urgency has improved.   He has a history of A. fib and an atrial thrombus and is on Eliquis. 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    Past Medical History:  Diagnosis Date   Chronic combined systolic and diastolic heart failure ([place])    followed by cardiologist;  a. Echo 03/27/11: EF 50-55%, inferior hypokinesis, moderate LAE, mild RVE, normal pulmonary pressures. ;   b.  TEE (04/03/11): EF 40-45%,  mild MR moderate LAE, no LAA clot, mild RAE;  c.  Echocardiogram (02/2013): EF 60-65%, Gr 2 DD, mildly dilated Ao root (Ao root dimension 39 mm), MAC, mild LAE, normal RVF   Complication of anesthesia    pt unable to tolerate versed and fentanyl sedation and required general anesthesia with TEE 02/ 2013   Depression    Diabetes mellitus, type 2 ([place])    followed by pcp  (02-21-2021 pt stated does not check blood sugar )   Hx of cardiovascular stress test    Lexiscan Myoview (02/2013): No ischemia or scar, EF 51%, low risk;  cardiac cath 04-21-2016  no CAD, NICM   Malignant neoplasm of urinary bladder ([place]) 12/2020   urologist--- dr Junious Silk;  oncologist-- dr Alen Blew,  high grade muscle invasion   Morbid obesity ([place])    NICM (nonischemic cardiomyopathy) ([place]) 04/2016   followed by cardiology;   dx 03/ 2018 per TEE ef 15%;  TEE 04/ 2018 ef 25%;  TEE 07/ 2018 ef 40%;  echo 12/ 2021 ef 55%   OSA (obstructive sleep apnea)    followed by dr t. turner--  no cpap/ bipap:: previously followed  by dr clance sleep study 02-11-2013  very severe osa w/ AHI 108/hr had used cpap until 2017:   repeat sleep study 02-14-2018 in epic severe osa w/ hypoxemia, AHI 46.3/hr titrated 04-15-2018 used cpap for  awhile   Persistent atrial fibrillation ([place]) 03/2011   cardiologist--   s/p TEE-DCCV (failed);  Amiodarone started   Past Surgical History:  Procedure Laterality Date   CARDIOVERSION  04/01/2011   Procedure: CARDIOVERSION;  Surgeon: Lelon Perla, MD;  Location: [facility];  Service: Cardiovascular;  Laterality: N/A;   CARDIOVERSION  04/03/2011   Procedure: CARDIOVERSION;  Surgeon: Lelon Perla, MD;  Location: [facility] ENDOSCOPY;  Service: Cardiovascular;  Laterality: N/A;   CARDIOVERSION  04/03/2011   Procedure: CARDIOVERSION;  Surgeon: Lelon Perla, MD;  Location: [place];  Service: Cardiovascular;  Laterality: N/A;   CARDIOVERSION N/A 06/15/2014   Procedure: CARDIOVERSION;  Surgeon: [name], MD;   Location: [place];  Service: Cardiovascular;  Laterality: N/A;   CARDIOVERSION N/A 09/08/2016   Procedure: CARDIOVERSION;  Surgeon: [name], MD;  Location: [facility] ENDOSCOPY;  Service: Cardiovascular;  Laterality: N/A;   I & D EXTREMITY Right 09/19/2015   Procedure: IRRIGATION AND DEBRIDEMENT EXTREMITY;  Surgeon: Mcarthur Rossetti, MD;  Location: [place];  Service: Orthopedics;  Laterality: Right;   RIGHT/LEFT HEART CATH AND CORONARY ANGIOGRAPHY N/A 04/21/2016   Procedure: Right/Left Heart Cath and Coronary Angiography;  Surgeon: [name], MD;  Location: [place] CV LAB;  Service: Cardiovascular;  Laterality: N/A;   TEE WITHOUT CARDIOVERSION  04/01/2011   Procedure: TRANSESOPHAGEAL ECHOCARDIOGRAM (TEE);  Surgeon: Lelon Perla, MD;  Location: [facility] ENDOSCOPY;  Service: Cardiovascular;  Laterality: N/A;   TEE WITHOUT CARDIOVERSION  04/03/2011   Procedure: TRANSESOPHAGEAL ECHOCARDIOGRAM (TEE);  Surgeon: Lelon Perla, MD;  Location: [facility] ENDOSCOPY;  Service: Cardiovascular;  Laterality: N/A;   TEE WITHOUT CARDIOVERSION  04/03/2011   Procedure: TRANSESOPHAGEAL ECHOCARDIOGRAM (TEE);  Surgeon: Lelon Perla, MD;  Location: [facility];  Service: Cardiovascular;  Laterality: N/A;   TEE WITHOUT CARDIOVERSION N/A 04/24/2016   Procedure: TRANSESOPHAGEAL ECHOCARDIOGRAM (TEE);  Surgeon: [name], MD;  Location: [place];  Service: Cardiovascular;  Laterality: N/A;   TEE WITHOUT CARDIOVERSION N/A 05/26/2016   Procedure: TRANSESOPHAGEAL ECHOCARDIOGRAM (TEE);  Surgeon: [name], MD;  Location: [place];  Service: Cardiovascular;  Laterality: N/A;   TEE WITHOUT CARDIOVERSION N/A 09/08/2016   Procedure: TRANSESOPHAGEAL ECHOCARDIOGRAM (TEE);  Surgeon: [name], MD;  Location: [facility] ENDOSCOPY;  Service: Cardiovascular;  Laterality: N/A;   TRANSURETHRAL RESECTION OF BLADDER TUMOR N/A 12/14/2020   Procedure: TRANSURETHRAL RESECTION OF BLADDER TUMOR (TURBT) 2-5 cm/ POST OPERTIVE  INSTILLATION OF GEMCITABINE;  Surgeon: Festus Aloe, MD;  Location: [facility];  Service: Urology;  Laterality: N/A;    Home Medications:  Medications Prior to Admission  Medication Sig Dispense Refill Last Dose   apixaban (ELIQUIS) 5 MG TABS tablet TAKE 1 TABLET (5 MG TOTAL) BY MOUTH 2 (TWO) TIMES DAILY. 60 tablet 0 02/20/2021   atorvastatin (LIPITOR) 80 MG tablet TAKE 1 TABLET BY MOUTH AT BEDTIME. 30 tablet 6 02/21/2021 at 2330   ezetimibe (ZETIA) 10 MG tablet Take 1 tablet (10 mg total) by mouth daily. 90 tablet 3 02/21/2021 at 2330   metFORMIN (GLUCOPHAGE) 1000 MG tablet Take 1 tablet (1,000 mg total) by mouth 2 (two) times daily with food. 60 tablet 1 02/20/2021   metoprolol (TOPROL-XL) 200 MG 24 hr tablet Take 1 tablet (200 mg total) by mouth daily. Take with or immediately following a meal. (Patient taking differently: Take 200 mg by mouth daily. Take with or immediately following a meal.) 90 tablet 1 02/21/2021   oxyCODONE (OXY IR/ROXICODONE) 5 MG immediate  release tablet Take 1 tablet  by mouth every 4 hours as needed for moderate pain. 30 tablet 0 02/21/2021   sacubitril-valsartan (ENTRESTO) 97-103 MG Take 1 tablet by mouth 2 (two) times daily. (Patient taking differently: Take 1 tablet by mouth 2 (two) times daily.) 60 tablet 3    spironolactone (ALDACTONE) 25 MG tablet Take 1 tablet (25 mg total) by mouth daily. 40 tablet 0 02/21/2021 at 2330   prochlorperazine (COMPAZINE) 10 MG tablet Take 1 tablet (10 mg total) by mouth every 6 (six) hours as needed for nausea or vomiting. (Patient not taking: Reported on 02/21/2021) 30 tablet 0 Not Taking   Allergies: No Known Allergies  Family History  Problem Relation Age of Onset   Diabetes Father        died in his 46s   Hypertension Father    Seizures Mother        died in her 26s   Hypertension Sister    Hypertension Paternal Grandfather    Heart attack Neg Hx    Stroke Neg Hx    Social History:  reports that he has  never smoked. He has never used smokeless tobacco. He reports current alcohol use. He reports that he does not currently use drugs.  ROS: A complete review of systems was performed.  All systems are negative except for pertinent findings as noted. Review of Systems  All other systems reviewed and are negative.   Physical Exam:  Vital signs in last 24 hours: Temp:  [97.9 F (36.6 C)] 97.9 F (36.6 C) (01/13 0856) Pulse Rate:  [96] 96 (01/13 0856) Resp:  [18] 18 (01/13 0856) BP: (122)/(72) 122/72 (01/13 0856) SpO2:  [98 %] 98 % (01/13 0856) Weight:  [133.9 kg-137.2 kg] 133.9 kg (01/13 0856) General:  Alert and oriented, No acute distress HEENT: Normocephalic, atraumatic Cardiovascular: Regular rate and rhythm Lungs: Regular rate and effort Abdomen: Soft, nontender, nondistended, no abdominal masses Back: No CVA tenderness Extremities: No edema Neurologic: Grossly intact  Laboratory Data:  Results for orders placed or performed during the hospital encounter of 02/22/21 (from the past 24 hour(s))  I-STAT, chem 8     Status: Abnormal   Collection Time: 02/22/21  9:23 AM  Result Value Ref Range   Sodium 133 (L) 135 - 145 mmol/L   Potassium 4.3 3.5 - 5.1 mmol/L   Chloride 101 98 - 111 mmol/L   BUN 23 8 - 23 mg/dL   Creatinine, Ser 1.50 (H) 0.61 - 1.24 mg/dL   Glucose, Bld 344 (H) 70 - 99 mg/dL   Calcium, Ion 1.09 (L) 1.15 - 1.40 mmol/L   TCO2 23 22 - 32 mmol/L   Hemoglobin 17.3 (H) 13.0 - 17.0 g/dL   HCT 51.0 39.0 - 52.0 %   No results found for this or any previous visit (from the past 240 hour(s)). Creatinine: Recent Labs    02/22/21 0923  CREATININE 1.50*    Impression/Assessment:  High-grade T2 bladder cancer with other areas of high-grade T1 and CIS.  Patient needs maximal TUR prior to beginning chemo and radiation and try modal bladder preservation treatment-  Plan:  I discussed with the patient the nature, potential benefits, risks and alternatives to TURBT,  including side effects of the proposed treatment, the likelihood of the patient achieving the goals of the procedure, and any potential problems that might occur during the procedure or recuperation. All questions answered. Patient elects to proceed.  Discussed he may need a postoperative Foley for a  prolonged period of time.   Festus Aloe 02/22/2021, 9:27 AM

## 2021-02-22 NOTE — Transfer of Care (Signed)
Immediate Anesthesia Transfer of Care Note  Patient: Darius [name]  Procedure(s) Performed: TRANSURETHRAL RESECTION OF BLADDER TUMOR (TURBT) (Bladder)  Patient Location: PACU  Anesthesia Type:General  Level of Consciousness: pateint uncooperative  On arrival, pulled IV.  PIV restarted and Fentanly and precedex given  Airway & Oxygen Therapy: Patient connected to face mask oxygen  Post-op Assessment: Report given to RN  Post vital signs: Reviewed and stable  Last Vitals:  Vitals Value Taken Time  BP 131/75   Temp    Pulse 80 02/22/21 1121  Resp 21 02/22/21 1129  SpO2 99 % 02/22/21 1121  Vitals shown include unvalidated device data.  Last Pain:  Vitals:   02/22/21 0856  TempSrc: Oral  PainSc: 0-No pain      Patients Stated Pain Goal: 5 (68/37/29 0211)  Complications: No notable events documented.

## 2021-02-22 NOTE — Op Note (Signed)
Preoperative diagnosis: Bladder cancer Postoperative diagnosis: Bladder cancer  Procedure: TURBT 2 to 5 cm  Surgeon: Junious Silk  Anesthesia: General  Indication for procedure: Lenis is a 66 year old male with a history of high-grade T2 bladder cancer as well as other patches of high-grade T1 and CIS.  He is brought today for max TUR prior to beginning chemo and radiation to the bladder.  Findings: Patches of erythematous and edematous mucosa right bladder neck which just about wrapped around to the left bladder neck, right lateral, posterior superior and left lateral.  All resected and I was pleased with the resection.  No concern for perforation.  No residual disease.  Description of procedure: After consent was obtained patient brought to the operating room.  After adequate anesthesia he was placed lithotomy position and prepped and draped in the usual sterile fashion.  Timeout was performed confirm the patient and procedure.  Continuous-flow sheath with the visual obturator was passed per urethra and the bladder carefully inspected.  I was able to get a good look at the bladder neck and entire bladder with the continuous-flow sheath.  Then swapped that out for the handle and loop.  I resected the right lateral wall, posterior superior patch, left lateral wall, right bladder neck which included the prior T2 lesion and left bladder neck.  I was pleased with the resection.  Hemostasis was excellent under low pressure.  I sent all the areas separately.  The right, superior and left groups were all about 3 to 4 cm.  The bladder neck area sort of wrapped around from 10:00 to 5:00.  The scope was removed.  8 French Foley catheter placed left to gravity drainage.  Urine was clear.  I did an exam under anesthesia and placed a BNO suppository.  He was awakened taken the cover room in stable condition.  Complications: None  Blood loss: Minimal  Specimens to pathology: #1 right bladder wall #2 posterior  superior #3 left bladder wall #4 right bladder neck #5 left bladder neck  Drains: 18 French Foley catheter  Disposition: Patient will be admitted to extended stay for overnight observation.

## 2021-02-23 ENCOUNTER — Encounter: Payer: Self-pay | Admitting: Oncology

## 2021-02-23 ENCOUNTER — Other Ambulatory Visit (HOSPITAL_COMMUNITY): Payer: Self-pay

## 2021-02-23 DIAGNOSIS — C679 Malignant neoplasm of bladder, unspecified: Secondary | ICD-10-CM | POA: Diagnosis not present

## 2021-02-23 LAB — GLUCOSE, CAPILLARY: Glucose-Capillary: 230 mg/dL — ABNORMAL HIGH (ref 70–99)

## 2021-02-23 MED ORDER — TAMSULOSIN HCL 0.4 MG PO CAPS
0.4000 mg | ORAL_CAPSULE | Freq: Every day | ORAL | 3 refills | Status: DC
  Filled 2021-02-23: qty 30, 30d supply, fill #0
  Filled 2021-05-01: qty 30, 30d supply, fill #1

## 2021-02-23 MED ORDER — OXYCODONE HCL 5 MG PO TABS
ORAL_TABLET | ORAL | Status: AC
  Filled 2021-02-23: qty 1

## 2021-02-23 MED ORDER — APIXABAN 5 MG PO TABS: ORAL_TABLET | Freq: Two times a day (BID) | ORAL | 0 refills | Status: DC

## 2021-02-23 MED ORDER — OXYBUTYNIN CHLORIDE 5 MG PO TABS
ORAL_TABLET | ORAL | Status: AC
  Filled 2021-02-23: qty 1

## 2021-02-23 MED ORDER — OXYBUTYNIN CHLORIDE 5 MG PO TABS
5.0000 mg | ORAL_TABLET | Freq: Two times a day (BID) | ORAL | 0 refills | Status: DC | PRN
  Filled 2021-02-23: qty 10, 5d supply, fill #0

## 2021-02-25 ENCOUNTER — Encounter (HOSPITAL_BASED_OUTPATIENT_CLINIC_OR_DEPARTMENT_OTHER): Payer: Self-pay | Admitting: Urology

## 2021-02-25 LAB — SURGICAL PATHOLOGY

## 2021-02-25 NOTE — Discharge Summary (Signed)
Physician Discharge Summary  Patient ID: Darius [name] MRN: [id number] DOB/AGE: 1956/01/20 66 y.o.  Admit date: 02/22/2021 Discharge date: 02/25/2021  Admission Diagnoses:  Discharge Diagnoses:  Principal Problem:   Bladder cancer [facility])   Discharged Condition: good  Hospital Course: Darius [name] was admitted after TURBT for 23-hour obs for social reasons.  He did well.  His catheter was removed on postop day 1 in the morning and he voided without difficulty.  Consults: None  Significant Diagnostic Studies: none  Treatments: surgery: TURBT  Discharge Exam: Blood pressure 111/64, pulse 81, temperature 97.7 F (36.5 C), resp. rate 16, height 6\' 2"  (1.88 m), weight 133.9 kg, SpO2 96 %. He looks well in the bed, watching TV Cardiovascular-irregular but rate controlled-not tachycardic Respiratory-regular effort and depth Abdomen-soft and nontender Extremity-no calf pain or swelling  Disposition: Discharge disposition: 01-Home or Self Care       Discharge Instructions     Discharge patient   Complete by: As directed    Patient should void prior to discharge   Discharge disposition: 01-Home or Self Care   Discharge patient date: 02/23/2021      Allergies as of 02/23/2021   No Known Allergies      Medication List     TAKE these medications    apixaban 5 MG Tabs tablet Commonly known as: ELIQUIS TAKE 1 TABLET (5 MG TOTAL) BY MOUTH 2 (TWO) TIMES DAILY.   atorvastatin 80 MG tablet Commonly known as: LIPITOR TAKE 1 TABLET BY MOUTH AT BEDTIME.   Entresto 97-103 MG Generic drug: sacubitril-valsartan Take 1 tablet by mouth 2 (two) times daily.   ezetimibe 10 MG tablet Commonly known as: ZETIA Take 1 tablet (10 mg total) by mouth daily.   metFORMIN 1000 MG tablet Commonly known as: GLUCOPHAGE Take 1 tablet (1,000 mg total) by mouth 2 (two) times daily with food.   metoprolol 200 MG 24 hr tablet Commonly known as: TOPROL-XL Take 1 tablet (200 mg total) by  mouth daily. Take with or immediately following a meal.   oxybutynin 5 MG tablet Commonly known as: DITROPAN Take 1 tablet (5 mg total) by mouth 2 (two) times daily as needed for bladder spasms. Notes to patient: Can take at 10 am and 10 pm if you need to take for bladder spasms.   oxyCODONE 5 MG immediate release tablet Commonly known as: Oxy IR/ROXICODONE Take 1 tablet  by mouth every 4 hours as needed for moderate pain.   prochlorperazine 10 MG tablet Commonly known as: COMPAZINE Take 1 tablet (10 mg total) by mouth every 6 (six) hours as needed for nausea or vomiting.   spironolactone 25 MG tablet Commonly known as: ALDACTONE Take 1 tablet (25 mg total) by mouth daily.   tamsulosin 0.4 MG Caps capsule Commonly known as: FLOMAX Take 1 capsule (0.4 mg total) by mouth daily after supper.        Follow-up Information     Festus Aloe, MD Follow up.   Specialty: Urology Why: Office will call with appointment for you to be seen around April 2023. Contact information: [place] [address] [phone]                 Signed: Festus Aloe 02/25/2021, 12:54 PM

## 2021-02-26 ENCOUNTER — Other Ambulatory Visit: Payer: Medicare Other

## 2021-02-26 ENCOUNTER — Ambulatory Visit: Payer: Medicare Other

## 2021-03-03 NOTE — Anesthesia Postprocedure Evaluation (Signed)
Anesthesia Post Note  Patient: Darius [name]  Procedure(s) Performed: TRANSURETHRAL RESECTION OF BLADDER TUMOR (TURBT) (Bladder)     Patient location during evaluation: PACU Anesthesia Type: General Level of consciousness: awake and alert Pain management: pain level controlled Vital Signs Assessment: post-procedure vital signs reviewed and stable Respiratory status: spontaneous breathing, nonlabored ventilation, respiratory function stable and patient connected to nasal cannula oxygen Cardiovascular status: blood pressure returned to baseline and stable Postop Assessment: no apparent nausea or vomiting Anesthetic complications: no   No notable events documented.  Last Vitals:  Vitals:   02/23/21 0515 02/23/21 0830  BP: 124/67 111/64  Pulse: 88 81  Resp: 18 16  Temp: 36.4 C 36.5 C  SpO2: 95% 96%    Last Pain:  Vitals:   02/25/21 1003  TempSrc:   PainSc: 0-No pain                 Geniya Fulgham L Kristell Wooding

## 2021-03-04 ENCOUNTER — Ambulatory Visit
Admission: RE | Admit: 2021-03-04 | Discharge: 2021-03-04 | Disposition: A | Discharge status: Home / Self Care | Payer: 59 | Source: Ambulatory Visit | Attending: Radiation Oncology | Admitting: Radiation Oncology

## 2021-03-04 ENCOUNTER — Other Ambulatory Visit: Payer: Self-pay

## 2021-03-04 DIAGNOSIS — Z510 Encounter for antineoplastic radiation therapy: Secondary | ICD-10-CM | POA: Insufficient documentation

## 2021-03-04 DIAGNOSIS — C672 Malignant neoplasm of lateral wall of bladder: Secondary | ICD-10-CM

## 2021-03-04 DIAGNOSIS — Z5111 Encounter for antineoplastic chemotherapy: Secondary | ICD-10-CM | POA: Diagnosis not present

## 2021-03-04 DIAGNOSIS — Z79899 Other long term (current) drug therapy: Secondary | ICD-10-CM | POA: Diagnosis not present

## 2021-03-04 DIAGNOSIS — C679 Malignant neoplasm of bladder, unspecified: Secondary | ICD-10-CM | POA: Insufficient documentation

## 2021-03-04 NOTE — Progress Notes (Signed)
°  Radiation Oncology         [phone] ________________________________  Name: Darius [name] MRN: [id number]  Date: 03/04/2021  DOB: 1955/07/13  SIMULATION AND TREATMENT PLANNING NOTE    ICD-10-CM   1. 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)  C67.2       DIAGNOSIS:  66 y.o. man with high-grade, muscle invasive bladder cancer.  NARRATIVE:  The patient was brought to the [facility].  Identity was confirmed.  All relevant records and images related to the planned course of therapy were reviewed.  The patient freely provided informed written consent to proceed with treatment after reviewing the details related to the planned course of therapy. The consent form was witnessed and verified by the simulation staff.  Then, the patient was set-up in a stable reproducible  supine position for radiation therapy.  Contrast was instilled into the bladder with a catheter under sterile conditions.  CT images were obtained.  Surface markings were placed.  The CT images were loaded into the planning software.  Then the target and avoidance structures were contoured.  Treatment planning then occurred.  The radiation prescription was entered and confirmed.  Then, I designed and supervised the construction of a total of 5 medically necessary complex treatment devices including VacLoc body positioner and 4 MLCs to shield the bowel and femoral necks.  I have requested : 3D Simulation  I have requested a DVH of the following structures: small bowel, rectum, left femoral head, right femoral head and targets.  SPECIAL TREATMENT PROCEDURE:  The planned course of therapy using radiation constitutes a special treatment procedure. Special care is required in the management of this patient for the following reasons. This treatment constitutes a Special Treatment Procedure for the following reason: [ Concurrent chemotherapy requiring careful monitoring for increased toxicities of treatment including  weekly laboratory values..  The special nature of the planned course of radiotherapy will require increased physician supervision and oversight to ensure patient's safety with optimal treatment outcomes.  PLAN:  The patient will receive 19.8 Gy in 11 fractions of 1.8 Gy to the bladder tumor with full bladder, then 45 Gy in 25 fractions to the whole bladder and pelvic nodes to a total dose of 64.8 Gy.  ________________________________  Sheral Apley Tammi Klippel, M.D.

## 2021-03-05 ENCOUNTER — Inpatient Hospital Stay: Payer: 59 | Attending: Oncology

## 2021-03-05 ENCOUNTER — Inpatient Hospital Stay (HOSPITAL_BASED_OUTPATIENT_CLINIC_OR_DEPARTMENT_OTHER): Payer: 59 | Admitting: Oncology

## 2021-03-05 ENCOUNTER — Inpatient Hospital Stay: Payer: 59

## 2021-03-05 VITALS — BP 136/94 | HR 99 | Temp 98.2°F | Resp 19 | Ht 74.0 in | Wt 295.8 lb

## 2021-03-05 DIAGNOSIS — Z5111 Encounter for antineoplastic chemotherapy: Secondary | ICD-10-CM | POA: Diagnosis not present

## 2021-03-05 DIAGNOSIS — C679 Malignant neoplasm of bladder, unspecified: Secondary | ICD-10-CM | POA: Diagnosis not present

## 2021-03-05 DIAGNOSIS — Z79899 Other long term (current) drug therapy: Secondary | ICD-10-CM | POA: Insufficient documentation

## 2021-03-05 LAB — CMP (CANCER CENTER ONLY)
ALT: 24 U/L (ref 0–44)
AST: 13 U/L — ABNORMAL LOW (ref 15–41)
Albumin: 3.9 g/dL (ref 3.5–5.0)
Alkaline Phosphatase: 93 U/L (ref 38–126)
Anion gap: 9 (ref 5–15)
BUN: 17 mg/dL (ref 8–23)
CO2: 24 mmol/L (ref 22–32)
Calcium: 9.5 mg/dL (ref 8.9–10.3)
Chloride: 100 mmol/L (ref 98–111)
Creatinine: 1.22 mg/dL (ref 0.61–1.24)
GFR, Estimated: >60 mL/min (ref >60)
Glucose, Bld: 337 mg/dL — ABNORMAL HIGH (ref 70–99)
Potassium: 4.5 mmol/L (ref 3.5–5.1)
Sodium: 133 mmol/L — ABNORMAL LOW (ref 135–145)
Total Bilirubin: 0.6 mg/dL (ref 0.3–1.2)
Total Protein: 7.3 g/dL (ref 6.5–8.1)

## 2021-03-05 LAB — CBC WITH DIFFERENTIAL (CANCER CENTER ONLY)
Abs Immature Granulocytes: 0.02 10*3/uL (ref 0.00–0.07)
Basophils Absolute: 0.1 10*3/uL (ref 0.0–0.1)
Basophils Relative: 1 %
Eosinophils Absolute: 1.3 10*3/uL — ABNORMAL HIGH (ref 0.0–0.5)
Eosinophils Relative: 14 %
HCT: 47.8 % (ref 39.0–52.0)
Hemoglobin: 16.7 g/dL (ref 13.0–17.0)
Immature Granulocytes: 0 %
Lymphocytes Relative: 28 %
Lymphs Abs: 2.8 10*3/uL (ref 0.7–4.0)
MCH: 30.3 pg (ref 26.0–34.0)
MCHC: 34.9 g/dL (ref 30.0–36.0)
MCV: 86.8 fL (ref 80.0–100.0)
Monocytes Absolute: 0.9 10*3/uL (ref 0.1–1.0)
Monocytes Relative: 9 %
Neutro Abs: 4.7 10*3/uL (ref 1.7–7.7)
Neutrophils Relative %: 48 %
Platelet Count: 223 10*3/uL (ref 150–400)
RBC: 5.51 MIL/uL (ref 4.22–5.81)
RDW: 12.4 % (ref 11.5–15.5)
WBC Count: 9.7 10*3/uL (ref 4.0–10.5)
nRBC: 0 % (ref 0.0–0.2)

## 2021-03-05 NOTE — Progress Notes (Signed)
Hematology and Oncology Follow Up Visit  Darius [name] [id number] 1955/03/23 66 y.o. 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   Prior Therapy:  He is s/p TURBT in November 2022 and repeated in January 2023.  Current therapy: Definitive radiation therapy with weekly cisplatin projected to start on March 11, 2021.  Interim History: Darius [name] returns today for a follow-up visit.  Since the last visit, he reports no major changes in his health.  He underwent a repeat TURBT under the care of Dr. Junious Silk on February 22, 2021.  He tolerated the procedure well without any complications.  He denies any hematuria, dysuria or any complications.  He denies any excessive fatigue, tiredness or weakness.  His performance status quality of life remain excellent.     Medications: I have reviewed the patient's current medications.  Current Outpatient Medications  Medication Sig Dispense Refill   apixaban (ELIQUIS) 5 MG TABS tablet TAKE 1 TABLET (5 MG TOTAL) BY MOUTH 2 (TWO) TIMES DAILY. 60 tablet 0   atorvastatin (LIPITOR) 80 MG tablet TAKE 1 TABLET BY MOUTH AT BEDTIME. 30 tablet 6   ezetimibe (ZETIA) 10 MG tablet Take 1 tablet (10 mg total) by mouth daily. 90 tablet 3   metFORMIN (GLUCOPHAGE) 1000 MG tablet Take 1 tablet (1,000 mg total) by mouth 2 (two) times daily with food. 60 tablet 1   metoprolol (TOPROL-XL) 200 MG 24 hr tablet Take 1 tablet (200 mg total) by mouth daily. Take with or immediately following a meal. (Patient taking differently: Take 200 mg by mouth daily. Take with or immediately following a meal.) 90 tablet 1   oxybutynin (DITROPAN) 5 MG tablet Take 1 tablet (5 mg total) by mouth 2 (two) times daily as needed for bladder spasms. 10 tablet 0   oxyCODONE (OXY IR/ROXICODONE) 5 MG immediate release tablet Take 1 tablet  by mouth every 4 hours as  needed for moderate pain. 30 tablet 0   prochlorperazine (COMPAZINE) 10 MG tablet Take 1 tablet (10 mg total) by mouth every 6 (six) hours as needed for nausea or vomiting. (Patient not taking: Reported on 02/21/2021) 30 tablet 0   sacubitril-valsartan (ENTRESTO) 97-103 MG Take 1 tablet by mouth 2 (two) times daily. (Patient taking differently: Take 1 tablet by mouth 2 (two) times daily.) 60 tablet 3   spironolactone (ALDACTONE) 25 MG tablet Take 1 tablet (25 mg total) by mouth daily. 40 tablet 0   tamsulosin (FLOMAX) 0.4 MG CAPS capsule Take 1 capsule (0.4 mg total) by mouth daily after supper. 30 capsule 3   No current facility-administered medications for this visit.     Allergies: No Known Allergies   Physical Exam: Blood pressure (!) 136/94, pulse 99, temperature 98.2 F (36.8 C), temperature source Temporal, resp. rate 19, height 6\' 2"  (1.88 m), weight 295 lb 12.8 oz (134.2 kg), SpO2 100 %.  ECOG: 1   General appearance: Alert, awake without any distress. Head: Atraumatic without abnormalities Oropharynx: Without any thrush or ulcers. Eyes: No scleral icterus. Lymph nodes: No lymphadenopathy noted in the cervical, supraclavicular, or axillary nodes Heart:regular rate and rhythm, without any murmurs or gallops.   Lung: Clear to auscultation without any rhonchi, wheezes or dullness to percussion. Abdomin: Soft, nontender without any shifting dullness or ascites. Musculoskeletal: No clubbing or cyanosis. Neurological: No motor or sensory deficits. Skin: No rashes or lesions.  Lab Results: Lab Results  Component Value Date   WBC 7.7 10/23/2020   HGB 17.3 (H) 02/22/2021   HCT 51.0 02/22/2021   MCV 88.9 10/23/2020   PLT 192 10/23/2020     Chemistry      Component Value Date/Time   NA 133 (L) 02/22/2021 0923   NA 139 09/10/2020 1428   K 4.3 02/22/2021 0923   CL 101 02/22/2021 0923   CO2 24 10/23/2020 1156   BUN 23 02/22/2021 0923   BUN 16 09/10/2020 1428    CREATININE 1.50 (H) 02/22/2021 0923   CREATININE 1.05 11/12/2016 1051      Component Value Date/Time   CALCIUM 9.3 10/23/2020 1156   ALKPHOS 73 12/24/2020 1126   AST 22 12/24/2020 1126   ALT 45 (H) 12/24/2020 1126   BILITOT 1.2 12/24/2020 1126   BILITOT 0.8 10/20/2019 1006        Impression and Plan:  66 year old with:     1.  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    Treatment options were discussed at this time including radical cystectomy versus bladder preservation therapy with radiation and chemotherapy.  He opted to proceed with radiation concomitantly with chemotherapy.  Complication associated with weekly cisplatin treatment were discussed.  At this time, he is agreeable to proceed.  He is set to start first day of radiation on January 30 and the first cycle of chemotherapy will be on January 31.   2.  IV access: Peripheral veins are currently in use and will defer the option of a Port-A-Cath.   3.  Antiemetics: We have discussed strategies to alleviate nausea at this time.  Prescription for Compazine was made available to him.   4.  Renal function surveillance: Creatinine clearance remains within normal range and will be updated today and monitored on platinum therapy.   5.  Goals of care: His disease is incurable and aggressive measures are warranted at this time.   6.  Follow-up: He will return next week for the start of chemotherapy cycle 1.   30  minutes were spent on this encounter.  Time was dedicated to reviewing laboratory data, disease status update and outlining future plan of care review.          Zola Button, MD 1/24/20238:22 AM

## 2021-03-05 NOTE — Progress Notes (Signed)
Pharmacist Chemotherapy Monitoring - Initial Assessment    Anticipated start date: 03/12/20   The following has been reviewed per standard work regarding the patient's treatment regimen: The patient's diagnosis, treatment plan and drug doses, and organ/hematologic function Lab orders and baseline tests specific to treatment regimen  The treatment plan start date, drug sequencing, and pre-medications Prior authorization status  Patient's documented medication list, including drug-drug interaction screen and prescriptions for anti-emetics and supportive care specific to the treatment regimen The drug concentrations, fluid compatibility, administration routes, and timing of the medications to be used The patient's access for treatment and lifetime cumulative dose history, if applicable  The patient's medication allergies and previous infusion related reactions, if applicable   Changes made to treatment plan:  N/A  Follow up needed:  N/A   Kennith Center, Pharm.D., CPP 03/05/2021@3 :38 PM

## 2021-03-11 ENCOUNTER — Ambulatory Visit: Payer: 59

## 2021-03-11 MED FILL — Fosaprepitant Dimeglumine For IV Infusion 150 MG (Base Eq): INTRAVENOUS | Qty: 5 | Status: AC

## 2021-03-11 MED FILL — Dexamethasone Sodium Phosphate Inj 100 MG/10ML: INTRAMUSCULAR | Qty: 1 | Status: AC

## 2021-03-12 ENCOUNTER — Inpatient Hospital Stay: Payer: 59

## 2021-03-12 ENCOUNTER — Other Ambulatory Visit: Payer: Self-pay

## 2021-03-12 ENCOUNTER — Ambulatory Visit: Payer: 59

## 2021-03-12 ENCOUNTER — Telehealth: Payer: Self-pay | Admitting: *Deleted

## 2021-03-12 VITALS — BP 108/70 | HR 71 | Temp 98.7°F | Resp 18

## 2021-03-12 DIAGNOSIS — C679 Malignant neoplasm of bladder, unspecified: Secondary | ICD-10-CM

## 2021-03-12 DIAGNOSIS — E1100 Type 2 diabetes mellitus with hyperosmolarity without nonketotic hyperglycemic-hyperosmolar coma (NKHHC): Secondary | ICD-10-CM

## 2021-03-12 DIAGNOSIS — Z5111 Encounter for antineoplastic chemotherapy: Secondary | ICD-10-CM | POA: Diagnosis not present

## 2021-03-12 DIAGNOSIS — C672 Malignant neoplasm of lateral wall of bladder: Secondary | ICD-10-CM

## 2021-03-12 LAB — CBC WITH DIFFERENTIAL (CANCER CENTER ONLY)
Abs Immature Granulocytes: 0.01 10*3/uL (ref 0.00–0.07)
Basophils Absolute: 0.1 10*3/uL (ref 0.0–0.1)
Basophils Relative: 1 %
Eosinophils Absolute: 1.1 10*3/uL — ABNORMAL HIGH (ref 0.0–0.5)
Eosinophils Relative: 15 %
HCT: 47.1 % (ref 39.0–52.0)
Hemoglobin: 16.6 g/dL (ref 13.0–17.0)
Immature Granulocytes: 0 %
Lymphocytes Relative: 26 %
Lymphs Abs: 2 10*3/uL (ref 0.7–4.0)
MCH: 30.3 pg (ref 26.0–34.0)
MCHC: 35.2 g/dL (ref 30.0–36.0)
MCV: 85.9 fL (ref 80.0–100.0)
Monocytes Absolute: 0.7 10*3/uL (ref 0.1–1.0)
Monocytes Relative: 9 %
Neutro Abs: 3.9 10*3/uL (ref 1.7–7.7)
Neutrophils Relative %: 49 %
Platelet Count: 204 10*3/uL (ref 150–400)
RBC: 5.48 MIL/uL (ref 4.22–5.81)
RDW: 12.3 % (ref 11.5–15.5)
WBC Count: 7.8 10*3/uL (ref 4.0–10.5)
nRBC: 0 % (ref 0.0–0.2)

## 2021-03-12 LAB — CMP (CANCER CENTER ONLY)
ALT: 21 U/L (ref 0–44)
AST: 12 U/L — ABNORMAL LOW (ref 15–41)
Albumin: 4 g/dL (ref 3.5–5.0)
Alkaline Phosphatase: 104 U/L (ref 38–126)
Anion gap: 9 (ref 5–15)
BUN: 18 mg/dL (ref 8–23)
CO2: 25 mmol/L (ref 22–32)
Calcium: 10 mg/dL (ref 8.9–10.3)
Chloride: 95 mmol/L — ABNORMAL LOW (ref 98–111)
Creatinine: 1.28 mg/dL — ABNORMAL HIGH (ref 0.61–1.24)
GFR, Estimated: >60 mL/min (ref >60)
Glucose, Bld: 598 mg/dL (ref 70–99)
Potassium: 4.9 mmol/L (ref 3.5–5.1)
Sodium: 129 mmol/L — ABNORMAL LOW (ref 135–145)
Total Bilirubin: 0.7 mg/dL (ref 0.3–1.2)
Total Protein: 7.4 g/dL (ref 6.5–8.1)

## 2021-03-12 LAB — GLUCOSE, CAPILLARY
Glucose-Capillary: 457 mg/dL — ABNORMAL HIGH (ref 70–99)
Glucose-Capillary: 397 mg/dL — ABNORMAL HIGH (ref 70–99)

## 2021-03-12 MED ORDER — SODIUM CHLORIDE 0.9 % IV SOLN: Freq: Once | INTRAVENOUS | Status: AC

## 2021-03-12 MED ORDER — PALONOSETRON HCL INJECTION 0.25 MG/5ML
0.2500 mg | Freq: Once | INTRAVENOUS | Status: AC
  Administered 2021-03-12: 0.25 mg via INTRAVENOUS
  Filled 2021-03-12: qty 5

## 2021-03-12 MED ORDER — SODIUM CHLORIDE 0.9 % IV SOLN
35.0000 mg/m2 | Freq: Once | INTRAVENOUS | Status: AC
  Administered 2021-03-12: 94 mg via INTRAVENOUS
  Filled 2021-03-12: qty 94

## 2021-03-12 MED ORDER — INSULIN ASPART 100 UNIT/ML IJ SOLN
10.0000 [IU] | Freq: Once | INTRAMUSCULAR | Status: AC
  Administered 2021-03-12: 10 [IU] via SUBCUTANEOUS
  Filled 2021-03-12: qty 1

## 2021-03-12 MED ORDER — MAGNESIUM SULFATE 2 GM/50ML IV SOLN
2.0000 g | Freq: Once | INTRAVENOUS | Status: AC
  Administered 2021-03-12: 2 g via INTRAVENOUS
  Filled 2021-03-12: qty 50

## 2021-03-12 MED ORDER — POTASSIUM CHLORIDE IN NACL 20-0.9 MEQ/L-% IV SOLN
Freq: Once | INTRAVENOUS | Status: AC
  Filled 2021-03-12: qty 1000

## 2021-03-12 MED ORDER — SODIUM CHLORIDE 0.9 % IV SOLN
150.0000 mg | Freq: Once | INTRAVENOUS | Status: AC
  Administered 2021-03-12: 150 mg via INTRAVENOUS
  Filled 2021-03-12: qty 150

## 2021-03-12 NOTE — Patient Instructions (Signed)
[facility]   Discharge Instructions: Thank you for choosing [facility] to provide your oncology and hematology care.   If you have a lab appointment with the Cancer Center, please go directly to the Cancer Center and check in at the registration area.   Wear comfortable clothing and clothing appropriate for easy access to any Portacath or PICC line.   We strive to give you quality time with your provider. You may need to reschedule your appointment if you arrive late (15 or more minutes).  Arriving late affects you and other patients whose appointments are after yours.  Also, if you miss three or more appointments without notifying the office, you may be dismissed from the clinic at the provider's discretion.      For prescription refill requests, have your pharmacy contact our office and allow 72 hours for refills to be completed.    Today you received the following chemotherapy and/or immunotherapy agents: cisplatin      To help prevent nausea and vomiting after your treatment, we encourage you to take your nausea medication as directed.  BELOW ARE SYMPTOMS THAT SHOULD BE REPORTED IMMEDIATELY: *FEVER GREATER THAN 100.4 F (38 C) OR HIGHER *CHILLS OR SWEATING *NAUSEA AND VOMITING THAT IS NOT CONTROLLED WITH YOUR NAUSEA MEDICATION *UNUSUAL SHORTNESS OF BREATH *UNUSUAL BRUISING OR BLEEDING *URINARY PROBLEMS (pain or burning when urinating, or frequent urination) *BOWEL PROBLEMS (unusual diarrhea, constipation, pain near the anus) TENDERNESS IN MOUTH AND THROAT WITH OR WITHOUT PRESENCE OF ULCERS (sore throat, sores in mouth, or a toothache) UNUSUAL RASH, SWELLING OR PAIN  UNUSUAL VAGINAL DISCHARGE OR ITCHING   Items with * indicate a potential emergency and should be followed up as soon as possible or go to the Emergency Department if any problems should occur.  Please show the CHEMOTHERAPY ALERT CARD or IMMUNOTHERAPY ALERT CARD at check-in  to the Emergency Department and triage nurse.  Should you have questions after your visit or need to cancel or reschedule your appointment, please contact [facility]  Dept: [phone]  and follow the prompts.  Office hours are 8:00 a.m. to 4:30 p.m. Monday - Friday. Please note that voicemails left after 4:00 p.m. may not be returned until the following business day.  We are closed weekends and major holidays. You have access to a nurse at all times for urgent questions. Please call the main number to the clinic Dept: [phone] and follow the prompts.   For any non-urgent questions, you may also contact your provider using MyChart. We now offer e-Visits for anyone 18 and older to request care online for non-urgent symptoms. For details visit [email or web address].   Also download the MyChart app! Go to the app store, search "MyChart", open the app, select [facility], and log in with your MyChart username and password.  Due to Covid, a mask is required upon entering the hospital/clinic. If you do not have a mask, one will be given to you upon arrival. For doctor visits, patients may have 1 support person aged 18 or older with them. For treatment visits, patients cannot have anyone with them due to current Covid guidelines and our immunocompromised population.   Cisplatin injection What is this medication? CISPLATIN (SIS pla tin) is a chemotherapy drug. It targets fast dividing cells, like cancer cells, and causes these cells to die. This medicine is used to treat many types of cancer like bladder, ovarian, and testicular cancers. This medicine may be   used for other purposes; ask your health care provider or pharmacist if you have questions. COMMON BRAND NAME(S): Platinol, Platinol -AQ What should I tell my care team before I take this medication? They need to know if you have any of these conditions: eye disease, vision problems hearing problems kidney  disease low blood counts, like white cells, platelets, or red blood cells tingling of the fingers or toes, or other nerve disorder an unusual or allergic reaction to cisplatin, carboplatin, oxaliplatin, other medicines, foods, dyes, or preservatives pregnant or trying to get pregnant breast-feeding How should I use this medication? This drug is given as an infusion into a vein. It is administered in a hospital or clinic by a specially trained health care professional. Talk to your pediatrician regarding the use of this medicine in children. Special care may be needed. Overdosage: If you think you have taken too much of this medicine contact a poison control center or emergency room at once. NOTE: This medicine is only for you. Do not share this medicine with others. What if I miss a dose? It is important not to miss a dose. Call your doctor or health care professional if you are unable to keep an appointment. What may interact with this medication? This medicine may interact with the following medications: foscarnet certain antibiotics like amikacin, gentamicin, neomycin, polymyxin B, streptomycin, tobramycin, vancomycin This list may not describe all possible interactions. Give your health care provider a list of all the medicines, herbs, non-prescription drugs, or dietary supplements you use. Also tell them if you smoke, drink alcohol, or use illegal drugs. Some items may interact with your medicine. What should I watch for while using this medication? Your condition will be monitored carefully while you are receiving this medicine. You will need important blood work done while you are taking this medicine. This drug may make you feel generally unwell. This is not uncommon, as chemotherapy can affect healthy cells as well as cancer cells. Report any side effects. Continue your course of treatment even though you feel ill unless your doctor tells you to stop. This medicine may increase your  risk of getting an infection. Call your healthcare professional for advice if you get a fever, chills, or sore throat, or other symptoms of a cold or flu. Do not treat yourself. Try to avoid being around people who are sick. Avoid taking medicines that contain aspirin, acetaminophen, ibuprofen, naproxen, or ketoprofen unless instructed by your healthcare professional. These medicines may hide a fever. This medicine may increase your risk to bruise or bleed. Call your doctor or health care professional if you notice any unusual bleeding. Be careful brushing and flossing your teeth or using a toothpick because you may get an infection or bleed more easily. If you have any dental work done, tell your dentist you are receiving this medicine. Do not become pregnant while taking this medicine or for 14 months after stopping it. Women should inform their healthcare professional if they wish to become pregnant or think they might be pregnant. Men should not father a child while taking this medicine and for 11 months after stopping it. There is potential for serious side effects to an unborn child. Talk to your healthcare professional for more information. Do not breast-feed an infant while taking this medicine. This medicine has caused ovarian failure in some women. This medicine may make it more difficult to get pregnant. Talk to your healthcare professional if you are concerned about your fertility. This medicine has   caused decreased sperm counts in some men. This may make it more difficult to father a child. Talk to your healthcare professional if you are concerned about your fertility. Drink fluids as directed while you are taking this medicine. This will help protect your kidneys. Call your doctor or health care professional if you get diarrhea. Do not treat yourself. What side effects may I notice from receiving this medication? Side effects that you should report to your doctor or health care professional  as soon as possible: allergic reactions like skin rash, itching or hives, swelling of the face, lips, or tongue blurred vision changes in vision decreased hearing or ringing of the ears nausea, vomiting pain, redness, or irritation at site where injected pain, tingling, numbness in the hands or feet signs and symptoms of bleeding such as bloody or black, tarry stools; red or dark brown urine; spitting up blood or brown material that looks like coffee grounds; red spots on the skin; unusual bruising or bleeding from the eyes, gums, or nose signs and symptoms of infection like fever; chills; cough; sore throat; pain or trouble passing urine signs and symptoms of kidney injury like trouble passing urine or change in the amount of urine signs and symptoms of low red blood cells or anemia such as unusually weak or tired; feeling faint or lightheaded; falls; breathing problems Side effects that usually do not require medical attention (report to your doctor or health care professional if they continue or are bothersome): loss of appetite mouth sores muscle cramps This list may not describe all possible side effects. Call your doctor for medical advice about side effects. You may report side effects to FDA at 1-800-FDA-1088. Where should I keep my medication? This drug is given in a hospital or clinic and will not be stored at home. NOTE: This sheet is a summary. It may not cover all possible information. If you have questions about this medicine, talk to your doctor, pharmacist, or health care provider.  2022 [employer]vier/Gold Standard (2020-10-16 00:00:00)  

## 2021-03-12 NOTE — Telephone Encounter (Signed)
Darius [name] from lab called with critical glucose 598. Per Dr.Shadad, pt is to receive 10 units R. Hold dex and proceed with treatment. Infusion nurse to advise pt to call PCP. Also OK to treat with no magnesium lab results. [name], RN made aware.

## 2021-03-12 NOTE — Progress Notes (Signed)
Ok to proceed without mag level today per Dr Alen Blew. Hold Dexamethasone today due to elevated blood glucose per Dr. Alen Blew  FSBS @ 1135 is 457. MD notified.  FSBS @ 3606 is 397. MD notified.

## 2021-03-13 ENCOUNTER — Telehealth: Payer: Self-pay

## 2021-03-13 ENCOUNTER — Ambulatory Visit: Payer: 59

## 2021-03-13 NOTE — Telephone Encounter (Signed)
-----   Message from Clyda Hurdle, RN sent at 03/12/2021  2:27 PM EST ----- Regarding: 1st Time Cisplatin-Dr Shadad's patient 1st Time Cisplatin Dr Hazeline Junker patient

## 2021-03-13 NOTE — Telephone Encounter (Signed)
LM for patient stating that this nurse was calling to see how he was doing after his treatment yesterday. Hope he was doing fine.  If he is experiencing any issues from his treatment yesterday, he can call the cancer center at [phone] and speak with Dr. Hazeline Junker nurse.

## 2021-03-14 ENCOUNTER — Other Ambulatory Visit: Payer: Self-pay

## 2021-03-14 ENCOUNTER — Ambulatory Visit
Admission: RE | Admit: 2021-03-14 | Discharge: 2021-03-14 | Disposition: A | Discharge status: Home / Self Care | Payer: 59 | Source: Ambulatory Visit | Attending: Radiation Oncology | Admitting: Radiation Oncology

## 2021-03-14 DIAGNOSIS — Z510 Encounter for antineoplastic radiation therapy: Secondary | ICD-10-CM | POA: Insufficient documentation

## 2021-03-14 DIAGNOSIS — C672 Malignant neoplasm of lateral wall of bladder: Secondary | ICD-10-CM | POA: Diagnosis present

## 2021-03-15 ENCOUNTER — Ambulatory Visit
Admission: RE | Admit: 2021-03-15 | Discharge: 2021-03-15 | Disposition: A | Discharge status: Home / Self Care | Payer: 59 | Source: Ambulatory Visit | Attending: Radiation Oncology | Admitting: Radiation Oncology

## 2021-03-15 DIAGNOSIS — Z510 Encounter for antineoplastic radiation therapy: Secondary | ICD-10-CM | POA: Diagnosis not present

## 2021-03-18 ENCOUNTER — Ambulatory Visit
Admission: RE | Admit: 2021-03-18 | Discharge: 2021-03-18 | Disposition: A | Discharge status: Home / Self Care | Payer: 59 | Source: Ambulatory Visit | Attending: Radiation Oncology | Admitting: Radiation Oncology

## 2021-03-18 ENCOUNTER — Other Ambulatory Visit: Payer: Self-pay

## 2021-03-18 DIAGNOSIS — Z79899 Other long term (current) drug therapy: Secondary | ICD-10-CM | POA: Insufficient documentation

## 2021-03-18 DIAGNOSIS — Z510 Encounter for antineoplastic radiation therapy: Secondary | ICD-10-CM | POA: Diagnosis not present

## 2021-03-18 DIAGNOSIS — C672 Malignant neoplasm of lateral wall of bladder: Secondary | ICD-10-CM | POA: Insufficient documentation

## 2021-03-18 DIAGNOSIS — Z5111 Encounter for antineoplastic chemotherapy: Secondary | ICD-10-CM | POA: Insufficient documentation

## 2021-03-18 DIAGNOSIS — E1165 Type 2 diabetes mellitus with hyperglycemia: Secondary | ICD-10-CM | POA: Insufficient documentation

## 2021-03-18 MED FILL — Fosaprepitant Dimeglumine For IV Infusion 150 MG (Base Eq): INTRAVENOUS | Qty: 5 | Status: AC

## 2021-03-18 MED FILL — Dexamethasone Sodium Phosphate Inj 100 MG/10ML: INTRAMUSCULAR | Qty: 1 | Status: AC

## 2021-03-19 ENCOUNTER — Ambulatory Visit
Admission: RE | Admit: 2021-03-19 | Discharge: 2021-03-19 | Disposition: A | Discharge status: Home / Self Care | Payer: 59 | Source: Ambulatory Visit | Attending: Radiation Oncology | Admitting: Radiation Oncology

## 2021-03-19 ENCOUNTER — Other Ambulatory Visit (HOSPITAL_COMMUNITY): Payer: Self-pay

## 2021-03-19 ENCOUNTER — Inpatient Hospital Stay: Payer: 59

## 2021-03-19 VITALS — BP 108/60 | HR 55 | Temp 98.0°F | Resp 20 | Wt 288.0 lb

## 2021-03-19 DIAGNOSIS — Z510 Encounter for antineoplastic radiation therapy: Secondary | ICD-10-CM | POA: Diagnosis not present

## 2021-03-19 DIAGNOSIS — C679 Malignant neoplasm of bladder, unspecified: Secondary | ICD-10-CM

## 2021-03-19 DIAGNOSIS — C672 Malignant neoplasm of lateral wall of bladder: Secondary | ICD-10-CM

## 2021-03-19 DIAGNOSIS — E1100 Type 2 diabetes mellitus with hyperosmolarity without nonketotic hyperglycemic-hyperosmolar coma (NKHHC): Secondary | ICD-10-CM

## 2021-03-19 LAB — CBC WITH DIFFERENTIAL (CANCER CENTER ONLY)
Abs Immature Granulocytes: 0.02 10*3/uL (ref 0.00–0.07)
Basophils Absolute: 0.1 10*3/uL (ref 0.0–0.1)
Basophils Relative: 1 %
Eosinophils Absolute: 0.7 10*3/uL — ABNORMAL HIGH (ref 0.0–0.5)
Eosinophils Relative: 10 %
HCT: 44.5 % (ref 39.0–52.0)
Hemoglobin: 16.1 g/dL (ref 13.0–17.0)
Immature Granulocytes: 0 %
Lymphocytes Relative: 25 %
Lymphs Abs: 1.7 10*3/uL (ref 0.7–4.0)
MCH: 30.7 pg (ref 26.0–34.0)
MCHC: 36.2 g/dL — ABNORMAL HIGH (ref 30.0–36.0)
MCV: 84.8 fL (ref 80.0–100.0)
Monocytes Absolute: 0.7 10*3/uL (ref 0.1–1.0)
Monocytes Relative: 10 %
Neutro Abs: 3.6 10*3/uL (ref 1.7–7.7)
Neutrophils Relative %: 54 %
Platelet Count: 187 10*3/uL (ref 150–400)
RBC: 5.25 MIL/uL (ref 4.22–5.81)
RDW: 12 % (ref 11.5–15.5)
WBC Count: 6.6 10*3/uL (ref 4.0–10.5)
nRBC: 0 % (ref 0.0–0.2)

## 2021-03-19 LAB — CMP (CANCER CENTER ONLY)
ALT: 21 U/L (ref 0–44)
AST: 15 U/L (ref 15–41)
Albumin: 3.9 g/dL (ref 3.5–5.0)
Alkaline Phosphatase: 92 U/L (ref 38–126)
Anion gap: 10 (ref 5–15)
BUN: 39 mg/dL — ABNORMAL HIGH (ref 8–23)
CO2: 26 mmol/L (ref 22–32)
Calcium: 9.5 mg/dL (ref 8.9–10.3)
Chloride: 96 mmol/L — ABNORMAL LOW (ref 98–111)
Creatinine: 2.25 mg/dL — ABNORMAL HIGH (ref 0.61–1.24)
GFR, Estimated: 31 mL/min — ABNORMAL LOW (ref >60)
Glucose, Bld: 445 mg/dL — ABNORMAL HIGH (ref 70–99)
Potassium: 4.2 mmol/L (ref 3.5–5.1)
Sodium: 132 mmol/L — ABNORMAL LOW (ref 135–145)
Total Bilirubin: 0.8 mg/dL (ref 0.3–1.2)
Total Protein: 7.1 g/dL (ref 6.5–8.1)

## 2021-03-19 LAB — MAGNESIUM: Magnesium: 1.5 mg/dL — ABNORMAL LOW (ref 1.7–2.4)

## 2021-03-19 MED ORDER — SODIUM CHLORIDE 0.9 % IV SOLN: Freq: Once | INTRAVENOUS | Status: AC

## 2021-03-19 MED ORDER — INSULIN ASPART 100 UNIT/ML IJ SOLN
10.0000 [IU] | Freq: Once | INTRAMUSCULAR | Status: AC
  Administered 2021-03-19: 10 [IU] via SUBCUTANEOUS
  Filled 2021-03-19: qty 1

## 2021-03-19 NOTE — Patient Instructions (Addendum)
Insulin Aspart Injection What is this medication? INSULIN ASPART (IN su lin AS part) treats diabetes. It works by increasing insulin levels in your body, which decreases your blood sugar (glucose). It belongs to a group of medications called rapid-acting insulins. Changes to diet and exercise are often combined with this medication. This medicine may be used for other purposes; ask your health care provider or pharmacist if you have questions. COMMON BRAND NAME(S): Fiasp, [employer], Medtronic, NovoLog, NovoLog Flexpen, NovoLog PenFill What should I tell my care team before I take this medication? They need to know if you have any of these conditions: Episodes of low blood sugar Eye disease, vision problems Kidney disease Liver disease An unusual or allergic reaction to insulin, metacresol, other medications, foods, dyes, or preservatives Pregnant or trying to get pregnant Breast-feeding How should I use this medication? This medication is for injection under the skin. Use exactly as directed. It is important to follow the directions given to you by your care team. If you are using Novolog, you should start your meal within 5 to 10 minutes after injection. If you are using Fiasp, you should start your meal at the time of injection or within 20 minutes after injection. Have food ready before injection. Do not delay eating. You will be taught how to use this medication and how to adjust doses for activities and illness. Do not use more insulin than prescribed. Do not use more or less often than prescribed. Always check the appearance of your insulin before using it. This medication should be clear and colorless like water. Do not use if it is cloudy, thickened, colored, or has solid particles in it. If you use a pen, be sure to take off the outer needle cover before using the dose. It is important that you put your used needles and syringes in a special sharps container. Do not put them in  a trash can. If you do not have a sharps container, call your pharmacist or care team to get one. This medication comes with INSTRUCTIONS FOR USE. Ask your pharmacist for directions on how to use this medication. Read the information carefully. Talk to your pharmacist or care team if you have questions. Talk to your care team regarding the use of this medication in children. While this medication may be prescribed for children as young as 2 years for selected conditions, precautions do apply. Overdosage: If you think you have taken too much of this medicine contact a poison control center or emergency room at once. NOTE: This medicine is only for you. Do not share this medicine with others. What if I miss a dose? It is important not to miss a dose. Your care team should discuss a plan for missed doses with you. If you do miss a dose, follow their plan. Do not take double doses. What may interact with this medication? Alcohol containing beverages Antiviral medications for HIV or AIDS Aspirin and aspirin-like medications Beta-blockers like atenolol, metoprolol, propranolol Certain medications for depression, anxiety, or psychotic disturbances Chromium Clonidine Diuretics Estrogens and progestins Guanethidine Heart medications Isoniazid MAOIs like Carbex, Eldepryl, Marplan, Nardil, and Parnate Male hormones or anabolic steroids Medications for weight loss Medications for allergies, asthma, cold, or cough Niacin NSAIDs, medications for pain and inflammation, like ibuprofen or naproxen Octreotide Other medications for diabetes, like glyburide, glipizide, or glimepiride Pentamidine Phenytoin Probenecid Quinolone antibiotics like ciprofloxacin, levofloxacin, ofloxacin Reserpine Some herbal dietary supplements Steroid medications like prednisone or cortisone Sulfamethoxazole; trimethoprim Thyroid  medication This list may not describe all possible interactions. Give your health care  provider a list of all the medicines, herbs, non-prescription drugs, or dietary supplements you use. Also tell them if you smoke, drink alcohol, or use illegal drugs. Some items may interact with your medicine. What should I watch for while using this medication? Visit your care team for regular checks on your progress. A test called the HbA1C (A1C) will be monitored. This is a simple blood test. It measures your blood sugar control over the last 2 to 3 months. You will receive this test every 3 to 6 months. Learn how to check your blood sugar. Learn the symptoms of low and high blood sugar and how to manage them. Always carry a quick-source of sugar with you in case you have symptoms of low blood sugar. Examples include hard sugar candy or glucose tablets. Make sure others know that you can choke if you eat or drink when you develop serious symptoms of low blood sugar, such as seizures or unconsciousness. They must get medical help at once. Tell your care team if you have high blood sugar. You might need to change the dose of your medication. If you are sick or exercising more than usual, you might need to change the dose of your medication. Do not skip meals. Ask your care team if you should avoid alcohol. Many nonprescription cough and cold products contain sugar or alcohol. These can affect blood sugar. Make sure that you have the right kind of syringe for the type of insulin you use. Try not to change the brand and type of insulin or syringe unless your care team tells you to. Switching insulin brand or type can cause dangerously high or low blood sugar. Always keep an extra supply of insulin, syringes, and needles on hand. Use a syringe one time only. Throw away syringe and needle in a closed container to prevent accidental needle sticks. Insulin pens and cartridges should never be shared. Even if the needle is changed, sharing may result in passing of viruses like hepatitis or HIV. Each time you get  a new box of pen needles, check to see if they are the same type as the ones you were trained to use. If not, ask your care team to show you how to use this new type properly. Wear a medical ID bracelet or chain, and carry a card that describes your disease and details of your medication and dosage times. What side effects may I notice from receiving this medication? Side effects that you should report to your care team as soon as possible: Allergic reactions--skin rash, itching, hives, swelling of the face, lips, tongue, or throat Low blood sugar (hypoglycemia)--tremors or shaking, anxiety, sweating, cold or clammy skin, confusion, dizziness, rapid heartbeat Low potassium level--muscle pain or cramps, unusual weakness or fatigue, fast or irregular heartbeat, constipation Side effects that usually do not require medical attention (report to your care team if they continue or are bothersome): Lipodystrophy--hardening or scarring of tissue at injection site Pain, redness, or irritation at injection site Weight gain This list may not describe all possible side effects. Call your doctor for medical advice about side effects. You may report side effects to FDA at 1-800-FDA-1088. Where should I keep my medication? Keep out of the reach of children and pets. Unopened Vials: Novolog Vials: Store in a refrigerator between 2 and 8 degrees C (36 and 46 degrees F) or at room temperature below 30 degrees C (86  degrees F). Do not freeze or use if the insulin has been frozen. Protect from light and excessive heat. If stored at room temperature, the vial must be discarded after 28 days. Throw away any unopened and unused medication that has been stored in the refrigerator after the expiration date. Fiasp Vials: Store in a refrigerator between 2 and 8 degrees C (36 and 46 degrees F) or at room temperature below 30 degrees C (86 degrees F). Do not freeze or use if the insulin has been frozen. Protect from light and  excessive heat. If stored at room temperature, the vial must be discarded after 28 days. Throw away any unopened and unused medication that has been stored in the refrigerator after the expiration date. Unopened Pens and Cartridges: Novolog Flexpens and cartridges: Store in a refrigerator between 2 and 8 degrees C (36 and 46 degrees F) or at room temperature below 30 degrees C (86 degrees F). Do not freeze or use if the insulin has been frozen. Protect from light and excessive heat. If stored at room temperature, the pen or cartridge must be discarded after 28 days. Throw away any unopened and unused medication that has been stored in the refrigerator after the expiration date. Fiasp FlexTouch pens: Store in a refrigerator between 2 and 8 degrees C (36 and 46 degrees F) or at room temperature below 30 degrees C (86 degrees F). Do not freeze or use if the insulin has been frozen. Protect from light and excessive heat. If stored at room temperature, the pen must be discarded after 28 days. Throw away any unopened and unused medication that has been stored in the refrigerator after the expiration date. Fiasp FlexTouch cartridges: Store at room temperature below 30 degrees C (86 degrees F). Do not refrigerate or freeze. Keep away from heat and light. Throw the cartridge away after 28 days, even if it still has insulin left in it. Vials that you are using: Novolog Vials: Store in the refrigerator or at room temperature below 30 degrees C (86 degrees F). Do not freeze. Keep away from heat and light. Throw the opened vial away after 28 days. Fiasp Vials: Store in the refrigerator or at room temperature below 30 degrees C (86 degrees F). Do not freeze. Keep away from heat and light. Throw the opened vial away after 28 days. Pens and cartridges that you are using: Novolog Flexpens and cartridges: Store at room temperature below 30 degrees C (86 degrees F). Do not refrigerate or freeze. Keep away from heat and light.  Throw away the pen or cartridge after 28 days, even if it still has insulin left in it. Fiasp FlexTouch pens: Store in the refrigerator or at room temperature below 30 degrees C (86 degrees F). Do not freeze. Keep away from heat and light. Throw the pen away after 28 days, even if it still has insulin left in it. Fiasp FlexTouch cartridges: Store at room temperature below 30 degrees C (86 degrees F). Do not refrigerate or freeze. Keep away from heat and light. Throw the cartridge away after 28 days, even if it still has insulin left in it. NOTE: This sheet is a summary. It may not cover all possible information. If you have questions about this medicine, talk to your doctor, pharmacist, or health care provider.  2022 Elsevier/Gold Standard (2020-04-14 00:00:00)  Rehydration, Adult Rehydration is the replacement of body fluids, salts, and minerals (electrolytes) that are lost during dehydration. Dehydration is when there is not enough water  or other fluids in the body. This happens when you lose more fluids than you take in. Common causes of dehydration include: Not drinking enough fluids. This can occur when you are ill or doing activities that require a lot of energy, especially in hot weather. Conditions that cause loss of water or other fluids, such as diarrhea, vomiting, sweating, or urinating a lot. Other illnesses, such as fever or infection. Certain medicines, such as those that remove excess fluid from the body (diuretics). Symptoms of mild or moderate dehydration may include thirst, dry lips and mouth, and dizziness. Symptoms of severe dehydration may include increased heart rate, confusion, fainting, and not urinating. For severe dehydration, you may need to get fluids through an IV at the hospital. For mild or moderate dehydration, you can usually rehydrate at home by drinking certain fluids as told by your health care provider. What are the risks? Generally, rehydration is safe. However,  taking in too much fluid (overhydration) can be a problem. This is rare. Overhydration can cause an electrolyte imbalance, kidney failure, or a decrease in salt (sodium) levels in the body. Supplies needed You will need an oral rehydration solution (ORS) if your health care provider tells you to use one. This is a drink to treat dehydration. It can be found in pharmacies and retail stores. How to rehydrate Fluids Follow instructions from your health care provider for rehydration. The kind of fluid and the amount you should drink depend on your condition. In general, you should choose drinks that you prefer. If told by your health care provider, drink an ORS. Make an ORS by following instructions on the package. Start by drinking small amounts, about  cup (120 mL) every 5-10 minutes. Slowly increase how much you drink until you have taken the amount recommended by your health care provider. Drink enough clear fluids to keep your urine pale yellow. If you were told to drink an ORS, finish it first, then start slowly drinking other clear fluids. Drink fluids such as: Water. This includes sparkling water and flavored water. Drinking only water can lead to having too little sodium in your body (hyponatremia). Follow the advice of your health care provider. Water from ice chips you suck on. Fruit juice with water you add to it (diluted). Sports drinks. Hot or cold herbal teas. Broth-based soups. Milk or milk products. Food Follow instructions from your health care provider about what to eat while you rehydrate. Your health care provider may recommend that you slowly begin eating regular foods in small amounts. Eat foods that contain a healthy balance of electrolytes, such as bananas, oranges, potatoes, tomatoes, and spinach. Avoid foods that are greasy or contain a lot of sugar. In some cases, you may get nutrition through a feeding tube that is passed through your nose and into your stomach  (nasogastric tube, or NG tube). This may be done if you have uncontrolled vomiting or diarrhea. Beverages to avoid Certain beverages may make dehydration worse. While you rehydrate, avoid drinking alcohol. How to tell if you are recovering from dehydration You may be recovering from dehydration if: You are urinating more often than before you started rehydrating. Your urine is pale yellow. Your energy level improves. You vomit less frequently. You have diarrhea less frequently. Your appetite improves or returns to normal. You feel less dizzy or less light-headed. Your skin tone and color start to look more normal. Follow these instructions at home: Take over-the-counter and prescription medicines only as told by your health  care provider. Do not take sodium tablets. Doing this can lead to having too much sodium in your body (hypernatremia). Contact a health care provider if: You continue to have symptoms of mild or moderate dehydration, such as: Thirst. Dry lips. Slightly dry mouth. Dizziness. Dark urine or less urine than normal. Muscle cramps. You continue to vomit or have diarrhea. Get help right away if you: Have symptoms of dehydration that get worse. Have a fever. Have a severe headache. Have been vomiting and the following happens: Your vomiting gets worse or does not go away. Your vomit includes blood or green matter (bile). You cannot eat or drink without vomiting. Have problems with urination or bowel movements, such as: Diarrhea that gets worse or does not go away. Blood in your stool (feces). This may cause stool to look black and tarry. Not urinating, or urinating only a small amount of very dark urine, within 6-8 hours. Have trouble breathing. Have symptoms that get worse with treatment. These symptoms may represent a serious problem that is an emergency. Do not wait to see if the symptoms will go away. Get medical help right away. Call your local emergency  services (911 in the [place] Do not drive yourself to the hospital. Summary Rehydration is the replacement of body fluids and minerals (electrolytes) that are lost during dehydration. Follow instructions from your health care provider for rehydration. The kind of fluid and amount you should drink depend on your condition. Slowly increase how much you drink until you have taken the amount recommended by your health care provider. Contact your health care provider if you continue to show signs of mild or moderate dehydration. This information is not intended to replace advice given to you by your health care provider. Make sure you discuss any questions you have with your health care provider. Document Revised: 03/30/2019 Document Reviewed: 02/07/2019 Elsevier Patient Education  2022 [employer].

## 2021-03-19 NOTE — Progress Notes (Signed)
Per Dr. Alen Blew, no treatment today with today's labs. Glucose 445 Mg/dL, Creatine 2.25 Mg/dL, Sodium 132 mmol/L. Pt to receive Novolog insulin 10 units.

## 2021-03-20 ENCOUNTER — Other Ambulatory Visit: Payer: Self-pay

## 2021-03-20 ENCOUNTER — Ambulatory Visit
Admission: RE | Admit: 2021-03-20 | Discharge: 2021-03-20 | Disposition: A | Discharge status: Home / Self Care | Payer: 59 | Source: Ambulatory Visit | Attending: Radiation Oncology | Admitting: Radiation Oncology

## 2021-03-20 DIAGNOSIS — Z510 Encounter for antineoplastic radiation therapy: Secondary | ICD-10-CM | POA: Diagnosis not present

## 2021-03-21 ENCOUNTER — Ambulatory Visit
Admission: RE | Admit: 2021-03-21 | Discharge: 2021-03-21 | Disposition: A | Discharge status: Home / Self Care | Payer: 59 | Source: Ambulatory Visit | Attending: Radiation Oncology | Admitting: Radiation Oncology

## 2021-03-21 DIAGNOSIS — Z510 Encounter for antineoplastic radiation therapy: Secondary | ICD-10-CM | POA: Diagnosis not present

## 2021-03-22 ENCOUNTER — Telehealth: Payer: Self-pay | Admitting: Oncology

## 2021-03-22 ENCOUNTER — Ambulatory Visit
Admission: RE | Admit: 2021-03-22 | Discharge: 2021-03-22 | Disposition: A | Discharge status: Home / Self Care | Payer: 59 | Source: Ambulatory Visit | Attending: Radiation Oncology | Admitting: Radiation Oncology

## 2021-03-22 DIAGNOSIS — Z510 Encounter for antineoplastic radiation therapy: Secondary | ICD-10-CM | POA: Diagnosis not present

## 2021-03-22 NOTE — Telephone Encounter (Signed)
Called patient regarding upcoming appointments, patient has been called and voicemail was left. °

## 2021-03-25 ENCOUNTER — Other Ambulatory Visit: Payer: Self-pay

## 2021-03-25 ENCOUNTER — Ambulatory Visit
Admission: RE | Admit: 2021-03-25 | Discharge: 2021-03-25 | Disposition: A | Discharge status: Home / Self Care | Payer: 59 | Source: Ambulatory Visit | Attending: Radiation Oncology | Admitting: Radiation Oncology

## 2021-03-25 DIAGNOSIS — Z510 Encounter for antineoplastic radiation therapy: Secondary | ICD-10-CM | POA: Diagnosis not present

## 2021-03-25 MED FILL — Fosaprepitant Dimeglumine For IV Infusion 150 MG (Base Eq): INTRAVENOUS | Qty: 5 | Status: AC

## 2021-03-25 MED FILL — Dexamethasone Sodium Phosphate Inj 100 MG/10ML: INTRAMUSCULAR | Qty: 1 | Status: AC

## 2021-03-26 ENCOUNTER — Inpatient Hospital Stay (HOSPITAL_BASED_OUTPATIENT_CLINIC_OR_DEPARTMENT_OTHER): Payer: 59 | Admitting: Oncology

## 2021-03-26 ENCOUNTER — Inpatient Hospital Stay: Payer: 59

## 2021-03-26 ENCOUNTER — Other Ambulatory Visit: Payer: Self-pay | Admitting: Oncology

## 2021-03-26 ENCOUNTER — Ambulatory Visit
Admission: RE | Admit: 2021-03-26 | Discharge: 2021-03-26 | Disposition: A | Discharge status: Home / Self Care | Payer: 59 | Source: Ambulatory Visit | Attending: Radiation Oncology | Admitting: Radiation Oncology

## 2021-03-26 VITALS — BP 135/92 | HR 90 | Temp 98.3°F | Resp 19 | Ht 74.0 in | Wt 288.9 lb

## 2021-03-26 DIAGNOSIS — C679 Malignant neoplasm of bladder, unspecified: Secondary | ICD-10-CM

## 2021-03-26 DIAGNOSIS — C672 Malignant neoplasm of lateral wall of bladder: Secondary | ICD-10-CM | POA: Diagnosis not present

## 2021-03-26 DIAGNOSIS — R9389 Abnormal findings on diagnostic imaging of other specified body structures: Secondary | ICD-10-CM

## 2021-03-26 DIAGNOSIS — Z510 Encounter for antineoplastic radiation therapy: Secondary | ICD-10-CM | POA: Diagnosis not present

## 2021-03-26 DIAGNOSIS — E1100 Type 2 diabetes mellitus with hyperosmolarity without nonketotic hyperglycemic-hyperosmolar coma (NKHHC): Secondary | ICD-10-CM

## 2021-03-26 LAB — CMP (CANCER CENTER ONLY)
ALT: 16 U/L (ref 0–44)
AST: 12 U/L — ABNORMAL LOW (ref 15–41)
Albumin: 3.7 g/dL (ref 3.5–5.0)
Alkaline Phosphatase: 81 U/L (ref 38–126)
Anion gap: 8 (ref 5–15)
BUN: 31 mg/dL — ABNORMAL HIGH (ref 8–23)
CO2: 27 mmol/L (ref 22–32)
Calcium: 9.1 mg/dL (ref 8.9–10.3)
Chloride: 97 mmol/L — ABNORMAL LOW (ref 98–111)
Creatinine: 1.79 mg/dL — ABNORMAL HIGH (ref 0.61–1.24)
GFR, Estimated: 41 mL/min — ABNORMAL LOW (ref >60)
Glucose, Bld: 411 mg/dL — ABNORMAL HIGH (ref 70–99)
Potassium: 4.3 mmol/L (ref 3.5–5.1)
Sodium: 132 mmol/L — ABNORMAL LOW (ref 135–145)
Total Bilirubin: 0.9 mg/dL (ref 0.3–1.2)
Total Protein: 6.8 g/dL (ref 6.5–8.1)

## 2021-03-26 LAB — CBC WITH DIFFERENTIAL (CANCER CENTER ONLY)
Abs Immature Granulocytes: 0.01 10*3/uL (ref 0.00–0.07)
Basophils Absolute: 0.1 10*3/uL (ref 0.0–0.1)
Basophils Relative: 1 %
Eosinophils Absolute: 0.8 10*3/uL — ABNORMAL HIGH (ref 0.0–0.5)
Eosinophils Relative: 13 %
HCT: 42.3 % (ref 39.0–52.0)
Hemoglobin: 14.8 g/dL (ref 13.0–17.0)
Immature Granulocytes: 0 %
Lymphocytes Relative: 23 %
Lymphs Abs: 1.5 10*3/uL (ref 0.7–4.0)
MCH: 30.3 pg (ref 26.0–34.0)
MCHC: 35 g/dL (ref 30.0–36.0)
MCV: 86.5 fL (ref 80.0–100.0)
Monocytes Absolute: 0.7 10*3/uL (ref 0.1–1.0)
Monocytes Relative: 11 %
Neutro Abs: 3.5 10*3/uL (ref 1.7–7.7)
Neutrophils Relative %: 52 %
Platelet Count: 161 10*3/uL (ref 150–400)
RBC: 4.89 MIL/uL (ref 4.22–5.81)
RDW: 12.2 % (ref 11.5–15.5)
WBC Count: 6.6 10*3/uL (ref 4.0–10.5)
nRBC: 0 % (ref 0.0–0.2)

## 2021-03-26 MED ORDER — SODIUM CHLORIDE 0.9 % IV SOLN: Freq: Once | INTRAVENOUS | Status: AC

## 2021-03-26 MED ORDER — INSULIN ASPART 100 UNIT/ML IJ SOLN
10.0000 [IU] | Freq: Once | INTRAMUSCULAR | Status: AC
  Administered 2021-03-26: 10 [IU] via SUBCUTANEOUS
  Filled 2021-03-26: qty 1

## 2021-03-26 MED ORDER — SODIUM CHLORIDE 0.9 % IV SOLN
10.0000 mg | Freq: Once | INTRAVENOUS | Status: AC
  Administered 2021-03-26: 10 mg via INTRAVENOUS
  Filled 2021-03-26: qty 10

## 2021-03-26 MED ORDER — SODIUM CHLORIDE 0.9 % IV SOLN
198.0000 mg | Freq: Once | INTRAVENOUS | Status: AC
  Administered 2021-03-26: 200 mg via INTRAVENOUS
  Filled 2021-03-26: qty 20

## 2021-03-26 MED ORDER — PALONOSETRON HCL INJECTION 0.25 MG/5ML
0.2500 mg | Freq: Once | INTRAVENOUS | Status: AC
  Administered 2021-03-26: 0.25 mg via INTRAVENOUS
  Filled 2021-03-26: qty 5

## 2021-03-26 NOTE — Progress Notes (Signed)
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     A cycle is every 7 days:     Cisplatin   **Always confirm dose/schedule in your pharmacy ordering system**  REASON: Toxicities / Adverse Event PRIOR TREATMENT: [id number]: Cisplatin 35 mg/m2 Weekly With Concurrent Pelvic XRT TREATMENT RESPONSE: Unable to Evaluate  START OFF PATHWAY REGIMEN - Bladder   [id number]:Carboplatin AUC=2:   Administer weekly:     Carboplatin   **Always confirm dose/schedule in your pharmacy ordering system**  Patient Characteristics: Pre-Cystectomy or Nonsurgical Candidate (Clinical Staging), cT2-4a, cN0-1, M0, Bladder-Sparing Approach Therapeutic Status: Pre-Cystectomy or Nonsurgical Candidate (Clinical Staging) AJCC M Category: cM0 AJCC 8 Stage Grouping: II AJCC T Category: cT2 AJCC N Category: cN0 Intent of Therapy: Curative Intent, Discussed with Patient

## 2021-03-26 NOTE — Progress Notes (Signed)
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  Dose of 75 mg per metered square with radiation would be used for the duration of his therapy.  I do not anticipate any changes in his tolerance with this regimen.  We will continue to monitor his status.

## 2021-03-26 NOTE — Addendum Note (Signed)
Addended by: Wyatt Portela on: 03/26/2021 01:43 PM   Modules accepted: Orders

## 2021-03-26 NOTE — Patient Instructions (Signed)
[facility]  Discharge Instructions: Thank you for choosing [facility] to provide your oncology and hematology care.   If you have a lab appointment with the [facility], please go directly to the [facility] and check in at the registration area.   Wear comfortable clothing and clothing appropriate for easy access to any Portacath or PICC line.   We strive to give you quality time with your provider. You may need to reschedule your appointment if you arrive late (15 or more minutes).  Arriving late affects you and other patients whose appointments are after yours.  Also, if you miss three or more appointments without notifying the office, you may be dismissed from the clinic at the providers discretion.      For prescription refill requests, have your pharmacy contact our office and allow 72 hours for refills to be completed.    Today you received the following chemotherapy and/or immunotherapy agents Carboplatin       To help prevent nausea and vomiting after your treatment, we encourage you to take your nausea medication as directed.  BELOW ARE SYMPTOMS THAT SHOULD BE REPORTED IMMEDIATELY: *FEVER GREATER THAN 100.4 F (38 C) OR HIGHER *CHILLS OR SWEATING *NAUSEA AND VOMITING THAT IS NOT CONTROLLED WITH YOUR NAUSEA MEDICATION *UNUSUAL SHORTNESS OF BREATH *UNUSUAL BRUISING OR BLEEDING *URINARY PROBLEMS (pain or burning when urinating, or frequent urination) *BOWEL PROBLEMS (unusual diarrhea, constipation, pain near the anus) TENDERNESS IN MOUTH AND THROAT WITH OR WITHOUT PRESENCE OF ULCERS (sore throat, sores in mouth, or a toothache) UNUSUAL RASH, SWELLING OR PAIN  UNUSUAL VAGINAL DISCHARGE OR ITCHING   Items with * indicate a potential emergency and should be followed up as soon as possible or go to the Emergency Department if any problems should occur.  Please show the CHEMOTHERAPY ALERT CARD or IMMUNOTHERAPY ALERT CARD at check-in  to the Emergency Department and triage nurse.  Should you have questions after your visit or need to cancel or reschedule your appointment, please contact [facility]  Dept: [phone]  and follow the prompts.  Office hours are 8:00 a.m. to 4:30 p.m. Monday - Friday. Please note that voicemails left after 4:00 p.m. may not be returned until the following business day.  We are closed weekends and major holidays. You have access to a nurse at all times for urgent questions. Please call the main number to the clinic Dept: [phone] and follow the prompts.   For any non-urgent questions, you may also contact your provider using MyChart. We now offer e-Visits for anyone 46 and older to request care online for non-urgent symptoms. For details visit [email or web address].   Also download the MyChart app! Go to the app store, search "MyChart", open the app, select [facility], and log in with your MyChart username and password.  Due to Covid, a mask is required upon entering the hospital/clinic. If you do not have a mask, one will be given to you upon arrival. For doctor visits, patients may have 1 support person aged 53 or older with them. For treatment visits, patients cannot have anyone with them due to current Covid guidelines and our immunocompromised population.   Carboplatin injection What is this medication? CARBOPLATIN (KAR boe pla tin) is a chemotherapy drug. It targets fast dividing cells, like cancer cells, and causes these cells to die. This medicine is used to treat ovarian cancer and many other cancers. This medicine may be used for other  purposes; ask your health care provider or pharmacist if you have questions. COMMON BRAND NAME(S): Paraplatin What should I tell my care team before I take this medication? They need to know if you have any of these conditions: blood disorders hearing problems kidney disease recent or ongoing radiation therapy an  unusual or allergic reaction to carboplatin, cisplatin, other chemotherapy, other medicines, foods, dyes, or preservatives pregnant or trying to get pregnant breast-feeding How should I use this medication? This drug is usually given as an infusion into a vein. It is administered in a hospital or clinic by a specially trained health care professional. Talk to your pediatrician regarding the use of this medicine in children. Special care may be needed. Overdosage: If you think you have taken too much of this medicine contact a poison control center or emergency room at once. NOTE: This medicine is only for you. Do not share this medicine with others. What if I miss a dose? It is important not to miss a dose. Call your doctor or health care professional if you are unable to keep an appointment. What may interact with this medication? medicines for seizures medicines to increase blood counts like filgrastim, pegfilgrastim, sargramostim some antibiotics like amikacin, gentamicin, neomycin, streptomycin, tobramycin vaccines Talk to your doctor or health care professional before taking any of these medicines: acetaminophen aspirin ibuprofen ketoprofen naproxen This list may not describe all possible interactions. Give your health care provider a list of all the medicines, herbs, non-prescription drugs, or dietary supplements you use. Also tell them if you smoke, drink alcohol, or use illegal drugs. Some items may interact with your medicine. What should I watch for while using this medication? Your condition will be monitored carefully while you are receiving this medicine. You will need important blood work done while you are taking this medicine. This drug may make you feel generally unwell. This is not uncommon, as chemotherapy can affect healthy cells as well as cancer cells. Report any side effects. Continue your course of treatment even though you feel ill unless your doctor tells you to  stop. In some cases, you may be given additional medicines to help with side effects. Follow all directions for their use. Call your doctor or health care professional for advice if you get a fever, chills or sore throat, or other symptoms of a cold or flu. Do not treat yourself. This drug decreases your body's ability to fight infections. Try to avoid being around people who are sick. This medicine may increase your risk to bruise or bleed. Call your doctor or health care professional if you notice any unusual bleeding. Be careful brushing and flossing your teeth or using a toothpick because you may get an infection or bleed more easily. If you have any dental work done, tell your dentist you are receiving this medicine. Avoid taking products that contain aspirin, acetaminophen, ibuprofen, naproxen, or ketoprofen unless instructed by your doctor. These medicines may hide a fever. Do not become pregnant while taking this medicine. Women should inform their doctor if they wish to become pregnant or think they might be pregnant. There is a potential for serious side effects to an unborn child. Talk to your health care professional or pharmacist for more information. Do not breast-feed an infant while taking this medicine. What side effects may I notice from receiving this medication? Side effects that you should report to your doctor or health care professional as soon as possible: allergic reactions like skin rash, itching or hives,  swelling of the face, lips, or tongue signs of infection - fever or chills, cough, sore throat, pain or difficulty passing urine signs of decreased platelets or bleeding - bruising, pinpoint red spots on the skin, black, tarry stools, nosebleeds signs of decreased red blood cells - unusually weak or tired, fainting spells, lightheadedness breathing problems changes in hearing changes in vision chest pain high blood pressure low blood counts - This drug may decrease the  number of white blood cells, red blood cells and platelets. You may be at increased risk for infections and bleeding. nausea and vomiting pain, swelling, redness or irritation at the injection site pain, tingling, numbness in the hands or feet problems with balance, talking, walking trouble passing urine or change in the amount of urine Side effects that usually do not require medical attention (report to your doctor or health care professional if they continue or are bothersome): hair loss loss of appetite metallic taste in the mouth or changes in taste This list may not describe all possible side effects. Call your doctor for medical advice about side effects. You may report side effects to FDA at 1-800-FDA-1088. Where should I keep my medication? This drug is given in a hospital or clinic and will not be stored at home. NOTE: This sheet is a summary. It may not cover all possible information. If you have questions about this medicine, talk to your doctor, pharmacist, or health care provider.  2022 Elsevier/Gold Standard (2007-07-07 00:00:00)

## 2021-03-26 NOTE — Progress Notes (Signed)
DISCONTINUE OFF PATHWAY REGIMEN - Bladder   [id number]:Carboplatin AUC=2:   Administer weekly:     Carboplatin   **Always confirm dose/schedule in your pharmacy ordering system**  REASON: Other Reason PRIOR TREATMENT: Off Pathway: Carboplatin AUC=2 TREATMENT RESPONSE: Unable to Evaluate  START ON PATHWAY REGIMEN - Bladder   Gemcitabine 27 mg/m2 IV Twice Weekly + RT x 4 Weeks (Induction):   One induction cycle is 4 weeks:     Gemcitabine    Gemcitabine 27 mg/m2 IV Twice Weekly + RT x 2.5 Weeks (Consolidation):   One consolidation cycle is 2.5 weeks:     Gemcitabine   **Always confirm dose/schedule in your pharmacy ordering system**  Patient Characteristics: Pre-Cystectomy or Nonsurgical Candidate (Clinical Staging), cT2-4a, cN0-1, M0, Bladder-Sparing Approach Therapeutic Status: Pre-Cystectomy or Nonsurgical Candidate (Clinical Staging) AJCC M Category: cM0 AJCC 8 Stage Grouping: II AJCC T Category: cT2 AJCC N Category: cN0 Intent of Therapy: Curative Intent, Discussed with Patient

## 2021-03-26 NOTE — Progress Notes (Signed)
Pharmacist Chemotherapy Monitoring - Initial Assessment    Anticipated start date: 04/02/21   The following has been reviewed per standard work regarding the patient's treatment regimen: The patient's diagnosis, treatment plan and drug doses, and organ/hematologic function Lab orders and baseline tests specific to treatment regimen  The treatment plan start date, drug sequencing, and pre-medications Prior authorization status  Patient's documented medication list, including drug-drug interaction screen and prescriptions for anti-emetics and supportive care specific to the treatment regimen The drug concentrations, fluid compatibility, administration routes, and timing of the medications to be used The patient's access for treatment and lifetime cumulative dose history, if applicable  The patient's medication allergies and previous infusion related reactions, if applicable   Changes made to treatment plan:  N/A  Follow up needed:  Pending authorization for treatment    Kennith Center, Pharm.D., CPP 03/26/2021@3 :02 PM

## 2021-03-26 NOTE — Progress Notes (Signed)
Hematology and Oncology Follow Up Visit  Darius [name] [id number] [birth year] 66 y.o. 03/26/2021 8:53 AM Darius [name], Darius Foley, NP   Principle Diagnosis: 66 year old with bladder cancer diagnosed in November 2022.  He presented with T2N0 high-grade urothelial carcinoma   Prior Therapy:  He is s/p TURBT in November 2022 and repeated in January 2023.  Current therapy: Definitive radiation therapy with weekly cisplatin projected to start on March 11, 2021.  Therapy changed to carboplatin given changes in kidney function study on March 26, 2021.  Interim History: Darius [name] is here for return evaluation.  Since the last visit, he reports no major changes in his health.  He has tolerated the current treatment without any major complaints.  He denies any nausea, vomiting or abdominal pain.  He denies any urinary frequency or urgency.  He denies any hematuria or dysuria.  He has experienced episodes of hypoglycemia but asymptomatic from it.     Medications: Updated on review. Current Outpatient Medications  Medication Sig Dispense Refill   apixaban (ELIQUIS) 5 MG TABS tablet TAKE 1 TABLET (5 MG TOTAL) BY MOUTH 2 (TWO) TIMES DAILY. 60 tablet 0   atorvastatin (LIPITOR) 80 MG tablet TAKE 1 TABLET BY MOUTH AT BEDTIME. 30 tablet 6   ezetimibe (ZETIA) 10 MG tablet Take 1 tablet (10 mg total) by mouth daily. 90 tablet 3   metFORMIN (GLUCOPHAGE) 1000 MG tablet Take 1 tablet (1,000 mg total) by mouth 2 (two) times daily with food. 60 tablet 1   metoprolol (TOPROL-XL) 200 MG 24 hr tablet Take 1 tablet (200 mg total) by mouth daily. Take with or immediately following a meal. (Patient taking differently: Take 200 mg by mouth daily. Take with or immediately following a meal.) 90 tablet 1   oxybutynin (DITROPAN) 5 MG tablet Take 1 tablet (5 mg total) by mouth 2 (two) times daily as needed for bladder spasms. 10 tablet 0   oxyCODONE (OXY IR/ROXICODONE) 5 MG immediate release tablet Take 1  tablet  by mouth every 4 hours as needed for moderate pain. 30 tablet 0   prochlorperazine (COMPAZINE) 10 MG tablet Take 1 tablet (10 mg total) by mouth every 6 (six) hours as needed for nausea or vomiting. (Patient not taking: Reported on 02/21/2021) 30 tablet 0   sacubitril-valsartan (ENTRESTO) 97-103 MG Take 1 tablet by mouth 2 (two) times daily. (Patient taking differently: Take 1 tablet by mouth 2 (two) times daily.) 60 tablet 3   spironolactone (ALDACTONE) 25 MG tablet Take 1 tablet (25 mg total) by mouth daily. 40 tablet 0   tamsulosin (FLOMAX) 0.4 MG CAPS capsule Take 1 capsule (0.4 mg total) by mouth daily after supper. 30 capsule 3   No current facility-administered medications for this visit.     Allergies: No Known Allergies   Physical Exam: Blood pressure (!) 135/92, pulse 90, temperature 98.3 F (36.8 C), temperature source Temporal, resp. rate 19, height 6\' 2"  (1.88 m), weight 288 lb 14.4 oz (131 kg), SpO2 98 %.   ECOG: 1    General appearance: Comfortable appearing without any discomfort Head: Normocephalic without any trauma Oropharynx: Mucous membranes are moist and pink without any thrush or ulcers. Eyes: Pupils are equal and round reactive to light. Lymph nodes: No cervical, supraclavicular, inguinal or axillary lymphadenopathy.   Heart:regular rate and rhythm.  S1 and S2 without leg edema. Lung: Clear without any rhonchi or wheezes.  No dullness to percussion. Abdomin: Soft, nontender, nondistended with good bowel sounds.  No hepatosplenomegaly. Musculoskeletal: No joint deformity or effusion.  Full range of motion noted. Neurological: No deficits noted on motor, sensory and deep tendon reflex exam. Skin: No petechial rash or dryness.  Appeared moist.       Lab Results: Lab Results  Component Value Date   WBC 6.6 03/19/2021   HGB 16.1 03/19/2021   HCT 44.5 03/19/2021   MCV 84.8 03/19/2021   PLT 187 03/19/2021     Chemistry      Component Value  Date/Time   NA 132 (L) 03/19/2021 0923   NA 139 09/10/2020 1428   K 4.2 03/19/2021 0923   CL 96 (L) 03/19/2021 0923   CO2 26 03/19/2021 0923   BUN 39 (H) 03/19/2021 0923   BUN 16 09/10/2020 1428   CREATININE 2.25 (H) 03/19/2021 0923   CREATININE 1.05 11/12/2016 1051      Component Value Date/Time   CALCIUM 9.5 03/19/2021 0923   ALKPHOS 92 03/19/2021 0923   AST 15 03/19/2021 0923   ALT 21 03/19/2021 0923   BILITOT 0.8 03/19/2021 0923        Impression and Plan:  67 year old with:     1.  Bladder cancer diagnosed in November 2022.  He was found to have T2N0 high-grade urothelial carcinoma.   He is currently receiving definitive therapy with radiation and received 1 cycle of cisplatin and MD follow-up in the next 2 to 3 weeks with worsening kidney function and drop in his creatinine clearance to 30 cc/min.  Risks and benefits of switching to carboplatin or gemcitabine were reviewed at this time.  Complications include nausea, fatigue and worsening kidney function were discussed.  He is agreeable to continue and will monitor his kidney function at this time.   2.  IV access: Port-A-Cath option has been deferred with peripheral veins are currently in use.   3.  Antiemetics: Compazine is available doing without any nausea or vomiting.   4.  Renal function surveillance: Kidney function decline after cisplatin therapy and will continue to monitor on carboplatin therapy.   5.  Goals of care: Therapy remains curative and aggressive measures are warranted.  6.  Hyperglycemia: Recommended follow-up with his primary care physician to adjust his diabetes regimen.   7.  Follow-up: We will continue to follow weekly and MD follow-up in the next 2 to 3 weeks.   30  minutes were dedicated to this visit.  The time was spent on reviewing laboratory data, treatment choices and outlining future plan of care review.          Darius Button, MD 2/14/20238:53 AM

## 2021-03-27 ENCOUNTER — Other Ambulatory Visit: Payer: Self-pay

## 2021-03-27 ENCOUNTER — Ambulatory Visit: Payer: 59

## 2021-03-27 DIAGNOSIS — Z510 Encounter for antineoplastic radiation therapy: Secondary | ICD-10-CM | POA: Diagnosis not present

## 2021-03-28 ENCOUNTER — Ambulatory Visit: Payer: 59

## 2021-03-28 DIAGNOSIS — Z510 Encounter for antineoplastic radiation therapy: Secondary | ICD-10-CM | POA: Diagnosis not present

## 2021-03-29 ENCOUNTER — Other Ambulatory Visit: Payer: Self-pay

## 2021-03-29 ENCOUNTER — Ambulatory Visit
Admission: RE | Admit: 2021-03-29 | Discharge: 2021-03-29 | Disposition: A | Discharge status: Home / Self Care | Payer: 59 | Source: Ambulatory Visit | Attending: Radiation Oncology | Admitting: Radiation Oncology

## 2021-03-29 DIAGNOSIS — Z510 Encounter for antineoplastic radiation therapy: Secondary | ICD-10-CM | POA: Diagnosis not present

## 2021-04-01 ENCOUNTER — Ambulatory Visit: Payer: 59

## 2021-04-01 ENCOUNTER — Ambulatory Visit
Admission: RE | Admit: 2021-04-01 | Discharge: 2021-04-01 | Disposition: A | Discharge status: Home / Self Care | Payer: 59 | Source: Ambulatory Visit | Attending: Radiation Oncology | Admitting: Radiation Oncology

## 2021-04-01 DIAGNOSIS — Z510 Encounter for antineoplastic radiation therapy: Secondary | ICD-10-CM | POA: Diagnosis not present

## 2021-04-02 ENCOUNTER — Inpatient Hospital Stay: Payer: 59

## 2021-04-02 ENCOUNTER — Ambulatory Visit
Admission: RE | Admit: 2021-04-02 | Discharge: 2021-04-02 | Disposition: A | Discharge status: Home / Self Care | Payer: 59 | Source: Ambulatory Visit | Attending: Radiation Oncology | Admitting: Radiation Oncology

## 2021-04-02 ENCOUNTER — Other Ambulatory Visit: Payer: Self-pay

## 2021-04-02 VITALS — BP 110/89 | HR 87 | Temp 98.4°F | Resp 18 | Ht 74.0 in | Wt 280.8 lb

## 2021-04-02 DIAGNOSIS — Z510 Encounter for antineoplastic radiation therapy: Secondary | ICD-10-CM | POA: Diagnosis not present

## 2021-04-02 DIAGNOSIS — C672 Malignant neoplasm of lateral wall of bladder: Secondary | ICD-10-CM

## 2021-04-02 DIAGNOSIS — E1100 Type 2 diabetes mellitus with hyperosmolarity without nonketotic hyperglycemic-hyperosmolar coma (NKHHC): Secondary | ICD-10-CM

## 2021-04-02 DIAGNOSIS — C679 Malignant neoplasm of bladder, unspecified: Secondary | ICD-10-CM

## 2021-04-02 LAB — CBC WITH DIFFERENTIAL (CANCER CENTER ONLY)
Abs Immature Granulocytes: 0.02 10*3/uL (ref 0.00–0.07)
Basophils Absolute: 0.1 10*3/uL (ref 0.0–0.1)
Basophils Relative: 1 %
Eosinophils Absolute: 0.7 10*3/uL — ABNORMAL HIGH (ref 0.0–0.5)
Eosinophils Relative: 8 %
HCT: 40.9 % (ref 39.0–52.0)
Hemoglobin: 14.9 g/dL (ref 13.0–17.0)
Immature Granulocytes: 0 %
Lymphocytes Relative: 12 %
Lymphs Abs: 1 10*3/uL (ref 0.7–4.0)
MCH: 31 pg (ref 26.0–34.0)
MCHC: 36.4 g/dL — ABNORMAL HIGH (ref 30.0–36.0)
MCV: 85 fL (ref 80.0–100.0)
Monocytes Absolute: 0.8 10*3/uL (ref 0.1–1.0)
Monocytes Relative: 9 %
Neutro Abs: 5.9 10*3/uL (ref 1.7–7.7)
Neutrophils Relative %: 70 %
Platelet Count: 120 10*3/uL — ABNORMAL LOW (ref 150–400)
RBC: 4.81 MIL/uL (ref 4.22–5.81)
RDW: 11.9 % (ref 11.5–15.5)
WBC Count: 8.5 10*3/uL (ref 4.0–10.5)
nRBC: 0 % (ref 0.0–0.2)

## 2021-04-02 LAB — CMP (CANCER CENTER ONLY)
ALT: 14 U/L (ref 0–44)
AST: 10 U/L — ABNORMAL LOW (ref 15–41)
Albumin: 3.6 g/dL (ref 3.5–5.0)
Alkaline Phosphatase: 89 U/L (ref 38–126)
Anion gap: 9 (ref 5–15)
BUN: 35 mg/dL — ABNORMAL HIGH (ref 8–23)
CO2: 26 mmol/L (ref 22–32)
Calcium: 8.9 mg/dL (ref 8.9–10.3)
Chloride: 94 mmol/L — ABNORMAL LOW (ref 98–111)
Creatinine: 2.04 mg/dL — ABNORMAL HIGH (ref 0.61–1.24)
GFR, Estimated: 35 mL/min — ABNORMAL LOW (ref >60)
Glucose, Bld: 555 mg/dL (ref 70–99)
Potassium: 4.4 mmol/L (ref 3.5–5.1)
Sodium: 129 mmol/L — ABNORMAL LOW (ref 135–145)
Total Bilirubin: 1.1 mg/dL (ref 0.3–1.2)
Total Protein: 6.8 g/dL (ref 6.5–8.1)

## 2021-04-02 MED ORDER — SODIUM CHLORIDE 0.9 % IV SOLN
75.0000 mg/m2 | Freq: Once | INTRAVENOUS | Status: AC
  Administered 2021-04-02: 190 mg via INTRAVENOUS
  Filled 2021-04-02: qty 5

## 2021-04-02 MED ORDER — SODIUM CHLORIDE 0.9 % IV SOLN: Freq: Once | INTRAVENOUS | Status: AC

## 2021-04-02 MED ORDER — PROCHLORPERAZINE MALEATE 10 MG PO TABS
10.0000 mg | ORAL_TABLET | Freq: Once | ORAL | Status: AC
  Administered 2021-04-02: 10 mg via ORAL
  Filled 2021-04-02: qty 1

## 2021-04-02 MED ORDER — INSULIN ASPART 100 UNIT/ML IJ SOLN
10.0000 [IU] | Freq: Once | INTRAMUSCULAR | Status: AC
  Administered 2021-04-02: 10 [IU] via SUBCUTANEOUS
  Filled 2021-04-02: qty 1

## 2021-04-02 MED ORDER — SODIUM CHLORIDE 0.9 % IV SOLN: Freq: Once | INTRAVENOUS | Status: DC

## 2021-04-02 NOTE — Patient Instructions (Signed)
[facility]  Discharge Instructions: Thank you for choosing [facility] to provide your oncology and hematology care.   If you have a lab appointment with the [facility], please go directly to the [facility] and check in at the registration area.   Wear comfortable clothing and clothing appropriate for easy access to any Portacath or PICC line.   We strive to give you quality time with your provider. You may need to reschedule your appointment if you arrive late (15 or more minutes).  Arriving late affects you and other patients whose appointments are after yours.  Also, if you miss three or more appointments without notifying the office, you may be dismissed from the clinic at the providers discretion.      For prescription refill requests, have your pharmacy contact our office and allow 72 hours for refills to be completed.    Today you received the following chemotherapy and/or immunotherapy agents   Gemcitabine (Gemzar)    To help prevent nausea and vomiting after your treatment, we encourage you to take your nausea medication as directed.  BELOW ARE SYMPTOMS THAT SHOULD BE REPORTED IMMEDIATELY: *FEVER GREATER THAN 100.4 F (38 C) OR HIGHER *CHILLS OR SWEATING *NAUSEA AND VOMITING THAT IS NOT CONTROLLED WITH YOUR NAUSEA MEDICATION *UNUSUAL SHORTNESS OF BREATH *UNUSUAL BRUISING OR BLEEDING *URINARY PROBLEMS (pain or burning when urinating, or frequent urination) *BOWEL PROBLEMS (unusual diarrhea, constipation, pain near the anus) TENDERNESS IN MOUTH AND THROAT WITH OR WITHOUT PRESENCE OF ULCERS (sore throat, sores in mouth, or a toothache) UNUSUAL RASH, SWELLING OR PAIN  UNUSUAL VAGINAL DISCHARGE OR ITCHING   Items with * indicate a potential emergency and should be followed up as soon as possible or go to the Emergency Department if any problems should occur.  Please show the CHEMOTHERAPY ALERT CARD or IMMUNOTHERAPY ALERT CARD at  check-in to the Emergency Department and triage nurse.  Should you have questions after your visit or need to cancel or reschedule your appointment, please contact [facility]  Dept: [phone]  and follow the prompts.  Office hours are 8:00 a.m. to 4:30 p.m. Monday - Friday. Please note that voicemails left after 4:00 p.m. may not be returned until the following business day.  We are closed weekends and major holidays. You have access to a nurse at all times for urgent questions. Please call the main number to the clinic Dept: [phone] and follow the prompts.   For any non-urgent questions, you may also contact your provider using MyChart. We now offer e-Visits for anyone 55 and older to request care online for non-urgent symptoms. For details visit [email or web address].   Also download the MyChart app! Go to the app store, search "MyChart", open the app, select [facility], and log in with your MyChart username and password.  Due to Covid, a mask is required upon entering the hospital/clinic. If you do not have a mask, one will be given to you upon arrival. For doctor visits, patients may have 1 support person aged 16 or older with them. For treatment visits, patients cannot have anyone with them due to current Covid guidelines and our immunocompromised population.   Gemcitabine (Gemzar) injection What is this medication? GEMCITABINE (jem SYE ta been) is a chemotherapy drug. This medicine is used to treat many types of cancer like breast cancer, lung cancer, pancreatic cancer, and ovarian cancer. This medicine may be used for other purposes; ask your health care  provider or pharmacist if you have questions. COMMON BRAND NAME(S): Gemzar, Infugem What should I tell my care team before I take this medication? They need to know if you have any of these conditions: blood disorders infection kidney disease liver disease lung or breathing disease, like  asthma recent or ongoing radiation therapy an unusual or allergic reaction to gemcitabine, other chemotherapy, other medicines, foods, dyes, or preservatives pregnant or trying to get pregnant breast-feeding How should I use this medication? This drug is given as an infusion into a vein. It is administered in a hospital or clinic by a specially trained health care professional. Talk to your pediatrician regarding the use of this medicine in children. Special care may be needed. Overdosage: If you think you have taken too much of this medicine contact a poison control center or emergency room at once. NOTE: This medicine is only for you. Do not share this medicine with others. What if I miss a dose? It is important not to miss your dose. Call your doctor or health care professional if you are unable to keep an appointment. What may interact with this medication? medicines to increase blood counts like filgrastim, pegfilgrastim, sargramostim some other chemotherapy drugs like cisplatin vaccines Talk to your doctor or health care professional before taking any of these medicines: acetaminophen aspirin ibuprofen ketoprofen naproxen This list may not describe all possible interactions. Give your health care provider a list of all the medicines, herbs, non-prescription drugs, or dietary supplements you use. Also tell them if you smoke, drink alcohol, or use illegal drugs. Some items may interact with your medicine. What should I watch for while using this medication? Visit your doctor for checks on your progress. This drug may make you feel generally unwell. This is not uncommon, as chemotherapy can affect healthy cells as well as cancer cells. Report any side effects. Continue your course of treatment even though you feel ill unless your doctor tells you to stop. In some cases, you may be given additional medicines to help with side effects. Follow all directions for their use. Call your doctor  or health care professional for advice if you get a fever, chills or sore throat, or other symptoms of a cold or flu. Do not treat yourself. This drug decreases your body's ability to fight infections. Try to avoid being around people who are sick. This medicine may increase your risk to bruise or bleed. Call your doctor or health care professional if you notice any unusual bleeding. Be careful brushing and flossing your teeth or using a toothpick because you may get an infection or bleed more easily. If you have any dental work done, tell your dentist you are receiving this medicine. Avoid taking products that contain aspirin, acetaminophen, ibuprofen, naproxen, or ketoprofen unless instructed by your doctor. These medicines may hide a fever. Do not become pregnant while taking this medicine or for 6 months after stopping it. Women should inform their doctor if they wish to become pregnant or think they might be pregnant. Men should not father a child while taking this medicine and for 3 months after stopping it. There is a potential for serious side effects to an unborn child. Talk to your health care professional or pharmacist for more information. Do not breast-feed an infant while taking this medicine or for at least 1 week after stopping it. Men should inform their doctors if they wish to father a child. This medicine may lower sperm counts. Talk with your doctor or  health care professional if you are concerned about your fertility. What side effects may I notice from receiving this medication? Side effects that you should report to your doctor or health care professional as soon as possible: allergic reactions like skin rash, itching or hives, swelling of the face, lips, or tongue breathing problems pain, redness, or irritation at site where injected signs and symptoms of a dangerous change in heartbeat or heart rhythm like chest pain; dizziness; fast or irregular heartbeat; palpitations; feeling  faint or lightheaded, falls; breathing problems signs of decreased platelets or bleeding - bruising, pinpoint red spots on the skin, black, tarry stools, blood in the urine signs of decreased red blood cells - unusually weak or tired, feeling faint or lightheaded, falls signs of infection - fever or chills, cough, sore throat, pain or difficulty passing urine signs and symptoms of kidney injury like trouble passing urine or change in the amount of urine signs and symptoms of liver injury like dark yellow or brown urine; general ill feeling or flu-like symptoms; light-colored stools; loss of appetite; nausea; right upper belly pain; unusually weak or tired; yellowing of the eyes or skin swelling of ankles, feet, hands Side effects that usually do not require medical attention (report to your doctor or health care professional if they continue or are bothersome): constipation diarrhea hair loss loss of appetite nausea rash vomiting This list may not describe all possible side effects. Call your doctor for medical advice about side effects. You may report side effects to FDA at 1-800-FDA-1088. Where should I keep my medication? This drug is given in a hospital or clinic and will not be stored at home. NOTE: This sheet is a summary. It may not cover all possible information. If you have questions about this medicine, talk to your doctor, pharmacist, or health care provider.  2022 Elsevier/Gold Standard (2017-04-22 00:00:00)

## 2021-04-02 NOTE — Progress Notes (Signed)
OK to treat  D1/C1 Gemcitabine W/ Creatine 2.04 per Dr. Alen Blew.

## 2021-04-02 NOTE — Progress Notes (Signed)
CRITICAL VALUE STICKER  CRITICAL VALUE: Glucose 555  RECEIVER (on-site recipient of call): Velna Ochs RN  DATE & TIME NOTIFIED: 04/02/21 @ 1013  MESSENGER (representative from lab): Hilary  MD NOTIFIED: Alen Blew  TIME OF NOTIFICATION: 1015  RESPONSE:  Order for humalog 10 units to be given in infusion

## 2021-04-03 ENCOUNTER — Telehealth: Payer: Self-pay | Admitting: *Deleted

## 2021-04-03 ENCOUNTER — Other Ambulatory Visit: Payer: Self-pay | Admitting: *Deleted

## 2021-04-03 ENCOUNTER — Ambulatory Visit
Admission: RE | Admit: 2021-04-03 | Discharge: 2021-04-03 | Disposition: A | Discharge status: Home / Self Care | Payer: 59 | Source: Ambulatory Visit | Attending: Radiation Oncology | Admitting: Radiation Oncology

## 2021-04-03 DIAGNOSIS — Z510 Encounter for antineoplastic radiation therapy: Secondary | ICD-10-CM | POA: Diagnosis not present

## 2021-04-03 NOTE — Telephone Encounter (Signed)
Called pt to see how he did with his treatment.  He reports doing well except had some diarrhea last night without any warning.  He hasn't had any since.  He denies any other problems.  He discussed high glucose while he was here.  We discussed diet.  He doesn't seem to have a good understanding of what he needs to eat as a diabetic.  He doesn't check his blood sugars at home.  We spent a lot of time on this but not sure how much he is willing to change.  Suggested that he talk with one of our dieticians & he is agreeable to this.  Referral to dietician made.  He knows how to reach [place] if needed.

## 2021-04-03 NOTE — Telephone Encounter (Signed)
-----   Message from Ishmael Holter, RN sent at 04/02/2021 12:51 PM EST ----- Regarding: Dr. Alen Blew 1st tx f/u call Dr. Alen Blew 1st tx f/u call. Treatment switched to Gemzar. Tolerated well

## 2021-04-04 ENCOUNTER — Other Ambulatory Visit: Payer: Self-pay

## 2021-04-04 ENCOUNTER — Ambulatory Visit
Admission: RE | Admit: 2021-04-04 | Discharge: 2021-04-04 | Disposition: A | Discharge status: Home / Self Care | Payer: 59 | Source: Ambulatory Visit | Attending: Radiation Oncology | Admitting: Radiation Oncology

## 2021-04-04 DIAGNOSIS — Z510 Encounter for antineoplastic radiation therapy: Secondary | ICD-10-CM | POA: Diagnosis not present

## 2021-04-05 ENCOUNTER — Ambulatory Visit
Admission: RE | Admit: 2021-04-05 | Discharge: 2021-04-05 | Disposition: A | Discharge status: Home / Self Care | Payer: 59 | Source: Ambulatory Visit | Attending: Radiation Oncology | Admitting: Radiation Oncology

## 2021-04-05 DIAGNOSIS — Z510 Encounter for antineoplastic radiation therapy: Secondary | ICD-10-CM | POA: Diagnosis not present

## 2021-04-08 ENCOUNTER — Other Ambulatory Visit: Payer: Self-pay

## 2021-04-08 ENCOUNTER — Ambulatory Visit
Admission: RE | Admit: 2021-04-08 | Discharge: 2021-04-08 | Disposition: A | Discharge status: Home / Self Care | Payer: 59 | Source: Ambulatory Visit | Attending: Radiation Oncology | Admitting: Radiation Oncology

## 2021-04-08 DIAGNOSIS — Z510 Encounter for antineoplastic radiation therapy: Secondary | ICD-10-CM | POA: Diagnosis not present

## 2021-04-09 ENCOUNTER — Inpatient Hospital Stay: Payer: 59

## 2021-04-09 ENCOUNTER — Inpatient Hospital Stay: Payer: 59 | Admitting: Dietician

## 2021-04-09 ENCOUNTER — Ambulatory Visit
Admission: RE | Admit: 2021-04-09 | Discharge: 2021-04-09 | Disposition: A | Discharge status: Home / Self Care | Payer: 59 | Source: Ambulatory Visit | Attending: Radiation Oncology | Admitting: Radiation Oncology

## 2021-04-09 ENCOUNTER — Ambulatory Visit: Payer: 59

## 2021-04-09 DIAGNOSIS — C679 Malignant neoplasm of bladder, unspecified: Secondary | ICD-10-CM

## 2021-04-09 DIAGNOSIS — Z510 Encounter for antineoplastic radiation therapy: Secondary | ICD-10-CM | POA: Diagnosis not present

## 2021-04-09 LAB — CMP (CANCER CENTER ONLY)
ALT: 16 U/L (ref 0–44)
AST: 12 U/L — ABNORMAL LOW (ref 15–41)
Albumin: 3.4 g/dL — ABNORMAL LOW (ref 3.5–5.0)
Alkaline Phosphatase: 82 U/L (ref 38–126)
Anion gap: 7 (ref 5–15)
BUN: 26 mg/dL — ABNORMAL HIGH (ref 8–23)
CO2: 26 mmol/L (ref 22–32)
Calcium: 9 mg/dL (ref 8.9–10.3)
Chloride: 99 mmol/L (ref 98–111)
Creatinine: 1.67 mg/dL — ABNORMAL HIGH (ref 0.61–1.24)
GFR, Estimated: 45 mL/min — ABNORMAL LOW (ref >60)
Glucose, Bld: 361 mg/dL — ABNORMAL HIGH (ref 70–99)
Potassium: 3.9 mmol/L (ref 3.5–5.1)
Sodium: 132 mmol/L — ABNORMAL LOW (ref 135–145)
Total Bilirubin: 0.8 mg/dL (ref 0.3–1.2)
Total Protein: 6.7 g/dL (ref 6.5–8.1)

## 2021-04-09 LAB — CBC WITH DIFFERENTIAL (CANCER CENTER ONLY)
Abs Immature Granulocytes: 0.01 10*3/uL (ref 0.00–0.07)
Basophils Absolute: 0 10*3/uL (ref 0.0–0.1)
Basophils Relative: 1 %
Eosinophils Absolute: 0.3 10*3/uL (ref 0.0–0.5)
Eosinophils Relative: 11 %
HCT: 35.1 % — ABNORMAL LOW (ref 39.0–52.0)
Hemoglobin: 12.5 g/dL — ABNORMAL LOW (ref 13.0–17.0)
Immature Granulocytes: 0 %
Lymphocytes Relative: 21 %
Lymphs Abs: 0.7 10*3/uL (ref 0.7–4.0)
MCH: 30.4 pg (ref 26.0–34.0)
MCHC: 35.6 g/dL (ref 30.0–36.0)
MCV: 85.4 fL (ref 80.0–100.0)
Monocytes Absolute: 0.3 10*3/uL (ref 0.1–1.0)
Monocytes Relative: 11 %
Neutro Abs: 1.8 10*3/uL (ref 1.7–7.7)
Neutrophils Relative %: 56 %
Platelet Count: 51 10*3/uL — ABNORMAL LOW (ref 150–400)
RBC: 4.11 MIL/uL — ABNORMAL LOW (ref 4.22–5.81)
RDW: 11.9 % (ref 11.5–15.5)
WBC Count: 3.1 10*3/uL — ABNORMAL LOW (ref 4.0–10.5)
nRBC: 0 % (ref 0.0–0.2)

## 2021-04-09 NOTE — Progress Notes (Signed)
Per Dr. Alen Blew treatment is held today due to low platelets. Patient advised and discharged. Patient provided with schedule for next appointments.

## 2021-04-10 ENCOUNTER — Other Ambulatory Visit: Payer: Self-pay

## 2021-04-10 ENCOUNTER — Ambulatory Visit
Admission: RE | Admit: 2021-04-10 | Discharge: 2021-04-10 | Disposition: A | Discharge status: Home / Self Care | Payer: 59 | Source: Ambulatory Visit | Attending: Radiation Oncology | Admitting: Radiation Oncology

## 2021-04-10 DIAGNOSIS — C672 Malignant neoplasm of lateral wall of bladder: Secondary | ICD-10-CM | POA: Insufficient documentation

## 2021-04-10 DIAGNOSIS — Z510 Encounter for antineoplastic radiation therapy: Secondary | ICD-10-CM | POA: Diagnosis present

## 2021-04-11 ENCOUNTER — Ambulatory Visit
Admission: RE | Admit: 2021-04-11 | Discharge: 2021-04-11 | Disposition: A | Discharge status: Home / Self Care | Payer: 59 | Source: Ambulatory Visit | Attending: Radiation Oncology | Admitting: Radiation Oncology

## 2021-04-11 DIAGNOSIS — Z510 Encounter for antineoplastic radiation therapy: Secondary | ICD-10-CM | POA: Diagnosis not present

## 2021-04-12 ENCOUNTER — Ambulatory Visit
Admission: RE | Admit: 2021-04-12 | Discharge: 2021-04-12 | Disposition: A | Discharge status: Home / Self Care | Payer: 59 | Source: Ambulatory Visit | Attending: Radiation Oncology | Admitting: Radiation Oncology

## 2021-04-12 ENCOUNTER — Other Ambulatory Visit: Payer: Self-pay

## 2021-04-12 DIAGNOSIS — Z510 Encounter for antineoplastic radiation therapy: Secondary | ICD-10-CM | POA: Diagnosis not present

## 2021-04-15 ENCOUNTER — Ambulatory Visit
Admission: RE | Admit: 2021-04-15 | Discharge: 2021-04-15 | Disposition: A | Discharge status: Home / Self Care | Payer: 59 | Source: Ambulatory Visit | Attending: Radiation Oncology | Admitting: Radiation Oncology

## 2021-04-15 ENCOUNTER — Other Ambulatory Visit: Payer: Self-pay

## 2021-04-15 DIAGNOSIS — Z510 Encounter for antineoplastic radiation therapy: Secondary | ICD-10-CM | POA: Diagnosis not present

## 2021-04-16 ENCOUNTER — Inpatient Hospital Stay: Payer: 59 | Admitting: Dietician

## 2021-04-16 ENCOUNTER — Inpatient Hospital Stay (HOSPITAL_BASED_OUTPATIENT_CLINIC_OR_DEPARTMENT_OTHER): Payer: 59 | Admitting: Oncology

## 2021-04-16 ENCOUNTER — Inpatient Hospital Stay: Payer: 59

## 2021-04-16 ENCOUNTER — Ambulatory Visit
Admission: RE | Admit: 2021-04-16 | Discharge: 2021-04-16 | Disposition: A | Discharge status: Home / Self Care | Payer: 59 | Source: Ambulatory Visit | Attending: Radiation Oncology | Admitting: Radiation Oncology

## 2021-04-16 VITALS — BP 113/79 | HR 97 | Temp 97.8°F | Resp 20 | Ht 74.0 in | Wt 283.6 lb

## 2021-04-16 DIAGNOSIS — C672 Malignant neoplasm of lateral wall of bladder: Secondary | ICD-10-CM | POA: Insufficient documentation

## 2021-04-16 DIAGNOSIS — Z5111 Encounter for antineoplastic chemotherapy: Secondary | ICD-10-CM | POA: Insufficient documentation

## 2021-04-16 DIAGNOSIS — Z510 Encounter for antineoplastic radiation therapy: Secondary | ICD-10-CM | POA: Diagnosis not present

## 2021-04-16 DIAGNOSIS — Z79899 Other long term (current) drug therapy: Secondary | ICD-10-CM | POA: Insufficient documentation

## 2021-04-16 DIAGNOSIS — C679 Malignant neoplasm of bladder, unspecified: Secondary | ICD-10-CM

## 2021-04-16 LAB — CMP (CANCER CENTER ONLY)
ALT: 18 U/L (ref 0–44)
AST: 12 U/L — ABNORMAL LOW (ref 15–41)
Albumin: 3.5 g/dL (ref 3.5–5.0)
Alkaline Phosphatase: 73 U/L (ref 38–126)
Anion gap: 7 (ref 5–15)
BUN: 27 mg/dL — ABNORMAL HIGH (ref 8–23)
CO2: 26 mmol/L (ref 22–32)
Calcium: 8.8 mg/dL — ABNORMAL LOW (ref 8.9–10.3)
Chloride: 103 mmol/L (ref 98–111)
Creatinine: 1.53 mg/dL — ABNORMAL HIGH (ref 0.61–1.24)
GFR, Estimated: 50 mL/min — ABNORMAL LOW (ref >60)
Glucose, Bld: 321 mg/dL — ABNORMAL HIGH (ref 70–99)
Potassium: 4.1 mmol/L (ref 3.5–5.1)
Sodium: 136 mmol/L (ref 135–145)
Total Bilirubin: 0.6 mg/dL (ref 0.3–1.2)
Total Protein: 6.6 g/dL (ref 6.5–8.1)

## 2021-04-16 LAB — CBC WITH DIFFERENTIAL (CANCER CENTER ONLY)
Abs Immature Granulocytes: 0.01 10*3/uL (ref 0.00–0.07)
Basophils Absolute: 0 10*3/uL (ref 0.0–0.1)
Basophils Relative: 1 %
Eosinophils Absolute: 0.4 10*3/uL (ref 0.0–0.5)
Eosinophils Relative: 14 %
HCT: 34.3 % — ABNORMAL LOW (ref 39.0–52.0)
Hemoglobin: 12.2 g/dL — ABNORMAL LOW (ref 13.0–17.0)
Immature Granulocytes: 0 %
Lymphocytes Relative: 18 %
Lymphs Abs: 0.6 10*3/uL — ABNORMAL LOW (ref 0.7–4.0)
MCH: 30.5 pg (ref 26.0–34.0)
MCHC: 35.6 g/dL (ref 30.0–36.0)
MCV: 85.8 fL (ref 80.0–100.0)
Monocytes Absolute: 0.4 10*3/uL (ref 0.1–1.0)
Monocytes Relative: 13 %
Neutro Abs: 1.7 10*3/uL (ref 1.7–7.7)
Neutrophils Relative %: 54 %
Platelet Count: 144 10*3/uL — ABNORMAL LOW (ref 150–400)
RBC: 4 MIL/uL — ABNORMAL LOW (ref 4.22–5.81)
RDW: 12.4 % (ref 11.5–15.5)
WBC Count: 3.1 10*3/uL — ABNORMAL LOW (ref 4.0–10.5)
nRBC: 0 % (ref 0.0–0.2)

## 2021-04-16 MED ORDER — SODIUM CHLORIDE 0.9 % IV SOLN
50.0000 mg/m2 | Freq: Once | INTRAVENOUS | Status: AC
  Administered 2021-04-16: 114 mg via INTRAVENOUS
  Filled 2021-04-16: qty 3

## 2021-04-16 MED ORDER — PROCHLORPERAZINE MALEATE 10 MG PO TABS
10.0000 mg | ORAL_TABLET | Freq: Once | ORAL | Status: AC
  Administered 2021-04-16: 10 mg via ORAL
  Filled 2021-04-16: qty 1

## 2021-04-16 MED ORDER — SODIUM CHLORIDE 0.9 % IV SOLN: Freq: Once | INTRAVENOUS | Status: AC

## 2021-04-16 MED ORDER — SODIUM CHLORIDE 0.9 % IV SOLN: Freq: Once | INTRAVENOUS | Status: DC

## 2021-04-16 NOTE — Progress Notes (Signed)
Ok to treat today per Dr. Alen Blew with elevated Scr.  ?

## 2021-04-16 NOTE — Patient Instructions (Signed)
[facility]  Discharge Instructions: Thank you for choosing [facility] to provide your oncology and hematology care.   If you have a lab appointment with the [facility], please go directly to the [facility] and check in at the registration area.   Wear comfortable clothing and clothing appropriate for easy access to any Portacath or PICC line.   We strive to give you quality time with your provider. You may need to reschedule your appointment if you arrive late (15 or more minutes).  Arriving late affects you and other patients whose appointments are after yours.  Also, if you miss three or more appointments without notifying the office, you may be dismissed from the clinic at the providers discretion.      For prescription refill requests, have your pharmacy contact our office and allow 72 hours for refills to be completed.    Today you received the following chemotherapy and/or immunotherapy agents   Gemcitabine (Gemzar)    To help prevent nausea and vomiting after your treatment, we encourage you to take your nausea medication as directed.  BELOW ARE SYMPTOMS THAT SHOULD BE REPORTED IMMEDIATELY: *FEVER GREATER THAN 100.4 F (38 C) OR HIGHER *CHILLS OR SWEATING *NAUSEA AND VOMITING THAT IS NOT CONTROLLED WITH YOUR NAUSEA MEDICATION *UNUSUAL SHORTNESS OF BREATH *UNUSUAL BRUISING OR BLEEDING *URINARY PROBLEMS (pain or burning when urinating, or frequent urination) *BOWEL PROBLEMS (unusual diarrhea, constipation, pain near the anus) TENDERNESS IN MOUTH AND THROAT WITH OR WITHOUT PRESENCE OF ULCERS (sore throat, sores in mouth, or a toothache) UNUSUAL RASH, SWELLING OR PAIN  UNUSUAL VAGINAL DISCHARGE OR ITCHING   Items with * indicate a potential emergency and should be followed up as soon as possible or go to the Emergency Department if any problems should occur.  Please show the CHEMOTHERAPY ALERT CARD or IMMUNOTHERAPY ALERT CARD at  check-in to the Emergency Department and triage nurse.  Should you have questions after your visit or need to cancel or reschedule your appointment, please contact [facility]  Dept: [phone]  and follow the prompts.  Office hours are 8:00 a.m. to 4:30 p.m. Monday - Friday. Please note that voicemails left after 4:00 p.m. may not be returned until the following business day.  We are closed weekends and major holidays. You have access to a nurse at all times for urgent questions. Please call the main number to the clinic Dept: [phone] and follow the prompts.   For any non-urgent questions, you may also contact your provider using MyChart. We now offer e-Visits for anyone 55 and older to request care online for non-urgent symptoms. For details visit [email or web address].   Also download the MyChart app! Go to the app store, search "MyChart", open the app, select [facility], and log in with your MyChart username and password.  Due to Covid, a mask is required upon entering the hospital/clinic. If you do not have a mask, one will be given to you upon arrival. For doctor visits, patients may have 1 support person aged 16 or older with them. For treatment visits, patients cannot have anyone with them due to current Covid guidelines and our immunocompromised population.   Gemcitabine (Gemzar) injection What is this medication? GEMCITABINE (jem SYE ta been) is a chemotherapy drug. This medicine is used to treat many types of cancer like breast cancer, lung cancer, pancreatic cancer, and ovarian cancer. This medicine may be used for other purposes; ask your health care  provider or pharmacist if you have questions. COMMON BRAND NAME(S): Gemzar, Infugem What should I tell my care team before I take this medication? They need to know if you have any of these conditions: blood disorders infection kidney disease liver disease lung or breathing disease, like  asthma recent or ongoing radiation therapy an unusual or allergic reaction to gemcitabine, other chemotherapy, other medicines, foods, dyes, or preservatives pregnant or trying to get pregnant breast-feeding How should I use this medication? This drug is given as an infusion into a vein. It is administered in a hospital or clinic by a specially trained health care professional. Talk to your pediatrician regarding the use of this medicine in children. Special care may be needed. Overdosage: If you think you have taken too much of this medicine contact a poison control center or emergency room at once. NOTE: This medicine is only for you. Do not share this medicine with others. What if I miss a dose? It is important not to miss your dose. Call your doctor or health care professional if you are unable to keep an appointment. What may interact with this medication? medicines to increase blood counts like filgrastim, pegfilgrastim, sargramostim some other chemotherapy drugs like cisplatin vaccines Talk to your doctor or health care professional before taking any of these medicines: acetaminophen aspirin ibuprofen ketoprofen naproxen This list may not describe all possible interactions. Give your health care provider a list of all the medicines, herbs, non-prescription drugs, or dietary supplements you use. Also tell them if you smoke, drink alcohol, or use illegal drugs. Some items may interact with your medicine. What should I watch for while using this medication? Visit your doctor for checks on your progress. This drug may make you feel generally unwell. This is not uncommon, as chemotherapy can affect healthy cells as well as cancer cells. Report any side effects. Continue your course of treatment even though you feel ill unless your doctor tells you to stop. In some cases, you may be given additional medicines to help with side effects. Follow all directions for their use. Call your doctor  or health care professional for advice if you get a fever, chills or sore throat, or other symptoms of a cold or flu. Do not treat yourself. This drug decreases your body's ability to fight infections. Try to avoid being around people who are sick. This medicine may increase your risk to bruise or bleed. Call your doctor or health care professional if you notice any unusual bleeding. Be careful brushing and flossing your teeth or using a toothpick because you may get an infection or bleed more easily. If you have any dental work done, tell your dentist you are receiving this medicine. Avoid taking products that contain aspirin, acetaminophen, ibuprofen, naproxen, or ketoprofen unless instructed by your doctor. These medicines may hide a fever. Do not become pregnant while taking this medicine or for 6 months after stopping it. Women should inform their doctor if they wish to become pregnant or think they might be pregnant. Men should not father a child while taking this medicine and for 3 months after stopping it. There is a potential for serious side effects to an unborn child. Talk to your health care professional or pharmacist for more information. Do not breast-feed an infant while taking this medicine or for at least 1 week after stopping it. Men should inform their doctors if they wish to father a child. This medicine may lower sperm counts. Talk with your doctor or  health care professional if you are concerned about your fertility. What side effects may I notice from receiving this medication? Side effects that you should report to your doctor or health care professional as soon as possible: allergic reactions like skin rash, itching or hives, swelling of the face, lips, or tongue breathing problems pain, redness, or irritation at site where injected signs and symptoms of a dangerous change in heartbeat or heart rhythm like chest pain; dizziness; fast or irregular heartbeat; palpitations; feeling  faint or lightheaded, falls; breathing problems signs of decreased platelets or bleeding - bruising, pinpoint red spots on the skin, black, tarry stools, blood in the urine signs of decreased red blood cells - unusually weak or tired, feeling faint or lightheaded, falls signs of infection - fever or chills, cough, sore throat, pain or difficulty passing urine signs and symptoms of kidney injury like trouble passing urine or change in the amount of urine signs and symptoms of liver injury like dark yellow or brown urine; general ill feeling or flu-like symptoms; light-colored stools; loss of appetite; nausea; right upper belly pain; unusually weak or tired; yellowing of the eyes or skin swelling of ankles, feet, hands Side effects that usually do not require medical attention (report to your doctor or health care professional if they continue or are bothersome): constipation diarrhea hair loss loss of appetite nausea rash vomiting This list may not describe all possible side effects. Call your doctor for medical advice about side effects. You may report side effects to FDA at 1-800-FDA-1088. Where should I keep my medication? This drug is given in a hospital or clinic and will not be stored at home. NOTE: This sheet is a summary. It may not cover all possible information. If you have questions about this medicine, talk to your doctor, pharmacist, or health care provider.  2022 Elsevier/Gold Standard (2017-04-22 00:00:00)

## 2021-04-16 NOTE — Progress Notes (Signed)
Hematology and Oncology Follow Up Visit ? ?[name] ?[id number] ?[birth year] 66 y.o. ?04/16/2021 8:24 AM ?Darius [name], Darius Foley, NP  ? ?Principle Diagnosis: 66 year old with T2N0 bladder cancer noted in November 2022 and found to have high-grade urothelial type. ? ?Prior Therapy: ? ?He is s/p TURBT in November 2022 and repeated in January 2023. ? ?Current therapy:  ? ?Definitive radiation therapy with weekly cisplatin projected to start on March 11, 2021.   He received carboplatin instead of cisplatin on March 26, 2021 and then switched to gemcitabine on April 02, 2021.  He is here for the next cycle of therapy. ? ?Interim History: Darius [name] for repeat evaluation.  Since the last visit, he developed pancytopenia and did not receive chemotherapy on April 09, 2021.  Since that time, he reports feeling well without any major complications.  He denies any nausea vomiting or abdominal pain.  He denies any worsening neuropathy or fatigue.  He denies any hematuria, dysuria or any recent hospitalizations. ? ? ? ? ?Medications: Reviewed without changes. ?Current Outpatient Medications  ?Medication Sig Dispense Refill  ? apixaban (ELIQUIS) 5 MG TABS tablet TAKE 1 TABLET (5 MG TOTAL) BY MOUTH 2 (TWO) TIMES DAILY. 60 tablet 0  ? atorvastatin (LIPITOR) 80 MG tablet TAKE 1 TABLET BY MOUTH AT BEDTIME. 30 tablet 6  ? ezetimibe (ZETIA) 10 MG tablet Take 1 tablet (10 mg total) by mouth daily. 90 tablet 3  ? metFORMIN (GLUCOPHAGE) 1000 MG tablet Take 1 tablet (1,000 mg total) by mouth 2 (two) times daily with food. 60 tablet 1  ? metoprolol (TOPROL-XL) 200 MG 24 hr tablet Take 1 tablet (200 mg total) by mouth daily. Take with or immediately following a meal. (Patient taking differently: Take 200 mg by mouth daily. Take with or immediately following a meal.) 90 tablet 1  ? oxybutynin (DITROPAN) 5 MG tablet Take 1 tablet (5 mg total) by mouth 2 (two) times daily as needed for bladder spasms. 10  tablet 0  ? oxyCODONE (OXY IR/ROXICODONE) 5 MG immediate release tablet Take 1 tablet  by mouth every 4 hours as needed for moderate pain. 30 tablet 0  ? prochlorperazine (COMPAZINE) 10 MG tablet Take 1 tablet (10 mg total) by mouth every 6 (six) hours as needed for nausea or vomiting. (Patient not taking: Reported on 02/21/2021) 30 tablet 0  ? sacubitril-valsartan (ENTRESTO) 97-103 MG Take 1 tablet by mouth 2 (two) times daily. (Patient taking differently: Take 1 tablet by mouth 2 (two) times daily.) 60 tablet 3  ? spironolactone (ALDACTONE) 25 MG tablet Take 1 tablet (25 mg total) by mouth daily. 40 tablet 0  ? tamsulosin (FLOMAX) 0.4 MG CAPS capsule Take 1 capsule (0.4 mg total) by mouth daily after supper. 30 capsule 3  ? ?No current facility-administered medications for this visit.  ? ? ? ?Allergies: No Known Allergies ? ? ?Physical Exam: ?Blood pressure 113/79, pulse 97, temperature 97.8 ?F (36.6 ?C), temperature source Temporal, resp. rate 20, height '6\' 2"'$  (1.88 m), weight 283 lb 9.6 oz (128.6 kg), SpO2 96 %. ? ? ? ?ECOG: 1 ? ? ? ?General appearance: Alert, awake without any distress. ?Head: Atraumatic without abnormalities ?Oropharynx: Without any thrush or ulcers. ?Eyes: No scleral icterus. ?Lymph nodes: No lymphadenopathy noted in the cervical, supraclavicular, or axillary nodes ?Heart:regular rate and rhythm, without any murmurs or gallops.   ?Lung: Clear to auscultation without any rhonchi, wheezes or dullness to percussion. ?Abdomin: Soft, nontender without any shifting  dullness or ascites. ?Musculoskeletal: No clubbing or cyanosis. ?Neurological: No motor or sensory deficits. ? ? ? ? ? ? ?Lab Results: ?Lab Results  ?Component Value Date  ? WBC 3.1 (L) 04/09/2021  ? HGB 12.5 (L) 04/09/2021  ? HCT 35.1 (L) 04/09/2021  ? MCV 85.4 04/09/2021  ? PLT 51 (L) 04/09/2021  ? ?  Chemistry   ?   ?Component Value Date/Time  ? NA 132 (L) 04/09/2021 0951  ? NA 139 09/10/2020 1428  ? K 3.9 04/09/2021 0951  ? CL 99  04/09/2021 0951  ? CO2 26 04/09/2021 0951  ? BUN 26 (H) 04/09/2021 0951  ? BUN 16 09/10/2020 1428  ? CREATININE 1.67 (H) 04/09/2021 0951  ? CREATININE 1.05 11/12/2016 1051  ?    ?Component Value Date/Time  ? CALCIUM 9.0 04/09/2021 0951  ? ALKPHOS 82 04/09/2021 0951  ? AST 12 (L) 04/09/2021 0951  ? ALT 16 04/09/2021 0951  ? BILITOT 0.8 04/09/2021 0951  ?  ? ? ? ? ?Impression and Plan: ? ?66 year old with: ?  ?  ?1.  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 ? ?He has tolerated therapy without any major complications utilizing radiation concomitantly with chemotherapy.  He did have worsening cytopenias and will require further dose modification of his gemcitabine.  Risks and benefits of continuing chemotherapy were discussed.  Complications including pancytopenia, nausea, fatigue were reiterated.  Laboratory data from [name] reviewed and showed adequate hematological parameters and will proceed with therapy.  We will adjust the gemcitabine dosing to avoid further cytopenias.  He is agreeable to proceed at this time. ? ? ?2.  IV access: Peripheral veins are currently in use without any issues. ?  ?3.  Antiemetics: No nausea or vomiting reported at this time Compazine is available to him. ?  ?4.  Renal function surveillance: His creatinine clearance is around 40 cc/min which is improved on cisplatin therapy.  Creatinine clearance continues to improve [name] currently up to 50 cc/min. ? ? ?5.  Goals of care: His disease is incurable and aggressive measures are warranted at this time. ? ?6.  Hyperglycemia: We will continue to monitor blood sugar I recommended follow-up with his primary care physician to adjust his diabetes medication.  His blood sugar is mildly elevated [name]. ?  ?7.  Follow-up: We will continue to follow weekly for chemotherapy and MD follow-up in the next 4 weeks. ?  ?30  minutes were spent on this encounter.  The time was dedicated to reviewing laboratory data,  disease status update and outlining future plan of care. ?  ?  ? ?  ? ? ?Darius Button, MD ?3/7/20238:24 AM ? ?

## 2021-04-16 NOTE — Progress Notes (Signed)
Nutrition Assessment ? ? ?Reason for Assessment: Provider request (high blood sugar) ? ? ?ASSESSMENT: 66 year old male with bladder cancer. S/p TURBT in November 2022, repeated in January 2023. He is receiving concurrent chemoradiation with Gemcitabine q7d. Patient is under the care of Dr. Alen Blew. ? ?Met with patient in infusion. Patient reports altered taste. He states foods taste like he is "eating a different brand, the flavors are a little off." Patient usually eats 3 meals/day. Meal times vary on what time he gets up (11 AM-2 PM). Patient ate country ham and 2 scrambled eggs on 2 pieces of white bread with cranberry soda for breakfast. He also likes oatmeal and grits. For lunch, patient snacks while watching television (grapes, corn chips, cookies, pie). Dinner is usually take out or heat/serve meals. Last night he had a chick fila sandwich, 2-3 fries, strawberry rhubarb pie, and watermelon. Patient reports he eats a whole pie in 2-3 days. He does not eat the crust. Patient is drinking some water. He is unable to quantify how much. Patient also drinks 100% juices (cranberry and grape) and milk. Patient reports compliant with metformin most of the time.  ? ? ?Nutrition Focused Physical Exam: deferred ? ? ?Medications: Eliquis, Lipitor, Metformin, Ditropan, Roxicodone, Compazine, Entresto,  ? ? ?Labs: glucose 321, BUN 27, Cr 1.53 ?No recent A1c for review ? ? ?Anthropometrics: Weights have decreased 6.3% (19 lbs) from usual weight in 2 months. Patient weighed 302 lb 6 oz on 02/12/21.  ? ?Height: 6'3" ?Weight: 283 lb 9.6 oz  ?UBW: 300-305 lb (June 2022-Jan 2023) ?BMI: 36.41 ? ? ? ?NUTRITION DIAGNOSIS: Food and nutrition related knowledge deficit related to poorly controlled DM2 as evidenced by hyperglycemia, dietary recall low in dietary fiber and lean proteins, high in processed foods, packaged sweets, fruit juice.  ? ? ? ?INTERVENTION:  ?Educated on strategies for better glucose control (not skipping meals,  balanced meals/snacks ideas, eating whole fruits vs drinking juice, body movement after eating, increasing foods with fiber, limiting sweets to portion size) - handout with tips provided ?Discussed foods with protein, educated to include protein source with all meals/snacks ?Discussed strategies for constipation - handout provided ?Educated on importance of taking medications as prescribed ?Contact information provided  ? ? ?MONITORING, EVALUATION, GOAL: Patient will implement dietary modifications learned to improve blood glucose ? ? ?Next Visit: No follow-up scheduled at this time. Patient encouraged to contact with nutrition questions/concerns ? ? ? ? ? ? ?

## 2021-04-17 ENCOUNTER — Other Ambulatory Visit: Payer: Self-pay

## 2021-04-17 ENCOUNTER — Ambulatory Visit
Admission: RE | Admit: 2021-04-17 | Discharge: 2021-04-17 | Disposition: A | Discharge status: Home / Self Care | Payer: 59 | Source: Ambulatory Visit | Attending: Radiation Oncology | Admitting: Radiation Oncology

## 2021-04-17 DIAGNOSIS — Z510 Encounter for antineoplastic radiation therapy: Secondary | ICD-10-CM | POA: Diagnosis not present

## 2021-04-18 ENCOUNTER — Ambulatory Visit
Admission: RE | Admit: 2021-04-18 | Discharge: 2021-04-18 | Disposition: A | Discharge status: Home / Self Care | Payer: 59 | Source: Ambulatory Visit | Attending: Radiation Oncology | Admitting: Radiation Oncology

## 2021-04-18 DIAGNOSIS — Z510 Encounter for antineoplastic radiation therapy: Secondary | ICD-10-CM | POA: Diagnosis not present

## 2021-04-19 ENCOUNTER — Ambulatory Visit
Admission: RE | Admit: 2021-04-19 | Discharge: 2021-04-19 | Disposition: A | Discharge status: Home / Self Care | Payer: 59 | Source: Ambulatory Visit | Attending: Radiation Oncology | Admitting: Radiation Oncology

## 2021-04-19 ENCOUNTER — Other Ambulatory Visit: Payer: Self-pay

## 2021-04-19 DIAGNOSIS — Z510 Encounter for antineoplastic radiation therapy: Secondary | ICD-10-CM | POA: Diagnosis not present

## 2021-04-22 ENCOUNTER — Ambulatory Visit
Admission: RE | Admit: 2021-04-22 | Discharge: 2021-04-22 | Disposition: A | Discharge status: Home / Self Care | Payer: 59 | Source: Ambulatory Visit | Attending: Radiation Oncology | Admitting: Radiation Oncology

## 2021-04-22 ENCOUNTER — Telehealth: Payer: Self-pay | Admitting: Oncology

## 2021-04-22 ENCOUNTER — Other Ambulatory Visit: Payer: Self-pay

## 2021-04-22 DIAGNOSIS — Z510 Encounter for antineoplastic radiation therapy: Secondary | ICD-10-CM | POA: Diagnosis not present

## 2021-04-22 NOTE — Telephone Encounter (Signed)
Scheduled per los, patient has been called and voicemail was left regarding upcoming appointments.  

## 2021-04-23 ENCOUNTER — Ambulatory Visit
Admission: RE | Admit: 2021-04-23 | Discharge: 2021-04-23 | Disposition: A | Discharge status: Home / Self Care | Payer: 59 | Source: Ambulatory Visit | Attending: Radiation Oncology | Admitting: Radiation Oncology

## 2021-04-23 ENCOUNTER — Inpatient Hospital Stay: Payer: 59

## 2021-04-23 ENCOUNTER — Other Ambulatory Visit: Payer: Self-pay | Admitting: Oncology

## 2021-04-23 VITALS — BP 134/82 | HR 56 | Temp 97.8°F | Resp 18 | Ht 74.0 in | Wt 281.2 lb

## 2021-04-23 DIAGNOSIS — C672 Malignant neoplasm of lateral wall of bladder: Secondary | ICD-10-CM

## 2021-04-23 DIAGNOSIS — C679 Malignant neoplasm of bladder, unspecified: Secondary | ICD-10-CM

## 2021-04-23 DIAGNOSIS — Z510 Encounter for antineoplastic radiation therapy: Secondary | ICD-10-CM | POA: Diagnosis not present

## 2021-04-23 LAB — CBC WITH DIFFERENTIAL (CANCER CENTER ONLY)
Abs Immature Granulocytes: 0.02 10*3/uL (ref 0.00–0.07)
Basophils Absolute: 0 10*3/uL (ref 0.0–0.1)
Basophils Relative: 1 %
Eosinophils Absolute: 0.3 10*3/uL (ref 0.0–0.5)
Eosinophils Relative: 10 %
HCT: 34.9 % — ABNORMAL LOW (ref 39.0–52.0)
Hemoglobin: 12.2 g/dL — ABNORMAL LOW (ref 13.0–17.0)
Immature Granulocytes: 1 %
Lymphocytes Relative: 18 %
Lymphs Abs: 0.4 10*3/uL — ABNORMAL LOW (ref 0.7–4.0)
MCH: 30.5 pg (ref 26.0–34.0)
MCHC: 35 g/dL (ref 30.0–36.0)
MCV: 87.3 fL (ref 80.0–100.0)
Monocytes Absolute: 0.5 10*3/uL (ref 0.1–1.0)
Monocytes Relative: 18 %
Neutro Abs: 1.3 10*3/uL — ABNORMAL LOW (ref 1.7–7.7)
Neutrophils Relative %: 52 %
Platelet Count: 238 10*3/uL (ref 150–400)
RBC: 4 MIL/uL — ABNORMAL LOW (ref 4.22–5.81)
RDW: 13.2 % (ref 11.5–15.5)
WBC Count: 2.5 10*3/uL — ABNORMAL LOW (ref 4.0–10.5)
nRBC: 0 % (ref 0.0–0.2)

## 2021-04-23 LAB — CMP (CANCER CENTER ONLY)
ALT: 27 U/L (ref 0–44)
AST: 19 U/L (ref 15–41)
Albumin: 3.8 g/dL (ref 3.5–5.0)
Alkaline Phosphatase: 82 U/L (ref 38–126)
Anion gap: 10 (ref 5–15)
BUN: 22 mg/dL (ref 8–23)
CO2: 25 mmol/L (ref 22–32)
Calcium: 9.4 mg/dL (ref 8.9–10.3)
Chloride: 99 mmol/L (ref 98–111)
Creatinine: 1.72 mg/dL — ABNORMAL HIGH (ref 0.61–1.24)
GFR, Estimated: 43 mL/min — ABNORMAL LOW (ref >60)
Glucose, Bld: 351 mg/dL — ABNORMAL HIGH (ref 70–99)
Potassium: 4.2 mmol/L (ref 3.5–5.1)
Sodium: 134 mmol/L — ABNORMAL LOW (ref 135–145)
Total Bilirubin: 0.5 mg/dL (ref 0.3–1.2)
Total Protein: 6.8 g/dL (ref 6.5–8.1)

## 2021-04-23 MED ORDER — SODIUM CHLORIDE 0.9 % IV SOLN: Freq: Once | INTRAVENOUS | Status: AC

## 2021-04-23 MED ORDER — PROCHLORPERAZINE MALEATE 10 MG PO TABS
10.0000 mg | ORAL_TABLET | Freq: Once | ORAL | Status: AC
  Administered 2021-04-23: 10 mg via ORAL
  Filled 2021-04-23: qty 1

## 2021-04-23 MED ORDER — SODIUM CHLORIDE 0.9 % IV SOLN
50.0000 mg/m2 | Freq: Once | INTRAVENOUS | Status: AC
  Administered 2021-04-23: 114 mg via INTRAVENOUS
  Filled 2021-04-23: qty 3

## 2021-04-23 NOTE — Patient Instructions (Signed)
[facility]  Discharge Instructions: Thank you for choosing [facility] to provide your oncology and hematology care.   If you have a lab appointment with the Cancer Center, please go directly to the Cancer Center and check in at the registration area.   Wear comfortable clothing and clothing appropriate for easy access to any Portacath or PICC line.   We strive to give you quality time with your provider. You may need to reschedule your appointment if you arrive late (15 or more minutes).  Arriving late affects you and other patients whose appointments are after yours.  Also, if you miss three or more appointments without notifying the office, you may be dismissed from the clinic at the provider's discretion.      For prescription refill requests, have your pharmacy contact our office and allow 72 hours for refills to be completed.    Today you received the following chemotherapy and/or immunotherapy agents: tecentriq      To help prevent nausea and vomiting after your treatment, we encourage you to take your nausea medication as directed.  BELOW ARE SYMPTOMS THAT SHOULD BE REPORTED IMMEDIATELY: *FEVER GREATER THAN 100.4 F (38 C) OR HIGHER *CHILLS OR SWEATING *NAUSEA AND VOMITING THAT IS NOT CONTROLLED WITH YOUR NAUSEA MEDICATION *UNUSUAL SHORTNESS OF BREATH *UNUSUAL BRUISING OR BLEEDING *URINARY PROBLEMS (pain or burning when urinating, or frequent urination) *BOWEL PROBLEMS (unusual diarrhea, constipation, pain near the anus) TENDERNESS IN MOUTH AND THROAT WITH OR WITHOUT PRESENCE OF ULCERS (sore throat, sores in mouth, or a toothache) UNUSUAL RASH, SWELLING OR PAIN  UNUSUAL VAGINAL DISCHARGE OR ITCHING   Items with * indicate a potential emergency and should be followed up as soon as possible or go to the Emergency Department if any problems should occur.  Please show the CHEMOTHERAPY ALERT CARD or IMMUNOTHERAPY ALERT CARD at check-in to  the Emergency Department and triage nurse.  Should you have questions after your visit or need to cancel or reschedule your appointment, please contact [facility]  Dept: [phone]  and follow the prompts.  Office hours are 8:00 a.m. to 4:30 p.m. Monday - Friday. Please note that voicemails left after 4:00 p.m. may not be returned until the following business day.  We are closed weekends and major holidays. You have access to a nurse at all times for urgent questions. Please call the main number to the clinic Dept: [phone] and follow the prompts.   For any non-urgent questions, you may also contact your provider using MyChart. We now offer e-Visits for anyone 18 and older to request care online for non-urgent symptoms. For details visit [email or web address].   Also download the MyChart app! Go to the app store, search "MyChart", open the app, select [facility], and log in with your MyChart username and password.  Due to Covid, a mask is required upon entering the hospital/clinic. If you do not have a mask, one will be given to you upon arrival. For doctor visits, patients may have 1 support person aged 18 or older with them. For treatment visits, patients cannot have anyone with them due to current Covid guidelines and our immunocompromised population.   

## 2021-04-23 NOTE — Progress Notes (Signed)
Per Dr. Alen Blew ok to treat with Creat 1.'72mg'$ /dL and ANC 1.3K/uL ?

## 2021-04-24 ENCOUNTER — Other Ambulatory Visit: Payer: Self-pay

## 2021-04-24 ENCOUNTER — Ambulatory Visit
Admission: RE | Admit: 2021-04-24 | Discharge: 2021-04-24 | Disposition: A | Discharge status: Home / Self Care | Payer: 59 | Source: Ambulatory Visit | Attending: Radiation Oncology | Admitting: Radiation Oncology

## 2021-04-24 DIAGNOSIS — Z510 Encounter for antineoplastic radiation therapy: Secondary | ICD-10-CM | POA: Diagnosis not present

## 2021-04-25 ENCOUNTER — Ambulatory Visit
Admission: RE | Admit: 2021-04-25 | Discharge: 2021-04-25 | Disposition: A | Discharge status: Home / Self Care | Payer: 59 | Source: Ambulatory Visit | Attending: Radiation Oncology | Admitting: Radiation Oncology

## 2021-04-25 DIAGNOSIS — Z510 Encounter for antineoplastic radiation therapy: Secondary | ICD-10-CM | POA: Diagnosis not present

## 2021-04-26 ENCOUNTER — Other Ambulatory Visit: Payer: Self-pay

## 2021-04-26 ENCOUNTER — Ambulatory Visit
Admission: RE | Admit: 2021-04-26 | Discharge: 2021-04-26 | Disposition: A | Discharge status: Home / Self Care | Payer: 59 | Source: Ambulatory Visit | Attending: Radiation Oncology | Admitting: Radiation Oncology

## 2021-04-26 DIAGNOSIS — Z510 Encounter for antineoplastic radiation therapy: Secondary | ICD-10-CM | POA: Diagnosis not present

## 2021-04-29 ENCOUNTER — Other Ambulatory Visit: Payer: Self-pay

## 2021-04-29 ENCOUNTER — Ambulatory Visit
Admission: RE | Admit: 2021-04-29 | Discharge: 2021-04-29 | Disposition: A | Discharge status: Home / Self Care | Payer: 59 | Source: Ambulatory Visit | Attending: Radiation Oncology | Admitting: Radiation Oncology

## 2021-04-29 ENCOUNTER — Ambulatory Visit: Payer: 59

## 2021-04-29 DIAGNOSIS — Z510 Encounter for antineoplastic radiation therapy: Secondary | ICD-10-CM | POA: Diagnosis not present

## 2021-04-30 ENCOUNTER — Ambulatory Visit: Payer: 59

## 2021-04-30 ENCOUNTER — Ambulatory Visit
Admission: RE | Admit: 2021-04-30 | Discharge: 2021-04-30 | Disposition: A | Discharge status: Home / Self Care | Payer: 59 | Source: Ambulatory Visit | Attending: Radiation Oncology | Admitting: Radiation Oncology

## 2021-04-30 DIAGNOSIS — Z510 Encounter for antineoplastic radiation therapy: Secondary | ICD-10-CM | POA: Diagnosis not present

## 2021-05-01 ENCOUNTER — Other Ambulatory Visit (HOSPITAL_COMMUNITY): Payer: Self-pay

## 2021-05-01 ENCOUNTER — Other Ambulatory Visit (HOSPITAL_COMMUNITY): Payer: Self-pay | Admitting: Cardiology

## 2021-05-01 ENCOUNTER — Ambulatory Visit
Admission: RE | Admit: 2021-05-01 | Discharge: 2021-05-01 | Disposition: A | Discharge status: Home / Self Care | Payer: 59 | Source: Ambulatory Visit | Attending: Radiation Oncology | Admitting: Radiation Oncology

## 2021-05-01 ENCOUNTER — Ambulatory Visit: Payer: 59

## 2021-05-01 ENCOUNTER — Other Ambulatory Visit: Payer: Self-pay

## 2021-05-01 DIAGNOSIS — Z510 Encounter for antineoplastic radiation therapy: Secondary | ICD-10-CM | POA: Diagnosis not present

## 2021-05-01 MED ORDER — ATORVASTATIN CALCIUM 80 MG PO TABS
ORAL_TABLET | Freq: Every day | ORAL | 6 refills | Status: DC
  Filled 2021-05-01: qty 30, 30d supply, fill #0

## 2021-05-02 ENCOUNTER — Encounter: Payer: Self-pay | Admitting: Urology

## 2021-05-02 ENCOUNTER — Ambulatory Visit
Admission: RE | Admit: 2021-05-02 | Discharge: 2021-05-02 | Disposition: A | Discharge status: Home / Self Care | Payer: 59 | Source: Ambulatory Visit | Attending: Radiation Oncology | Admitting: Radiation Oncology

## 2021-05-02 DIAGNOSIS — Z510 Encounter for antineoplastic radiation therapy: Secondary | ICD-10-CM | POA: Diagnosis not present

## 2021-05-02 DIAGNOSIS — C672 Malignant neoplasm of lateral wall of bladder: Secondary | ICD-10-CM

## 2021-05-08 ENCOUNTER — Other Ambulatory Visit (HOSPITAL_COMMUNITY): Payer: Self-pay

## 2021-05-10 ENCOUNTER — Telehealth: Payer: Self-pay | Admitting: Oncology

## 2021-05-10 NOTE — Telephone Encounter (Signed)
Called patient regarding upcoming appointments, patient is notified. 

## 2021-05-14 ENCOUNTER — Inpatient Hospital Stay: Payer: 59 | Attending: Oncology

## 2021-05-14 ENCOUNTER — Ambulatory Visit: Payer: 59

## 2021-05-14 ENCOUNTER — Other Ambulatory Visit: Payer: Self-pay

## 2021-05-14 ENCOUNTER — Inpatient Hospital Stay (HOSPITAL_BASED_OUTPATIENT_CLINIC_OR_DEPARTMENT_OTHER): Payer: 59 | Admitting: Oncology

## 2021-05-14 VITALS — BP 113/74 | HR 87 | Temp 97.9°F | Resp 19 | Ht 74.0 in | Wt 281.3 lb

## 2021-05-14 DIAGNOSIS — R739 Hyperglycemia, unspecified: Secondary | ICD-10-CM | POA: Insufficient documentation

## 2021-05-14 DIAGNOSIS — C672 Malignant neoplasm of lateral wall of bladder: Secondary | ICD-10-CM | POA: Diagnosis present

## 2021-05-14 DIAGNOSIS — Z79899 Other long term (current) drug therapy: Secondary | ICD-10-CM | POA: Insufficient documentation

## 2021-05-14 DIAGNOSIS — C679 Malignant neoplasm of bladder, unspecified: Secondary | ICD-10-CM

## 2021-05-14 LAB — CMP (CANCER CENTER ONLY)
ALT: 16 U/L (ref 0–44)
AST: 12 U/L — ABNORMAL LOW (ref 15–41)
Albumin: 3.7 g/dL (ref 3.5–5.0)
Alkaline Phosphatase: 69 U/L (ref 38–126)
Anion gap: 9 (ref 5–15)
BUN: 30 mg/dL — ABNORMAL HIGH (ref 8–23)
CO2: 25 mmol/L (ref 22–32)
Calcium: 8.9 mg/dL (ref 8.9–10.3)
Chloride: 103 mmol/L (ref 98–111)
Creatinine: 1.92 mg/dL — ABNORMAL HIGH (ref 0.61–1.24)
GFR, Estimated: 38 mL/min — ABNORMAL LOW (ref >60)
Glucose, Bld: 234 mg/dL — ABNORMAL HIGH (ref 70–99)
Potassium: 4.2 mmol/L (ref 3.5–5.1)
Sodium: 137 mmol/L (ref 135–145)
Total Bilirubin: 0.5 mg/dL (ref 0.3–1.2)
Total Protein: 7.1 g/dL (ref 6.5–8.1)

## 2021-05-14 LAB — CBC WITH DIFFERENTIAL (CANCER CENTER ONLY)
Abs Immature Granulocytes: 0.03 10*3/uL (ref 0.00–0.07)
Basophils Absolute: 0.1 10*3/uL (ref 0.0–0.1)
Basophils Relative: 1 %
Eosinophils Absolute: 1.7 10*3/uL — ABNORMAL HIGH (ref 0.0–0.5)
Eosinophils Relative: 21 %
HCT: 34.3 % — ABNORMAL LOW (ref 39.0–52.0)
Hemoglobin: 11.8 g/dL — ABNORMAL LOW (ref 13.0–17.0)
Immature Granulocytes: 0 %
Lymphocytes Relative: 9 %
Lymphs Abs: 0.8 10*3/uL (ref 0.7–4.0)
MCH: 31.2 pg (ref 26.0–34.0)
MCHC: 34.4 g/dL (ref 30.0–36.0)
MCV: 90.7 fL (ref 80.0–100.0)
Monocytes Absolute: 1.2 10*3/uL — ABNORMAL HIGH (ref 0.1–1.0)
Monocytes Relative: 14 %
Neutro Abs: 4.5 10*3/uL (ref 1.7–7.7)
Neutrophils Relative %: 55 %
Platelet Count: 318 10*3/uL (ref 150–400)
RBC: 3.78 MIL/uL — ABNORMAL LOW (ref 4.22–5.81)
RDW: 16.4 % — ABNORMAL HIGH (ref 11.5–15.5)
WBC Count: 8.2 10*3/uL (ref 4.0–10.5)
nRBC: 0 % (ref 0.0–0.2)

## 2021-05-14 NOTE — Progress Notes (Signed)
Hematology and Oncology Follow Up Visit ? ?KIING DEAKIN ?[id number] ?Oct 06, 1955 66 y.o. ?05/14/2021 7:57 AM ?Richard Miu, Diona Foley, NP  ? ?Principle Diagnosis: 66 year old with bladder cancer diagnosed with T2N0 high-grade urothelial carcinoma in November 2022.  ? ?Prior Therapy: ? ?He is s/p TURBT in November 2022 and repeated in January 2023. ? ?Radiation therapy with weekly cisplatin started on March 11, 2021.   He received carboplatin instead of cisplatin on March 26, 2021 and then switched to gemcitabine on April 02, 2021.  Therapy concluded in March 2023. ? ?Current therapy: Active surveillance ? ? ? ?Interim History: Darius [name] returns today for a follow-up visit.  Since the last visit, he reports no major changes in his health.  He has completed the definitive treatment for his bladder cancer without any major complications.  He denies any nausea, vomiting or abdominal pain.  He denies any hematuria but does report occasional dysuria and burning urination.  His performance status and quality of life remain excellent. ? ? ? ? ?Medications: Updated on review. ?Current Outpatient Medications  ?Medication Sig Dispense Refill  ? apixaban (ELIQUIS) 5 MG TABS tablet TAKE 1 TABLET (5 MG TOTAL) BY MOUTH 2 (TWO) TIMES DAILY. 60 tablet 0  ? atorvastatin (LIPITOR) 80 MG tablet TAKE 1 TABLET BY MOUTH AT BEDTIME. 30 tablet 6  ? ezetimibe (ZETIA) 10 MG tablet Take 1 tablet (10 mg total) by mouth daily. 90 tablet 3  ? metFORMIN (GLUCOPHAGE) 1000 MG tablet Take 1 tablet (1,000 mg total) by mouth 2 (two) times daily with food. 60 tablet 1  ? metoprolol (TOPROL-XL) 200 MG 24 hr tablet Take 1 tablet (200 mg total) by mouth daily. Take with or immediately following a meal. (Patient taking differently: Take 200 mg by mouth daily. Take with or immediately following a meal.) 90 tablet 1  ? oxybutynin (DITROPAN) 5 MG tablet Take 1 tablet (5 mg total) by mouth 2 (two) times daily as needed for bladder spasms. 10  tablet 0  ? oxyCODONE (OXY IR/ROXICODONE) 5 MG immediate release tablet Take 1 tablet  by mouth every 4 hours as needed for moderate pain. 30 tablet 0  ? prochlorperazine (COMPAZINE) 10 MG tablet Take 1 tablet (10 mg total) by mouth every 6 (six) hours as needed for nausea or vomiting. (Patient not taking: Reported on 02/21/2021) 30 tablet 0  ? sacubitril-valsartan (ENTRESTO) 97-103 MG Take 1 tablet by mouth 2 (two) times daily. (Patient taking differently: Take 1 tablet by mouth 2 (two) times daily.) 60 tablet 3  ? spironolactone (ALDACTONE) 25 MG tablet Take 1 tablet (25 mg total) by mouth daily. 40 tablet 0  ? tamsulosin (FLOMAX) 0.4 MG CAPS capsule Take 1 capsule (0.4 mg total) by mouth daily after supper. 30 capsule 3  ? ?No current facility-administered medications for this visit.  ? ? ? ?Allergies: No Known Allergies ? ? ?Physical Exam: ? ? ?Blood pressure 113/74, pulse 87, temperature 97.9 ?F (36.6 ?C), temperature source Temporal, resp. rate 19, height '6\' 2"'$  (1.88 m), weight 281 lb 4.8 oz (127.6 kg), SpO2 97 %. ? ? ?ECOG: 1 ? ? ?General appearance: Comfortable appearing without any discomfort ?Head: Normocephalic without any trauma ?Oropharynx: Mucous membranes are moist and pink without any thrush or ulcers. ?Eyes: Pupils are equal and round reactive to light. ?Lymph nodes: No cervical, supraclavicular, inguinal or axillary lymphadenopathy.   ?Heart:regular rate and rhythm.  S1 and S2 without leg edema. ?Lung: Clear without any rhonchi or wheezes.  No dullness to percussion. ?Abdomin: Soft, nontender, nondistended with good bowel sounds.  No hepatosplenomegaly. ?Musculoskeletal: No joint deformity or effusion.  Full range of motion noted. ?Neurological: No deficits noted on motor, sensory and deep tendon reflex exam. ?Skin: No petechial rash or dryness.  Appeared moist.  ? ? ? ? ? ? ? ?Lab Results: ?Lab Results  ?Component Value Date  ? WBC 2.5 (L) 04/23/2021  ? HGB 12.2 (L) 04/23/2021  ? HCT 34.9 (L)  04/23/2021  ? MCV 87.3 04/23/2021  ? PLT 238 04/23/2021  ? ?  Chemistry   ?   ?Component Value Date/Time  ? NA 134 (L) 04/23/2021 1421  ? NA 139 09/10/2020 1428  ? K 4.2 04/23/2021 1421  ? CL 99 04/23/2021 1421  ? CO2 25 04/23/2021 1421  ? BUN 22 04/23/2021 1421  ? BUN 16 09/10/2020 1428  ? CREATININE 1.72 (H) 04/23/2021 1421  ? CREATININE 1.05 11/12/2016 1051  ?    ?Component Value Date/Time  ? CALCIUM 9.4 04/23/2021 1421  ? ALKPHOS 82 04/23/2021 1421  ? AST 19 04/23/2021 1421  ? ALT 27 04/23/2021 1421  ? BILITOT 0.5 04/23/2021 1421  ?  ? ? ? ? ?Impression and Plan: ? ?66 year old with: ?  ?  ?1.  Bladder cancer diagnosed in November 2022.  He was found to have T2N0 high-grade urothelial carcinoma. ? ?He is a status post definitive therapy with radiation and weekly chemotherapy that concluded in March 2023.  The natural course of his disease was reviewed and risk of relapse was assessed.  At this time he will continue to be on active surveillance and will have repeat cystoscopy under the care of Dr. Junious Silk which is scheduled in the near future.  Additional systemic therapy utilizing immunotherapy could be considered if he has advanced disease in the future. ? ?2.  IV access: No issues reported with peripheral veins at this time.  Chemotherapy has concluded. ?  ?3.  Antiemetics: No residual nausea or vomiting reported at this time. ?  ?4.  Renal function surveillance: creatinine clearance remains around 40 cc/min ? ? ?5.  Goals of care: Aggressive measures remains warranted given his reasonable performance status and incurable disease. ? ?6.  Hyperglycemia: I recommended follow-up regarding this issue with his primary care physician. ?  ?7.  Follow-up: In 6 months for repeat follow-up. ?  ?30  minutes were dedicated to this visit.  The time was spent on reviewing laboratory data, disease status update and outlining future plan of care discussion. ?  ?  ? ?  ? ? ?Darius Button, MD ?4/4/20237:57 AM ? ?

## 2021-05-27 ENCOUNTER — Encounter: Payer: Self-pay | Admitting: Urology

## 2021-05-27 ENCOUNTER — Ambulatory Visit (INDEPENDENT_AMBULATORY_CARE_PROVIDER_SITE_OTHER): Payer: 59 | Admitting: Urology

## 2021-05-27 VITALS — BP 102/68 | HR 128

## 2021-05-27 DIAGNOSIS — R310 Gross hematuria: Secondary | ICD-10-CM

## 2021-05-27 DIAGNOSIS — D414 Neoplasm of uncertain behavior of bladder: Secondary | ICD-10-CM | POA: Diagnosis not present

## 2021-05-27 DIAGNOSIS — C672 Malignant neoplasm of lateral wall of bladder: Secondary | ICD-10-CM | POA: Diagnosis not present

## 2021-05-27 MED ORDER — CIPROFLOXACIN HCL 500 MG PO TABS
500.0000 mg | ORAL_TABLET | Freq: Once | ORAL | Status: AC
  Administered 2021-05-27: 500 mg via ORAL

## 2021-05-27 NOTE — Progress Notes (Signed)
? ?  05/27/21 ? ?CC: bladder cancer  ? ?HPI: ?F/u -  ? ? 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  Had a 2.5 cm right wall tumor completely resected which was T2 and then large patches on the bladder neck, right bladder wall, superior, left bladder wall with residual disease.  From Jan - Mar 2023 he underwent max TUR+radiation+chemo ? ?Primary treatment: ?Jan - Mar 2023 - max TUR+radiation+chemo ? ?Biopsy/TUR: ?November 2022-TURBT - HG T2 right, right BN HG CIS, right wall HG T1, superior HG T1, left bladder wall HG CIS ?Jan 2023 - HGT1, CIS  ?  ?Staging: ?Aug 2022 CT abd and pelvis 08/22 - a 2.5 cm right posterior bladder mass. No LAD or bone lesions.  ? ?Surveillance ([facility]): ? CTU or MRU (image upper tracts + axial imaging of abdomen/pelvis) every 3-6 mo x 2 years and consider annually  ? CT chest (preferred) or chest x-ray every 3-6 mo x 2 years and then annually  ? ? ?AUASS = 14. Urgency and nocturia. Of note, he has a sleep study pending from pulmonology.  He has obesity. His Cr was 1.01 Nov 2020.  He did say now that he has healed up from the TUR BT some of the frequency urgency has improved. ?  ?He has a history of A. fib and an atrial thrombus and is on Eliquis. 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  ?  ?Today, seen for the above.  Darius [name] returns for cystoscopy and coordination of care related to his bladder cancer. I reviewed Dr. Hazeline Junker notes.  He has had no dysuria or gross hematuria.  He has had continued frequency and some diarrhea.  This is improving. ? ?There were no vitals taken for this visit. ?NED. A&Ox3.   ?No respiratory distress   ?Abd soft, NT, ND ?Normal phallus with bilateral descended testicles ? ?Cystoscopy Procedure Note ? ?Patient identification was confirmed, informed consent was obtained, and patient was prepped using Betadine solution.   Lidocaine jelly was administered per urethral meatus.   ? ? ?Pre-Procedure: ?- Inspection reveals a normal caliber ureteral meatus. ? ?Procedure: ?The flexible cystoscope was introduced without difficulty ?- No urethral strictures/lesions are present. ?- prostate borderline obs  ?- bladder neck biopsy sites continue to heal with granulation tissue ?- Bilateral ureteral orifices identified ?- Bladder mucosa  reveals no ulcers, tumors, or lesions -posterior left bladder wall biopsy sites continue to heal and are covered with tan granulation tissue ?- No bladder stones ?- No trabeculation ? ?Retroflexion shows bladder neck with granulation tissue ? ? ?Post-Procedure: ?- Patient tolerated the procedure well ? ?Assessment/ Plan: ? ?Bladder cancer-cystoscopy today without papillary bladder lesion.  He continues to recover from his trimodality therapy.  He will be due for upper tract imaging and surveillance with CT abdomen and pelvis August 2023, so we will go ahead and set that up (CT C/A/P without) in July 2023 with follow-up cystoscopy. ? ?No follow-ups on file. ? ?Festus Aloe, MD ? ?

## 2021-05-28 LAB — URINALYSIS, ROUTINE W REFLEX MICROSCOPIC
Bilirubin, UA: NEGATIVE
Nitrite, UA: NEGATIVE
Specific Gravity, UA: >1.03 — ABNORMAL HIGH (ref 1.005–1.030)
Urobilinogen, Ur: 1 mg/dL (ref 0.2–1.0)
pH, UA: 5.5 (ref 5.0–7.5)

## 2021-05-28 LAB — MICROSCOPIC EXAMINATION
Renal Epithel, UA: NONE SEEN /hpf
WBC, UA: >30 /hpf — AB (ref 0–5)

## 2021-05-30 ENCOUNTER — Other Ambulatory Visit (HOSPITAL_COMMUNITY): Payer: Self-pay

## 2021-05-31 ENCOUNTER — Other Ambulatory Visit (HOSPITAL_COMMUNITY): Payer: Self-pay

## 2021-06-04 ENCOUNTER — Telehealth: Payer: Self-pay

## 2021-06-04 ENCOUNTER — Encounter: Payer: Self-pay | Admitting: Urology

## 2021-06-04 NOTE — Progress Notes (Signed)
Telephone appointment. I verified patient identity and began nursing interview. Patient reports dysuria 4/10. No other issues reported at this time. ? ?Meaningful use complete. ?I-PSS score of 4 (mild). ?Currently on Flomax 0.'4mg'$  as directed. ?Urology appointment-  ? ?Reminded patient of his 9:30am-06/05/21 telephone appointment w/ Ashlyn Bruning PA-C. I left my extension [phone] in case patient needs anything. ? ?Patient contact- [phone] ?

## 2021-06-04 NOTE — Telephone Encounter (Signed)
Left a voicemail reminder of patient's 9:30am-06/05/21 telephone appointment w/ Darius Bruning PA-C. I left my extension [phone] and requested that patient return my call prior to appointment time, so that I may  complete the nursing portion of this appointment. ?

## 2021-06-05 ENCOUNTER — Ambulatory Visit
Admission: RE | Admit: 2021-06-05 | Discharge: 2021-06-05 | Disposition: A | Discharge status: Home / Self Care | Payer: 59 | Source: Ambulatory Visit | Attending: Urology | Admitting: Urology

## 2021-06-05 DIAGNOSIS — C672 Malignant neoplasm of lateral wall of bladder: Secondary | ICD-10-CM

## 2021-06-05 NOTE — Progress Notes (Signed)
?  Radiation Oncology         (336) [phone] ?________________________________ ? ?Name: Darius [name] MRN: [id number]  ?Date: 05/02/2021  DOB: [birth year] ? ?End of Treatment Note ? ?Diagnosis:    66 year old male with high-grade, muscle invasive bladder cancer.    ? ?Indication for treatment:  Curative, Definitive Radiotherapy      ? ?Radiation treatment dates:   03/14/21 - 05/02/21 (concurrent with chemotherapy) ? ?Site/dose:  ?1. The bladder tumor only was initially boosted to 19.8 Gy with 11 additional fractions of 1.8 Gy with a full bladder set-up  ?2. The bladder and pelvic lymph nodes were subsequently treated to 45 Gy in 25 fractions of 1.8 Gy with empty bladder carrying the total tumor dose to 64.8 Gy ? ?Beams/energy:  ?1. The bladder tumor only was boosted using 3D 15 megavolt photons. Image guidance was performed with CB-CT studies prior to each fraction. The patient was immobilized with a body fix lower extremity mold. ?2. The bladder and pelvic lymph nodes were subsequently treated using 3D radiotherapy delivering 15 megavolt photons. Image guidance was performed with CB-CT studies prior to each fraction. The patient was immobilized with a body fix lower extremity mold.  ? ?Narrative: The patient tolerated radiation treatment relatively well with only minor urinary irritation and modest fatigue.  He did report increased frequency, urgency and dysuria but denied any abdominal pain, nausea, vomiting, diarrhea or constipation. ? ?Plan: The patient has completed radiation treatment. He will return to radiation oncology clinic for routine followup in one month. I advised him to call or return sooner if he has any questions or concerns related to his recovery or treatment. ?________________________________ ? ?Sheral Apley Tammi Klippel, M.D.  ?

## 2021-06-20 NOTE — Progress Notes (Signed)
?Radiation Oncology         (336) [phone] ?________________________________ ? ?Name: GREOGRY GOODWYN MRN: [id number]  ?Date: 06/05/2021  DOB: [birth year] ? ?Post Treatment Note ? ?CC: Vevelyn Francois, NP  Wyatt Portela, MD ? ?Diagnosis:    66 year old male with newly diagnosed high-grade, muscle invasive bladder cancer. ? ?Interval Since Last Radiation:  5.5 weeks  ?03/14/21 - 05/02/21 (concurrent with chemotherapy): ?1. The bladder tumor only was initially boosted to 19.8 Gy with 11 additional fractions of 1.8 Gy with a full bladder set-up  ?2. The bladder and pelvic lymph nodes were subsequently treated to 45 Gy in 25 fractions of 1.8 Gy with empty bladder carrying the total tumor dose to 64.8 Gy ? ? ?Narrative: I spoke with the patient to conduct his routine scheduled 1 month follow up visit via telephone to spare the patient unnecessary potential exposure in the healthcare setting during the current COVID-19 pandemic.  The patient was notified in advance and gave permission to proceed with this visit format. ? ?He tolerated radiation treatment relatively well with only minor urinary irritation and modest fatigue.  He did report increased frequency, urgency and dysuria but denied any abdominal pain, nausea, vomiting, diarrhea or constipation.                             ? ?On review of systems, the patient states that he is doing well in general.  He continues with some mild dysuria but feels like overall, his LUTS are gradually improving.  He is taking Flomax daily as prescribed.  He specifically denies gross hematuria, straining to void, incomplete bladder emptying or incontinence.  He reports a healthy appetite and is maintaining his weight.  He denies abdominal pain, nausea, vomiting, diarrhea or constipation.  Overall, he is pleased with his progress to date. ? ?ALLERGIES:  has No Known Allergies. ? ?Meds: ?Current Outpatient Medications  ?Medication Sig Dispense Refill  ? apixaban (ELIQUIS) 5 MG TABS tablet  TAKE 1 TABLET (5 MG TOTAL) BY MOUTH 2 (TWO) TIMES DAILY. 60 tablet 0  ? atorvastatin (LIPITOR) 80 MG tablet TAKE 1 TABLET BY MOUTH AT BEDTIME. 30 tablet 6  ? ezetimibe (ZETIA) 10 MG tablet Take 1 tablet (10 mg total) by mouth daily. 90 tablet 3  ? metFORMIN (GLUCOPHAGE) 1000 MG tablet Take 1 tablet (1,000 mg total) by mouth 2 (two) times daily with food. 60 tablet 1  ? metoprolol (TOPROL-XL) 200 MG 24 hr tablet Take 1 tablet (200 mg total) by mouth daily. Take with or immediately following a meal. (Patient taking differently: Take 200 mg by mouth daily. Take with or immediately following a meal.) 90 tablet 1  ? oxybutynin (DITROPAN) 5 MG tablet Take 1 tablet (5 mg total) by mouth 2 (two) times daily as needed for bladder spasms. 10 tablet 0  ? oxyCODONE (OXY IR/ROXICODONE) 5 MG immediate release tablet Take 1 tablet  by mouth every 4 hours as needed for moderate pain. 30 tablet 0  ? prochlorperazine (COMPAZINE) 10 MG tablet Take 1 tablet (10 mg total) by mouth every 6 (six) hours as needed for nausea or vomiting. 30 tablet 0  ? sacubitril-valsartan (ENTRESTO) 97-103 MG Take 1 tablet by mouth 2 (two) times daily. (Patient taking differently: Take 1 tablet by mouth 2 (two) times daily.) 60 tablet 3  ? spironolactone (ALDACTONE) 25 MG tablet Take 1 tablet (25 mg total) by mouth daily. 40 tablet 0  ?  tamsulosin (FLOMAX) 0.4 MG CAPS capsule Take 1 capsule (0.4 mg total) by mouth daily after supper. 30 capsule 3  ? ?No current facility-administered medications for this encounter.  ? ? ?Physical Findings: ? vitals were not taken for this visit.  ?Pain Assessment ?Pain Score: 4  (dysuria)/10 ?Unable to assess due to telephone follow-up visit format. ? ?Lab Findings: ?Lab Results  ?Component Value Date  ? WBC 8.2 05/14/2021  ? HGB 11.8 (L) 05/14/2021  ? HCT 34.3 (L) 05/14/2021  ? MCV 90.7 05/14/2021  ? PLT 318 05/14/2021  ? ? ? ?Radiographic Findings: ?No results found. ? ?Impression/Plan: ?55.  66 year old male with newly  diagnosed high-grade, muscle invasive bladder cancer. ?He appears to have recovered well from the effects of his recent bladder radiation.  He is scheduled for repeat CT imaging on 08/22/2021 and an office visit for cystoscopy with Dr. Junious Silk on 08/26/2021.  We discussed that while we are happy to continue to participate in his care if clinically indicated, at this time, we will plan to see him back on an as-needed basis.  He will continue in routine follow-up under the care and direction of Dr. Junious Silk and Dr. Alen Blew for continued management of his systemic disease.  He has a scheduled follow-up visit with Dr. Alen Blew on 11/14/2021.  We enjoyed taking care of him and look forward to continuing to follow his progress via correspondence. ? ? ? ?Nicholos Johns, PA-C  ?

## 2021-07-03 ENCOUNTER — Other Ambulatory Visit (HOSPITAL_COMMUNITY): Payer: Self-pay

## 2021-07-17 ENCOUNTER — Other Ambulatory Visit: Payer: Self-pay | Admitting: Nurse Practitioner

## 2021-08-22 ENCOUNTER — Ambulatory Visit (HOSPITAL_COMMUNITY)
Admission: RE | Admit: 2021-08-22 | Discharge: 2021-08-22 | Disposition: A | Discharge status: Home / Self Care | Payer: Medicare Other | Source: Ambulatory Visit | Attending: Urology | Admitting: Urology

## 2021-08-22 DIAGNOSIS — C672 Malignant neoplasm of lateral wall of bladder: Secondary | ICD-10-CM | POA: Diagnosis present

## 2021-08-26 ENCOUNTER — Ambulatory Visit (INDEPENDENT_AMBULATORY_CARE_PROVIDER_SITE_OTHER): Payer: Medicare Other | Admitting: Urology

## 2021-08-26 VITALS — BP 135/80 | HR 85 | Ht 74.0 in | Wt 283.8 lb

## 2021-08-26 DIAGNOSIS — Z8551 Personal history of malignant neoplasm of bladder: Secondary | ICD-10-CM | POA: Diagnosis not present

## 2021-08-26 LAB — URINALYSIS, ROUTINE W REFLEX MICROSCOPIC
Bilirubin, UA: NEGATIVE
Ketones, UA: NEGATIVE
Nitrite, UA: NEGATIVE
Specific Gravity, UA: >1.03 — ABNORMAL HIGH (ref 1.005–1.030)
Urobilinogen, Ur: 1 mg/dL (ref 0.2–1.0)
pH, UA: 5 (ref 5.0–7.5)

## 2021-08-26 LAB — MICROSCOPIC EXAMINATION: Renal Epithel, UA: NONE SEEN /hpf

## 2021-08-26 MED ORDER — CIPROFLOXACIN HCL 500 MG PO TABS
500.0000 mg | ORAL_TABLET | Freq: Once | ORAL | Status: AC
  Administered 2021-08-26: 500 mg via ORAL

## 2021-08-26 NOTE — Progress Notes (Signed)
   08/26/21  CC: No chief complaint on file.   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 In Jan 2023, repeat/max TUR with large patches on the bladder neck, right bladder wall, superior, left bladder wall with residual disease.  From Jan - Mar 2023 he underwent radiation+chemo to preserve bladder.    Primary treatment: Jan - Mar 2023 - max TUR+radiation+chemo   Biopsy/TUR: November 2022-TURBT - HG T2 right, right BN HG CIS, right wall HG T1, superior HG T1, left bladder wall HG CIS Jan 2023 - HGT1, CIS    Staging: Aug 2022 CT abd and pelvis 08/22 - a 2.5 cm right posterior bladder mass. No LAD or bone lesions.  Jul 2023 CT C/A/P - no evidence of disease    Surveillance ([facility]):  CTU or MRU (image upper tracts + axial imaging of abdomen/pelvis) every 3-6 mo x 2 years and consider annually   CT chest (preferred) or chest x-ray every 3-6 mo x 2 years and then annually      AUASS = 14. Urgency and nocturia. Of note, he has a sleep study pending from pulmonology.  He has obesity. His Cr was 1.01 Nov 2020.     He has a history of A. fib and an atrial thrombus and is on Eliquis. 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   He returns today in management of bladder cancer and for cystoscopy. His CT was benign Jul 2023 - images reviewed. He is well with no dysuria or gross hematuria.    Blood pressure 135/80, pulse 85, height '6\' 2"'$  (1.88 m), weight 283 lb 12.8 oz (128.7 kg). NED. A&Ox3.   No respiratory distress   Abd soft, NT, ND Normal phallus with bilateral descended testicles  Cystoscopy Procedure Note  Patient identification was confirmed, informed consent was obtained, and patient was prepped using Betadine solution.  Lidocaine jelly was administered per urethral meatus.     Pre-Procedure: - Inspection  reveals a normal caliber ureteral meatus.  Procedure: The flexible cystoscope was introduced without difficulty - No urethral strictures/lesions are present. - borderline obstructing prostate  - a small area of yellow granulation tissue on the right bladder neck resection site. This was scraped off and no residual tumor noted - Bilateral ureteral orifices identified - Bladder mucosa  reveals no ulcers, tumors, or lesions. A small are of yellow granulation tissue on the left bladder (can see this on the CT) scraped off and no residual tumor  - No bladder stones - No trabeculation  Retroflexion shows normal bladder and bladder neck   Post-Procedure: - Patient tolerated the procedure well  Assessment/ Plan:  H/o bladder ca - NED - need to monitor bladder closely with cystoscopy in 3 months and consider CT in Jan 2024.   No follow-ups on file.  Festus Aloe, MD

## 2021-11-14 ENCOUNTER — Inpatient Hospital Stay: Payer: Medicare Other | Attending: Oncology | Admitting: Oncology

## 2021-11-14 ENCOUNTER — Other Ambulatory Visit: Payer: Self-pay

## 2021-11-14 ENCOUNTER — Inpatient Hospital Stay: Payer: Medicare Other

## 2021-11-14 VITALS — BP 158/74 | HR 84 | Temp 98.1°F | Resp 18 | Ht 74.0 in | Wt 297.8 lb

## 2021-11-14 DIAGNOSIS — Z8551 Personal history of malignant neoplasm of bladder: Secondary | ICD-10-CM | POA: Insufficient documentation

## 2021-11-14 DIAGNOSIS — C679 Malignant neoplasm of bladder, unspecified: Secondary | ICD-10-CM

## 2021-11-14 NOTE — Progress Notes (Signed)
Hematology and Oncology Follow Up Visit  Darius [name] [id number] October 05, 1955 66 y.o. 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   Prior Therapy:  He is s/p TURBT in November 2022 and repeated in January 2023.  Radiation therapy with weekly cisplatin started on March 11, 2021.   He received carboplatin instead of cisplatin on March 26, 2021 and then switched to gemcitabine on April 02, 2021.  Therapy concluded in March 2023.  Current therapy: Active surveillance    Interim History: Darius [name] presents today for a repeat evaluation.  Since last visit, he continues to have surveillance cystoscopy under the care of Darius [name].  His most recent evaluation in July 2023 showed no evidence of recurrent disease.  He has no residual complications related to chemotherapy.     Medications: Reviewed without changes. Current Outpatient Medications  Medication Sig Dispense Refill   apixaban (ELIQUIS) 5 MG TABS tablet TAKE 1 TABLET (5 MG TOTAL) BY MOUTH 2 (TWO) TIMES DAILY. 60 tablet 0   atorvastatin (LIPITOR) 80 MG tablet TAKE 1 TABLET BY MOUTH AT BEDTIME. 30 tablet 6   ezetimibe (ZETIA) 10 MG tablet Take 1 tablet (10 mg total) by mouth daily. 90 tablet 3   metFORMIN (GLUCOPHAGE) 1000 MG tablet Take 1 tablet (1,000 mg total) by mouth 2 (two) times daily with food. 60 tablet 1   metoprolol (TOPROL-XL) 200 MG 24 hr tablet Take 1 tablet (200 mg total) by mouth daily. Take with or immediately following a meal. (Patient taking differently: Take 200 mg by mouth daily. Take with or immediately following a meal.) 90 tablet 1   oxybutynin (DITROPAN) 5 MG tablet Take 1 tablet (5 mg total) by mouth 2 (two) times daily as needed for bladder spasms. 10 tablet 0   oxyCODONE (OXY IR/ROXICODONE) 5 MG immediate release tablet Take 1 tablet  by mouth every 4 hours as  needed for moderate pain. 30 tablet 0   prochlorperazine (COMPAZINE) 10 MG tablet Take 1 tablet (10 mg total) by mouth every 6 (six) hours as needed for nausea or vomiting. 30 tablet 0   sacubitril-valsartan (ENTRESTO) 97-103 MG Take 1 tablet by mouth 2 (two) times daily. (Patient taking differently: Take 1 tablet by mouth 2 (two) times daily.) 60 tablet 3   spironolactone (ALDACTONE) 25 MG tablet Take 1 tablet (25 mg total) by mouth daily. 40 tablet 0   tamsulosin (FLOMAX) 0.4 MG CAPS capsule Take 1 capsule (0.4 mg total) by mouth daily after supper. 30 capsule 3   No current facility-administered medications for this visit.     Allergies: No Known Allergies   Physical Exam:   Blood pressure (!) 158/74, pulse 84, temperature 98.1 F (36.7 C), temperature source Temporal, resp. rate 18, height '6\' 2"'$  (1.88 m), weight 297 lb 12.8 oz (135.1 kg), SpO2 97 %.    ECOG: 1    General appearance: Comfortable appearing without any discomfort Head: Normocephalic without any trauma Oropharynx: Mucous membranes are moist and pink without any thrush or ulcers. Eyes: Pupils are equal and round reactive to light. Lymph nodes: No cervical, supraclavicular, inguinal or axillary lymphadenopathy.   Heart:regular rate and rhythm.  S1 and S2 without leg edema. Lung: Clear without any rhonchi or wheezes.  No dullness to percussion. Abdomin: Soft, nontender, nondistended with good bowel sounds.  No hepatosplenomegaly. Musculoskeletal: No joint deformity or effusion.  Full  range of motion noted. Neurological: No deficits noted on motor, sensory and deep tendon reflex exam. Skin: No petechial rash or dryness.  Appeared moist.           Lab Results: Lab Results  Component Value Date   WBC 8.2 05/14/2021   HGB 11.8 (L) 05/14/2021   HCT 34.3 (L) 05/14/2021   MCV 90.7 05/14/2021   PLT 318 05/14/2021     Chemistry      Component Value Date/Time   NA 137 05/14/2021 0807   NA 139 09/10/2020  1428   K 4.2 05/14/2021 0807   CL 103 05/14/2021 0807   CO2 25 05/14/2021 0807   BUN 30 (H) 05/14/2021 0807   BUN 16 09/10/2020 1428   CREATININE 1.92 (H) 05/14/2021 0807   CREATININE 1.05 11/12/2016 1051      Component Value Date/Time   CALCIUM 8.9 05/14/2021 0807   ALKPHOS 69 05/14/2021 0807   AST 12 (L) 05/14/2021 0807   ALT 16 05/14/2021 0807   BILITOT 0.5 05/14/2021 0807        Impression and Plan:  66 year old man with:     1.  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  He continues to be on active surveillance without any evidence of relapsed disease.  His most recent cystoscopy in July under the care of Darius [name] showed complete response.  Different salvage therapy options including systemic chemotherapy, immunotherapy and antibody drug conjugate would be instituted in the future if he has any relapse disease.  He will continue to follow with Darius [name] regarding his surveillance.      2.  Follow-up:  will be as needed in the future if he develop disease recurrence.   30  minutes were spent on this encounter.  The time was dedicated to reviewing laboratory data, disease status update and outlining future plan of care discussion.          Darius Button, MD 10/5/20232:33 PM

## 2021-11-25 ENCOUNTER — Ambulatory Visit (INDEPENDENT_AMBULATORY_CARE_PROVIDER_SITE_OTHER): Payer: Medicare Other | Admitting: Urology

## 2021-11-25 VITALS — BP 145/81 | HR 63

## 2021-11-25 DIAGNOSIS — N1832 Chronic kidney disease, stage 3b: Secondary | ICD-10-CM

## 2021-11-25 DIAGNOSIS — Z8551 Personal history of malignant neoplasm of bladder: Secondary | ICD-10-CM

## 2021-11-25 LAB — URINALYSIS, ROUTINE W REFLEX MICROSCOPIC
Bilirubin, UA: NEGATIVE
Nitrite, UA: NEGATIVE
RBC, UA: NEGATIVE
Specific Gravity, UA: 1.025 (ref 1.005–1.030)
Urobilinogen, Ur: 0.2 mg/dL (ref 0.2–1.0)
pH, UA: 5 (ref 5.0–7.5)

## 2021-11-25 LAB — MICROSCOPIC EXAMINATION: RBC, Urine: NONE SEEN /hpf (ref 0–2)

## 2021-11-25 MED ORDER — CIPROFLOXACIN HCL 500 MG PO TABS
500.0000 mg | ORAL_TABLET | Freq: Once | ORAL | Status: AC
  Administered 2021-11-25: 500 mg via ORAL

## 2021-11-25 NOTE — Progress Notes (Unsigned)
   11/25/21  CC: No chief complaint on file.   HPI:  F/u -      1) bladder cancer-patient diagnosed Nov 2022 with high-grade T2 bladder cancer right bladder, right bladder neck and left high-grade CIS, right bladder wall and superior high-grade T1 disease. In Jan 2023, repeat/max TUR with large patches on the bladder neck, right bladder wall, superior, left bladder wall with residual disease.  From Jan - Mar 2023 he underwent radiation+chemo to preserve bladder.    Primary treatment: Jan - Mar 2023 - max TUR+radiation+chemo   Biopsy/TUR: November 2022-TURBT - HG T2 right, right BN HG CIS, right wall HG T1, superior HG T1, left bladder wall HG CIS Jan 2023 - HGT1, CIS    Staging: Aug 2022 CT abd and pelvis 08/22 - a 2.5 cm right posterior bladder mass. No LAD or bone lesions.  Jul 2023 CT C/A/P - no evidence of disease    Surveillance (NCCN):  CTU or MRU (image upper tracts + axial imaging of abdomen/pelvis) every 3-6 mo x 2 years and consider annually   CT chest (preferred) or chest x-ray every 3-6 mo x 2 years and then annually      AUASS = 14. Urgency and nocturia. He had a sleep study pending from pulmonology.  He has obesity. His Cr was 1.01 Nov 2020 and 1.92 in Apr 2023.   He has a history of A. fib and an atrial thrombus and is on Eliquis. 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    He returns today in management of bladder cancer and for cystoscopy. He is well with no dysuria or gross hematuria. His blood sugar has been high and his Cr was as high as 2.06 Apr 2021 and 1.92 in Apr 2023.   There were no vitals taken for this visit. NED. A&Ox3.   No respiratory distress   Abd soft, NT, ND Normal phallus with bilateral descended testicles  Cystoscopy Procedure Note  Patient identification was confirmed, informed consent was obtained, and patient was prepped using Betadine solution.  Lidocaine jelly was administered per urethral meatus.     Pre-Procedure: -  Inspection reveals a normal caliber ureteral meatus.  Procedure: The flexible cystoscope was introduced without difficulty - No urethral strictures/lesions are present. -  normal prostate  - normal bladder neck - Bilateral ureteral orifices identified - Bladder mucosa  reveals no ulcers, tumors, or lesions - still some granulation tissue left bladder. Wiped off with scope and no recurrence.  - No bladder stones - No trabeculation  Retroflexion shows normal bladder and BN apart from above.    Post-Procedure: - Patient tolerated the procedure well  Assessment/ Plan:  1) h/o HGT1/T2 bladder ca - NED on cysto. Needs CT C/A/P and cystoscopy in 3 mo.   2) CKD - discussed importance of adhering to his DM and blood pressure meds or he risks continued renal failure and dialysis. Also recommended he f/u with PCP. I also have him the # for the [facility] to call for an appt as he was a patient at the sickle cell clinic.    Festus Aloe, MD

## 2021-11-28 ENCOUNTER — Telehealth: Payer: Self-pay

## 2021-11-28 NOTE — Telephone Encounter (Signed)
Scheduled patient for CT 10/25. Per CT Department patient needs a CMP  prior. They advised that it could be done day of at [facility] once order is placed. I will call and let patient know.     Thank you

## 2021-12-04 ENCOUNTER — Ambulatory Visit (HOSPITAL_COMMUNITY)
Admission: RE | Admit: 2021-12-04 | Discharge: 2021-12-04 | Disposition: A | Discharge status: Home / Self Care | Payer: Medicare Other | Source: Ambulatory Visit | Attending: Urology | Admitting: Urology

## 2021-12-04 DIAGNOSIS — I700 Atherosclerosis of aorta: Secondary | ICD-10-CM | POA: Insufficient documentation

## 2021-12-04 DIAGNOSIS — I2510 Atherosclerotic heart disease of native coronary artery without angina pectoris: Secondary | ICD-10-CM | POA: Insufficient documentation

## 2021-12-04 DIAGNOSIS — Z8551 Personal history of malignant neoplasm of bladder: Secondary | ICD-10-CM | POA: Insufficient documentation

## 2021-12-19 ENCOUNTER — Other Ambulatory Visit (HOSPITAL_COMMUNITY): Payer: Self-pay

## 2021-12-19 ENCOUNTER — Encounter (HOSPITAL_COMMUNITY): Payer: Self-pay | Admitting: Cardiology

## 2021-12-19 ENCOUNTER — Ambulatory Visit (HOSPITAL_COMMUNITY)
Admission: RE | Admit: 2021-12-19 | Discharge: 2021-12-19 | Disposition: A | Discharge status: Home / Self Care | Payer: Medicare Other | Source: Ambulatory Visit | Attending: Cardiology | Admitting: Cardiology

## 2021-12-19 VITALS — BP 92/60 | HR 84 | Wt 296.4 lb

## 2021-12-19 DIAGNOSIS — I2510 Atherosclerotic heart disease of native coronary artery without angina pectoris: Secondary | ICD-10-CM | POA: Insufficient documentation

## 2021-12-19 DIAGNOSIS — I428 Other cardiomyopathies: Secondary | ICD-10-CM | POA: Diagnosis not present

## 2021-12-19 DIAGNOSIS — E785 Hyperlipidemia, unspecified: Secondary | ICD-10-CM | POA: Diagnosis not present

## 2021-12-19 DIAGNOSIS — Z8551 Personal history of malignant neoplasm of bladder: Secondary | ICD-10-CM | POA: Insufficient documentation

## 2021-12-19 DIAGNOSIS — Z79899 Other long term (current) drug therapy: Secondary | ICD-10-CM | POA: Diagnosis not present

## 2021-12-19 DIAGNOSIS — R0602 Shortness of breath: Secondary | ICD-10-CM | POA: Diagnosis not present

## 2021-12-19 DIAGNOSIS — Z7984 Long term (current) use of oral hypoglycemic drugs: Secondary | ICD-10-CM | POA: Insufficient documentation

## 2021-12-19 DIAGNOSIS — I5082 Biventricular heart failure: Secondary | ICD-10-CM | POA: Diagnosis not present

## 2021-12-19 DIAGNOSIS — I5042 Chronic combined systolic (congestive) and diastolic (congestive) heart failure: Secondary | ICD-10-CM | POA: Diagnosis not present

## 2021-12-19 DIAGNOSIS — I5032 Chronic diastolic (congestive) heart failure: Secondary | ICD-10-CM

## 2021-12-19 DIAGNOSIS — E1140 Type 2 diabetes mellitus with diabetic neuropathy, unspecified: Secondary | ICD-10-CM | POA: Insufficient documentation

## 2021-12-19 DIAGNOSIS — G4733 Obstructive sleep apnea (adult) (pediatric): Secondary | ICD-10-CM | POA: Insufficient documentation

## 2021-12-19 DIAGNOSIS — Z7901 Long term (current) use of anticoagulants: Secondary | ICD-10-CM | POA: Diagnosis not present

## 2021-12-19 DIAGNOSIS — Z86718 Personal history of other venous thrombosis and embolism: Secondary | ICD-10-CM | POA: Diagnosis not present

## 2021-12-19 DIAGNOSIS — I482 Chronic atrial fibrillation, unspecified: Secondary | ICD-10-CM | POA: Diagnosis not present

## 2021-12-19 LAB — CBC
HCT: 47.8 % (ref 39.0–52.0)
Hemoglobin: 16.5 g/dL (ref 13.0–17.0)
MCH: 30.4 pg (ref 26.0–34.0)
MCHC: 34.5 g/dL (ref 30.0–36.0)
MCV: 88.2 fL (ref 80.0–100.0)
Platelets: 262 10*3/uL (ref 150–400)
RBC: 5.42 MIL/uL (ref 4.22–5.81)
RDW: 13.4 % (ref 11.5–15.5)
WBC: 8.7 10*3/uL (ref 4.0–10.5)
nRBC: 0 % (ref 0.0–0.2)

## 2021-12-19 LAB — LIPID PANEL
Cholesterol: 186 mg/dL (ref 0–200)
HDL: 46 mg/dL (ref >40)
LDL Cholesterol: 112 mg/dL — ABNORMAL HIGH (ref 0–99)
Total CHOL/HDL Ratio: 4 RATIO
Triglycerides: 141 mg/dL (ref <150)
VLDL: 28 mg/dL (ref 0–40)

## 2021-12-19 LAB — BASIC METABOLIC PANEL
Anion gap: 12 (ref 5–15)
BUN: 27 mg/dL — ABNORMAL HIGH (ref 8–23)
CO2: 24 mmol/L (ref 22–32)
Calcium: 9.3 mg/dL (ref 8.9–10.3)
Chloride: 101 mmol/L (ref 98–111)
Creatinine, Ser: 2.23 mg/dL — ABNORMAL HIGH (ref 0.61–1.24)
GFR, Estimated: 32 mL/min — ABNORMAL LOW (ref >60)
Glucose, Bld: 159 mg/dL — ABNORMAL HIGH (ref 70–99)
Potassium: 4.4 mmol/L (ref 3.5–5.1)
Sodium: 137 mmol/L (ref 135–145)

## 2021-12-19 MED ORDER — EMPAGLIFLOZIN 10 MG PO TABS
10.0000 mg | ORAL_TABLET | Freq: Every day | ORAL | 3 refills | Status: DC
  Filled 2021-12-19: qty 90, 90d supply, fill #0
  Filled 2022-04-22: qty 90, 90d supply, fill #1
  Filled 2022-08-25: qty 90, 90d supply, fill #2

## 2021-12-19 NOTE — Patient Instructions (Addendum)
Start Jardiance '10mg'$  daily.  Labs done today, your results will be available in MyChart, we will contact you for abnormal readings.  Repeat blood work in 10 days.  Your physician has requested that you have an echocardiogram. Echocardiography is a painless test that uses sound waves to create images of your heart. It provides your doctor with information about the size and shape of your heart and how well your heart's chambers and valves are working. This procedure takes approximately one hour. There are no restrictions for this procedure. Please do NOT wear cologne, perfume, aftershave, or lotions (deodorant is allowed). Please arrive 15 minutes prior to your appointment time.  Dr. Theodosia Blender office should call you to arrange an appointment for sleep medicine.  Your physician recommends that you schedule a follow-up appointment in: 4 months (March 2024)  ** please call the office in January to arrange your follow up appointment **  If you have any questions or concerns before your next appointment please send [place] a message through [place] or call our office at [phone].    TO LEAVE A MESSAGE FOR THE NURSE SELECT OPTION 2, PLEASE LEAVE A MESSAGE INCLUDING: YOUR NAME DATE OF BIRTH CALL BACK NUMBER REASON FOR CALL**this is important as we prioritize the call backs  YOU WILL RECEIVE A CALL BACK THE SAME DAY AS LONG AS YOU CALL BEFORE 4:00 PM  At the [facility], you and your health needs are our priority. As part of our continuing mission to provide you with exceptional heart care, we have created designated Provider Care Teams. These Care Teams include your primary Cardiologist (physician) and Advanced Practice Providers (APPs- Physician Assistants and Nurse Practitioners) who all work together to provide you with the care you need, when you need it.   You may see any of the following providers on your designated Care Team at your next follow up: Dr Glori Bickers Dr  Loralie Champagne Dr. Roxana Hires, NP Lyda Jester, [place] [facility] [place], [place] Forestine Na, NP Audry Riles, PharmD   Please be sure to bring in all your medications bottles to every appointment.

## 2021-12-19 NOTE — Progress Notes (Signed)
Advanced Heart Failure Clinic Note   PCP: Darius Benton, DO  HF Cardiologist:  Darius. Aundra [name]  HPI: Darius [name] is a 66 y.o. male with DM, paroxysmal Afib, history of left atrial appendage thrombus (on Eliquis), OSA, chronic systolic CHF, NICM (EF 76%) felt to be tachycardia mediated.   Admitted 06/14/63-0/35/46 for acute systolic CHF and atrial fibrillation with RVR. Had a R/L heart cath on 04/21/16, no CAD, right heart pressures elevated, low output. Echo showed EF 15%. Plan was to undergo TEE/DC-CV, however he was found to have a left atrial appendage thrombus and had only started Eliquis 5 days prior. He was discharged with plans for TEE/DC-CV in about a month. He was started on Entresto 24/26 mg BID during admission. Discharged home on Lasix 40 mg daily, digoxin 0.17m, and metoprolol XL 50 mg BID. Discharge 280 pounds.   Repeat TEE done in 4/18 for possible cardioversion.  EF remained 25-30%.  He still had a small, amorphous LA appendage thrombus (smaller than prior but still present).  He admitted to having missed some Eliquis doses.    He finally had TEE-guided DCCV in 7/18 with conversion to NSR.  TEE showed EF 40-45%.  After this, he went back into atrial fibrilation.  Given poor compliance with followup and recurrent atrial fibrillation, it was decided to leave him in atrial fibrillation as rate was well-controlled.    Echo in 12/21 showed that EF remains 55% with mild LVH, mildly decreased RV systolic function, IVC normal.   Bladder cancer was diagnosed in 11/22 and patient has undergone treatment.   He returns today for followup of CHF and atrial fibrillation.  He remains in atrial fibrillation chronically.  No dyspnea walking on flat ground.  He does get short of breath walking up a hill. No chest pain.  He does not feel palpitations.  Weight is down 9 lbs from last appointment.   ECG (personally reviewed): atrial fibrillation  Cardiac Studies  RHC/LHC 04/21/2016  RA mean 14 RV 45/15 PA  50/28, mean 36 PCWP mean 22 LV 104/20 AO 101/75 Oxygen saturations: PA 59% AO 92% Cardiac Output (Fick) 4.85  Cardiac Index (Fick) 1.89 PVR 2.9 WU No significant coronary disease  - TEE: 04/2016 with LAA thrombus.  - TEE: 4/18 with EF 25-30%, diffuse hypokinesis, RV mildly dilated with moderately decreased systolic function, amorphous thrombus LA appendage (improved compared to 3/18).  -TEE 09/08/2016 EF 40-45% No thrombus. Successful DC-CV  - ECHO 04/24/2016 EF 15% Peak PA pressure 38 mm hg  - ECHO 8/19: EF 55-60% - ECHO 12/21: EF remains 55% with mild LVH, mildly decreased RV systolic function, IVC normal.    Labs  04/24/2016: K 4.5 Creatinine 1.00, digoxin < 0.2 4/18: K 4.3, creatinine 1.02 10/18: Thyroid panel normal, K 4.6, creatinine 1.05, LFTs normal, hgb 14.4 11/18: LDL 42, HDL 62 1/19: K 4.6, creatinine 0.97, LFTs normal, BNP 129 9/21: LDL 65, HDl 53, K 4.9, creatinine 1.24 3/22: K 4.2, creatinine 1.38 4/23: K 4.2, creatinine 1.92  Review of systems complete and found to be negative unless listed in HPI.   SH:  Social History   Socioeconomic History   Marital status: Single    Spouse name: Not on file   Number of children: 0   Years of education: 12   Highest education level: Not on file  Occupational History   Occupation: unemployeed  Tobacco Use   Smoking status: Never   Smokeless tobacco: Never  Vaping Use   Vaping Use: Never  used  Substance and Sexual Activity   Alcohol use: Yes    Comment: some   Drug use: Not Currently   Sexual activity: Not on file  Other Topics Concern   Not on file  Social History Narrative   Admits that he eats poorly and eats a lot at work.      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   Social Determinants of Health   Financial Resource Strain: High Risk (07/30/2020)   Overall Financial Resource Strain (CARDIA)    Difficulty of Paying Living Expenses: Hard  Food Insecurity: Food Insecurity Present  (07/30/2020)   Hunger Vital Sign    Worried About Running Out of Food in the Last Year: Sometimes true    Ran Out of Food in the Last Year: Sometimes true  Transportation Needs: No Transportation Needs (07/30/2020)   PRAPARE - Hydrologist (Medical): No    Lack of Transportation (Non-Medical): No  Physical Activity: Inactive (07/30/2020)   Exercise Vital Sign    Days of Exercise per Week: 0 days    Minutes of Exercise per Session: 0 min  Stress: Stress Concern Present (07/30/2020)   Darius [name]    Feeling of Stress : To some extent  Social Connections: Socially Isolated (07/30/2020)   Social Connection and Isolation Panel [NHANES]    Frequency of Communication with Friends and Family: More than three times a week    Frequency of Social Gatherings with Friends and Family: Never    Attends Religious Services: Never    Marine scientist or Organizations: No    Attends Archivist Meetings: Never    Marital Status: Never married  Intimate Partner Violence: Not At Risk (07/30/2020)   Humiliation, Afraid, Rape, and Kick questionnaire    Fear of Current or Ex-Partner: No    Emotionally Abused: No    Physically Abused: No    Sexually Abused: No    FH:  Family History  Problem Relation Age of Onset   Diabetes Father        died in his 61s   Hypertension Father    Seizures Mother        died in her 59s   Hypertension Sister    Hypertension Paternal Grandfather    Heart attack Neg Hx    Stroke Neg Hx     Past Medical History:  Diagnosis Date   Chronic combined systolic and diastolic heart failure ([place])    followed by cardiologist;  a. Echo 03/27/11: EF 50-55%, inferior hypokinesis, moderate LAE, mild RVE, normal pulmonary pressures. ;   b.  TEE (04/03/11): EF 40-45%, mild MR moderate LAE, no LAA clot, mild RAE;  c.  Echocardiogram (02/2013): EF 60-65%, Gr 2 DD, mildly dilated Ao root  (Ao root dimension 39 mm), MAC, mild LAE, normal RVF   Complication of anesthesia    pt unable to tolerate versed and fentanyl sedation and required general anesthesia with TEE 02/ 2013   Depression    Diabetes mellitus, type 2 ([place])    followed by pcp  (02-21-2021 pt stated does not check blood sugar )   Hx of cardiovascular stress test    Lexiscan Myoview (02/2013): No ischemia or scar, EF 51%, low risk;  cardiac cath 04-21-2016  no CAD, NICM   Malignant neoplasm of urinary bladder [facility]) 12/2020   urologist--- Darius [name];  oncologist-- Darius [name],  high grade muscle  invasion   Morbid obesity ([place])    NICM (nonischemic cardiomyopathy) ([place]) 04/2016   followed by cardiology;   dx 03/ 2018 per TEE ef 15%;  TEE 04/ 2018 ef 25%;  TEE 07/ 2018 ef 40%;  echo 12/ 2021 ef 55%   OSA (obstructive sleep apnea)    followed by Darius [name]--  no cpap/ bipap:: previously followed  by Darius [name] sleep study 02-11-2013  very severe osa w/ AHI 108/hr had used cpap until 2017:   repeat sleep study 02-14-2018 in epic severe osa w/ hypoxemia, AHI 46.3/hr titrated 04-15-2018 used cpap for awhile   Persistent atrial fibrillation ([place]) 03/2011   cardiologist--   s/p TEE-DCCV (failed);  Amiodarone started    Current Outpatient Medications  Medication Sig Dispense Refill   apixaban (ELIQUIS) 5 MG TABS tablet TAKE 1 TABLET (5 MG TOTAL) BY MOUTH 2 (TWO) TIMES DAILY. 60 tablet 0   atorvastatin (LIPITOR) 80 MG tablet TAKE 1 TABLET BY MOUTH AT BEDTIME. 30 tablet 6   empagliflozin (JARDIANCE) 10 MG TABS tablet Take 1 tablet (10 mg total) by mouth daily before breakfast. 90 tablet 3   ezetimibe (ZETIA) 10 MG tablet Take 1 tablet (10 mg total) by mouth daily. 90 tablet 3   metFORMIN (GLUCOPHAGE) 1000 MG tablet Take 1 tablet (1,000 mg total) by mouth 2 (two) times daily with food. 60 tablet 1   metoprolol (TOPROL-XL) 200 MG 24 hr tablet Take 1 tablet (200 mg total) by mouth daily. Take with or immediately following a meal.  90 tablet 1   oxybutynin (DITROPAN) 5 MG tablet Take 1 tablet (5 mg total) by mouth 2 (two) times daily as needed for bladder spasms. 10 tablet 0   oxyCODONE (OXY IR/ROXICODONE) 5 MG immediate release tablet Take 1 tablet  by mouth every 4 hours as needed for moderate pain. 30 tablet 0   prochlorperazine (COMPAZINE) 10 MG tablet Take 1 tablet (10 mg total) by mouth every 6 (six) hours as needed for nausea or vomiting. 30 tablet 0   sacubitril-valsartan (ENTRESTO) 97-103 MG Take 1 tablet by mouth 2 (two) times daily. 60 tablet 3   spironolactone (ALDACTONE) 25 MG tablet Take 1 tablet (25 mg total) by mouth daily. 40 tablet 0   tamsulosin (FLOMAX) 0.4 MG CAPS capsule Take 1 capsule (0.4 mg total) by mouth daily after supper. 30 capsule 3   No current facility-administered medications for this encounter.    Vitals:   12/19/21 1130  BP: 92/60  Pulse: 84  SpO2: 96%  Weight: 134.4 kg (296 lb 6.4 oz)   Wt Readings from Last 3 Encounters:  12/19/21 134.4 kg (296 lb 6.4 oz)  11/14/21 135.1 kg (297 lb 12.8 oz)  08/26/21 128.7 kg (283 lb 12.8 oz)    PHYSICAL EXAM: General: NAD Neck: Thick. No JVD, no thyromegaly or thyroid nodule.  Lungs: Clear to auscultation bilaterally with normal respiratory effort. CV: Nondisplaced PMI.  Heart irregular S1/S2, no S3/S4, no murmur.  Trace ankle edema.  No carotid bruit.  Normal pedal pulses.  Abdomen: Soft, nontender, no hepatosplenomegaly, no distention.  Skin: Intact without lesions or rashes.  Neurologic: Alert and oriented x 3.  Psych: Normal affect. Extremities: No clubbing or cyanosis.  HEENT: Normal.   ASSESSMENT & PLAN:  1. Chronic systolic => diastolic CHF: Had [facility] 08/348 showing minimal CAD.  Nonischemic cardiomyopathy, possibly tachycardia-mediated in the setting of rapid atrial fibrillation.   Echo 04/2016 EF 15% but improved to 40-45% on TEE  in 7/18 and 55-60% by 8/19 echo.  Echo in 12/21 showed that EF remains 55% despite being back in  atrial fibrillation.  He is not volume overloaded on exam, NYHA class II symptoms. - Continue Entresto 97/103 bid.     - Continue spironolactone 25 mg daily. BMET today.  - Continue Toprol XL 200 mg daily.  - Start Farxiga versus Jardiance 10 mg daily.  - I will arrange for echo.  2. Atrial fibrillation: Chronic atrial fibrillation.  Was thought to have tachy-mediated CMP in past, but EF 55% on 12/21 echo in setting of good rate control.  We have decided not to cardiovert him again as he broke through amiodarone and is not particularly compliant.  He is now off amiodarone.  - Continue apixaban 5 mg bid.  CBC today.  3. HLD: Continue statin, check lipids today.  4. OSA: Needs appoint with sleep medicine to get back on CPAP, will set up appt with Darius [name].  5. Diabetes: Per primary, has neuropathy.  6. Bladder cancer: s/p treatment.   Followup in 4 months with APP.    Darius [name] 12/19/2021

## 2021-12-20 ENCOUNTER — Other Ambulatory Visit (HOSPITAL_COMMUNITY): Payer: Self-pay

## 2021-12-30 ENCOUNTER — Ambulatory Visit (HOSPITAL_COMMUNITY)
Admission: RE | Admit: 2021-12-30 | Discharge: 2021-12-30 | Disposition: A | Discharge status: Home / Self Care | Payer: Medicare Other | Source: Ambulatory Visit | Attending: Internal Medicine | Admitting: Internal Medicine

## 2021-12-30 ENCOUNTER — Ambulatory Visit (HOSPITAL_BASED_OUTPATIENT_CLINIC_OR_DEPARTMENT_OTHER)
Admission: RE | Admit: 2021-12-30 | Discharge: 2021-12-30 | Disposition: A | Discharge status: Home / Self Care | Payer: Medicare Other | Source: Ambulatory Visit

## 2021-12-30 DIAGNOSIS — I5032 Chronic diastolic (congestive) heart failure: Secondary | ICD-10-CM

## 2021-12-30 DIAGNOSIS — Z86718 Personal history of other venous thrombosis and embolism: Secondary | ICD-10-CM | POA: Diagnosis not present

## 2021-12-30 DIAGNOSIS — G4730 Sleep apnea, unspecified: Secondary | ICD-10-CM | POA: Insufficient documentation

## 2021-12-30 LAB — BASIC METABOLIC PANEL
Anion gap: 11 (ref 5–15)
BUN: 22 mg/dL (ref 8–23)
CO2: 23 mmol/L (ref 22–32)
Calcium: 8.8 mg/dL — ABNORMAL LOW (ref 8.9–10.3)
Chloride: 103 mmol/L (ref 98–111)
Creatinine, Ser: 1.57 mg/dL — ABNORMAL HIGH (ref 0.61–1.24)
GFR, Estimated: 48 mL/min — ABNORMAL LOW (ref >60)
Glucose, Bld: 218 mg/dL — ABNORMAL HIGH (ref 70–99)
Potassium: 4.6 mmol/L (ref 3.5–5.1)
Sodium: 137 mmol/L (ref 135–145)

## 2021-12-30 LAB — ECHOCARDIOGRAM COMPLETE: S' Lateral: 3.8 cm

## 2022-02-24 ENCOUNTER — Other Ambulatory Visit (HOSPITAL_COMMUNITY): Payer: Self-pay

## 2022-02-24 ENCOUNTER — Other Ambulatory Visit: Payer: Self-pay | Admitting: Urology

## 2022-02-24 ENCOUNTER — Ambulatory Visit (INDEPENDENT_AMBULATORY_CARE_PROVIDER_SITE_OTHER): Payer: 59 | Admitting: Urology

## 2022-02-24 ENCOUNTER — Other Ambulatory Visit: Payer: Self-pay | Admitting: Oncology

## 2022-02-24 ENCOUNTER — Other Ambulatory Visit (HOSPITAL_COMMUNITY): Payer: Self-pay | Admitting: Family Medicine

## 2022-02-24 ENCOUNTER — Other Ambulatory Visit (HOSPITAL_COMMUNITY): Payer: Self-pay | Admitting: Cardiology

## 2022-02-24 ENCOUNTER — Other Ambulatory Visit: Payer: Self-pay | Admitting: Nurse Practitioner

## 2022-02-24 VITALS — BP 80/58 | HR 70 | Ht 74.0 in | Wt 277.8 lb

## 2022-02-24 DIAGNOSIS — E119 Type 2 diabetes mellitus without complications: Secondary | ICD-10-CM

## 2022-02-24 DIAGNOSIS — R350 Frequency of micturition: Secondary | ICD-10-CM | POA: Diagnosis not present

## 2022-02-24 DIAGNOSIS — N401 Enlarged prostate with lower urinary tract symptoms: Secondary | ICD-10-CM | POA: Diagnosis not present

## 2022-02-24 DIAGNOSIS — Z8551 Personal history of malignant neoplasm of bladder: Secondary | ICD-10-CM

## 2022-02-24 DIAGNOSIS — N138 Other obstructive and reflux uropathy: Secondary | ICD-10-CM | POA: Diagnosis not present

## 2022-02-24 LAB — URINALYSIS, ROUTINE W REFLEX MICROSCOPIC
Bilirubin, UA: NEGATIVE
Ketones, UA: NEGATIVE
Leukocytes,UA: NEGATIVE
Nitrite, UA: NEGATIVE
Specific Gravity, UA: <1.005 — ABNORMAL LOW (ref 1.005–1.030)
Urobilinogen, Ur: 0.2 mg/dL (ref 0.2–1.0)
pH, UA: 5.5 (ref 5.0–7.5)

## 2022-02-24 LAB — MICROSCOPIC EXAMINATION: Bacteria, UA: NONE SEEN

## 2022-02-24 MED ORDER — CIPROFLOXACIN HCL 500 MG PO TABS
500.0000 mg | ORAL_TABLET | Freq: Once | ORAL | Status: AC
  Administered 2022-02-24: 500 mg via ORAL

## 2022-02-24 MED ORDER — OXYBUTYNIN CHLORIDE 5 MG PO TABS
5.0000 mg | ORAL_TABLET | Freq: Two times a day (BID) | ORAL | 3 refills | Status: DC | PRN
  Filled 2022-02-24: qty 30, 15d supply, fill #0

## 2022-02-24 MED ORDER — ENTRESTO 97-103 MG PO TABS
1.0000 | ORAL_TABLET | Freq: Two times a day (BID) | ORAL | 3 refills | Status: DC
  Filled 2022-02-24: qty 60, 30d supply, fill #0
  Filled 2022-04-22: qty 60, 30d supply, fill #1
  Filled 2022-06-24: qty 60, 30d supply, fill #2
  Filled 2022-12-25: qty 60, 30d supply, fill #3

## 2022-02-24 MED ORDER — SPIRONOLACTONE 25 MG PO TABS
25.0000 mg | ORAL_TABLET | Freq: Every day | ORAL | 0 refills | Status: DC
  Filled 2022-02-24: qty 40, 40d supply, fill #0

## 2022-02-24 MED ORDER — TAMSULOSIN HCL 0.4 MG PO CAPS
0.4000 mg | ORAL_CAPSULE | Freq: Every day | ORAL | 3 refills | Status: DC
  Filled 2022-02-24 – 2022-02-25 (×2): qty 90, 90d supply, fill #0
  Filled 2022-06-24: qty 90, 90d supply, fill #1
  Filled 2022-12-25: qty 90, 90d supply, fill #2

## 2022-02-24 NOTE — Addendum Note (Signed)
Addended by: Festus Aloe R on: 02/24/2022 04:14 PM   Modules accepted: Orders, Level of Service

## 2022-02-24 NOTE — Progress Notes (Addendum)
02/24/2022 10:21 AM   Darius [name] 10-11-1955 [id number]  Referring provider: Vevelyn Francois, NP [address] [place],  [place] [address]  No chief complaint on file.   HPI:  F/u -      1) bladder cancer-patient diagnosed Nov 2022 with high-grade T2 bladder cancer right bladder, right bladder neck and left high-grade CIS, right bladder wall and superior high-grade T1 disease. In Jan 2023, repeat/max TUR with large patches on the bladder neck, right bladder wall, superior, left bladder wall with residual disease.  From Jan - Mar 2023 he underwent radiation+chemo to preserve bladder.    Primary treatment: Jan - Mar 2023 - max TUR+radiation+chemo   Biopsy/TUR: November 2022-TURBT - HG T2 right, right BN HG CIS, right wall HG T1, superior HG T1, left bladder wall HG CIS Jan 2023 - HGT1, CIS    Staging: Aug 2022 CT abd and pelvis 08/22 - a 2.5 cm right posterior bladder mass. No LAD or bone lesions.  Jul 2023 CT C/A/P - no evidence of disease  Oct 2023 CT C/A/P - NED   Surveillance (NCCN):  CTU or MRU (image upper tracts + axial imaging of abdomen/pelvis) every 3-6 mo x 2 years and consider annually   CT chest (preferred) or chest x-ray every 3-6 mo x 2 years and then annually      AUASS = 14. Urgency and nocturia. He had a sleep study pending from pulmonology.  He has obesity. His Cr was 1.01 Nov 2020 and 1.92 in Apr 2023.   He has a history of A. fib and an atrial thrombus and is on Eliquis. 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 His blood sugar has been high and his Cr was as high as 2.06 Apr 2021 and 1.92 in Apr 2023. Cr 1.57 in Nov 2023.    He returns today in management of bladder cancer and for cystoscopy. He is well with no dysuria or gross hematuria. AUASS = 2. He needs a refill of tamsulosin and oxybutynin.    PMH: Past Medical History:  Diagnosis Date   Chronic combined systolic and diastolic heart failure ([place])    followed by cardiologist;  a.  Echo 03/27/11: EF 50-55%, inferior hypokinesis, moderate LAE, mild RVE, normal pulmonary pressures. ;   b.  TEE (04/03/11): EF 40-45%, mild MR moderate LAE, no LAA clot, mild RAE;  c.  Echocardiogram (02/2013): EF 60-65%, Gr 2 DD, mildly dilated Ao root (Ao root dimension 39 mm), MAC, mild LAE, normal RVF   Complication of anesthesia    pt unable to tolerate versed and fentanyl sedation and required general anesthesia with TEE 02/ 2013   Depression    Diabetes mellitus, type 2 ([place])    followed by pcp  (02-21-2021 pt stated does not check blood sugar )   Hx of cardiovascular stress test    Lexiscan Myoview (02/2013): No ischemia or scar, EF 51%, low risk;  cardiac cath 04-21-2016  no CAD, NICM   Malignant neoplasm of urinary bladder ([place]) 12/2020   urologist--- dr Junious Silk;  oncologist-- dr Alen Blew,  high grade muscle invasion   Morbid obesity ([place])    NICM (nonischemic cardiomyopathy) ([place]) 04/2016   followed by cardiology;   dx 03/ 2018 per TEE ef 15%;  TEE 04/ 2018 ef 25%;  TEE 07/ 2018 ef 40%;  echo 12/ 2021 ef 55%   OSA (obstructive sleep apnea)    followed by dr t. turner--  no cpap/ bipap:: previously  followed  by dr clance sleep study 02-11-2013  very severe osa w/ AHI 108/hr had used cpap until 2017:   repeat sleep study 02-14-2018 in epic severe osa w/ hypoxemia, AHI 46.3/hr titrated 04-15-2018 used cpap for awhile   Persistent atrial fibrillation ([place]) 03/2011   cardiologist--   s/p TEE-DCCV (failed);  Amiodarone started    Surgical History: Past Surgical History:  Procedure Laterality Date   CARDIOVERSION  04/01/2011   Procedure: CARDIOVERSION;  Surgeon: Lelon Perla, MD;  Location: [facility];  Service: Cardiovascular;  Laterality: N/A;   CARDIOVERSION  04/03/2011   Procedure: CARDIOVERSION;  Surgeon: Lelon Perla, MD;  Location: [facility] ENDOSCOPY;  Service: Cardiovascular;  Laterality: N/A;   CARDIOVERSION  04/03/2011   Procedure: CARDIOVERSION;  Surgeon: Lelon Perla, MD;   Location: [facility];  Service: Cardiovascular;  Laterality: N/A;   CARDIOVERSION N/A 06/15/2014   Procedure: CARDIOVERSION;  Surgeon: [name], MD;  Location: [place];  Service: Cardiovascular;  Laterality: N/A;   CARDIOVERSION N/A 09/08/2016   Procedure: CARDIOVERSION;  Surgeon: [name], MD;  Location: [facility] ENDOSCOPY;  Service: Cardiovascular;  Laterality: N/A;   I & D EXTREMITY Right 09/19/2015   Procedure: IRRIGATION AND DEBRIDEMENT EXTREMITY;  Surgeon: Mcarthur Rossetti, MD;  Location: [facility];  Service: Orthopedics;  Laterality: Right;   RIGHT/LEFT HEART CATH AND CORONARY ANGIOGRAPHY N/A 04/21/2016   Procedure: Right/Left Heart Cath and Coronary Angiography;  Surgeon: [name], MD;  Location: [place] CV LAB;  Service: Cardiovascular;  Laterality: N/A;   TEE WITHOUT CARDIOVERSION  04/01/2011   Procedure: TRANSESOPHAGEAL ECHOCARDIOGRAM (TEE);  Surgeon: Lelon Perla, MD;  Location: [facility] ENDOSCOPY;  Service: Cardiovascular;  Laterality: N/A;   TEE WITHOUT CARDIOVERSION  04/03/2011   Procedure: TRANSESOPHAGEAL ECHOCARDIOGRAM (TEE);  Surgeon: Lelon Perla, MD;  Location: [facility] ENDOSCOPY;  Service: Cardiovascular;  Laterality: N/A;   TEE WITHOUT CARDIOVERSION  04/03/2011   Procedure: TRANSESOPHAGEAL ECHOCARDIOGRAM (TEE);  Surgeon: Lelon Perla, MD;  Location: [place];  Service: Cardiovascular;  Laterality: N/A;   TEE WITHOUT CARDIOVERSION N/A 04/24/2016   Procedure: TRANSESOPHAGEAL ECHOCARDIOGRAM (TEE);  Surgeon: [name], MD;  Location: [place];  Service: Cardiovascular;  Laterality: N/A;   TEE WITHOUT CARDIOVERSION N/A 05/26/2016   Procedure: TRANSESOPHAGEAL ECHOCARDIOGRAM (TEE);  Surgeon: [name], MD;  Location: [facility];  Service: Cardiovascular;  Laterality: N/A;   TEE WITHOUT CARDIOVERSION N/A 09/08/2016   Procedure: TRANSESOPHAGEAL ECHOCARDIOGRAM (TEE);  Surgeon: [name], MD;  Location: [facility] ENDOSCOPY;  Service: Cardiovascular;   Laterality: N/A;   TRANSURETHRAL RESECTION OF BLADDER TUMOR N/A 12/14/2020   Procedure: TRANSURETHRAL RESECTION OF BLADDER TUMOR (TURBT) 2-5 cm/ POST OPERTIVE INSTILLATION OF GEMCITABINE;  Surgeon: Festus Aloe, MD;  Location: [facility];  Service: Urology;  Laterality: N/A;   TRANSURETHRAL RESECTION OF BLADDER TUMOR N/A 02/22/2021   Procedure: TRANSURETHRAL RESECTION OF BLADDER TUMOR (TURBT);  Surgeon: Festus Aloe, MD;  Location: [facility];  Service: Urology;  Laterality: N/A;    Home Medications:  Allergies as of 02/24/2022   No Known Allergies      Medication List        Accurate as of February 24, 2022 10:21 AM. If you have any questions, ask your nurse or doctor.          apixaban 5 MG Tabs tablet Commonly known as: ELIQUIS TAKE 1 TABLET (5 MG TOTAL) BY MOUTH 2 (TWO) TIMES DAILY.   atorvastatin 80 MG tablet Commonly  known as: LIPITOR TAKE 1 TABLET BY MOUTH AT BEDTIME.   Entresto 97-103 MG Generic drug: sacubitril-valsartan Take 1 tablet by mouth 2 (two) times daily.   ezetimibe 10 MG tablet Commonly known as: ZETIA Take 1 tablet (10 mg total) by mouth daily.   Jardiance 10 MG Tabs tablet Generic drug: empagliflozin Take 1 tablet (10 mg total) by mouth daily before breakfast.   metFORMIN 1000 MG tablet Commonly known as: GLUCOPHAGE Take 1 tablet (1,000 mg total) by mouth 2 (two) times daily with food.   metoprolol 200 MG 24 hr tablet Commonly known as: TOPROL-XL Take 1 tablet (200 mg total) by mouth daily. Take with or immediately following a meal.   oxybutynin 5 MG tablet Commonly known as: DITROPAN Take 1 tablet (5 mg total) by mouth 2 (two) times daily as needed for bladder spasms.   oxyCODONE 5 MG immediate release tablet Commonly known as: Oxy IR/ROXICODONE Take 1 tablet  by mouth every 4 hours as needed for moderate pain.   prochlorperazine 10 MG tablet Commonly known as: COMPAZINE Take 1 tablet (10 mg  total) by mouth every 6 (six) hours as needed for nausea or vomiting.   spironolactone 25 MG tablet Commonly known as: ALDACTONE Take 1 tablet (25 mg total) by mouth daily.   tamsulosin 0.4 MG Caps capsule Commonly known as: FLOMAX Take 1 capsule (0.4 mg total) by mouth daily after supper.        Allergies: No Known Allergies  Family History: Family History  Problem Relation Age of Onset   Diabetes Father        died in his 36s   Hypertension Father    Seizures Mother        died in her 75s   Hypertension Sister    Hypertension Paternal Grandfather    Heart attack Neg Hx    Stroke Neg Hx     Social History:  reports that he has never smoked. He has never used smokeless tobacco. He reports current alcohol use. He reports that he does not currently use drugs.   Physical Exam: BP (!) 80/58   Pulse 70   Ht _0  (1.88 m)   Wt 277 lb 12.8 oz (126 kg)   BMI 35.67 kg/m   Constitutional:  Alert and oriented, No acute distress. HEENT: [place] AT, moist mucus membranes.  Trachea midline, no masses. Cardiovascular: No clubbing, cyanosis, or edema. Respiratory: Normal respiratory effort, no increased work of breathing. GI: Abdomen is soft, nontender, nondistended, no abdominal masses GU: No CVA tenderness Skin: No rashes, bruises or suspicious lesions. Neurologic: Grossly intact, no focal deficits, moving all 4 extremities. Psychiatric: Normal mood and affect. GU: penis - glans and meatus appear normal. Circumcised.   Cystoscopy Procedure Note  Patient identification was confirmed, informed consent was obtained, and patient was prepped using Betadine solution.  Lidocaine jelly was administered per urethral meatus.     Pre-Procedure: - Inspection reveals a normal caliber ureteral meatus.  Procedure: The flexible cystoscope was introduced without difficulty - No urethral strictures/lesions are present. - normal prostate  - bladder neck - Bilateral ureteral orifices  identified - Bladder mucosa  reveals no ulcers, tumors, or lesions but there is two areas of residual calcification (very small - visible on the Oct 2023 CT). Slight erythema above the left UO. All of this can be c/w prior biopsy.  - No bladder stones - No trabeculation  Retroflexion shows no other findings.    Post-Procedure: - Patient tolerated  the procedure well    Laboratory Data: Lab Results  Component Value Date   WBC 8.7 12/19/2021   HGB 16.5 12/19/2021   HCT 47.8 12/19/2021   MCV 88.2 12/19/2021   PLT 262 12/19/2021    Lab Results  Component Value Date   CREATININE 1.57 (H) 12/30/2021    No results found for: "PSA"  No results found for: "TESTOSTERONE"  Lab Results  Component Value Date   HGBA1C 6.7 (A) 10/20/2019   HGBA1C 6.7 10/20/2019   HGBA1C 6.7 (A) 10/20/2019   HGBA1C 6.7 10/20/2019    Urinalysis    Component Value Date/Time   COLORURINE AMBER (A) 07/23/2020 1500   APPEARANCEUR Clear 11/25/2021 0953   LABSPEC 1.025 07/23/2020 1500   PHURINE 5.0 07/23/2020 1500   GLUCOSEU 2+ (A) 11/25/2021 0953   HGBUR MODERATE (A) 07/23/2020 1500   BILIRUBINUR Negative 11/25/2021 0953   KETONESUR 5 (A) 07/23/2020 1500   PROTEINUR 1+ (A) 11/25/2021 0953   PROTEINUR 100 (A) 07/23/2020 1500   UROBILINOGEN 0.2 10/20/2019 1137   UROBILINOGEN 0.2 02/13/2017 1540   NITRITE Negative 11/25/2021 0953   NITRITE NEGATIVE 07/23/2020 1500   LEUKOCYTESUR 1+ (A) 11/25/2021 0953   LEUKOCYTESUR SMALL (A) 07/23/2020 1500    Lab Results  Component Value Date   LABMICR See below: 11/25/2021   WBCUA 6-10 (A) 11/25/2021   LABEPIT 0-10 11/25/2021   MUCUS Present 08/26/2021   BACTERIA Few (A) 11/25/2021    Pertinent Imaging:    Assessment & Plan:    1. History of bladder cancer Urine sent for cytology - will watch the left bladder area closely with cystoscopy in 3 months.   - Urinalysis, Routine w reflex microscopic - ciprofloxacin (CIPRO) tablet 500 mg  2. Bph -  tams and oxb refilled.   No follow-ups on file.  Festus Aloe, MD  [facility]  [address] [place], [place] [address] [phone]

## 2022-02-25 ENCOUNTER — Other Ambulatory Visit (HOSPITAL_COMMUNITY): Payer: Self-pay

## 2022-02-25 LAB — CYTOLOGY, URINE

## 2022-03-04 ENCOUNTER — Other Ambulatory Visit (HOSPITAL_COMMUNITY): Payer: Self-pay

## 2022-03-28 ENCOUNTER — Encounter: Payer: Self-pay | Admitting: Cardiology

## 2022-03-28 ENCOUNTER — Ambulatory Visit: Payer: 59 | Attending: Cardiology | Admitting: Cardiology

## 2022-03-28 VITALS — BP 110/58 | HR 53 | Ht 74.0 in | Wt 266.2 lb

## 2022-03-28 DIAGNOSIS — G4733 Obstructive sleep apnea (adult) (pediatric): Secondary | ICD-10-CM | POA: Diagnosis not present

## 2022-03-28 NOTE — Addendum Note (Signed)
Addended by: Joni Reining on: 03/28/2022 01:36 PM   Modules accepted: Orders

## 2022-03-28 NOTE — Progress Notes (Signed)
Sleep Medicine CONSULT Note    Date:  03/28/2022   ID:  Darius [name], DOB May 31, 1955, MRN [id number]  PCP:  Vevelyn Francois, NP  Cardiologist: Loralie Champagne, MD   Chief Complaint  Patient presents with   New Patient (Initial Visit)    OSA    History of Present Illness:  Darius [name] is a 67 y.o. male who is being seen today for the evaluation of OSA at the request of Loralie Champagne, MD.  This is a 67yo male with a hx of obstructive sleep apnea noncompliant with CPAP.  He has a hx of obstructive sleep apnea that was severe by a PSG back in 2015 with an AHI of 107.6/h.  He was placed on CPAP at that time and was followed briefly by Dr. Gwenette Greet until he retired.  He then did not follow-up with any other sleep doctor.   I saw him in consultation in 2019 to get back on CPAP since he had stopped using his device.  A split night sleep study was ordered which was done 02/2018 and showed severe OSA with an [facility] of 46/hr and nocturnal hypoxemia and then underwent CPAP titration and was placed on CPAP at 10cm H2O but then had bladder Ca and was getting treatment and said he could no way use the PAP at that time.  Also CPAP was not covered by insurance because he had not had an in office apt so they required a repeat split night sleep study after a new OV at that time but never followed through.  He is now referred back for new Sleep Medicine consult to establish sleep care to get set back up on CPAP therapy. He always feels sleepy when he gets up in the am.  He does not need to nap during the day.  He thinks he snores but sleeps by himself.   Past Medical History:  Diagnosis Date   Chronic combined systolic and diastolic heart failure ([place])    followed by cardiologist;  a. Echo 03/27/11: EF 50-55%, inferior hypokinesis, moderate LAE, mild RVE, normal pulmonary pressures. ;   b.  TEE (04/03/11): EF 40-45%, mild MR moderate LAE, no LAA clot, mild RAE;  c.  Echocardiogram (02/2013): EF 60-65%, Gr 2  DD, mildly dilated Ao root (Ao root dimension 39 mm), MAC, mild LAE, normal RVF   Complication of anesthesia    pt unable to tolerate versed and fentanyl sedation and required general anesthesia with TEE 02/ 2013   Depression    Diabetes mellitus, type 2 ([place])    followed by pcp  (02-21-2021 pt stated does not check blood sugar )   Hx of cardiovascular stress test    Lexiscan Myoview (02/2013): No ischemia or scar, EF 51%, low risk;  cardiac cath 04-21-2016  no CAD, NICM   Malignant neoplasm of urinary bladder ([place]) 12/2020   urologist--- dr Junious Silk;  oncologist-- dr Alen Blew,  high grade muscle invasion   Morbid obesity ([place])    NICM (nonischemic cardiomyopathy) ([place]) 04/2016   followed by cardiology;   dx 03/ 2018 per TEE ef 15%;  TEE 04/ 2018 ef 25%;  TEE 07/ 2018 ef 40%;  echo 12/ 2021 ef 55%   OSA (obstructive sleep apnea)    followed by dr t. Ashely Goosby--  no cpap/ bipap:: previously followed  by dr clance sleep study 02-11-2013  very severe osa w/ AHI 108/hr had used cpap until 2017:   repeat sleep study 02-14-2018  in epic severe osa w/ hypoxemia, AHI 46.3/hr titrated 04-15-2018 used cpap for awhile   Persistent atrial fibrillation ([place]) 03/2011   cardiologist--   s/p TEE-DCCV (failed);  Amiodarone started    Past Surgical History:  Procedure Laterality Date   CARDIOVERSION  04/01/2011   Procedure: CARDIOVERSION;  Surgeon: Lelon Perla, MD;  Location: [place];  Service: Cardiovascular;  Laterality: N/A;   CARDIOVERSION  04/03/2011   Procedure: CARDIOVERSION;  Surgeon: Lelon Perla, MD;  Location: [facility] ENDOSCOPY;  Service: Cardiovascular;  Laterality: N/A;   CARDIOVERSION  04/03/2011   Procedure: CARDIOVERSION;  Surgeon: Lelon Perla, MD;  Location: [place];  Service: Cardiovascular;  Laterality: N/A;   CARDIOVERSION N/A 06/15/2014   Procedure: CARDIOVERSION;  Surgeon: [name], MD;  Location: [place];  Service: Cardiovascular;  Laterality: N/A;   CARDIOVERSION N/A  09/08/2016   Procedure: CARDIOVERSION;  Surgeon: [name], MD;  Location: [facility] ENDOSCOPY;  Service: Cardiovascular;  Laterality: N/A;   I & D EXTREMITY Right 09/19/2015   Procedure: IRRIGATION AND DEBRIDEMENT EXTREMITY;  Surgeon: Mcarthur Rossetti, MD;  Location: [place];  Service: Orthopedics;  Laterality: Right;   RIGHT/LEFT HEART CATH AND CORONARY ANGIOGRAPHY N/A 04/21/2016   Procedure: Right/Left Heart Cath and Coronary Angiography;  Surgeon: [name], MD;  Location: [place] CV LAB;  Service: Cardiovascular;  Laterality: N/A;   TEE WITHOUT CARDIOVERSION  04/01/2011   Procedure: TRANSESOPHAGEAL ECHOCARDIOGRAM (TEE);  Surgeon: Lelon Perla, MD;  Location: [facility] ENDOSCOPY;  Service: Cardiovascular;  Laterality: N/A;   TEE WITHOUT CARDIOVERSION  04/03/2011   Procedure: TRANSESOPHAGEAL ECHOCARDIOGRAM (TEE);  Surgeon: Lelon Perla, MD;  Location: [facility] ENDOSCOPY;  Service: Cardiovascular;  Laterality: N/A;   TEE WITHOUT CARDIOVERSION  04/03/2011   Procedure: TRANSESOPHAGEAL ECHOCARDIOGRAM (TEE);  Surgeon: Lelon Perla, MD;  Location: [place];  Service: Cardiovascular;  Laterality: N/A;   TEE WITHOUT CARDIOVERSION N/A 04/24/2016   Procedure: TRANSESOPHAGEAL ECHOCARDIOGRAM (TEE);  Surgeon: [name], MD;  Location: [place];  Service: Cardiovascular;  Laterality: N/A;   TEE WITHOUT CARDIOVERSION N/A 05/26/2016   Procedure: TRANSESOPHAGEAL ECHOCARDIOGRAM (TEE);  Surgeon: [name], MD;  Location: [place];  Service: Cardiovascular;  Laterality: N/A;   TEE WITHOUT CARDIOVERSION N/A 09/08/2016   Procedure: TRANSESOPHAGEAL ECHOCARDIOGRAM (TEE);  Surgeon: [name], MD;  Location: [facility] ENDOSCOPY;  Service: Cardiovascular;  Laterality: N/A;   TRANSURETHRAL RESECTION OF BLADDER TUMOR N/A 12/14/2020   Procedure: TRANSURETHRAL RESECTION OF BLADDER TUMOR (TURBT) 2-5 cm/ POST OPERTIVE INSTILLATION OF GEMCITABINE;  Surgeon: Festus Aloe, MD;  Location: [facility];  Service: Urology;  Laterality: N/A;   TRANSURETHRAL RESECTION OF BLADDER TUMOR N/A 02/22/2021   Procedure: TRANSURETHRAL RESECTION OF BLADDER TUMOR (TURBT);  Surgeon: Festus Aloe, MD;  Location: [facility];  Service: Urology;  Laterality: N/A;    Current Medications: Current Meds  Medication Sig   apixaban (ELIQUIS) 5 MG TABS tablet TAKE 1 TABLET (5 MG TOTAL) BY MOUTH 2 (TWO) TIMES DAILY.   atorvastatin (LIPITOR) 80 MG tablet TAKE 1 TABLET BY MOUTH AT BEDTIME.   empagliflozin (JARDIANCE) 10 MG TABS tablet Take 1 tablet (10 mg total) by mouth daily before breakfast.   ezetimibe (ZETIA) 10 MG tablet Take 1 tablet (10 mg total) by mouth daily.   metFORMIN (GLUCOPHAGE) 1000 MG tablet Take 1 tablet (1,000 mg total) by mouth 2 (two) times daily with food.   metoprolol (TOPROL-XL) 200 MG 24 hr tablet Take 1 tablet (  200 mg total) by mouth daily. Take with or immediately following a meal.   oxybutynin (DITROPAN) 5 MG tablet Take 1 tablet (5 mg total) by mouth 2 (two) times daily as needed for bladder spasms.   oxyCODONE (OXY IR/ROXICODONE) 5 MG immediate release tablet Take 1 tablet  by mouth every 4 hours as needed for moderate pain.   prochlorperazine (COMPAZINE) 10 MG tablet Take 1 tablet (10 mg total) by mouth every 6 (six) hours as needed for nausea or vomiting.   sacubitril-valsartan (ENTRESTO) 97-103 MG Take 1 tablet by mouth 2 (two) times daily.   spironolactone (ALDACTONE) 25 MG tablet Take 1 tablet (25 mg total) by mouth daily.   tamsulosin (FLOMAX) 0.4 MG CAPS capsule Take 1 capsule (0.4 mg total) by mouth daily after supper.    Allergies:   Patient has no known allergies.   Social History   Socioeconomic History   Marital status: Single    Spouse name: Not on file   Number of children: 0   Years of education: 12   Highest education level: Not on file  Occupational History   Occupation: unemployeed  Tobacco Use   Smoking status: Never    Smokeless tobacco: Never  Vaping Use   Vaping Use: Never used  Substance and Sexual Activity   Alcohol use: Yes    Comment: some   Drug use: Not Currently   Sexual activity: Not on file  Other Topics Concern   Not on file  Social History Narrative   Admits that he eats poorly and eats a lot at work.      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   Social Determinants of Health   Financial Resource Strain: High Risk (07/30/2020)   Overall Financial Resource Strain (CARDIA)    Difficulty of Paying Living Expenses: Hard  Food Insecurity: Food Insecurity Present (07/30/2020)   Hunger Vital Sign    Worried About Running Out of Food in the Last Year: Sometimes true    Ran Out of Food in the Last Year: Sometimes true  Transportation Needs: No Transportation Needs (07/30/2020)   PRAPARE - Hydrologist (Medical): No    Lack of Transportation (Non-Medical): No  Physical Activity: Inactive (07/30/2020)   Exercise Vital Sign    Days of Exercise per Week: 0 days    Minutes of Exercise per Session: 0 min  Stress: Stress Concern Present (07/30/2020)   [place]    Feeling of Stress : To some extent  Social Connections: Socially Isolated (07/30/2020)   Social Connection and Isolation Panel [NHANES]    Frequency of Communication with Friends and Family: More than three times a week    Frequency of Social Gatherings with Friends and Family: Never    Attends Religious Services: Never    Marine scientist or Organizations: No    Attends Music therapist: Never    Marital Status: Never married     Family History:  The patient's family history includes Diabetes in his father; Hypertension in his father, paternal grandfather, and sister; Seizures in his mother.   ROS:   Please see the history of present illness.    ROS All other systems reviewed and are  negative.      No data to display             PHYSICAL EXAM:   VS:  BP (!) 110/58  Pulse (!) 53   Ht 6' 2"$  (1.88 m)   Wt 266 lb 3.2 oz (120.7 kg)   SpO2 96%   BMI 34.18 kg/m    GEN: Well nourished, well developed, in no acute distress  HEENT: normal  Neck: no JVD, carotid bruits, or masses Cardiac: RRR; no murmurs, rubs, or gallops,no edema.  Intact distal pulses bilaterally.  Respiratory:  clear to auscultation bilaterally, normal work of breathing GI: soft, nontender, nondistended, + BS MS: no deformity or atrophy  Skin: warm and dry, no rash Neuro:  Alert and Oriented x 3, Strength and sensation are intact Psych: euthymic mood, full affect  Wt Readings from Last 3 Encounters:  03/28/22 266 lb 3.2 oz (120.7 kg)  02/24/22 277 lb 12.8 oz (126 kg)  12/19/21 296 lb 6.4 oz (134.4 kg)      Studies/Labs Reviewed:   Split night sleep study and CPAP titration  Recent Labs: 05/14/2021: ALT 16 12/19/2021: Hemoglobin 16.5; Platelets 262 12/30/2021: BUN 22; Creatinine, Ser 1.57; Potassium 4.6; Sodium 137    Additional studies/ records that were reviewed today include:  none    ASSESSMENT:    1. OSA - C-pap      PLAN:  In order of problems listed above:  OSA  -He has a hx of obstructive sleep apnea that was severe by a PSG back in 2015 with an AHI of 107.6/hr but stopped using it about 2 years after starting -A split night sleep study was ordered which was done 02/2018 and showed severe OSA with an [facility] of 46/hr and nocturnal hypoxemia and then underwent CPAP titration and CPAP at 10cm H2O was ordered but because he had not been seen recently prior to that sleep study insurance would not pay for the CPAP so an OV was recommended but He never followed up . -will get a split night sleep study and get back on CPAP  Time Spent: 20 minutes total time of encounter, including 15 minutes spent in face-to-face patient care on the date of this encounter. This time includes  coordination of care and counseling regarding above mentioned problem list. Remainder of non-face-to-face time involved reviewing chart documents/testing relevant to the patient encounter and documentation in the medical record. I have independently reviewed documentation from referring provider  Medication Adjustments/Labs and Tests Ordered: Current medicines are reviewed at length with the patient today.  Concerns regarding medicines are outlined above.  Medication changes, Labs and Tests ordered today are listed in the Patient Instructions below.  There are no Patient Instructions on file for this visit.   Signed, Fransico Him, MD  03/28/2022 1:27 PM    [facility] [address], [place], [place]  [address] Phone: [phone]; Fax: [phone]

## 2022-03-28 NOTE — Patient Instructions (Signed)
Medication Instructions:  Your physician recommends that you continue on your current medications as directed. Please refer to the Current Medication list given to you today.  *If you need a refill on your cardiac medications before your next appointment, please call your pharmacy*   Lab Work: None.  If you have labs (blood work) drawn today and your tests are completely normal, you will receive your results only by: [place] (if you have MyChart) OR A paper copy in the mail If you have any lab test that is abnormal or we need to change your treatment, we will call you to review the results.   Testing/Procedures: Your physician has ordered a split night sleep study. A member of our sleep team will be calling you to coordinate this procedure.    Follow-Up:  We recommend signing up for the patient portal called "MyChart".  Sign up information is provided on this After Visit Summary.  MyChart is used to connect with patients for Virtual Visits (Telemedicine).  Patients are able to view lab/test results, encounter notes, upcoming appointments, etc.  Non-urgent messages can be sent to your provider as well.   To learn more about what you can do with MyChart, go to [email or web address].    Your next appointment will be dependent up on the results of your split night sleep study and it will be with:     Provider:   Dr. Fransico Him, MD

## 2022-03-31 ENCOUNTER — Other Ambulatory Visit (HOSPITAL_COMMUNITY): Payer: Self-pay

## 2022-04-22 ENCOUNTER — Other Ambulatory Visit (HOSPITAL_COMMUNITY): Payer: Self-pay

## 2022-04-22 ENCOUNTER — Other Ambulatory Visit (HOSPITAL_COMMUNITY): Payer: Self-pay | Admitting: Cardiology

## 2022-04-22 MED ORDER — SPIRONOLACTONE 25 MG PO TABS
25.0000 mg | ORAL_TABLET | Freq: Every day | ORAL | 0 refills | Status: DC
  Filled 2022-04-22: qty 40, 40d supply, fill #0

## 2022-05-26 ENCOUNTER — Encounter: Payer: Self-pay | Admitting: Urology

## 2022-05-26 ENCOUNTER — Ambulatory Visit (INDEPENDENT_AMBULATORY_CARE_PROVIDER_SITE_OTHER): Payer: 59 | Admitting: Urology

## 2022-05-26 VITALS — BP 117/79 | HR 56

## 2022-05-26 DIAGNOSIS — Z8551 Personal history of malignant neoplasm of bladder: Secondary | ICD-10-CM

## 2022-05-26 DIAGNOSIS — C678 Malignant neoplasm of overlapping sites of bladder: Secondary | ICD-10-CM

## 2022-05-26 LAB — URINALYSIS, ROUTINE W REFLEX MICROSCOPIC
Bilirubin, UA: NEGATIVE
Glucose, UA: NEGATIVE
Ketones, UA: NEGATIVE
Leukocytes,UA: NEGATIVE
Nitrite, UA: NEGATIVE
Protein,UA: NEGATIVE
Specific Gravity, UA: <1.005 — ABNORMAL LOW (ref 1.005–1.030)
Urobilinogen, Ur: 0.2 mg/dL (ref 0.2–1.0)
pH, UA: 5 (ref 5.0–7.5)

## 2022-05-26 LAB — MICROSCOPIC EXAMINATION: Bacteria, UA: NONE SEEN

## 2022-05-26 MED ORDER — CIPROFLOXACIN HCL 500 MG PO TABS
500.0000 mg | ORAL_TABLET | Freq: Once | ORAL | Status: AC
  Administered 2022-05-26: 500 mg via ORAL

## 2022-05-26 NOTE — Progress Notes (Signed)
  [place]  05/26/22  CC: No chief complaint on file.   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 In Jan 2023, repeat/max TUR with large patches on the bladder neck, right bladder wall, superior, left bladder wall with residual disease.  From Jan - Mar 2023 he underwent radiation+chemo to preserve bladder.    Primary treatment: Jan - Mar 2023 - max TUR+radiation+chemo   Biopsy/TUR: November 2022-TURBT - HG T2 right, right BN HG CIS, right wall HG T1, superior HG T1, left bladder wall HG CIS Jan 2023 - HGT1, CIS    Staging: Aug 2022 CT abd and pelvis 08/22 - a 2.5 cm right posterior bladder mass. No LAD or bone lesions.  Jul 2023 CT C/A/P - no evidence of disease  Oct 2023 CT C/A/P - NED   Surveillance (NCCN):  CTU or MRU (image upper tracts + axial imaging of abdomen/pelvis) every 3-6 mo x 2 years and consider annually   CT chest (preferred) or chest x-ray every 3-6 mo x 2 years and then annually    2) freq, urgency - AUASS = 14. Urgency and nocturia. On tamsulosin and oxybutynin. He had a sleep study pending from pulmonology.  He has obesity. His Cr was 1.01 Nov 2020 and 1.92 in Apr 2023. AuASS = 2.    He has a history of A. fib and an atrial thrombus and is on Eliquis. 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 His blood sugar has been high and his Cr was as high as 2.06 Apr 2021 and 1.92 in Apr 2023. Cr 1.57 in Nov 2023.    He returns today in management of bladder cancer and for cystoscopy. Cysto benign.   Blood pressure 117/79, pulse (!) 56. NED. A&Ox3.   No respiratory distress   Abd soft, NT, ND Normal phallus with bilateral descended testicles  Cystoscopy Procedure Note  Patient identification was confirmed, informed consent was obtained, and patient was prepped using Betadine solution.  Lidocaine  jelly was administered per urethral meatus.     Pre-Procedure: - Inspection reveals a normal caliber ureteral meatus.  Procedure: The flexible cystoscope was introduced without difficulty - No urethral strictures/lesions are present. - moderate hyperplasia and borderline obstructing prostate, no median lobe - normal bladder neck - Bilateral ureteral orifices identified - Bladder mucosa  reveals no ulcers, tumors, or lesions - very clear visual. Bladder looks good. Small calcification adherent to left bladder wall.  - No bladder stones - No trabeculation  Retroflexion shows normal bladder.    Post-Procedure: - Patient tolerated the procedure well  Assessment/ Plan:  H/o bladder cancer - due for CT C/A/P - f/u 3 months for cystoscopy.   No follow-ups on file.  Jerilee Field, MD

## 2022-05-26 NOTE — Addendum Note (Signed)
Addended by: Christoper Fabian R on: 05/26/2022 02:37 PM   Modules accepted: Orders

## 2022-06-03 ENCOUNTER — Ambulatory Visit (HOSPITAL_BASED_OUTPATIENT_CLINIC_OR_DEPARTMENT_OTHER): Payer: 59 | Attending: Cardiology | Admitting: Cardiology

## 2022-06-03 DIAGNOSIS — R0683 Snoring: Secondary | ICD-10-CM | POA: Insufficient documentation

## 2022-06-03 DIAGNOSIS — I493 Ventricular premature depolarization: Secondary | ICD-10-CM | POA: Diagnosis not present

## 2022-06-03 DIAGNOSIS — I4891 Unspecified atrial fibrillation: Secondary | ICD-10-CM | POA: Insufficient documentation

## 2022-06-03 DIAGNOSIS — G4733 Obstructive sleep apnea (adult) (pediatric): Secondary | ICD-10-CM | POA: Diagnosis not present

## 2022-06-04 NOTE — Procedures (Signed)
   Patient Name: Darius [name], Darius [name] Date:06/03/2022 Gender: Male D.O.B: 13-Mar-1955 Age (years): 89 Referring Provider: Armanda Magic MD, ABSM Height (inches): 74 Interpreting Physician: Armanda Magic MD, ABSM Weight (lbs): 251.20 RPSGT: Ulyess Mort BMI: 32 MRN: [id number] Neck Size: 16.00  CLINICAL INFORMATION Sleep Study Type: NPSG  Indication for sleep study: Diabetes, Excessive Daytime Sleepiness, Hypertension, Obesity, OSA  Epworth Sleepiness Score: 4  Most recent polysomnogram dated 02/14/2018 revealed an AHI of 46.3/h. Most recent titration study dated 04/15/2018 was optimal at 10cm H2O with an AHI of 0/h.  SLEEP STUDY TECHNIQUE As per the AASM Manual for the Scoring of Sleep and Associated Events v2.3 (April 2016) with a hypopnea requiring 4% desaturations.  The channels recorded and monitored were frontal, central and occipital EEG, electrooculogram (EOG), submentalis EMG (chin), nasal and oral airflow, thoracic and abdominal wall motion, anterior tibialis EMG, snore microphone, electrocardiogram, and pulse oximetry.  MEDICATIONS Medications self-administered by patient taken the night of the study : N/A  SLEEP ARCHITECTURE The study was initiated at 11:04:33 PM and ended at 5:07:55 AM.  Sleep onset time was 146.7 minutes and the sleep efficiency was 39.2%. The total sleep time was 142.5 minutes.  Stage REM latency was 110.5 minutes.  The patient spent 9.5% of the night in stage N1 sleep, 83.9% in stage N2 sleep, 0.0% in stage N3 and 6.7% in REM.  Alpha intrusion was absent.  Supine sleep was 5.61%.  RESPIRATORY PARAMETERS The overall apnea/hypopnea index (AHI) was 2.5 per hour. There were 1 total apneas, including 1 obstructive, 0 central and 0 mixed apneas. There were 5 hypopneas and 185 RERAs.  The AHI during Stage REM sleep was 6.3 per hour.  AHI while supine was 15.0 per hour.  The mean oxygen saturation was 92.6%. The minimum SpO2 during sleep was  89.0%.  soft snoring was noted during this study.  CARDIAC DATA The 2 lead EKG demonstrated atrial fibrillation. The mean heart rate was 79.1 beats per minute. Other EKG findings include: PVCs.  LEG MOVEMENT DATA The total PLMS were 0 with a resulting PLMS index of 0.0. Associated arousal with leg movement index was 3.8 .  IMPRESSIONS - No significant obstructive sleep apnea occurred during this study (AHI = 2.5/h). - The patient had minimal or no oxygen desaturation during the study (Min O2 = 89.0%) - The patient snored with soft snoring volume. - EKG findings include PVCs and atrial fibrillation. - Clinically significant periodic limb movements did not occur during sleep. No significant associated arousals.  DIAGNOSIS - Nondiagnostic study due to reduced sleep time and lack of REM sleep  RECOMMENDATIONS - Recommend [facility] sleep study. - Positional therapy avoiding supine position during sleep. - Avoid alcohol, sedatives and other CNS depressants that may worsen sleep apnea and disrupt normal sleep architecture. - Sleep hygiene should be reviewed to assess factors that may improve sleep quality. - Weight management and regular exercise should be initiated or continued if appropriate.  [Electronically signed] 06/05/2022 08:10 AM  Armanda Magic MD, ABSM Diplomate, American Board of Sleep Medicine

## 2022-06-24 ENCOUNTER — Other Ambulatory Visit (HOSPITAL_COMMUNITY): Payer: Self-pay

## 2022-06-24 ENCOUNTER — Other Ambulatory Visit (HOSPITAL_COMMUNITY): Payer: Self-pay | Admitting: Cardiology

## 2022-06-24 MED ORDER — SPIRONOLACTONE 25 MG PO TABS
25.0000 mg | ORAL_TABLET | Freq: Every day | ORAL | 0 refills | Status: DC
  Filled 2022-06-24: qty 90, 90d supply, fill #0

## 2022-07-04 ENCOUNTER — Telehealth: Payer: Self-pay | Admitting: *Deleted

## 2022-07-04 NOTE — Telephone Encounter (Signed)
The patient has been notified of the result. Left detailed message on voicemail and informed patient to call back..Montrail Mehrer Green, CMA   

## 2022-07-04 NOTE — Telephone Encounter (Signed)
-----   Message from Gaynelle Cage, CMA sent at 06/05/2022 10:17 AM EDT -----  ----- Message ----- From: Quintella Reichert, MD Sent: 06/05/2022   8:12 AM EDT To: Cv Div Sleep Studies  Nondiagnostic sleep study due to lack of adequate sleep and REM sleep - please order [facility] sleep study

## 2022-07-14 ENCOUNTER — Encounter: Payer: Self-pay | Admitting: Urology

## 2022-07-14 ENCOUNTER — Ambulatory Visit: Payer: 59 | Admitting: Urology

## 2022-07-14 VITALS — BP 108/75 | HR 51

## 2022-07-16 NOTE — Telephone Encounter (Signed)
The patient has been notified of the result. Left detailed message on voicemail and informed patient to call back..Francis Yardley Green, CMA   

## 2022-07-25 ENCOUNTER — Other Ambulatory Visit (HOSPITAL_COMMUNITY): Payer: Self-pay

## 2022-08-25 ENCOUNTER — Other Ambulatory Visit (HOSPITAL_COMMUNITY): Payer: Self-pay

## 2022-08-25 ENCOUNTER — Ambulatory Visit: Payer: 59 | Admitting: Urology

## 2022-08-26 ENCOUNTER — Other Ambulatory Visit (HOSPITAL_COMMUNITY): Payer: Self-pay

## 2022-09-15 ENCOUNTER — Encounter: Payer: Self-pay | Admitting: Urology

## 2022-09-15 ENCOUNTER — Ambulatory Visit (INDEPENDENT_AMBULATORY_CARE_PROVIDER_SITE_OTHER): Payer: 59 | Admitting: Urology

## 2022-09-15 VITALS — BP 132/85 | HR 80

## 2022-09-15 DIAGNOSIS — Z8551 Personal history of malignant neoplasm of bladder: Secondary | ICD-10-CM | POA: Diagnosis not present

## 2022-09-15 LAB — URINALYSIS, ROUTINE W REFLEX MICROSCOPIC
Bilirubin, UA: NEGATIVE
Ketones, UA: NEGATIVE
Leukocytes,UA: NEGATIVE
Nitrite, UA: NEGATIVE
Protein,UA: NEGATIVE
RBC, UA: NEGATIVE
Specific Gravity, UA: 1.025 (ref 1.005–1.030)
Urobilinogen, Ur: 1 mg/dL (ref 0.2–1.0)
pH, UA: 6 (ref 5.0–7.5)

## 2022-09-15 MED ORDER — CIPROFLOXACIN HCL 500 MG PO TABS
500.0000 mg | ORAL_TABLET | Freq: Once | ORAL | Status: AC
  Administered 2022-09-15: 500 mg via ORAL

## 2022-09-15 NOTE — Progress Notes (Unsigned)
    09/15/22  CC: No chief complaint on file.   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 In Jan 2023, repeat/max TUR with large patches on the bladder neck, right bladder wall, superior, left bladder wall with residual disease.  From Jan - Mar 2023 he underwent radiation+chemo to preserve bladder.    Primary treatment: Jan - Mar 2023 - max TUR+radiation+chemo   Biopsy/TUR: November 2022-TURBT - HG T2 right, right BN HG CIS, right wall HG T1, superior HG T1, left bladder wall HG CIS Jan 2023 - HGT1, CIS    Staging: Aug 2022 CT abd and pelvis 08/22 - a 2.5 cm right posterior bladder mass. No LAD or bone lesions.  Jul 2023 CT C/A/P - no evidence of disease  Oct 2023 CT C/A/P - NED   Surveillance (NCCN):  CTU or MRU (image upper tracts + axial imaging of abdomen/pelvis) every 3-6 mo x 2 years and consider annually   CT chest (preferred) or chest x-ray every 3-6 mo x 2 years and then annually    2) freq, urgency - AUASS = 14. Urgency and nocturia. On tamsulosin and oxybutynin. He had a sleep study pending from pulmonology.  He has obesity. His Cr was 1.01 Nov 2020 and 1.92 in Apr 2023. AuASS = 2.    He has a history of A. fib and an atrial thrombus and is on Eliquis. 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 His blood sugar has been high and his Cr was as high as 2.06 Apr 2021 and 1.92 in Apr 2023. Cr 1.57 in Nov 2023.    He returns today in management of bladder cancer and for cystoscopy. I ordered a CT but it hasn't been done.   There were no vitals taken for this visit. NED. A&Ox3.   No respiratory distress   Abd soft, NT, ND Normal phallus with bilateral descended testicles  Cystoscopy Procedure Note  Patient identification was confirmed, informed consent was obtained, and patient was prepped using Betadine  solution.  Lidocaine jelly was administered per urethral meatus.     Pre-Procedure: - Inspection reveals a normal caliber ureteral meatus.  Procedure: The flexible cystoscope was introduced without difficulty - No urethral strictures/lesions are present. - normal prostate without BOO  - normal bladder neck - Bilateral ureteral orifices identified - Bladder mucosa  reveals no ulcers, tumors, or lesions - there is a thicker glomerulation developing on right lateral bladder. No papillary component and no erythema or plaque.  - No bladder stones - No trabeculation  Retroflexion shows normal bladder neck    Post-Procedure: - Patient tolerated the procedure well  Assessment/ Plan:  H/o T2 bladder ca and CIS - discussed importance of scheduling CT scans. F/u in 3 months for cystoscopy.    Jerilee Field, MD

## 2022-10-17 NOTE — Telephone Encounter (Signed)
The patient has been notified of the result. Left detailed message on voicemail and informed patient to call back..Jones, Dorothea Green, CMA   

## 2022-12-09 ENCOUNTER — Ambulatory Visit (HOSPITAL_COMMUNITY)
Admission: RE | Admit: 2022-12-09 | Discharge: 2022-12-09 | Disposition: A | Discharge status: Home / Self Care | Payer: 59 | Source: Ambulatory Visit | Attending: Urology | Admitting: Urology

## 2022-12-09 ENCOUNTER — Other Ambulatory Visit: Payer: Self-pay | Admitting: Pharmacist

## 2022-12-09 DIAGNOSIS — Z8551 Personal history of malignant neoplasm of bladder: Secondary | ICD-10-CM | POA: Diagnosis present

## 2022-12-09 DIAGNOSIS — I700 Atherosclerosis of aorta: Secondary | ICD-10-CM | POA: Insufficient documentation

## 2022-12-09 DIAGNOSIS — I2510 Atherosclerotic heart disease of native coronary artery without angina pectoris: Secondary | ICD-10-CM | POA: Insufficient documentation

## 2022-12-09 DIAGNOSIS — K8020 Calculus of gallbladder without cholecystitis without obstruction: Secondary | ICD-10-CM | POA: Diagnosis not present

## 2022-12-09 NOTE — Progress Notes (Signed)
Pharmacy Quality Measure Review  This patient is appearing on a report for being at risk of failing the adherence measure for diabetes medications this calendar year.   Medication: Jardiance 10 mg Last fill date: 7/17 for 90 day supply  Discussed barriers to adherence, which included patient unsure of reasons for medications and administration instructions and Reviewed medication indication, dosing, and goals of therapy. Patient was very talkative which made it difficult to discuss medications. Reported he takes Jardiance, but misses doses if he can't take it with food. Explained that Jardiance does not need to be taken with food. In the end, discussed that he needs to re-establish care with a PCP and his cardiologist, Dr. [name]. Patient reported he has been wanting to see a new provider at [facility] and I provided him with phone number to call and schedule appointment. Also instructed him to call Dr. Alford Highland office to schedule appointment. Patient would benefit from pharmacist care as well to help him understand indications and proper administration of medications and improve adherence.  Jarrett Ables, PharmD PGY-1 Pharmacy Resident

## 2022-12-10 NOTE — Progress Notes (Signed)
Called patient to schedule appointment,left voicemail to call our office back.

## 2022-12-15 ENCOUNTER — Other Ambulatory Visit: Payer: 59 | Admitting: Urology

## 2022-12-15 NOTE — Progress Notes (Signed)
Hello called patient and left a message that he can give the office back so we can schedule him a new patient appointment in our office.

## 2022-12-18 ENCOUNTER — Other Ambulatory Visit (HOSPITAL_COMMUNITY): Payer: Self-pay

## 2022-12-22 ENCOUNTER — Other Ambulatory Visit: Payer: Self-pay

## 2022-12-22 ENCOUNTER — Encounter (HOSPITAL_COMMUNITY): Payer: Self-pay

## 2022-12-22 ENCOUNTER — Emergency Department (HOSPITAL_COMMUNITY)
Admission: EM | Admit: 2022-12-22 | Discharge: 2022-12-23 | Disposition: A | Discharge status: Home / Self Care | Payer: 59 | Attending: Emergency Medicine | Admitting: Emergency Medicine

## 2022-12-22 ENCOUNTER — Ambulatory Visit (HOSPITAL_BASED_OUTPATIENT_CLINIC_OR_DEPARTMENT_OTHER)
Admission: RE | Admit: 2022-12-22 | Discharge: 2022-12-22 | Disposition: A | Discharge status: Home / Self Care | Payer: 59 | Source: Ambulatory Visit | Attending: Family Medicine | Admitting: Family Medicine

## 2022-12-22 ENCOUNTER — Other Ambulatory Visit (HOSPITAL_COMMUNITY): Payer: Self-pay

## 2022-12-22 ENCOUNTER — Encounter (HOSPITAL_COMMUNITY): Payer: Self-pay | Admitting: Emergency Medicine

## 2022-12-22 VITALS — BP 96/72 | HR 69 | Wt 245.4 lb

## 2022-12-22 DIAGNOSIS — E785 Hyperlipidemia, unspecified: Secondary | ICD-10-CM | POA: Insufficient documentation

## 2022-12-22 DIAGNOSIS — Z5986 Financial insecurity: Secondary | ICD-10-CM | POA: Insufficient documentation

## 2022-12-22 DIAGNOSIS — Z794 Long term (current) use of insulin: Secondary | ICD-10-CM | POA: Insufficient documentation

## 2022-12-22 DIAGNOSIS — Z833 Family history of diabetes mellitus: Secondary | ICD-10-CM | POA: Insufficient documentation

## 2022-12-22 DIAGNOSIS — Z7984 Long term (current) use of oral hypoglycemic drugs: Secondary | ICD-10-CM | POA: Diagnosis not present

## 2022-12-22 DIAGNOSIS — I5042 Chronic combined systolic (congestive) and diastolic (congestive) heart failure: Secondary | ICD-10-CM | POA: Insufficient documentation

## 2022-12-22 DIAGNOSIS — I428 Other cardiomyopathies: Secondary | ICD-10-CM | POA: Insufficient documentation

## 2022-12-22 DIAGNOSIS — I2510 Atherosclerotic heart disease of native coronary artery without angina pectoris: Secondary | ICD-10-CM | POA: Insufficient documentation

## 2022-12-22 DIAGNOSIS — I4819 Other persistent atrial fibrillation: Secondary | ICD-10-CM | POA: Diagnosis not present

## 2022-12-22 DIAGNOSIS — Z79899 Other long term (current) drug therapy: Secondary | ICD-10-CM | POA: Insufficient documentation

## 2022-12-22 DIAGNOSIS — E1165 Type 2 diabetes mellitus with hyperglycemia: Secondary | ICD-10-CM | POA: Diagnosis present

## 2022-12-22 DIAGNOSIS — Z7901 Long term (current) use of anticoagulants: Secondary | ICD-10-CM | POA: Insufficient documentation

## 2022-12-22 DIAGNOSIS — I482 Chronic atrial fibrillation, unspecified: Secondary | ICD-10-CM | POA: Insufficient documentation

## 2022-12-22 DIAGNOSIS — E119 Type 2 diabetes mellitus without complications: Secondary | ICD-10-CM

## 2022-12-22 DIAGNOSIS — I5022 Chronic systolic (congestive) heart failure: Secondary | ICD-10-CM | POA: Diagnosis not present

## 2022-12-22 DIAGNOSIS — G4733 Obstructive sleep apnea (adult) (pediatric): Secondary | ICD-10-CM | POA: Insufficient documentation

## 2022-12-22 DIAGNOSIS — I480 Paroxysmal atrial fibrillation: Secondary | ICD-10-CM | POA: Insufficient documentation

## 2022-12-22 DIAGNOSIS — I5082 Biventricular heart failure: Secondary | ICD-10-CM | POA: Insufficient documentation

## 2022-12-22 DIAGNOSIS — Z8249 Family history of ischemic heart disease and other diseases of the circulatory system: Secondary | ICD-10-CM | POA: Insufficient documentation

## 2022-12-22 DIAGNOSIS — E1140 Type 2 diabetes mellitus with diabetic neuropathy, unspecified: Secondary | ICD-10-CM | POA: Insufficient documentation

## 2022-12-22 DIAGNOSIS — Z8551 Personal history of malignant neoplasm of bladder: Secondary | ICD-10-CM | POA: Insufficient documentation

## 2022-12-22 DIAGNOSIS — I509 Heart failure, unspecified: Secondary | ICD-10-CM | POA: Insufficient documentation

## 2022-12-22 DIAGNOSIS — C679 Malignant neoplasm of bladder, unspecified: Secondary | ICD-10-CM

## 2022-12-22 LAB — CBG MONITORING, ED
Glucose-Capillary: 104 mg/dL — ABNORMAL HIGH (ref 70–99)
Glucose-Capillary: 197 mg/dL — ABNORMAL HIGH (ref 70–99)
Glucose-Capillary: 333 mg/dL — ABNORMAL HIGH (ref 70–99)
Glucose-Capillary: 516 mg/dL (ref 70–99)
Glucose-Capillary: 591 mg/dL (ref 70–99)
Glucose-Capillary: 76 mg/dL (ref 70–99)
Glucose-Capillary: >600 mg/dL (ref 70–99)

## 2022-12-22 LAB — COMPREHENSIVE METABOLIC PANEL
ALT: 20 U/L (ref 0–44)
ALT: 21 U/L (ref 0–44)
AST: 18 U/L (ref 15–41)
AST: 21 U/L (ref 15–41)
Albumin: 3.5 g/dL (ref 3.5–5.0)
Albumin: 3.6 g/dL (ref 3.5–5.0)
Alkaline Phosphatase: 90 U/L (ref 38–126)
Alkaline Phosphatase: 92 U/L (ref 38–126)
Anion gap: 11 (ref 5–15)
Anion gap: 13 (ref 5–15)
BUN: 15 mg/dL (ref 8–23)
BUN: 17 mg/dL (ref 8–23)
CO2: 23 mmol/L (ref 22–32)
CO2: 20 mmol/L — ABNORMAL LOW (ref 22–32)
Calcium: 9 mg/dL (ref 8.9–10.3)
Calcium: 9.3 mg/dL (ref 8.9–10.3)
Chloride: 93 mmol/L — ABNORMAL LOW (ref 98–111)
Chloride: 91 mmol/L — ABNORMAL LOW (ref 98–111)
Creatinine, Ser: 1.82 mg/dL — ABNORMAL HIGH (ref 0.61–1.24)
Creatinine, Ser: 1.86 mg/dL — ABNORMAL HIGH (ref 0.61–1.24)
GFR, Estimated: 40 mL/min — ABNORMAL LOW (ref >60)
GFR, Estimated: 39 mL/min — ABNORMAL LOW (ref >60)
Glucose, Bld: 577 mg/dL (ref 70–99)
Glucose, Bld: 725 mg/dL (ref 70–99)
Potassium: 4.3 mmol/L (ref 3.5–5.1)
Potassium: 3.9 mmol/L (ref 3.5–5.1)
Sodium: 127 mmol/L — ABNORMAL LOW (ref 135–145)
Sodium: 124 mmol/L — ABNORMAL LOW (ref 135–145)
Total Bilirubin: 1.1 mg/dL (ref <1.2)
Total Bilirubin: 1.1 mg/dL (ref <1.2)
Total Protein: 7.1 g/dL (ref 6.5–8.1)
Total Protein: 7.1 g/dL (ref 6.5–8.1)

## 2022-12-22 LAB — URINALYSIS, ROUTINE W REFLEX MICROSCOPIC
Bacteria, UA: NONE SEEN
Bilirubin Urine: NEGATIVE
Glucose, UA: >=500 mg/dL — AB
Hgb urine dipstick: NEGATIVE
Ketones, ur: NEGATIVE mg/dL
Leukocytes,Ua: NEGATIVE
Nitrite: NEGATIVE
Protein, ur: NEGATIVE mg/dL
Specific Gravity, Urine: 1.026 (ref 1.005–1.030)
pH: 5 (ref 5.0–8.0)

## 2022-12-22 LAB — BRAIN NATRIURETIC PEPTIDE: B Natriuretic Peptide: 53.3 pg/mL (ref 0.0–100.0)

## 2022-12-22 LAB — I-STAT VENOUS BLOOD GAS, ED
Acid-Base Excess: 0 mmol/L (ref 0.0–2.0)
Bicarbonate: 25.6 mmol/L (ref 20.0–28.0)
Calcium, Ion: 1.12 mmol/L — ABNORMAL LOW (ref 1.15–1.40)
HCT: 49 % (ref 39.0–52.0)
Hemoglobin: 16.7 g/dL (ref 13.0–17.0)
O2 Saturation: 31 %
Potassium: 3.9 mmol/L (ref 3.5–5.1)
Sodium: 126 mmol/L — ABNORMAL LOW (ref 135–145)
TCO2: 27 mmol/L (ref 22–32)
pCO2, Ven: 43.9 mm[Hg] — ABNORMAL LOW (ref 44–60)
pH, Ven: 7.374 (ref 7.25–7.43)
pO2, Ven: 20 mm[Hg] — CL (ref 32–45)

## 2022-12-22 LAB — LIPID PANEL
Cholesterol: 198 mg/dL (ref 0–200)
HDL: 47 mg/dL (ref >40)
LDL Cholesterol: 118 mg/dL — ABNORMAL HIGH (ref 0–99)
Total CHOL/HDL Ratio: 4.2 {ratio}
Triglycerides: 167 mg/dL — ABNORMAL HIGH (ref <150)
VLDL: 33 mg/dL (ref 0–40)

## 2022-12-22 LAB — TSH: TSH: 1.013 u[IU]/mL (ref 0.350–4.500)

## 2022-12-22 LAB — BETA-HYDROXYBUTYRIC ACID: Beta-Hydroxybutyric Acid: 0.14 mmol/L (ref 0.05–0.27)

## 2022-12-22 LAB — MAGNESIUM: Magnesium: 1.9 mg/dL (ref 1.7–2.4)

## 2022-12-22 LAB — OSMOLALITY: Osmolality: 310 mosm/kg — ABNORMAL HIGH (ref 275–295)

## 2022-12-22 MED ORDER — METFORMIN HCL 1000 MG PO TABS
1000.0000 mg | ORAL_TABLET | Freq: Two times a day (BID) | ORAL | 1 refills | Status: DC
  Filled 2022-12-24: qty 60, 30d supply, fill #0
  Filled 2023-02-10 – 2023-02-12 (×3): qty 60, 30d supply, fill #1

## 2022-12-22 MED ORDER — ATORVASTATIN CALCIUM 80 MG PO TABS
80.0000 mg | ORAL_TABLET | Freq: Every day | ORAL | 11 refills | Status: DC
  Filled 2022-12-22: qty 30, 30d supply, fill #0
  Filled 2023-02-10 – 2023-02-12 (×3): qty 30, 30d supply, fill #1
  Filled 2023-03-10 – 2023-03-26 (×2): qty 30, 30d supply, fill #2
  Filled 2023-04-13 – 2023-04-21 (×2): qty 30, 30d supply, fill #3

## 2022-12-22 MED ORDER — DEXTROSE 50 % IV SOLN: 0.0000 mL | INTRAVENOUS | Status: DC | PRN

## 2022-12-22 MED ORDER — AMIODARONE HCL 200 MG PO TABS
ORAL_TABLET | ORAL | 0 refills | Status: DC
  Filled 2022-12-22: qty 44, 30d supply, fill #0

## 2022-12-22 MED ORDER — APIXABAN 5 MG PO TABS: ORAL_TABLET | Freq: Two times a day (BID) | ORAL | Status: DC

## 2022-12-22 MED ORDER — INSULIN REGULAR(HUMAN) IN NACL 100-0.9 UT/100ML-% IV SOLN
INTRAVENOUS | Status: DC
  Administered 2022-12-22: 13 [IU]/h via INTRAVENOUS
  Filled 2022-12-22: qty 100

## 2022-12-22 MED ORDER — EMPAGLIFLOZIN 10 MG PO TABS
10.0000 mg | ORAL_TABLET | Freq: Every day | ORAL | 3 refills | Status: DC
  Filled 2022-12-24: qty 90, 90d supply, fill #0

## 2022-12-22 MED ORDER — LIDOCAINE HCL (PF) 1 % IJ SOLN: 30.0000 mL | Freq: Once | INTRAMUSCULAR | Status: DC

## 2022-12-22 MED ORDER — LACTATED RINGERS IV SOLN: INTRAVENOUS | Status: DC

## 2022-12-22 MED ORDER — INSULIN ASPART 100 UNIT/ML IJ SOLN
10.0000 [IU] | Freq: Once | INTRAMUSCULAR | Status: AC
  Administered 2022-12-22: 10 [IU] via SUBCUTANEOUS

## 2022-12-22 MED ORDER — SODIUM CHLORIDE 0.9 % IV BOLUS
1000.0000 mL | Freq: Once | INTRAVENOUS | Status: AC
  Administered 2022-12-22: 1000 mL via INTRAVENOUS

## 2022-12-22 MED ORDER — SODIUM CHLORIDE 0.9% FLUSH: 10.0000 mL | Freq: Two times a day (BID) | INTRAVENOUS | Status: DC

## 2022-12-22 NOTE — ED Triage Notes (Signed)
Pt here POV for high blood sugar, states that he was told to come in by PCP. Hx of DM, states that he is supposed to be taking pills and has not been taking them. Denies symptoms, states he had went to PCP for regular check up.

## 2022-12-22 NOTE — ED Provider Notes (Signed)
[place] EMERGENCY DEPARTMENT AT [facility] Provider Note   CSN: [id number] Arrival date & time: 12/22/22  1504     History  Chief Complaint  Patient presents with   Hyperglycemia    Darius [name] is a 67 y.o. male with history of type 2 diabetes, paroxysmal A-fib, prior left atrial appendage thrombus on Eliquis, Solik CHF, nonischemic cardiomyopathy with an EF of 15% previously but on most recent echo recovered to 50-55%, bladder cancer that has been treated who presents to the ED from cardiology office for hyperglycemia.  Blood sugar was found to be undetectably high.  Patient was sent to ED for further evaluation.  He states that he has not been taking any of his diabetes medicines or any other medicines for the past year after feeling poorly following a vaccine.  He denies previously being prescribed insulin for diabetes.  He is unsure what medications he is supposed to be on.  He denies fevers, chest pain, cough, dysuria, abdominal pain, or any other sick symptoms.  He denies shortness of breath, jaw or arm pain, nausea.  He denies polydipsia or polyuria.  Dates that if it were not for being sent here from the clinic he would not be here today in the ED.   Hyperglycemia      Home Medications Prior to Admission medications   Medication Sig Start Date End Date Taking? Authorizing Provider  amiodarone (PACERONE) 200 MG tablet Take 1 tablet (200 mg total) by mouth 2 (two) times daily for 14 days, THEN 1 tablet (200 mg total) daily. 12/22/22 01/05/24  Jacklynn Ganong, FNP  apixaban (ELIQUIS) 5 MG TABS tablet TAKE 1 TABLET (5 MG TOTAL) BY MOUTH 2 (TWO) TIMES DAILY. 12/22/22 12/22/23  Jacklynn Ganong, FNP  atorvastatin (LIPITOR) 80 MG tablet Take 1 tablet (80 mg total) by mouth at bedtime. 12/22/22   [place], Anderson Malta, FNP  empagliflozin (JARDIANCE) 10 MG TABS tablet Take 1 tablet (10 mg total) by mouth daily before breakfast. 12/22/22   Karmen Stabs, MD  ezetimibe  (ZETIA) 10 MG tablet Take 1 tablet (10 mg total) by mouth daily. Patient not taking: Reported on 12/22/2022 10/24/20   Robbie Lis M, PA-C  metFORMIN (GLUCOPHAGE) 1000 MG tablet Take 1 tablet (1,000 mg total) by mouth 2 (two) times daily with food. 12/22/22 02/20/23  Karmen Stabs, MD  metoprolol (TOPROL-XL) 200 MG 24 hr tablet Take 1 tablet (200 mg total) by mouth daily. Take with or immediately following a meal. Patient not taking: Reported on 12/22/2022 12/11/20   Regan Lemming, MD  oxybutynin (DITROPAN) 5 MG tablet Take 1 tablet (5 mg total) by mouth 2 (two) times daily as needed for bladder spasms. Patient not taking: Reported on 12/22/2022 02/24/22   Jerilee Field, MD  oxyCODONE (OXY IR/ROXICODONE) 5 MG immediate release tablet Take 1 tablet  by mouth every 4 hours as needed for moderate pain. Patient not taking: Reported on 12/22/2022 12/15/20   Malen Gauze, MD  prochlorperazine (COMPAZINE) 10 MG tablet Take 1 tablet (10 mg total) by mouth every 6 (six) hours as needed for nausea or vomiting. Patient not taking: Reported on 12/22/2022 01/17/21   Benjiman Core, MD  sacubitril-valsartan (ENTRESTO) 97-103 MG Take 1 tablet by mouth 2 (two) times daily. Patient not taking: Reported on 12/22/2022 02/24/22   Laurey Morale, MD  spironolactone (ALDACTONE) 25 MG tablet Take 1 tablet (25 mg total) by mouth daily. Patient not taking: Reported on 12/22/2022 06/24/22  Laurey Morale, MD  tamsulosin (FLOMAX) 0.4 MG CAPS capsule Take 1 capsule (0.4 mg total) by mouth daily after supper. Patient not taking: Reported on 12/22/2022 02/24/22   Jerilee Field, MD      Allergies    Patient has no known allergies.    Review of Systems   Review of Systems per HPI above  Physical Exam Updated Vital Signs BP (!) 118/51   Pulse (!) 106   Temp 98 F (36.7 C)   Resp 18   Ht 6\' 2"  (1.88 m)   Wt 111.3 kg   SpO2 100%   BMI 31.51 kg/m  Physical Exam Constitutional:       Appearance: Normal appearance. He is obese.  HENT:     Head: Normocephalic and atraumatic.     Nose: Nose normal.     Mouth/Throat:     Mouth: Mucous membranes are dry.  Eyes:     Extraocular Movements: Extraocular movements intact.     Pupils: Pupils are equal, round, and reactive to light.  Cardiovascular:     Rate and Rhythm: Regular rhythm. Tachycardia present.     Pulses: Normal pulses.     Heart sounds: Normal heart sounds.  Pulmonary:     Effort: Pulmonary effort is normal.     Breath sounds: Normal breath sounds.  Abdominal:     General: There is no distension.     Palpations: Abdomen is soft.     Tenderness: There is no abdominal tenderness. There is no right CVA tenderness, left CVA tenderness or guarding.  Musculoskeletal:        General: No tenderness or signs of injury.     Cervical back: Neck supple.     Right lower leg: No edema.     Left lower leg: No edema.  Skin:    General: Skin is warm and dry.     Findings: No lesion or rash.  Neurological:     General: No focal deficit present.     Mental Status: He is alert. Mental status is at baseline.     Cranial Nerves: No cranial nerve deficit.     Sensory: No sensory deficit.     Motor: No weakness.     ED Results / Procedures / Treatments   Labs (all labs ordered are listed, but only abnormal results are displayed) Labs Reviewed  URINALYSIS, ROUTINE W REFLEX MICROSCOPIC - Abnormal; Notable for the following components:      Result Value   Color, Urine STRAW (*)    Glucose, UA >=500 (*)    All other components within normal limits  COMPREHENSIVE METABOLIC PANEL - Abnormal; Notable for the following components:   Sodium 124 (*)    Chloride 91 (*)    CO2 20 (*)    Glucose, Bld 725 (*)    Creatinine, Ser 1.86 (*)    GFR, Estimated 39 (*)    All other components within normal limits  OSMOLALITY - Abnormal; Notable for the following components:   Osmolality 310 (*)    All other components within normal  limits  CBG MONITORING, ED - Abnormal; Notable for the following components:   Glucose-Capillary >600 (*)    All other components within normal limits  CBG MONITORING, ED - Abnormal; Notable for the following components:   Glucose-Capillary 591 (*)    All other components within normal limits  I-STAT VENOUS BLOOD GAS, ED - Abnormal; Notable for the following components:   pCO2, Ven 43.9 (*)  pO2, Ven 20 (*)    Sodium 126 (*)    Calcium, Ion 1.12 (*)    All other components within normal limits  CBG MONITORING, ED - Abnormal; Notable for the following components:   Glucose-Capillary 516 (*)    All other components within normal limits  CBG MONITORING, ED - Abnormal; Notable for the following components:   Glucose-Capillary 333 (*)    All other components within normal limits  CBG MONITORING, ED - Abnormal; Notable for the following components:   Glucose-Capillary 104 (*)    All other components within normal limits  BETA-HYDROXYBUTYRIC ACID    EKG None  Radiology No results found.  Procedures Procedures    Medications Ordered in ED Medications  insulin regular, human (MYXREDLIN) 100 units/ 100 mL infusion (13 Units/hr Intravenous New Bag/Given 12/22/22 2027)  lactated ringers infusion ( Intravenous New Bag/Given 12/22/22 2023)  dextrose 50 % solution 0-50 mL (has no administration in time range)  sodium chloride flush (NS) 0.9 % injection 10 mL (has no administration in time range)  sodium chloride 0.9 % bolus 1,000 mL (0 mLs Intravenous Stopped 12/22/22 2116)  insulin aspart (novoLOG) injection 10 Units (10 Units Subcutaneous Given 12/22/22 1938)  sodium chloride 0.9 % bolus 1,000 mL (0 mLs Intravenous Stopped 12/22/22 2116)    ED Course/ Medical Decision Making/ A&P                                 Medical Decision Making Amount and/or Complexity of Data Reviewed Labs: ordered. Decision-making details documented in ED Course.  Risk Prescription drug  management.   67 year old gentleman with history of diabetes who has been off of his antidiabetics for almost a year, who presents for hyperglycemia found at a clinic visit and otherwise no other symptoms.  Differential considered includes primary diabetes with hyperglycemia, HHS, DKA, per glycemia secondary to acute infection ACS or other stressor.  His physical exam shows normal stable vitals and no fever not consistent with sepsis.  He has no focal signs of illness or infection or ACS on exam or based on symptoms, I do not suspect this is stress related hyperglycemia from infection or ACS; but will obtain EKG to screen for cardiac ischemia.  Initial glucose here is greater than 600 confirming he is very hyperglycemic.  He appears mildly dry on exam.  Will obtain labs including CBC, CMP, serum osmole's, blood gas, beta hydroxy, UA with ketones, lactate to further evaluate for benign hyperglycemia versus DKA versus HHS. Will start fluid resuscitation with NS given appears dry.  Will give 10 units subcutaneous insulin and start insulin infusion.   Labs are without evidence of DKA not meet criteria for HHS.  Blood sugar is 725.  Renal Function is near prior.  LFTs are unremarkable.   Patient was monitored for several hours on insulin infusion.  When glucose was 333 he was stable for discharge.  Repeat glucose checked immediately after turning off insulin infusion was 104 slightly below goal; however patient denied any dizziness, nausea, vision changes, or other symptoms.  I refilled his antidiabetic medications and encouraged him to follow-up with his PCP.  He is agreeable to this plan.  Strict return precautions were discussed.  He was discharged from the ED in stable condition, ambulatory without deficit          Final Clinical Impression(s) / ED Diagnoses Final diagnoses:  Type 2 diabetes mellitus without complication,  without long-term current use of insulin (HCC)    Rx / DC Orders ED  Discharge Orders          Ordered    metFORMIN (GLUCOPHAGE) 1000 MG tablet  2 times daily with meals        12/22/22 2227    empagliflozin (JARDIANCE) 10 MG TABS tablet  Daily before breakfast        12/22/22 2227              Karmen Stabs, MD 12/22/22 2235    Lorre Nick, MD 12/23/22 [phone]

## 2022-12-22 NOTE — Discharge Instructions (Signed)
You were seen today for elevated blood sugar.  While you were here we checked some labs which did not show any signs of life-threatening complication from high blood sugar.  We lowered your blood sugar with an insulin infusion.  I refilled your diabetes medications for you.  Please pick these up and take them as prescribed and most importantly, make an appointment to follow-up with your primary care doctor.   Turn to the emergency department if you develop vision changes, increased thirst or urination, dizziness, somnolence, chest pain or shortness of breath, or any new or concerning symptoms

## 2022-12-22 NOTE — Progress Notes (Signed)
Advanced Heart Failure Clinic Note   PCP: Barbette Merino, NP  HF Cardiologist:  Dr. [name]  HPI:  Darius [name] is a 67 y.o. 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   Admitted 04/17/16-04/24/16 for acute systolic CHF and atrial fibrillation with RVR. Had a R/L heart cath on 04/21/16, no CAD, right heart pressures elevated, low output. Echo showed EF 15%. Plan was to undergo TEE/DC-CV, however he was found to have a left atrial appendage thrombus and had only started Eliquis 5 days prior. He was discharged with plans for TEE/DC-CV in about a month. He was started on Entresto 24/26 mg BID during admission. Discharged home on Lasix 40 mg daily, digoxin 0.125mg , and metoprolol XL 50 mg BID. Discharge 280 pounds.   Repeat TEE done in 4/18 for possible cardioversion.  EF remained 25-30%.  He still had a small, amorphous LA appendage thrombus (smaller than prior but still present).  He admitted to having missed some Eliquis doses.    He finally had TEE-guided DCCV in 7/18 with conversion to NSR.  TEE showed EF 40-45%.  After this, he went back into atrial fibrilation.  Given poor compliance with followup and recurrent atrial fibrillation, it was decided to leave him in atrial fibrillation as rate was well-controlled.    Echo in 12/21 showed that EF remains 55% with mild LVH, mildly decreased RV systolic function, IVC normal.   Bladder cancer was diagnosed in 11/22 and patient has undergone treatment.   Echo 11/23 showed EF 50-55%  Today he returns for HF follow up. We have not seen him in over a year. Overall feeling fine. Not very active, but no SOB push mowing yard for 15 minutes at a time.  No SOB walking on flat ground or with ADLs. He has been off all GDMT x 1 year, says he feels fine off his meds. Denies palpitations, abnormal bleeding, CP, dizziness, edema, or PND/Orthopnea.  Appetite ok. No fever or chills. Weight at home 241 pounds. He is unsure if he snores. Rare ETOH, no tobacco or drugs.  ECG (personally reviewed): atrial fibrillation with RVR, 1 PVC  REDs: 25%  Labs (1/19): K 4.6, creatinine 0.97, LFTs normal, BNP 129 Labs (9/21): LDL 65, HDl 53, K 4.9, creatinine 7.56 Labs (3/22): K 4.2, creatinine 1.38 Labs (4/23): K 4.2, creatinine 1.92 Labs (11/23): K 4.6, creatinine 1.57  Cardiac Studies  - Echo 11/23: EF 50-55% - Echo 12/21: EF remains 55% with mild LVH, mildly decreased RV systolic function, IVC normal.  - Echo 8/19: EF 55-60% - TEE 7/18 EF 40-45% No thrombus. Successful DC-CV - TEE: 4/18 with EF 25-30%, diffuse hypokinesis, RV mildly dilated with moderately decreased systolic function, amorphous thrombus LA appendage (improved compared to 3/18).  - RHC/LHC 3/18: no significant CAD; RA 14, PA 50/28 (36), PCWP 22, CO/CI (Fick) 4.85/1.89, PVR 2.9 WU - TEE: 3/18 with LAA thrombus.  - Echo 3/18 EF 15% Peak PA pressure 38 mm hg   Review of systems complete and found to be negative unless listed in HPI.   SH:  Social History   Socioeconomic History   Marital status: Single    Spouse name: Not on file   Number of children: 0   Years of education: 12   Highest education level: Not on file  Occupational History   Occupation: unemployeed  Tobacco Use   Smoking status: Never  Smokeless tobacco: Never  Vaping Use   Vaping status: Never Used  Substance and Sexual Activity   Alcohol use: Yes    Comment: some   Drug use: Not Currently   Sexual activity: Not on file  Other Topics Concern   Not on file  Social History Narrative   Admits that he eats poorly and eats a lot at work.      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   Social Determinants of Health   Financial Resource Strain: High Risk (07/30/2020)   Overall Financial Resource Strain (CARDIA)    Difficulty of Paying Living Expenses: Hard  Food Insecurity:  Food Insecurity Present (07/30/2020)   Hunger Vital Sign    Worried About Running Out of Food in the Last Year: Sometimes true    Ran Out of Food in the Last Year: Sometimes true  Transportation Needs: No Transportation Needs (07/30/2020)   PRAPARE - Administrator, Civil Service (Medical): No    Lack of Transportation (Non-Medical): No  Physical Activity: Inactive (07/30/2020)   Exercise Vital Sign    Days of Exercise per Week: 0 days    Minutes of Exercise per Session: 0 min  Stress: Stress Concern Present (07/30/2020)   [employer] of Occupational Health - Occupational Stress Questionnaire    Feeling of Stress : To some extent  Social Connections: Socially Isolated (07/30/2020)   Social Connection and Isolation Panel [NHANES]    Frequency of Communication with Friends and Family: More than three times a week    Frequency of Social Gatherings with Friends and Family: Never    Attends Religious Services: Never    Database administrator or Organizations: No    Attends Banker Meetings: Never    Marital Status: Never married  Intimate Partner Violence: Not At Risk (07/30/2020)   Humiliation, Afraid, Rape, and Kick questionnaire    Fear of Current or Ex-Partner: No    Emotionally Abused: No    Physically Abused: No    Sexually Abused: No   FH:  Family History  Problem Relation Age of Onset   Diabetes Father        died in his 18s   Hypertension Father    Seizures Mother        died in her 74s   Hypertension Sister    Hypertension Paternal Grandfather    Heart attack Neg Hx    Stroke Neg Hx    Past Medical History:  Diagnosis Date   Chronic combined systolic and diastolic heart failure (HCC)    followed by cardiologist;  a. Echo 03/27/11: EF 50-55%, inferior hypokinesis, moderate LAE, mild RVE, normal pulmonary pressures. ;   b.  TEE (04/03/11): EF 40-45%, mild MR moderate LAE, no LAA clot, mild RAE;  c.  Echocardiogram (02/2013): EF 60-65%, Gr 2 DD,  mildly dilated Ao root (Ao root dimension 39 mm), MAC, mild LAE, normal RVF   Complication of anesthesia    pt unable to tolerate versed and fentanyl sedation and required general anesthesia with TEE 02/ 2013   Depression    Diabetes mellitus, type 2 (HCC)    followed by pcp  (02-21-2021 pt stated does not check blood sugar )   Hx of cardiovascular stress test    Lexiscan Myoview (02/2013): No ischemia or scar, EF 51%, low risk;  cardiac cath 04-21-2016  no CAD, NICM   Malignant neoplasm of urinary bladder [facility]) 12/2020   urologist--- dr  eskridge;  oncologist-- dr Clelia Croft,  high grade muscle invasion   Morbid obesity (HCC)    NICM (nonischemic cardiomyopathy) (HCC) 04/2016   followed by cardiology;   dx 03/ 2018 per TEE ef 15%;  TEE 04/ 2018 ef 25%;  TEE 07/ 2018 ef 40%;  echo 12/ 2021 ef 55%   OSA (obstructive sleep apnea)    followed by dr t. turner--  no cpap/ bipap:: previously followed  by dr clance sleep study 02-11-2013  very severe osa w/ AHI 108/hr had used cpap until 2017:   repeat sleep study 02-14-2018 in epic severe osa w/ hypoxemia, AHI 46.3/hr titrated 04-15-2018 used cpap for awhile   Persistent atrial fibrillation (HCC) 03/2011   cardiologist--   s/p TEE-DCCV (failed);  Amiodarone started   Current Outpatient Medications  Medication Sig Dispense Refill   apixaban (ELIQUIS) 5 MG TABS tablet TAKE 1 TABLET (5 MG TOTAL) BY MOUTH 2 (TWO) TIMES DAILY. (Patient not taking: Reported on 12/22/2022) 60 tablet 0   atorvastatin (LIPITOR) 80 MG tablet TAKE 1 TABLET BY MOUTH AT BEDTIME. (Patient not taking: Reported on 12/22/2022) 30 tablet 6   empagliflozin (JARDIANCE) 10 MG TABS tablet Take 1 tablet (10 mg total) by mouth daily before breakfast. (Patient not taking: Reported on 12/22/2022) 90 tablet 3   ezetimibe (ZETIA) 10 MG tablet Take 1 tablet (10 mg total) by mouth daily. (Patient not taking: Reported on 12/22/2022) 90 tablet 3   metFORMIN (GLUCOPHAGE) 1000 MG tablet Take 1 tablet  (1,000 mg total) by mouth 2 (two) times daily with food. (Patient not taking: Reported on 12/22/2022) 60 tablet 1   metoprolol (TOPROL-XL) 200 MG 24 hr tablet Take 1 tablet (200 mg total) by mouth daily. Take with or immediately following a meal. (Patient not taking: Reported on 12/22/2022) 90 tablet 1   oxybutynin (DITROPAN) 5 MG tablet Take 1 tablet (5 mg total) by mouth 2 (two) times daily as needed for bladder spasms. (Patient not taking: Reported on 12/22/2022) 30 tablet 3   oxyCODONE (OXY IR/ROXICODONE) 5 MG immediate release tablet Take 1 tablet  by mouth every 4 hours as needed for moderate pain. (Patient not taking: Reported on 12/22/2022) 30 tablet 0   prochlorperazine (COMPAZINE) 10 MG tablet Take 1 tablet (10 mg total) by mouth every 6 (six) hours as needed for nausea or vomiting. (Patient not taking: Reported on 12/22/2022) 30 tablet 0   sacubitril-valsartan (ENTRESTO) 97-103 MG Take 1 tablet by mouth 2 (two) times daily. (Patient not taking: Reported on 12/22/2022) 60 tablet 3   spironolactone (ALDACTONE) 25 MG tablet Take 1 tablet (25 mg total) by mouth daily. (Patient not taking: Reported on 12/22/2022) 90 tablet 0   tamsulosin (FLOMAX) 0.4 MG CAPS capsule Take 1 capsule (0.4 mg total) by mouth daily after supper. (Patient not taking: Reported on 12/22/2022) 90 capsule 3   No current facility-administered medications for this encounter.   BP 96/72   Pulse 69   Wt 111.3 kg (245 lb 6.4 oz)   SpO2 97%   BMI 31.51 kg/m   Wt Readings from Last 3 Encounters:  12/22/22 111.3 kg (245 lb 6.4 oz)  06/03/22 113.9 kg (251 lb 3.2 oz)  03/28/22 120.7 kg (266 lb 3.2 oz)   PHYSICAL EXAM: General:  NAD. No resp difficulty, walked into clinic HEENT: Normal Neck: Supple. No JVD. Carotids 2+ bilat; no bruits. No lymphadenopathy or thryomegaly appreciated. Cor: PMI nondisplaced. Tachy irregular & rhythm. No rubs, gallops or murmurs. Lungs: Clear Abdomen:  Soft, nontender, nondistended. No  hepatosplenomegaly. No bruits or masses. Good bowel sounds. Extremities: No cyanosis, clubbing, rash, edema Neuro: Alert & oriented x 3, cranial nerves grossly intact. Moves all 4 extremities w/o difficulty. Affect pleasant.  ASSESSMENT & PLAN: 1. Chronic systolic => diastolic CHF: Had [facility] 04/2016 showing minimal CAD.  Nonischemic cardiomyopathy, possibly tachycardia-mediated in the setting of rapid atrial fibrillation.   Echo 04/2016 EF 15% but improved to 40-45% on TEE in 7/18 and 55-60% by 8/19 echo.  Echo in 12/21 showed that EF remains 55% despite being back in atrial fibrillation.  Echo 11/23 showed EF 50-55%. He is not volume overloaded on exam, ReDs 25%. NYHA class II symptoms. He has been off all meds x 1 year. - BP too low today to restart GDMT. BMET/BNP today - Needs updated echo, arrange at next visit. 2. Atrial fibrillation: Chronic atrial fibrillation.  Was thought to have tachy-mediated CMP in past, but EF 55% on 12/21 echo in setting of good rate control.  Previously decided not to cardiovert him again as he broke through amiodarone and was not particularly compliant. He remains in atrial fibrillation today, with RVR. He is asymptomatic. - Check CMET, TSH, Mag today - Restart Eliquis 5 mg bid. We discussed stroke risk. - Will attempt re-trial of amiodarone for rate control; start amio 200 mg bid x 2 weeks, then 200 mg daily. Will need to follow LFTs and TSH. He will need regular eye exam while on amiodarone. - I considered adding digoxin for rate control, but health literacy appears low and he has history of poor follow up. Will defer for now. - Refer back to EP (he has seen Dr Elberta Fortis in the past) for AAD (? Tikosyn). Discussed with Dr. [name] today. 3. HLD: LDL 112 (11/23). Restart atorvastatin 80 mg daily, check lipids & LFTs in 8 weeks. 4. OSA: Needs appointment with sleep medicine to get back on CPAP, looks like he was unable to complete his split night sleep study. Refer back to  Dr. Mayford Knife.  5. Diabetes: Per primary, has neuropathy.  6. Bladder cancer: s/p treatment. Denies hematuria.  Follow up in 3 weeks with APP (attempt to add back GDMT, arrange echo).  Anderson Malta [facility] FNP-BC 12/22/2022

## 2022-12-22 NOTE — Progress Notes (Signed)
ReDS Vest / Clip - 12/22/22 1000       ReDS Vest / Clip   Station Marker D    Ruler Value 37    ReDS Value Range Low volume    ReDS Actual Value 25

## 2022-12-22 NOTE — Patient Instructions (Signed)
Labs done today. We will contact you only if your labs are abnormal.  RESTART Eliquis 5mg  (1 tablet) by mouth 2 times daily.   RESTART Atorvastatin 80mg  (1 tablet) by mouth daily.   START Amiodarone 200mg  (1 tablet) by mouth 2times daily for 2 weeks THEN decrease to 200mg  (1 tablet) by mouth daily.   No other medication changes were made. Please continue all current medications as prescribed.  Your physician recommends that you schedule a follow-up appointment in: 3 weeks with our NP/PA Clinic here in our office and in 8 weeks with our  NP/PA Clinic here in our office and in 8 weeks for a lab only appointment.   You have been referred to Electrophyiscology and to Dr. Mayford Knife. Both offices will contact you to schedule an appointment.   If you have any questions or concerns before your next appointment please send [place] a message through [place] or call our office at [phone].    TO LEAVE A MESSAGE FOR THE NURSE SELECT OPTION 2, PLEASE LEAVE A MESSAGE INCLUDING: YOUR NAME DATE OF BIRTH CALL BACK NUMBER REASON FOR CALL**this is important as we prioritize the call backs  YOU WILL RECEIVE A CALL BACK THE SAME DAY AS LONG AS YOU CALL BEFORE 4:00 PM   Do the following things EVERYDAY: Weigh yourself in the morning before breakfast. Write it down and keep it in a log. Take your medicines as prescribed Eat low salt foods--Limit salt (sodium) to 2000 mg per day.  Stay as active as you can everyday Limit all fluids for the day to less than 2 liters   At the [facility], you and your health needs are our priority. As part of our continuing mission to provide you with exceptional heart care, we have created designated Provider Care Teams. These Care Teams include your primary Cardiologist (physician) and Advanced Practice Providers (APPs- Physician Assistants and Nurse Practitioners) who all work together to provide you with the care you need, when you need it.   You may see  any of the following providers on your designated Care Team at your next follow up: Dr Arvilla Meres Dr Marca Ancona Dr. Marcos Eke, NP Robbie Lis, [place] [facility] [address] [place], [place] Brynda Peon, NP Karle Plumber, PharmD   Please be sure to bring in all your medications bottles to every appointment.    Thank you for choosing [facility]

## 2022-12-22 NOTE — ED Provider Notes (Addendum)
I saw and evaluated the patient, reviewed the resident's note and I agree with the findings and plan.      Patient presents due to hyperglycemia that was found on routine PCP follow-up.  Patient does endorse polyuria and polydipsia here.  Patient has no evidence of DKA at this time.  Will treat with IV hydration as well as insulin and reassess.  10:26 PM Patient given IV fluids here along with insulin drip.  Blood sugar gradually improved.  He is at his baseline will be discharged  CRITICAL CARE Performed by: Toy Baker Total critical care time: 50 minutes Critical care time was exclusive of separately billable procedures and treating other patients. Critical care was necessary to treat or prevent imminent or life-threatening deterioration. Critical care was time spent personally by me on the following activities: development of treatment plan with patient and/or surrogate as well as nursing, discussions with consultants, evaluation of patient's response to treatment, examination of patient, obtaining history from patient or surrogate, ordering and performing treatments and interventions, ordering and review of laboratory studies, ordering and review of radiographic studies, pulse oximetry and re-evaluation of patient's condition.    Lorre Nick, MD 12/22/22 Willaim Sheng    Lorre Nick, MD 12/22/22 2227

## 2022-12-23 LAB — CBG MONITORING, ED: Glucose-Capillary: 154 mg/dL — ABNORMAL HIGH (ref 70–99)

## 2022-12-24 ENCOUNTER — Other Ambulatory Visit (HOSPITAL_COMMUNITY): Payer: Self-pay

## 2022-12-25 ENCOUNTER — Other Ambulatory Visit: Payer: Self-pay

## 2022-12-25 ENCOUNTER — Other Ambulatory Visit (HOSPITAL_COMMUNITY): Payer: Self-pay

## 2022-12-25 ENCOUNTER — Other Ambulatory Visit (HOSPITAL_COMMUNITY): Payer: Self-pay | Admitting: Cardiology

## 2022-12-25 MED ORDER — SPIRONOLACTONE 25 MG PO TABS
25.0000 mg | ORAL_TABLET | Freq: Every day | ORAL | 0 refills | Status: DC
  Filled 2022-12-25: qty 90, 90d supply, fill #0

## 2023-01-06 ENCOUNTER — Other Ambulatory Visit (HOSPITAL_COMMUNITY): Payer: Self-pay

## 2023-01-12 ENCOUNTER — Ambulatory Visit: Payer: 59 | Admitting: Urology

## 2023-01-13 ENCOUNTER — Ambulatory Visit (INDEPENDENT_AMBULATORY_CARE_PROVIDER_SITE_OTHER): Payer: 59 | Admitting: Family Medicine

## 2023-01-13 ENCOUNTER — Encounter: Payer: Self-pay | Admitting: Family Medicine

## 2023-01-13 VITALS — BP 109/72 | HR 81 | Ht 74.0 in | Wt 238.4 lb

## 2023-01-13 DIAGNOSIS — Z7984 Long term (current) use of oral hypoglycemic drugs: Secondary | ICD-10-CM

## 2023-01-13 DIAGNOSIS — N1832 Chronic kidney disease, stage 3b: Secondary | ICD-10-CM

## 2023-01-13 DIAGNOSIS — R972 Elevated prostate specific antigen [PSA]: Secondary | ICD-10-CM | POA: Diagnosis not present

## 2023-01-13 DIAGNOSIS — E1100 Type 2 diabetes mellitus with hyperosmolarity without nonketotic hyperglycemic-hyperosmolar coma (NKHHC): Secondary | ICD-10-CM | POA: Diagnosis not present

## 2023-01-13 DIAGNOSIS — Z7689 Persons encountering health services in other specified circumstances: Secondary | ICD-10-CM

## 2023-01-13 DIAGNOSIS — E66813 Obesity, class 3: Secondary | ICD-10-CM | POA: Diagnosis not present

## 2023-01-13 NOTE — Progress Notes (Unsigned)
New Patient Office Visit  Subjective    Patient ID: Darius [name], male    DOB: Jun 07, 1955  Age: 67 y.o. MRN: [id number]  CC: No chief complaint on file.   HPI Darius [name] presents to establish care.  Patient states he did not have a PCP prior to this.  He has a cardiologist and a urologist.  As well as an ophthalmologist at [facility].  DM2-patient has not recently been on diabetes medications.  Went to the emergency department for hyperglycemia on routine blood work.  Is now taking metformin and Jardiance.  Patient sees urology for bladder cancer.  Has had a TURBT in the past.  Missed his appointment yesterday.  Forgot he had 1.  Patient sees cardiology for heart failure and A-fib.  Next appointment is in 12/27.  PMH: DM2, A-fib, HFrEF, bladder cancer (possible other cancer? Dr. Mena Goes), CKD 3, OSA (was on cpap, could not tolerate).   Pill pack.    PSH: tonsillectomy, bladder cancer removal? Via cystoscopy.  FH: Father- dm, hypertension, PGF-hypertension, Mother- breast cancer  Tobacco use: none Alcohol use: socially Drug use: none Marital status: none.  Lives alone.  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   apixaban (ELIQUIS) 5 MG TABS tablet TAKE 1 TABLET (5 MG TOTAL) BY MOUTH 2 (TWO) TIMES DAILY.   atorvastatin (LIPITOR) 80 MG tablet Take 1 tablet (80 mg total) by mouth at bedtime.   empagliflozin (JARDIANCE) 10 MG TABS tablet Take 1 tablet (10 mg total) by mouth daily before breakfast.   ezetimibe (ZETIA) 10 MG tablet Take 1  tablet (10 mg total) by mouth daily. (Patient not taking: Reported on 12/22/2022)   metFORMIN (GLUCOPHAGE) 1000 MG tablet Take 1 tablet (1,000 mg total) by mouth 2 (two) times daily with food   metoprolol (TOPROL-XL) 200 MG 24 hr tablet Take 1 tablet (200 mg total) by mouth daily. Take with or immediately following a meal. (Patient not taking: Reported on 12/22/2022)   oxybutynin (DITROPAN) 5 MG tablet Take 1 tablet (5 mg total) by mouth 2 (two) times daily as needed for bladder spasms. (Patient not taking: Reported on 12/22/2022)   oxyCODONE (OXY IR/ROXICODONE) 5 MG immediate release tablet Take 1 tablet  by mouth every 4 hours as needed for moderate pain. (Patient not taking: Reported on 12/22/2022)   prochlorperazine (COMPAZINE) 10 MG tablet Take 1 tablet (10 mg total) by mouth every 6 (six) hours as needed for nausea or vomiting. (Patient not taking: Reported on 12/22/2022)   sacubitril-valsartan (ENTRESTO) 97-103 MG Take 1 tablet by mouth 2 (two) times daily. (Patient not taking: Reported on 12/22/2022)   spironolactone (ALDACTONE) 25 MG tablet Take 1 tablet (25 mg total) by mouth daily.   tamsulosin (FLOMAX) 0.4 MG CAPS capsule Take 1 capsule (0.4 mg total) by mouth daily after supper. (Patient not taking: Reported on 12/22/2022)   No facility-administered encounter medications on file as of 01/13/2023.    Past Medical History:  Diagnosis Date   Chronic combined systolic and diastolic heart failure (HCC)  followed by cardiologist;  a. Echo 03/27/11: EF 50-55%, inferior hypokinesis, moderate LAE, mild RVE, normal pulmonary pressures. ;   b.  TEE (04/03/11): EF 40-45%, mild MR moderate LAE, no LAA clot, mild RAE;  c.  Echocardiogram (02/2013): EF 60-65%, Gr 2 DD, mildly dilated Ao root (Ao root dimension 39 mm), MAC, mild LAE, normal RVF   Complication of anesthesia    pt unable to tolerate versed and fentanyl sedation and required general anesthesia with TEE 02/ 2013   Depression    Diabetes  mellitus, type 2 (HCC)    followed by pcp  (02-21-2021 pt stated does not check blood sugar )   Hx of cardiovascular stress test    Lexiscan Myoview (02/2013): No ischemia or scar, EF 51%, low risk;  cardiac cath 04-21-2016  no CAD, NICM   Malignant neoplasm of urinary bladder (HCC) 12/2020   urologist--- dr Mena Goes;  oncologist-- dr Clelia Croft,  high grade muscle invasion   Morbid obesity (HCC)    NICM (nonischemic cardiomyopathy) (HCC) 04/2016   followed by cardiology;   dx 03/ 2018 per TEE ef 15%;  TEE 04/ 2018 ef 25%;  TEE 07/ 2018 ef 40%;  echo 12/ 2021 ef 55%   OSA (obstructive sleep apnea)    followed by dr t. turner--  no cpap/ bipap:: previously followed  by dr clance sleep study 02-11-2013  very severe osa w/ AHI 108/hr had used cpap until 2017:   repeat sleep study 02-14-2018 in epic severe osa w/ hypoxemia, AHI 46.3/hr titrated 04-15-2018 used cpap for awhile   Persistent atrial fibrillation (HCC) 03/2011   cardiologist--   s/p TEE-DCCV (failed);  Amiodarone started    Past Surgical History:  Procedure Laterality Date   CARDIOVERSION  04/01/2011   Procedure: CARDIOVERSION;  Surgeon: Lewayne Bunting, MD;  Location: [facility] ENDOSCOPY;  Service: Cardiovascular;  Laterality: N/A;   CARDIOVERSION  04/03/2011   Procedure: CARDIOVERSION;  Surgeon: Lewayne Bunting, MD;  Location: [facility] ENDOSCOPY;  Service: Cardiovascular;  Laterality: N/A;   CARDIOVERSION  04/03/2011   Procedure: CARDIOVERSION;  Surgeon: Lewayne Bunting, MD;  Location: [facility] OR;  Service: Cardiovascular;  Laterality: N/A;   CARDIOVERSION N/A 06/15/2014   Procedure: CARDIOVERSION;  Surgeon: Laurey Morale, MD;  Location: [facility] ENDOSCOPY;  Service: Cardiovascular;  Laterality: N/A;   CARDIOVERSION N/A 09/08/2016   Procedure: CARDIOVERSION;  Surgeon: Laurey Morale, MD;  Location: [facility] ENDOSCOPY;  Service: Cardiovascular;  Laterality: N/A;   I & D EXTREMITY Right 09/19/2015   Procedure: IRRIGATION AND DEBRIDEMENT EXTREMITY;  Surgeon:  Kathryne Hitch, MD;  Location: [facility] OR;  Service: Orthopedics;  Laterality: Right;   RIGHT/LEFT HEART CATH AND CORONARY ANGIOGRAPHY N/A 04/21/2016   Procedure: Right/Left Heart Cath and Coronary Angiography;  Surgeon: Laurey Morale, MD;  Location: [facility] INVASIVE CV LAB;  Service: Cardiovascular;  Laterality: N/A;   TEE WITHOUT CARDIOVERSION  04/01/2011   Procedure: TRANSESOPHAGEAL ECHOCARDIOGRAM (TEE);  Surgeon: Lewayne Bunting, MD;  Location: [facility] ENDOSCOPY;  Service: Cardiovascular;  Laterality: N/A;   TEE WITHOUT CARDIOVERSION  04/03/2011   Procedure: TRANSESOPHAGEAL ECHOCARDIOGRAM (TEE);  Surgeon: Lewayne Bunting, MD;  Location: [facility] ENDOSCOPY;  Service: Cardiovascular;  Laterality: N/A;   TEE WITHOUT CARDIOVERSION  04/03/2011   Procedure: TRANSESOPHAGEAL ECHOCARDIOGRAM (TEE);  Surgeon: Lewayne Bunting, MD;  Location: [facility] OR;  Service: Cardiovascular;  Laterality: N/A;   TEE WITHOUT CARDIOVERSION N/A 04/24/2016   Procedure: TRANSESOPHAGEAL ECHOCARDIOGRAM (TEE);  Surgeon: Laurey Morale, MD;  Location: [facility] ENDOSCOPY;  Service: Cardiovascular;  Laterality: N/A;   TEE WITHOUT CARDIOVERSION N/A 05/26/2016   Procedure: TRANSESOPHAGEAL ECHOCARDIOGRAM (TEE);  Surgeon: Laurey Morale, MD;  Location: [facility] ENDOSCOPY;  Service: Cardiovascular;  Laterality: N/A;   TEE WITHOUT CARDIOVERSION N/A 09/08/2016   Procedure: TRANSESOPHAGEAL ECHOCARDIOGRAM (TEE);  Surgeon: Laurey Morale, MD;  Location: [facility] ENDOSCOPY;  Service: Cardiovascular;  Laterality: N/A;   TRANSURETHRAL RESECTION OF BLADDER TUMOR N/A 12/14/2020   Procedure: TRANSURETHRAL RESECTION OF BLADDER TUMOR (TURBT) 2-5 cm/ POST OPERTIVE INSTILLATION OF GEMCITABINE;  Surgeon: Jerilee Field, MD;  Location: [facility];  Service: Urology;  Laterality: N/A;   TRANSURETHRAL RESECTION OF BLADDER TUMOR N/A 02/22/2021   Procedure: TRANSURETHRAL RESECTION OF BLADDER TUMOR (TURBT);  Surgeon: Jerilee Field, MD;  Location: [facility];  Service: Urology;  Laterality: N/A;    Family History  Problem Relation Age of Onset   Diabetes Father        died in his 77s   Hypertension Father    Seizures Mother        died in her 33s   Hypertension Sister    Hypertension Paternal Grandfather    Heart attack Neg Hx    Stroke Neg Hx     Social History   Socioeconomic History   Marital status: Single    Spouse name: Not on file   Number of children: 0   Years of education: 12   Highest education level: Not on file  Occupational History   Occupation: unemployeed  Tobacco Use   Smoking status: Never   Smokeless tobacco: Never  Vaping Use   Vaping status: Never Used  Substance and Sexual Activity   Alcohol use: Yes    Comment: some   Drug use: Not Currently   Sexual activity: Not on file  Other Topics Concern   Not on file  Social History Narrative   Admits that he eats poorly and eats a lot at work.      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   Social Determinants of Health   Financial Resource Strain: High Risk (07/30/2020)   Overall Financial Resource Strain (CARDIA)    Difficulty of Paying Living Expenses: Hard  Food Insecurity: Food Insecurity Present (07/30/2020)   Hunger Vital Sign    Worried About Running Out of Food in the Last Year: Sometimes true    Ran Out of Food in the Last Year: Sometimes true  Transportation Needs: No Transportation Needs (07/30/2020)   PRAPARE - Administrator, Civil Service (Medical): No    Lack of Transportation (Non-Medical): No  Physical Activity: Inactive (07/30/2020)   Exercise Vital Sign    Days of Exercise per Week: 0 days    Minutes of Exercise per Session: 0 min  Stress: Stress Concern Present (07/30/2020)   [employer] of Occupational Health - Occupational Stress Questionnaire    Feeling of Stress : To some extent  Social Connections: Socially Isolated (07/30/2020)   Social Connection and Isolation Panel [NHANES]     Frequency of Communication with Friends and Family: More than three times a week    Frequency of Social Gatherings with Friends and Family: Never    Attends Religious Services: Never    Database administrator or Organizations: No    Attends Banker Meetings: Never    Marital Status: Never married  Intimate Partner Violence: Not At Risk (07/30/2020)   Humiliation, Afraid, Rape, and Kick questionnaire  Fear of Current or Ex-Partner: No    Emotionally Abused: No    Physically Abused: No    Sexually Abused: No    ROS      Objective    There were no vitals taken for this visit.  Physical Exam     Assessment & Plan:   There are no diagnoses linked to this encounter.  No follow-ups on file.   [name], MD

## 2023-01-13 NOTE — Patient Instructions (Signed)
It was nice to see you today,  We addressed the following topics today: -We will need to get blood work on you sometime between now and when I see you again - You missed an appointment with the urologist yesterday.  Please call them and schedule a new appointment.   Have a great day,  Frederic Jericho, MD

## 2023-01-14 DIAGNOSIS — R972 Elevated prostate specific antigen [PSA]: Secondary | ICD-10-CM | POA: Insufficient documentation

## 2023-01-14 DIAGNOSIS — N1832 Chronic kidney disease, stage 3b: Secondary | ICD-10-CM | POA: Insufficient documentation

## 2023-01-14 DIAGNOSIS — N1831 Chronic kidney disease, stage 3a: Secondary | ICD-10-CM | POA: Insufficient documentation

## 2023-01-14 NOTE — Assessment & Plan Note (Signed)
On jardiance and metformin.  Will need to consider switching metformin to alternative based on CKD.  Gfr is borderline for continuing metformin.   Continue medication Getting A1c Request ophtho records from [facility].

## 2023-01-14 NOTE — Assessment & Plan Note (Signed)
Psa elevated from 2 years ago.  Do not see a repeat.  Has a urologist he already sees for bladder cancer.  Will recheck.  Further workup per his urologist.

## 2023-01-14 NOTE — Assessment & Plan Note (Signed)
Repeat cmp and cystatin c.  Likely d/t uncontrolled dm.  Consider nephro referral if gfr is worsening.

## 2023-01-15 ENCOUNTER — Encounter (HOSPITAL_COMMUNITY): Payer: 59

## 2023-01-15 NOTE — Progress Notes (Incomplete)
Advanced Heart Failure Clinic Note   PCP: Darius Kitty, MD  HF Cardiologist:  Dr. Shirlee Darius [name]  HPI:  Mr. Darius Darius [name] is a 67 y.o. 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   Admitted 04/17/16-04/24/16 for acute systolic CHF and atrial fibrillation with RVR. Had a R/L heart cath on 04/21/16, no CAD, right heart pressures elevated, low output. Echo showed EF 15%. Plan was to undergo TEE/DC-CV, however he was found to have a left atrial appendage thrombus and had only started Eliquis 5 days prior. He was discharged with plans for TEE/DC-CV in about a month. He was started on Entresto 24/26 mg BID during admission. Discharged home on Lasix 40 mg daily, digoxin 0.125mg , and metoprolol XL 50 mg BID. Discharge 280 pounds.   Repeat TEE done in 4/18 for possible cardioversion.  EF remained 25-30%.  He still had a small, amorphous LA appendage thrombus (smaller than prior but still present).  He admitted to having missed some Eliquis doses.    He finally had TEE-guided DCCV in 7/18 with conversion to NSR.  TEE showed EF 40-45%.  After this, he went back into atrial fibrilation.  Given poor compliance with followup and recurrent atrial fibrillation, it was decided to leave him in atrial fibrillation as rate was well-controlled.    Echo in 12/21 showed that EF remains 55% with mild LVH, mildly decreased RV systolic function, IVC normal.   Bladder cancer was diagnosed in 11/22 and patient has undergone treatment.   Echo 11/23 showed EF 50-55%  He was seen recently on 12/22/22. He had been lost to f/u and this was his first visit back in 1 year. He had been off of all of his cardiac meds. He was noted to be in afib w/ RVR but asymptomatic and euvolemic. ReDs was 25%. BP was noted to be too soft to restart GDMT. He was placed back on PO amiodarone, Eliquis and atorvastatin. Repeat echo was recommended.    He presents back today for f/u to reassess rhythm, volume status and attempt to up titrate GDMT.       ECG (personally reviewed): ***  REDs: ***  Labs (1/19): K 4.6, creatinine 0.97, LFTs normal, BNP 129 Labs (9/21): LDL 65, HDl 53, K 4.9, creatinine 6.96 Labs (3/22): K 4.2, creatinine 1.38 Labs (4/23): K 4.2, creatinine 1.92 Labs (11/23): K 4.6, creatinine 1.57 Labs (11/24): K 3.9, creatinine 1.86   Cardiac Studies  - Echo 11/23: EF 50-55% - Echo 12/21: EF remains 55% with mild LVH, mildly decreased RV systolic function, IVC normal.  - Echo 8/19: EF 55-60% - TEE 7/18 EF 40-45% No thrombus. Successful DC-CV - TEE: 4/18 with EF 25-30%, diffuse hypokinesis, RV mildly dilated with moderately decreased systolic function, amorphous thrombus LA appendage (improved compared to 3/18).  - RHC/LHC 3/18: no significant CAD; RA 14, PA 50/28 (36), PCWP 22, CO/CI (Fick) 4.85/1.89, PVR 2.9 WU - TEE: 3/18 with LAA thrombus.  - Echo 3/18 EF 15% Peak PA pressure 38 mm hg   Review of systems complete and found to be negative unless listed in HPI.   SH:  Social History   Socioeconomic History   Marital status: Single    Spouse name: Not on file   Number of children: 0   Years of education: 12   Highest education level: Not on file  Occupational History   Occupation: unemployeed  Tobacco Use  Smoking status: Never   Smokeless tobacco: Never  Vaping Use   Vaping status: Never Used  Substance and Sexual Activity   Alcohol use: Yes    Comment: some   Drug use: Not Currently   Sexual activity: Not on file  Other Topics Concern   Not on file  Social History Narrative   Admits that he eats poorly and eats a lot at work.      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   Social Determinants of Health   Financial Resource Strain: High Risk (07/30/2020)   Overall Financial Resource Strain (CARDIA)    Difficulty of Paying Living Expenses: Hard  Food Insecurity:  Food Insecurity Present (07/30/2020)   Hunger Vital Sign    Worried About Running Out of Food in the Last Year: Sometimes true    Ran Out of Food in the Last Year: Sometimes true  Transportation Needs: No Transportation Needs (07/30/2020)   PRAPARE - Administrator, Civil Service (Medical): No    Lack of Transportation (Non-Medical): No  Physical Activity: Inactive (07/30/2020)   Exercise Vital Sign    Days of Exercise per Week: 0 days    Minutes of Exercise per Session: 0 min  Stress: Stress Concern Present (07/30/2020)   Darius Darius [name]    Feeling of Stress : To some extent  Social Connections: Socially Isolated (07/30/2020)   Social Connection and Isolation Panel [NHANES]    Frequency of Communication with Friends and Family: More than three times a week    Frequency of Social Gatherings with Friends and Family: Never    Attends Religious Services: Never    Database administrator or Organizations: No    Attends Banker Meetings: Never    Marital Status: Never married  Intimate Partner Violence: Not At Risk (07/30/2020)   Humiliation, Afraid, Rape, and Kick Darius [name]    Fear of Current or Ex-Partner: No    Emotionally Abused: No    Physically Abused: No    Sexually Abused: No   FH:  Family History  Problem Relation Age of Onset   Diabetes Father        died in his 96s   Hypertension Father    Seizures Mother        died in her 82s   Hypertension Sister    Hypertension Paternal Grandfather    Heart attack Neg Hx    Stroke Neg Hx    Past Medical History:  Diagnosis Date   Chronic combined systolic and diastolic heart failure (HCC)    followed by cardiologist;  a. Echo 03/27/11: EF 50-55%, inferior hypokinesis, moderate LAE, mild RVE, normal pulmonary pressures. ;   b.  TEE (04/03/11): EF 40-45%, mild MR moderate LAE, no LAA clot, mild RAE;  c.  Echocardiogram (02/2013): EF 60-65%, Gr 2 DD,  mildly dilated Ao root (Ao root dimension 39 mm), MAC, mild LAE, normal RVF   Complication of anesthesia    pt unable to tolerate versed and fentanyl sedation and required general anesthesia with TEE 02/ 2013   Depression    Diabetes mellitus, type 2 (HCC)    followed by pcp  (02-21-2021 pt stated does not check blood sugar )   Hx of cardiovascular stress test    Lexiscan Myoview (02/2013): No ischemia or scar, EF 51%, low risk;  cardiac cath 04-21-2016  no CAD, NICM   Malignant neoplasm of urinary bladder (HCC)  12/2020   urologist--- dr Mena Goes;  oncologist-- dr Clelia Croft,  high grade muscle invasion   Morbid obesity (HCC)    NICM (nonischemic cardiomyopathy) (HCC) 04/2016   followed by cardiology;   dx 03/ 2018 per TEE ef 15%;  TEE 04/ 2018 ef 25%;  TEE 07/ 2018 ef 40%;  echo 12/ 2021 ef 55%   OSA (obstructive sleep apnea)    followed by dr t. turner--  no cpap/ bipap:: previously followed  by dr clance sleep study 02-11-2013  very severe osa w/ AHI 108/hr had used cpap until 2017:   repeat sleep study 02-14-2018 in epic severe osa w/ hypoxemia, AHI 46.3/hr titrated 04-15-2018 used cpap for awhile   Persistent atrial fibrillation (HCC) 03/2011   cardiologist--   s/p TEE-DCCV (failed);  Amiodarone started   Current Outpatient Medications  Medication Sig Dispense Refill   amiodarone (PACERONE) 200 MG tablet Take 1 tablet (200 mg total) by mouth 2 (two) times daily for 14 days, THEN 1 tablet (200 mg total) daily. 44 tablet 0   apixaban (ELIQUIS) 5 MG TABS tablet TAKE 1 TABLET (5 MG TOTAL) BY MOUTH 2 (TWO) TIMES DAILY.     atorvastatin (LIPITOR) 80 MG tablet Take 1 tablet (80 mg total) by mouth at bedtime. 30 tablet 11   empagliflozin (JARDIANCE) 10 MG TABS tablet Take 1 tablet (10 mg total) by mouth daily before breakfast. 90 tablet 3   ezetimibe (ZETIA) 10 MG tablet Take 1 tablet (10 mg total) by mouth daily. (Patient not taking: Reported on 12/22/2022) 90 tablet 3   metFORMIN (GLUCOPHAGE) 1000  MG tablet Take 1 tablet (1,000 mg total) by mouth 2 (two) times daily with food 60 tablet 1   metoprolol (TOPROL-XL) 200 MG 24 hr tablet Take 1 tablet (200 mg total) by mouth daily. Take with or immediately following a meal. (Patient not taking: Reported on 12/22/2022) 90 tablet 1   oxybutynin (DITROPAN) 5 MG tablet Take 1 tablet (5 mg total) by mouth 2 (two) times daily as needed for bladder spasms. (Patient not taking: Reported on 12/22/2022) 30 tablet 3   oxyCODONE (OXY IR/ROXICODONE) 5 MG immediate release tablet Take 1 tablet  by mouth every 4 hours as needed for moderate pain. (Patient not taking: Reported on 12/22/2022) 30 tablet 0   prochlorperazine (COMPAZINE) 10 MG tablet Take 1 tablet (10 mg total) by mouth every 6 (six) hours as needed for nausea or vomiting. (Patient not taking: Reported on 12/22/2022) 30 tablet 0   sacubitril-valsartan (ENTRESTO) 97-103 MG Take 1 tablet by mouth 2 (two) times daily. (Patient not taking: Reported on 12/22/2022) 60 tablet 3   spironolactone (ALDACTONE) 25 MG tablet Take 1 tablet (25 mg total) by mouth daily. 90 tablet 0   tamsulosin (FLOMAX) 0.4 MG CAPS capsule Take 1 capsule (0.4 mg total) by mouth daily after supper. (Patient not taking: Reported on 12/22/2022) 90 capsule 3   No current facility-administered medications for this visit.   There were no vitals taken for this visit.  Wt Readings from Last 3 Encounters:  01/13/23 108.1 kg (238 lb 6.4 oz)  12/22/22 111.3 kg (245 lb 6.4 oz)  12/22/22 111.3 kg (245 lb 6.4 oz)   PHYSICAL EXAM: General:  Well appearing. No respiratory difficulty HEENT: normal Neck: supple. no JVD. Carotids 2+ bilat; no bruits. No lymphadenopathy or thyromegaly appreciated. Cor: PMI nondisplaced. Regular rate & rhythm. No rubs, gallops or murmurs. Lungs: clear Abdomen: soft, nontender, nondistended. No hepatosplenomegaly. No bruits or masses. Good  bowel sounds. Extremities: no cyanosis, clubbing, rash, edema Neuro:  alert & oriented x 3, cranial nerves grossly intact. moves all 4 extremities w/o difficulty. Affect pleasant.    ASSESSMENT & PLAN: 1. Chronic systolic => diastolic CHF: Had [facility] 04/2016 showing minimal CAD.  Nonischemic cardiomyopathy, possibly tachycardia-mediated in the setting of rapid atrial fibrillation.   Echo 04/2016 EF 15% but improved to 40-45% on TEE in 7/18 and 55-60% by 8/19 echo.  Echo in 12/21 showed that EF remains 55% despite being back in atrial fibrillation.  Echo 11/23 showed EF 50-55%.    He is not volume overloaded on exam, ReDs 25%. NYHA class II symptoms. He has been off all meds x 1 year. - BP too low today to restart GDMT. BMET/BNP today - Repeat Echo  2. Atrial fibrillation: Chronic atrial fibrillation.  Was thought to have tachy-mediated CMP in past, but EF 55% on 12/21 echo in setting of good rate control.  Previously decided not to cardiovert him again as he broke through amiodarone and was not particularly compliant. He remains in atrial fibrillation today, with RVR. He is asymptomatic. - Check CMET, TSH, Mag today - Restart Eliquis 5 mg bid. We discussed stroke risk. - Will attempt re-trial of amiodarone for rate control; start amio 200 mg bid x 2 weeks, then 200 mg daily. Will need to follow LFTs and TSH. He will need regular eye exam while on amiodarone. - I considered adding digoxin for rate control, but health literacy appears low and he has history of poor follow up. Will defer for now. - Refer back to EP (he has seen Dr Elberta Fortis in the past) for AAD (? Tikosyn).   3. HLD: LDL 112 (11/23). Restart atorvastatin 80 mg daily, check lipids & LFTs in 8 weeks.  4. OSA: Needs appointment with sleep medicine to get back on CPAP, looks like he was unable to complete his split night sleep study. Refer back to Dr. Mayford Knife.   5. Diabetes: poorly controlled, due to poor compliance w/ insulin. Recent glucose levels dangerously high in 600-700 range.  - seen PCP recently,  getting restarted on meds   6. Bladder cancer: s/p treatment. Denies hematuria.  Follow up ***  Robbie Lis PA-C  01/15/2023

## 2023-02-02 ENCOUNTER — Other Ambulatory Visit: Payer: 59

## 2023-02-02 DIAGNOSIS — R972 Elevated prostate specific antigen [PSA]: Secondary | ICD-10-CM

## 2023-02-02 DIAGNOSIS — E66813 Obesity, class 3: Secondary | ICD-10-CM

## 2023-02-02 DIAGNOSIS — N1832 Chronic kidney disease, stage 3b: Secondary | ICD-10-CM

## 2023-02-04 LAB — COMPREHENSIVE METABOLIC PANEL
ALT: 16 [IU]/L (ref 0–44)
AST: 14 [IU]/L (ref 0–40)
Albumin: 3.8 g/dL — ABNORMAL LOW (ref 3.9–4.9)
Alkaline Phosphatase: 126 [IU]/L — ABNORMAL HIGH (ref 44–121)
BUN/Creatinine Ratio: 7 — ABNORMAL LOW (ref 10–24)
BUN: 13 mg/dL (ref 8–27)
Bilirubin Total: 0.8 mg/dL (ref 0.0–1.2)
CO2: 21 mmol/L (ref 20–29)
Calcium: 9.1 mg/dL (ref 8.6–10.2)
Chloride: 87 mmol/L — ABNORMAL LOW (ref 96–106)
Creatinine, Ser: 1.81 mg/dL — ABNORMAL HIGH (ref 0.76–1.27)
Globulin, Total: 2.7 g/dL (ref 1.5–4.5)
Glucose: 739 mg/dL (ref 70–99)
Potassium: 4.4 mmol/L (ref 3.5–5.2)
Sodium: 124 mmol/L — ABNORMAL LOW (ref 134–144)
Total Protein: 6.5 g/dL (ref 6.0–8.5)
eGFR: 40 mL/min/{1.73_m2} — ABNORMAL LOW (ref >59)

## 2023-02-04 LAB — PSA: Prostate Specific Ag, Serum: 4.5 ng/mL — ABNORMAL HIGH (ref 0.0–4.0)

## 2023-02-04 LAB — HEMOGLOBIN A1C
Est. average glucose Bld gHb Est-mCnc: >398 mg/dL
Hgb A1c MFr Bld: >15.5 % — ABNORMAL HIGH (ref 4.8–5.6)

## 2023-02-04 LAB — CYSTATIN C: CYSTATIN C: 2.01 mg/L — ABNORMAL HIGH (ref 0.72–1.16)

## 2023-02-05 ENCOUNTER — Telehealth: Payer: Self-pay

## 2023-02-05 ENCOUNTER — Other Ambulatory Visit (HOSPITAL_COMMUNITY): Payer: Self-pay

## 2023-02-05 ENCOUNTER — Other Ambulatory Visit: Payer: Self-pay | Admitting: Family Medicine

## 2023-02-05 DIAGNOSIS — E1100 Type 2 diabetes mellitus with hyperosmolarity without nonketotic hyperglycemic-hyperosmolar coma (NKHHC): Secondary | ICD-10-CM

## 2023-02-05 MED ORDER — BLOOD GLUCOSE TEST VI STRP
1.0000 | ORAL_STRIP | Freq: Three times a day (TID) | 0 refills | Status: DC
  Filled 2023-02-05: qty 100, 34d supply, fill #0

## 2023-02-05 MED ORDER — INSULIN GLARGINE 100 UNIT/ML SOLOSTAR PEN
10.0000 [IU] | PEN_INJECTOR | SUBCUTANEOUS | 11 refills | Status: DC
  Filled 2023-02-05: qty 9, 90d supply, fill #0

## 2023-02-05 MED ORDER — LANCET DEVICE MISC
1.0000 | Freq: Three times a day (TID) | 0 refills | Status: AC
  Filled 2023-02-05: qty 1, 30d supply, fill #0

## 2023-02-05 MED ORDER — BLOOD GLUCOSE MONITOR SYSTEM W/DEVICE KIT
1.0000 | PACK | Freq: Three times a day (TID) | 0 refills | Status: DC
  Filled 2023-02-05: qty 1, 30d supply, fill #0

## 2023-02-05 MED ORDER — ACCU-CHEK SOFTCLIX LANCETS MISC
1.0000 | Freq: Three times a day (TID) | 0 refills | Status: AC
  Filled 2023-02-05: qty 100, 34d supply, fill #0

## 2023-02-05 NOTE — Telephone Encounter (Signed)
I called pt and gave him the result note that Dr. Constance Goltz left. Pt was going to keep the appt for 02/10/23 due to him having another appt with Cardiology tomorrow.   I advise pt that provider states that he was going to contact him before close of day

## 2023-02-05 NOTE — Telephone Encounter (Signed)
-----   Message from [name] sent at 02/05/2023  6:47 AM EST ----- Please call the patient and let him know his results and that I would like him to make an appointment with [place] as soon as he is able to.

## 2023-02-05 NOTE — Progress Notes (Signed)
Was able to reach the patient on the phone this afternoon.  He does not know his mychart password and is unable to currently see mychart messages.    We discussed his lab results.  He does not complain of any symptoms other than a occasional mild nausea with meals.  He has not been compliant with his medications.  He is agreeable to starting insulin daily.  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  Will start with 10 U glargine qam.  When I see him next week we can discuss how to titrate up based on fasting blood sugar readings. I felt this would be too complicated to explain to him over the phone and might lead to inappropriate dosing.  Also sent in new order for a glucometer and supplies.  He has experience with this as well.  Advised him that as long as his fasting blood glucose is > 140 he should give himself the injection.    Also when I see him next week we can discuss pill packs to help with compliance of his medications.  He is not compliant out of concern that he will duplicate his doses.  He does not have a pill box at home.

## 2023-02-06 ENCOUNTER — Ambulatory Visit: Payer: 59 | Attending: Cardiology | Admitting: Cardiology

## 2023-02-06 ENCOUNTER — Encounter: Payer: Self-pay | Admitting: Cardiology

## 2023-02-06 ENCOUNTER — Other Ambulatory Visit (HOSPITAL_COMMUNITY): Payer: Self-pay

## 2023-02-06 VITALS — BP 130/78 | HR 105 | Ht 74.0 in | Wt 231.2 lb

## 2023-02-06 DIAGNOSIS — I4819 Other persistent atrial fibrillation: Secondary | ICD-10-CM | POA: Diagnosis not present

## 2023-02-06 DIAGNOSIS — I5022 Chronic systolic (congestive) heart failure: Secondary | ICD-10-CM

## 2023-02-06 DIAGNOSIS — D6869 Other thrombophilia: Secondary | ICD-10-CM | POA: Diagnosis not present

## 2023-02-06 DIAGNOSIS — G4733 Obstructive sleep apnea (adult) (pediatric): Secondary | ICD-10-CM

## 2023-02-06 NOTE — Progress Notes (Signed)
  Electrophysiology Office Note:   Date:  02/06/2023  ID:  Darius [name], DOB 01-20-1956, MRN [id number]  Primary Cardiologist: Marca Ancona, MD Primary Heart Failure: None Electrophysiologist: Francenia Chimenti Jorja Loa, MD      History of Present Illness:   Darius [name] is a 67 y.o. male with h/o diabetes, atrial fibrillation, sleep apnea, chronic systolic heart failure seen today for routine electrophysiology followup.   Since last being seen in our clinic the patient reports doing overall well.  He has no acute complaints.  He is able to do his daily activities.  He states that he has not taken any of his medications in the last few months.  He is feeling  he denies chest pain, palpitations, dyspnea, PND, orthopnea, nausea, vomiting, dizziness, syncope, edema, weight gain, or early satiety.   Review of systems complete and found to be negative unless listed in HPI.   EP Information / Studies Reviewed:    EKG is not ordered today. EKG from 12/22/22 reviewed which showed atrial fibrillation        Risk Assessment/Calculations:    CHA2DS2-VASc Score = 3   This indicates a 3.2% annual risk of stroke. The patient's score is based upon: CHF History: 1 HTN History: 0 Diabetes History: 1 Stroke History: 0 Vascular Disease History: 0 Age Score: 1 Gender Score: 0            Physical Exam:   VS:  BP 130/78 (BP Location: Right Arm, Patient Position: Sitting, Cuff Size: Large)   Pulse (!) 105   Ht 6\' 2"  (1.88 m)   Wt 231 lb 3.2 oz (104.9 kg)   SpO2 97%   BMI 29.68 kg/m    Wt Readings from Last 3 Encounters:  02/06/23 231 lb 3.2 oz (104.9 kg)  01/13/23 238 lb 6.4 oz (108.1 kg)  12/22/22 245 lb 6.4 oz (111.3 kg)     GEN: Well nourished, well developed in no acute distress NECK: No JVD; No carotid bruits CARDIAC: Irregularly irregular rate and rhythm, no murmurs, rubs, gallops RESPIRATORY:  Clear to auscultation without rales, wheezing or rhonchi  ABDOMEN: Soft,  non-tender, non-distended EXTREMITIES:  No edema; No deformity   ASSESSMENT AND PLAN:    1.  Persistent atrial fibrillation:  2.  Chronic systolic heart failure:  3.  Obstructive sleep apnea: 4.  Hyperlipidemia:  5.  Secondary hypercoagulable state:  Patient with multiple medical problems.  He has a long list of medications.  The patient states that he is not taking any of these medicines in the last few months.  He has been trying to get a pill sorter, but has been unable to obtain 1, and Xiao Graul not take his medications without 1.  He states that he is very disorganized.  As he is not taking his medications, no intervention for his arrhythmia at this time.  Additionally, he has been in atrial fibrillation for quite some time.  I think a rate control strategy would be his best option.  Discussed with referring cardiology  Follow up with Dr. Elberta Fortis  PRN   Signed, Youa Deloney Jorja Loa, MD

## 2023-02-09 ENCOUNTER — Telehealth: Payer: Self-pay | Admitting: Family Medicine

## 2023-02-09 NOTE — Telephone Encounter (Signed)
Called pt he has an appt tomorrow 02/10/2023 he stated that he will bring it

## 2023-02-09 NOTE — Telephone Encounter (Signed)
Copied from CRM [id number]. Topic: Clinical - Medical Advice >> Feb 09, 2023 10:41 AM Darius [name] wrote: Reason for CRM: Patient has some equipment for his blood sugar and needs help on being walked through on how to do the process.

## 2023-02-09 NOTE — Telephone Encounter (Signed)
Copied from CRM [id number]. Topic: Clinical - Medical Advice >> Feb 09, 2023 10:41 AM Ivette P wrote: Reason for CRM: Patient has some equipment for his blood sugar and needs help on being walked through on how to do the process.       Called pt 02/09/2023 he has appt on 02/10/2023

## 2023-02-10 ENCOUNTER — Ambulatory Visit (INDEPENDENT_AMBULATORY_CARE_PROVIDER_SITE_OTHER): Payer: 59 | Admitting: Family Medicine

## 2023-02-10 ENCOUNTER — Other Ambulatory Visit: Payer: Self-pay

## 2023-02-10 ENCOUNTER — Other Ambulatory Visit (HOSPITAL_COMMUNITY): Payer: Self-pay | Admitting: Family Medicine

## 2023-02-10 ENCOUNTER — Other Ambulatory Visit (HOSPITAL_COMMUNITY): Payer: Self-pay

## 2023-02-10 ENCOUNTER — Encounter: Payer: Self-pay | Admitting: Family Medicine

## 2023-02-10 VITALS — BP 85/52 | HR 73 | Ht 74.0 in | Wt 229.0 lb

## 2023-02-10 DIAGNOSIS — I5042 Chronic combined systolic (congestive) and diastolic (congestive) heart failure: Secondary | ICD-10-CM

## 2023-02-10 DIAGNOSIS — I509 Heart failure, unspecified: Secondary | ICD-10-CM | POA: Diagnosis not present

## 2023-02-10 DIAGNOSIS — E1100 Type 2 diabetes mellitus with hyperosmolarity without nonketotic hyperglycemic-hyperosmolar coma (NKHHC): Secondary | ICD-10-CM

## 2023-02-10 DIAGNOSIS — Z794 Long term (current) use of insulin: Secondary | ICD-10-CM | POA: Diagnosis not present

## 2023-02-10 DIAGNOSIS — E1122 Type 2 diabetes mellitus with diabetic chronic kidney disease: Secondary | ICD-10-CM | POA: Diagnosis not present

## 2023-02-10 DIAGNOSIS — I482 Chronic atrial fibrillation, unspecified: Secondary | ICD-10-CM

## 2023-02-10 DIAGNOSIS — N1831 Chronic kidney disease, stage 3a: Secondary | ICD-10-CM

## 2023-02-10 MED ORDER — BLOOD PRESSURE KIT DEVI
0 refills | Status: DC
  Filled 2023-02-10: qty 1, 1d supply, fill #0

## 2023-02-10 MED ORDER — APIXABAN 5 MG PO TABS
5.0000 mg | ORAL_TABLET | Freq: Two times a day (BID) | ORAL | 11 refills | Status: DC
  Filled 2023-02-10 – 2023-02-12 (×2): qty 60, 30d supply, fill #0
  Filled 2023-03-10 – 2023-03-26 (×2): qty 60, 30d supply, fill #1
  Filled 2023-04-13 – 2023-04-21 (×2): qty 60, 30d supply, fill #2

## 2023-02-10 MED ORDER — BLOOD PRESSURE KIT DEVI: 0 refills | Status: DC

## 2023-02-10 MED ORDER — EZETIMIBE 10 MG PO TABS
10.0000 mg | ORAL_TABLET | Freq: Every day | ORAL | 3 refills | Status: DC
  Filled 2023-02-10 – 2023-02-12 (×2): qty 30, 30d supply, fill #0
  Filled 2023-03-10 – 2023-03-26 (×2): qty 30, 30d supply, fill #1
  Filled 2023-04-13 – 2023-04-21 (×2): qty 30, 30d supply, fill #2

## 2023-02-10 NOTE — Patient Instructions (Addendum)
 It was nice to see you today,  We addressed the following topics today: -I want you to take the following medicines and only the following medicines until your blood pressure is not as low as it is now  - Eliquis : 1 pill in the morning, 1 pill in the evening - Atorvastatin : 1 pill in the morning - Zetia  1 pill in the morning - Metformin : 1 pill in the morning, 1 pill in the evening  Do not take any of the other pills why your blood pressure is low  Your Lantus  you will need to give yourself once a day in the morning at 10 units. - Check your fasting blood sugar like I showed you prior to giving yourself insulin  in the morning. - Do not give yourself insulin  if you are blood sugar is less than 100  - I have given you a blood pressure cuff monitor to use at home.  Check your blood pressure and bring the results back to us  when you see us  for a nurse visit later this week.   Have a great day,  Rolan Slain, MD

## 2023-02-10 NOTE — Assessment & Plan Note (Addendum)
>>  ASSESSMENT AND PLAN FOR CHF (CONGESTIVE HEART FAILURE) (HCC) WRITTEN ON 02/10/2023 12:00 PM BY Marcene Laskowski K, MD  Patient sees a cardiologist and is prescribed GDMT for HFrEF.  Because the patient's blood pressure was low today on several checks I advised him not to start taking any of these medications until he is advised to do so by me or his cardiologist.  This includes the SGLT2, the beta-blocker, spironolactone , Entresto .  These instructions were written on his after visit summary.  Patient has follow-up with me next week and a lab visit later this week for blood pressure recheck.  I provided him with a prescription for a blood pressure monitor and advised him to go to [facility].  In the meantime I purchased a blood pressure device from the pharmacy next to us  for him to borrow until he is able to get his own device.  Am working currently with the pharmacy to get his pills organized into adherence packs so that it is less confusing for him.  He has poor health literacy and needs extra help with instructions.

## 2023-02-10 NOTE — Assessment & Plan Note (Signed)
Patient is prescribed amiodarone and metoprolol.  Is not taking either.  Advised patient to continue to hold off while we continue to assess his low blood pressure.  Advised him he can start the Eliquis

## 2023-02-10 NOTE — Assessment & Plan Note (Addendum)
 Patient sees a cardiologist and is prescribed GDMT for HFrEF.  Because the patient's blood pressure was low today on several checks I advised him not to start taking any of these medications until he is advised to do so by me or his cardiologist.  This includes the SGLT2, the beta-blocker, spironolactone , Entresto .  These instructions were written on his after visit summary.  Patient has follow-up with me next week and a lab visit later this week for blood pressure recheck.  I provided him with a prescription for a blood pressure monitor and advised him to go to [facility].  In the meantime I purchased a blood pressure device from the pharmacy next to us  for him to borrow until he is able to get his own device.  Am working currently with the pharmacy to get his pills organized into adherence packs so that it is less confusing for him.  He has poor health literacy and needs extra help with instructions.

## 2023-02-10 NOTE — Assessment & Plan Note (Signed)
 Patient's blood sugar is again elevated.  I checked it using his own device and it was 474, which is an improvement from his BMP the last time it was checked.  He is not taking any medications at this time although he is prescribed metformin  and Lantus  and Jardiance . - Injected the patient with 10 units of Lantus  in the office.  Showed him how to use it and how to dispose of his needles.  Advised him on checking his fasting blood sugar and injecting 10 units if his fasting blood sugar is greater than 100.  I think advising him on titrating up the Lantus  dose would be more confusing for him.  So I did not discuss this with him - Advised him to take the metformin  as directed.  Advised him to hold off on the Jardiance  while his blood pressure is low.

## 2023-02-10 NOTE — Progress Notes (Signed)
 Established Patient Office Visit  Subjective   Patient ID: Darius [name], male    DOB: 1955-08-27  Age: 67 y.o. MRN: [id number]  Chief Complaint  Patient presents with   Medical Management of Chronic Issues    HPI Patient here today for follow-up of his diabetes and other chronic medical issues.  Patient brought his insulin  with him.  He has not used it yet.  He also brought his glucometer and supplies with him.  I opened up the patient's glucometer supplies, demonstrated how to use the glucometer.  Used it on his finger and got a blood glucose reading of 474.  I then opened up his Lantus , showed him how to use the pen.  Dialed it up to 10 units and injected the insulin  into his abdominal subcutaneous tissue.  Patient still has not taken any of his pills out of fear of taking the wrong dosages.  He has obtained a pillbox and another pillbox was given to him by our receptionist, Rosina, today.  Patient did not bring any of his medications other than insulin  with him.  The patient's blood pressure was low today.  Both arms were checked.  It was checked manually and with an automatic cuff.  I performed the manual reading.  My reading was consistent with automatic readings on, roughly 85-90 systolic over 60 diastolic.  Patient is asymptomatic.  Has no complaints.  He again states that he has not taken any of his blood pressure medications.  I called the [facility] pharmacy on the phone while I was with the patient and asked if he was able to get prepackaged adherence pill packs.  They stated they would need to work on it but could provide that service.  Patient does not have a blood pressure cuff at home.  I provided 1 for him to borrow until he is able to get 1.  I advised him to go to [facility] medical supply and gave him a prescription for a blood pressure device.  Advised him to schedule a nurse visit with us  for later this week and an appointment with me next week.  He was told to bring his  pills with him at his nurse visit.  The ASCVD Risk score (Arnett DK, et al., 2019) failed to calculate for the following reasons:   The valid systolic blood pressure range is 90 to 200 mmHg  Health Maintenance Due  Topic Date Due   DTaP/Tdap/Td (1 - Tdap) Never done   Zoster Vaccines- Shingrix (1 of 2) Never done   Colonoscopy  Never done   Pneumonia Vaccine 52+ Years old (2 of 2 - PCV) 03/26/2012   FOOT EXAM  04/04/2020   Diabetic kidney evaluation - Urine ACR  07/26/2020   OPHTHALMOLOGY EXAM  08/15/2020   Medicare Annual Wellness (AWV)  07/30/2021   INFLUENZA VACCINE  09/11/2022   COVID-19 Vaccine (4 - 2024-25 season) 10/12/2022      Objective:     BP (!) 85/52   Pulse 73   Ht 6' 2 (1.88 m)   Wt 229 lb (103.9 kg)   SpO2 91%   BMI 29.40 kg/m    Physical Exam General: Alert, oriented CV: RRR Pulmonary: No respiratory distress, no wheezes or crackles. Psych: Pleasant, talkative.  Eye contact is poor.   No results found for any visits on 02/10/23.      Assessment & Plan:   Type 2 diabetes mellitus with hyperosmolarity without coma, without long-term current use of  insulin  (HCC) Assessment & Plan: Patient's blood sugar is again elevated.  I checked it using his own device and it was 474, which is an improvement from his BMP the last time it was checked.  He is not taking any medications at this time although he is prescribed metformin  and Lantus  and Jardiance . - Injected the patient with 10 units of Lantus  in the office.  Showed him how to use it and how to dispose of his needles.  Advised him on checking his fasting blood sugar and injecting 10 units if his fasting blood sugar is greater than 100.  I think advising him on titrating up the Lantus  dose would be more confusing for him.  So I did not discuss this with him - Advised him to take the metformin  as directed.  Advised him to hold off on the Jardiance  while his blood pressure is low.  Orders: -     Basic  metabolic panel -     C-peptide -     CBC with Differential/Platelet  Chronic atrial fibrillation (HCC) Assessment & Plan: Patient is prescribed amiodarone  and metoprolol .  Is not taking either.  Advised patient to continue to hold off while we continue to assess his low blood pressure.  Advised him he can start the Eliquis    Chronic combined systolic and diastolic heart failure (HCC) Assessment & Plan:  >>ASSESSMENT AND PLAN FOR CHF (CONGESTIVE HEART FAILURE) (HCC) WRITTEN ON 02/10/2023 12:00 PM BY Modine Oppenheimer K, MD  Patient sees a cardiologist and is prescribed GDMT for HFrEF.  Because the patient's blood pressure was low today on several checks I advised him not to start taking any of these medications until he is advised to do so by me or his cardiologist.  This includes the SGLT2, the beta-blocker, spironolactone , Entresto .  These instructions were written on his after visit summary.  Patient has follow-up with me next week and a lab visit later this week for blood pressure recheck.  I provided him with a prescription for a blood pressure monitor and advised him to go to [facility].  In the meantime I purchased a blood pressure device from the pharmacy next to us  for him to borrow until he is able to get his own device.  Am working currently with the pharmacy to get his pills organized into adherence packs so that it is less confusing for him.  He has poor health literacy and needs extra help with instructions.   Other orders -     Blood Pressure Kit; Check blood pressure daily  Dispense: 1 each; Refill: 0     Return in about 1 week (around 02/17/2023) for DM.    Toribio MARLA Slain, MD

## 2023-02-11 ENCOUNTER — Other Ambulatory Visit (HOSPITAL_COMMUNITY): Payer: Self-pay

## 2023-02-11 LAB — CBC WITH DIFFERENTIAL/PLATELET
Basophils Absolute: 0 10*3/uL (ref 0.0–0.2)
Basos: 1 %
EOS (ABSOLUTE): 0.4 10*3/uL (ref 0.0–0.4)
Eos: 9 %
Hematocrit: 47.2 % (ref 37.5–51.0)
Hemoglobin: 15.6 g/dL (ref 13.0–17.7)
Immature Grans (Abs): 0 10*3/uL (ref 0.0–0.1)
Immature Granulocytes: 0 %
Lymphocytes Absolute: 0.7 10*3/uL (ref 0.7–3.1)
Lymphs: 13 %
MCH: 29.8 pg (ref 26.6–33.0)
MCHC: 33.1 g/dL (ref 31.5–35.7)
MCV: 90 fL (ref 79–97)
Monocytes Absolute: 0.5 10*3/uL (ref 0.1–0.9)
Monocytes: 9 %
Neutrophils Absolute: 3.5 10*3/uL (ref 1.4–7.0)
Neutrophils: 68 %
Platelets: 189 10*3/uL (ref 150–450)
RBC: 5.24 x10E6/uL (ref 4.14–5.80)
RDW: 13.4 % (ref 11.6–15.4)
WBC: 5.2 10*3/uL (ref 3.4–10.8)

## 2023-02-11 LAB — BASIC METABOLIC PANEL
BUN/Creatinine Ratio: 9 — ABNORMAL LOW (ref 10–24)
BUN: 12 mg/dL (ref 8–27)
CO2: 19 mmol/L — ABNORMAL LOW (ref 20–29)
Calcium: 9.3 mg/dL (ref 8.6–10.2)
Chloride: 92 mmol/L — ABNORMAL LOW (ref 96–106)
Creatinine, Ser: 1.35 mg/dL — ABNORMAL HIGH (ref 0.76–1.27)
Glucose: 462 mg/dL — ABNORMAL HIGH (ref 70–99)
Potassium: 4.1 mmol/L (ref 3.5–5.2)
Sodium: 131 mmol/L — ABNORMAL LOW (ref 134–144)
eGFR: 57 mL/min/{1.73_m2} — ABNORMAL LOW (ref >59)

## 2023-02-11 LAB — C-PEPTIDE: C-Peptide: 5.4 ng/mL — ABNORMAL HIGH (ref 1.1–4.4)

## 2023-02-12 ENCOUNTER — Other Ambulatory Visit (HOSPITAL_COMMUNITY): Payer: Self-pay

## 2023-02-12 ENCOUNTER — Ambulatory Visit (INDEPENDENT_AMBULATORY_CARE_PROVIDER_SITE_OTHER): Payer: 59 | Admitting: Family Medicine

## 2023-02-12 ENCOUNTER — Other Ambulatory Visit: Payer: Self-pay

## 2023-02-12 ENCOUNTER — Other Ambulatory Visit (HOSPITAL_BASED_OUTPATIENT_CLINIC_OR_DEPARTMENT_OTHER): Payer: Self-pay

## 2023-02-12 VITALS — BP 99/58 | HR 79 | Wt 229.0 lb

## 2023-02-12 DIAGNOSIS — I159 Secondary hypertension, unspecified: Secondary | ICD-10-CM | POA: Diagnosis not present

## 2023-02-12 MED ORDER — BLOOD PRESSURE KIT DEVI
0 refills | Status: DC
  Filled 2023-02-12: qty 1, 1d supply, fill #0

## 2023-02-12 NOTE — Progress Notes (Signed)
 Patient is here for a recheck of his blood pressure

## 2023-02-12 NOTE — Telephone Encounter (Signed)
 Copied from CRM [id number]. Topic: Clinical - Prescription Issue >> Feb 12, 2023 10:07 AM Delon DASEN wrote: Reason for CRM: Patient needs prescription for blood pressure cuff called into pharmacy, now has [facility] but no card yet. [place] [phone] they will cover the new cuff.  Please call patient with any questions [phone]

## 2023-02-13 ENCOUNTER — Other Ambulatory Visit: Payer: Self-pay | Admitting: Family Medicine

## 2023-02-13 DIAGNOSIS — E1100 Type 2 diabetes mellitus with hyperosmolarity without nonketotic hyperglycemic-hyperosmolar coma (NKHHC): Secondary | ICD-10-CM

## 2023-02-13 DIAGNOSIS — I482 Chronic atrial fibrillation, unspecified: Secondary | ICD-10-CM

## 2023-02-13 DIAGNOSIS — I5042 Chronic combined systolic (congestive) and diastolic (congestive) heart failure: Secondary | ICD-10-CM

## 2023-02-13 NOTE — Addendum Note (Signed)
 Addended by: [name] on: 02/13/2023 12:58 PM   Modules accepted: Orders

## 2023-02-13 NOTE — Progress Notes (Signed)
 Patient's blood pressure is better, although still low overall.  He has not started any medications yet since the last time I saw him (2 days ago).  He states that his insurance told him if a prescription was sent to the pharmacy for a blood pressure device it would be covered.  Had previously given the patient a DME prescription to take to [facility] medical supply for this.  Advised patient to hold off on this and I will order it through his pharmacy.  Encouraged the patient to take his medications that we listed on his last visit.  Encouraged patient to give himself the insulin , which he has not done since we injected him in the office with his insulin  pen.

## 2023-02-13 NOTE — Progress Notes (Addendum)
 Patient's blood pressure has been under 100 at multiple visits here and at other offices.  Some of these have been under 90 systolic, although he remains asymptomatic during these times.  This is without taking any of his currently prescribed medications at all for several months at least.  I have removed his metoprolol  for A-fib/CHF, as well as his Entresto , spironolactone , and Jardiance .  At his current blood pressure readings these would likely cause significant hypotension.  Currently working with his pharmacy regarding getting pill packs to help with adherence for the remaining medications: Eliquis , Lipitor , Zetia , metformin , tamsulosin .  I will reach out to his cardiologist regarding this and if they feel his blood pressure would be able to tolerate his amiodarone  for A-fib, we can add that to his pill packs as well.  Addendum: Messaged Dr. Inocencio regarding the amiodarone  and he advised patient can remain off amiodarone  at this time.

## 2023-02-16 ENCOUNTER — Other Ambulatory Visit (HOSPITAL_COMMUNITY): Payer: 59

## 2023-02-17 ENCOUNTER — Encounter: Payer: Self-pay | Admitting: Family Medicine

## 2023-02-17 ENCOUNTER — Ambulatory Visit (INDEPENDENT_AMBULATORY_CARE_PROVIDER_SITE_OTHER): Payer: 59 | Admitting: Family Medicine

## 2023-02-17 ENCOUNTER — Other Ambulatory Visit (HOSPITAL_COMMUNITY): Payer: Self-pay

## 2023-02-17 VITALS — BP 92/58 | HR 68 | Ht 74.0 in | Wt 233.8 lb

## 2023-02-17 DIAGNOSIS — I5042 Chronic combined systolic (congestive) and diastolic (congestive) heart failure: Secondary | ICD-10-CM | POA: Diagnosis not present

## 2023-02-17 DIAGNOSIS — I482 Chronic atrial fibrillation, unspecified: Secondary | ICD-10-CM | POA: Diagnosis not present

## 2023-02-17 DIAGNOSIS — I959 Hypotension, unspecified: Secondary | ICD-10-CM

## 2023-02-17 DIAGNOSIS — E1100 Type 2 diabetes mellitus with hyperosmolarity without nonketotic hyperglycemic-hyperosmolar coma (NKHHC): Secondary | ICD-10-CM

## 2023-02-17 DIAGNOSIS — Z7984 Long term (current) use of oral hypoglycemic drugs: Secondary | ICD-10-CM

## 2023-02-17 MED ORDER — BLOOD GLUCOSE TEST VI STRP
1.0000 | ORAL_STRIP | Freq: Three times a day (TID) | 0 refills | Status: AC
  Filled 2023-02-17 (×2): qty 100, 34d supply, fill #0

## 2023-02-17 NOTE — Assessment & Plan Note (Signed)
 Patient's GDMT medications for heart failure have been removed as requested due to hypotension and were not included in the pill packs.  Will need to continue to monitor his blood pressure at home.  A prescription for a blood pressure cuff was sent in to his pharmacy.

## 2023-02-17 NOTE — Assessment & Plan Note (Signed)
 Patient to pick up his blood pressure cuff today at the pharmacy.  He also has 1 that I provided for him at his last visit at home.  Blood pressure today was above 90 systolic.  His pill packs do not have any blood pressure lowering medications included in them.

## 2023-02-17 NOTE — Assessment & Plan Note (Signed)
 Patient has been giving himself 10 units of insulin  daily for the last 3 days.  Did not give it today yet.  Blood sugar today was 521 but this was nonfasting and he had just had drank some juice prior to coming in.  He feels improvement in his symptoms including nausea.  He will start metformin  tomorrow.  Refill of his test strips was sent in after he lost his other strips.  Patient has labs tomorrow with his cardiologist so I have added a BMP in case it is not one of the labs included.  Advised him to continue checking his daily fasting blood sugar, continuing insulin , and starting metformin .  Once he is better at checking his blood sugar consistently we can discuss increasing his insulin  dosing.

## 2023-02-17 NOTE — Assessment & Plan Note (Signed)
 I have talked with Dr. Inocencio who agreed to hold off on restarting amiodarone  since he has been noncompliant for so long.  Patient's pill pack contains Eliquis .  Does not contain amiodarone  or any of the other medications we removed due to low blood pressure.

## 2023-02-17 NOTE — Progress Notes (Signed)
 Established Patient Office Visit  Subjective   Patient ID: Darius [name], male    DOB: 19-Jul-1955  Age: 68 y.o. MRN: [id number]  Chief Complaint  Patient presents with   Medical Management of Chronic Issues    HPI  Blood pressure-patient has not picked up the new blood pressure cuff yet.  Plans to pick it up today from the pharmacy.  Medication adherence-patient brought his pill pack with him today.  It comes in a white box for a month supply.  He received 3 of these.  Did not start taking any of them yet.  First date listed on the pack is 1/8.  They include a 8 AM pack of Eliquis , Zetia  and metformin , a 6 PM pack of metformin , and a 10 PM pack of atorvastatin  and Eliquis .  Discussed with him that these times are not strict and as long as he takes the morning dose in the morning and the evening dose in the evening it does not have to be the exact times.    Diabetes-patient started giving himself insulin  on Friday.  Also gave himself insulin  Saturday Sunday, but not yet today.  Rechecked his blood glucose which was elevated again at 521.  He states he just drank some juice.  Advised him to take his insulin  today.  He has not been checking his blood sugar at home with the glucometer because he lost the test strips.  I advised him I would send in new prescription for test trips.  He can pick this up today.  He has noticed that his nausea has been better since giving himself the insulin .  Patient states he has some labs tomorrow scheduled for the cardiologist but does not know which labs they are.  The 10-year ASCVD risk score (Arnett DK, et al., 2019) is: 18.5%  Health Maintenance Due  Topic Date Due   DTaP/Tdap/Td (1 - Tdap) Never done   Zoster Vaccines- Shingrix (1 of 2) Never done   Colonoscopy  Never done   Pneumonia Vaccine 20+ Years old (2 of 2 - PCV) 03/26/2012   FOOT EXAM  04/04/2020   Diabetic kidney evaluation - Urine ACR  07/26/2020   OPHTHALMOLOGY EXAM  08/15/2020    Medicare Annual Wellness (AWV)  07/30/2021   INFLUENZA VACCINE  09/11/2022   COVID-19 Vaccine (4 - 2024-25 season) 10/12/2022      Objective:     BP (!) 92/58   Pulse 68   Ht 6' 2 (1.88 m)   Wt 233 lb 12.8 oz (106.1 kg)   SpO2 99%   BMI 30.02 kg/m    Physical Exam General: Alert, oriented Pulmonary: No respiratory distress Psych: Pleasant.  Talkative.   No results found for any visits on 02/17/23.      Assessment & Plan:   Chronic atrial fibrillation [facility]) Assessment & Plan: I have talked with Dr. Inocencio who agreed to hold off on restarting amiodarone  since he has been noncompliant for so long.  Patient's pill pack contains Eliquis .  Does not contain amiodarone  or any of the other medications we removed due to low blood pressure.   Chronic combined systolic and diastolic heart failure (HCC) Assessment & Plan: Patient's GDMT medications for heart failure have been removed as requested due to hypotension and were not included in the pill packs.  Will need to continue to monitor his blood pressure at home.  A prescription for a blood pressure cuff was sent in to his pharmacy.   Type 2  diabetes mellitus with hyperosmolarity without coma, without long-term current use of insulin  [facility]) Assessment & Plan: Patient has been giving himself 10 units of insulin  daily for the last 3 days.  Did not give it today yet.  Blood sugar today was 521 but this was nonfasting and he had just had drank some juice prior to coming in.  He feels improvement in his symptoms including nausea.  He will start metformin  tomorrow.  Refill of his test strips was sent in after he lost his other strips.  Patient has labs tomorrow with his cardiologist so I have added a BMP in case it is not one of the labs included.  Advised him to continue checking his daily fasting blood sugar, continuing insulin , and starting metformin .  Once he is better at checking his blood sugar consistently we can discuss increasing  his insulin  dosing.  Orders: -     Basic metabolic panel; Future  Hypotension, unspecified hypotension type Assessment & Plan: Patient to pick up his blood pressure cuff today at the pharmacy.  He also has 1 that I provided for him at his last visit at home.  Blood pressure today was above 90 systolic.  His pill packs do not have any blood pressure lowering medications included in them.   Other orders -     Blood Glucose Test; test in the morning, at noon, and at bedtime.  Dispense: 100 strip; Refill: 0     Return in about 4 weeks (around 03/17/2023) for DM, HTN.    Toribio MARLA Slain, MD

## 2023-02-17 NOTE — Patient Instructions (Signed)
 It was nice to see you today,  We addressed the following topics today: -Your pill pack contains the correct medications.  They have the dates and times you are supposed to take them.  As long as you take the morning pills in the morning and the evening pill is in the evening it does not have to be exactly the time listed on the pill pack. - I will send in a new order of test strips for you to pick up from the pharmacy.  Please make sure you check your blood sugar in the morning every morning before you eat anything and do not give yourself insulin  if it is less than 100.  Have a great day,  Rolan Slain, MD

## 2023-02-18 ENCOUNTER — Ambulatory Visit (HOSPITAL_COMMUNITY)
Admission: RE | Admit: 2023-02-18 | Discharge: 2023-02-18 | Disposition: A | Discharge status: Home / Self Care | Payer: Medicare PPO | Source: Ambulatory Visit | Attending: Cardiology | Admitting: Cardiology

## 2023-02-18 DIAGNOSIS — E1100 Type 2 diabetes mellitus with hyperosmolarity without nonketotic hyperglycemic-hyperosmolar coma (NKHHC): Secondary | ICD-10-CM | POA: Diagnosis present

## 2023-02-18 DIAGNOSIS — I5022 Chronic systolic (congestive) heart failure: Secondary | ICD-10-CM | POA: Diagnosis not present

## 2023-02-18 LAB — BASIC METABOLIC PANEL
Anion gap: 10 (ref 5–15)
BUN: 10 mg/dL (ref 8–23)
CO2: 25 mmol/L (ref 22–32)
Calcium: 8.8 mg/dL — ABNORMAL LOW (ref 8.9–10.3)
Chloride: 96 mmol/L — ABNORMAL LOW (ref 98–111)
Creatinine, Ser: 1.33 mg/dL — ABNORMAL HIGH (ref 0.61–1.24)
GFR, Estimated: 58 mL/min — ABNORMAL LOW (ref >60)
Glucose, Bld: 484 mg/dL — ABNORMAL HIGH (ref 70–99)
Potassium: 4 mmol/L (ref 3.5–5.1)
Sodium: 131 mmol/L — ABNORMAL LOW (ref 135–145)

## 2023-02-18 LAB — HEPATIC FUNCTION PANEL
ALT: 15 U/L (ref 0–44)
AST: 14 U/L — ABNORMAL LOW (ref 15–41)
Albumin: 3 g/dL — ABNORMAL LOW (ref 3.5–5.0)
Alkaline Phosphatase: 94 U/L (ref 38–126)
Bilirubin, Direct: 0.2 mg/dL (ref 0.0–0.2)
Indirect Bilirubin: 0.7 mg/dL (ref 0.3–0.9)
Total Bilirubin: 0.9 mg/dL (ref 0.0–1.2)
Total Protein: 6.2 g/dL — ABNORMAL LOW (ref 6.5–8.1)

## 2023-02-18 LAB — LIPID PANEL
Cholesterol: 188 mg/dL (ref 0–200)
HDL: 51 mg/dL (ref >40)
LDL Cholesterol: 108 mg/dL — ABNORMAL HIGH (ref 0–99)
Total CHOL/HDL Ratio: 3.7 {ratio}
Triglycerides: 147 mg/dL (ref <150)
VLDL: 29 mg/dL (ref 0–40)

## 2023-02-20 ENCOUNTER — Telehealth: Payer: Self-pay

## 2023-02-20 NOTE — Progress Notes (Signed)
 Care Guide Pharmacy Note  02/20/2023 Name: Darius [name] MRN: [id number] DOB: 15-Oct-1955  Referred By: Chandra Toribio POUR, MD Reason for referral: Care Coordination (Outreach to schedule with Pharm d )   Darius [name] is a 68 y.o. year old male who is a primary care patient of Chandra Toribio POUR, MD.  Darius [name] was referred to the pharmacist for assistance related to: DMII  Successful contact was made with the patient to discuss pharmacy services including being ready for the pharmacist to call at least 5 minutes before the scheduled appointment time and to have medication bottles and any blood pressure readings ready for review. The patient agreed to meet with the pharmacist via telephone visit on (date/time).03/06/2023  Darius [name] , RMA     [place]  [facility], [facility] Guide  Direct Dial: [phone]  Website: [email or web address]

## 2023-03-02 ENCOUNTER — Encounter: Payer: Self-pay | Admitting: Urology

## 2023-03-02 ENCOUNTER — Ambulatory Visit (INDEPENDENT_AMBULATORY_CARE_PROVIDER_SITE_OTHER): Payer: Medicare PPO | Admitting: Urology

## 2023-03-02 VITALS — BP 122/78 | HR 65

## 2023-03-02 DIAGNOSIS — R3915 Urgency of urination: Secondary | ICD-10-CM

## 2023-03-02 DIAGNOSIS — Z8551 Personal history of malignant neoplasm of bladder: Secondary | ICD-10-CM

## 2023-03-02 NOTE — Progress Notes (Signed)
03/02/2023 3:08 PM   Darius [name] April 30, 1955 [id number]  Referring provider: No referring provider defined for this encounter.  No chief complaint on file.   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 In Jan 2023, repeat/max TUR with large patches on the bladder neck, right bladder wall, superior, left bladder wall with residual disease.  From Jan - Mar 2023 he underwent radiation+chemo to preserve bladder.    Primary treatment: Jan - Mar 2023 - max TUR+radiation+chemo   Biopsy/TUR: November 2022-TURBT - HG T2 right, right BN HG CIS, right wall HG T1, superior HG T1, left bladder wall HG CIS Jan 2023 - HGT1, CIS    Staging: Aug 2022 CT abd and pelvis 08/22 - a 2.5 cm right posterior bladder mass. No LAD or bone lesions.  Jul 2023 CT C/A/P - no evidence of disease  Oct 2023 CT C/A/P - NED  Nov 2024 CT C/A/P - NED   Surveillance (NCCN):  CTU or MRU (image upper tracts + axial imaging of abdomen/pelvis) every 3-6 mo x 2 years and consider annually   CT chest (preferred) or chest x-ray every 3-6 mo x 2 years and then annually    2) freq, urgency - AUASS = 14. Urgency and nocturia. On tamsulosin and oxybutynin. He had a sleep study pending from pulmonology.  He has obesity. His Cr was 1.01 Nov 2020 and 1.92 in Apr 2023. AuASS = 2.    He returns today in management of bladder cancer. He did not f/u for cystoscopy in Nov 2024. He is well with no dysuria or gross hematuria. Nov 2024 CT reviewed -- benign.   He has a history of A. fib and an atrial thrombus and is on Eliquis. 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 His blood sugar has been high and his Cr was as high as 2.06 Apr 2021 and 1.92 in Apr 2023. Cr 1.57 in Nov 2023. He is on insulin now.    PMH: Past Medical History:  Diagnosis Date   Chronic combined  systolic and diastolic heart failure (HCC)    followed by cardiologist;  a. Echo 03/27/11: EF 50-55%, inferior hypokinesis, moderate LAE, mild RVE, normal pulmonary pressures. ;   b.  TEE (04/03/11): EF 40-45%, mild MR moderate LAE, no LAA clot, mild RAE;  c.  Echocardiogram (02/2013): EF 60-65%, Gr 2 DD, mildly dilated Ao root (Ao root dimension 39 mm), MAC, mild LAE, normal RVF   Complication of anesthesia    pt unable to tolerate versed and fentanyl sedation and required general anesthesia with TEE 02/ 2013   Depression    Diabetes mellitus, type 2 (HCC)    followed by pcp  (02-21-2021 pt stated does not check blood sugar )   Hx of cardiovascular stress test    Lexiscan Myoview (02/2013): No ischemia or scar, EF 51%, low risk;  cardiac cath 04-21-2016  no CAD, NICM   Malignant neoplasm of urinary bladder (HCC) 12/2020   urologist--- dr Mena Goes;  oncologist-- dr Clelia Croft,  high grade muscle invasion   Morbid obesity (HCC)    NICM (nonischemic cardiomyopathy) (HCC) 04/2016   followed by cardiology;   dx 03/ 2018 per TEE ef 15%;  TEE 04/ 2018 ef 25%;  TEE 07/ 2018 ef 40%;  echo 12/ 2021 ef 55%   OSA (obstructive sleep apnea)    followed  by dr t. turner--  no cpap/ bipap:: previously followed  by dr clance sleep study 02-11-2013  very severe osa w/ AHI 108/hr had used cpap until 2017:   repeat sleep study 02-14-2018 in epic severe osa w/ hypoxemia, AHI 46.3/hr titrated 04-15-2018 used cpap for awhile   Persistent atrial fibrillation (HCC) 03/2011   cardiologist--   s/p TEE-DCCV (failed);  Amiodarone started    Surgical History: Past Surgical History:  Procedure Laterality Date   CARDIOVERSION  04/01/2011   Procedure: CARDIOVERSION;  Surgeon: Lewayne Bunting, MD;  Location: [facility] ENDOSCOPY;  Service: Cardiovascular;  Laterality: N/A;   CARDIOVERSION  04/03/2011   Procedure: CARDIOVERSION;  Surgeon: Lewayne Bunting, MD;  Location: [facility] ENDOSCOPY;  Service: Cardiovascular;  Laterality: N/A;    CARDIOVERSION  04/03/2011   Procedure: CARDIOVERSION;  Surgeon: Lewayne Bunting, MD;  Location: [facility] OR;  Service: Cardiovascular;  Laterality: N/A;   CARDIOVERSION N/A 06/15/2014   Procedure: CARDIOVERSION;  Surgeon: Laurey Morale, MD;  Location: [facility] ENDOSCOPY;  Service: Cardiovascular;  Laterality: N/A;   CARDIOVERSION N/A 09/08/2016   Procedure: CARDIOVERSION;  Surgeon: Laurey Morale, MD;  Location: [facility] ENDOSCOPY;  Service: Cardiovascular;  Laterality: N/A;   I & D EXTREMITY Right 09/19/2015   Procedure: IRRIGATION AND DEBRIDEMENT EXTREMITY;  Surgeon: Kathryne Hitch, MD;  Location: [facility] OR;  Service: Orthopedics;  Laterality: Right;   RIGHT/LEFT HEART CATH AND CORONARY ANGIOGRAPHY N/A 04/21/2016   Procedure: Right/Left Heart Cath and Coronary Angiography;  Surgeon: Laurey Morale, MD;  Location: [facility] INVASIVE CV LAB;  Service: Cardiovascular;  Laterality: N/A;   TEE WITHOUT CARDIOVERSION  04/01/2011   Procedure: TRANSESOPHAGEAL ECHOCARDIOGRAM (TEE);  Surgeon: Lewayne Bunting, MD;  Location: [facility] ENDOSCOPY;  Service: Cardiovascular;  Laterality: N/A;   TEE WITHOUT CARDIOVERSION  04/03/2011   Procedure: TRANSESOPHAGEAL ECHOCARDIOGRAM (TEE);  Surgeon: Lewayne Bunting, MD;  Location: [facility] ENDOSCOPY;  Service: Cardiovascular;  Laterality: N/A;   TEE WITHOUT CARDIOVERSION  04/03/2011   Procedure: TRANSESOPHAGEAL ECHOCARDIOGRAM (TEE);  Surgeon: Lewayne Bunting, MD;  Location: [facility] OR;  Service: Cardiovascular;  Laterality: N/A;   TEE WITHOUT CARDIOVERSION N/A 04/24/2016   Procedure: TRANSESOPHAGEAL ECHOCARDIOGRAM (TEE);  Surgeon: Laurey Morale, MD;  Location: [facility] ENDOSCOPY;  Service: Cardiovascular;  Laterality: N/A;   TEE WITHOUT CARDIOVERSION N/A 05/26/2016   Procedure: TRANSESOPHAGEAL ECHOCARDIOGRAM (TEE);  Surgeon: Laurey Morale, MD;  Location: [facility] ENDOSCOPY;  Service: Cardiovascular;  Laterality: N/A;   TEE WITHOUT CARDIOVERSION N/A 09/08/2016   Procedure: TRANSESOPHAGEAL ECHOCARDIOGRAM (TEE);   Surgeon: Laurey Morale, MD;  Location: [facility] ENDOSCOPY;  Service: Cardiovascular;  Laterality: N/A;   TRANSURETHRAL RESECTION OF BLADDER TUMOR N/A 12/14/2020   Procedure: TRANSURETHRAL RESECTION OF BLADDER TUMOR (TURBT) 2-5 cm/ POST OPERTIVE INSTILLATION OF GEMCITABINE;  Surgeon: Jerilee Field, MD;  Location: [facility];  Service: Urology;  Laterality: N/A;   TRANSURETHRAL RESECTION OF BLADDER TUMOR N/A 02/22/2021   Procedure: TRANSURETHRAL RESECTION OF BLADDER TUMOR (TURBT);  Surgeon: Jerilee Field, MD;  Location: [facility];  Service: Urology;  Laterality: N/A;    Home Medications:  Allergies as of 03/02/2023   No Known Allergies      Medication List        Accurate as of March 02, 2023  3:08 PM. If you have any questions, ask your nurse or doctor.          Accu-Chek Guide Test test strip Generic drug: glucose blood test in the morning, at noon, and at bedtime.  Accu-Chek Guide w/Device Kit Test in the morning, at noon, and at bedtime.   Accu-Chek Softclix Lancets lancets Test in the morning, at noon, and at bedtime.   atorvastatin 80 MG tablet Commonly known as: LIPITOR Take 1 tablet (80 mg total) by mouth at bedtime.   Blood Pressure Kit Devi Check blood pressure daily   Eliquis 5 MG Tabs tablet Generic drug: apixaban Take 1 tablet (5 mg total) by mouth 2 (two) times daily.   ezetimibe 10 MG tablet Commonly known as: ZETIA Take 1 tablet (10 mg total) by mouth daily.   Lancet Device Misc Test in the morning, at noon, and at bedtime.   Lantus SoloStar 100 UNIT/ML Solostar Pen Generic drug: insulin glargine Inject 10 Units into the skin every morning.   metFORMIN 1000 MG tablet Commonly known as: GLUCOPHAGE Take 1 tablet (1,000 mg total) by mouth 2 (two) times daily with food   oxybutynin 5 MG tablet Commonly known as: DITROPAN Take 1 tablet (5 mg total) by mouth 2 (two) times daily as needed for bladder spasms.    oxyCODONE 5 MG immediate release tablet Commonly known as: Oxy IR/ROXICODONE Take 1 tablet  by mouth every 4 hours as needed for moderate pain.   prochlorperazine 10 MG tablet Commonly known as: COMPAZINE Take 1 tablet (10 mg total) by mouth every 6 (six) hours as needed for nausea or vomiting.   tamsulosin 0.4 MG Caps capsule Commonly known as: FLOMAX Take 1 capsule (0.4 mg total) by mouth daily after supper.        Allergies: No Known Allergies  Family History: Family History  Problem Relation Age of Onset   Diabetes Father        died in his 80s   Hypertension Father    Seizures Mother        died in her 43s   Hypertension Sister    Hypertension Paternal Grandfather    Heart attack Neg Hx    Stroke Neg Hx     Social History:  reports that he has never smoked. He has never used smokeless tobacco. He reports current alcohol use. He reports that he does not currently use drugs.   Physical Exam: There were no vitals taken for this visit.  Constitutional:  Alert and oriented, No acute distress. HEENT: [place] AT, moist mucus membranes.  Trachea midline, no masses. Cardiovascular: No clubbing, cyanosis, or edema. Respiratory: Normal respiratory effort, no increased work of breathing. GI: Abdomen is soft, nontender, nondistended, no abdominal masses GU: No CVA tenderness Skin: No rashes, bruises or suspicious lesions. Neurologic: Grossly intact, no focal deficits, moving all 4 extremities. Psychiatric: Normal mood and affect.  Laboratory Data: Lab Results  Component Value Date   WBC 5.2 02/10/2023   HGB 15.6 02/10/2023   HCT 47.2 02/10/2023   MCV 90 02/10/2023   PLT 189 02/10/2023    Lab Results  Component Value Date   CREATININE 1.33 (H) 02/18/2023    No results found for: "PSA"  No results found for: "TESTOSTERONE"  Lab Results  Component Value Date   HGBA1C >15.5 (H) 02/02/2023    Urinalysis    Component Value Date/Time   COLORURINE STRAW (A)  12/22/2022 1650   APPEARANCEUR CLEAR 12/22/2022 1650   APPEARANCEUR Clear 09/15/2022 1037   LABSPEC 1.026 12/22/2022 1650   PHURINE 5.0 12/22/2022 1650   GLUCOSEU >=500 (A) 12/22/2022 1650   HGBUR NEGATIVE 12/22/2022 1650   BILIRUBINUR NEGATIVE 12/22/2022 1650   BILIRUBINUR Negative 09/15/2022 1037  KETONESUR NEGATIVE 12/22/2022 1650   PROTEINUR NEGATIVE 12/22/2022 1650   UROBILINOGEN 0.2 10/20/2019 1137   UROBILINOGEN 0.2 02/13/2017 1540   NITRITE NEGATIVE 12/22/2022 1650   LEUKOCYTESUR NEGATIVE 12/22/2022 1650    Lab Results  Component Value Date   LABMICR Comment 09/15/2022   WBCUA 0-5 05/26/2022   LABEPIT 0-10 05/26/2022   MUCUS Present 08/26/2021   BACTERIA NONE SEEN 12/22/2022    Pertinent Imaging: CT Nov 2024   Assessment & Plan:    1. History of bladder cancer (Primary) No worrisome symptoms. CT benign. F/u for cystoscopy.   - Urinalysis, Routine w reflex microscopic   No follow-ups on file.  Jerilee Field, MD  [facility]  [address] [place], [place] [address] [phone]

## 2023-03-03 LAB — MICROSCOPIC EXAMINATION: Bacteria, UA: NONE SEEN

## 2023-03-03 LAB — URINALYSIS, ROUTINE W REFLEX MICROSCOPIC
Bilirubin, UA: NEGATIVE
Ketones, UA: NEGATIVE
Nitrite, UA: NEGATIVE
Protein,UA: NEGATIVE
Specific Gravity, UA: <1.006 (ref 1.005–1.030)
Urobilinogen, Ur: 0.2 mg/dL (ref 0.2–1.0)
pH, UA: 6 (ref 5.0–7.5)

## 2023-03-06 ENCOUNTER — Other Ambulatory Visit: Payer: Self-pay | Admitting: Pharmacist

## 2023-03-06 ENCOUNTER — Telehealth: Payer: Self-pay | Admitting: Pharmacist

## 2023-03-06 NOTE — Progress Notes (Signed)
   03/06/2023  Patient ID: Darius [name], male   DOB: 27-Oct-1955, 68 y.o.   MRN: [id number]  Called patient at phone number listed. Also left HIPAA compliant voicemail on Amy's phone as well per DPR.   For notes at next visit if he returns the call:  [facility] [address]: Amiodarone 200mg  12/22/22 30DS- should have stopped by now Jardiance 10mg  12/24/22 90DS- advised to stop- did he? Lantus 02/06/23 90DS- taking  [facility] in [employer]: Eliquis 5mg  02/12/23 30DS Atorvastatin 80mg - 02/12/23 30DS Zetia 10mg  02/12/23 30DS Metformin 1000mg  02/12/23 30DS  *Will need to add Tamsulosin to packaging; does he has Oxybutynin for bladder spasms PRN?  DM + med management Test strips and meter obtained?  A1c >15.5%

## 2023-03-09 ENCOUNTER — Other Ambulatory Visit: Payer: Self-pay

## 2023-03-10 ENCOUNTER — Other Ambulatory Visit: Payer: Self-pay

## 2023-03-11 ENCOUNTER — Other Ambulatory Visit: Payer: Self-pay

## 2023-03-12 ENCOUNTER — Other Ambulatory Visit: Payer: Self-pay

## 2023-03-13 ENCOUNTER — Other Ambulatory Visit: Payer: Self-pay

## 2023-03-18 ENCOUNTER — Other Ambulatory Visit (HOSPITAL_COMMUNITY): Payer: Self-pay

## 2023-03-18 ENCOUNTER — Telehealth: Payer: Self-pay

## 2023-03-18 ENCOUNTER — Encounter: Payer: Self-pay | Admitting: Family Medicine

## 2023-03-18 ENCOUNTER — Ambulatory Visit (INDEPENDENT_AMBULATORY_CARE_PROVIDER_SITE_OTHER): Payer: Medicare PPO | Admitting: Family Medicine

## 2023-03-18 DIAGNOSIS — E119 Type 2 diabetes mellitus without complications: Secondary | ICD-10-CM | POA: Diagnosis not present

## 2023-03-18 DIAGNOSIS — Z7984 Long term (current) use of oral hypoglycemic drugs: Secondary | ICD-10-CM

## 2023-03-18 MED ORDER — INSULIN GLARGINE 100 UNIT/ML SOLOSTAR PEN: 15.0000 [IU] | PEN_INJECTOR | SUBCUTANEOUS | Status: DC

## 2023-03-18 MED ORDER — METFORMIN HCL 1000 MG PO TABS
1000.0000 mg | ORAL_TABLET | Freq: Two times a day (BID) | ORAL | 3 refills | Status: DC
  Filled 2023-03-18: qty 180, 90d supply, fill #0
  Filled 2023-03-26: qty 60, 30d supply, fill #0
  Filled 2023-04-13 – 2023-04-21 (×2): qty 60, 30d supply, fill #1

## 2023-03-18 NOTE — Assessment & Plan Note (Signed)
 Patient endorses daily compliance with insulin .  Still endorses missing doses of his oral medications even though we have prepackaged them for him to simplify the process.  I simplified things further for him by telling him that his morning medications can be taken anytime before lunch and his evening medications can be taken anytime after lunch.  His POC blood glucose level was greater than 600 so we obtained a BMP.  I increased his Lantus  to 15 units.  Advised him it is important to check his blood sugar fasting in the morning prior to giving insulin .

## 2023-03-18 NOTE — Patient Instructions (Signed)
 It was nice to see you today,  We addressed the following topics today: -Your blood sugar was elevated again.  It was too high to read on our machine so I am sending in some blood test to [facility] - I am increasing your insulin  dose to 15 units a day.  You will need to adjust the dose on your pen. - It is important for you to take your medications.  They do not have to be at exactly the time described.  Morning medications can be taken anytime before lunch, evening medications can be taken anytime after lunch.  Have a great day,  Rolan Slain, MD

## 2023-03-18 NOTE — Progress Notes (Signed)
 Complex Care Management Care Guide Note  03/18/2023 Name: Darius [name] MRN: [id number] DOB: 04-26-1955  Darius [name] is a 68 y.o. year old male who is a primary care patient of Chandra Toribio POUR, MD and is actively engaged with the care management team. I reached out to Ozell JAYSON Irving by phone today to assist with re-scheduling  with the Pharmacist.  Follow up plan: Unsuccessful telephone outreach attempt made. A HIPAA compliant phone message was left for the patient providing contact information and requesting a return call.  Jeoffrey Buffalo , RMA     [facility]  [facility], [facility] Guide  Direct Dial: [phone]  Website: [email or web address]

## 2023-03-18 NOTE — Progress Notes (Signed)
   Established Patient Office Visit  Subjective   Patient ID: Darius [name], male    DOB: [birth year]  Age: 68 y.o. MRN: [id number]  Chief Complaint  Patient presents with   Medical Management of Chronic Issues    HPI  Diabetes-patient still has issues with compliance.  States that because he wakes up at different times he is uncertain if he can take the medications listed for specific time.  He does endorse that he is taking his insulin  daily.  Does not check his blood sugar levels, but does have the device available.  Called to the pharmacy because I saw a note about him needing a refill of metformin .  They confirm which he does not have any refills left so I sent in a new supply with a years worth of refills.  The 10-year ASCVD risk score (Arnett DK, et al., 2019) is: 21.7%  Health Maintenance Due  Topic Date Due   DTaP/Tdap/Td (1 - Tdap) Never done   Zoster Vaccines- Shingrix (1 of 2) Never done   Colonoscopy  Never done   Pneumonia Vaccine 69+ Years old (2 of 2 - PCV) 03/26/2012   Diabetic kidney evaluation - Urine ACR  07/26/2020   OPHTHALMOLOGY EXAM  08/15/2020   Medicare Annual Wellness (AWV)  07/30/2021   INFLUENZA VACCINE  09/11/2022   COVID-19 Vaccine (4 - 2024-25 season) 10/12/2022      Objective:     BP 106/69   Pulse 78   Ht 6' 2 (1.88 m)   Wt 234 lb (106.1 kg)   SpO2 97%   BMI 30.04 kg/m    Physical Exam General: Alert, oriented Pulmonary: No respiratory distress Extremities: Feet are unclean.  No ulcerations.  There is 1 small eschar over the fifth metatarsal on the right that does not look infected.  Sensation to monofilament is poor bilaterally.  Nails are thick and discolored.   No results found for any visits on 03/18/23.      Assessment & Plan:   Type 2 diabetes mellitus without complication, without long-term current use of insulin  [facility]) Assessment & Plan: Patient endorses daily compliance with insulin .  Still endorses missing doses  of his oral medications even though we have prepackaged them for him to simplify the process.  I simplified things further for him by telling him that his morning medications can be taken anytime before lunch and his evening medications can be taken anytime after lunch.  His POC blood glucose level was greater than 600 so we obtained a BMP.  I increased his Lantus  to 15 units.  Advised him it is important to check his blood sugar fasting in the morning prior to giving insulin .  Orders: -     metFORMIN  HCl; Take 1 tablet (1,000 mg total) by mouth 2 (two) times daily with food  Dispense: 180 tablet; Refill: 3 -     Basic metabolic panel  Other orders -     Insulin  Glargine; Inject 15 Units into the skin every morning.     No follow-ups on file.    Toribio MARLA Slain, MD

## 2023-03-19 LAB — BASIC METABOLIC PANEL
BUN/Creatinine Ratio: 10 (ref 10–24)
BUN: 15 mg/dL (ref 8–27)
CO2: 18 mmol/L — ABNORMAL LOW (ref 20–29)
Calcium: 9 mg/dL (ref 8.6–10.2)
Chloride: 88 mmol/L — ABNORMAL LOW (ref 96–106)
Creatinine, Ser: 1.48 mg/dL — ABNORMAL HIGH (ref 0.76–1.27)
Potassium: 4.9 mmol/L (ref 3.5–5.2)
Sodium: 125 mmol/L — ABNORMAL LOW (ref 134–144)
eGFR: 51 mL/min/{1.73_m2} — ABNORMAL LOW (ref >59)

## 2023-03-20 ENCOUNTER — Encounter: Payer: Self-pay | Admitting: Family Medicine

## 2023-03-20 NOTE — Progress Notes (Signed)
 Complex Care Management Care Guide Note  03/20/2023 Name: QUINTERRIUS ERRINGTON MRN: [id number] DOB: 1955/07/21  DAVIDSON PALMIERI is a 68 y.o. year old male who is a primary care patient of Chandra Toribio POUR, MD and is actively engaged with the care management team. I reached out to Ozell JAYSON Irving by phone today to assist with re-scheduling  with the Pharmacist.  Follow up plan: Unsuccessful telephone outreach attempt made. A HIPAA compliant phone message was left for the patient providing contact information and requesting a return call.  Jeoffrey Buffalo , RMA     [facility]  [facility] Guide  Direct Dial: [phone]  Website: [email or web address]

## 2023-03-23 ENCOUNTER — Ambulatory Visit: Payer: Medicare PPO | Admitting: Cardiology

## 2023-03-23 ENCOUNTER — Encounter: Payer: Self-pay | Admitting: Cardiology

## 2023-03-23 NOTE — Progress Notes (Signed)
This encounter was created in error - please disregard.

## 2023-03-25 ENCOUNTER — Other Ambulatory Visit (HOSPITAL_COMMUNITY): Payer: Self-pay

## 2023-03-26 ENCOUNTER — Other Ambulatory Visit: Payer: Self-pay

## 2023-03-26 NOTE — Progress Notes (Signed)
Complex Care Management Care Guide Note  03/26/2023 Name: TRAVEION RUDDOCK MRN: [id number] DOB: [birth year]  CHALMER ZHENG is a 68 y.o. year old male who is a primary care patient of [name], MD and is actively engaged with the care management team. I reached out to Marla Roe by phone today to assist with re-scheduling  with the Pharmacist.  Follow up plan: Unsuccessful telephone outreach attempt made.   Penne Lash , RMA     [facility]  [facility], [facility] Guide  Direct Dial: [phone]  Website: [email or web address]

## 2023-03-27 ENCOUNTER — Telehealth: Payer: Self-pay

## 2023-03-27 NOTE — Telephone Encounter (Signed)
-----   Message from Armanda Magic sent at 03/23/2023  4:12 PM EST ----- He can only take a sleep aid if he is not to take his oxycodone at night ----- Message ----- From: Luellen Pucker, RN Sent: 03/23/2023   4:05 PM EST To: Quintella Reichert, MD  Patient declines home sleep study at this time, states he wants to try in-lab study again. Would he be a candidate for a sleep aid? Alcario Drought, RN

## 2023-03-27 NOTE — Telephone Encounter (Signed)
Call to patient to discuss that Dr. Mayford Knife may be able to recommend use of sleep aid during in lab sleep study testing if he can avoid opiate pain medication. No answer, unable to leave voice mail.  Also sent Mychart message.

## 2023-04-10 ENCOUNTER — Other Ambulatory Visit: Payer: Self-pay | Admitting: Family Medicine

## 2023-04-10 ENCOUNTER — Other Ambulatory Visit (HOSPITAL_COMMUNITY): Payer: Self-pay

## 2023-04-10 MED ORDER — LANTUS SOLOSTAR 100 UNIT/ML ~~LOC~~ SOPN
15.0000 [IU] | PEN_INJECTOR | SUBCUTANEOUS | 11 refills | Status: DC
  Filled 2023-04-10 – 2023-04-13 (×3): qty 15, 100d supply, fill #0
  Filled 2023-06-22 – 2023-07-02 (×2): qty 15, 100d supply, fill #1
  Filled ????-??-??: fill #1

## 2023-04-13 ENCOUNTER — Other Ambulatory Visit: Payer: Self-pay

## 2023-04-13 ENCOUNTER — Other Ambulatory Visit (HOSPITAL_COMMUNITY): Payer: Self-pay

## 2023-04-20 NOTE — Progress Notes (Unsigned)
   Established Patient Office Visit  Subjective   Patient ID: Darius [name], male    DOB: 11/06/1955  Age: 68 y.o. MRN: [id number]  No chief complaint on file.   HPI  Dm - do a glp1 here?    The 10-year ASCVD risk score (Arnett DK, et al., 2019) is: 21.7%  Health Maintenance Due  Topic Date Due   DTaP/Tdap/Td (1 - Tdap) Never done   Zoster Vaccines- Shingrix (1 of 2) Never done   Colonoscopy  Never done   Pneumonia Vaccine 4+ Years old (2 of 2 - PCV) 03/26/2012   Diabetic kidney evaluation - Urine ACR  07/26/2020   OPHTHALMOLOGY EXAM  08/15/2020   Medicare Annual Wellness (AWV)  07/30/2021   INFLUENZA VACCINE  09/11/2022   COVID-19 Vaccine (4 - 2024-25 season) 10/12/2022      Objective:     There were no vitals taken for this visit. {Vitals History (Optional):23777}  Physical Exam   No results found for any visits on 04/21/23.      Assessment & Plan:   There are no diagnoses linked to this encounter.   No follow-ups on file.    [name], MD

## 2023-04-21 ENCOUNTER — Encounter: Payer: Self-pay | Admitting: Family Medicine

## 2023-04-21 ENCOUNTER — Other Ambulatory Visit (HOSPITAL_COMMUNITY): Payer: Self-pay

## 2023-04-21 ENCOUNTER — Other Ambulatory Visit: Payer: Self-pay

## 2023-04-21 ENCOUNTER — Ambulatory Visit (INDEPENDENT_AMBULATORY_CARE_PROVIDER_SITE_OTHER): Payer: Medicare PPO | Admitting: Family Medicine

## 2023-04-21 VITALS — BP 114/72 | HR 99 | Ht 74.0 in | Wt 240.0 lb

## 2023-04-21 DIAGNOSIS — Z7984 Long term (current) use of oral hypoglycemic drugs: Secondary | ICD-10-CM | POA: Diagnosis not present

## 2023-04-21 DIAGNOSIS — E119 Type 2 diabetes mellitus without complications: Secondary | ICD-10-CM | POA: Diagnosis not present

## 2023-04-21 LAB — POCT GLYCOSYLATED HEMOGLOBIN (HGB A1C): HbA1c POC (<> result, manual entry): 15 % (ref 4.0–5.6)

## 2023-04-21 MED ORDER — SEMAGLUTIDE(0.25 OR 0.5MG/DOS) 2 MG/3ML ~~LOC~~ SOPN
0.2500 mg | PEN_INJECTOR | SUBCUTANEOUS | 1 refills | Status: DC
  Filled 2023-04-21: qty 3, 56d supply, fill #0

## 2023-04-21 NOTE — Patient Instructions (Addendum)
 It was nice to see you today,  We addressed the following topics today: -Your A1c is still elevated.  I want you to increase your insulin to 20 units. - I am sending in a prescription for a new medication called Ozempic.  You will take this once a week.  It is an injection much like the insulin injection. - For the first month you will inject 0.25 mg/week - For the second month you will inject 0.5 mg/week - For the third month you will receive a new pen that is 1 mg/week. - If you have any issues with this you can schedule an appointment to discuss it with [place], please bring the pen with you for that appointment - Otherwise we will see you back in 3 months  -For your oral medications I would still like you to take these.  To simplify things if it is before noon you should take the morning dose.  If it is afternoon and you forgot to take the morning dose just skip that dose and take your evening and afternoon doses at the right time.   Have a great day,  Frederic Jericho, MD

## 2023-04-21 NOTE — Assessment & Plan Note (Signed)
 Despite using insulin daily over the past few months his A1c is still terribly elevated.  Will add a Ozempic.  He is compliant with insulin so hopefully he is compliant with the GLP-1.  I will work with his pharmacy to adjust his medications which are already in a pill pack, so that they are just once a day.  Will need to switch all daily medicines to the morning and Eliquis to Xarelto.

## 2023-04-22 ENCOUNTER — Other Ambulatory Visit (HOSPITAL_COMMUNITY): Payer: Self-pay

## 2023-04-22 MED ORDER — EZETIMIBE 10 MG PO TABS
10.0000 mg | ORAL_TABLET | Freq: Every morning | ORAL | 11 refills | Status: DC
  Filled 2023-04-22 – 2023-05-14 (×3): qty 30, 30d supply, fill #0
  Filled 2023-06-01 – 2023-06-09 (×2): qty 30, 30d supply, fill #1
  Filled ????-??-??: fill #2

## 2023-04-22 MED ORDER — RIVAROXABAN 20 MG PO TABS
20.0000 mg | ORAL_TABLET | Freq: Every morning | ORAL | 11 refills | Status: DC
  Filled 2023-04-22 – 2023-05-14 (×3): qty 30, 30d supply, fill #0
  Filled 2023-06-01 – 2023-06-09 (×2): qty 30, 30d supply, fill #1
  Filled ????-??-??: fill #2

## 2023-04-22 MED ORDER — ATORVASTATIN CALCIUM 80 MG PO TABS
80.0000 mg | ORAL_TABLET | Freq: Every morning | ORAL | 11 refills | Status: DC
  Filled 2023-04-22 – 2023-05-14 (×3): qty 30, 30d supply, fill #0
  Filled 2023-06-01 – 2023-06-09 (×2): qty 30, 30d supply, fill #1
  Filled ????-??-??: fill #2

## 2023-04-22 MED ORDER — METFORMIN HCL 1000 MG PO TABS
1000.0000 mg | ORAL_TABLET | Freq: Every morning | ORAL | 11 refills | Status: DC
  Filled 2023-04-22 – 2023-05-14 (×3): qty 30, 30d supply, fill #0
  Filled 2023-06-01 – 2023-06-09 (×2): qty 30, 30d supply, fill #1
  Filled ????-??-??: fill #2

## 2023-04-24 ENCOUNTER — Other Ambulatory Visit: Payer: Self-pay

## 2023-04-27 ENCOUNTER — Other Ambulatory Visit (HOSPITAL_COMMUNITY): Payer: Self-pay

## 2023-04-27 ENCOUNTER — Other Ambulatory Visit: Payer: Medicare PPO | Admitting: Urology

## 2023-04-30 ENCOUNTER — Other Ambulatory Visit: Payer: Self-pay

## 2023-04-30 ENCOUNTER — Telehealth: Payer: Self-pay

## 2023-04-30 NOTE — Telephone Encounter (Signed)
 Letter mailed to patient including information from myChart message that was sent as that message is still showing not read.  Hi Darius [name], We have been trying to reach you in regards to setting you up for an in-lab sleep study as Dr. Mayford Knife recommended.   You had stated that you may be able to tolerate an in-lab sleep study if you could be prescribed a sleep aid. If using a sleep aid for a sleep study, you would have to avoid using your opiate pain medication for a least a day, if not more. If this is something you are willing to consider, perhaps we can move forward with in-lab testing. Please call our office at [phone] or respond to this message to let us know. Thanks, Alcario Drought, RN

## 2023-04-30 NOTE — Telephone Encounter (Signed)
 Copied from CRM [id number]. Topic: Clinical - Prescription Issue >> Apr 30, 2023 11:01 AM Fuller Mandril wrote: Reason for CRM: Patient called, had Ozempic filled Monday but the day after received a letter stating future fills will not be covered. Spoke with insurance. Was told to reach out to provider and have provider contact insurance for approval/options. Thank You

## 2023-05-04 ENCOUNTER — Other Ambulatory Visit: Payer: Self-pay | Admitting: Family Medicine

## 2023-05-04 ENCOUNTER — Other Ambulatory Visit (HOSPITAL_COMMUNITY): Payer: Self-pay

## 2023-05-04 MED ORDER — SEMAGLUTIDE(0.25 OR 0.5MG/DOS) 2 MG/3ML ~~LOC~~ SOPN
0.5000 mg | PEN_INJECTOR | SUBCUTANEOUS | 1 refills | Status: DC
  Filled 2023-06-22 – 2023-08-17 (×3): qty 3, 28d supply, fill #0

## 2023-05-04 NOTE — Telephone Encounter (Signed)
 I am not sure why would not be filled if they filled the first 1, other than that they are saying they will not fill the 0.25 mg dose.  I sent in a order for 0.5 mg to start 28 days after the first dose was filled.

## 2023-05-04 NOTE — Telephone Encounter (Signed)
Called patient LVM to contact the office

## 2023-05-05 ENCOUNTER — Telehealth: Payer: Self-pay

## 2023-05-05 NOTE — Telephone Encounter (Signed)
 Called patient he stated that he has his medication from the pharmacy

## 2023-05-07 ENCOUNTER — Other Ambulatory Visit (HOSPITAL_COMMUNITY): Payer: Self-pay

## 2023-05-14 ENCOUNTER — Other Ambulatory Visit: Payer: Self-pay

## 2023-05-14 ENCOUNTER — Other Ambulatory Visit (HOSPITAL_COMMUNITY): Payer: Self-pay

## 2023-05-18 ENCOUNTER — Ambulatory Visit (INDEPENDENT_AMBULATORY_CARE_PROVIDER_SITE_OTHER): Admitting: Urology

## 2023-05-18 VITALS — BP 152/84 | HR 82

## 2023-05-18 DIAGNOSIS — Z8551 Personal history of malignant neoplasm of bladder: Secondary | ICD-10-CM | POA: Diagnosis not present

## 2023-05-18 DIAGNOSIS — R972 Elevated prostate specific antigen [PSA]: Secondary | ICD-10-CM | POA: Diagnosis not present

## 2023-05-18 DIAGNOSIS — C678 Malignant neoplasm of overlapping sites of bladder: Secondary | ICD-10-CM | POA: Diagnosis not present

## 2023-05-18 LAB — URINALYSIS, ROUTINE W REFLEX MICROSCOPIC
Bilirubin, UA: NEGATIVE
Ketones, UA: NEGATIVE
Nitrite, UA: NEGATIVE
RBC, UA: NEGATIVE
Specific Gravity, UA: 1.03 (ref 1.005–1.030)
Urobilinogen, Ur: 1 mg/dL (ref 0.2–1.0)
pH, UA: 6 (ref 5.0–7.5)

## 2023-05-18 LAB — MICROSCOPIC EXAMINATION: Bacteria, UA: NONE SEEN

## 2023-05-18 MED ORDER — CIPROFLOXACIN HCL 500 MG PO TABS
500.0000 mg | ORAL_TABLET | Freq: Once | ORAL | Status: AC
  Administered 2023-05-18: 500 mg via ORAL

## 2023-05-18 NOTE — Progress Notes (Signed)
 [place]  05/18/23  CC: No chief complaint on file.   HPI:  F/u -   1) bladder cancer-patient diagnosed Nov 2022 with high-grade T2 right bladder, right bladder neck and left high-grade CIS, and right bladder and superior high-grade T1 disease. In Jan 2023, repeat/max TUR with large patches on the bladder neck, right bladder wall, superior, left bladder wall with residual disease.  From Jan - Mar 2023 he underwent radiation+chemo to preserve bladder.    Primary treatment: Jan - Mar 2023 - max TUR+radiation+chemo   Biopsy/TUR: November 2022-TURBT - HG T2 right, right BN HG CIS, right wall HG T1, superior HG T1, left bladder wall HG CIS Jan 2023 - HGT1, CIS    Staging: Aug 2022 CT abd and pelvis 08/22 - a 2.5 cm right posterior bladder mass. No LAD or bone lesions.  Jul 2023 CT C/A/P - no evidence of disease  Oct 2023 CT C/A/P - NED  Nov 2024 CT C/A/P - NED    Surveillance (NCCN):  CTU or MRU (image upper tracts + axial imaging of abdomen/pelvis) every 3-6 mo x 2 years and consider annually   CT chest (preferred) or chest x-ray every 3-6 mo x 2 years and then annually    2) freq, urgency - AUASS = 14. Urgency and nocturia. On tamsulosin and oxybutynin. He had a sleep study pending from pulmonology.  He has obesity. His Cr was 1.01 Nov 2020 and 1.92 in Apr 2023. AuASS = 2. His blood sugar was high and his Cr was as high as 2.06 Apr 2021 and 1.92 in Apr 2023. Cr 1.57 in Nov 2023. On insulin in 2024.   3) PSA elevation - His 2023 PSA was 6.9, Dec 2024 PSA 4.5. No biopsy. Small prostate on imaging.    He returns today in management of bladder cancer for cystoscopy and a new issue - PSA elevation. He did not f/u for cysto Nov 2024. He is well with no dysuria or gross hematuria.   He has a history of A. fib and an atrial thrombus and is on Eliquis. 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    There were no vitals taken for this visit. NED. A&Ox3.   No respiratory distress    Abd soft, NT, ND Normal phallus with bilateral descended testicles  Cystoscopy Procedure Note  Patient identification was confirmed, informed consent was obtained, and patient was prepped using Betadine solution.  Lidocaine jelly was administered per urethral meatus.     Pre-Procedure: - Inspection reveals a normal caliber ureteral meatus.  Procedure: The flexible cystoscope was introduced without difficulty - No urethral strictures/lesions are present. - obstructive prostate moderate hyperplasia, no median lobe  - normal bladder neck - Bilateral ureteral orifices identified - Bladder mucosa  reveals no ulcers, tumors, or lesions but has some neovascularity especially anterior. No focal lesion. - No bladder stones - No trabeculation  Retroflexion shows normal bladder and bladder neck   Post-Procedure: - Patient tolerated the procedure well  Assessment/ Plan:  1) h/o bladder cancer - cysto reassuring. Send urine for cytology. CT scan.   2) PSA elevation - I had a long discussion with the patient on the nature of elevated PSA - benign vs malignant causes. We discussed age specific levels and that PCa can be seen on a biopsy with very low PSA levels (<=2.5). We discussed the nature risks and benefits of continued surveillance, other lab tests, imaging as well as prostate biopsy. We discussed the management of prostate  cancer might include active surveillance or treatment depending on biopsy findings. All questions answered. His PSA is lower and imaging benign. Will recheck PSA in 3 months.     No follow-ups on file.  Jerilee Field, MD

## 2023-05-19 LAB — CYTOLOGY, URINE

## 2023-06-01 ENCOUNTER — Other Ambulatory Visit: Payer: Self-pay

## 2023-06-08 ENCOUNTER — Ambulatory Visit (HOSPITAL_COMMUNITY)

## 2023-06-08 ENCOUNTER — Ambulatory Visit (HOSPITAL_COMMUNITY)
Admission: RE | Admit: 2023-06-08 | Discharge: 2023-06-08 | Disposition: A | Discharge status: Home / Self Care | Source: Ambulatory Visit | Attending: Urology | Admitting: Urology

## 2023-06-08 ENCOUNTER — Encounter (HOSPITAL_COMMUNITY): Payer: Self-pay

## 2023-06-08 DIAGNOSIS — Z8551 Personal history of malignant neoplasm of bladder: Secondary | ICD-10-CM | POA: Insufficient documentation

## 2023-06-08 DIAGNOSIS — K8070 Calculus of gallbladder and bile duct without cholecystitis without obstruction: Secondary | ICD-10-CM | POA: Diagnosis not present

## 2023-06-08 DIAGNOSIS — N3090 Cystitis, unspecified without hematuria: Secondary | ICD-10-CM | POA: Diagnosis not present

## 2023-06-08 DIAGNOSIS — C679 Malignant neoplasm of bladder, unspecified: Secondary | ICD-10-CM | POA: Insufficient documentation

## 2023-06-09 ENCOUNTER — Other Ambulatory Visit: Payer: Self-pay

## 2023-06-15 ENCOUNTER — Other Ambulatory Visit: Payer: Self-pay

## 2023-06-22 ENCOUNTER — Other Ambulatory Visit (HOSPITAL_COMMUNITY): Payer: Self-pay

## 2023-06-29 ENCOUNTER — Other Ambulatory Visit: Payer: Self-pay

## 2023-07-02 ENCOUNTER — Other Ambulatory Visit: Payer: Self-pay | Admitting: Family Medicine

## 2023-07-02 ENCOUNTER — Other Ambulatory Visit (HOSPITAL_COMMUNITY): Payer: Self-pay

## 2023-07-02 MED ORDER — LANTUS SOLOSTAR 100 UNIT/ML ~~LOC~~ SOPN
20.0000 [IU] | PEN_INJECTOR | SUBCUTANEOUS | 3 refills | Status: AC
  Filled 2023-07-02 – 2023-09-22 (×3): qty 15, 75d supply, fill #0
  Filled 2023-12-08: qty 15, 75d supply, fill #1
  Filled 2024-02-25: qty 15, 75d supply, fill #2

## 2023-07-03 ENCOUNTER — Encounter (HOSPITAL_COMMUNITY): Payer: Self-pay

## 2023-07-03 ENCOUNTER — Other Ambulatory Visit: Payer: Self-pay

## 2023-07-03 ENCOUNTER — Inpatient Hospital Stay (HOSPITAL_COMMUNITY)
Admission: EM | Admit: 2023-07-03 | Discharge: 2023-07-08 | DRG: 418 | Disposition: A | Discharge status: Home / Self Care | Attending: Family Medicine | Admitting: Family Medicine

## 2023-07-03 ENCOUNTER — Ambulatory Visit: Payer: Self-pay

## 2023-07-03 ENCOUNTER — Emergency Department (HOSPITAL_COMMUNITY)

## 2023-07-03 ENCOUNTER — Inpatient Hospital Stay (HOSPITAL_COMMUNITY)

## 2023-07-03 DIAGNOSIS — R932 Abnormal findings on diagnostic imaging of liver and biliary tract: Secondary | ICD-10-CM | POA: Diagnosis not present

## 2023-07-03 DIAGNOSIS — Z8249 Family history of ischemic heart disease and other diseases of the circulatory system: Secondary | ICD-10-CM | POA: Diagnosis not present

## 2023-07-03 DIAGNOSIS — Z91148 Patient's other noncompliance with medication regimen for other reason: Secondary | ICD-10-CM

## 2023-07-03 DIAGNOSIS — E119 Type 2 diabetes mellitus without complications: Secondary | ICD-10-CM

## 2023-07-03 DIAGNOSIS — E1122 Type 2 diabetes mellitus with diabetic chronic kidney disease: Secondary | ICD-10-CM | POA: Diagnosis present

## 2023-07-03 DIAGNOSIS — I428 Other cardiomyopathies: Secondary | ICD-10-CM | POA: Diagnosis present

## 2023-07-03 DIAGNOSIS — K2950 Unspecified chronic gastritis without bleeding: Secondary | ICD-10-CM | POA: Diagnosis not present

## 2023-07-03 DIAGNOSIS — E1165 Type 2 diabetes mellitus with hyperglycemia: Secondary | ICD-10-CM | POA: Diagnosis present

## 2023-07-03 DIAGNOSIS — Z7985 Long-term (current) use of injectable non-insulin antidiabetic drugs: Secondary | ICD-10-CM

## 2023-07-03 DIAGNOSIS — R7989 Other specified abnormal findings of blood chemistry: Secondary | ICD-10-CM

## 2023-07-03 DIAGNOSIS — I482 Chronic atrial fibrillation, unspecified: Secondary | ICD-10-CM

## 2023-07-03 DIAGNOSIS — Z9079 Acquired absence of other genital organ(s): Secondary | ICD-10-CM

## 2023-07-03 DIAGNOSIS — N1832 Chronic kidney disease, stage 3b: Secondary | ICD-10-CM

## 2023-07-03 DIAGNOSIS — D72829 Elevated white blood cell count, unspecified: Secondary | ICD-10-CM | POA: Diagnosis not present

## 2023-07-03 DIAGNOSIS — K5900 Constipation, unspecified: Secondary | ICD-10-CM | POA: Diagnosis not present

## 2023-07-03 DIAGNOSIS — Z7901 Long term (current) use of anticoagulants: Secondary | ICD-10-CM | POA: Diagnosis not present

## 2023-07-03 DIAGNOSIS — K810 Acute cholecystitis: Secondary | ICD-10-CM | POA: Diagnosis not present

## 2023-07-03 DIAGNOSIS — K760 Fatty (change of) liver, not elsewhere classified: Secondary | ICD-10-CM | POA: Diagnosis present

## 2023-07-03 DIAGNOSIS — C672 Malignant neoplasm of lateral wall of bladder: Secondary | ICD-10-CM | POA: Diagnosis present

## 2023-07-03 DIAGNOSIS — I509 Heart failure, unspecified: Secondary | ICD-10-CM | POA: Diagnosis not present

## 2023-07-03 DIAGNOSIS — R945 Abnormal results of liver function studies: Secondary | ICD-10-CM | POA: Diagnosis not present

## 2023-07-03 DIAGNOSIS — K831 Obstruction of bile duct: Secondary | ICD-10-CM | POA: Diagnosis not present

## 2023-07-03 DIAGNOSIS — I5032 Chronic diastolic (congestive) heart failure: Secondary | ICD-10-CM | POA: Diagnosis not present

## 2023-07-03 DIAGNOSIS — E871 Hypo-osmolality and hyponatremia: Secondary | ICD-10-CM | POA: Diagnosis not present

## 2023-07-03 DIAGNOSIS — E876 Hypokalemia: Secondary | ICD-10-CM | POA: Diagnosis present

## 2023-07-03 DIAGNOSIS — I4821 Permanent atrial fibrillation: Secondary | ICD-10-CM | POA: Diagnosis present

## 2023-07-03 DIAGNOSIS — Z9049 Acquired absence of other specified parts of digestive tract: Secondary | ICD-10-CM | POA: Diagnosis present

## 2023-07-03 DIAGNOSIS — K828 Other specified diseases of gallbladder: Secondary | ICD-10-CM | POA: Diagnosis not present

## 2023-07-03 DIAGNOSIS — G4733 Obstructive sleep apnea (adult) (pediatric): Secondary | ICD-10-CM | POA: Diagnosis not present

## 2023-07-03 DIAGNOSIS — E66813 Obesity, class 3: Secondary | ICD-10-CM | POA: Diagnosis not present

## 2023-07-03 DIAGNOSIS — E278 Other specified disorders of adrenal gland: Secondary | ICD-10-CM | POA: Diagnosis not present

## 2023-07-03 DIAGNOSIS — C679 Malignant neoplasm of bladder, unspecified: Secondary | ICD-10-CM | POA: Diagnosis not present

## 2023-07-03 DIAGNOSIS — Z8551 Personal history of malignant neoplasm of bladder: Secondary | ICD-10-CM | POA: Diagnosis not present

## 2023-07-03 DIAGNOSIS — Z7984 Long term (current) use of oral hypoglycemic drugs: Secondary | ICD-10-CM

## 2023-07-03 DIAGNOSIS — Z6830 Body mass index (BMI) 30.0-30.9, adult: Secondary | ICD-10-CM | POA: Diagnosis not present

## 2023-07-03 DIAGNOSIS — E1100 Type 2 diabetes mellitus with hyperosmolarity without nonketotic hyperglycemic-hyperosmolar coma (NKHHC): Secondary | ICD-10-CM

## 2023-07-03 DIAGNOSIS — E785 Hyperlipidemia, unspecified: Secondary | ICD-10-CM | POA: Diagnosis present

## 2023-07-03 DIAGNOSIS — Z01810 Encounter for preprocedural cardiovascular examination: Secondary | ICD-10-CM | POA: Diagnosis not present

## 2023-07-03 DIAGNOSIS — K8063 Calculus of gallbladder and bile duct with acute cholecystitis with obstruction: Secondary | ICD-10-CM | POA: Diagnosis not present

## 2023-07-03 DIAGNOSIS — Z79899 Other long term (current) drug therapy: Secondary | ICD-10-CM | POA: Diagnosis not present

## 2023-07-03 DIAGNOSIS — K8020 Calculus of gallbladder without cholecystitis without obstruction: Secondary | ICD-10-CM | POA: Diagnosis not present

## 2023-07-03 DIAGNOSIS — Z794 Long term (current) use of insulin: Secondary | ICD-10-CM

## 2023-07-03 DIAGNOSIS — I5042 Chronic combined systolic (congestive) and diastolic (congestive) heart failure: Secondary | ICD-10-CM | POA: Diagnosis not present

## 2023-07-03 DIAGNOSIS — K8070 Calculus of gallbladder and bile duct without cholecystitis without obstruction: Secondary | ICD-10-CM | POA: Diagnosis not present

## 2023-07-03 DIAGNOSIS — R1013 Epigastric pain: Secondary | ICD-10-CM | POA: Diagnosis present

## 2023-07-03 DIAGNOSIS — Z5986 Financial insecurity: Secondary | ICD-10-CM

## 2023-07-03 DIAGNOSIS — K8042 Calculus of bile duct with acute cholecystitis without obstruction: Secondary | ICD-10-CM | POA: Diagnosis not present

## 2023-07-03 DIAGNOSIS — I5020 Unspecified systolic (congestive) heart failure: Secondary | ICD-10-CM

## 2023-07-03 DIAGNOSIS — N1831 Chronic kidney disease, stage 3a: Secondary | ICD-10-CM | POA: Diagnosis present

## 2023-07-03 DIAGNOSIS — I130 Hypertensive heart and chronic kidney disease with heart failure and stage 1 through stage 4 chronic kidney disease, or unspecified chronic kidney disease: Secondary | ICD-10-CM | POA: Diagnosis not present

## 2023-07-03 DIAGNOSIS — I4891 Unspecified atrial fibrillation: Secondary | ICD-10-CM | POA: Diagnosis not present

## 2023-07-03 DIAGNOSIS — B9681 Helicobacter pylori [H. pylori] as the cause of diseases classified elsewhere: Secondary | ICD-10-CM | POA: Diagnosis not present

## 2023-07-03 DIAGNOSIS — K2980 Duodenitis without bleeding: Secondary | ICD-10-CM | POA: Diagnosis present

## 2023-07-03 DIAGNOSIS — Z833 Family history of diabetes mellitus: Secondary | ICD-10-CM

## 2023-07-03 DIAGNOSIS — I472 Ventricular tachycardia, unspecified: Secondary | ICD-10-CM | POA: Diagnosis present

## 2023-07-03 DIAGNOSIS — Z5941 Food insecurity: Secondary | ICD-10-CM

## 2023-07-03 DIAGNOSIS — K8046 Calculus of bile duct with acute and chronic cholecystitis without obstruction: Secondary | ICD-10-CM | POA: Diagnosis not present

## 2023-07-03 DIAGNOSIS — K2990 Gastroduodenitis, unspecified, without bleeding: Secondary | ICD-10-CM | POA: Diagnosis not present

## 2023-07-03 DIAGNOSIS — K7689 Other specified diseases of liver: Secondary | ICD-10-CM | POA: Diagnosis not present

## 2023-07-03 DIAGNOSIS — K838 Other specified diseases of biliary tract: Secondary | ICD-10-CM | POA: Diagnosis not present

## 2023-07-03 DIAGNOSIS — K8051 Calculus of bile duct without cholangitis or cholecystitis with obstruction: Secondary | ICD-10-CM | POA: Diagnosis not present

## 2023-07-03 DIAGNOSIS — E66811 Obesity, class 1: Secondary | ICD-10-CM | POA: Diagnosis present

## 2023-07-03 DIAGNOSIS — N281 Cyst of kidney, acquired: Secondary | ICD-10-CM | POA: Diagnosis not present

## 2023-07-03 DIAGNOSIS — K8050 Calculus of bile duct without cholangitis or cholecystitis without obstruction: Secondary | ICD-10-CM | POA: Diagnosis not present

## 2023-07-03 DIAGNOSIS — N400 Enlarged prostate without lower urinary tract symptoms: Secondary | ICD-10-CM | POA: Diagnosis present

## 2023-07-03 DIAGNOSIS — K8000 Calculus of gallbladder with acute cholecystitis without obstruction: Secondary | ICD-10-CM | POA: Diagnosis not present

## 2023-07-03 DIAGNOSIS — I493 Ventricular premature depolarization: Secondary | ICD-10-CM | POA: Diagnosis not present

## 2023-07-03 DIAGNOSIS — K2970 Gastritis, unspecified, without bleeding: Secondary | ICD-10-CM

## 2023-07-03 DIAGNOSIS — K3189 Other diseases of stomach and duodenum: Secondary | ICD-10-CM | POA: Diagnosis not present

## 2023-07-03 LAB — COMPREHENSIVE METABOLIC PANEL WITH GFR
ALT: 215 U/L — ABNORMAL HIGH (ref 0–44)
AST: 142 U/L — ABNORMAL HIGH (ref 15–41)
Albumin: 3.5 g/dL (ref 3.5–5.0)
Alkaline Phosphatase: 188 U/L — ABNORMAL HIGH (ref 38–126)
Anion gap: 12 (ref 5–15)
BUN: 14 mg/dL (ref 8–23)
CO2: 21 mmol/L — ABNORMAL LOW (ref 22–32)
Calcium: 9.2 mg/dL (ref 8.9–10.3)
Chloride: 100 mmol/L (ref 98–111)
Creatinine, Ser: 1.36 mg/dL — ABNORMAL HIGH (ref 0.61–1.24)
GFR, Estimated: 56 mL/min — ABNORMAL LOW (ref >60)
Glucose, Bld: 185 mg/dL — ABNORMAL HIGH (ref 70–99)
Potassium: 3.3 mmol/L — ABNORMAL LOW (ref 3.5–5.1)
Sodium: 133 mmol/L — ABNORMAL LOW (ref 135–145)
Total Bilirubin: 8.4 mg/dL — ABNORMAL HIGH (ref 0.0–1.2)
Total Protein: 7.2 g/dL (ref 6.5–8.1)

## 2023-07-03 LAB — CBC
HCT: 46.5 % (ref 39.0–52.0)
Hemoglobin: 15.9 g/dL (ref 13.0–17.0)
MCH: 29.2 pg (ref 26.0–34.0)
MCHC: 34.2 g/dL (ref 30.0–36.0)
MCV: 85.5 fL (ref 80.0–100.0)
Platelets: 179 10*3/uL (ref 150–400)
RBC: 5.44 MIL/uL (ref 4.22–5.81)
RDW: 12.9 % (ref 11.5–15.5)
WBC: 15.4 10*3/uL — ABNORMAL HIGH (ref 4.0–10.5)
nRBC: 0 % (ref 0.0–0.2)

## 2023-07-03 LAB — URINALYSIS, ROUTINE W REFLEX MICROSCOPIC
Glucose, UA: >=500 mg/dL — AB
Hgb urine dipstick: NEGATIVE
Ketones, ur: 5 mg/dL — AB
Nitrite: NEGATIVE
Protein, ur: 30 mg/dL — AB
Specific Gravity, Urine: 1.023 (ref 1.005–1.030)
pH: 5 (ref 5.0–8.0)

## 2023-07-03 LAB — LIPASE, BLOOD: Lipase: 34 U/L (ref 11–51)

## 2023-07-03 LAB — GLUCOSE, CAPILLARY
Glucose-Capillary: 125 mg/dL — ABNORMAL HIGH (ref 70–99)
Glucose-Capillary: 147 mg/dL — ABNORMAL HIGH (ref 70–99)

## 2023-07-03 LAB — HIV ANTIBODY (ROUTINE TESTING W REFLEX): HIV Screen 4th Generation wRfx: NONREACTIVE

## 2023-07-03 MED ORDER — INSULIN GLARGINE-YFGN 100 UNIT/ML ~~LOC~~ SOLN
10.0000 [IU] | Freq: Every day | SUBCUTANEOUS | Status: DC
  Administered 2023-07-04 – 2023-07-06 (×3): 10 [IU] via SUBCUTANEOUS
  Filled 2023-07-03 (×4): qty 0.1

## 2023-07-03 MED ORDER — POTASSIUM CHLORIDE 10 MEQ/100ML IV SOLN
10.0000 meq | INTRAVENOUS | Status: AC
  Administered 2023-07-03 (×2): 10 meq via INTRAVENOUS
  Filled 2023-07-03 (×2): qty 100

## 2023-07-03 MED ORDER — ONDANSETRON HCL 4 MG/2ML IJ SOLN
4.0000 mg | Freq: Once | INTRAMUSCULAR | Status: AC
  Administered 2023-07-03: 4 mg via INTRAVENOUS
  Filled 2023-07-03: qty 2

## 2023-07-03 MED ORDER — PIPERACILLIN-TAZOBACTAM 3.375 G IVPB
3.3750 g | Freq: Three times a day (TID) | INTRAVENOUS | Status: DC
  Administered 2023-07-03 – 2023-07-08 (×14): 3.375 g via INTRAVENOUS
  Filled 2023-07-03 (×14): qty 50

## 2023-07-03 MED ORDER — ACETAMINOPHEN 325 MG PO TABS
650.0000 mg | ORAL_TABLET | Freq: Four times a day (QID) | ORAL | Status: DC | PRN
  Administered 2023-07-04 – 2023-07-05 (×2): 650 mg via ORAL
  Filled 2023-07-03 (×2): qty 2

## 2023-07-03 MED ORDER — SODIUM CHLORIDE 0.9 % IV BOLUS
500.0000 mL | Freq: Once | INTRAVENOUS | Status: AC
  Administered 2023-07-03: 500 mL via INTRAVENOUS

## 2023-07-03 MED ORDER — SODIUM CHLORIDE 0.9% FLUSH
3.0000 mL | Freq: Two times a day (BID) | INTRAVENOUS | Status: DC
  Administered 2023-07-04 – 2023-07-06 (×3): 3 mL via INTRAVENOUS

## 2023-07-03 MED ORDER — PIPERACILLIN-TAZOBACTAM 3.375 G IVPB 30 MIN
3.3750 g | Freq: Once | INTRAVENOUS | Status: AC
  Administered 2023-07-03: 3.375 g via INTRAVENOUS
  Filled 2023-07-03: qty 50

## 2023-07-03 MED ORDER — GADOBUTROL 1 MMOL/ML IV SOLN
10.0000 mL | Freq: Once | INTRAVENOUS | Status: AC | PRN
  Administered 2023-07-03: 10 mL via INTRAVENOUS

## 2023-07-03 MED ORDER — LOPERAMIDE HCL 2 MG PO CAPS
2.0000 mg | ORAL_CAPSULE | Freq: Once | ORAL | Status: AC
  Administered 2023-07-03: 2 mg via ORAL
  Filled 2023-07-03: qty 1

## 2023-07-03 MED ORDER — ACETAMINOPHEN 650 MG RE SUPP: 650.0000 mg | Freq: Four times a day (QID) | RECTAL | Status: DC | PRN

## 2023-07-03 MED ORDER — INSULIN ASPART 100 UNIT/ML IJ SOLN
0.0000 [IU] | Freq: Three times a day (TID) | INTRAMUSCULAR | Status: DC
  Administered 2023-07-04: 5 [IU] via SUBCUTANEOUS
  Administered 2023-07-04 – 2023-07-06 (×2): 2 [IU] via SUBCUTANEOUS
  Administered 2023-07-06 – 2023-07-07 (×2): 5 [IU] via SUBCUTANEOUS
  Administered 2023-07-07: 3 [IU] via SUBCUTANEOUS
  Administered 2023-07-08: 5 [IU] via SUBCUTANEOUS

## 2023-07-03 MED ORDER — IOHEXOL 350 MG/ML SOLN
75.0000 mL | Freq: Once | INTRAVENOUS | Status: AC | PRN
  Administered 2023-07-03: 75 mL via INTRAVENOUS

## 2023-07-03 MED ORDER — SODIUM CHLORIDE 0.9 % IV SOLN: INTRAVENOUS | Status: AC

## 2023-07-03 NOTE — ED Provider Notes (Signed)
 Emergency Department Provider Note   I have reviewed the triage vital signs and the nursing notes.   HISTORY  Chief Complaint Abdominal Pain   HPI Darius [name] is a 68 y.o. male with a past history of diabetes and nonischemic cardiomyopathy along with A-fib (not taking anticoagulant) presents to the emergency department with abdominal pain.  He has had 3 days of diarrhea which have been nonbloody.  Denies recent travel or antibiotics.  He states after a loose bowel movement last night he began having moderate to severe abdominal pain.  Pain is central to left-sided.  Some of the pain has decreased this morning but still rates it 5 out of 10 in severity.  No pain into the chest.  No fevers or chills. No vomiting. Some nausea last night and this AM.    Past Medical History:  Diagnosis Date   Chronic combined systolic and diastolic heart failure (HCC)    followed by cardiologist;  a. Echo 03/27/11: EF 50-55%, inferior hypokinesis, moderate LAE, mild RVE, normal pulmonary pressures. ;   b.  TEE (04/03/11): EF 40-45%, mild MR moderate LAE, no LAA clot, mild RAE;  c.  Echocardiogram (02/2013): EF 60-65%, Gr 2 DD, mildly dilated Ao root (Ao root dimension 39 mm), MAC, mild LAE, normal RVF   Complication of anesthesia    pt unable to tolerate versed  and fentanyl  sedation and required general anesthesia with TEE 02/ 2013   Depression    Diabetes mellitus, type 2 (HCC)    followed by pcp  (02-21-2021 pt stated does not check blood sugar )   Hx of cardiovascular stress test    Lexiscan  Myoview (02/2013): No ischemia or scar, EF 51%, low risk;  cardiac cath 04-21-2016  no CAD, NICM   Malignant neoplasm of urinary bladder (HCC) 12/2020   urologist--- dr Derrick Fling;  oncologist-- dr Dirk Fredericks,  high grade muscle invasion   Morbid obesity (HCC)    NICM (nonischemic cardiomyopathy) (HCC) 04/2016   followed by cardiology;   dx 03/ 2018 per TEE ef 15%;  TEE 04/ 2018 ef 25%;  TEE 07/ 2018 ef 40%;  echo 12/  2021 ef 55%   OSA (obstructive sleep apnea)    followed by dr t. turner--  no cpap/ bipap:: previously followed  by dr clance sleep study 02-11-2013  very severe osa w/ AHI 108/hr had used cpap until 2017:   repeat sleep study 02-14-2018 in epic severe osa w/ hypoxemia, AHI 46.3/hr titrated 04-15-2018 used cpap for awhile   Persistent atrial fibrillation (HCC) 03/2011   cardiologist--   s/p TEE-DCCV (failed);  Amiodarone  started    Review of Systems  Constitutional: No fever/chills Cardiovascular: Denies chest pain. Respiratory: Denies shortness of breath. Gastrointestinal: Positive abdominal pain. Positive nausea, no vomiting. Positive diarrhea.  No constipation. Neurological: Negative for headaches.  ____________________________________________   PHYSICAL EXAM:  VITAL SIGNS: ED Triage Vitals  Encounter Vitals Group     BP 07/03/23 1110 (!) 153/98     Pulse Rate 07/03/23 1110 98     Resp 07/03/23 1110 20     Temp 07/03/23 1110 97.7 F (36.5 C)     Temp src --      SpO2 07/03/23 1110 97 %     Weight 07/03/23 1117 240 lb (108.9 kg)     Height 07/03/23 1117 6\' 2"  (1.88 m)   Constitutional: Alert and oriented. Well appearing and in no acute distress. Eyes: Conjunctivae are normal.  Head: Atraumatic. Nose: No congestion/rhinnorhea.  Mouth/Throat: Mucous membranes are moist.  Neck: No stridor.  Cardiovascular: Normal rate, regular rhythm. Good peripheral circulation. Grossly normal heart sounds.   Respiratory: Normal respiratory effort.  No retractions. Lungs CTAB. Gastrointestinal: Soft with mild diffuse tenderness. No focal tenderness. No distention.  Musculoskeletal: No gross deformities of extremities. Neurologic:  Normal speech and language. Skin:  Skin is warm, dry and intact. No rash noted.   ____________________________________________   LABS (all labs ordered are listed, but only abnormal results are displayed)  Labs Reviewed  URINE CULTURE - Abnormal; Notable  for the following components:      Result Value   Culture   (*)    Value: 10,000 COLONIES/mL STAPHYLOCOCCUS EPIDERMIDIS 10,000 COLONIES/mL STAPHYLOCOCCUS SAPROPHYTICUS CULTURE REINCUBATED FOR BETTER GROWTH Performed at [facility], [address], [place], [place] [address]    All other components within normal limits  COMPREHENSIVE METABOLIC PANEL WITH GFR - Abnormal; Notable for the following components:   Sodium 133 (*)    Potassium 3.3 (*)    CO2 21 (*)    Glucose, Bld 185 (*)    Creatinine, Ser 1.36 (*)    AST 142 (*)    ALT 215 (*)    Alkaline Phosphatase 188 (*)    Total Bilirubin 8.4 (*)    GFR, Estimated 56 (*)    All other components within normal limits  CBC - Abnormal; Notable for the following components:   WBC 15.4 (*)    All other components within normal limits  URINALYSIS, ROUTINE W REFLEX MICROSCOPIC - Abnormal; Notable for the following components:   Color, Urine AMBER (*)    APPearance HAZY (*)    Glucose, UA >=500 (*)    Bilirubin Urine MODERATE (*)    Ketones, ur 5 (*)    Protein, ur 30 (*)    Leukocytes,Ua SMALL (*)    Bacteria, UA FEW (*)    All other components within normal limits  COMPREHENSIVE METABOLIC PANEL WITH GFR - Abnormal; Notable for the following components:   Creatinine, Ser 1.35 (*)    Total Protein 6.3 (*)    Albumin 2.9 (*)    AST 80 (*)    ALT 165 (*)    Alkaline Phosphatase 170 (*)    Total Bilirubin 6.6 (*)    GFR, Estimated 57 (*)    All other components within normal limits  CBC - Abnormal; Notable for the following components:   WBC 12.4 (*)    All other components within normal limits  PROTIME-INR - Abnormal; Notable for the following components:   Prothrombin Time 15.4 (*)    All other components within normal limits  GLUCOSE, CAPILLARY - Abnormal; Notable for the following components:   Glucose-Capillary 125 (*)    All other components within normal limits  GLUCOSE, CAPILLARY - Abnormal; Notable for the  following components:   Glucose-Capillary 147 (*)    All other components within normal limits  GLUCOSE, CAPILLARY - Abnormal; Notable for the following components:   Glucose-Capillary 104 (*)    All other components within normal limits  GLUCOSE, CAPILLARY - Abnormal; Notable for the following components:   Glucose-Capillary 220 (*)    All other components within normal limits  GLUCOSE, CAPILLARY - Abnormal; Notable for the following components:   Glucose-Capillary 121 (*)    All other components within normal limits  CBC WITH DIFFERENTIAL/PLATELET - Abnormal; Notable for the following components:   WBC 13.1 (*)    Neutro Abs 11.2 (*)  Lymphs Abs 0.4 (*)    Monocytes Absolute 1.1 (*)    Abs Immature Granulocytes 0.08 (*)    All other components within normal limits  COMPREHENSIVE METABOLIC PANEL WITH GFR - Abnormal; Notable for the following components:   Sodium 134 (*)    Glucose, Bld 121 (*)    Creatinine, Ser 1.26 (*)    Calcium  8.5 (*)    Total Protein 6.3 (*)    Albumin 2.6 (*)    AST 44 (*)    ALT 112 (*)    Alkaline Phosphatase 181 (*)    Total Bilirubin 9.0 (*)    All other components within normal limits  GLUCOSE, CAPILLARY - Abnormal; Notable for the following components:   Glucose-Capillary 143 (*)    All other components within normal limits  GLUCOSE, CAPILLARY - Abnormal; Notable for the following components:   Glucose-Capillary 111 (*)    All other components within normal limits  CBC WITH DIFFERENTIAL/PLATELET - Abnormal; Notable for the following components:   WBC 13.1 (*)    All other components within normal limits  GLUCOSE, CAPILLARY - Abnormal; Notable for the following components:   Glucose-Capillary 152 (*)    All other components within normal limits  LIPASE, BLOOD  HIV ANTIBODY (ROUTINE TESTING W REFLEX)  GLUCOSE, CAPILLARY  COMPREHENSIVE METABOLIC PANEL WITH GFR   ____________________________________________   PROCEDURES  Procedure(s)  performed:   Procedures  None  ____________________________________________   INITIAL IMPRESSION / ASSESSMENT AND PLAN / ED COURSE  Pertinent labs & imaging results that were available during my care of the patient were reviewed by me and considered in my medical decision making (see chart for details).   This patient is Presenting for Evaluation of abdominal pain, which does require a range of treatment options, and is a complaint that involves a high risk of morbidity and mortality.  The Differential Diagnoses includes but is not exclusive to acute cholecystitis, intrathoracic causes for epigastric abdominal pain, gastritis, duodenitis, pancreatitis, small bowel or large bowel obstruction, abdominal aortic aneurysm, hernia, gastritis, etc.   Critical Interventions-    Medications  0.9 %  sodium chloride  infusion ( Intravenous New Bag/Given 07/03/23 2042)  sodium chloride  flush (NS) 0.9 % injection 3 mL (3 mLs Intravenous Given 07/06/23 0635)  acetaminophen  (TYLENOL ) tablet 650 mg (650 mg Oral Given 07/05/23 2042)    Or  acetaminophen  (TYLENOL ) suppository 650 mg ( Rectal See Alternative 07/05/23 2042)  piperacillin-tazobactam (ZOSYN) IVPB 3.375 g (3.375 g Intravenous New Bag/Given 07/06/23 0637)  insulin  aspart (novoLOG ) injection 0-15 Units ( Subcutaneous Not Given 07/05/23 1817)  insulin  glargine-yfgn (SEMGLEE ) injection 10 Units (10 Units Subcutaneous Given 07/05/23 0918)  metoprolol  tartrate (LOPRESSOR ) tablet 12.5 mg (12.5 mg Oral Given 07/05/23 2044)  diclofenac suppository 100 mg (has no administration in time range)  sodium chloride  0.9 % bolus 500 mL (0 mLs Intravenous Stopped 07/03/23 1428)  ondansetron  (ZOFRAN ) injection 4 mg (4 mg Intravenous Given 07/03/23 1227)  loperamide (IMODIUM) capsule 2 mg (2 mg Oral Given 07/03/23 1228)  iohexol  (OMNIPAQUE ) 350 MG/ML injection 75 mL (75 mLs Intravenous Contrast Given 07/03/23 1428)  piperacillin-tazobactam (ZOSYN) IVPB 3.375 g (0 g  Intravenous Stopped 07/03/23 1559)  potassium chloride  10 mEq in 100 mL IVPB (10 mEq Intravenous New Bag/Given 07/03/23 2041)  gadobutrol (GADAVIST) 1 MMOL/ML injection 10 mL (10 mLs Intravenous Contrast Given 07/03/23 1827)    Reassessment after intervention: pain symptoms improved.   Clinical Laboratory Tests Ordered, included CBC with leukocytosis. T bili is  elevated to 8.4. Mild increase in LFTs. UA equivocal without significant symptoms. Will send culture.   Radiologic Tests Ordered, included CT abdomen/pelvis. I independently interpreted the images and agree with radiology interpretation.   Cardiac Monitor Tracing which shows NSR.    Social Determinants of Health Risk patient is a non-smoker.   Consult complete with General Surgery. Request medicine admit and GI consultation. They will consult as well.   GI will consult.   TRH to admit.   Medical Decision Making: Summary:  Patient presents emergency department 3 days of diarrhea and abdominal pain starting last night.  No focal tenderness on abdominal exam.  Specifically negative Murphy sign.  Plan for CT abdomen pelvis.  He does have abnormalities in his bilirubin and LFTs.  Well-appearing.  No SIRS vitals at this time to strongly suggest sepsis or developing sepsis.  Reevaluation with update and discussion with patient. Discussed labs and imaging findings. Plan for admit and MRCP. GI and General surgery to follow.    Patient's presentation is most consistent with acute presentation with potential threat to life or bodily function.   Disposition: admit  ____________________________________________  FINAL CLINICAL IMPRESSION(S) / ED DIAGNOSES  Final diagnoses:  Cholecystitis, acute    Note:  This document was prepared using Dragon voice recognition software and may include unintentional dictation errors.  Abby Hocking, MD, [facility]    Emiliana Blaize, Shereen Dike, MD 07/06/23 [phone]

## 2023-07-03 NOTE — Consult Note (Signed)
 Darius [name] December 19, 1955  [id number].    Requesting MD: Dolan Freiberg, MD Chief Complaint/Reason for Consult: choledocholithiasis   HPI:  Darius [name] is a 68 y/o M with PMH afib, NICM, diabetes, and bladder cancer (s/p TURP) who presents with abdominal pain. States that 3 days ago he started having upper abdominal pain. Denies similar pain in the past. Associated symptoms include non-bloody diarrhea and poor PO intake. He denies fever, chills, sick contacts, or new medications.   He reports occasional EtOH use, denies tobacco use. He currently takes xarelto  for afib, last dose unsure but states is was at least a week ago, if not longer.   Currently no abdominal pain  Surgical history of TUR 2022 - Dx bladder CA, followed by TUR 2023 for residual high grade T2 bladder cancer (Dr. Derrick Fling). He underwent chemo/radiation   ROS: Review of Systems  All other systems reviewed and are negative.   Family History  Problem Relation Age of Onset   Diabetes Father        died in his 42s   Hypertension Father    Seizures Mother        died in her 29s   Hypertension Sister    Hypertension Paternal Grandfather    Heart attack Neg Hx    Stroke Neg Hx     Past Medical History:  Diagnosis Date   Chronic combined systolic and diastolic heart failure (HCC)    followed by cardiologist;  a. Echo 03/27/11: EF 50-55%, inferior hypokinesis, moderate LAE, mild RVE, normal pulmonary pressures. ;   b.  TEE (04/03/11): EF 40-45%, mild MR moderate LAE, no LAA clot, mild RAE;  c.  Echocardiogram (02/2013): EF 60-65%, Gr 2 DD, mildly dilated Ao root (Ao root dimension 39 mm), MAC, mild LAE, normal RVF   Complication of anesthesia    pt unable to tolerate versed  and fentanyl  sedation and required general anesthesia with TEE 02/ 2013   Depression    Diabetes mellitus, type 2 (HCC)    followed by pcp  (02-21-2021 pt stated does not check blood sugar )   Hx of cardiovascular stress test    Lexiscan  Myoview  (02/2013): No ischemia or scar, EF 51%, low risk;  cardiac cath 04-21-2016  no CAD, NICM   Malignant neoplasm of urinary bladder (HCC) 12/2020   urologist--- dr Derrick Fling;  oncologist-- dr Dirk Fredericks,  high grade muscle invasion   Morbid obesity (HCC)    NICM (nonischemic cardiomyopathy) (HCC) 04/2016   followed by cardiology;   dx 03/ 2018 per TEE ef 15%;  TEE 04/ 2018 ef 25%;  TEE 07/ 2018 ef 40%;  echo 12/ 2021 ef 55%   OSA (obstructive sleep apnea)    followed by dr t. turner--  no cpap/ bipap:: previously followed  by dr clance sleep study 02-11-2013  very severe osa w/ AHI 108/hr had used cpap until 2017:   repeat sleep study 02-14-2018 in epic severe osa w/ hypoxemia, AHI 46.3/hr titrated 04-15-2018 used cpap for awhile   Persistent atrial fibrillation (HCC) 03/2011   cardiologist--   s/p TEE-DCCV (failed);  Amiodarone  started    Past Surgical History:  Procedure Laterality Date   CARDIOVERSION  04/01/2011   Procedure: CARDIOVERSION;  Surgeon: Lenise Quince, MD;  Location: [facility] ENDOSCOPY;  Service: Cardiovascular;  Laterality: N/A;   CARDIOVERSION  04/03/2011   Procedure: CARDIOVERSION;  Surgeon: Lenise Quince, MD;  Location: [facility] ENDOSCOPY;  Service: Cardiovascular;  Laterality: N/A;   CARDIOVERSION  04/03/2011   Procedure: CARDIOVERSION;  Surgeon: Lenise Quince, MD;  Location: [facility] OR;  Service: Cardiovascular;  Laterality: N/A;   CARDIOVERSION N/A 06/15/2014   Procedure: CARDIOVERSION;  Surgeon: Darlis Eisenmenger, MD;  Location: [facility] ENDOSCOPY;  Service: Cardiovascular;  Laterality: N/A;   CARDIOVERSION N/A 09/08/2016   Procedure: CARDIOVERSION;  Surgeon: Darlis Eisenmenger, MD;  Location: [facility] ENDOSCOPY;  Service: Cardiovascular;  Laterality: N/A;   I & D EXTREMITY Right 09/19/2015   Procedure: IRRIGATION AND DEBRIDEMENT EXTREMITY;  Surgeon: Arnie Lao, MD;  Location: [facility] OR;  Service: Orthopedics;  Laterality: Right;   RIGHT/LEFT HEART CATH AND CORONARY ANGIOGRAPHY N/A 04/21/2016    Procedure: Right/Left Heart Cath and Coronary Angiography;  Surgeon: Darlis Eisenmenger, MD;  Location: [facility] INVASIVE CV LAB;  Service: Cardiovascular;  Laterality: N/A;   TEE WITHOUT CARDIOVERSION  04/01/2011   Procedure: TRANSESOPHAGEAL ECHOCARDIOGRAM (TEE);  Surgeon: Lenise Quince, MD;  Location: [facility] ENDOSCOPY;  Service: Cardiovascular;  Laterality: N/A;   TEE WITHOUT CARDIOVERSION  04/03/2011   Procedure: TRANSESOPHAGEAL ECHOCARDIOGRAM (TEE);  Surgeon: Lenise Quince, MD;  Location: [facility] ENDOSCOPY;  Service: Cardiovascular;  Laterality: N/A;   TEE WITHOUT CARDIOVERSION  04/03/2011   Procedure: TRANSESOPHAGEAL ECHOCARDIOGRAM (TEE);  Surgeon: Lenise Quince, MD;  Location: [facility] OR;  Service: Cardiovascular;  Laterality: N/A;   TEE WITHOUT CARDIOVERSION N/A 04/24/2016   Procedure: TRANSESOPHAGEAL ECHOCARDIOGRAM (TEE);  Surgeon: Darlis Eisenmenger, MD;  Location: [facility] ENDOSCOPY;  Service: Cardiovascular;  Laterality: N/A;   TEE WITHOUT CARDIOVERSION N/A 05/26/2016   Procedure: TRANSESOPHAGEAL ECHOCARDIOGRAM (TEE);  Surgeon: Darlis Eisenmenger, MD;  Location: [facility] ENDOSCOPY;  Service: Cardiovascular;  Laterality: N/A;   TEE WITHOUT CARDIOVERSION N/A 09/08/2016   Procedure: TRANSESOPHAGEAL ECHOCARDIOGRAM (TEE);  Surgeon: Darlis Eisenmenger, MD;  Location: [facility] ENDOSCOPY;  Service: Cardiovascular;  Laterality: N/A;   TRANSURETHRAL RESECTION OF BLADDER TUMOR N/A 12/14/2020   Procedure: TRANSURETHRAL RESECTION OF BLADDER TUMOR (TURBT) 2-5 cm/ POST OPERTIVE INSTILLATION OF GEMCITABINE ;  Surgeon: Christina Coyer, MD;  Location: [facility];  Service: Urology;  Laterality: N/A;   TRANSURETHRAL RESECTION OF BLADDER TUMOR N/A 02/22/2021   Procedure: TRANSURETHRAL RESECTION OF BLADDER TUMOR (TURBT);  Surgeon: Christina Coyer, MD;  Location: [facility];  Service: Urology;  Laterality: N/A;    Social History:  reports that he has never smoked. He has never used smokeless tobacco. He reports current  alcohol use. He reports that he does not currently use drugs.  Allergies: No Known Allergies  Medications Prior to Admission  Medication Sig Dispense Refill   insulin  glargine (LANTUS  SOLOSTAR) 100 UNIT/ML Solostar Pen Inject 20 Units into the skin every morning. 15 mL 3   Semaglutide ,0.25 or 0.5MG /DOS, 2 MG/3ML SOPN Inject 0.5 mg into the skin once a week. 3 mL 1   atorvastatin  (LIPITOR ) 80 MG tablet Take 1 tablet (80 mg total) by mouth every morning. (Patient not taking: Reported on 05/18/2023) 30 tablet 11   Blood Glucose Monitoring Suppl (BLOOD GLUCOSE MONITOR SYSTEM) w/Device KIT Test in the morning, at noon, and at bedtime. (Patient not taking: Reported on 05/18/2023) 1 kit 0   Blood Pressure Monitoring (BLOOD PRESSURE KIT) DEVI Check blood pressure daily (Patient not taking: Reported on 05/18/2023) 1 each 0   ezetimibe  (ZETIA ) 10 MG tablet Take 1 tablet (10 mg total) by mouth every morning. (Patient not taking: Reported on 05/18/2023) 30 tablet 11   metFORMIN  (GLUCOPHAGE ) 1000 MG tablet Take 1 tablet (1,000 mg total) by mouth every  morning. (Patient not taking: Reported on 07/03/2023) 30 tablet 11   oxybutynin  (DITROPAN ) 5 MG tablet Take 1 tablet (5 mg total) by mouth 2 (two) times daily as needed for bladder spasms. (Patient not taking: Reported on 05/18/2023) 30 tablet 3   rivaroxaban  (XARELTO ) 20 MG TABS tablet Take 1 tablet (20 mg total) by mouth every morning. (Patient not taking: Reported on 05/18/2023) 30 tablet 11   tamsulosin  (FLOMAX ) 0.4 MG CAPS capsule Take 1 capsule (0.4 mg total) by mouth daily after supper. (Patient not taking: Reported on 05/18/2023) 90 capsule 3     Physical Exam: Blood pressure (!) 146/86, pulse (!) 103, temperature 97.9 F (36.6 C), temperature source Oral, resp. rate 17, height 6\' 2"  (1.88 m), weight 108.9 kg, SpO2 98%. General: chronically ill appearing male in no acute distress HEENT: head -normocephalic, atraumatic; Eyes: scleral icterus present  Neck- Trachea  is midline CV- RRR, normal S1/S2, no M/R/G, no lower extremity edema  Pulm- breathing is non-labored on room air Abd- soft, not significantly tender nor distended no hernias, no organomegaly GU- deferred  MSK- UE/LE symmetrical, no cyanosis, clubbing, or edema. Neuro- non-focal exam Psych- Alert and Oriented x3 with appropriate affect Skin: warm and dry, no rashes or lesions   Results for orders placed or performed during the hospital encounter of 07/03/23 (from the past 48 hours)  Lipase, blood     Status: None   Collection Time: 07/03/23 11:19 AM  Result Value Ref Range   Lipase 34 11 - 51 U/L    Comment: Performed at [facility], [address], [place], [place] [address]  Comprehensive metabolic panel     Status: Abnormal   Collection Time: 07/03/23 11:19 AM  Result Value Ref Range   Sodium 133 (L) 135 - 145 mmol/L   Potassium 3.3 (L) 3.5 - 5.1 mmol/L   Chloride 100 98 - 111 mmol/L   CO2 21 (L) 22 - 32 mmol/L   Glucose, Bld 185 (H) 70 - 99 mg/dL    Comment: Glucose reference range applies only to samples taken after fasting for at least 8 hours.   BUN 14 8 - 23 mg/dL   Creatinine, Ser 6.04 (H) 0.61 - 1.24 mg/dL   Calcium  9.2 8.9 - 10.3 mg/dL   Total Protein 7.2 6.5 - 8.1 g/dL   Albumin 3.5 3.5 - 5.0 g/dL   AST 540 (H) 15 - 41 U/L   ALT 215 (H) 0 - 44 U/L   Alkaline Phosphatase 188 (H) 38 - 126 U/L   Total Bilirubin 8.4 (H) 0.0 - 1.2 mg/dL   GFR, Estimated 56 (L) >60 mL/min    Comment: (NOTE) Calculated using the CKD-EPI Creatinine Equation (2021)    Anion gap 12 5 - 15    Comment: Performed at [facility], [address], [place], [place] [address]  CBC     Status: Abnormal   Collection Time: 07/03/23 11:19 AM  Result Value Ref Range   WBC 15.4 (H) 4.0 - 10.5 K/uL   RBC 5.44 4.22 - 5.81 MIL/uL   Hemoglobin 15.9 13.0 - 17.0 g/dL   HCT 14.7 82.9 - 56.2 %   MCV 85.5 80.0 - 100.0 fL   MCH 29.2 26.0 - 34.0 pg   MCHC 34.2 30.0 - 36.0 g/dL   RDW 13.0 86.5  - 78.4 %   Platelets 179 150 - 400 K/uL   nRBC 0.0 0.0 - 0.2 %    Comment: Performed at  [facility], [address] [place], [place] [address]  Urinalysis, Routine w reflex microscopic -Urine, Clean Catch     Status: Abnormal   Collection Time: 07/03/23 11:19 AM  Result Value Ref Range   Color, Urine AMBER (A) YELLOW    Comment: BIOCHEMICALS MAY BE AFFECTED BY COLOR   APPearance HAZY (A) CLEAR   Specific Gravity, Urine 1.023 1.005 - 1.030   pH 5.0 5.0 - 8.0   Glucose, UA >=500 (A) NEGATIVE mg/dL   Hgb urine dipstick NEGATIVE NEGATIVE   Bilirubin Urine MODERATE (A) NEGATIVE   Ketones, ur 5 (A) NEGATIVE mg/dL   Protein, ur 30 (A) NEGATIVE mg/dL   Nitrite NEGATIVE NEGATIVE   Leukocytes,Ua SMALL (A) NEGATIVE   RBC / HPF 0-5 0 - 5 RBC/hpf   WBC, UA 11-20 0 - 5 WBC/hpf   Bacteria, UA FEW (A) NONE SEEN   Squamous Epithelial / HPF 0-5 0 - 5 /HPF   Mucus PRESENT     Comment: Performed at [facility], [address], [place], [place] [address]  Glucose, capillary     Status: Abnormal   Collection Time: 07/03/23  6:38 PM  Result Value Ref Range   Glucose-Capillary 125 (H) 70 - 99 mg/dL    Comment: Glucose reference range applies only to samples taken after fasting for at least 8 hours.  HIV Antibody (routine testing w rflx)     Status: None   Collection Time: 07/03/23  7:22 PM  Result Value Ref Range   HIV Screen 4th Generation wRfx Non Reactive Non Reactive    Comment: Performed at [facility], [address] [place], [place] [address]  Glucose, capillary     Status: Abnormal   Collection Time: 07/03/23 10:20 PM  Result Value Ref Range   Glucose-Capillary 147 (H) 70 - 99 mg/dL    Comment: Glucose reference range applies only to samples taken after fasting for at least 8 hours.  Comprehensive metabolic panel     Status: Abnormal   Collection Time: 07/04/23  3:04 AM  Result Value Ref Range   Sodium 135 135 - 145 mmol/L   Potassium 4.1 3.5 - 5.1 mmol/L    Chloride 100 98 - 111 mmol/L   CO2 25 22 - 32 mmol/L   Glucose, Bld 97 70 - 99 mg/dL    Comment: Glucose reference range applies only to samples taken after fasting for at least 8 hours.   BUN 12 8 - 23 mg/dL   Creatinine, Ser 5.78 (H) 0.61 - 1.24 mg/dL   Calcium  9.0 8.9 - 10.3 mg/dL   Total Protein 6.3 (L) 6.5 - 8.1 g/dL   Albumin 2.9 (L) 3.5 - 5.0 g/dL   AST 80 (H) 15 - 41 U/L   ALT 165 (H) 0 - 44 U/L   Alkaline Phosphatase 170 (H) 38 - 126 U/L   Total Bilirubin 6.6 (H) 0.0 - 1.2 mg/dL   GFR, Estimated 57 (L) >60 mL/min    Comment: (NOTE) Calculated using the CKD-EPI Creatinine Equation (2021)    Anion gap 10 5 - 15    Comment: Performed at [facility], [address], [place], [place] [address]  CBC     Status: Abnormal   Collection Time: 07/04/23  3:04 AM  Result Value Ref Range   WBC 12.4 (H) 4.0 - 10.5 K/uL   RBC 5.21 4.22 - 5.81 MIL/uL   Hemoglobin 15.2 13.0 - 17.0 g/dL  HCT 44.7 39.0 - 52.0 %   MCV 85.8 80.0 - 100.0 fL   MCH 29.2 26.0 - 34.0 pg   MCHC 34.0 30.0 - 36.0 g/dL   RDW 54.0 98.1 - 19.1 %   Platelets 167 150 - 400 K/uL   nRBC 0.0 0.0 - 0.2 %    Comment: Performed at [facility], [address], [place], [place] [address]  Protime-INR     Status: Abnormal   Collection Time: 07/04/23  3:04 AM  Result Value Ref Range   Prothrombin Time 15.4 (H) 11.4 - 15.2 seconds   INR 1.2 0.8 - 1.2    Comment: (NOTE) INR goal varies based on device and disease states. Performed at [facility], [address], [place], [place] [address]   Glucose, capillary     Status: Abnormal   Collection Time: 07/04/23  8:39 AM  Result Value Ref Range   Glucose-Capillary 104 (H) 70 - 99 mg/dL    Comment: Glucose reference range applies only to samples taken after fasting for at least 8 hours.   MR ABDOMEN MRCP W WO CONTAST Result Date: 07/04/2023 CLINICAL DATA:  Gallstones. Abdominal pain. History bladder cancer. EXAM: MRI ABDOMEN WITHOUT AND WITH CONTRAST  (INCLUDING MRCP) TECHNIQUE: Multiplanar multisequence MR imaging of the abdomen was performed both before and after the administration of intravenous contrast. Heavily T2-weighted images of the biliary and pancreatic ducts were obtained, and three-dimensional MRCP images were rendered by post processing. CONTRAST:  10mL GADAVIST GADOBUTROL 1 MMOL/ML IV SOLN COMPARISON:  CT AP 07/03/2023 FINDINGS: Lower chest: No acute abnormality. Hepatobiliary: Mild liver steatosis. Cyst within segment 4 B measures 2.4 cm, image 25/19. No enhancing liver lesions. Stones identified within the dependent portion of the gallbladder measuring up to 5.1 mm. Gallbladder wall thickening and edema measures up to 9 mm, image 13/4. The common bile duct measures 8 mm in maximum dimension. Multiple stones within the distal common bile duct are noted (at least 5). The largest of these measures 5 mm, image 19/4. Pancreas: No mass, inflammatory changes, or other parenchymal abnormality identified. Spleen: Normal in size without suspicious abnormality. Chronic areas of scarring within the splenic parenchyma identified Adrenals/Urinary Tract: Right adrenal gland is normal. Left adrenal nodule measures 1 cm. Small myelolipoma within the left adrenal gland measures 1.1 cm, image 30/4, image 16/8. No follow-up imaging recommended. No hydronephrosis. Mild left-sided perinephric soft tissue stranding. Upper pole left kidney cysts containing thin, internal area of hairline septation measures 2.8 cm and is compatible with a benign Bosniak class 2 cyst. No follow-up imaging recommended. Stomach/Bowel: The stomach appears normal. No pathologic dilatation of the large or small bowel loops. Vascular/Lymphatic: No pathologically enlarged lymph nodes identified. No abdominal aortic aneurysm demonstrated. Other:  No free fluid or fluid collections. Musculoskeletal: Choose 1 IMPRESSION: 1. Cholelithiasis with gallbladder wall thickening and edema compatible with  acute cholecystitis. 2. Multiple stones within the distal common bile duct (at least 5). The largest of these measures 5 mm. 3. Mild liver steatosis. 4. Small left adrenal myelolipoma. No follow-up imaging recommended. 5. Mild left-sided perinephric soft tissue stranding. Correlate for any clinical signs or symptoms of pyelonephritis. Electronically Signed   By: Kimberley Penman M.D.   On: 07/04/2023 05:24   CT ABDOMEN PELVIS W CONTRAST Result Date: 07/03/2023 CLINICAL DATA:  Abdominal pain, acute, nonlocalized. History of urinary bladder cancer. * Tracking Code: BO * EXAM: CT ABDOMEN AND PELVIS WITH CONTRAST TECHNIQUE: Multidetector CT imaging of the  abdomen and pelvis was performed using the standard protocol following bolus administration of intravenous contrast. RADIATION DOSE REDUCTION: This exam was performed according to the departmental dose-optimization program which includes automated exposure control, adjustment of the mA and/or kV according to patient size and/or use of iterative reconstruction technique. CONTRAST:  75mL OMNIPAQUE  IOHEXOL  350 MG/ML SOLN COMPARISON:  CT scan chest, abdomen and pelvis from 06/08/2023. FINDINGS: Lower chest: There are subpleural atelectatic changes in the visualized lung bases. No overt consolidation. No pleural effusion. The heart is normal in size. No pericardial effusion. Hepatobiliary: The liver is normal in size. Non-cirrhotic configuration. No suspicious mass. These is mild diffuse hepatic steatosis. There is a subcapsular simple cyst measuring 2.0 x 2.5 cm in the left hepatic lobe, segment 4A. The gallbladder is distended and exhibit mild diffuse gallbladder wall thickening and mild to moderate pericholecystic fat stranding. There are several, sub 5 mm calcified gallstones. No intrahepatic bile duct dilation. The extrahepatic bile duct is also not dilated however, there are at least 4, 2-5 mm sized calculi in the distal extrahepatic bile duct. Findings favor acute  cholecystitis and choledocholithiasis. Pancreas: Unremarkable. No pancreatic ductal dilatation or surrounding inflammatory changes. Spleen: Within normal limits. No focal lesion. 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 Unremarkable right adrenal gland. No suspicious renal mass. There is a 1.9 x 2.7 cm cyst arising from the left kidney upper pole which exhibit thin intervening partially calcified septum. No discrete mural nodule. No nephroureterolithiasis or obstructive uropathy on either side. Focal scarring noted in the right kidney lower pole, laterally. Urinary bladder is under distended, precluding optimal assessment. However, no large mass or stones identified. No perivesical fat stranding. Stomach/Bowel: No disproportionate dilation of the small or large bowel loops. No evidence of abnormal bowel wall thickening or inflammatory changes. The appendix is unremarkable. There is fecalization of several small bowel loops, which is nonspecific but can be seen with slow transit time/incompetent ileocecal valve. No bowel obstruction. Vascular/Lymphatic: No ascites or pneumoperitoneum. There is a stable oval 9 x 13 mm right retrocrural hypoattenuating lesion, which is tubular and favored to represent probable thrombosed vascular structure. No abdominal or pelvic lymphadenopathy, by size criteria. No aneurysmal dilation of the major abdominal arteries. There are mild peripheral atherosclerotic vascular calcifications of the aorta and its major branches. Reproductive: Normal size prostate. Symmetric seminal vesicles. Other: There are bilateral small fat containing inguinal hernias. The soft tissues and abdominal wall are otherwise unremarkable. Musculoskeletal: No suspicious osseous lesions. There are mild - moderate multilevel degenerative changes in the visualized spine. IMPRESSION: 1. Findings favor acute cholecystitis and choledocholithiasis. 2. No metastatic bladder carcinoma in  the abdomen or pelvis. 3. Multiple other nonacute observations, as described above. Aortic Atherosclerosis (ICD10-I70.0). Electronically Signed   By: Beula Brunswick M.D.   On: 07/03/2023 14:52       Latest Ref Rng & Units 07/04/2023    3:04 AM 07/03/2023   11:19 AM 02/18/2023   11:51 AM  Hepatic Function  Total Protein 6.5 - 8.1 g/dL 6.3  7.2  6.2   Albumin 3.5 - 5.0 g/dL 2.9  3.5  3.0   AST 15 - 41 U/L 80  142  14   ALT 0 - 44 U/L 165  215  15   Alk Phosphatase 38 - 126 U/L 170  188  94   Total Bilirubin 0.0 - 1.2 mg/dL 6.6  8.4  0.9   Bilirubin, Direct 0.0 - 0.2 mg/dL  0.2      Assessment/Plan Choledocholithiasis, likely calculous cholecystitis  - afebrile, HR 70's, BP 140 systolic, WBC 15.4 - MRCP + LFTs as above, consistent with choledocholithiasis - noted plans for ERCP 5/25  - Lipase 34 - CT abdomen/pelvis w/ cholelithiasis, gallbladder wall thickening and pericholecystic edema, multiple stones in distal CBD without biliary dilation.  - Agree with GI consult for ERCP - Cardiology for Darius for surgery - patient would benefit from cholecystectomy this admission once choledocholithiasis has been cleared.   FEN - diet per primary/ GI VTE - SCD's, hold Xarelto  per primary ID - Zosyn Admit - TRH service; GI consulting   I reviewed nursing notes, ED provider notes, Consultant GI notes, hospitalist notes, last 24 h vitals and pain scores, last 48 h intake and output, last 24 h labs and trends, and last 24 h imaging results.  I spent a total of 60 minutes today in both face-to-face and non-face-to-face activities to perform the following: review records, take and update history, examine the patient, counsel the patient on the diagnosis, and document encounter, findings, and plan in the EHR  for this visit on the date of this encounter.  Beatris Lincoln, MD [facility], A [facility]

## 2023-07-03 NOTE — H&P (Signed)
 History and Physical   REGINALDO HAZARD ZOX:[id number] DOB: 12/30/1955 DOA: 07/03/2023  PCP: Laneta Pintos, MD   Patient coming from: Home  Chief Complaint: Abdominal pain  HPI: Darius [name] is a 68 y.o. male with medical history significant of diabetes, CKD 3B, atrial fibrillation, CHF, OSA, obesity, bladder cancer presenting with abdominal pain.  Patient reports 3 days of nonbloody diarrhea.  He started having moderate to severe abdominal pain last night.  Reports some nausea this morning.  Denies fevers, chills, chest pain, shortness of breath, constipation, vomiting.  ED Course: Vital signs in the ED notable for blood pressure in the 140s-150s systolic.  Lab workup included CMP with sodium 133, potassium 3.3, bicarb 21, creatinine 1.36, glucose 185, AST 142, ALT 215, alk phos 188, T. bili 8.4.  CBC with leukocytosis to 15.4.  Lipase normal.  Urinalysis with glucose, bilirubin, ketones, protein, leukocytes, bacteria.  Urine culture pending.  CT abdomen pelvis showed changes consistent with acute cholecystitis and acute choledocholithiasis, no evidence of static disease in the setting of prior bladder cancer.  No other acute normality.  General surgery consulted and will see the patient and requested GI consultation who will also see the patient and recommending MRCP.  Review of Systems: As per HPI otherwise all other systems reviewed and are negative.  Past Medical History:  Diagnosis Date   Chronic combined systolic and diastolic heart failure (HCC)    followed by cardiologist;  a. Echo 03/27/11: EF 50-55%, inferior hypokinesis, moderate LAE, mild RVE, normal pulmonary pressures. ;   b.  TEE (04/03/11): EF 40-45%, mild MR moderate LAE, no LAA clot, mild RAE;  c.  Echocardiogram (02/2013): EF 60-65%, Gr 2 DD, mildly dilated Ao root (Ao root dimension 39 mm), MAC, mild LAE, normal RVF   Complication of anesthesia    pt unable to tolerate versed  and fentanyl  sedation and required general  anesthesia with TEE 02/ 2013   Depression    Diabetes mellitus, type 2 (HCC)    followed by pcp  (02-21-2021 pt stated does not check blood sugar )   Hx of cardiovascular stress test    Lexiscan  Myoview (02/2013): No ischemia or scar, EF 51%, low risk;  cardiac cath 04-21-2016  no CAD, NICM   Malignant neoplasm of urinary bladder (HCC) 12/2020   urologist--- dr Derrick Fling;  oncologist-- dr Dirk Fredericks,  high grade muscle invasion   Morbid obesity (HCC)    NICM (nonischemic cardiomyopathy) (HCC) 04/2016   followed by cardiology;   dx 03/ 2018 per TEE ef 15%;  TEE 04/ 2018 ef 25%;  TEE 07/ 2018 ef 40%;  echo 12/ 2021 ef 55%   OSA (obstructive sleep apnea)    followed by dr t. turner--  no cpap/ bipap:: previously followed  by dr clance sleep study 02-11-2013  very severe osa w/ AHI 108/hr had used cpap until 2017:   repeat sleep study 02-14-2018 in epic severe osa w/ hypoxemia, AHI 46.3/hr titrated 04-15-2018 used cpap for awhile   Persistent atrial fibrillation (HCC) 03/2011   cardiologist--   s/p TEE-DCCV (failed);  Amiodarone  started    Past Surgical History:  Procedure Laterality Date   CARDIOVERSION  04/01/2011   Procedure: CARDIOVERSION;  Surgeon: Lenise Quince, MD;  Location: [facility] ENDOSCOPY;  Service: Cardiovascular;  Laterality: N/A;   CARDIOVERSION  04/03/2011   Procedure: CARDIOVERSION;  Surgeon: Lenise Quince, MD;  Location: [facility] ENDOSCOPY;  Service: Cardiovascular;  Laterality: N/A;   CARDIOVERSION  04/03/2011   Procedure:  CARDIOVERSION;  Surgeon: Lenise Quince, MD;  Location: [facility] OR;  Service: Cardiovascular;  Laterality: N/A;   CARDIOVERSION N/A 06/15/2014   Procedure: CARDIOVERSION;  Surgeon: Darlis Eisenmenger, MD;  Location: [facility] ENDOSCOPY;  Service: Cardiovascular;  Laterality: N/A;   CARDIOVERSION N/A 09/08/2016   Procedure: CARDIOVERSION;  Surgeon: Darlis Eisenmenger, MD;  Location: [facility] ENDOSCOPY;  Service: Cardiovascular;  Laterality: N/A;   I & D EXTREMITY Right 09/19/2015   Procedure:  IRRIGATION AND DEBRIDEMENT EXTREMITY;  Surgeon: Arnie Lao, MD;  Location: [facility] OR;  Service: Orthopedics;  Laterality: Right;   RIGHT/LEFT HEART CATH AND CORONARY ANGIOGRAPHY N/A 04/21/2016   Procedure: Right/Left Heart Cath and Coronary Angiography;  Surgeon: Darlis Eisenmenger, MD;  Location: [facility] INVASIVE CV LAB;  Service: Cardiovascular;  Laterality: N/A;   TEE WITHOUT CARDIOVERSION  04/01/2011   Procedure: TRANSESOPHAGEAL ECHOCARDIOGRAM (TEE);  Surgeon: Lenise Quince, MD;  Location: [facility] ENDOSCOPY;  Service: Cardiovascular;  Laterality: N/A;   TEE WITHOUT CARDIOVERSION  04/03/2011   Procedure: TRANSESOPHAGEAL ECHOCARDIOGRAM (TEE);  Surgeon: Lenise Quince, MD;  Location: [facility] ENDOSCOPY;  Service: Cardiovascular;  Laterality: N/A;   TEE WITHOUT CARDIOVERSION  04/03/2011   Procedure: TRANSESOPHAGEAL ECHOCARDIOGRAM (TEE);  Surgeon: Lenise Quince, MD;  Location: [facility] OR;  Service: Cardiovascular;  Laterality: N/A;   TEE WITHOUT CARDIOVERSION N/A 04/24/2016   Procedure: TRANSESOPHAGEAL ECHOCARDIOGRAM (TEE);  Surgeon: Darlis Eisenmenger, MD;  Location: [facility] ENDOSCOPY;  Service: Cardiovascular;  Laterality: N/A;   TEE WITHOUT CARDIOVERSION N/A 05/26/2016   Procedure: TRANSESOPHAGEAL ECHOCARDIOGRAM (TEE);  Surgeon: Darlis Eisenmenger, MD;  Location: [facility] ENDOSCOPY;  Service: Cardiovascular;  Laterality: N/A;   TEE WITHOUT CARDIOVERSION N/A 09/08/2016   Procedure: TRANSESOPHAGEAL ECHOCARDIOGRAM (TEE);  Surgeon: Darlis Eisenmenger, MD;  Location: [facility] ENDOSCOPY;  Service: Cardiovascular;  Laterality: N/A;   TRANSURETHRAL RESECTION OF BLADDER TUMOR N/A 12/14/2020   Procedure: TRANSURETHRAL RESECTION OF BLADDER TUMOR (TURBT) 2-5 cm/ POST OPERTIVE INSTILLATION OF GEMCITABINE ;  Surgeon: Christina Coyer, MD;  Location: [facility];  Service: Urology;  Laterality: N/A;   TRANSURETHRAL RESECTION OF BLADDER TUMOR N/A 02/22/2021   Procedure: TRANSURETHRAL RESECTION OF BLADDER TUMOR (TURBT);  Surgeon: Christina Coyer, MD;  Location: [facility];  Service: Urology;  Laterality: N/A;    Social History  reports that he has never smoked. He has never used smokeless tobacco. He reports current alcohol use. He reports that he does not currently use drugs.  No Known Allergies  Family History  Problem Relation Age of Onset   Diabetes Father        died in his 44s   Hypertension Father    Seizures Mother        died in her 66s   Hypertension Sister    Hypertension Paternal Grandfather    Heart attack Neg Hx    Stroke Neg Hx   Reviewed on admission  Prior to Admission medications   Medication Sig Start Date End Date Taking? Authorizing Provider  insulin  glargine (LANTUS  SOLOSTAR) 100 UNIT/ML Solostar Pen Inject 20 Units into the skin every morning. 07/02/23  Yes Laneta Pintos, MD  Semaglutide ,0.25 or 0.5MG /DOS, 2 MG/3ML SOPN Inject 0.5 mg into the skin once a week. 05/25/23  Yes Laneta Pintos, MD  atorvastatin  (LIPITOR ) 80 MG tablet Take 1 tablet (80 mg total) by mouth every morning. Patient not taking: Reported on 05/18/2023 04/22/23   Laneta Pintos, MD  Blood Glucose Monitoring Suppl (BLOOD GLUCOSE MONITOR SYSTEM) w/Device KIT Test  in the morning, at noon, and at bedtime. Patient not taking: Reported on 05/18/2023 02/05/23   Laneta Pintos, MD  Blood Pressure Monitoring (BLOOD PRESSURE KIT) DEVI Check blood pressure daily Patient not taking: Reported on 05/18/2023 02/12/23   Laneta Pintos, MD  ezetimibe  (ZETIA ) 10 MG tablet Take 1 tablet (10 mg total) by mouth every morning. Patient not taking: Reported on 05/18/2023 04/22/23   Laneta Pintos, MD  metFORMIN  (GLUCOPHAGE ) 1000 MG tablet Take 1 tablet (1,000 mg total) by mouth every morning. Patient not taking: Reported on 07/03/2023 04/22/23   Laneta Pintos, MD  oxybutynin  (DITROPAN ) 5 MG tablet Take 1 tablet (5 mg total) by mouth 2 (two) times daily as needed for bladder spasms. Patient not taking: Reported on 05/18/2023 02/24/22    Christina Coyer, MD  rivaroxaban  (XARELTO ) 20 MG TABS tablet Take 1 tablet (20 mg total) by mouth every morning. Patient not taking: Reported on 05/18/2023 04/22/23   Laneta Pintos, MD  tamsulosin  (FLOMAX ) 0.4 MG CAPS capsule Take 1 capsule (0.4 mg total) by mouth daily after supper. Patient not taking: Reported on 05/18/2023 02/24/22   Christina Coyer, MD    Physical Exam: Vitals:   07/03/23 1230 07/03/23 1300 07/03/23 1502 07/03/23 1600  BP: (!) 152/102 (!) 146/99  (!) 140/97  Pulse: 66 76  79  Resp: 17 20  (!) 22  Temp:   97.7 F (36.5 C)   TempSrc:   Oral   SpO2: 97% 92%  94%  Weight:      Height:        Physical Exam Constitutional:      General: He is not in acute distress.    Appearance: Normal appearance. He is obese.  HENT:     Head: Normocephalic and atraumatic.     Mouth/Throat:     Mouth: Mucous membranes are moist.     Pharynx: Oropharynx is clear.  Eyes:     Extraocular Movements: Extraocular movements intact.     Pupils: Pupils are equal, round, and reactive to light.  Cardiovascular:     Rate and Rhythm: Normal rate. Rhythm irregular.     Pulses: Normal pulses.     Heart sounds: Normal heart sounds.  Pulmonary:     Effort: Pulmonary effort is normal. No respiratory distress.     Breath sounds: Normal breath sounds.  Abdominal:     General: Bowel sounds are normal. There is no distension.     Palpations: Abdomen is soft.     Tenderness: There is abdominal tenderness in the right upper quadrant.  Musculoskeletal:        General: No swelling or deformity.  Skin:    General: Skin is warm and dry.  Neurological:     General: No focal deficit present.     Mental Status: Mental status is at baseline.    Labs on Admission: I have personally reviewed following labs and imaging studies  CBC: Recent Labs  Lab 07/03/23 1119  WBC 15.4*  HGB 15.9  HCT 46.5  MCV 85.5  PLT 179    Basic Metabolic Panel: Recent Labs  Lab 07/03/23 1119  NA 133*  K  3.3*  CL 100  CO2 21*  GLUCOSE 185*  BUN 14  CREATININE 1.36*  CALCIUM  9.2    GFR: Estimated Creatinine Clearance: 67.4 mL/min (A) (by C-G formula based on SCr of 1.36 mg/dL (H)).  Liver Function Tests: Recent Labs  Lab 07/03/23 1119  AST 142*  ALT  215*  ALKPHOS 188*  BILITOT 8.4*  PROT 7.2  ALBUMIN 3.5    Urine analysis:    Component Value Date/Time   COLORURINE AMBER (A) 07/03/2023 1119   APPEARANCEUR HAZY (A) 07/03/2023 1119   APPEARANCEUR Clear 05/18/2023 1028   LABSPEC 1.023 07/03/2023 1119   PHURINE 5.0 07/03/2023 1119   GLUCOSEU >=500 (A) 07/03/2023 1119   HGBUR NEGATIVE 07/03/2023 1119   BILIRUBINUR MODERATE (A) 07/03/2023 1119   BILIRUBINUR Negative 05/18/2023 1028   KETONESUR 5 (A) 07/03/2023 1119   PROTEINUR 30 (A) 07/03/2023 1119   UROBILINOGEN 0.2 10/20/2019 1137   UROBILINOGEN 0.2 02/13/2017 1540   NITRITE NEGATIVE 07/03/2023 1119   LEUKOCYTESUR SMALL (A) 07/03/2023 1119    Radiological Exams on Admission: CT ABDOMEN PELVIS W CONTRAST Result Date: 07/03/2023 CLINICAL DATA:  Abdominal pain, acute, nonlocalized. History of urinary bladder cancer. * Tracking Code: BO * EXAM: CT ABDOMEN AND PELVIS WITH CONTRAST TECHNIQUE: Multidetector CT imaging of the abdomen and pelvis was performed using the standard protocol following bolus administration of intravenous contrast. RADIATION DOSE REDUCTION: This exam was performed according to the departmental dose-optimization program which includes automated exposure control, adjustment of the mA and/or kV according to patient size and/or use of iterative reconstruction technique. CONTRAST:  75mL OMNIPAQUE  IOHEXOL  350 MG/ML SOLN COMPARISON:  CT scan chest, abdomen and pelvis from 06/08/2023. FINDINGS: Lower chest: There are subpleural atelectatic changes in the visualized lung bases. No overt consolidation. No pleural effusion. The heart is normal in size. No pericardial effusion. Hepatobiliary: The liver is normal in  size. Non-cirrhotic configuration. No suspicious mass. These is mild diffuse hepatic steatosis. There is a subcapsular simple cyst measuring 2.0 x 2.5 cm in the left hepatic lobe, segment 4A. The gallbladder is distended and exhibit mild diffuse gallbladder wall thickening and mild to moderate pericholecystic fat stranding. There are several, sub 5 mm calcified gallstones. No intrahepatic bile duct dilation. The extrahepatic bile duct is also not dilated however, there are at least 4, 2-5 mm sized calculi in the distal extrahepatic bile duct. Findings favor acute cholecystitis and choledocholithiasis. Pancreas: Unremarkable. No pancreatic ductal dilatation or surrounding inflammatory changes. Spleen: Within normal limits. No focal lesion. 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 Unremarkable right adrenal gland. No suspicious renal mass. There is a 1.9 x 2.7 cm cyst arising from the left kidney upper pole which exhibit thin intervening partially calcified septum. No discrete mural nodule. No nephroureterolithiasis or obstructive uropathy on either side. Focal scarring noted in the right kidney lower pole, laterally. Urinary bladder is under distended, precluding optimal assessment. However, no large mass or stones identified. No perivesical fat stranding. Stomach/Bowel: No disproportionate dilation of the small or large bowel loops. No evidence of abnormal bowel wall thickening or inflammatory changes. The appendix is unremarkable. There is fecalization of several small bowel loops, which is nonspecific but can be seen with slow transit time/incompetent ileocecal valve. No bowel obstruction. Vascular/Lymphatic: No ascites or pneumoperitoneum. There is a stable oval 9 x 13 mm right retrocrural hypoattenuating lesion, which is tubular and favored to represent probable thrombosed vascular structure. No abdominal or pelvic lymphadenopathy, by size criteria. No aneurysmal dilation of  the major abdominal arteries. There are mild peripheral atherosclerotic vascular calcifications of the aorta and its major branches. Reproductive: Normal size prostate. Symmetric seminal vesicles. Other: There are bilateral small fat containing inguinal hernias. The soft tissues and abdominal wall are otherwise unremarkable. Musculoskeletal: No suspicious osseous lesions. There  are mild - moderate multilevel degenerative changes in the visualized spine. IMPRESSION: 1. Findings favor acute cholecystitis and choledocholithiasis. 2. No metastatic bladder carcinoma in the abdomen or pelvis. 3. Multiple other nonacute observations, as described above. Aortic Atherosclerosis (ICD10-I70.0). Electronically Signed   By: Beula Brunswick M.D.   On: 07/03/2023 14:52   EKG: Not performed in emergency department.  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 > CT showing evidence of acute cholecystitis and acute choledocholithiasis. > LFTs elevated with AST 142, ALT 215, alk phos 188, T. bili 8.4. > Leukocytosis to 15.4. > General Surgery and gastroenterology consulted.  Will see the patient, recommend MRCP. > Received Zosyn and fluids in the ED. - Monitor on telemetry overnight - Appreciate general surgery and gastroenterology recommendations and assistance - MRCP, n.p.o. for now - Continue with Zosyn - IV fluids - As needed pain medication - Supportive care  Diabetes > 20 units daily at home - 10 units daily - SSI  CKD 3B Hypokalemia > Creatinine stable 1.36 in the ED - 20 mEq IV  potassium - Trend renal function and electrolytes  Atrial fibrillation - Rate is controlled - Not currently taking home Xarelto   CHF with recovered ejection fraction > Last echo was 2023 with EF 50 of 80%, normal diastolic function, normal RV function. -Not currently taking any medications for this  History of bladder cancer - Noted  Obesity - Noted  OSA - Not on CPAP per chart   DVT prophylaxis: SCDs Code Status:   Full Family Communication:  None on admission  Disposition Plan:   Patient is from:  Home  Anticipated DC to:  Home  Anticipated DC date:  2 to 4 days  Anticipated DC barriers: None  Consults called:  Gastroenterology, general surgery Admission status:  Inpatient, telemetry  Severity of Illness: The appropriate patient status for this patient is INPATIENT. Inpatient status is judged to be reasonable and necessary in order to provide the required intensity of service to ensure the patient's safety. The patient's presenting symptoms, physical exam findings, and initial radiographic and laboratory data in the context of their chronic comorbidities is felt to place them at high risk for further clinical deterioration. Furthermore, it is not anticipated that the patient will be medically stable for discharge from the hospital within 2 midnights of admission.   * I certify that at the point of admission it is my clinical judgment that the patient will require inpatient hospital care spanning beyond 2 midnights from the point of admission due to high intensity of service, high risk for further deterioration and high frequency of surveillance required.Johnetta Nab MD [facility]  How to contact the [facility] Attending or Consulting provider 7A - 7P or covering provider during after hours 7P -7A, for this patient?   Check the care team in [facility] and look for a) attending/consulting [facility] provider listed and b) the [facility] team listed Log into [email or web address] and use [facility]'s universal password to access. If you do not have the password, please contact the hospital operator. Locate the [facility]  provider you are looking for under [facility] and page to a number that you can be directly reached. If you still have difficulty reaching the provider, please page the [facility] (Director on Call) for the Hospitalists listed on amion for assistance.  07/03/2023, 4:02 PM

## 2023-07-03 NOTE — ED Triage Notes (Signed)
 Pt here for abd pain 3 days ago. C/O diarrhea/ nausea. Denies CP.

## 2023-07-03 NOTE — Consult Note (Signed)
 Consultation  Referring Provider: ER MD C/Long Primary Care Physician:  Laneta Pintos, MD Primary Gastroenterologist:  unassigned  Reason for Consultation: Acute abdominal pain, probable choledocholithiasis  HPI: Darius [name] is a 68 y.o. male, with history of bladder cancer, diabetes mellitus, sleep apnea, atrial fibrillation and chronic combined heart failure with EF 50 to 55%. Patient says that he initially became ill about 3 days ago after he had eaten some tacos from [place] which he thought tasted bad.  Later that same day he developed diarrhea and says that he had bloody liquid stools over the next couple of days very frequently, no nausea or vomiting, no fever, no significant abdominal pain.  Yesterday the diarrhea seem to be resolving then late last night he developed what he describes as upper abdominal pain which became fairly diffuse and constant and lasted all night until he came to the ER today and had pain medication.  He says it was about a 5 out of 10 and steady hard pain.  He did have some nausea but no vomiting and again not aware of any fever or chills.  Workup in the ER with labs shows WBC 15.4/hemoglobin 15.9/hematocrit 46.5/MCV 85/platelets 179 Lipase within normal limits Sodium 133/potassium 3.3/BUN 14/creatinine 1.36 T. bili 8.4/alk phos 188/AST 142/ALT 215  CT abdomen and pelvis shows diffuse hepatic steatosis, normal pancreas, distended gallbladder with gallbladder wall thickening and mild pericholecystic fat stranding and several gallstones.  There is no definite ductal dilation but he appears to have at least four 2 to 5 mm stones in the distal common bile duct.  Patient denies any similar episodes of pain in the past.  He seems to be feeling a bit better this afternoon but has had IV pain medication. Reviewing his x-rays he also had CT of the chest abdomen and pelvis by his urologist on 06/08/2023 that confirmed calcified gallstones, and multiple small stones  within the distal common bile duct without biliary obstruction.  Note made of cystitis possibly related to radiation therapy no obvious bladder mass and no evidence for metastatic disease.  Is supposed to be on Xarelto  but says he has not been taking it.  Past Medical History:  Diagnosis Date   Chronic combined systolic and diastolic heart failure (HCC)    followed by cardiologist;  a. Echo 03/27/11: EF 50-55%, inferior hypokinesis, moderate LAE, mild RVE, normal pulmonary pressures. ;   b.  TEE (04/03/11): EF 40-45%, mild MR moderate LAE, no LAA clot, mild RAE;  c.  Echocardiogram (02/2013): EF 60-65%, Gr 2 DD, mildly dilated Ao root (Ao root dimension 39 mm), MAC, mild LAE, normal RVF   Complication of anesthesia    pt unable to tolerate versed  and fentanyl  sedation and required general anesthesia with TEE 02/ 2013   Depression    Diabetes mellitus, type 2 (HCC)    followed by pcp  (02-21-2021 pt stated does not check blood sugar )   Hx of cardiovascular stress test    Lexiscan  Myoview (02/2013): No ischemia or scar, EF 51%, low risk;  cardiac cath 04-21-2016  no CAD, NICM   Malignant neoplasm of urinary bladder (HCC) 12/2020   urologist--- dr Derrick Fling;  oncologist-- dr Dirk Fredericks,  high grade muscle invasion   Morbid obesity (HCC)    NICM (nonischemic cardiomyopathy) (HCC) 04/2016   followed by cardiology;   dx 03/ 2018 per TEE ef 15%;  TEE 04/ 2018 ef 25%;  TEE 07/ 2018 ef 40%;  echo 12/ 2021  ef 55%   OSA (obstructive sleep apnea)    followed by dr t. turner--  no cpap/ bipap:: previously followed  by dr clance sleep study 02-11-2013  very severe osa w/ AHI 108/hr had used cpap until 2017:   repeat sleep study 02-14-2018 in epic severe osa w/ hypoxemia, AHI 46.3/hr titrated 04-15-2018 used cpap for awhile   Persistent atrial fibrillation (HCC) 03/2011   cardiologist--   s/p TEE-DCCV (failed);  Amiodarone  started    Past Surgical History:  Procedure Laterality Date   CARDIOVERSION  04/01/2011    Procedure: CARDIOVERSION;  Surgeon: Lenise Quince, MD;  Location: [facility] ENDOSCOPY;  Service: Cardiovascular;  Laterality: N/A;   CARDIOVERSION  04/03/2011   Procedure: CARDIOVERSION;  Surgeon: Lenise Quince, MD;  Location: [facility] ENDOSCOPY;  Service: Cardiovascular;  Laterality: N/A;   CARDIOVERSION  04/03/2011   Procedure: CARDIOVERSION;  Surgeon: Lenise Quince, MD;  Location: [facility] OR;  Service: Cardiovascular;  Laterality: N/A;   CARDIOVERSION N/A 06/15/2014   Procedure: CARDIOVERSION;  Surgeon: Darlis Eisenmenger, MD;  Location: [facility] ENDOSCOPY;  Service: Cardiovascular;  Laterality: N/A;   CARDIOVERSION N/A 09/08/2016   Procedure: CARDIOVERSION;  Surgeon: Darlis Eisenmenger, MD;  Location: [facility] ENDOSCOPY;  Service: Cardiovascular;  Laterality: N/A;   I & D EXTREMITY Right 09/19/2015   Procedure: IRRIGATION AND DEBRIDEMENT EXTREMITY;  Surgeon: Arnie Lao, MD;  Location: [facility] OR;  Service: Orthopedics;  Laterality: Right;   RIGHT/LEFT HEART CATH AND CORONARY ANGIOGRAPHY N/A 04/21/2016   Procedure: Right/Left Heart Cath and Coronary Angiography;  Surgeon: Darlis Eisenmenger, MD;  Location: [facility] INVASIVE CV LAB;  Service: Cardiovascular;  Laterality: N/A;   TEE WITHOUT CARDIOVERSION  04/01/2011   Procedure: TRANSESOPHAGEAL ECHOCARDIOGRAM (TEE);  Surgeon: Lenise Quince, MD;  Location: [facility] ENDOSCOPY;  Service: Cardiovascular;  Laterality: N/A;   TEE WITHOUT CARDIOVERSION  04/03/2011   Procedure: TRANSESOPHAGEAL ECHOCARDIOGRAM (TEE);  Surgeon: Lenise Quince, MD;  Location: [facility] ENDOSCOPY;  Service: Cardiovascular;  Laterality: N/A;   TEE WITHOUT CARDIOVERSION  04/03/2011   Procedure: TRANSESOPHAGEAL ECHOCARDIOGRAM (TEE);  Surgeon: Lenise Quince, MD;  Location: [facility] OR;  Service: Cardiovascular;  Laterality: N/A;   TEE WITHOUT CARDIOVERSION N/A 04/24/2016   Procedure: TRANSESOPHAGEAL ECHOCARDIOGRAM (TEE);  Surgeon: Darlis Eisenmenger, MD;  Location: [facility] ENDOSCOPY;  Service: Cardiovascular;  Laterality: N/A;   TEE  WITHOUT CARDIOVERSION N/A 05/26/2016   Procedure: TRANSESOPHAGEAL ECHOCARDIOGRAM (TEE);  Surgeon: Darlis Eisenmenger, MD;  Location: [facility] ENDOSCOPY;  Service: Cardiovascular;  Laterality: N/A;   TEE WITHOUT CARDIOVERSION N/A 09/08/2016   Procedure: TRANSESOPHAGEAL ECHOCARDIOGRAM (TEE);  Surgeon: Darlis Eisenmenger, MD;  Location: [facility] ENDOSCOPY;  Service: Cardiovascular;  Laterality: N/A;   TRANSURETHRAL RESECTION OF BLADDER TUMOR N/A 12/14/2020   Procedure: TRANSURETHRAL RESECTION OF BLADDER TUMOR (TURBT) 2-5 cm/ POST OPERTIVE INSTILLATION OF GEMCITABINE ;  Surgeon: Christina Coyer, MD;  Location: [facility];  Service: Urology;  Laterality: N/A;   TRANSURETHRAL RESECTION OF BLADDER TUMOR N/A 02/22/2021   Procedure: TRANSURETHRAL RESECTION OF BLADDER TUMOR (TURBT);  Surgeon: Christina Coyer, MD;  Location: [facility];  Service: Urology;  Laterality: N/A;    Prior to Admission medications   Medication Sig Start Date End Date Taking? Authorizing Provider  insulin  glargine (LANTUS  SOLOSTAR) 100 UNIT/ML Solostar Pen Inject 20 Units into the skin every morning. 07/02/23  Yes Laneta Pintos, MD  Semaglutide ,0.25 or 0.5MG /DOS, 2 MG/3ML SOPN Inject 0.5 mg into the skin once a week. 05/25/23  Yes Laneta Pintos, MD  atorvastatin  (LIPITOR ) 80 MG tablet Take 1 tablet (80 mg total) by mouth every morning. Patient not taking: Reported on 05/18/2023 04/22/23   Laneta Pintos, MD  Blood Glucose Monitoring Suppl (BLOOD GLUCOSE MONITOR SYSTEM) w/Device KIT Test in the morning, at noon, and at bedtime. Patient not taking: Reported on 05/18/2023 02/05/23   Laneta Pintos, MD  Blood Pressure Monitoring (BLOOD PRESSURE KIT) DEVI Check blood pressure daily Patient not taking: Reported on 05/18/2023 02/12/23   Laneta Pintos, MD  ezetimibe  (ZETIA ) 10 MG tablet Take 1 tablet (10 mg total) by mouth every morning. Patient not taking: Reported on 05/18/2023 04/22/23   Laneta Pintos, MD  metFORMIN   (GLUCOPHAGE ) 1000 MG tablet Take 1 tablet (1,000 mg total) by mouth every morning. Patient not taking: Reported on 07/03/2023 04/22/23   Laneta Pintos, MD  oxybutynin  (DITROPAN ) 5 MG tablet Take 1 tablet (5 mg total) by mouth 2 (two) times daily as needed for bladder spasms. Patient not taking: Reported on 05/18/2023 02/24/22   Christina Coyer, MD  rivaroxaban  (XARELTO ) 20 MG TABS tablet Take 1 tablet (20 mg total) by mouth every morning. Patient not taking: Reported on 05/18/2023 04/22/23   Laneta Pintos, MD  tamsulosin  (FLOMAX ) 0.4 MG CAPS capsule Take 1 capsule (0.4 mg total) by mouth daily after supper. Patient not taking: Reported on 05/18/2023 02/24/22   Christina Coyer, MD    Current Facility-Administered Medications  Medication Dose Route Frequency Provider Last Rate Last Admin   piperacillin-tazobactam (ZOSYN) IVPB 3.375 g  3.375 g Intravenous Once Long, Joshua G, MD 100 mL/hr at 07/03/23 1525 3.375 g at 07/03/23 1525   Current Outpatient Medications  Medication Sig Dispense Refill   insulin  glargine (LANTUS  SOLOSTAR) 100 UNIT/ML Solostar Pen Inject 20 Units into the skin every morning. 15 mL 3   Semaglutide ,0.25 or 0.5MG /DOS, 2 MG/3ML SOPN Inject 0.5 mg into the skin once a week. 3 mL 1   atorvastatin  (LIPITOR ) 80 MG tablet Take 1 tablet (80 mg total) by mouth every morning. (Patient not taking: Reported on 05/18/2023) 30 tablet 11   Blood Glucose Monitoring Suppl (BLOOD GLUCOSE MONITOR SYSTEM) w/Device KIT Test in the morning, at noon, and at bedtime. (Patient not taking: Reported on 05/18/2023) 1 kit 0   Blood Pressure Monitoring (BLOOD PRESSURE KIT) DEVI Check blood pressure daily (Patient not taking: Reported on 05/18/2023) 1 each 0   ezetimibe  (ZETIA ) 10 MG tablet Take 1 tablet (10 mg total) by mouth every morning. (Patient not taking: Reported on 05/18/2023) 30 tablet 11   metFORMIN  (GLUCOPHAGE ) 1000 MG tablet Take 1 tablet (1,000 mg total) by mouth every morning. (Patient not taking:  Reported on 07/03/2023) 30 tablet 11   oxybutynin  (DITROPAN ) 5 MG tablet Take 1 tablet (5 mg total) by mouth 2 (two) times daily as needed for bladder spasms. (Patient not taking: Reported on 05/18/2023) 30 tablet 3   rivaroxaban  (XARELTO ) 20 MG TABS tablet Take 1 tablet (20 mg total) by mouth every morning. (Patient not taking: Reported on 05/18/2023) 30 tablet 11   tamsulosin  (FLOMAX ) 0.4 MG CAPS capsule Take 1 capsule (0.4 mg total) by mouth daily after supper. (Patient not taking: Reported on 05/18/2023) 90 capsule 3    Allergies as of 07/03/2023   (No Known Allergies)    Family History  Problem Relation Age of Onset   Diabetes Father        died in his 60s   Hypertension Father    Seizures Mother  died in her 70s   Hypertension Sister    Hypertension Paternal Grandfather    Heart attack Neg Hx    Stroke Neg Hx     Social History   Socioeconomic History   Marital status: Single    Spouse name: Not on file   Number of children: 0   Years of education: 12   Highest education level: Not on file  Occupational History   Occupation: unemployeed  Tobacco Use   Smoking status: Never   Smokeless tobacco: Never  Vaping Use   Vaping status: Never Used  Substance and Sexual Activity   Alcohol use: Yes    Comment: some   Drug use: Not Currently   Sexual activity: Not on file  Other Topics Concern   Not on file  Social History Narrative   Admits that he eats poorly and eats a lot at work.      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   Social Drivers of Health   Financial Resource Strain: High Risk (07/30/2020)   Overall Financial Resource Strain (CARDIA)    Difficulty of Paying Living Expenses: Hard  Food Insecurity: Food Insecurity Present (07/30/2020)   Hunger Vital Sign    Worried About Running Out of Food in the Last Year: Sometimes true    Ran Out of Food in the Last Year: Sometimes true  Transportation Needs: No Transportation Needs  (07/30/2020)   PRAPARE - Administrator, Civil Service (Medical): No    Lack of Transportation (Non-Medical): No  Physical Activity: Inactive (07/30/2020)   Exercise Vital Sign    Days of Exercise per Week: 0 days    Minutes of Exercise per Session: 0 min  Stress: Stress Concern Present (07/30/2020)   [employer] of Occupational Health - Occupational Stress Questionnaire    Feeling of Stress : To some extent  Social Connections: Socially Isolated (07/30/2020)   Social Connection and Isolation Panel [NHANES]    Frequency of Communication with Friends and Family: More than three times a week    Frequency of Social Gatherings with Friends and Family: Never    Attends Religious Services: Never    Database administrator or Organizations: No    Attends Banker Meetings: Never    Marital Status: Never married  Intimate Partner Violence: Not At Risk (07/30/2020)   Humiliation, Afraid, Rape, and Kick questionnaire    Fear of Current or Ex-Partner: No    Emotionally Abused: No    Physically Abused: No    Sexually Abused: No    Review of Systems: Pertinent positive and negative review of systems were noted in the above HPI section.  All other review of systems was otherwise negative.  Physical Exam: Vital signs in last 24 hours: Temp:  [97.7 F (36.5 C)] 97.7 F (36.5 C) (05/23 1502) Pulse Rate:  [66-98] 76 (05/23 1300) Resp:  [17-20] 20 (05/23 1300) BP: (146-153)/(98-102) 146/99 (05/23 1300) SpO2:  [92 %-97 %] 92 % (05/23 1300) Weight:  [108.9 kg] 108.9 kg (05/23 1117)   General:   Alert,  Well-developed, older white male pleasant and cooperative in NAD-very talkative has to be redirected Head:  Normocephalic and atraumatic. Eyes:  Sclera icteric conjunctiva pink. Ears:  Normal auditory acuity. Nose:  No deformity, discharge,  or lesions. Mouth:  No deformity or lesions.   Neck:  Supple; no masses or thyromegaly. Lungs:  Clear throughout to auscultation.    No wheezes, crackles, or rhonchi.  Heart:  Regular rate and rhythm; no murmurs, clicks, rubs,  or gallops. Abdomen:  Soft, sounds are present, he is tender in the epigastrium and right upper quadrant no guarding or rebound no palpable mass or hepatosplenomegaly, abdomen obese Rectal: None Msk:  Symmetrical without gross deformities. . Pulses:  Normal pulses noted. Extremities:  Without clubbing or edema. Neurologic:  Alert and  oriented x4;  grossly normal neurologically. Skin: Jaundiced Psych:  Alert and cooperative. Normal mood and affect.  Intake/Output from previous day: No intake/output data recorded. Intake/Output this shift: No intake/output data recorded.  Lab Results: Recent Labs    07/03/23 1119  WBC 15.4*  HGB 15.9  HCT 46.5  PLT 179   BMET Recent Labs    07/03/23 1119  NA 133*  K 3.3*  CL 100  CO2 21*  GLUCOSE 185*  BUN 14  CREATININE 1.36*  CALCIUM  9.2   LFT Recent Labs    07/03/23 1119  PROT 7.2  ALBUMIN 3.5  AST 142*  ALT 215*  ALKPHOS 188*  BILITOT 8.4*   PT/INR No results for input(s): "LABPROT", "INR" in the last 72 hours. Hepatitis Panel No results for input(s): "HEPBSAG", "HCVAB", "HEPAIGM", "HEPBIGM" in the last 72 hours.   IMPRESSION:  #29 68 year old white male with onset of acute illness about 4 days ago with diarrhea likely infectious after eating tacos that tasted off .  Onset last p.m. with acute mid and upper abdominal pain which became rather diffuse, constant and unrelenting associated with nausea but no vomiting  In the ER today shows cholelithiasis, probable cholecystitis as well as what appears to be 4 small stones in the distal common bile duct  T. bili 8.4/WBC 15.4 Is afebrile  #2 combined congestive heart failure EF 50 to 55% 3.  Insulin -dependent diabetes mellitus 4.  Atrial fibrillation-supposed to be on Xarelto  but not taking 5.  History of bladder cancer 6.  Sleep apnea  Plan; scheduled for MRI/MRCP this  evening Ok for  full liquid diet after MRCP  Agree with IV Zosyn Repeat labs in a.m. INR in am Expect he will need ERCP with stone extraction.  There is no room on the biliary schedule for this procedure to be done tomorrow, likely Sunday Dr Brice Campi . Will discuss procedure with patient in further detail after MRCP. Will also need cholecystectomy this admission GI  will follow with you    Kian Gamarra EsterwoodPA-C  07/03/2023, 3:53 PM

## 2023-07-03 NOTE — Progress Notes (Signed)
 Pharmacy Antibiotic Note  Darius [name] is a 68 y.o. male admitted on 07/03/2023 with acute cholecystitis.  Pharmacy has been consulted for zosyn dosing.  Plan: Zosyn 3.375g IV q8h (4 hour infusion). Follow culture data for de-escalation.  Monitor renal function for dose adjustments as indicated.   Height: 6\' 2"  (188 cm) Weight: 108.9 kg (240 lb) IBW/kg (Calculated) : 82.2  Temp (24hrs), Avg:97.7 F (36.5 C), Min:97.7 F (36.5 C), Max:97.7 F (36.5 C)  Recent Labs  Lab 07/03/23 1119  WBC 15.4*  CREATININE 1.36*    Estimated Creatinine Clearance: 67.4 mL/min (A) (by C-G formula based on SCr of 1.36 mg/dL (H)).    No Known Allergies  Antimicrobials this admission: Zosyn 5/23 >>   Microbiology results: 5/23 UCx:    Thank you for allowing pharmacy to be a part of this patient's care.  Mamie Searles, PharmD, BCCCP  07/03/2023 4:12 PM

## 2023-07-03 NOTE — Telephone Encounter (Signed)
 Chief Complaint: abdominal pain Symptoms: abdominal pain, diarrhea Frequency: diarrhea 3 days ago, pain since yesterday Pertinent Negatives: Patient denies fever, vomiting, bloody stools, CP, SOB Disposition: [x] ED /[] Urgent Care (no appt availability in office) / [] Appointment(In office/virtual)/ []  [place]/ [] Home Care/ [] Refused Recommended Disposition /[] [place] [facility]/ []  Follow-up with PCP Additional Notes: Pt reports abdominal pain. Pt states symptoms began 3 days ago with very watery diarrhea. Pt states at that time he did not have abdominal pain. Then, yesterday pt's diarrhea began to resolve and his bowel movements became firmer. At that time, pt developed severe abdominal pain. Pt reports 5-6/10 pain now and states it is constant. Pt denies vomiting. Pt has had limited oral intake but states he is trying to drink plenty of water . Denies bloody stools, fever, dizziness. RN advised pt he needs to be seen within 4 hours, pt agreeable to that. No availability in the office in that time frame. RN advised pt he needs to go to the ED. Pt agreeable. RN advised pt if he develops CP, SOB, bloody stools, vomiting, or dizziness he needs to call 911. Pt verbalized understanding.     Copied from CRM [id number]. Topic: Clinical - Red Word Triage >> Jul 03, 2023 10:00 AM Darius [name] wrote: Red Word that prompted transfer to Nurse Triage: Patient is stating that he has diahrhea for 3 days and now it is getting solid but now he is having terrible stomach pain, he has tried to take tums and they are not working. He stated that the pharmicist informed him to call dr to get antibiotics he is stating that his urine is brown also . Reason for Disposition  [1] MILD-MODERATE pain AND [2] constant AND [3] present > 2 hours  Answer Assessment - Initial Assessment Questions 1. LOCATION: "Where does it hurt?"      "Stomach and intestine area" 2. RADIATION: "Does the pain shoot anywhere else?"  (e.g., chest, back)     Does not radiate  3. ONSET: "When did the pain begin?" (Minutes, hours or days ago)      Diarrhea began 3 days ago, pt states it was very watery, now stool is more solid but he is having pain 4. SUDDEN: "Gradual or sudden onset?"     Sudden once his stools became more firm  5. PATTERN "Does the pain come and go, or is it constant?"    - If it comes and goes: "How long does it last?" "Do you have pain now?"     (Note: Comes and goes means the pain is intermittent. It goes away completely between bouts.)    - If constant: "Is it getting better, staying the same, or getting worse?"      (Note: Constant means the pain never goes away completely; most serious pain is constant and gets worse.)      Constant  6. SEVERITY: "How bad is the pain?"  (e.g., Scale 1-10; mild, moderate, or severe)    - MILD (1-3): Doesn't interfere with normal activities, abdomen soft and not tender to touch.     - MODERATE (4-7): Interferes with normal activities or awakens from sleep, abdomen tender to touch.     - SEVERE (8-10): Excruciating pain, doubled over, unable to do any normal activities.       "Bad enough that I cannot sleep or rest, I can't move, it hurts" - at least a 5-6/10; "never gone away" 7. RECURRENT SYMPTOM: "Have you ever had this type of  stomach pain before?" If Yes, ask: "When was the last time?" and "What happened that time?"       8. CAUSE: "What do you think is causing the stomach pain?"     Not sure  9. RELIEVING/AGGRAVATING FACTORS: "What makes it better or worse?" (e.g., antacids, bending or twisting motion, bowel movement)     Pain worsened with a firmer BM  10. OTHER SYMPTOMS: "Do you have any other symptoms?" (e.g., back pain, diarrhea, fever, urination pain, vomiting)       Diarrhea for 3 days, "getting more solid"; pt called 911 last night and EMS checked his vitals and BG, pt states those numbers were normal; pt did not want to go to the ED at that time; pt has  had 2-3 Tums w/ no relief, nausea with no vomiting. Yesterday his stools got more solid, but he started hurting   Endorses darker urine - "I am trying to drink enough water  to keep me hydrated", my mouth is dry. States he has not been able to eat normally lately. Denies dizziness. "I'm weak because I haven't eaten anything" (ate 3 cheezits). Denies dysuria  Protocols used: Abdominal Pain - Male-A-AH

## 2023-07-03 NOTE — ED Notes (Signed)
 Awaiting patient from lobby.

## 2023-07-03 NOTE — Consult Note (Incomplete)
 Reason for Consult/Chief Complaint: *** Consultant: ***, {Blank single:19197::"DO","PA","MD"}  Darius [name] is an 68 y.o. male.   HPI: ***  Past Medical History:  Diagnosis Date   Chronic combined systolic and diastolic heart failure (HCC)    followed by cardiologist;  a. Echo 03/27/11: EF 50-55%, inferior hypokinesis, moderate LAE, mild RVE, normal pulmonary pressures. ;   b.  TEE (04/03/11): EF 40-45%, mild MR moderate LAE, no LAA clot, mild RAE;  c.  Echocardiogram (02/2013): EF 60-65%, Gr 2 DD, mildly dilated Ao root (Ao root dimension 39 mm), MAC, mild LAE, normal RVF   Complication of anesthesia    pt unable to tolerate versed  and fentanyl  sedation and required general anesthesia with TEE 02/ 2013   Depression    Diabetes mellitus, type 2 (HCC)    followed by pcp  (02-21-2021 pt stated does not check blood sugar )   Hx of cardiovascular stress test    Lexiscan  Myoview (02/2013): No ischemia or scar, EF 51%, low risk;  cardiac cath 04-21-2016  no CAD, NICM   Malignant neoplasm of urinary bladder (HCC) 12/2020   urologist--- dr Derrick Fling;  oncologist-- dr Dirk Fredericks,  high grade muscle invasion   Morbid obesity (HCC)    NICM (nonischemic cardiomyopathy) (HCC) 04/2016   followed by cardiology;   dx 03/ 2018 per TEE ef 15%;  TEE 04/ 2018 ef 25%;  TEE 07/ 2018 ef 40%;  echo 12/ 2021 ef 55%   OSA (obstructive sleep apnea)    followed by dr t. turner--  no cpap/ bipap:: previously followed  by dr clance sleep study 02-11-2013  very severe osa w/ AHI 108/hr had used cpap until 2017:   repeat sleep study 02-14-2018 in epic severe osa w/ hypoxemia, AHI 46.3/hr titrated 04-15-2018 used cpap for awhile   Persistent atrial fibrillation (HCC) 03/2011   cardiologist--   s/p TEE-DCCV (failed);  Amiodarone  started    Past Surgical History:  Procedure Laterality Date   CARDIOVERSION  04/01/2011   Procedure: CARDIOVERSION;  Surgeon: Lenise Quince, MD;  Location: [facility] ENDOSCOPY;  Service:  Cardiovascular;  Laterality: N/A;   CARDIOVERSION  04/03/2011   Procedure: CARDIOVERSION;  Surgeon: Lenise Quince, MD;  Location: [facility] ENDOSCOPY;  Service: Cardiovascular;  Laterality: N/A;   CARDIOVERSION  04/03/2011   Procedure: CARDIOVERSION;  Surgeon: Lenise Quince, MD;  Location: [facility] OR;  Service: Cardiovascular;  Laterality: N/A;   CARDIOVERSION N/A 06/15/2014   Procedure: CARDIOVERSION;  Surgeon: Darlis Eisenmenger, MD;  Location: [facility]COPY;  Service: Cardiovascular;  Laterality: N/A;   CARDIOVERSION N/A 09/08/2016   Procedure: CARDIOVERSION;  Surgeon: Darlis Eisenmenger, MD;  Location: [facility] ENDOSCOPY;  Service: Cardiovascular;  Laterality: N/A;   I & D EXTREMITY Right 09/19/2015   Procedure: IRRIGATION AND DEBRIDEMENT EXTREMITY;  Surgeon: Arnie Lao, MD;  Location: [facility] OR;  Service: Orthopedics;  Laterality: Right;   RIGHT/LEFT HEART CATH AND CORONARY ANGIOGRAPHY N/A 04/21/2016   Procedure: Right/Left Heart Cath and Coronary Angiography;  Surgeon: Darlis Eisenmenger, MD;  Location: [facility] INVASIVE CV LAB;  Service: Cardiovascular;  Laterality: N/A;   TEE WITHOUT CARDIOVERSION  04/01/2011   Procedure: TRANSESOPHAGEAL ECHOCARDIOGRAM (TEE);  Surgeon: Lenise Quince, MD;  Location: [facility] ENDOSCOPY;  Service: Cardiovascular;  Laterality: N/A;   TEE WITHOUT CARDIOVERSION  04/03/2011   Procedure: TRANSESOPHAGEAL ECHOCARDIOGRAM (TEE);  Surgeon: Lenise Quince, MD;  Location: [facility] ENDOSCOPY;  Service: Cardiovascular;  Laterality: N/A;   TEE WITHOUT CARDIOVERSION  04/03/2011  Procedure: TRANSESOPHAGEAL ECHOCARDIOGRAM (TEE);  Surgeon: Lenise Quince, MD;  Location: [facility] OR;  Service: Cardiovascular;  Laterality: N/A;   TEE WITHOUT CARDIOVERSION N/A 04/24/2016   Procedure: TRANSESOPHAGEAL ECHOCARDIOGRAM (TEE);  Surgeon: Darlis Eisenmenger, MD;  Location: [facility] ENDOSCOPY;  Service: Cardiovascular;  Laterality: N/A;   TEE WITHOUT CARDIOVERSION N/A 05/26/2016   Procedure: TRANSESOPHAGEAL ECHOCARDIOGRAM (TEE);   Surgeon: Darlis Eisenmenger, MD;  Location: [facility] ENDOSCOPY;  Service: Cardiovascular;  Laterality: N/A;   TEE WITHOUT CARDIOVERSION N/A 09/08/2016   Procedure: TRANSESOPHAGEAL ECHOCARDIOGRAM (TEE);  Surgeon: Darlis Eisenmenger, MD;  Location: [facility] ENDOSCOPY;  Service: Cardiovascular;  Laterality: N/A;   TRANSURETHRAL RESECTION OF BLADDER TUMOR N/A 12/14/2020   Procedure: TRANSURETHRAL RESECTION OF BLADDER TUMOR (TURBT) 2-5 cm/ POST OPERTIVE INSTILLATION OF GEMCITABINE ;  Surgeon: Christina Coyer, MD;  Location: [facility];  Service: Urology;  Laterality: N/A;   TRANSURETHRAL RESECTION OF BLADDER TUMOR N/A 02/22/2021   Procedure: TRANSURETHRAL RESECTION OF BLADDER TUMOR (TURBT);  Surgeon: Christina Coyer, MD;  Location: [facility];  Service: Urology;  Laterality: N/A;    Family History  Problem Relation Age of Onset   Diabetes Father        died in his 72s   Hypertension Father    Seizures Mother        died in her 36s   Hypertension Sister    Hypertension Paternal Grandfather    Heart attack Neg Hx    Stroke Neg Hx     Social History:  reports that he has never smoked. He has never used smokeless tobacco. He reports current alcohol use. He reports that he does not currently use drugs.  Allergies: No Known Allergies  Medications: I have reviewed the patient's current medications.  Results for orders placed or performed during the hospital encounter of 07/03/23 (from the past 48 hours)  Lipase, blood     Status: None   Collection Time: 07/03/23 11:19 AM  Result Value Ref Range   Lipase 34 11 - 51 U/L    Comment: Performed at [facility], [address], [place], [place] [address]  Comprehensive metabolic panel     Status: Abnormal   Collection Time: 07/03/23 11:19 AM  Result Value Ref Range   Sodium 133 (L) 135 - 145 mmol/L   Potassium 3.3 (L) 3.5 - 5.1 mmol/L   Chloride 100 98 - 111 mmol/L   CO2 21 (L) 22 - 32 mmol/L   Glucose, Bld 185 (H) 70 - 99  mg/dL    Comment: Glucose reference range applies only to samples taken after fasting for at least 8 hours.   BUN 14 8 - 23 mg/dL   Creatinine, Ser 6.04 (H) 0.61 - 1.24 mg/dL   Calcium  9.2 8.9 - 10.3 mg/dL   Total Protein 7.2 6.5 - 8.1 g/dL   Albumin 3.5 3.5 - 5.0 g/dL   AST 540 (H) 15 - 41 U/L   ALT 215 (H) 0 - 44 U/L   Alkaline Phosphatase 188 (H) 38 - 126 U/L   Total Bilirubin 8.4 (H) 0.0 - 1.2 mg/dL   GFR, Estimated 56 (L) >60 mL/min    Comment: (NOTE) Calculated using the CKD-EPI Creatinine Equation (2021)    Anion gap 12 5 - 15    Comment: Performed at [facility], [address] [place], [place] [address]  CBC     Status: Abnormal   Collection Time: 07/03/23 11:19 AM  Result Value Ref Range  WBC 15.4 (H) 4.0 - 10.5 K/uL   RBC 5.44 4.22 - 5.81 MIL/uL   Hemoglobin 15.9 13.0 - 17.0 g/dL   HCT 60.4 54.0 - 98.1 %   MCV 85.5 80.0 - 100.0 fL   MCH 29.2 26.0 - 34.0 pg   MCHC 34.2 30.0 - 36.0 g/dL   RDW 19.1 47.8 - 29.5 %   Platelets 179 150 - 400 K/uL   nRBC 0.0 0.0 - 0.2 %    Comment: Performed at [facility], [address], [place], [place] [address]  Urinalysis, Routine w reflex microscopic -Urine, Clean Catch     Status: Abnormal   Collection Time: 07/03/23 11:19 AM  Result Value Ref Range   Color, Urine AMBER (A) YELLOW    Comment: BIOCHEMICALS MAY BE AFFECTED BY COLOR   APPearance HAZY (A) CLEAR   Specific Gravity, Urine 1.023 1.005 - 1.030   pH 5.0 5.0 - 8.0   Glucose, UA >=500 (A) NEGATIVE mg/dL   Hgb urine dipstick NEGATIVE NEGATIVE   Bilirubin Urine MODERATE (A) NEGATIVE   Ketones, ur 5 (A) NEGATIVE mg/dL   Protein, ur 30 (A) NEGATIVE mg/dL   Nitrite NEGATIVE NEGATIVE   Leukocytes,Ua SMALL (A) NEGATIVE   RBC / HPF 0-5 0 - 5 RBC/hpf   WBC, UA 11-20 0 - 5 WBC/hpf   Bacteria, UA FEW (A) NONE SEEN   Squamous Epithelial / HPF 0-5 0 - 5 /HPF   Mucus PRESENT     Comment: Performed at [facility], [address] [place], [place] [address]     CT ABDOMEN PELVIS W CONTRAST Result Date: 07/03/2023 CLINICAL DATA:  Abdominal pain, acute, nonlocalized. History of urinary bladder cancer. * Tracking Code: BO * EXAM: CT ABDOMEN AND PELVIS WITH CONTRAST TECHNIQUE: Multidetector CT imaging of the abdomen and pelvis was performed using the standard protocol following bolus administration of intravenous contrast. RADIATION DOSE REDUCTION: This exam was performed according to the departmental dose-optimization program which includes automated exposure control, adjustment of the mA and/or kV according to patient size and/or use of iterative reconstruction technique. CONTRAST:  75mL OMNIPAQUE  IOHEXOL  350 MG/ML SOLN COMPARISON:  CT scan chest, abdomen and pelvis from 06/08/2023. FINDINGS: Lower chest: There are subpleural atelectatic changes in the visualized lung bases. No overt consolidation. No pleural effusion. The heart is normal in size. No pericardial effusion. Hepatobiliary: The liver is normal in size. Non-cirrhotic configuration. No suspicious mass. These is mild diffuse hepatic steatosis. There is a subcapsular simple cyst measuring 2.0 x 2.5 cm in the left hepatic lobe, segment 4A. The gallbladder is distended and exhibit mild diffuse gallbladder wall thickening and mild to moderate pericholecystic fat stranding. There are several, sub 5 mm calcified gallstones. No intrahepatic bile duct dilation. The extrahepatic bile duct is also not dilated however, there are at least 4, 2-5 mm sized calculi in the distal extrahepatic bile duct. Findings favor acute cholecystitis and choledocholithiasis. Pancreas: Unremarkable. No pancreatic ductal dilatation or surrounding inflammatory changes. Spleen: Within normal limits. No focal lesion. 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 Unremarkable right adrenal gland. No suspicious renal mass. There is a 1.9 x 2.7 cm cyst arising from the left kidney upper pole which exhibit thin  intervening partially calcified septum. No discrete mural nodule. No nephroureterolithiasis or obstructive uropathy on either side. Focal scarring noted in the right kidney lower pole, laterally. Urinary bladder is under distended, precluding optimal assessment. However, no large mass  or stones identified. No perivesical fat stranding. Stomach/Bowel: No disproportionate dilation of the small or large bowel loops. No evidence of abnormal bowel wall thickening or inflammatory changes. The appendix is unremarkable. There is fecalization of several small bowel loops, which is nonspecific but can be seen with slow transit time/incompetent ileocecal valve. No bowel obstruction. Vascular/Lymphatic: No ascites or pneumoperitoneum. There is a stable oval 9 x 13 mm right retrocrural hypoattenuating lesion, which is tubular and favored to represent probable thrombosed vascular structure. No abdominal or pelvic lymphadenopathy, by size criteria. No aneurysmal dilation of the major abdominal arteries. There are mild peripheral atherosclerotic vascular calcifications of the aorta and its major branches. Reproductive: Normal size prostate. Symmetric seminal vesicles. Other: There are bilateral small fat containing inguinal hernias. The soft tissues and abdominal wall are otherwise unremarkable. Musculoskeletal: No suspicious osseous lesions. There are mild - moderate multilevel degenerative changes in the visualized spine. IMPRESSION: 1. Findings favor acute cholecystitis and choledocholithiasis. 2. No metastatic bladder carcinoma in the abdomen or pelvis. 3. Multiple other nonacute observations, as described above. Aortic Atherosclerosis (ICD10-I70.0). Electronically Signed   By: Beula Brunswick M.D.   On: 07/03/2023 14:52    ROS 10 point review of systems is negative except as listed above in HPI.   Physical Exam Blood pressure (!) 140/97, pulse 79, temperature 97.7 F (36.5 C), temperature source Oral, resp. rate (!) 22,  height 6\' 2"  (1.88 m), weight 108.9 kg, SpO2 94%. Constitutional: well-developed, well-nourished*** HEENT: pupils equal, round, reactive to light, 2***mm b/l, moist conjunctiva, external inspection of ears and nose normal, hearing {Blank single:19197::"intact","diminished","unable to be assessed"} Oropharynx: normal oropharyngeal mucosa, {Blank single:19197::"normal","poor"} dentition Neck: no thyromegaly, trachea midline, {Blank single:19197::"+","no","unable to assess"} midline cervical tenderness to palpation Chest: breath sounds equal bilaterally, {Blank single:19197::"normal","absent","labored"} respiratory effort, {Blank single:19197::"+","no"} midline or lateral chest wall tenderness to palpation/deformity Abdomen: soft, NT, no bruising, no hepatosplenomegaly GU: {Blank single:19197::"no blood at urethral meatus of penis, no scrotal masses or abnormality","normal male genitalia"}  Back: no wounds, {Blank single:19197::"+","no","unable to assess"} thoracic/lumbar spine tenderness to palpation, {Blank single:19197::"+","no"} thoracic/lumbar spine stepoffs Rectal: {Blank single:19197::"good tone, no blood","deferred"} Extremities: 2+ radial and pedal pulses bilaterally, {Blank single:19197::"intact","unable to assess"} motor and sensation bilateral UE and LE, {Blank single:19197::"+","no"} peripheral edema MSK: {Blank single:19197::"normal","abnormal","unable to assess"} gait/station, no clubbing/cyanosis of fingers/toes, {Blank single:19197::"normal","limited","unable to assess"} ROM of all four extremities Skin: warm, dry, no rashes Psych: {Blank single:19197::"normal memory, normal mood/affect","unable to assess"}     Assessment/Plan: ***  *** -  FEN - {diet:29922} DVT - SCDs, {Blank single:19197::"LMWH","LMWH 40BID","SQH","hold chemical ppx due to bleeding concerns"} Dispo - {Blank single:19197::"ICU","4NP","med-surg","***"}    Anda Bamberg, MD General and Trauma  Surgery [facility] Surgery

## 2023-07-04 ENCOUNTER — Inpatient Hospital Stay (HOSPITAL_COMMUNITY)

## 2023-07-04 DIAGNOSIS — K810 Acute cholecystitis: Secondary | ICD-10-CM | POA: Diagnosis not present

## 2023-07-04 DIAGNOSIS — I509 Heart failure, unspecified: Secondary | ICD-10-CM

## 2023-07-04 DIAGNOSIS — K8050 Calculus of bile duct without cholangitis or cholecystitis without obstruction: Secondary | ICD-10-CM

## 2023-07-04 DIAGNOSIS — K8042 Calculus of bile duct with acute cholecystitis without obstruction: Secondary | ICD-10-CM

## 2023-07-04 DIAGNOSIS — Z01810 Encounter for preprocedural cardiovascular examination: Secondary | ICD-10-CM | POA: Diagnosis not present

## 2023-07-04 LAB — COMPREHENSIVE METABOLIC PANEL WITH GFR
ALT: 165 U/L — ABNORMAL HIGH (ref 0–44)
AST: 80 U/L — ABNORMAL HIGH (ref 15–41)
Albumin: 2.9 g/dL — ABNORMAL LOW (ref 3.5–5.0)
Alkaline Phosphatase: 170 U/L — ABNORMAL HIGH (ref 38–126)
Anion gap: 10 (ref 5–15)
BUN: 12 mg/dL (ref 8–23)
CO2: 25 mmol/L (ref 22–32)
Calcium: 9 mg/dL (ref 8.9–10.3)
Chloride: 100 mmol/L (ref 98–111)
Creatinine, Ser: 1.35 mg/dL — ABNORMAL HIGH (ref 0.61–1.24)
GFR, Estimated: 57 mL/min — ABNORMAL LOW (ref >60)
Glucose, Bld: 97 mg/dL (ref 70–99)
Potassium: 4.1 mmol/L (ref 3.5–5.1)
Sodium: 135 mmol/L (ref 135–145)
Total Bilirubin: 6.6 mg/dL — ABNORMAL HIGH (ref 0.0–1.2)
Total Protein: 6.3 g/dL — ABNORMAL LOW (ref 6.5–8.1)

## 2023-07-04 LAB — ECHOCARDIOGRAM COMPLETE
AR max vel: 2.26 cm2
AV Area VTI: 2.28 cm2
AV Area mean vel: 2.34 cm2
AV Mean grad: 7 mmHg
AV Peak grad: 12.7 mmHg
Ao pk vel: 1.78 m/s
Area-P 1/2: 6.65 cm2
Calc EF: 53.5 %
Height: 74 in
S' Lateral: 3.5 cm
Single Plane A2C EF: 53 %
Single Plane A4C EF: 54.2 %
Weight: 3840 [oz_av]

## 2023-07-04 LAB — CBC
HCT: 44.7 % (ref 39.0–52.0)
Hemoglobin: 15.2 g/dL (ref 13.0–17.0)
MCH: 29.2 pg (ref 26.0–34.0)
MCHC: 34 g/dL (ref 30.0–36.0)
MCV: 85.8 fL (ref 80.0–100.0)
Platelets: 167 10*3/uL (ref 150–400)
RBC: 5.21 MIL/uL (ref 4.22–5.81)
RDW: 13.2 % (ref 11.5–15.5)
WBC: 12.4 10*3/uL — ABNORMAL HIGH (ref 4.0–10.5)
nRBC: 0 % (ref 0.0–0.2)

## 2023-07-04 LAB — GLUCOSE, CAPILLARY
Glucose-Capillary: 104 mg/dL — ABNORMAL HIGH (ref 70–99)
Glucose-Capillary: 220 mg/dL — ABNORMAL HIGH (ref 70–99)
Glucose-Capillary: 121 mg/dL — ABNORMAL HIGH (ref 70–99)
Glucose-Capillary: 143 mg/dL — ABNORMAL HIGH (ref 70–99)

## 2023-07-04 LAB — PROTIME-INR
INR: 1.2 (ref 0.8–1.2)
Prothrombin Time: 15.4 s — ABNORMAL HIGH (ref 11.4–15.2)

## 2023-07-04 MED ORDER — METOPROLOL TARTRATE 12.5 MG HALF TABLET
12.5000 mg | ORAL_TABLET | Freq: Two times a day (BID) | ORAL | Status: DC
  Administered 2023-07-04 – 2023-07-05 (×4): 12.5 mg via ORAL
  Filled 2023-07-04 (×4): qty 1

## 2023-07-04 NOTE — Consult Note (Signed)
 Cardiology Consultation   Patient ID: Darius [name] MRN: [id number]; DOB: [birth year]  Admit date: 07/03/2023 Date of Consult: 07/04/2023  PCP:  Laneta Pintos, MD   [facility] Providers Cardiologist:  Peder Bourdon, MD  Electrophysiologist:  Will Cortland Ding, MD       Patient Profile:   Darius [name] is a 68 y.o. male with a hx of HFrecEF, persistent atrial fibrillation, diabetes mellitus, hyperlipidemia, OSA, nonischemic cardiomyopathy with recovered EF, prior left atrial appendage thrombus previously on Eliquis , who is being seen 07/04/2023 for the evaluation of preoperative cardiovascular evaluation at the request of Dr. Roselinda Congress and Gen Surg.  History of Present Illness:   Darius [name] was previously followed by the heart failure clinic last seen by heart failure November 2024.  History includes admission for acute systolic heart failure and atrial fibrillation in March 2018 had a right and left heart cath at that time with no coronary artery disease and elevated right heart pressures with low cardiac output with ejection fraction 15% was started on heart failure therapy and diuresis.  Thought was to cardiovert him, however he had a left atrial appendage thrombus, and on repeat evaluation, thrombus was still present likely due to missed doses of Eliquis , finally pursued imaging guided cardioversion with restoration of sinus rhythm in July 2018 at that time TEE showed ejection fraction 40 to 45%, though he had recurrence of atrial fibrillation subsequently.  Given medication nonadherence a rate control strategy has been pursued since this time.  Echocardiogram most recently has shown ejection fraction 50 to 55%.  He has been diagnosed with bladder cancer in November 2022 and undergone treatment.  At last heart failure follow-up he was feeling fine but not very active and had discontinued his GDMT stating he felt fine.  Cardiomyopathy felt to possibly be tachycardia mediated.   Due to low blood pressure GDMT was not resumed.  He was intended to resume Eliquis  but at his EP visit December 2024 noted that he was not taking any of his medications.  Presents to the hospital with 3 days of nonbloody diarrhea with moderate to severe abdominal pain and some nausea, found to have acute cholecystitis on CT with acute choledocholithiasis.  MRCP performed confirming several stones in the common bile duct, plan is for ERCP tomorrow and subsequent cholecystectomy per general surgery.  LFTs trending down.  Patient is a wandering historian but notes no chest discomfort with activity at home.  He does note shortness of breath while mowing his lawn, he will take breaks and resume.  This has not significantly changed since his heart failure appointment in November 2024.  He does note mild lightheadedness with heavy exertion.  No syncope.  No significant change in his cardiovascular status since his last appointment 6 months ago.  There is no EKG available to review at time of consultation.  I have ordered one. Tele: Atrial fibrillation with rates in the 100-110 range.  Occasional PVCs and occasional brief NSVT 5 beats.  Pertinent laboratory studies include sodium 135, potassium 4.1, creatinine 1.35.  Mildly elevated LFTs in the setting of cholecystitis, elevated WBC count 12,400, hemoglobin 15.2, hematocrit 44.7, platelet count 167,000.  INR 1.2.  Most recent lipid panel LDL 108, HDL 51, triglycerides 147.  Most recent hemoglobin A1c from March 2025 is 15%.   Past Medical History:  Diagnosis Date   Chronic combined systolic and diastolic heart failure (HCC)    followed by cardiologist;  a. Echo 03/27/11: EF 50-55%,  inferior hypokinesis, moderate LAE, mild RVE, normal pulmonary pressures. ;   b.  TEE (04/03/11): EF 40-45%, mild MR moderate LAE, no LAA clot, mild RAE;  c.  Echocardiogram (02/2013): EF 60-65%, Gr 2 DD, mildly dilated Ao root (Ao root dimension 39 mm), MAC, mild LAE, normal RVF    Complication of anesthesia    pt unable to tolerate versed  and fentanyl  sedation and required general anesthesia with TEE 02/ 2013   Depression    Diabetes mellitus, type 2 (HCC)    followed by pcp  (02-21-2021 pt stated does not check blood sugar )   Hx of cardiovascular stress test    Lexiscan  Myoview (02/2013): No ischemia or scar, EF 51%, low risk;  cardiac cath 04-21-2016  no CAD, NICM   Malignant neoplasm of urinary bladder (HCC) 12/2020   urologist--- dr Derrick Fling;  oncologist-- dr Dirk Fredericks,  high grade muscle invasion   Morbid obesity (HCC)    NICM (nonischemic cardiomyopathy) (HCC) 04/2016   followed by cardiology;   dx 03/ 2018 per TEE ef 15%;  TEE 04/ 2018 ef 25%;  TEE 07/ 2018 ef 40%;  echo 12/ 2021 ef 55%   OSA (obstructive sleep apnea)    followed by dr t. turner--  no cpap/ bipap:: previously followed  by dr clance sleep study 02-11-2013  very severe osa w/ AHI 108/hr had used cpap until 2017:   repeat sleep study 02-14-2018 in epic severe osa w/ hypoxemia, AHI 46.3/hr titrated 04-15-2018 used cpap for awhile   Persistent atrial fibrillation (HCC) 03/2011   cardiologist--   s/p TEE-DCCV (failed);  Amiodarone  started    Past Surgical History:  Procedure Laterality Date   CARDIOVERSION  04/01/2011   Procedure: CARDIOVERSION;  Surgeon: Lenise Quince, MD;  Location: [facility] ENDOSCOPY;  Service: Cardiovascular;  Laterality: N/A;   CARDIOVERSION  04/03/2011   Procedure: CARDIOVERSION;  Surgeon: Lenise Quince, MD;  Location: [facility] ENDOSCOPY;  Service: Cardiovascular;  Laterality: N/A;   CARDIOVERSION  04/03/2011   Procedure: CARDIOVERSION;  Surgeon: Lenise Quince, MD;  Location: [facility] OR;  Service: Cardiovascular;  Laterality: N/A;   CARDIOVERSION N/A 06/15/2014   Procedure: CARDIOVERSION;  Surgeon: Darlis Eisenmenger, MD;  Location: [facility] ENDOSCOPY;  Service: Cardiovascular;  Laterality: N/A;   CARDIOVERSION N/A 09/08/2016   Procedure: CARDIOVERSION;  Surgeon: Darlis Eisenmenger, MD;  Location: [facility]  ENDOSCOPY;  Service: Cardiovascular;  Laterality: N/A;   I & D EXTREMITY Right 09/19/2015   Procedure: IRRIGATION AND DEBRIDEMENT EXTREMITY;  Surgeon: Arnie Lao, MD;  Location: [facility] OR;  Service: Orthopedics;  Laterality: Right;   RIGHT/LEFT HEART CATH AND CORONARY ANGIOGRAPHY N/A 04/21/2016   Procedure: Right/Left Heart Cath and Coronary Angiography;  Surgeon: Darlis Eisenmenger, MD;  Location: [facility] INVASIVE CV LAB;  Service: Cardiovascular;  Laterality: N/A;   TEE WITHOUT CARDIOVERSION  04/01/2011   Procedure: TRANSESOPHAGEAL ECHOCARDIOGRAM (TEE);  Surgeon: Lenise Quince, MD;  Location: [facility] ENDOSCOPY;  Service: Cardiovascular;  Laterality: N/A;   TEE WITHOUT CARDIOVERSION  04/03/2011   Procedure: TRANSESOPHAGEAL ECHOCARDIOGRAM (TEE);  Surgeon: Lenise Quince, MD;  Location: [facility] ENDOSCOPY;  Service: Cardiovascular;  Laterality: N/A;   TEE WITHOUT CARDIOVERSION  04/03/2011   Procedure: TRANSESOPHAGEAL ECHOCARDIOGRAM (TEE);  Surgeon: Lenise Quince, MD;  Location: [facility] OR;  Service: Cardiovascular;  Laterality: N/A;   TEE WITHOUT CARDIOVERSION N/A 04/24/2016   Procedure: TRANSESOPHAGEAL ECHOCARDIOGRAM (TEE);  Surgeon: Darlis Eisenmenger, MD;  Location: [facility] ENDOSCOPY;  Service: Cardiovascular;  Laterality: N/A;   TEE  WITHOUT CARDIOVERSION N/A 05/26/2016   Procedure: TRANSESOPHAGEAL ECHOCARDIOGRAM (TEE);  Surgeon: Darlis Eisenmenger, MD;  Location: [facility] ENDOSCOPY;  Service: Cardiovascular;  Laterality: N/A;   TEE WITHOUT CARDIOVERSION N/A 09/08/2016   Procedure: TRANSESOPHAGEAL ECHOCARDIOGRAM (TEE);  Surgeon: Darlis Eisenmenger, MD;  Location: [facility] ENDOSCOPY;  Service: Cardiovascular;  Laterality: N/A;   TRANSURETHRAL RESECTION OF BLADDER TUMOR N/A 12/14/2020   Procedure: TRANSURETHRAL RESECTION OF BLADDER TUMOR (TURBT) 2-5 cm/ POST OPERTIVE INSTILLATION OF GEMCITABINE ;  Surgeon: Christina Coyer, MD;  Location: [facility];  Service: Urology;  Laterality: N/A;   TRANSURETHRAL RESECTION OF BLADDER  TUMOR N/A 02/22/2021   Procedure: TRANSURETHRAL RESECTION OF BLADDER TUMOR (TURBT);  Surgeon: Christina Coyer, MD;  Location: [facility];  Service: Urology;  Laterality: N/A;     Home Medications:  Prior to Admission medications   Medication Sig Start Date End Date Taking? Authorizing Provider  insulin  glargine (LANTUS  SOLOSTAR) 100 UNIT/ML Solostar Pen Inject 20 Units into the skin every morning. 07/02/23  Yes Laneta Pintos, MD  Semaglutide ,0.25 or 0.5MG /DOS, 2 MG/3ML SOPN Inject 0.5 mg into the skin once a week. 05/25/23  Yes Laneta Pintos, MD  atorvastatin  (LIPITOR ) 80 MG tablet Take 1 tablet (80 mg total) by mouth every morning. Patient not taking: Reported on 05/18/2023 04/22/23   Laneta Pintos, MD  Blood Glucose Monitoring Suppl (BLOOD GLUCOSE MONITOR SYSTEM) w/Device KIT Test in the morning, at noon, and at bedtime. Patient not taking: Reported on 05/18/2023 02/05/23   Laneta Pintos, MD  Blood Pressure Monitoring (BLOOD PRESSURE KIT) DEVI Check blood pressure daily Patient not taking: Reported on 05/18/2023 02/12/23   Laneta Pintos, MD  ezetimibe  (ZETIA ) 10 MG tablet Take 1 tablet (10 mg total) by mouth every morning. Patient not taking: Reported on 05/18/2023 04/22/23   Laneta Pintos, MD  metFORMIN  (GLUCOPHAGE ) 1000 MG tablet Take 1 tablet (1,000 mg total) by mouth every morning. Patient not taking: Reported on 07/03/2023 04/22/23   Laneta Pintos, MD  oxybutynin  (DITROPAN ) 5 MG tablet Take 1 tablet (5 mg total) by mouth 2 (two) times daily as needed for bladder spasms. Patient not taking: Reported on 05/18/2023 02/24/22   Christina Coyer, MD  rivaroxaban  (XARELTO ) 20 MG TABS tablet Take 1 tablet (20 mg total) by mouth every morning. Patient not taking: Reported on 05/18/2023 04/22/23   Laneta Pintos, MD  tamsulosin  (FLOMAX ) 0.4 MG CAPS capsule Take 1 capsule (0.4 mg total) by mouth daily after supper. Patient not taking: Reported on 05/18/2023 02/24/22   Christina Coyer,  MD    Inpatient Medications: Scheduled Meds:  insulin  aspart  0-15 Units Subcutaneous TID WC   insulin  glargine-yfgn  10 Units Subcutaneous Daily   sodium chloride  flush  3 mL Intravenous Q12H   Continuous Infusions:  sodium chloride  20 mL/hr at 07/03/23 2042   piperacillin-tazobactam (ZOSYN)  IV 3.375 g (07/04/23 0601)   PRN Meds: acetaminophen  **OR** acetaminophen   Allergies:   No Known Allergies  Social History:   Social History   Socioeconomic History   Marital status: Single    Spouse name: Not on file   Number of children: 0   Years of education: 12   Highest education level: Not on file  Occupational History   Occupation: unemployeed  Tobacco Use   Smoking status: Never   Smokeless tobacco: Never  Vaping Use   Vaping status: Never Used  Substance and Sexual Activity   Alcohol use: Yes  Comment: some   Drug use: Not Currently   Sexual activity: Not on file  Other Topics Concern   Not on file  Social History Narrative   Admits that he eats poorly and eats a lot at work.      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   Social Drivers of Health   Financial Resource Strain: High Risk (07/30/2020)   Overall Financial Resource Strain (CARDIA)    Difficulty of Paying Living Expenses: Hard  Food Insecurity: Food Insecurity Present (07/03/2023)   Hunger Vital Sign    Worried About Running Out of Food in the Last Year: Sometimes true    Ran Out of Food in the Last Year: Sometimes true  Transportation Needs: No Transportation Needs (07/03/2023)   PRAPARE - Administrator, Civil Service (Medical): No    Lack of Transportation (Non-Medical): No  Physical Activity: Inactive (07/30/2020)   Exercise Vital Sign    Days of Exercise per Week: 0 days    Minutes of Exercise per Session: 0 min  Stress: Stress Concern Present (07/30/2020)   [employer] of Occupational Health - Occupational Stress Questionnaire    Feeling of Stress : To  some extent  Social Connections: Socially Isolated (07/03/2023)   Social Connection and Isolation Panel [NHANES]    Frequency of Communication with Friends and Family: More than three times a week    Frequency of Social Gatherings with Friends and Family: Never    Attends Religious Services: Never    Database administrator or Organizations: No    Attends Banker Meetings: Never    Marital Status: Never married  Intimate Partner Violence: Not At Risk (07/03/2023)   Humiliation, Afraid, Rape, and Kick questionnaire    Fear of Current or Ex-Partner: No    Emotionally Abused: No    Physically Abused: No    Sexually Abused: No    Family History:    Family History  Problem Relation Age of Onset   Diabetes Father        died in his 80s   Hypertension Father    Seizures Mother        died in her 38s   Hypertension Sister    Hypertension Paternal Grandfather    Heart attack Neg Hx    Stroke Neg Hx      ROS:  Please see the history of present illness.   All other ROS reviewed and negative.     Physical Exam/Data:   Vitals:   07/03/23 1707 07/03/23 2007 07/04/23 0607 07/04/23 0841  BP: (!) 143/90 139/87 129/81 (!) 146/86  Pulse: 73 82 95 (!) 103  Resp: 17 16 16 17   Temp: 97.7 F (36.5 C) 97.6 F (36.4 C) 98.7 F (37.1 C) 97.9 F (36.6 C)  TempSrc: Oral Oral Oral Oral  SpO2: 97% 99% 94% 98%  Weight:      Height:        Intake/Output Summary (Last 24 hours) at 07/04/2023 1219 Last data filed at 07/04/2023 1000 Gross per 24 hour  Intake 860.55 ml  Output 625 ml  Net 235.55 ml      07/03/2023   11:17 AM 04/21/2023    2:47 PM 03/18/2023    2:12 PM  Last 3 Weights  Weight (lbs) 240 lb 240 lb 234 lb  Weight (kg) 108.863 kg 108.863 kg 106.142 kg     Body mass index is 30.81 kg/m.  General:  Well nourished, well  developed, in no acute distress HEENT: normal Neck: no JVD Vascular: No carotid bruits; Distal pulses 2+ bilaterally Cardiac:  normal S1, S2; RRR;  no murmur  Lungs:  clear to auscultation bilaterally, no wheezing, rhonchi or rales  Abd: soft, nontender, no hepatomegaly  Ext: no edema Musculoskeletal:  No deformities, BUE and BLE strength normal and equal Skin: warm and dry, jaundiced Neuro:  CNs 2-12 intact, no focal abnormalities noted Psych:  Normal affect   EKG:  The EKG was personally reviewed and demonstrates:  none available, pending Telemetry:  Telemetry was personally reviewed and demonstrates: A-fib rates 100-110  Relevant CV Studies: Echo pending  Laboratory Data:  High Sensitivity Troponin:  No results for input(s): "TROPONINIHS" in the last 720 hours.   Chemistry Recent Labs  Lab 07/03/23 1119 07/04/23 0304  NA 133* 135  K 3.3* 4.1  CL 100 100  CO2 21* 25  GLUCOSE 185* 97  BUN 14 12  CREATININE 1.36* 1.35*  CALCIUM  9.2 9.0  GFRNONAA 56* 57*  ANIONGAP 12 10    Recent Labs  Lab 07/03/23 1119 07/04/23 0304  PROT 7.2 6.3*  ALBUMIN 3.5 2.9*  AST 142* 80*  ALT 215* 165*  ALKPHOS 188* 170*  BILITOT 8.4* 6.6*   Lipids No results for input(s): "CHOL", "TRIG", "HDL", "LABVLDL", "LDLCALC", "CHOLHDL" in the last 168 hours.  Hematology Recent Labs  Lab 07/03/23 1119 07/04/23 0304  WBC 15.4* 12.4*  RBC 5.44 5.21  HGB 15.9 15.2  HCT 46.5 44.7  MCV 85.5 85.8  MCH 29.2 29.2  MCHC 34.2 34.0  RDW 12.9 13.2  PLT 179 167   Thyroid  No results for input(s): "TSH", "FREET4" in the last 168 hours.  BNPNo results for input(s): "BNP", "PROBNP" in the last 168 hours.  DDimer No results for input(s): "DDIMER" in the last 168 hours.   Radiology/Studies:  MR ABDOMEN MRCP W WO CONTAST Result Date: 07/04/2023 CLINICAL DATA:  Gallstones. Abdominal pain. History bladder cancer. EXAM: MRI ABDOMEN WITHOUT AND WITH CONTRAST (INCLUDING MRCP) TECHNIQUE: Multiplanar multisequence MR imaging of the abdomen was performed both before and after the administration of intravenous contrast. Heavily T2-weighted images of the  biliary and pancreatic ducts were obtained, and three-dimensional MRCP images were rendered by post processing. CONTRAST:  10mL GADAVIST GADOBUTROL 1 MMOL/ML IV SOLN COMPARISON:  CT AP 07/03/2023 FINDINGS: Lower chest: No acute abnormality. Hepatobiliary: Mild liver steatosis. Cyst within segment 4 B measures 2.4 cm, image 25/19. No enhancing liver lesions. Stones identified within the dependent portion of the gallbladder measuring up to 5.1 mm. Gallbladder wall thickening and edema measures up to 9 mm, image 13/4. The common bile duct measures 8 mm in maximum dimension. Multiple stones within the distal common bile duct are noted (at least 5). The largest of these measures 5 mm, image 19/4. Pancreas: No mass, inflammatory changes, or other parenchymal abnormality identified. Spleen: Normal in size without suspicious abnormality. Chronic areas of scarring within the splenic parenchyma identified Adrenals/Urinary Tract: Right adrenal gland is normal. Left adrenal nodule measures 1 cm. Small myelolipoma within the left adrenal gland measures 1.1 cm, image 30/4, image 16/8. No follow-up imaging recommended. No hydronephrosis. Mild left-sided perinephric soft tissue stranding. Upper pole left kidney cysts containing thin, internal area of hairline septation measures 2.8 cm and is compatible with a benign Bosniak class 2 cyst. No follow-up imaging recommended. Stomach/Bowel: The stomach appears normal. No pathologic dilatation of the large or small bowel loops. Vascular/Lymphatic: No pathologically enlarged lymph nodes identified. No  abdominal aortic aneurysm demonstrated. Other:  No free fluid or fluid collections. Musculoskeletal: Choose 1 IMPRESSION: 1. Cholelithiasis with gallbladder wall thickening and edema compatible with acute cholecystitis. 2. Multiple stones within the distal common bile duct (at least 5). The largest of these measures 5 mm. 3. Mild liver steatosis. 4. Small left adrenal myelolipoma. No  follow-up imaging recommended. 5. Mild left-sided perinephric soft tissue stranding. Correlate for any clinical signs or symptoms of pyelonephritis. Electronically Signed   By: Kimberley Penman M.D.   On: 07/04/2023 05:24   CT ABDOMEN PELVIS W CONTRAST Result Date: 07/03/2023 CLINICAL DATA:  Abdominal pain, acute, nonlocalized. History of urinary bladder cancer. * Tracking Code: BO * EXAM: CT ABDOMEN AND PELVIS WITH CONTRAST TECHNIQUE: Multidetector CT imaging of the abdomen and pelvis was performed using the standard protocol following bolus administration of intravenous contrast. RADIATION DOSE REDUCTION: This exam was performed according to the departmental dose-optimization program which includes automated exposure control, adjustment of the mA and/or kV according to patient size and/or use of iterative reconstruction technique. CONTRAST:  75mL OMNIPAQUE  IOHEXOL  350 MG/ML SOLN COMPARISON:  CT scan chest, abdomen and pelvis from 06/08/2023. FINDINGS: Lower chest: There are subpleural atelectatic changes in the visualized lung bases. No overt consolidation. No pleural effusion. The heart is normal in size. No pericardial effusion. Hepatobiliary: The liver is normal in size. Non-cirrhotic configuration. No suspicious mass. These is mild diffuse hepatic steatosis. There is a subcapsular simple cyst measuring 2.0 x 2.5 cm in the left hepatic lobe, segment 4A. The gallbladder is distended and exhibit mild diffuse gallbladder wall thickening and mild to moderate pericholecystic fat stranding. There are several, sub 5 mm calcified gallstones. No intrahepatic bile duct dilation. The extrahepatic bile duct is also not dilated however, there are at least 4, 2-5 mm sized calculi in the distal extrahepatic bile duct. Findings favor acute cholecystitis and choledocholithiasis. Pancreas: Unremarkable. No pancreatic ductal dilatation or surrounding inflammatory changes. Spleen: Within normal limits. No focal lesion.  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 Unremarkable right adrenal gland. No suspicious renal mass. There is a 1.9 x 2.7 cm cyst arising from the left kidney upper pole which exhibit thin intervening partially calcified septum. No discrete mural nodule. No nephroureterolithiasis or obstructive uropathy on either side. Focal scarring noted in the right kidney lower pole, laterally. Urinary bladder is under distended, precluding optimal assessment. However, no large mass or stones identified. No perivesical fat stranding. Stomach/Bowel: No disproportionate dilation of the small or large bowel loops. No evidence of abnormal bowel wall thickening or inflammatory changes. The appendix is unremarkable. There is fecalization of several small bowel loops, which is nonspecific but can be seen with slow transit time/incompetent ileocecal valve. No bowel obstruction. Vascular/Lymphatic: No ascites or pneumoperitoneum. There is a stable oval 9 x 13 mm right retrocrural hypoattenuating lesion, which is tubular and favored to represent probable thrombosed vascular structure. No abdominal or pelvic lymphadenopathy, by size criteria. No aneurysmal dilation of the major abdominal arteries. There are mild peripheral atherosclerotic vascular calcifications of the aorta and its major branches. Reproductive: Normal size prostate. Symmetric seminal vesicles. Other: There are bilateral small fat containing inguinal hernias. The soft tissues and abdominal wall are otherwise unremarkable. Musculoskeletal: No suspicious osseous lesions. There are mild - moderate multilevel degenerative changes in the visualized spine. IMPRESSION: 1. Findings favor acute cholecystitis and choledocholithiasis. 2. No metastatic bladder carcinoma in the abdomen or pelvis. 3. Multiple other nonacute observations, as described  above. Aortic Atherosclerosis (ICD10-I70.0). Electronically Signed   By: Beula Brunswick M.D.   On:  07/03/2023 14:52     Assessment and Plan:   #Preoperative risk stratification - Given patient's multiple comorbidities, he is likely high risk for general anesthesia, primarily based on his history of permanent atrial fibrillation not on rate controlling agents, untreated obstructive sleep apnea, and stable class II heart failure symptoms.  However we will obtain EKG and echocardiogram to further risk stratify.  He appears euvolemic, diuresis not required at this time and he does not appear to have recent decompensated heart failure.  Final preoperative risk stratification to follow echocardiogram results.  If EKG and echocardiogram are unremarkable and stable from prior, no further cardiovascular testing will be required as the patient has no significant change in cardiovascular symptoms and no angina.  #PVCs #NSVT - low dose BB may assist in afib rate control as well as PVCs/NSVT  #HFrecEF #NICM - NYHA II - I have ordered updated echo to ensure no changes to LV function while off of GDMT and while remaining in Afib. - not currently on any GDMT  #Atrial fibrillation, permanent - not on anticoagulation due to medication nonadherence, would not resume now due to upcoming procedures.  #Acute cholecystitis/choledocholithiasis #DM2 #HLD -statin on hold due to transaminitis.   #OSA not on CPAP  Risk Assessment/Risk Scores:        [place]  Heart Association (NYHA) Functional Class NYHA Class II  CHA2DS2-VASc Score = 3   This indicates a 3.2% annual risk of stroke. The patient's score is based upon: CHF History: 1 HTN History: 0 Diabetes History: 1 Stroke History: 0 Vascular Disease History: 0 Age Score: 1 Gender Score: 0         For questions or updates, please contact [facility] Please consult [email or web address] for contact info under    Signed, Euell Herrlich, MD  07/04/2023 12:19 PM

## 2023-07-04 NOTE — Progress Notes (Signed)
 PROGRESS NOTE    Darius [name]  ZOX:[id number] DOB: Jun 09, 1955 DOA: 07/03/2023 PCP: Laneta Pintos, MD   Brief Narrative:  HPI: Darius [name] is a 68 y.o. male with medical history significant of diabetes, CKD 3B, atrial fibrillation, CHF, OSA, obesity, bladder cancer presenting with abdominal pain.   Patient reports 3 days of nonbloody diarrhea.  He started having moderate to severe abdominal pain last night.  Reports some nausea this morning.   Denies fevers, chills, chest pain, shortness of breath, constipation, vomiting.   ED Course: Vital signs in the ED notable for blood pressure in the 140s-150s systolic.  Lab workup included CMP with sodium 133, potassium 3.3, bicarb 21, creatinine 1.36, glucose 185, AST 142, ALT 215, alk phos 188, T. bili 8.4.  CBC with leukocytosis to 15.4.  Lipase normal.  Urinalysis with glucose, bilirubin, ketones, protein, leukocytes, bacteria.  Urine culture pending.  CT abdomen pelvis showed changes consistent with acute cholecystitis and acute choledocholithiasis, no evidence of static disease in the setting of prior bladder cancer.  No other acute normality.   General surgery consulted and will see the patient and requested GI consultation who will also see the patient and recommending MRCP.  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  > LFTs elevated with AST 142, ALT 215, alk phos 188, T. bili 8.4. Leukocytosis to 15.4. > General Surgery and gastroenterology consulted.  Seen by GI, MRCP  obtained which confirmed several stones in CBD.  Patient remains on Zosyn.  Symptoms have improved.  Waiting general surgery primary assessment and reevaluation by GI.  Will likely need ERCP followed by cholecystectomy.  LFTs and total bilirubin improving.   Diabetes melitis type II, POA: > 20 units daily, semaglutide  at home along with metformin  but currently on 10 units of Lantus  and SSI and blood sugar control.   CKD 3B ruled out, CKD stage IIIa ruled in: Patient's baseline creatinine appears to be between 1.3-1.5 making it CKD stage IIIa, currently at baseline.  Hypokalemia: Resolved.  Atrial fibrillation - Rate is controlled, not on any rate control medications at home, Xarelto  on hold.   CHF with recovered ejection fraction > Last echo was 2023 with EF 50 of 80%, normal diastolic function, normal RV function. -Not currently taking any medications for this   History of bladder cancer - Noted   Obesity class I: Weight loss and dietary modification counseled.   OSA - Not on CPAP per chart   Hyperlipidemia: Holding statin due to elevated LFTs.  BPH: Resume Flomax .  DVT prophylaxis: SCDs Start: 07/03/23 1559   Code Status: Full Code  Family Communication:  None present at bedside.  Plan of care discussed with patient in length and he/she verbalized understanding and agreed with it.  Status is: Inpatient Remains inpatient appropriate because: Needs ERCP followed by cholecystectomy.   Estimated body mass index is 30.81 kg/m as calculated from the following:   Height as of this encounter: 6\' 2"  (1.88 m).   Weight as of this encounter: 108.9 kg.  Nutritional Assessment: Body mass index is 30.81 kg/m.Darius [name] Seen by dietician.  I agree with the assessment and plan as outlined below: Nutrition Status:        . Skin Assessment: I have examined the patient's skin and I agree with the wound assessment as performed by the wound care RN as outlined below:    Consultants:   Surgery and GI  Procedures:  As above  Antimicrobials:  Anti-infectives (From admission, onward)    Start     Dose/Rate Route Frequency Ordered Stop   07/03/23 2300  piperacillin-tazobactam (ZOSYN) IVPB 3.375 g        3.375 g 12.5 mL/hr over 240 Minutes Intravenous Every 8 hours 07/03/23 1612     07/03/23 1530  piperacillin-tazobactam (ZOSYN) IVPB 3.375 g        3.375 g 100 mL/hr over 30 Minutes Intravenous  Once 07/03/23 1515 07/03/23 1559         Subjective: Patient seen and examined, he says that his pain is 1 out of 10.  Much better than yesterday.  No nausea or any other complaint.  Objective: Vitals:   07/03/23 1707 07/03/23 2007 07/04/23 0607 07/04/23 0841  BP: (!) 143/90 139/87 129/81 (!) 146/86  Pulse: 73 82 95 (!) 103  Resp: 17 16 16 17   Temp: 97.7 F (36.5 C) 97.6 F (36.4 C) 98.7 F (37.1 C) 97.9 F (36.6 C)  TempSrc: Oral Oral Oral Oral  SpO2: 97% 99% 94% 98%  Weight:      Height:        Intake/Output Summary (Last 24 hours) at 07/04/2023 0925 Last data filed at 07/04/2023 0601 Gross per 24 hour  Intake 860.55 ml  Output 525 ml  Net 335.55 ml   Filed Weights   07/03/23 1117  Weight: 108.9 kg    Examination:  General exam: Appears calm and comfortable, obese Respiratory system: Clear to auscultation. Respiratory effort normal. Cardiovascular system: S1 & S2 heard, RRR. No JVD, murmurs, rubs, gallops or clicks. No pedal edema. Gastrointestinal system: Abdomen is nondistended, soft and epigastric and right upper quadrant tenderness with positive Murphy sign.  No organomegaly or masses felt. Normal bowel sounds heard. Central nervous system: Alert and oriented. No focal neurological deficits. Extremities: Symmetric 5 x 5 power. Skin: No rashes, lesions or ulcers Psychiatry: Judgement and insight appear normal. Mood & affect appropriate.    Data Reviewed: I have personally reviewed following labs and imaging studies  CBC: Recent Labs  Lab  07/03/23 1119 07/04/23 0304  WBC 15.4* 12.4*  HGB 15.9 15.2  HCT 46.5 44.7  MCV 85.5 85.8  PLT 179 167   Basic Metabolic Panel: Recent Labs  Lab 07/03/23 1119 07/04/23 0304  NA 133* 135  K 3.3* 4.1  CL 100 100  CO2 21* 25  GLUCOSE 185* 97  BUN 14 12  CREATININE 1.36* 1.35*  CALCIUM  9.2 9.0   GFR: Estimated Creatinine Clearance: 67.9 mL/min (A) (by C-G formula based on SCr of 1.35 mg/dL (H)). Liver Function Tests: Recent Labs  Lab 07/03/23 1119 07/04/23 0304  AST 142* 80*  ALT 215* 165*  ALKPHOS 188* 170*  BILITOT 8.4* 6.6*  PROT 7.2 6.3*  ALBUMIN 3.5 2.9*   Recent Labs  Lab 07/03/23 1119  LIPASE 34   No results for input(s): "AMMONIA" in the last 168 hours. Coagulation Profile: Recent Labs  Lab 07/04/23 0304  INR 1.2   Cardiac Enzymes: No results for input(s): "CKTOTAL", "CKMB", "CKMBINDEX", "TROPONINI" in the last 168  hours. BNP (last 3 results) No results for input(s): "PROBNP" in the last 8760 hours. HbA1C: No results for input(s): "HGBA1C" in the last 72 hours. CBG: Recent Labs  Lab 07/03/23 1838 07/03/23 2220 07/04/23 0839  GLUCAP 125* 147* 104*   Lipid Profile: No results for input(s): "CHOL", "HDL", "LDLCALC", "TRIG", "CHOLHDL", "LDLDIRECT" in the last 72 hours. Thyroid  Function Tests: No results for input(s): "TSH", "T4TOTAL", "FREET4", "T3FREE", "THYROIDAB" in the last 72 hours. Anemia Panel: No results for input(s): "VITAMINB12", "FOLATE", "FERRITIN", "TIBC", "IRON", "RETICCTPCT" in the last 72 hours. Sepsis Labs: No results for input(s): "PROCALCITON", "LATICACIDVEN" in the last 168 hours.  No results found for this or any previous visit (from the past 240 hours).   Radiology Studies: MR ABDOMEN MRCP W WO CONTAST Result Date: 07/04/2023 CLINICAL DATA:  Gallstones. Abdominal pain. History bladder cancer. EXAM: MRI ABDOMEN WITHOUT AND WITH CONTRAST (INCLUDING MRCP) TECHNIQUE: Multiplanar multisequence MR imaging of the abdomen was  performed both before and after the administration of intravenous contrast. Heavily T2-weighted images of the biliary and pancreatic ducts were obtained, and three-dimensional MRCP images were rendered by post processing. CONTRAST:  10mL GADAVIST GADOBUTROL 1 MMOL/ML IV SOLN COMPARISON:  CT AP 07/03/2023 FINDINGS: Lower chest: No acute abnormality. Hepatobiliary: Mild liver steatosis. Cyst within segment 4 B measures 2.4 cm, image 25/19. No enhancing liver lesions. Stones identified within the dependent portion of the gallbladder measuring up to 5.1 mm. Gallbladder wall thickening and edema measures up to 9 mm, image 13/4. The common bile duct measures 8 mm in maximum dimension. Multiple stones within the distal common bile duct are noted (at least 5). The largest of these measures 5 mm, image 19/4. Pancreas: No mass, inflammatory changes, or other parenchymal abnormality identified. Spleen: Normal in size without suspicious abnormality. Chronic areas of scarring within the splenic parenchyma identified Adrenals/Urinary Tract: Right adrenal gland is normal. Left adrenal nodule measures 1 cm. Small myelolipoma within the left adrenal gland measures 1.1 cm, image 30/4, image 16/8. No follow-up imaging recommended. No hydronephrosis. Mild left-sided perinephric soft tissue stranding. Upper pole left kidney cysts containing thin, internal area of hairline septation measures 2.8 cm and is compatible with a benign Bosniak class 2 cyst. No follow-up imaging recommended. Stomach/Bowel: The stomach appears normal. No pathologic dilatation of the large or small bowel loops. Vascular/Lymphatic: No pathologically enlarged lymph nodes identified. No abdominal aortic aneurysm demonstrated. Other:  No free fluid or fluid collections. Musculoskeletal: Choose 1 IMPRESSION: 1. Cholelithiasis with gallbladder wall thickening and edema compatible with acute cholecystitis. 2. Multiple stones within the distal common bile duct (at least  5). The largest of these measures 5 mm. 3. Mild liver steatosis. 4. Small left adrenal myelolipoma. No follow-up imaging recommended. 5. Mild left-sided perinephric soft tissue stranding. Correlate for any clinical signs or symptoms of pyelonephritis. Electronically Signed   By: Kimberley Penman M.D.   On: 07/04/2023 05:24   CT ABDOMEN PELVIS W CONTRAST Result Date: 07/03/2023 CLINICAL DATA:  Abdominal pain, acute, nonlocalized. History of urinary bladder cancer. * Tracking Code: BO * EXAM: CT ABDOMEN AND PELVIS WITH CONTRAST TECHNIQUE: Multidetector CT imaging of the abdomen and pelvis was performed using the standard protocol following bolus administration of intravenous contrast. RADIATION DOSE REDUCTION: This exam was performed according to the departmental dose-optimization program which includes automated exposure control, adjustment of the mA and/or kV according to patient size and/or use of iterative reconstruction technique. CONTRAST:  75mL OMNIPAQUE  IOHEXOL  350 MG/ML SOLN COMPARISON:  CT scan chest,  abdomen and pelvis from 06/08/2023. FINDINGS: Lower chest: There are subpleural atelectatic changes in the visualized lung bases. No overt consolidation. No pleural effusion. The heart is normal in size. No pericardial effusion. Hepatobiliary: The liver is normal in size. Non-cirrhotic configuration. No suspicious mass. These is mild diffuse hepatic steatosis. There is a subcapsular simple cyst measuring 2.0 x 2.5 cm in the left hepatic lobe, segment 4A. The gallbladder is distended and exhibit mild diffuse gallbladder wall thickening and mild to moderate pericholecystic fat stranding. There are several, sub 5 mm calcified gallstones. No intrahepatic bile duct dilation. The extrahepatic bile duct is also not dilated however, there are at least 4, 2-5 mm sized calculi in the distal extrahepatic bile duct. Findings favor acute cholecystitis and choledocholithiasis. Pancreas: Unremarkable. No pancreatic ductal  dilatation or surrounding inflammatory changes. Spleen: Within normal limits. No focal lesion. 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 Unremarkable right adrenal gland. No suspicious renal mass. There is a 1.9 x 2.7 cm cyst arising from the left kidney upper pole which exhibit thin intervening partially calcified septum. No discrete mural nodule. No nephroureterolithiasis or obstructive uropathy on either side. Focal scarring noted in the right kidney lower pole, laterally. Urinary bladder is under distended, precluding optimal assessment. However, no large mass or stones identified. No perivesical fat stranding. Stomach/Bowel: No disproportionate dilation of the small or large bowel loops. No evidence of abnormal bowel wall thickening or inflammatory changes. The appendix is unremarkable. There is fecalization of several small bowel loops, which is nonspecific but can be seen with slow transit time/incompetent ileocecal valve. No bowel obstruction. Vascular/Lymphatic: No ascites or pneumoperitoneum. There is a stable oval 9 x 13 mm right retrocrural hypoattenuating lesion, which is tubular and favored to represent probable thrombosed vascular structure. No abdominal or pelvic lymphadenopathy, by size criteria. No aneurysmal dilation of the major abdominal arteries. There are mild peripheral atherosclerotic vascular calcifications of the aorta and its major branches. Reproductive: Normal size prostate. Symmetric seminal vesicles. Other: There are bilateral small fat containing inguinal hernias. The soft tissues and abdominal wall are otherwise unremarkable. Musculoskeletal: No suspicious osseous lesions. There are mild - moderate multilevel degenerative changes in the visualized spine. IMPRESSION: 1. Findings favor acute cholecystitis and choledocholithiasis. 2. No metastatic bladder carcinoma in the abdomen or pelvis. 3. Multiple other nonacute observations, as described above.  Aortic Atherosclerosis (ICD10-I70.0). Electronically Signed   By: Beula Brunswick M.D.   On: 07/03/2023 14:52    Scheduled Meds:  insulin  aspart  0-15 Units Subcutaneous TID WC   insulin  glargine-yfgn  10 Units Subcutaneous Daily   sodium chloride  flush  3 mL Intravenous Q12H   Continuous Infusions:  sodium chloride  20 mL/hr at 07/03/23 2042   piperacillin-tazobactam (ZOSYN)  IV 3.375 g (07/04/23 0601)     LOS: 1 day   Modena Andes, MD [facility]  07/04/2023, 9:25 AM   *Please note that this is a verbal dictation therefore any spelling or grammatical errors are due to the "[employer] One" system interpretation.  Please page via Amion and do not message via secure chat for urgent patient care matters. Secure chat can be used for non urgent patient care matters.  How to contact the [facility] Attending or Consulting provider 7A - 7P or covering provider during after hours 7P -7A, for this patient?  Check the care team in [facility] and look for a) attending/consulting [facility] provider listed and b) the [facility] team listed. Page or secure  chat 7A-7P. Log into [email or web address] and use [facility] universal password to access. If you do not have the password, please contact the hospital operator. Locate the [facility] provider you are looking for under [facility] and page to a number that you can be directly reached. If you still have difficulty reaching the provider, please page the [facility] (Director on Call) for the Hospitalists listed on amion for assistance.

## 2023-07-04 NOTE — Progress Notes (Signed)
 Echocardiogram 2D Echocardiogram has been performed.  Darius [name] 07/04/2023, 2:27 PM

## 2023-07-04 NOTE — Progress Notes (Signed)
 [facility] ROUNDING NOTE   Subjective: Patient feeling well.  No abdominal pain, nausea/vomiting overnight.  Hungry.  No fevers/chills. MRCP confirmed choledocholithiasis/cholecystitis.  Liver enzymes/tbili downtrending   Objective: Vital signs in last 24 hours: Temp:  [97.6 F (36.4 C)-98.7 F (37.1 C)] 97.9 F (36.6 C) (05/24 0841) Pulse Rate:  [66-103] 103 (05/24 0841) Resp:  [16-22] 17 (05/24 0841) BP: (129-153)/(81-102) 146/86 (05/24 0841) SpO2:  [92 %-99 %] 98 % (05/24 0841) Weight:  [108.9 kg] 108.9 kg (05/23 1117) Last BM Date : 07/02/23 General: NAD, Pleasant Caucasian male Lungs:  CTA b/l, no w/r/r Heart:  RRR, no m/r/g Abdomen:  Soft, mild TTP in the RUQ, neg Murphy's, ND, +BS Ext:  No c/c/e    Intake/Output from previous day: 05/23 0701 - 05/24 0700 In: 860.6 [P.O.:720; I.V.:90.6; IV Piggyback:50] Out: 525 [Urine:525] Intake/Output this shift: No intake/output data recorded.   Lab Results: Recent Labs    07/03/23 1119 07/04/23 0304  WBC 15.4* 12.4*  HGB 15.9 15.2  PLT 179 167  MCV 85.5 85.8   BMET Recent Labs    07/03/23 1119 07/04/23 0304  NA 133* 135  K 3.3* 4.1  CL 100 100  CO2 21* 25  GLUCOSE 185* 97  BUN 14 12  CREATININE 1.36* 1.35*  CALCIUM  9.2 9.0   LFT Recent Labs    07/03/23 1119 07/04/23 0304  PROT 7.2 6.3*  ALBUMIN 3.5 2.9*  AST 142* 80*  ALT 215* 165*  ALKPHOS 188* 170*  BILITOT 8.4* 6.6*   PT/INR Recent Labs    07/04/23 0304  INR 1.2      Imaging/Other results: MR ABDOMEN MRCP W WO CONTAST Result Date: 07/04/2023 CLINICAL DATA:  Gallstones. Abdominal pain. History bladder cancer. EXAM: MRI ABDOMEN WITHOUT AND WITH CONTRAST (INCLUDING MRCP) TECHNIQUE: Multiplanar multisequence MR imaging of the abdomen was performed both before and after the administration of intravenous contrast. Heavily T2-weighted images of the biliary and pancreatic ducts were obtained, and three-dimensional MRCP images were  rendered by post processing. CONTRAST:  10mL GADAVIST GADOBUTROL 1 MMOL/ML IV SOLN COMPARISON:  CT AP 07/03/2023 FINDINGS: Lower chest: No acute abnormality. Hepatobiliary: Mild liver steatosis. Cyst within segment 4 B measures 2.4 cm, image 25/19. No enhancing liver lesions. Stones identified within the dependent portion of the gallbladder measuring up to 5.1 mm. Gallbladder wall thickening and edema measures up to 9 mm, image 13/4. The common bile duct measures 8 mm in maximum dimension. Multiple stones within the distal common bile duct are noted (at least 5). The largest of these measures 5 mm, image 19/4. Pancreas: No mass, inflammatory changes, or other parenchymal abnormality identified. Spleen: Normal in size without suspicious abnormality. Chronic areas of scarring within the splenic parenchyma identified Adrenals/Urinary Tract: Right adrenal gland is normal. Left adrenal nodule measures 1 cm. Small myelolipoma within the left adrenal gland measures 1.1 cm, image 30/4, image 16/8. No follow-up imaging recommended. No hydronephrosis. Mild left-sided perinephric soft tissue stranding. Upper pole left kidney cysts containing thin, internal area of hairline septation measures 2.8 cm and is compatible with a benign Bosniak class 2 cyst. No follow-up imaging recommended. Stomach/Bowel: The stomach appears normal. No pathologic dilatation of the large or small bowel loops. Vascular/Lymphatic: No pathologically enlarged lymph nodes identified. No abdominal aortic aneurysm demonstrated. Other:  No free fluid or fluid collections. Musculoskeletal: Choose 1 IMPRESSION: 1. Cholelithiasis with gallbladder wall thickening and edema compatible with acute cholecystitis. 2. Multiple stones within the distal common bile duct (at least  5). The largest of these measures 5 mm. 3. Mild liver steatosis. 4. Small left adrenal myelolipoma. No follow-up imaging recommended. 5. Mild left-sided perinephric soft tissue stranding.  Correlate for any clinical signs or symptoms of pyelonephritis. Electronically Signed   By: Kimberley Penman M.D.   On: 07/04/2023 05:24   CT ABDOMEN PELVIS W CONTRAST Result Date: 07/03/2023 CLINICAL DATA:  Abdominal pain, acute, nonlocalized. History of urinary bladder cancer. * Tracking Code: BO * EXAM: CT ABDOMEN AND PELVIS WITH CONTRAST TECHNIQUE: Multidetector CT imaging of the abdomen and pelvis was performed using the standard protocol following bolus administration of intravenous contrast. RADIATION DOSE REDUCTION: This exam was performed according to the departmental dose-optimization program which includes automated exposure control, adjustment of the mA and/or kV according to patient size and/or use of iterative reconstruction technique. CONTRAST:  75mL OMNIPAQUE  IOHEXOL  350 MG/ML SOLN COMPARISON:  CT scan chest, abdomen and pelvis from 06/08/2023. FINDINGS: Lower chest: There are subpleural atelectatic changes in the visualized lung bases. No overt consolidation. No pleural effusion. The heart is normal in size. No pericardial effusion. Hepatobiliary: The liver is normal in size. Non-cirrhotic configuration. No suspicious mass. These is mild diffuse hepatic steatosis. There is a subcapsular simple cyst measuring 2.0 x 2.5 cm in the left hepatic lobe, segment 4A. The gallbladder is distended and exhibit mild diffuse gallbladder wall thickening and mild to moderate pericholecystic fat stranding. There are several, sub 5 mm calcified gallstones. No intrahepatic bile duct dilation. The extrahepatic bile duct is also not dilated however, there are at least 4, 2-5 mm sized calculi in the distal extrahepatic bile duct. Findings favor acute cholecystitis and choledocholithiasis. Pancreas: Unremarkable. No pancreatic ductal dilatation or surrounding inflammatory changes. Spleen: Within normal limits. No focal lesion. 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 Unremarkable  right adrenal gland. No suspicious renal mass. There is a 1.9 x 2.7 cm cyst arising from the left kidney upper pole which exhibit thin intervening partially calcified septum. No discrete mural nodule. No nephroureterolithiasis or obstructive uropathy on either side. Focal scarring noted in the right kidney lower pole, laterally. Urinary bladder is under distended, precluding optimal assessment. However, no large mass or stones identified. No perivesical fat stranding. Stomach/Bowel: No disproportionate dilation of the small or large bowel loops. No evidence of abnormal bowel wall thickening or inflammatory changes. The appendix is unremarkable. There is fecalization of several small bowel loops, which is nonspecific but can be seen with slow transit time/incompetent ileocecal valve. No bowel obstruction. Vascular/Lymphatic: No ascites or pneumoperitoneum. There is a stable oval 9 x 13 mm right retrocrural hypoattenuating lesion, which is tubular and favored to represent probable thrombosed vascular structure. No abdominal or pelvic lymphadenopathy, by size criteria. No aneurysmal dilation of the major abdominal arteries. There are mild peripheral atherosclerotic vascular calcifications of the aorta and its major branches. Reproductive: Normal size prostate. Symmetric seminal vesicles. Other: There are bilateral small fat containing inguinal hernias. The soft tissues and abdominal wall are otherwise unremarkable. Musculoskeletal: No suspicious osseous lesions. There are mild - moderate multilevel degenerative changes in the visualized spine. IMPRESSION: 1. Findings favor acute cholecystitis and choledocholithiasis. 2. No metastatic bladder carcinoma in the abdomen or pelvis. 3. Multiple other nonacute observations, as described above. Aortic Atherosclerosis (ICD10-I70.0). Electronically Signed   By: Beula Brunswick M.D.   On: 07/03/2023 14:52      Assessment and Plan:  68 year old male with history of A-fib,  DM, history of  bladder cancer with abrupt onset abdominal pain, diarrhea and nausea.  Diarrhea has resolved.  Found to have elevated liver enzymes in an obstructed pattern with tbili 8 , WBC 15, CT with cholelithiasis, suspected cholecystitis, choledocholithiasis with normal caliber CBD.  MRCP confirmed choledocholithiasis and also showed findings suspicious for cholecystitis. He is currently asymptomatic with no abdominal pain, but is tender to palpation.  WBC improved to 12 from 15 on Zosyn.  Liver enzymes and tbili downtrending. Cardiology evaluated patient.  Planning on TTE to further risk stratify  Choledocholithiasis - Will tentatively plan ERCP tomorrow with Dr. Brice Campi pending TTE results - Ok to advance diet today, NPO after midnight tomorrow  Cholecystitis - Surgery following, planning for cholecystectomy this admission following cardiology clearance - Continue empiric Zosyn  History of A-fib - Currently not on anticoagulation or rate control currently - History of medical nonadherence - Asymptomatic with rates in 90-100s - Appreciate cardiology evaluation      Elois Hair, MD  07/04/2023, 10:26 AM [facility]

## 2023-07-05 DIAGNOSIS — K810 Acute cholecystitis: Secondary | ICD-10-CM | POA: Diagnosis not present

## 2023-07-05 DIAGNOSIS — D72829 Elevated white blood cell count, unspecified: Secondary | ICD-10-CM | POA: Diagnosis not present

## 2023-07-05 DIAGNOSIS — K8050 Calculus of bile duct without cholangitis or cholecystitis without obstruction: Secondary | ICD-10-CM | POA: Diagnosis not present

## 2023-07-05 LAB — COMPREHENSIVE METABOLIC PANEL WITH GFR
ALT: 112 U/L — ABNORMAL HIGH (ref 0–44)
AST: 44 U/L — ABNORMAL HIGH (ref 15–41)
Albumin: 2.6 g/dL — ABNORMAL LOW (ref 3.5–5.0)
Alkaline Phosphatase: 181 U/L — ABNORMAL HIGH (ref 38–126)
Anion gap: 9 (ref 5–15)
BUN: 13 mg/dL (ref 8–23)
CO2: 26 mmol/L (ref 22–32)
Calcium: 8.5 mg/dL — ABNORMAL LOW (ref 8.9–10.3)
Chloride: 99 mmol/L (ref 98–111)
Creatinine, Ser: 1.26 mg/dL — ABNORMAL HIGH (ref 0.61–1.24)
GFR, Estimated: >60 mL/min (ref >60)
Glucose, Bld: 121 mg/dL — ABNORMAL HIGH (ref 70–99)
Potassium: 4 mmol/L (ref 3.5–5.1)
Sodium: 134 mmol/L — ABNORMAL LOW (ref 135–145)
Total Bilirubin: 9 mg/dL — ABNORMAL HIGH (ref 0.0–1.2)
Total Protein: 6.3 g/dL — ABNORMAL LOW (ref 6.5–8.1)

## 2023-07-05 LAB — CBC WITH DIFFERENTIAL/PLATELET
Abs Immature Granulocytes: 0.08 10*3/uL — ABNORMAL HIGH (ref 0.00–0.07)
Basophils Absolute: 0 10*3/uL (ref 0.0–0.1)
Basophils Relative: 0 %
Eosinophils Absolute: 0.3 10*3/uL (ref 0.0–0.5)
Eosinophils Relative: 2 %
HCT: 44.9 % (ref 39.0–52.0)
Hemoglobin: 15.4 g/dL (ref 13.0–17.0)
Immature Granulocytes: 1 %
Lymphocytes Relative: 3 %
Lymphs Abs: 0.4 10*3/uL — ABNORMAL LOW (ref 0.7–4.0)
MCH: 29.2 pg (ref 26.0–34.0)
MCHC: 34.3 g/dL (ref 30.0–36.0)
MCV: 85 fL (ref 80.0–100.0)
Monocytes Absolute: 1.1 10*3/uL — ABNORMAL HIGH (ref 0.1–1.0)
Monocytes Relative: 8 %
Neutro Abs: 11.2 10*3/uL — ABNORMAL HIGH (ref 1.7–7.7)
Neutrophils Relative %: 86 %
Platelets: 163 10*3/uL (ref 150–400)
RBC: 5.28 MIL/uL (ref 4.22–5.81)
RDW: 13.2 % (ref 11.5–15.5)
WBC: 13.1 10*3/uL — ABNORMAL HIGH (ref 4.0–10.5)
nRBC: 0 % (ref 0.0–0.2)

## 2023-07-05 LAB — GLUCOSE, CAPILLARY
Glucose-Capillary: 111 mg/dL — ABNORMAL HIGH (ref 70–99)
Glucose-Capillary: 94 mg/dL (ref 70–99)
Glucose-Capillary: 152 mg/dL — ABNORMAL HIGH (ref 70–99)

## 2023-07-05 SURGERY — ERCP, WITH INTERVENTION IF INDICATED
Anesthesia: General

## 2023-07-05 NOTE — Progress Notes (Signed)
 PROGRESS NOTE    Darius [name]  WUJ:[id number] DOB: February 02, 1956 DOA: 07/03/2023 PCP: Laneta Pintos, MD   Brief Narrative:  HPI: Darius [name] is a 67 y.o. male with medical history significant of diabetes, CKD 3B, atrial fibrillation, CHF, OSA, obesity, bladder cancer presenting with abdominal pain.   Patient reports 3 days of nonbloody diarrhea.  He started having moderate to severe abdominal pain last night.  Reports some nausea this morning.   Denies fevers, chills, chest pain, shortness of breath, constipation, vomiting.   ED Course: Vital signs in the ED notable for blood pressure in the 140s-150s systolic.  Lab workup included CMP with sodium 133, potassium 3.3, bicarb 21, creatinine 1.36, glucose 185, AST 142, ALT 215, alk phos 188, T. bili 8.4.  CBC with leukocytosis to 15.4.  Lipase normal.  Urinalysis with glucose, bilirubin, ketones, protein, leukocytes, bacteria.  Urine culture pending.  CT abdomen pelvis showed changes consistent with acute cholecystitis and acute choledocholithiasis, no evidence of static disease in the setting of prior bladder cancer.  No other acute normality.   General surgery consulted and will see the patient and requested GI consultation who will also see the patient and recommending MRCP.  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  > LFTs elevated with AST 142, ALT 215, alk phos 188, T. bili 8.4. Leukocytosis to 15.4. > General Surgery and  gastroenterology consulted.  Seen by GI, MRCP obtained which confirmed several stones in CBD.  Patient remains on Zosyn.  Symptoms have improved.  LFTs improving.  Per general surgery, he will need cholecystectomy during this hospitalization however GI and general surgery both requested cardiology clearance.  Cardiology consulted, EKG and echo both unremarkable.  Will likely be cleared however will wait for cardiology to formally cleared this patient.  Appreciate all specialties.   Diabetes melitis type II, POA: > 20 units daily, semaglutide  at home along with metformin  but currently on 10 units of Lantus  and SSI and blood sugar control.   CKD 3B ruled out, CKD stage IIIa ruled in: Patient's baseline creatinine appears to be between 1.3-1.5 making it CKD stage IIIa, currently at baseline.  Hypokalemia: Resolved.  Atrial fibrillation - Rate is controlled, not on any rate control medications at home, Xarelto  on hold.   CHF with recovered ejection fraction > Last echo was 2023 with EF 50 of 80%, normal diastolic function, normal RV function. -Not currently taking any medications for this   History of bladder cancer - Noted   Obesity class I: Weight loss and dietary modification counseled.   OSA - Not on CPAP per chart   Hyperlipidemia: Holding statin due to elevated LFTs.  BPH: Resume Flomax .  DVT prophylaxis: SCDs Start: 07/03/23 1559   Code Status: Full Code  Family Communication:  None present at bedside.  Plan of care discussed with patient in length and he/she verbalized understanding and agreed with it.  Status is: Inpatient Remains inpatient appropriate because: Needs ERCP followed by cholecystectomy.   Estimated body  mass index is 30.81 kg/m as calculated from the following:   Height as of this encounter: 6\' 2"  (1.88 m).   Weight as of this encounter: 108.9 kg.    Nutritional Assessment: Body mass index is 30.81 kg/m.[name] Seen by dietician.  I agree with the assessment  and plan as outlined below: Nutrition Status:        . Skin Assessment: I have examined the patient's skin and I agree with the wound assessment as performed by the wound care RN as outlined below:    Consultants:  Surgery and GI  Procedures:  As above  Antimicrobials:  Anti-infectives (From admission, onward)    Start     Dose/Rate Route Frequency Ordered Stop   07/03/23 2300  piperacillin-tazobactam (ZOSYN) IVPB 3.375 g        3.375 g 12.5 mL/hr over 240 Minutes Intravenous Every 8 hours 07/03/23 1612     07/03/23 1530  piperacillin-tazobactam (ZOSYN) IVPB 3.375 g        3.375 g 100 mL/hr over 30 Minutes Intravenous  Once 07/03/23 1515 07/03/23 1559         Subjective: Seen and examined.  Fully alert and oriented.  Denies any complaint.  Objective: Vitals:   07/04/23 1729 07/04/23 2131 07/05/23 0453 07/05/23 0756  BP: (!) 165/94 (!) 172/106 125/89 125/87  Pulse: 89 85 96 87  Resp:  17 19 17   Temp: 98.5 F (36.9 C) 98.4 F (36.9 C) 98.2 F (36.8 C) 97.8 F (36.6 C)  TempSrc: Oral Oral Oral Oral  SpO2: 97% 98% 98% 99%  Weight:      Height:        Intake/Output Summary (Last 24 hours) at 07/05/2023 0902 Last data filed at 07/05/2023 0454 Gross per 24 hour  Intake --  Output 925 ml  Net -925 ml   Filed Weights   07/03/23 1117  Weight: 108.9 kg    Examination:  General exam: Appears calm and comfortable, obese Respiratory system: Clear to auscultation. Respiratory effort normal. Cardiovascular system: S1 & S2 heard, RRR. No JVD, murmurs, rubs, gallops or clicks. No pedal edema. Gastrointestinal system: Abdomen is soft, slightly distended and tender at epigastrium and right upper quadrant. No organomegaly or masses felt. Normal bowel sounds heard. Central nervous system: Alert and oriented. No focal neurological deficits. Extremities: Symmetric 5 x 5 power. Skin: No rashes, lesions or ulcers.    Data Reviewed: I have personally reviewed following  labs and imaging studies  CBC: Recent Labs  Lab 07/03/23 1119 07/04/23 0304 07/05/23 0416  WBC 15.4* 12.4* 13.1*  NEUTROABS  --   --  11.2*  HGB 15.9 15.2 15.4  HCT 46.5 44.7 44.9  MCV 85.5 85.8 85.0  PLT 179 167 163   Basic Metabolic Panel: Recent Labs  Lab 07/03/23 1119 07/04/23 0304 07/05/23 0416  NA 133* 135 134*  K 3.3* 4.1 4.0  CL 100 100 99  CO2 21* 25 26  GLUCOSE 185* 97 121*  BUN 14 12 13   CREATININE 1.36* 1.35* 1.26*  CALCIUM  9.2 9.0 8.5*   GFR: Estimated Creatinine Clearance: 72.7 mL/min (A) (by C-G formula based on SCr of 1.26 mg/dL (H)). Liver Function Tests: Recent Labs  Lab 07/03/23 1119 07/04/23 0304 07/05/23 0416  AST 142* 80* 44*  ALT 215* 165* 112*  ALKPHOS 188* 170* 181*  BILITOT 8.4* 6.6* 9.0*  PROT 7.2 6.3* 6.3*  ALBUMIN 3.5 2.9* 2.6*   Recent Labs  Lab 07/03/23 1119  LIPASE 34  No results for input(s): "AMMONIA" in the last 168 hours. Coagulation Profile: Recent Labs  Lab 07/04/23 0304  INR 1.2   Cardiac Enzymes: No results for input(s): "CKTOTAL", "CKMB", "CKMBINDEX", "TROPONINI" in the last 168 hours. BNP (last 3 results) No results for input(s): "PROBNP" in the last 8760 hours. HbA1C: No results for input(s): "HGBA1C" in the last 72 hours. CBG: Recent Labs  Lab 07/04/23 0839 07/04/23 1303 07/04/23 1632 07/04/23 2129 07/05/23 0754  GLUCAP 104* 220* 121* 143* 111*   Lipid Profile: No results for input(s): "CHOL", "HDL", "LDLCALC", "TRIG", "CHOLHDL", "LDLDIRECT" in the last 72 hours. Thyroid  Function Tests: No results for input(s): "TSH", "T4TOTAL", "FREET4", "T3FREE", "THYROIDAB" in the last 72 hours. Anemia Panel: No results for input(s): "VITAMINB12", "FOLATE", "FERRITIN", "TIBC", "IRON", "RETICCTPCT" in the last 72 hours. Sepsis Labs: No results for input(s): "PROCALCITON", "LATICACIDVEN" in the last 168 hours.  No results found for this or any previous visit (from the past 240 hours).   Radiology  Studies: ECHOCARDIOGRAM COMPLETE Result Date: 07/04/2023    ECHOCARDIOGRAM REPORT   Patient Name:   Darius [name] Date of Exam: 07/04/2023 Medical Rec #:  [id number]       Height:       74.0 in Accession #:    [id number]      Weight:       240.0 lb Date of Birth:  [birth year]      BSA:          2.349 m Patient Age:    67 years        BP:           146/86 mmHg Patient Gender: M               HR:           91 bpm. Exam Location:  Inpatient Procedure: 2D Echo, Cardiac Doppler and Color Doppler (Both Spectral and Color            Flow Doppler were utilized during procedure). Indications:    Preoperative evaluation  History:        Patient has prior history of Echocardiogram examinations, most                 recent 12/30/2021. CHF and Cardiomyopathy, CKD, stage 3,                 Arrythmias:Atrial Fibrillation, Signs/Symptoms:Hypotension; Risk                 Factors:Diabetes and Sleep Apnea.  Sonographer:    Terrilee Few RCS Referring Phys: [id number] Ethelle Herb A ACHARYA IMPRESSIONS  1. Left ventricular ejection fraction, by estimation, is 50 to 55%. The left ventricle has low normal function. The left ventricle has no regional wall motion abnormalities. There is mild left ventricular hypertrophy. Left ventricular diastolic parameters are indeterminate.  2. Right ventricular systolic function is mildly reduced. The right ventricular size is mildly enlarged. Tricuspid regurgitation signal is inadequate for assessing PA pressure.  3. Left atrial size was mildly dilated.  4. Right atrial size was mildly dilated.  5. The mitral valve is grossly normal. Trivial mitral valve regurgitation. No evidence of mitral stenosis.  6. The aortic valve is tricuspid. There is mild calcification of the aortic valve. Aortic valve regurgitation is not visualized. Aortic valve sclerosis/calcification is present, without any evidence of aortic stenosis.  7. The inferior vena cava is dilated in size with <50% respiratory variability,  suggesting right atrial pressure of 15 mmHg.  FINDINGS  Left Ventricle: Left ventricular ejection fraction, by estimation, is 50 to 55%. The left ventricle has low normal function. The left ventricle has no regional wall motion abnormalities. The left ventricular internal cavity size was normal in size. There is mild left ventricular hypertrophy. Left ventricular diastolic parameters are indeterminate. Right Ventricle: The right ventricular size is mildly enlarged. No increase in right ventricular wall thickness. Right ventricular systolic function is mildly reduced. Tricuspid regurgitation signal is inadequate for assessing PA pressure. The tricuspid regurgitant velocity is 1.86 m/s, and with an assumed right atrial pressure of 15 mmHg, the estimated right ventricular systolic pressure is 28.8 mmHg. Left Atrium: Left atrial size was mildly dilated. Right Atrium: Right atrial size was mildly dilated. Pericardium: There is no evidence of pericardial effusion. Mitral Valve: The mitral valve is grossly normal. Trivial mitral valve regurgitation. No evidence of mitral valve stenosis. Tricuspid Valve: The tricuspid valve is normal in structure. Tricuspid valve regurgitation is trivial. No evidence of tricuspid stenosis. Aortic Valve: The aortic valve is tricuspid. There is mild calcification of the aortic valve. Aortic valve regurgitation is not visualized. Aortic valve sclerosis/calcification is present, without any evidence of aortic stenosis. Aortic valve mean gradient measures 7.0 mmHg. Aortic valve peak gradient measures 12.7 mmHg. Aortic valve area, by VTI measures 2.28 cm. Pulmonic Valve: The pulmonic valve was normal in structure. Pulmonic valve regurgitation is trivial. No evidence of pulmonic stenosis. Aorta: The aortic root is normal in size and structure. Ascending aorta measurements are within normal limits for age when indexed to body surface area. Venous: The inferior vena cava is dilated in size with less  than 50% respiratory variability, suggesting right atrial pressure of 15 mmHg. IAS/Shunts: No atrial level shunt detected by color flow Doppler.  LEFT VENTRICLE PLAX 2D LVIDd:         5.10 cm      Diastology LVIDs:         3.50 cm      LV e' medial:    7.51 cm/s LV PW:         1.40 cm      LV E/e' medial:  15.3 LV IVS:        1.20 cm      LV e' lateral:   11.30 cm/s LVOT diam:     2.40 cm      LV E/e' lateral: 10.2 LV SV:         71 LV SV Index:   30 LVOT Area:     4.52 cm  LV Volumes (MOD) LV vol d, MOD A2C: 109.0 ml LV vol d, MOD A4C: 139.0 ml LV vol s, MOD A2C: 51.2 ml LV vol s, MOD A4C: 63.7 ml LV SV MOD A2C:     57.8 ml LV SV MOD A4C:     139.0 ml LV SV MOD BP:      66.1 ml RIGHT VENTRICLE             IVC RV S prime:     18.50 cm/s  IVC diam: 3.10 cm TAPSE (M-mode): 1.9 cm LEFT ATRIUM             Index        RIGHT ATRIUM           Index LA diam:        5.30 cm 2.26 cm/m   RA Area:     26.43 cm LA Vol (A2C):   75.6 ml 32.19 ml/m  RA  Volume:   86.37 ml  36.77 ml/m LA Vol (A4C):   89.1 ml 37.94 ml/m LA Biplane Vol: 84.8 ml 36.11 ml/m  AORTIC VALVE AV Area (Vmax):    2.26 cm AV Area (Vmean):   2.34 cm AV Area (VTI):     2.28 cm AV Vmax:           178.00 cm/s AV Vmean:          119.000 cm/s AV VTI:            0.311 m AV Peak Grad:      12.7 mmHg AV Mean Grad:      7.0 mmHg LVOT Vmax:         89.10 cm/s LVOT Vmean:        61.600 cm/s LVOT VTI:          0.157 m LVOT/AV VTI ratio: 0.50  AORTA Ao Root diam: 3.60 cm Ao Asc diam:  3.90 cm MITRAL VALVE                TRICUSPID VALVE MV Area (PHT): 6.65 cm     TR Peak grad:   13.8 mmHg MV Decel Time: 114 msec     TR Vmax:        186.00 cm/s MV E velocity: 115.00 cm/s                             SHUNTS                             Systemic VTI:  0.16 m                             Systemic Diam: 2.40 cm Grady Lawman MD Electronically signed by Grady Lawman MD Signature Date/Time: 07/04/2023/3:04:49 PM    Final    MR ABDOMEN MRCP W WO CONTAST Result Date:  07/04/2023 CLINICAL DATA:  Gallstones. Abdominal pain. History bladder cancer. EXAM: MRI ABDOMEN WITHOUT AND WITH CONTRAST (INCLUDING MRCP) TECHNIQUE: Multiplanar multisequence MR imaging of the abdomen was performed both before and after the administration of intravenous contrast. Heavily T2-weighted images of the biliary and pancreatic ducts were obtained, and three-dimensional MRCP images were rendered by post processing. CONTRAST:  10mL GADAVIST GADOBUTROL 1 MMOL/ML IV SOLN COMPARISON:  CT AP 07/03/2023 FINDINGS: Lower chest: No acute abnormality. Hepatobiliary: Mild liver steatosis. Cyst within segment 4 B measures 2.4 cm, image 25/19. No enhancing liver lesions. Stones identified within the dependent portion of the gallbladder measuring up to 5.1 mm. Gallbladder wall thickening and edema measures up to 9 mm, image 13/4. The common bile duct measures 8 mm in maximum dimension. Multiple stones within the distal common bile duct are noted (at least 5). The largest of these measures 5 mm, image 19/4. Pancreas: No mass, inflammatory changes, or other parenchymal abnormality identified. Spleen: Normal in size without suspicious abnormality. Chronic areas of scarring within the splenic parenchyma identified Adrenals/Urinary Tract: Right adrenal gland is normal. Left adrenal nodule measures 1 cm. Small myelolipoma within the left adrenal gland measures 1.1 cm, image 30/4, image 16/8. No follow-up imaging recommended. No hydronephrosis. Mild left-sided perinephric soft tissue stranding. Upper pole left kidney cysts containing thin, internal area of hairline septation measures 2.8 cm and is compatible with a benign Bosniak class 2 cyst. No follow-up imaging recommended. Stomach/Bowel: The stomach  appears normal. No pathologic dilatation of the large or small bowel loops. Vascular/Lymphatic: No pathologically enlarged lymph nodes identified. No abdominal aortic aneurysm demonstrated. Other:  No free fluid or fluid  collections. Musculoskeletal: Choose 1 IMPRESSION: 1. Cholelithiasis with gallbladder wall thickening and edema compatible with acute cholecystitis. 2. Multiple stones within the distal common bile duct (at least 5). The largest of these measures 5 mm. 3. Mild liver steatosis. 4. Small left adrenal myelolipoma. No follow-up imaging recommended. 5. Mild left-sided perinephric soft tissue stranding. Correlate for any clinical signs or symptoms of pyelonephritis. Electronically Signed   By: Kimberley Penman M.D.   On: 07/04/2023 05:24   CT ABDOMEN PELVIS W CONTRAST Result Date: 07/03/2023 CLINICAL DATA:  Abdominal pain, acute, nonlocalized. History of urinary bladder cancer. * Tracking Code: BO * EXAM: CT ABDOMEN AND PELVIS WITH CONTRAST TECHNIQUE: Multidetector CT imaging of the abdomen and pelvis was performed using the standard protocol following bolus administration of intravenous contrast. RADIATION DOSE REDUCTION: This exam was performed according to the departmental dose-optimization program which includes automated exposure control, adjustment of the mA and/or kV according to patient size and/or use of iterative reconstruction technique. CONTRAST:  75mL OMNIPAQUE  IOHEXOL  350 MG/ML SOLN COMPARISON:  CT scan chest, abdomen and pelvis from 06/08/2023. FINDINGS: Lower chest: There are subpleural atelectatic changes in the visualized lung bases. No overt consolidation. No pleural effusion. The heart is normal in size. No pericardial effusion. Hepatobiliary: The liver is normal in size. Non-cirrhotic configuration. No suspicious mass. These is mild diffuse hepatic steatosis. There is a subcapsular simple cyst measuring 2.0 x 2.5 cm in the left hepatic lobe, segment 4A. The gallbladder is distended and exhibit mild diffuse gallbladder wall thickening and mild to moderate pericholecystic fat stranding. There are several, sub 5 mm calcified gallstones. No intrahepatic bile duct dilation. The extrahepatic bile duct is  also not dilated however, there are at least 4, 2-5 mm sized calculi in the distal extrahepatic bile duct. Findings favor acute cholecystitis and choledocholithiasis. Pancreas: Unremarkable. No pancreatic ductal dilatation or surrounding inflammatory changes. Spleen: Within normal limits. No focal lesion. 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 Unremarkable right adrenal gland. No suspicious renal mass. There is a 1.9 x 2.7 cm cyst arising from the left kidney upper pole which exhibit thin intervening partially calcified septum. No discrete mural nodule. No nephroureterolithiasis or obstructive uropathy on either side. Focal scarring noted in the right kidney lower pole, laterally. Urinary bladder is under distended, precluding optimal assessment. However, no large mass or stones identified. No perivesical fat stranding. Stomach/Bowel: No disproportionate dilation of the small or large bowel loops. No evidence of abnormal bowel wall thickening or inflammatory changes. The appendix is unremarkable. There is fecalization of several small bowel loops, which is nonspecific but can be seen with slow transit time/incompetent ileocecal valve. No bowel obstruction. Vascular/Lymphatic: No ascites or pneumoperitoneum. There is a stable oval 9 x 13 mm right retrocrural hypoattenuating lesion, which is tubular and favored to represent probable thrombosed vascular structure. No abdominal or pelvic lymphadenopathy, by size criteria. No aneurysmal dilation of the major abdominal arteries. There are mild peripheral atherosclerotic vascular calcifications of the aorta and its major branches. Reproductive: Normal size prostate. Symmetric seminal vesicles. Other: There are bilateral small fat containing inguinal hernias. The soft tissues and abdominal wall are otherwise unremarkable. Musculoskeletal: No suspicious osseous lesions. There are mild - moderate multilevel degenerative changes in the  visualized spine. IMPRESSION: 1. Findings favor  acute cholecystitis and choledocholithiasis. 2. No metastatic bladder carcinoma in the abdomen or pelvis. 3. Multiple other nonacute observations, as described above. Aortic Atherosclerosis (ICD10-I70.0). Electronically Signed   By: Beula Brunswick M.D.   On: 07/03/2023 14:52    Scheduled Meds:  insulin  aspart  0-15 Units Subcutaneous TID WC   insulin  glargine-yfgn  10 Units Subcutaneous Daily   metoprolol  tartrate  12.5 mg Oral BID   sodium chloride  flush  3 mL Intravenous Q12H   Continuous Infusions:  piperacillin-tazobactam (ZOSYN)  IV 3.375 g (07/05/23 0521)     LOS: 2 days   Modena Andes, MD [facility]  07/05/2023, 9:02 AM   *Please note that this is a verbal dictation therefore any spelling or grammatical errors are due to the "[employer]agon [employer] One" system interpretation.  Please page via Amion and do not message via secure chat for urgent patient care matters. Secure chat can be used for non urgent patient care matters.  How to contact the [facility] Attending or Consulting provider 7A - 7P or covering provider during after hours 7P -7A, for this patient?  Check the care team in [facility] and look for a) attending/consulting [facility] provider listed and b) the [facility] team listed. Page or secure chat 7A-7P. Log into [email or web address] and use [facility] universal password to access. If you do not have the password, please contact the hospital operator. Locate the [facility] provider you are looking for under [facility] and page to a number that you can be directly reached. If you still have difficulty reaching the provider, please page the [facility] (Director on Call) for the Hospitalists listed on amion for assistance.

## 2023-07-05 NOTE — Progress Notes (Signed)
 Patient ID: Darius [name], male   DOB: 1955-09-13, 68 y.o.   MRN: [id number]    Progress Note   Subjective   Day # 2 CC; abdominal pain, nausea, diarrhea  IV Zosyn   CT abdomen pelvis on admission showed cholelithiasis, suspected cholecystitis and evidence of choledocholithiasis with normal caliber CBD MRI/MRCP confirms mild hepatic steatosis, cholelithiasis and gallbladder wall thickening and edema consistent with cholecystitis, CBD 8 mm with multiple stones in the distal common bile duct  INR 1.2 Today-WBC 13.1/hemoglobin 15.4/hematocrit 44.9 Potassium 4.0/BUN 13/creatinine 1.26 T. bili 9.0/alk phos 181/AST 44/ALT 112  Patient says his abdomen feels better, he denies nausea or vomiting, Has been afebrile, loose stool this morning  Patient had been scheduled for ERCP today, unfortunately this will not be able to be accomplished today   Objective   Vital signs in last 24 hours: Temp:  [97.8 F (36.6 C)-98.5 F (36.9 C)] 97.8 F (36.6 C) (05/25 0756) Pulse Rate:  [85-106] 87 (05/25 0756) Resp:  [17-19] 17 (05/25 0756) BP: (125-172)/(87-106) 125/87 (05/25 0756) SpO2:  [97 %-99 %] 99 % (05/25 0756) Last BM Date : 07/03/23 General:    Older white male in NAD, very talkative and difficult to redirect Heart:  Regular rate and rhythm; no murmurs Lungs: Respirations even and unlabored, lungs CTA bilaterally Abdomen:  Soft, obese, very minimally tender in the epigastrium/right upper quadrant ,no rebound. Normal bowel sounds. Extremities:  Without edema. Neurologic:  Alert and oriented,  grossly normal neurologically. Psych:  Cooperative. Normal mood and affect.  Intake/Output from previous day: 05/24 0701 - 05/25 0700 In: -  Out: 925 [Urine:925] Intake/Output this shift: No intake/output data recorded.  Lab Results: Recent Labs    07/03/23 1119 07/04/23 0304 07/05/23 0416  WBC 15.4* 12.4* 13.1*  HGB 15.9 15.2 15.4  HCT 46.5 44.7 44.9  PLT 179 167 163   BMET Recent  Labs    07/03/23 1119 07/04/23 0304 07/05/23 0416  NA 133* 135 134*  K 3.3* 4.1 4.0  CL 100 100 99  CO2 21* 25 26  GLUCOSE 185* 97 121*  BUN 14 12 13   CREATININE 1.36* 1.35* 1.26*  CALCIUM  9.2 9.0 8.5*   LFT Recent Labs    07/05/23 0416  PROT 6.3*  ALBUMIN 2.6*  AST 44*  ALT 112*  ALKPHOS 181*  BILITOT 9.0*   PT/INR Recent Labs    07/04/23 0304  LABPROT 15.4*  INR 1.2    Studies/Results: ECHOCARDIOGRAM COMPLETE Result Date: 07/04/2023    ECHOCARDIOGRAM REPORT   Patient Name:   RIKI GEHRING Date of Exam: 07/04/2023 Medical Rec #:  [id number]       Height:       74.0 in Accession #:    [id number]      Weight:       240.0 lb Date of Birth:  [birth year]      BSA:          2.349 m Patient Age:    67 years        BP:           146/86 mmHg Patient Gender: M               HR:           91 bpm. Exam Location:  Inpatient Procedure: 2D Echo, Cardiac Doppler and Color Doppler (Both Spectral and Color            Flow Doppler were utilized during procedure). Indications:  Preoperative evaluation  History:        Patient has prior history of Echocardiogram examinations, most                 recent 12/30/2021. CHF and Cardiomyopathy, CKD, stage 3,                 Arrythmias:Atrial Fibrillation, Signs/Symptoms:Hypotension; Risk                 Factors:Diabetes and Sleep Apnea.  Sonographer:    Terrilee Few RCS Referring Phys: [id number] Ethelle Herb A ACHARYA IMPRESSIONS  1. Left ventricular ejection fraction, by estimation, is 50 to 55%. The left ventricle has low normal function. The left ventricle has no regional wall motion abnormalities. There is mild left ventricular hypertrophy. Left ventricular diastolic parameters are indeterminate.  2. Right ventricular systolic function is mildly reduced. The right ventricular size is mildly enlarged. Tricuspid regurgitation signal is inadequate for assessing PA pressure.  3. Left atrial size was mildly dilated.  4. Right atrial size was mildly dilated.   5. The mitral valve is grossly normal. Trivial mitral valve regurgitation. No evidence of mitral stenosis.  6. The aortic valve is tricuspid. There is mild calcification of the aortic valve. Aortic valve regurgitation is not visualized. Aortic valve sclerosis/calcification is present, without any evidence of aortic stenosis.  7. The inferior vena cava is dilated in size with <50% respiratory variability, suggesting right atrial pressure of 15 mmHg. FINDINGS  Left Ventricle: Left ventricular ejection fraction, by estimation, is 50 to 55%. The left ventricle has low normal function. The left ventricle has no regional wall motion abnormalities. The left ventricular internal cavity size was normal in size. There is mild left ventricular hypertrophy. Left ventricular diastolic parameters are indeterminate. Right Ventricle: The right ventricular size is mildly enlarged. No increase in right ventricular wall thickness. Right ventricular systolic function is mildly reduced. Tricuspid regurgitation signal is inadequate for assessing PA pressure. The tricuspid regurgitant velocity is 1.86 m/s, and with an assumed right atrial pressure of 15 mmHg, the estimated right ventricular systolic pressure is 28.8 mmHg. Left Atrium: Left atrial size was mildly dilated. Right Atrium: Right atrial size was mildly dilated. Pericardium: There is no evidence of pericardial effusion. Mitral Valve: The mitral valve is grossly normal. Trivial mitral valve regurgitation. No evidence of mitral valve stenosis. Tricuspid Valve: The tricuspid valve is normal in structure. Tricuspid valve regurgitation is trivial. No evidence of tricuspid stenosis. Aortic Valve: The aortic valve is tricuspid. There is mild calcification of the aortic valve. Aortic valve regurgitation is not visualized. Aortic valve sclerosis/calcification is present, without any evidence of aortic stenosis. Aortic valve mean gradient measures 7.0 mmHg. Aortic valve peak gradient  measures 12.7 mmHg. Aortic valve area, by VTI measures 2.28 cm. Pulmonic Valve: The pulmonic valve was normal in structure. Pulmonic valve regurgitation is trivial. No evidence of pulmonic stenosis. Aorta: The aortic root is normal in size and structure. Ascending aorta measurements are within normal limits for age when indexed to body surface area. Venous: The inferior vena cava is dilated in size with less than 50% respiratory variability, suggesting right atrial pressure of 15 mmHg. IAS/Shunts: No atrial level shunt detected by color flow Doppler.  LEFT VENTRICLE PLAX 2D LVIDd:         5.10 cm      Diastology LVIDs:         3.50 cm      LV e' medial:    7.51 cm/s LV PW:  1.40 cm      LV E/e' medial:  15.3 LV IVS:        1.20 cm      LV e' lateral:   11.30 cm/s LVOT diam:     2.40 cm      LV E/e' lateral: 10.2 LV SV:         71 LV SV Index:   30 LVOT Area:     4.52 cm  LV Volumes (MOD) LV vol d, MOD A2C: 109.0 ml LV vol d, MOD A4C: 139.0 ml LV vol s, MOD A2C: 51.2 ml LV vol s, MOD A4C: 63.7 ml LV SV MOD A2C:     57.8 ml LV SV MOD A4C:     139.0 ml LV SV MOD BP:      66.1 ml RIGHT VENTRICLE             IVC RV S prime:     18.50 cm/s  IVC diam: 3.10 cm TAPSE (M-mode): 1.9 cm LEFT ATRIUM             Index        RIGHT ATRIUM           Index LA diam:        5.30 cm 2.26 cm/m   RA Area:     26.43 cm LA Vol (A2C):   75.6 ml 32.19 ml/m  RA Volume:   86.37 ml  36.77 ml/m LA Vol (A4C):   89.1 ml 37.94 ml/m LA Biplane Vol: 84.8 ml 36.11 ml/m  AORTIC VALVE AV Area (Vmax):    2.26 cm AV Area (Vmean):   2.34 cm AV Area (VTI):     2.28 cm AV Vmax:           178.00 cm/s AV Vmean:          119.000 cm/s AV VTI:            0.311 m AV Peak Grad:      12.7 mmHg AV Mean Grad:      7.0 mmHg LVOT Vmax:         89.10 cm/s LVOT Vmean:        61.600 cm/s LVOT VTI:          0.157 m LVOT/AV VTI ratio: 0.50  AORTA Ao Root diam: 3.60 cm Ao Asc diam:  3.90 cm MITRAL VALVE                TRICUSPID VALVE MV Area (PHT): 6.65 cm      TR Peak grad:   13.8 mmHg MV Decel Time: 114 msec     TR Vmax:        186.00 cm/s MV E velocity: 115.00 cm/s                             SHUNTS                             Systemic VTI:  0.16 m                             Systemic Diam: 2.40 cm Grady Lawman MD Electronically signed by Grady Lawman MD Signature Date/Time: 07/04/2023/3:04:49 PM    Final    MR ABDOMEN MRCP W WO CONTAST Result Date: 07/04/2023 CLINICAL DATA:  Gallstones. Abdominal pain. History bladder  cancer. EXAM: MRI ABDOMEN WITHOUT AND WITH CONTRAST (INCLUDING MRCP) TECHNIQUE: Multiplanar multisequence MR imaging of the abdomen was performed both before and after the administration of intravenous contrast. Heavily T2-weighted images of the biliary and pancreatic ducts were obtained, and three-dimensional MRCP images were rendered by post processing. CONTRAST:  10mL GADAVIST  GADOBUTROL  1 MMOL/ML IV SOLN COMPARISON:  CT AP 07/03/2023 FINDINGS: Lower chest: No acute abnormality. Hepatobiliary: Mild liver steatosis. Cyst within segment 4 B measures 2.4 cm, image 25/19. No enhancing liver lesions. Stones identified within the dependent portion of the gallbladder measuring up to 5.1 mm. Gallbladder wall thickening and edema measures up to 9 mm, image 13/4. The common bile duct measures 8 mm in maximum dimension. Multiple stones within the distal common bile duct are noted (at least 5). The largest of these measures 5 mm, image 19/4. Pancreas: No mass, inflammatory changes, or other parenchymal abnormality identified. Spleen: Normal in size without suspicious abnormality. Chronic areas of scarring within the splenic parenchyma identified Adrenals/Urinary Tract: Right adrenal gland is normal. Left adrenal nodule measures 1 cm. Small myelolipoma within the left adrenal gland measures 1.1 cm, image 30/4, image 16/8. No follow-up imaging recommended. No hydronephrosis. Mild left-sided perinephric soft tissue stranding. Upper pole left kidney cysts  containing thin, internal area of hairline septation measures 2.8 cm and is compatible with a benign Bosniak class 2 cyst. No follow-up imaging recommended. Stomach/Bowel: The stomach appears normal. No pathologic dilatation of the large or small bowel loops. Vascular/Lymphatic: No pathologically enlarged lymph nodes identified. No abdominal aortic aneurysm demonstrated. Other:  No free fluid or fluid collections. Musculoskeletal: Choose 1 IMPRESSION: 1. Cholelithiasis with gallbladder wall thickening and edema compatible with acute cholecystitis. 2. Multiple stones within the distal common bile duct (at least 5). The largest of these measures 5 mm. 3. Mild liver steatosis. 4. Small left adrenal myelolipoma. No follow-up imaging recommended. 5. Mild left-sided perinephric soft tissue stranding. Correlate for any clinical signs or symptoms of pyelonephritis. Electronically Signed   By: Kimberley Penman M.D.   On: 07/04/2023 05:24   CT ABDOMEN PELVIS W CONTRAST Result Date: 07/03/2023 CLINICAL DATA:  Abdominal pain, acute, nonlocalized. History of urinary bladder cancer. * Tracking Code: BO * EXAM: CT ABDOMEN AND PELVIS WITH CONTRAST TECHNIQUE: Multidetector CT imaging of the abdomen and pelvis was performed using the standard protocol following bolus administration of intravenous contrast. RADIATION DOSE REDUCTION: This exam was performed according to the departmental dose-optimization program which includes automated exposure control, adjustment of the mA and/or kV according to patient size and/or use of iterative reconstruction technique. CONTRAST:  75mL OMNIPAQUE  IOHEXOL  350 MG/ML SOLN COMPARISON:  CT scan chest, abdomen and pelvis from 06/08/2023. FINDINGS: Lower chest: There are subpleural atelectatic changes in the visualized lung bases. No overt consolidation. No pleural effusion. The heart is normal in size. No pericardial effusion. Hepatobiliary: The liver is normal in size. Non-cirrhotic configuration. No  suspicious mass. These is mild diffuse hepatic steatosis. There is a subcapsular simple cyst measuring 2.0 x 2.5 cm in the left hepatic lobe, segment 4A. The gallbladder is distended and exhibit mild diffuse gallbladder wall thickening and mild to moderate pericholecystic fat stranding. There are several, sub 5 mm calcified gallstones. No intrahepatic bile duct dilation. The extrahepatic bile duct is also not dilated however, there are at least 4, 2-5 mm sized calculi in the distal extrahepatic bile duct. Findings favor acute cholecystitis and choledocholithiasis. Pancreas: Unremarkable. No pancreatic ductal dilatation or surrounding inflammatory changes. Spleen: Within normal limits.  No focal lesion. 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 Unremarkable right adrenal gland. No suspicious renal mass. There is a 1.9 x 2.7 cm cyst arising from the left kidney upper pole which exhibit thin intervening partially calcified septum. No discrete mural nodule. No nephroureterolithiasis or obstructive uropathy on either side. Focal scarring noted in the right kidney lower pole, laterally. Urinary bladder is under distended, precluding optimal assessment. However, no large mass or stones identified. No perivesical fat stranding. Stomach/Bowel: No disproportionate dilation of the small or large bowel loops. No evidence of abnormal bowel wall thickening or inflammatory changes. The appendix is unremarkable. There is fecalization of several small bowel loops, which is nonspecific but can be seen with slow transit time/incompetent ileocecal valve. No bowel obstruction. Vascular/Lymphatic: No ascites or pneumoperitoneum. There is a stable oval 9 x 13 mm right retrocrural hypoattenuating lesion, which is tubular and favored to represent probable thrombosed vascular structure. No abdominal or pelvic lymphadenopathy, by size criteria. No aneurysmal dilation of the major abdominal arteries. There  are mild peripheral atherosclerotic vascular calcifications of the aorta and its major branches. Reproductive: Normal size prostate. Symmetric seminal vesicles. Other: There are bilateral small fat containing inguinal hernias. The soft tissues and abdominal wall are otherwise unremarkable. Musculoskeletal: No suspicious osseous lesions. There are mild - moderate multilevel degenerative changes in the visualized spine. IMPRESSION: 1. Findings favor acute cholecystitis and choledocholithiasis. 2. No metastatic bladder carcinoma in the abdomen or pelvis. 3. Multiple other nonacute observations, as described above. Aortic Atherosclerosis (ICD10-I70.0). Electronically Signed   By: Beula Brunswick M.D.   On: 07/03/2023 14:52       Assessment / Plan:    #69 68 year old male with history of atrial fibrillation-not on meds, diabetes mellitus and history of bladder cancer who presented after abrupt onset of abdominal pain nausea vomiting and diarrhea. Diarrhea resolved Noted to have elevated LFTs with T. bili of 8 and WBC 15 on presentation CT showed cholelithiasis probable cholecystitis and suspected choledocholithiasis with normal caliber CBD  /MRCP yesterday confirmed choledocholithiasis and also findings consistent with cholecystitis  Patient has been on IV Zosyn Leukocytosis improved LFTs stable bili 9.0 today  Plan; Unfortunately are not able to accommodate patient for ERCP today Allow low-fat diet, then n.p.o. after midnight Patient has been rescheduled with Dr. Brice Campi for tomorrow 07/06/2023.  I explained this to the patient and he understands. Also related to patient that tomorrow is a holiday and depending on the number of emergencies etc. it is possible that procedure may get bumped again tomorrow.  Continue Zosyn repeat labs in a.m.  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

## 2023-07-05 NOTE — Progress Notes (Signed)
 Subjective No acute events. Some MEG pain last night, better today  Objective: Vital signs in last 24 hours: Temp:  [97.8 F (36.6 C)-98.5 F (36.9 C)] 97.8 F (36.6 C) (05/25 0756) Pulse Rate:  [85-106] 87 (05/25 0756) Resp:  [17-19] 17 (05/25 0756) BP: (125-172)/(86-106) 125/87 (05/25 0756) SpO2:  [97 %-99 %] 99 % (05/25 0756) Last BM Date : 07/03/23  Intake/Output from previous day: 05/24 0701 - 05/25 0700 In: -  Out: 925 [Urine:925] Intake/Output this shift: No intake/output data recorded.  Gen: NAD, comfortable CV: RRR Pulm: Normal work of breathing Abd: Soft, NT/ND  Ext: SCDs in place  Lab Results: CBC  Recent Labs    07/04/23 0304 07/05/23 0416  WBC 12.4* 13.1*  HGB 15.2 15.4  HCT 44.7 44.9  PLT 167 163   BMET Recent Labs    07/04/23 0304 07/05/23 0416  NA 135 134*  K 4.1 4.0  CL 100 99  CO2 25 26  GLUCOSE 97 121*  BUN 12 13  CREATININE 1.35* 1.26*  CALCIUM  9.0 8.5*   PT/INR Recent Labs    07/04/23 0304  LABPROT 15.4*  INR 1.2   ABG No results for input(s): "PHART", "HCO3" in the last 72 hours.  Invalid input(s): "PCO2", "PO2"  Studies/Results:  Anti-infectives: Anti-infectives (From admission, onward)    Start     Dose/Rate Route Frequency Ordered Stop   07/03/23 2300  piperacillin-tazobactam (ZOSYN) IVPB 3.375 g        3.375 g 12.5 mL/hr over 240 Minutes Intravenous Every 8 hours 07/03/23 1612     07/03/23 1530  piperacillin-tazobactam (ZOSYN) IVPB 3.375 g        3.375 g 100 mL/hr over 30 Minutes Intravenous  Once 07/03/23 1515 07/03/23 1559        Assessment/Plan: Patient Active Problem List   Diagnosis Date Noted   Choledocholithiasis with acute cholecystitis 07/04/2023   Acute cholecystitis 07/03/2023   Hypotension, unspecified 02/17/2023   Stage 3b chronic kidney disease (HCC) 01/14/2023   Elevated PSA 01/14/2023   Class 3 severe obesity due to excess calories in adult 01/14/2023   Bladder cancer (HCC)  02/22/2021   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) 12/13/2020   Atrial fibrillation (HCC) 04/17/2016   Dilated cardiomyopathy (HCC) 04/19/2014   Type 2 diabetes mellitus (HCC) 04/09/2014   Non compliance with medical treatment and diet 04/09/2014   Chronic anticoagulation-Eliquis  started 04/06/14 04/09/2014   Abnormal chest CT-LUL nodule- needs f/u May 2016 04/09/2014   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  - GI planning ERCP - Cardiology for clearance for surgery underway - noted final recs based on addn'l testing, echo.   - patient would potentially benefit from cholecystectomy this admission once choledocholithiasis has been cleared if cleared for this by cards     FEN - diet per primary/ GI VTE - SCD's, hold Xarelto  per primary ID - Zosyn Admit - TRH; GI    LOS: 2 days   I spent a total of 37 minutes in both face-to-face and non-face-to-face activities, excluding procedures performed, for this visit on the date of this encounter.  Veryl Gottron  Camilo Cella, MD [facility]

## 2023-07-05 NOTE — H&P (View-Only) (Signed)
 Patient ID: Darius [name], male   DOB: 29-Sep-1955, 68 y.o.   MRN: [id number]    Progress Note   Subjective   Day # 2 CC; abdominal pain, nausea, diarrhea  IV Zosyn  CT abdomen pelvis on admission showed cholelithiasis, suspected cholecystitis and evidence of choledocholithiasis with normal caliber CBD MRI/MRCP confirms mild hepatic steatosis, cholelithiasis and gallbladder wall thickening and edema consistent with cholecystitis, CBD 8 mm with multiple stones in the distal common bile duct  INR 1.2 Today-WBC 13.1/hemoglobin 15.4/hematocrit 44.9 Potassium 4.0/BUN 13/creatinine 1.26 T. bili 9.0/alk phos 181/AST 44/ALT 112  Patient says his abdomen feels better, he denies nausea or vomiting, Has been afebrile, loose stool this morning  Patient had been scheduled for ERCP today, unfortunately this will not be able to be accomplished today   Objective   Vital signs in last 24 hours: Temp:  [97.8 F (36.6 C)-98.5 F (36.9 C)] 97.8 F (36.6 C) (05/25 0756) Pulse Rate:  [85-106] 87 (05/25 0756) Resp:  [17-19] 17 (05/25 0756) BP: (125-172)/(87-106) 125/87 (05/25 0756) SpO2:  [97 %-99 %] 99 % (05/25 0756) Last BM Date : 07/03/23 General:    Older white male in NAD, very talkative and difficult to redirect Heart:  Regular rate and rhythm; no murmurs Lungs: Respirations even and unlabored, lungs CTA bilaterally Abdomen:  Soft, obese, very minimally tender in the epigastrium/right upper quadrant ,no rebound. Normal bowel sounds. Extremities:  Without edema. Neurologic:  Alert and oriented,  grossly normal neurologically. Psych:  Cooperative. Normal mood and affect.  Intake/Output from previous day: 05/24 0701 - 05/25 0700 In: -  Out: 925 [Urine:925] Intake/Output this shift: No intake/output data recorded.  Lab Results: Recent Labs    07/03/23 1119 07/04/23 0304 07/05/23 0416  WBC 15.4* 12.4* 13.1*  HGB 15.9 15.2 15.4  HCT 46.5 44.7 44.9  PLT 179 167 163   BMET Recent  Labs    07/03/23 1119 07/04/23 0304 07/05/23 0416  NA 133* 135 134*  K 3.3* 4.1 4.0  CL 100 100 99  CO2 21* 25 26  GLUCOSE 185* 97 121*  BUN 14 12 13   CREATININE 1.36* 1.35* 1.26*  CALCIUM  9.2 9.0 8.5*   LFT Recent Labs    07/05/23 0416  PROT 6.3*  ALBUMIN 2.6*  AST 44*  ALT 112*  ALKPHOS 181*  BILITOT 9.0*   PT/INR Recent Labs    07/04/23 0304  LABPROT 15.4*  INR 1.2    Studies/Results: ECHOCARDIOGRAM COMPLETE Result Date: 07/04/2023    ECHOCARDIOGRAM REPORT   Patient Name:   Darius [name] Date of Exam: 07/04/2023 Medical Rec #:  [id number]       Height:       74.0 in Accession #:    [id number]      Weight:       240.0 lb Date of Birth:  [birth year]      BSA:          2.349 m Patient Age:    67 years        BP:           146/86 mmHg Patient Gender: M               HR:           91 bpm. Exam Location:  Inpatient Procedure: 2D Echo, Cardiac Doppler and Color Doppler (Both Spectral and Color            Flow Doppler were utilized during procedure). Indications:  Preoperative evaluation  History:        Patient has prior history of Echocardiogram examinations, most                 recent 12/30/2021. CHF and Cardiomyopathy, CKD, stage 3,                 Arrythmias:Atrial Fibrillation, Signs/Symptoms:Hypotension; Risk                 Factors:Diabetes and Sleep Apnea.  Sonographer:    Terrilee Few RCS Referring Phys: [id number] Ethelle Herb A ACHARYA IMPRESSIONS  1. Left ventricular ejection fraction, by estimation, is 50 to 55%. The left ventricle has low normal function. The left ventricle has no regional wall motion abnormalities. There is mild left ventricular hypertrophy. Left ventricular diastolic parameters are indeterminate.  2. Right ventricular systolic function is mildly reduced. The right ventricular size is mildly enlarged. Tricuspid regurgitation signal is inadequate for assessing PA pressure.  3. Left atrial size was mildly dilated.  4. Right atrial size was mildly dilated.   5. The mitral valve is grossly normal. Trivial mitral valve regurgitation. No evidence of mitral stenosis.  6. The aortic valve is tricuspid. There is mild calcification of the aortic valve. Aortic valve regurgitation is not visualized. Aortic valve sclerosis/calcification is present, without any evidence of aortic stenosis.  7. The inferior vena cava is dilated in size with <50% respiratory variability, suggesting right atrial pressure of 15 mmHg. FINDINGS  Left Ventricle: Left ventricular ejection fraction, by estimation, is 50 to 55%. The left ventricle has low normal function. The left ventricle has no regional wall motion abnormalities. The left ventricular internal cavity size was normal in size. There is mild left ventricular hypertrophy. Left ventricular diastolic parameters are indeterminate. Right Ventricle: The right ventricular size is mildly enlarged. No increase in right ventricular wall thickness. Right ventricular systolic function is mildly reduced. Tricuspid regurgitation signal is inadequate for assessing PA pressure. The tricuspid regurgitant velocity is 1.86 m/s, and with an assumed right atrial pressure of 15 mmHg, the estimated right ventricular systolic pressure is 28.8 mmHg. Left Atrium: Left atrial size was mildly dilated. Right Atrium: Right atrial size was mildly dilated. Pericardium: There is no evidence of pericardial effusion. Mitral Valve: The mitral valve is grossly normal. Trivial mitral valve regurgitation. No evidence of mitral valve stenosis. Tricuspid Valve: The tricuspid valve is normal in structure. Tricuspid valve regurgitation is trivial. No evidence of tricuspid stenosis. Aortic Valve: The aortic valve is tricuspid. There is mild calcification of the aortic valve. Aortic valve regurgitation is not visualized. Aortic valve sclerosis/calcification is present, without any evidence of aortic stenosis. Aortic valve mean gradient measures 7.0 mmHg. Aortic valve peak gradient  measures 12.7 mmHg. Aortic valve area, by VTI measures 2.28 cm. Pulmonic Valve: The pulmonic valve was normal in structure. Pulmonic valve regurgitation is trivial. No evidence of pulmonic stenosis. Aorta: The aortic root is normal in size and structure. Ascending aorta measurements are within normal limits for age when indexed to body surface area. Venous: The inferior vena cava is dilated in size with less than 50% respiratory variability, suggesting right atrial pressure of 15 mmHg. IAS/Shunts: No atrial level shunt detected by color flow Doppler.  LEFT VENTRICLE PLAX 2D LVIDd:         5.10 cm      Diastology LVIDs:         3.50 cm      LV e' medial:    7.51 cm/s LV PW:  1.40 cm      LV E/e' medial:  15.3 LV IVS:        1.20 cm      LV e' lateral:   11.30 cm/s LVOT diam:     2.40 cm      LV E/e' lateral: 10.2 LV SV:         71 LV SV Index:   30 LVOT Area:     4.52 cm  LV Volumes (MOD) LV vol d, MOD A2C: 109.0 ml LV vol d, MOD A4C: 139.0 ml LV vol s, MOD A2C: 51.2 ml LV vol s, MOD A4C: 63.7 ml LV SV MOD A2C:     57.8 ml LV SV MOD A4C:     139.0 ml LV SV MOD BP:      66.1 ml RIGHT VENTRICLE             IVC RV S prime:     18.50 cm/s  IVC diam: 3.10 cm TAPSE (M-mode): 1.9 cm LEFT ATRIUM             Index        RIGHT ATRIUM           Index LA diam:        5.30 cm 2.26 cm/m   RA Area:     26.43 cm LA Vol (A2C):   75.6 ml 32.19 ml/m  RA Volume:   86.37 ml  36.77 ml/m LA Vol (A4C):   89.1 ml 37.94 ml/m LA Biplane Vol: 84.8 ml 36.11 ml/m  AORTIC VALVE AV Area (Vmax):    2.26 cm AV Area (Vmean):   2.34 cm AV Area (VTI):     2.28 cm AV Vmax:           178.00 cm/s AV Vmean:          119.000 cm/s AV VTI:            0.311 m AV Peak Grad:      12.7 mmHg AV Mean Grad:      7.0 mmHg LVOT Vmax:         89.10 cm/s LVOT Vmean:        61.600 cm/s LVOT VTI:          0.157 m LVOT/AV VTI ratio: 0.50  AORTA Ao Root diam: 3.60 cm Ao Asc diam:  3.90 cm MITRAL VALVE                TRICUSPID VALVE MV Area (PHT): 6.65 cm      TR Peak grad:   13.8 mmHg MV Decel Time: 114 msec     TR Vmax:        186.00 cm/s MV E velocity: 115.00 cm/s                             SHUNTS                             Systemic VTI:  0.16 m                             Systemic Diam: 2.40 cm Grady Lawman MD Electronically signed by Grady Lawman MD Signature Date/Time: 07/04/2023/3:04:49 PM    Final    MR ABDOMEN MRCP W WO CONTAST Result Date: 07/04/2023 CLINICAL DATA:  Gallstones. Abdominal pain. History bladder  cancer. EXAM: MRI ABDOMEN WITHOUT AND WITH CONTRAST (INCLUDING MRCP) TECHNIQUE: Multiplanar multisequence MR imaging of the abdomen was performed both before and after the administration of intravenous contrast. Heavily T2-weighted images of the biliary and pancreatic ducts were obtained, and three-dimensional MRCP images were rendered by post processing. CONTRAST:  10mL GADAVIST GADOBUTROL 1 MMOL/ML IV SOLN COMPARISON:  CT AP 07/03/2023 FINDINGS: Lower chest: No acute abnormality. Hepatobiliary: Mild liver steatosis. Cyst within segment 4 B measures 2.4 cm, image 25/19. No enhancing liver lesions. Stones identified within the dependent portion of the gallbladder measuring up to 5.1 mm. Gallbladder wall thickening and edema measures up to 9 mm, image 13/4. The common bile duct measures 8 mm in maximum dimension. Multiple stones within the distal common bile duct are noted (at least 5). The largest of these measures 5 mm, image 19/4. Pancreas: No mass, inflammatory changes, or other parenchymal abnormality identified. Spleen: Normal in size without suspicious abnormality. Chronic areas of scarring within the splenic parenchyma identified Adrenals/Urinary Tract: Right adrenal gland is normal. Left adrenal nodule measures 1 cm. Small myelolipoma within the left adrenal gland measures 1.1 cm, image 30/4, image 16/8. No follow-up imaging recommended. No hydronephrosis. Mild left-sided perinephric soft tissue stranding. Upper pole left kidney cysts  containing thin, internal area of hairline septation measures 2.8 cm and is compatible with a benign Bosniak class 2 cyst. No follow-up imaging recommended. Stomach/Bowel: The stomach appears normal. No pathologic dilatation of the large or small bowel loops. Vascular/Lymphatic: No pathologically enlarged lymph nodes identified. No abdominal aortic aneurysm demonstrated. Other:  No free fluid or fluid collections. Musculoskeletal: Choose 1 IMPRESSION: 1. Cholelithiasis with gallbladder wall thickening and edema compatible with acute cholecystitis. 2. Multiple stones within the distal common bile duct (at least 5). The largest of these measures 5 mm. 3. Mild liver steatosis. 4. Small left adrenal myelolipoma. No follow-up imaging recommended. 5. Mild left-sided perinephric soft tissue stranding. Correlate for any clinical signs or symptoms of pyelonephritis. Electronically Signed   By: Kimberley Penman M.D.   On: 07/04/2023 05:24   CT ABDOMEN PELVIS W CONTRAST Result Date: 07/03/2023 CLINICAL DATA:  Abdominal pain, acute, nonlocalized. History of urinary bladder cancer. * Tracking Code: BO * EXAM: CT ABDOMEN AND PELVIS WITH CONTRAST TECHNIQUE: Multidetector CT imaging of the abdomen and pelvis was performed using the standard protocol following bolus administration of intravenous contrast. RADIATION DOSE REDUCTION: This exam was performed according to the departmental dose-optimization program which includes automated exposure control, adjustment of the mA and/or kV according to patient size and/or use of iterative reconstruction technique. CONTRAST:  75mL OMNIPAQUE  IOHEXOL  350 MG/ML SOLN COMPARISON:  CT scan chest, abdomen and pelvis from 06/08/2023. FINDINGS: Lower chest: There are subpleural atelectatic changes in the visualized lung bases. No overt consolidation. No pleural effusion. The heart is normal in size. No pericardial effusion. Hepatobiliary: The liver is normal in size. Non-cirrhotic configuration. No  suspicious mass. These is mild diffuse hepatic steatosis. There is a subcapsular simple cyst measuring 2.0 x 2.5 cm in the left hepatic lobe, segment 4A. The gallbladder is distended and exhibit mild diffuse gallbladder wall thickening and mild to moderate pericholecystic fat stranding. There are several, sub 5 mm calcified gallstones. No intrahepatic bile duct dilation. The extrahepatic bile duct is also not dilated however, there are at least 4, 2-5 mm sized calculi in the distal extrahepatic bile duct. Findings favor acute cholecystitis and choledocholithiasis. Pancreas: Unremarkable. No pancreatic ductal dilatation or surrounding inflammatory changes. Spleen: Within normal limits.  No focal lesion. 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 Unremarkable right adrenal gland. No suspicious renal mass. There is a 1.9 x 2.7 cm cyst arising from the left kidney upper pole which exhibit thin intervening partially calcified septum. No discrete mural nodule. No nephroureterolithiasis or obstructive uropathy on either side. Focal scarring noted in the right kidney lower pole, laterally. Urinary bladder is under distended, precluding optimal assessment. However, no large mass or stones identified. No perivesical fat stranding. Stomach/Bowel: No disproportionate dilation of the small or large bowel loops. No evidence of abnormal bowel wall thickening or inflammatory changes. The appendix is unremarkable. There is fecalization of several small bowel loops, which is nonspecific but can be seen with slow transit time/incompetent ileocecal valve. No bowel obstruction. Vascular/Lymphatic: No ascites or pneumoperitoneum. There is a stable oval 9 x 13 mm right retrocrural hypoattenuating lesion, which is tubular and favored to represent probable thrombosed vascular structure. No abdominal or pelvic lymphadenopathy, by size criteria. No aneurysmal dilation of the major abdominal arteries. There  are mild peripheral atherosclerotic vascular calcifications of the aorta and its major branches. Reproductive: Normal size prostate. Symmetric seminal vesicles. Other: There are bilateral small fat containing inguinal hernias. The soft tissues and abdominal wall are otherwise unremarkable. Musculoskeletal: No suspicious osseous lesions. There are mild - moderate multilevel degenerative changes in the visualized spine. IMPRESSION: 1. Findings favor acute cholecystitis and choledocholithiasis. 2. No metastatic bladder carcinoma in the abdomen or pelvis. 3. Multiple other nonacute observations, as described above. Aortic Atherosclerosis (ICD10-I70.0). Electronically Signed   By: Beula Brunswick M.D.   On: 07/03/2023 14:52       Assessment / Plan:    #69 68 year old male with history of atrial fibrillation-not on meds, diabetes mellitus and history of bladder cancer who presented after abrupt onset of abdominal pain nausea vomiting and diarrhea. Diarrhea resolved Noted to have elevated LFTs with T. bili of 8 and WBC 15 on presentation CT showed cholelithiasis probable cholecystitis and suspected choledocholithiasis with normal caliber CBD  /MRCP yesterday confirmed choledocholithiasis and also findings consistent with cholecystitis  Patient has been on IV Zosyn Leukocytosis improved LFTs stable bili 9.0 today  Plan; Unfortunately are not able to accommodate patient for ERCP today Allow low-fat diet, then n.p.o. after midnight Patient has been rescheduled with Dr. Brice Campi for tomorrow 07/06/2023.  I explained this to the patient and he understands. Also related to patient that tomorrow is a holiday and depending on the number of emergencies etc. it is possible that procedure may get bumped again tomorrow.  Continue Zosyn repeat labs in a.m.  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

## 2023-07-05 NOTE — Progress Notes (Signed)
   Patient Name: Darius [name] Date of Encounter: 07/05/2023 [facility] Cardiologist: Peder Bourdon, MD   Interval Summary  .    Echo and EKG stable.   Vital Signs .    Vitals:   07/04/23 1729 07/04/23 2131 07/05/23 0453 07/05/23 0756  BP: (!) 165/94 (!) 172/106 125/89 125/87  Pulse: 89 85 96 87  Resp:  17 19 17   Temp: 98.5 F (36.9 C) 98.4 F (36.9 C) 98.2 F (36.8 C) 97.8 F (36.6 C)  TempSrc: Oral Oral Oral Oral  SpO2: 97% 98% 98% 99%  Weight:      Height:        Intake/Output Summary (Last 24 hours) at 07/05/2023 0826 Last data filed at 07/05/2023 0454 Gross per 24 hour  Intake --  Output 925 ml  Net -925 ml      07/03/2023   11:17 AM 04/21/2023    2:47 PM 03/18/2023    2:12 PM  Last 3 Weights  Weight (lbs) 240 lb 240 lb 234 lb  Weight (kg) 108.863 kg 108.863 kg 106.142 kg      Telemetry/ECG    Afib cvr - Personally Reviewed  Physical Exam .   GEN: No acute distress.   Neck: No JVD Cardiac: iRRR, no murmurs, rubs, or gallops.  Respiratory: Clear to auscultation bilaterally. GI: Soft, nontender, non-distended  MS: No edema  Assessment & Plan .     #Preoperative risk stratification - Echo and EKG grossly stable from prior.  -The patient is high risk for intermediate risk procedure/general anesthesia.  No further cardiovascular testing is required prior to the procedure.  If this level of risk is acceptable to the patient and surgical team, the patient should be considered optimized from a cardiovascular standpoint. Risk level felt to be nonmodifiable at this time, and contributed to by untreated OSA, poorly controlled diabetes, unanticoagulated atrial fibrillation.    #PVCs vs aberrancy #NSVT - metoprolol  12.5 mg BID. Vitals stable.   #HFrecEF #NICM - NYHA II -  echo stable. - not currently on any GDMT   #Atrial fibrillation, permanent - not on anticoagulation due to medication nonadherence, would not resume now due to upcoming  procedures.   #Acute cholecystitis/choledocholithiasis #DM2 #HLD -statin on hold due to transaminitis.    #OSA not on CPAP   Risk Assessment/Risk Scores:         [place]  Heart Association (NYHA) Functional Class NYHA Class II   CHA2DS2-VASc Score = 3   This indicates a 3.2% annual risk of stroke. The patient's score is based upon: CHF History: 1 HTN History: 0 Diabetes History: 1 Stroke History: 0 Vascular Disease History: 0 Age Score: 1 Gender Score: 0 For questions or updates, please contact [facility] Please consult [email or web address] for contact info under        Signed, Sundae Maners A Karel Mowers, MD

## 2023-07-06 ENCOUNTER — Inpatient Hospital Stay (HOSPITAL_COMMUNITY): Admitting: Anesthesiology

## 2023-07-06 ENCOUNTER — Inpatient Hospital Stay (HOSPITAL_COMMUNITY)

## 2023-07-06 ENCOUNTER — Encounter (HOSPITAL_COMMUNITY)
Admission: EM | Disposition: A | Discharge status: Home / Self Care | Payer: Self-pay | Source: Home / Self Care | Attending: Family Medicine

## 2023-07-06 ENCOUNTER — Encounter (HOSPITAL_COMMUNITY): Payer: Self-pay | Admitting: Internal Medicine

## 2023-07-06 DIAGNOSIS — K831 Obstruction of bile duct: Secondary | ICD-10-CM

## 2023-07-06 DIAGNOSIS — K838 Other specified diseases of biliary tract: Secondary | ICD-10-CM | POA: Diagnosis not present

## 2023-07-06 DIAGNOSIS — B9681 Helicobacter pylori [H. pylori] as the cause of diseases classified elsewhere: Secondary | ICD-10-CM

## 2023-07-06 DIAGNOSIS — K2980 Duodenitis without bleeding: Secondary | ICD-10-CM

## 2023-07-06 DIAGNOSIS — K2950 Unspecified chronic gastritis without bleeding: Secondary | ICD-10-CM

## 2023-07-06 DIAGNOSIS — K3189 Other diseases of stomach and duodenum: Secondary | ICD-10-CM

## 2023-07-06 DIAGNOSIS — K810 Acute cholecystitis: Principal | ICD-10-CM

## 2023-07-06 DIAGNOSIS — K2990 Gastroduodenitis, unspecified, without bleeding: Secondary | ICD-10-CM

## 2023-07-06 DIAGNOSIS — K2970 Gastritis, unspecified, without bleeding: Secondary | ICD-10-CM

## 2023-07-06 DIAGNOSIS — I4891 Unspecified atrial fibrillation: Secondary | ICD-10-CM

## 2023-07-06 DIAGNOSIS — K8051 Calculus of bile duct without cholangitis or cholecystitis with obstruction: Secondary | ICD-10-CM

## 2023-07-06 DIAGNOSIS — R7989 Other specified abnormal findings of blood chemistry: Secondary | ICD-10-CM

## 2023-07-06 HISTORY — PX: ERCP: SHX5425

## 2023-07-06 HISTORY — PX: BILIARY STENT PLACEMENT: SHX5538

## 2023-07-06 HISTORY — PX: BIOPSY OF SKIN SUBCUTANEOUS TISSUE AND/OR MUCOUS MEMBRANE: SHX6741

## 2023-07-06 HISTORY — PX: BILIARY BRUSHING: SHX6843

## 2023-07-06 HISTORY — PX: SPHINCTEROTOMY: SHX5279

## 2023-07-06 LAB — COMPREHENSIVE METABOLIC PANEL WITH GFR
ALT: 79 U/L — ABNORMAL HIGH (ref 0–44)
AST: 33 U/L (ref 15–41)
Albumin: 2.2 g/dL — ABNORMAL LOW (ref 3.5–5.0)
Alkaline Phosphatase: 183 U/L — ABNORMAL HIGH (ref 38–126)
Anion gap: 11 (ref 5–15)
BUN: 15 mg/dL (ref 8–23)
CO2: 21 mmol/L — ABNORMAL LOW (ref 22–32)
Calcium: 8.5 mg/dL — ABNORMAL LOW (ref 8.9–10.3)
Chloride: 99 mmol/L (ref 98–111)
Creatinine, Ser: 1.1 mg/dL (ref 0.61–1.24)
GFR, Estimated: >60 mL/min (ref >60)
Glucose, Bld: 104 mg/dL — ABNORMAL HIGH (ref 70–99)
Potassium: 3.4 mmol/L — ABNORMAL LOW (ref 3.5–5.1)
Sodium: 131 mmol/L — ABNORMAL LOW (ref 135–145)
Total Bilirubin: 10.8 mg/dL — ABNORMAL HIGH (ref 0.0–1.2)
Total Protein: 6.1 g/dL — ABNORMAL LOW (ref 6.5–8.1)

## 2023-07-06 LAB — CBC WITH DIFFERENTIAL/PLATELET
Abs Immature Granulocytes: 0 10*3/uL (ref 0.00–0.07)
Basophils Absolute: 0.3 10*3/uL — ABNORMAL HIGH (ref 0.0–0.1)
Basophils Relative: 2 %
Eosinophils Absolute: 3.9 10*3/uL — ABNORMAL HIGH (ref 0.0–0.5)
Eosinophils Relative: 30 %
HCT: 46.1 % (ref 39.0–52.0)
Hemoglobin: 16 g/dL (ref 13.0–17.0)
Lymphocytes Relative: 2 %
Lymphs Abs: 0.3 10*3/uL — ABNORMAL LOW (ref 0.7–4.0)
MCH: 29.5 pg (ref 26.0–34.0)
MCHC: 34.7 g/dL (ref 30.0–36.0)
MCV: 84.9 fL (ref 80.0–100.0)
Monocytes Absolute: 0.1 10*3/uL (ref 0.1–1.0)
Monocytes Relative: 1 %
Neutro Abs: 8.5 10*3/uL — ABNORMAL HIGH (ref 1.7–7.7)
Neutrophils Relative %: 65 %
Platelets: 161 10*3/uL (ref 150–400)
RBC: 5.43 MIL/uL (ref 4.22–5.81)
RDW: 13.5 % (ref 11.5–15.5)
WBC: 13.1 10*3/uL — ABNORMAL HIGH (ref 4.0–10.5)
nRBC: 0 % (ref 0.0–0.2)
nRBC: 0 /100{WBCs}

## 2023-07-06 LAB — GLUCOSE, CAPILLARY
Glucose-Capillary: 114 mg/dL — ABNORMAL HIGH (ref 70–99)
Glucose-Capillary: 126 mg/dL — ABNORMAL HIGH (ref 70–99)
Glucose-Capillary: 134 mg/dL — ABNORMAL HIGH (ref 70–99)
Glucose-Capillary: 210 mg/dL — ABNORMAL HIGH (ref 70–99)
Glucose-Capillary: 212 mg/dL — ABNORMAL HIGH (ref 70–99)

## 2023-07-06 SURGERY — ERCP, WITH INTERVENTION IF INDICATED
Anesthesia: General

## 2023-07-06 MED ORDER — LACTATED RINGERS IV SOLN: INTRAVENOUS | Status: DC | PRN

## 2023-07-06 MED ORDER — GLUCAGON HCL RDNA (DIAGNOSTIC) 1 MG IJ SOLR
INTRAMUSCULAR | Status: DC | PRN
  Administered 2023-07-06: .25 mg via INTRAVENOUS

## 2023-07-06 MED ORDER — PANTOPRAZOLE SODIUM 40 MG PO TBEC
40.0000 mg | DELAYED_RELEASE_TABLET | Freq: Every day | ORAL | Status: DC
  Administered 2023-07-06 – 2023-07-08 (×3): 40 mg via ORAL
  Filled 2023-07-06 (×3): qty 1

## 2023-07-06 MED ORDER — DICLOFENAC SUPPOSITORY 100 MG
RECTAL | Status: DC | PRN
  Administered 2023-07-06: 100 mg via RECTAL

## 2023-07-06 MED ORDER — DICLOFENAC SUPPOSITORY 100 MG
RECTAL | Status: AC
  Filled 2023-07-06: qty 1

## 2023-07-06 MED ORDER — SODIUM CHLORIDE 0.9 % IV SOLN
INTRAVENOUS | Status: DC | PRN
  Administered 2023-07-06: 25 mL

## 2023-07-06 MED ORDER — ROCURONIUM BROMIDE 10 MG/ML (PF) SYRINGE
PREFILLED_SYRINGE | INTRAVENOUS | Status: DC | PRN
  Administered 2023-07-06: 60 mg via INTRAVENOUS

## 2023-07-06 MED ORDER — POTASSIUM CHLORIDE CRYS ER 20 MEQ PO TBCR
40.0000 meq | EXTENDED_RELEASE_TABLET | Freq: Once | ORAL | Status: AC
  Administered 2023-07-06: 40 meq via ORAL
  Filled 2023-07-06: qty 2

## 2023-07-06 MED ORDER — METOPROLOL TARTRATE 25 MG PO TABS
25.0000 mg | ORAL_TABLET | Freq: Two times a day (BID) | ORAL | Status: DC
  Administered 2023-07-06 – 2023-07-08 (×4): 25 mg via ORAL
  Filled 2023-07-06 (×5): qty 1

## 2023-07-06 MED ORDER — DICLOFENAC SUPPOSITORY 100 MG
100.0000 mg | Freq: Once | RECTAL | Status: DC
  Filled 2023-07-06: qty 1

## 2023-07-06 MED ORDER — SUGAMMADEX SODIUM 200 MG/2ML IV SOLN
INTRAVENOUS | Status: DC | PRN
  Administered 2023-07-06: 217.8 mg via INTRAVENOUS

## 2023-07-06 MED ORDER — ESMOLOL HCL 100 MG/10ML IV SOLN
INTRAVENOUS | Status: DC | PRN
  Administered 2023-07-06: 30 mg via INTRAVENOUS
  Administered 2023-07-06: 40 mg via INTRAVENOUS
  Administered 2023-07-06: 30 mg via INTRAVENOUS

## 2023-07-06 MED ORDER — GLUCAGON HCL RDNA (DIAGNOSTIC) 1 MG IJ SOLR
INTRAMUSCULAR | Status: AC
  Filled 2023-07-06: qty 1

## 2023-07-06 MED ORDER — BISACODYL 10 MG RE SUPP
10.0000 mg | Freq: Once | RECTAL | Status: AC
Start: 2023-07-06 — End: 2023-07-06
  Administered 2023-07-06: 10 mg via RECTAL
  Filled 2023-07-06 (×2): qty 1

## 2023-07-06 MED ORDER — DEXAMETHASONE SODIUM PHOSPHATE 10 MG/ML IJ SOLN
INTRAMUSCULAR | Status: DC | PRN
  Administered 2023-07-06: 10 mg via INTRAVENOUS

## 2023-07-06 MED ORDER — PROPOFOL 10 MG/ML IV BOLUS
INTRAVENOUS | Status: DC | PRN
  Administered 2023-07-06: 180 mg via INTRAVENOUS
  Administered 2023-07-06: 20 mg via INTRAVENOUS

## 2023-07-06 MED ORDER — ONDANSETRON HCL 4 MG/2ML IJ SOLN
INTRAMUSCULAR | Status: DC | PRN
  Administered 2023-07-06: 4 mg via INTRAVENOUS

## 2023-07-06 NOTE — Op Note (Signed)
 [facility] Patient Name: Darius [name] Procedure Date : 07/06/2023 MRN: [id number] Attending MD: Yong Henle , MD, [id number] Date of Birth: 29-Apr-1955 CSN: [id number] Age: 68 Admit Type: Inpatient Procedure:                ERCP Indications:              Bile duct stone(s), Abnormal MRCP, Jaundice,                            Elevated liver enzymes Providers:                Yong Henle, MD, Ila Malay, RN, Millicent Ally, RN, Gabino Joe, Technician Referring MD:              Medicines:                General Anesthesia, Diclofenac  100 mg rectal,                            Glucagon  0.5 mg IV Complications:            No immediate complications. Estimated Blood Loss:     Estimated blood loss was minimal. Procedure:                Pre-Anesthesia Assessment:                           - Prior to the procedure, a History and Physical                            was performed, and patient medications and                            allergies were reviewed. The patient's tolerance of                            previous anesthesia was also reviewed. The risks                            and benefits of the procedure and the sedation                            options and risks were discussed with the patient.                            All questions were answered, and informed consent                            was obtained. Prior Anticoagulants: The patient has                            taken no anticoagulant or antiplatelet agents. ASA  Grade Assessment: III - A patient with severe                            systemic disease. After reviewing the risks and                            benefits, the patient was deemed in satisfactory                            condition to undergo the procedure.                           After obtaining informed consent, the scope was                            passed under  direct vision. Throughout the                            procedure, the patient's blood pressure, pulse, and                            oxygen saturations were monitored continuously. The                            TJF-Q190V ([id number]) Olympus duodenoscope was                            introduced through the mouth, and used to inject                            contrast into and used to inject contrast into the                            bile duct. The ERCP was accomplished without                            difficulty. The patient tolerated the procedure. Scope In: Scope Out: Findings:      The scout film was normal.      The upper GI tract was traversed under direct vision without detailed       examination. Patchy granular mucosa was found in the entire examined       stomach - biopsied for HP evaluation. Patchy mild inflammation       characterized by erosions, friability and granularity was found in the       duodenal bulb, in the first portion of the duodenum and in the second       portion of the duodenum. The major papilla was enlarged and congested.      A 0.035 inch x 260 cm straight Hydra Jagwire was passed into the biliary       tree. The short-nosed traction sphincterotome was passed over the       guidewire and the bile duct was then deeply cannulated. Contrast was       injected. I personally interpreted the bile duct images. Ductal flow of  contrast was adequate. Image quality was adequate. Contrast extended to       the hepatic ducts. Opacification of the entire biliary tree except for       the cystic duct and gallbladder was successful. The lower third of the       main bile duct and middle third of the main bile duct were normal in       caliber (4-5 mm). The common hepatic duct to bifurcation contained a       single mild narrowing <5 mm in length. The left main hepatic duct was       mildly dilated. The largest diameter was 10 mm. The cystic duct junction        could not be visualized. An 8 mm biliary sphincterotomy was made with a       monofilament traction (standard) sphincterotome using ERBE       electrocautery. There was no post-sphincterotomy bleeding. To discover       objects, the biliary tree was swept with a retrieval balloon. Sludge was       swept from the duct. One stone was removed. No other stones remained. An       occlusion cholangiogram was performed that showed no further significant       biliary pathology other than what was noted above - query possible       MIrrizzi (no evidence of any other mass/lesion on imaging noted). Cells       for cytology were obtained by brushing in the CHD/bifurcation region.       Due to the patient's LFT pattern and what could be possible Mirrizzi,       decision was made to place a biliary stent to ensure decompression. One       10 Fr by 12 cm plastic biliary stent with a single external flap and a       single internal flap was placed into the common bile duct. The stent was       in good position.      A pancreatogram was not performed.      The duodenoscope was withdrawn from the patient. Impression:               - Granular gastric mucosa. Biopsied for HP                            evaluation.                           - Duodenitis.                           - The major papilla appeared to be enlarged and                            congested.                           - A single mild biliary narrowing was found in the                            common hepatic duct. 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 This was brushed for cytology.                           - The left main hepatic duct was mildly dilated.                           - Small amount of choledocholithiasis was found.                            Complete removal was accomplished by biliary                            sphincterotomy  and balloon sweep. However, compared                            to MRCP, there was not amount of CDL as was                            expected (query it is in cystic duct that is                            occluded at this time) as the Cystic duct did not                            fill.                           - One plastic biliary stent was placed into the                            common bile duct into the left hepatic system. Recommendation:           - The patient will be observed post-procedure,                            until all discharge criteria are met.                           - Return patient to hospital ward for ongoing care.                           - Observe patient's clinical course.                           - Check liver enzymes (AST, ALT, alkaline                            phosphatase, bilirubin) in the morning.                           - Watch for pancreatitis, bleeding, perforation,  and cholangitis.                           - With potential for underlying Mirrizzi, I think                            it still makes sense to move forward with                            cholecystectomy during this admission. Timing to be                            determined by surgical service.                           - Will need to have monitoring of LFTs as                            outpatient.                           - Repeat ERCP in 3 months to remove stent.                           - If removal of gallbladder and biliary obstruction                            does not lead to complete improvement in LFT                            pattern, will need to consider intrinsic liver                            disease evaluation.                           - May restart DOAC if necessary in 48 hours, but                            with possible Cholecystectomy, will defer to                            medicine/surgical service as to timing of  restart.                           - Start PPI 40 mg daily.                           - The findings and recommendations were discussed                            with the patient.                           - The findings and recommendations were discussed  with the referring physician. Procedure Code(s):        --- Professional ---                           [phone], Endoscopic retrograde                            cholangiopancreatography (ERCP); with placement of                            endoscopic stent into biliary or pancreatic duct,                            including pre- and post-dilation and guide wire                            passage, when performed, including sphincterotomy,                            when performed, each stent                           43264, Endoscopic retrograde                            cholangiopancreatography (ERCP); with removal of                            calculi/debris from biliary/pancreatic duct(s)                           [id number], Endoscopic catheterization of the biliary                            ductal system, radiological supervision and                            interpretation Diagnosis Code(s):        --- Professional ---                           K31.89, Other diseases of stomach and duodenum                           K29.80, Duodenitis without bleeding                           K83.8, Other specified diseases of biliary tract                           K80.51, Calculus of bile duct without cholangitis                            or cholecystitis with obstruction                           R17, Unspecified jaundice  R74.8, Abnormal levels of other serum enzymes                           R93.2, Abnormal findings on diagnostic imaging of                            liver and biliary tract CPT copyright 2022 [employer]. All rights reserved. The codes documented in this  report are preliminary and upon coder review may  be revised to meet current compliance requirements. Yong Henle, MD 07/06/2023 11:59:07 AM Number of Addenda: 0

## 2023-07-06 NOTE — Interval H&P Note (Signed)
 History and Physical Interval Note:  07/06/2023 9:28 AM  Darius [name]  has presented today for surgery, with the diagnosis of abnormal lfts possible choledocholithiasis.  The various methods of treatment have been discussed with the patient and family. After consideration of risks, benefits and other options for treatment, the patient has consented to  Procedure(s): ERCP, WITH INTERVENTION IF INDICATED (N/A) as a surgical intervention.  The patient's history has been reviewed, patient examined, no change in status, stable for surgery.  I have reviewed the patient's chart and labs.  Questions were answered to the patient's satisfaction.    The risks of an ERCP were discussed at length, including but not limited to the risk of perforation, bleeding, abdominal pain, post-ERCP pancreatitis (while usually mild can be severe and even life threatening).   [name]

## 2023-07-06 NOTE — Anesthesia Procedure Notes (Signed)
 Procedure Name: Intubation Date/Time: 07/06/2023 10:40 AM  Performed by: Robert Chimes, CRNAPre-anesthesia Checklist: Patient identified, Emergency Drugs available, Suction available and Patient being monitored Patient Re-evaluated:Patient Re-evaluated prior to induction Oxygen Delivery Method: Circle system utilized Preoxygenation: Pre-oxygenation with 100% oxygen Induction Type: IV induction Ventilation: Mask ventilation without difficulty Laryngoscope Size: Mac and 3 Grade View: Grade II Tube type: Oral Tube size: 7.5 mm Number of attempts: 1 Airway Equipment and Method: Stylet and Oral airway Placement Confirmation: ETT inserted through vocal cords under direct vision, positive ETCO2 and breath sounds checked- equal and bilateral Secured at: 23 cm Tube secured with: Tape Dental Injury: Teeth and Oropharynx as per pre-operative assessment

## 2023-07-06 NOTE — Progress Notes (Signed)
 Progress Note  Patient Name: Darius [name] Date of Encounter: 07/06/2023  Primary Cardiologist: Peder Bourdon, MD  Interval Summary   Interval chart reviewed.  Patient reports no sense of palpitations or chest discomfort.  Heart rate up moving around in the room this morning.  Complains of abdominal fullness.  Vital Signs    Vitals:   07/05/23 0756 07/05/23 1602 07/05/23 2058 07/06/23 0538  BP: 125/87 123/78 (!) 139/92 (!) 136/91  Pulse: 87 (!) 107 99   Resp: 17 16 19 18   Temp: 97.8 F (36.6 C) 98.1 F (36.7 C) 98.2 F (36.8 C) 97.9 F (36.6 C)  TempSrc: Oral Oral Oral Oral  SpO2: 99% 100% 94% 96%  Weight:      Height:        Intake/Output Summary (Last 24 hours) at 07/06/2023 0849 Last data filed at 07/06/2023 0551 Gross per 24 hour  Intake --  Output 275 ml  Net -275 ml   Filed Weights   07/03/23 1117  Weight: 108.9 kg    Physical Exam   GEN: No acute distress.   Neck: No JVD. Cardiac: Irregularly irregular without gallop.  Respiratory: Nonlabored. Clear to auscultation bilaterally. GI: Bowel sounds present, mildly tender. MS: No edema.  ECG/Telemetry    Telemetry reviewed showing atrial fibrillation with RVR, occasional PVCs.  Labs    Chemistry Recent Labs  Lab 07/04/23 0304 07/05/23 0416 07/06/23 0655  NA 135 134* 131*  K 4.1 4.0 3.4*  CL 100 99 99  CO2 25 26 21*  GLUCOSE 97 121* 104*  BUN 12 13 15   CREATININE 1.35* 1.26* 1.10  CALCIUM  9.0 8.5* 8.5*  PROT 6.3* 6.3* 6.1*  ALBUMIN 2.9* 2.6* 2.2*  AST 80* 44* 33  ALT 165* 112* 79*  ALKPHOS 170* 181* 183*  BILITOT 6.6* 9.0* 10.8*  GFRNONAA 57* >60 >60  ANIONGAP 10 9 11     Hematology Recent Labs  Lab 07/04/23 0304 07/05/23 0416 07/06/23 0655  WBC 12.4* 13.1* 13.1*  RBC 5.21 5.28 5.43  HGB 15.2 15.4 16.0  HCT 44.7 44.9 46.1  MCV 85.8 85.0 84.9  MCH 29.2 29.2 29.5  MCHC 34.0 34.3 34.7  RDW 13.2 13.2 13.5  PLT 167 163 161   Lipid Panel     Component Value Date/Time    CHOL 188 02/18/2023 1151   CHOL 140 10/20/2019 1006   TRIG 147 02/18/2023 1151   HDL 51 02/18/2023 1151   HDL 53 10/20/2019 1006   CHOLHDL 3.7 02/18/2023 1151   VLDL 29 02/18/2023 1151   LDLCALC 108 (H) 02/18/2023 1151   LDLCALC 65 10/20/2019 1006   LDLCALC 42 12/12/2016 0928   LABVLDL 22 10/20/2019 1006    Cardiac Studies   Echocardiogram 07/04/2023:  1. Left ventricular ejection fraction, by estimation, is 50 to 55%. The  left ventricle has low normal function. The left ventricle has no regional  wall motion abnormalities. There is mild left ventricular hypertrophy.  Left ventricular diastolic  parameters are indeterminate.   2. Right ventricular systolic function is mildly reduced. The right  ventricular size is mildly enlarged. Tricuspid regurgitation signal is  inadequate for assessing PA pressure.   3. Left atrial size was mildly dilated.   4. Right atrial size was mildly dilated.   5. The mitral valve is grossly normal. Trivial mitral valve  regurgitation. No evidence of mitral stenosis.   6. The aortic valve is tricuspid. There is mild calcification of the  aortic valve. Aortic valve regurgitation  is not visualized. Aortic valve  sclerosis/calcification is present, without any evidence of aortic  stenosis.   7. The inferior vena cava is dilated in size with <50% respiratory  variability, suggesting right atrial pressure of 15 mmHg.   Assessment & Plan   1.  Preoperative cardiac assessment.  RCRI perioperative cardiac risk index of 3 points indicating 15% chance of major adverse cardiac event, overall high risk although not a modifiable risk and based on comorbidities.  Patient clinically stable from the perspective of HFrecEF with nonischemic cardiomyopathy, LVEF 50 to 55% by echocardiogram this admission.  He is in permanent atrial fibrillation with RVR by telemetry and not anticoagulated at this point.  No additional cardiac studies are planned at this time.  2.   Permanent atrial fibrillation with CHA2DS2-VASc score of 3.  History of medication nonadherence as an outpatient, currently not anticoagulated.  Currently on Lopressor  12.5 mg twice daily for concurrent treatment of PVCs and NSVT.  3.  History of nonischemic cardiomyopathy with HFrecEF, LVEF 50 to 55% by current echocardiogram.  RV contraction mildly reduced.  4.  OSA, not on CPAP.  5.  Choledocholithiasis/cholecystitis.  Plan is for ERCP today and potentially cholecystectomy during this admission.  Increase Lopressor  to 25 mg twice daily.  Our service will continue to follow.  For questions or updates, please contact [facility] Please consult [email or web address] for contact info under   Signed, Teddie Favre, MD  07/06/2023, 8:49 AM

## 2023-07-06 NOTE — Plan of Care (Signed)
  Problem: Coping: Goal: Level of anxiety will decrease Outcome: Progressing   Problem: Skin Integrity: Goal: Risk for impaired skin integrity will decrease Outcome: Progressing   Problem: Tissue Perfusion: Goal: Adequacy of tissue perfusion will improve Outcome: Progressing

## 2023-07-06 NOTE — Anesthesia Preprocedure Evaluation (Signed)
 Anesthesia Evaluation  Patient identified by MRN, date of birth, ID band Patient awake    Reviewed: Allergy & Precautions, NPO status , Patient's Chart, lab work & pertinent test results  History of Anesthesia Complications Negative for: history of anesthetic complications  Airway Mallampati: III  TM Distance: <3 FB Neck ROM: Full    Dental  (+) Dental Advisory Given, Poor Dentition, Missing, Chipped   Pulmonary neg shortness of breath, sleep apnea and Continuous Positive Airway Pressure Ventilation , neg COPD, neg recent URI   breath sounds clear to auscultation       Cardiovascular (-) hypertension(-) angina (-) CAD + dysrhythmias Atrial Fibrillation  Rhythm:Irregular Rate:Tachycardia  1. Left ventricular ejection fraction, by estimation, is 50 to 55%. The  left ventricle has low normal function. The left ventricle has no regional  wall motion abnormalities. There is mild left ventricular hypertrophy.  Left ventricular diastolic  parameters are indeterminate.   2. Right ventricular systolic function is mildly reduced. The right  ventricular size is mildly enlarged. Tricuspid regurgitation signal is  inadequate for assessing PA pressure.   3. Left atrial size was mildly dilated.   4. Right atrial size was mildly dilated.   5. The mitral valve is grossly normal. Trivial mitral valve  regurgitation. No evidence of mitral stenosis.   6. The aortic valve is tricuspid. There is mild calcification of the  aortic valve. Aortic valve regurgitation is not visualized. Aortic valve  sclerosis/calcification is present, without any evidence of aortic  stenosis.   7. The inferior vena cava is dilated in size with <50% respiratory  variability, suggesting right atrial pressure of 15 mmHg.      Neuro/Psych neg Seizures PSYCHIATRIC DISORDERS  Depression       GI/Hepatic negative GI ROS,,,Lab Results      Component                Value                Date                      ALT                      79 (H)              07/06/2023                AST                      33                  07/06/2023                ALKPHOS                  183 (H)             07/06/2023                BILITOT                  10.8 (H)            07/06/2023              Endo/Other  diabetes, Type 2    Renal/GU CRFRenal diseaseLab Results      Component  Value               Date                      NA                       131 (L)             07/06/2023                K                        3.4 (L)             07/06/2023                CO2                      21 (L)              07/06/2023                GLUCOSE                  104 (H)             07/06/2023                BUN                      15                  07/06/2023                CREATININE               1.10                07/06/2023                CALCIUM                   8.5 (L)             07/06/2023                GFR                      89.09               05/15/2014                EGFR                     51 (L)              03/18/2023                GFRNONAA                 >60                 07/06/2023                Musculoskeletal   Abdominal   Peds  Hematology  (+) Blood dyscrasia Lab Results      Component                Value  Date                      WBC                      13.1 (H)            07/06/2023                HGB                      16.0                07/06/2023                HCT                      46.1                07/06/2023                MCV                      84.9                07/06/2023                PLT                      161                 07/06/2023             Xarelto  held   Anesthesia Other Findings  abnormal lfts possible choledocholithiasis  Reproductive/Obstetrics                              Anesthesia Physical Anesthesia Plan  ASA:  3  Anesthesia Plan: General   Post-op Pain Management: Minimal or no pain anticipated   Induction: Intravenous  PONV Risk Score and Plan: 2 and Ondansetron  and Dexamethasone   Airway Management Planned: Oral ETT  Additional Equipment: None  Intra-op Plan:   Post-operative Plan: Extubation in OR  Informed Consent: I have reviewed the patients History and Physical, chart, labs and discussed the procedure including the risks, benefits and alternatives for the proposed anesthesia with the patient or authorized representative who has indicated his/her understanding and acceptance.     Dental advisory given  Plan Discussed with: CRNA  Anesthesia Plan Comments:          Anesthesia Quick Evaluation

## 2023-07-06 NOTE — Progress Notes (Signed)
 Subjective No acute events. Some MEG pain last night, better today  Objective: Vital signs in last 24 hours: Temp:  [97.9 F (36.6 C)-98.2 F (36.8 C)] 97.9 F (36.6 C) (05/26 0538) Pulse Rate:  [99-107] 107 (05/26 0903) Resp:  [16-19] 18 (05/26 0538) BP: (122-139)/(78-95) 122/95 (05/26 0903) SpO2:  [94 %-100 %] 97 % (05/26 0903) Last BM Date : 07/03/23  Intake/Output from previous day: 05/25 0701 - 05/26 0700 In: -  Out: 275 [Urine:275] Intake/Output this shift: No intake/output data recorded.  Gen: NAD, comfortable CV: RRR Pulm: Normal work of breathing Abd: Soft, NT/ND  Ext: SCDs in place  Lab Results: CBC  Recent Labs    07/05/23 0416 07/06/23 0655  WBC 13.1* 13.1*  HGB 15.4 16.0  HCT 44.9 46.1  PLT 163 161   BMET Recent Labs    07/05/23 0416 07/06/23 0655  NA 134* 131*  K 4.0 3.4*  CL 99 99  CO2 26 21*  GLUCOSE 121* 104*  BUN 13 15  CREATININE 1.26* 1.10  CALCIUM  8.5* 8.5*   PT/INR Recent Labs    07/04/23 0304  LABPROT 15.4*  INR 1.2   ABG No results for input(s): "PHART", "HCO3" in the last 72 hours.  Invalid input(s): "PCO2", "PO2"  Studies/Results:  Anti-infectives: Anti-infectives (From admission, onward)    Start     Dose/Rate Route Frequency Ordered Stop   07/03/23 2300  piperacillin-tazobactam (ZOSYN) IVPB 3.375 g        3.375 g 12.5 mL/hr over 240 Minutes Intravenous Every 8 hours 07/03/23 1612     07/03/23 1530  piperacillin-tazobactam (ZOSYN) IVPB 3.375 g        3.375 g 100 mL/hr over 30 Minutes Intravenous  Once 07/03/23 1515 07/03/23 1559        Assessment/Plan: Patient Active Problem List   Diagnosis Date Noted   Choledocholithiasis with acute cholecystitis 07/04/2023   Acute cholecystitis 07/03/2023   Hypotension, unspecified 02/17/2023   Stage 3b chronic kidney disease (HCC) 01/14/2023   Elevated PSA 01/14/2023   Class 3 severe obesity due to excess calories in adult 01/14/2023   Bladder cancer (HCC)  02/22/2021   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) 12/13/2020   Atrial fibrillation (HCC) 04/17/2016   Dilated cardiomyopathy (HCC) 04/19/2014   Type 2 diabetes mellitus (HCC) 04/09/2014   Non compliance with medical treatment and diet 04/09/2014   Chronic anticoagulation-Eliquis  started 04/06/14 04/09/2014   Abnormal chest CT-LUL nodule- needs f/u May 2016 04/09/2014   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  - GI planning ERCP-were unable to accommodate yesterday due to staffing availability.  Hopefully can go for ERCP today. - Cardiology for clearance for surgery underway -noted that he is a high risk for surgery with an RCRI 3, 15%.  There are however no modifiable risk factors that they feel could be optimized.  - patient may potentially benefit from cholecystectomy this admission once choledocholithiasis has been cleared     FEN - diet per primary/ GI VTE - SCD's, hold Xarelto  per primary ID - Zosyn Admit - TR[facility]; GI    LOS: 3 days  I spent a total of 35 minutes in both face-to-face and non-face-to-face activities, excluding procedures performed, for this visit on the date of this encounter.  Beatris Lincoln, MD [facility], A [facility]

## 2023-07-06 NOTE — Transfer of Care (Signed)
 Immediate Anesthesia Transfer of Care Note  Patient: Darius [name]  Procedure(s) Performed: ERCP, WITH INTERVENTION IF INDICATED SPHINCTEROTOMY, BILIARY  Patient Location: Endoscopy Unit  Anesthesia Type:General  Level of Consciousness: awake, alert , and oriented  Airway & Oxygen Therapy: Patient Spontanous Breathing  Post-op Assessment: Report given to RN and Post -op Vital signs reviewed and stable  Post vital signs: Reviewed and stable  Last Vitals:  Vitals Value Taken Time  BP 125/91 07/06/23 1140  Temp 98   Pulse 97 07/06/23 1145  Resp 22 07/06/23 1145  SpO2 92 % 07/06/23 1145  Vitals shown include unfiled device data.  Last Pain:  Vitals:   07/06/23 1134  TempSrc:   PainSc: Asleep         Complications: No notable events documented.

## 2023-07-06 NOTE — Progress Notes (Signed)
 PROGRESS NOTE    Darius [name]  ZOX:[id number] DOB: [birth year] DOA: 07/03/2023 PCP: Laneta Pintos, MD   Brief Narrative:  HPI: Darius [name] is a 68 y.o. male with medical history significant of diabetes, CKD 3B, atrial fibrillation, CHF, OSA, obesity, bladder cancer presenting with abdominal pain.   Patient reports 3 days of nonbloody diarrhea.  He started having moderate to severe abdominal pain last night.  Reports some nausea this morning.   Denies fevers, chills, chest pain, shortness of breath, constipation, vomiting.   ED Course: Vital signs in the ED notable for blood pressure in the 140s-150s systolic.  Lab workup included CMP with sodium 133, potassium 3.3, bicarb 21, creatinine 1.36, glucose 185, AST 142, ALT 215, alk phos 188, T. bili 8.4.  CBC with leukocytosis to 15.4.  Lipase normal.  Urinalysis with glucose, bilirubin, ketones, protein, leukocytes, bacteria.  Urine culture pending.  CT abdomen pelvis showed changes consistent with acute cholecystitis and acute choledocholithiasis, no evidence of static disease in the setting of prior bladder cancer.  No other acute normality.   General surgery consulted and will see the patient and requested GI consultation who will also see the patient and recommending MRCP.  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  > LFTs elevated with AST 142, ALT 215, alk phos 188, T. bili 8.4. Leukocytosis to 15.4. > General Surgery and  gastroenterology consulted.  Seen by GI, MRCP obtained which confirmed several stones in CBD.  Patient remains on Zosyn.  Symptoms have improved.  LFTs improving.  Per general surgery, he will need cholecystectomy during this hospitalization however GI and general surgery both requested cardiology clearance.  Cardiology consulted, EKG and echo both unremarkable. noted that he is a high risk for surgery with an RCRI 3, 15%.  There are however no modifiable risk factors that they feel could be optimized.  Bilirubin rising, LFTs improving.  ERCP could not be completed yesterday due to staffing issues, hoping that it would be done today.   Diabetes melitis type II, POA: > 20 units daily, semaglutide  at home along with metformin  but currently on 10 units of Lantus  and SSI and blood sugar control.   CKD 3B ruled out, CKD stage IIIa ruled in: Patient's baseline creatinine appears to be between 1.3-1.5 making it CKD stage IIIa, currently at baseline.  Hypokalemia: Low again, will replenish.  Atrial fibrillation - Rate is controlled, not on any rate control medications at home, Xarelto  on hold.   CHF with recovered ejection fraction > Last echo was 2023 with EF 50 of 80%, normal diastolic function, normal RV function. -Not currently taking any medications for this   History of bladder cancer - Noted   Obesity class I: Weight loss and dietary modification counseled.   OSA - Not on CPAP per chart   Hyperlipidemia: Holding statin due to elevated LFTs.  BPH: Resume Flomax .  Mild hyponatremia: Stable.  Constipation: Has not had bowel movements in several days.  Dulcolax suppository ordered per his  request.  DVT prophylaxis: SCDs Start: 07/03/23 1559   Code Status: Full Code  Family Communication:  None present at bedside.  Plan of care discussed with patient in length and he/she verbalized understanding and agreed with it.  Status is: Inpatient Remains inpatient appropriate because: Needs ERCP  followed by cholecystectomy.   Estimated body mass index is 30.81 kg/m as calculated from the following:   Height as of this encounter: 6\' 2"  (1.88 m).   Weight as of this encounter: 108.9 kg.    Nutritional Assessment: Body mass index is 30.81 kg/m.[name] Seen by dietician.  I agree with the assessment and plan as outlined below: Nutrition Status:        . Skin Assessment: I have examined the patient's skin and I agree with the wound assessment as performed by the wound care RN as outlined below:    Consultants:  Surgery and GI  Procedures:  As above  Antimicrobials:  Anti-infectives (From admission, onward)    Start     Dose/Rate Route Frequency Ordered Stop   07/03/23 2300  piperacillin-tazobactam (ZOSYN) IVPB 3.375 g        3.375 g 12.5 mL/hr over 240 Minutes Intravenous Every 8 hours 07/03/23 1612     07/03/23 1530  piperacillin-tazobactam (ZOSYN) IVPB 3.375 g        3.375 g 100 mL/hr over 30 Minutes Intravenous  Once 07/03/23 1515 07/03/23 1559         Subjective: Seen and examined.  Intermittent abdominal pain which improves with pain medications.  Main complaint is constipation today.  Passing flatus.  No other complaint.  Objective: Vitals:   07/05/23 1602 07/05/23 2058 07/06/23 0538 07/06/23 0903  BP: 123/78 (!) 139/92 (!) 136/91 (!) 122/95  Pulse: (!) 107 99  (!) 107  Resp: 16 19 18    Temp: 98.1 F (36.7 C) 98.2 F (36.8 C) 97.9 F (36.6 C)   TempSrc: Oral Oral Oral   SpO2: 100% 94% 96% 97%  Weight:      Height:        Intake/Output Summary (Last 24 hours) at 07/06/2023 0914 Last data filed at 07/06/2023 0900 Gross per 24 hour  Intake 0 ml  Output 275 ml  Net -275 ml   Filed Weights   07/03/23 1117  Weight: 108.9 kg    Examination:  General exam: Appears calm and comfortable  Respiratory system: Clear to auscultation. Respiratory effort normal. Cardiovascular system: S1 & S2 heard, RRR. No JVD, murmurs, rubs, gallops or clicks. No  pedal edema. Gastrointestinal system: Abdomen is distended, soft and tender at epigastrium and right upper quadrant. No organomegaly or masses felt. Normal bowel sounds heard. Central nervous system: Alert and oriented. No focal neurological deficits. Extremities: Symmetric 5 x 5 power. Skin: No rashes, lesions or ulcers.    Data Reviewed: I have personally reviewed following labs and imaging studies  CBC: Recent Labs  Lab 07/03/23 1119 07/04/23 0304 07/05/23 0416 07/06/23 0655  WBC 15.4* 12.4* 13.1* 13.1*  NEUTROABS  --   --  11.2* 8.5*  HGB 15.9 15.2 15.4 16.0  HCT 46.5 44.7 44.9 46.1  MCV 85.5 85.8 85.0 84.9  PLT 179 167 163 161   Basic Metabolic Panel: Recent Labs  Lab 07/03/23 1119 07/04/23 0304 07/05/23 0416 07/06/23 0655  NA 133* 135 134* 131*  K 3.3* 4.1 4.0 3.4*  CL 100 100 99 99  CO2 21* 25 26 21*  GLUCOSE 185* 97 121* 104*  BUN 14 12 13  15  CREATININE 1.36* 1.35* 1.26* 1.10  CALCIUM  9.2 9.0 8.5* 8.5*   GFR: Estimated Creatinine Clearance: 83.3 mL/min (by C-G formula based on SCr of 1.1 mg/dL). Liver Function Tests: Recent Labs  Lab 07/03/23 1119 07/04/23 0304 07/05/23 0416 07/06/23 0655  AST 142* 80* 44* 33  ALT 215* 165* 112* 79*  ALKPHOS 188* 170* 181* 183*  BILITOT 8.4* 6.6* 9.0* 10.8*  PROT 7.2 6.3* 6.3* 6.1*  ALBUMIN 3.5 2.9* 2.6* 2.2*   Recent Labs  Lab 07/03/23 1119  LIPASE 34   No results for input(s): "AMMONIA" in the last 168 hours. Coagulation Profile: Recent Labs  Lab 07/04/23 0304  INR 1.2   Cardiac Enzymes: No results for input(s): "CKTOTAL", "CKMB", "CKMBINDEX", "TROPONINI" in the last 168 hours. BNP (last 3 results) No results for input(s): "PROBNP" in the last 8760 hours. HbA1C: No results for input(s): "HGBA1C" in the last 72 hours. CBG: Recent Labs  Lab 07/04/23 2129 07/05/23 0754 07/05/23 1239 07/05/23 2102 07/06/23 0859  GLUCAP 143* 111* 94 152* 114*   Lipid Profile: No results for input(s): "CHOL",  "HDL", "LDLCALC", "TRIG", "CHOLHDL", "LDLDIRECT" in the last 72 hours. Thyroid  Function Tests: No results for input(s): "TSH", "T4TOTAL", "FREET4", "T3FREE", "THYROIDAB" in the last 72 hours. Anemia Panel: No results for input(s): "VITAMINB12", "FOLATE", "FERRITIN", "TIBC", "IRON", "RETICCTPCT" in the last 72 hours. Sepsis Labs: No results for input(s): "PROCALCITON", "LATICACIDVEN" in the last 168 hours.  Recent Results (from the past 240 hours)  Urine Culture     Status: Abnormal (Preliminary result)   Collection Time: 07/03/23 12:54 PM   Specimen: Urine, Clean Catch  Result Value Ref Range Status   Specimen Description URINE, CLEAN CATCH  Final   Special Requests NONE  Final   Culture (A)  Final    10,000 COLONIES/mL STAPHYLOCOCCUS EPIDERMIDIS 10,000 COLONIES/mL STAPHYLOCOCCUS SAPROPHYTICUS SUSCEPTIBILITIES TO FOLLOW Performed at [facility], [address], [place], [place] [address]    Report Status PENDING  Incomplete     Radiology Studies: ECHOCARDIOGRAM COMPLETE Result Date: 07/04/2023    ECHOCARDIOGRAM REPORT   Patient Name:   Darius [name] Date of Exam: 07/04/2023 Medical Rec #:  [id number]       Height:       74.0 in Accession #:    [id number]      Weight:       240.0 lb Date of Birth:  08-11-1955      BSA:          2.349 m Patient Age:    67 years        BP:           146/86 mmHg Patient Gender: M               HR:           91 bpm. Exam Location:  Inpatient Procedure: 2D Echo, Cardiac Doppler and Color Doppler (Both Spectral and Color            Flow Doppler were utilized during procedure). Indications:    Preoperative evaluation  History:        Patient has prior history of Echocardiogram examinations, most                 recent 12/30/2021. CHF and Cardiomyopathy, CKD, stage 3,                 Arrythmias:Atrial Fibrillation, Signs/Symptoms:Hypotension; Risk  Factors:Diabetes and Sleep Apnea.  Sonographer:    Terrilee Few RCS Referring Phys: [id number]  Ethelle Herb A ACHARYA IMPRESSIONS  1. Left ventricular ejection fraction, by estimation, is 50 to 55%. The left ventricle has low normal function. The left ventricle has no regional wall motion abnormalities. There is mild left ventricular hypertrophy. Left ventricular diastolic parameters are indeterminate.  2. Right ventricular systolic function is mildly reduced. The right ventricular size is mildly enlarged. Tricuspid regurgitation signal is inadequate for assessing PA pressure.  3. Left atrial size was mildly dilated.  4. Right atrial size was mildly dilated.  5. The mitral valve is grossly normal. Trivial mitral valve regurgitation. No evidence of mitral stenosis.  6. The aortic valve is tricuspid. There is mild calcification of the aortic valve. Aortic valve regurgitation is not visualized. Aortic valve sclerosis/calcification is present, without any evidence of aortic stenosis.  7. The inferior vena cava is dilated in size with <50% respiratory variability, suggesting right atrial pressure of 15 mmHg. FINDINGS  Left Ventricle: Left ventricular ejection fraction, by estimation, is 50 to 55%. The left ventricle has low normal function. The left ventricle has no regional wall motion abnormalities. The left ventricular internal cavity size was normal in size. There is mild left ventricular hypertrophy. Left ventricular diastolic parameters are indeterminate. Right Ventricle: The right ventricular size is mildly enlarged. No increase in right ventricular wall thickness. Right ventricular systolic function is mildly reduced. Tricuspid regurgitation signal is inadequate for assessing PA pressure. The tricuspid regurgitant velocity is 1.86 m/s, and with an assumed right atrial pressure of 15 mmHg, the estimated right ventricular systolic pressure is 28.8 mmHg. Left Atrium: Left atrial size was mildly dilated. Right Atrium: Right atrial size was mildly dilated. Pericardium: There is no evidence of pericardial effusion.  Mitral Valve: The mitral valve is grossly normal. Trivial mitral valve regurgitation. No evidence of mitral valve stenosis. Tricuspid Valve: The tricuspid valve is normal in structure. Tricuspid valve regurgitation is trivial. No evidence of tricuspid stenosis. Aortic Valve: The aortic valve is tricuspid. There is mild calcification of the aortic valve. Aortic valve regurgitation is not visualized. Aortic valve sclerosis/calcification is present, without any evidence of aortic stenosis. Aortic valve mean gradient measures 7.0 mmHg. Aortic valve peak gradient measures 12.7 mmHg. Aortic valve area, by VTI measures 2.28 cm. Pulmonic Valve: The pulmonic valve was normal in structure. Pulmonic valve regurgitation is trivial. No evidence of pulmonic stenosis. Aorta: The aortic root is normal in size and structure. Ascending aorta measurements are within normal limits for age when indexed to body surface area. Venous: The inferior vena cava is dilated in size with less than 50% respiratory variability, suggesting right atrial pressure of 15 mmHg. IAS/Shunts: No atrial level shunt detected by color flow Doppler.  LEFT VENTRICLE PLAX 2D LVIDd:         5.10 cm      Diastology LVIDs:         3.50 cm      LV e' medial:    7.51 cm/s LV PW:         1.40 cm      LV E/e' medial:  15.3 LV IVS:        1.20 cm      LV e' lateral:   11.30 cm/s LVOT diam:     2.40 cm      LV E/e' lateral: 10.2 LV SV:         71 LV SV Index:   30 LVOT Area:  4.52 cm  LV Volumes (MOD) LV vol d, MOD A2C: 109.0 ml LV vol d, MOD A4C: 139.0 ml LV vol s, MOD A2C: 51.2 ml LV vol s, MOD A4C: 63.7 ml LV SV MOD A2C:     57.8 ml LV SV MOD A4C:     139.0 ml LV SV MOD BP:      66.1 ml RIGHT VENTRICLE             IVC RV S prime:     18.50 cm/s  IVC diam: 3.10 cm TAPSE (M-mode): 1.9 cm LEFT ATRIUM             Index        RIGHT ATRIUM           Index LA diam:        5.30 cm 2.26 cm/m   RA Area:     26.43 cm LA Vol (A2C):   75.6 ml 32.19 ml/m  RA Volume:   86.37  ml  36.77 ml/m LA Vol (A4C):   89.1 ml 37.94 ml/m LA Biplane Vol: 84.8 ml 36.11 ml/m  AORTIC VALVE AV Area (Vmax):    2.26 cm AV Area (Vmean):   2.34 cm AV Area (VTI):     2.28 cm AV Vmax:           178.00 cm/s AV Vmean:          119.000 cm/s AV VTI:            0.311 m AV Peak Grad:      12.7 mmHg AV Mean Grad:      7.0 mmHg LVOT Vmax:         89.10 cm/s LVOT Vmean:        61.600 cm/s LVOT VTI:          0.157 m LVOT/AV VTI ratio: 0.50  AORTA Ao Root diam: 3.60 cm Ao Asc diam:  3.90 cm MITRAL VALVE                TRICUSPID VALVE MV Area (PHT): 6.65 cm     TR Peak grad:   13.8 mmHg MV Decel Time: 114 msec     TR Vmax:        186.00 cm/s MV E velocity: 115.00 cm/s                             SHUNTS                             Systemic VTI:  0.16 m                             Systemic Diam: 2.40 cm Grady Lawman MD Electronically signed by Grady Lawman MD Signature Date/Time: 07/04/2023/3:04:49 PM    Final     Scheduled Meds:  bisacodyl  10 mg Rectal Once   diclofenac  100 mg Rectal Once   insulin  aspart  0-15 Units Subcutaneous TID WC   insulin  glargine-yfgn  10 Units Subcutaneous Daily   metoprolol  tartrate  25 mg Oral BID   potassium chloride   40 mEq Oral Once   sodium chloride  flush  3 mL Intravenous Q12H   Continuous Infusions:  piperacillin-tazobactam (ZOSYN)  IV 3.375 g (07/06/23 8119)     LOS: 3 days   Modena Andes, MD [facility]  07/06/2023, 9:14 AM   *Please note that this is a verbal dictation therefore any spelling or grammatical errors are due to the "[employer]agon [employer] One" system interpretation.  Please page via Amion and do not message via secure chat for urgent patient care matters. Secure chat can be used for non urgent patient care matters.  How to contact the [facility] Attending or Consulting provider 7A - 7P or covering provider during after hours 7P -7A, for this patient?  Check the care team in [facility] and look for a) attending/consulting [facility] provider listed and b)  the [facility] team listed. Page or secure chat 7A-7P. Log into [email or web address] and use [facility]'s universal password to access. If you do not have the password, please contact the hospital operator. Locate the [facility] provider you are looking for under [facility] and page to a number that you can be directly reached. If you still have difficulty reaching the provider, please page the [facility] (Director on Call) for the Hospitalists listed on amion for assistance.

## 2023-07-06 NOTE — Anesthesia Postprocedure Evaluation (Signed)
 Anesthesia Post Note  Patient: Darius [name]  Procedure(s) Performed: ERCP, WITH INTERVENTION IF INDICATED SPHINCTEROTOMY, BILIARY INSERTION, STENT, BILE DUCT BRUSH BIOPSY, BILE DUCT BIOPSY, SKIN, SUBCUTANEOUS TISSUE, OR MUCOUS MEMBRANE     Patient location during evaluation: PACU Anesthesia Type: General Level of consciousness: awake and alert Pain management: pain level controlled Vital Signs Assessment: post-procedure vital signs reviewed and stable Respiratory status: spontaneous breathing, nonlabored ventilation and respiratory function stable Cardiovascular status: blood pressure returned to baseline and stable Postop Assessment: no apparent nausea or vomiting Anesthetic complications: no   No notable events documented.                 Katilin Raynes

## 2023-07-07 ENCOUNTER — Inpatient Hospital Stay (HOSPITAL_COMMUNITY): Admitting: Anesthesiology

## 2023-07-07 ENCOUNTER — Telehealth: Payer: Self-pay

## 2023-07-07 ENCOUNTER — Other Ambulatory Visit

## 2023-07-07 ENCOUNTER — Encounter (HOSPITAL_COMMUNITY)
Admission: EM | Disposition: A | Discharge status: Home / Self Care | Payer: Self-pay | Source: Home / Self Care | Attending: Family Medicine

## 2023-07-07 ENCOUNTER — Other Ambulatory Visit: Payer: Self-pay

## 2023-07-07 ENCOUNTER — Encounter (HOSPITAL_COMMUNITY): Payer: Self-pay | Admitting: Internal Medicine

## 2023-07-07 DIAGNOSIS — I4891 Unspecified atrial fibrillation: Secondary | ICD-10-CM | POA: Diagnosis not present

## 2023-07-07 DIAGNOSIS — E1122 Type 2 diabetes mellitus with diabetic chronic kidney disease: Secondary | ICD-10-CM

## 2023-07-07 DIAGNOSIS — K810 Acute cholecystitis: Secondary | ICD-10-CM

## 2023-07-07 DIAGNOSIS — N1832 Chronic kidney disease, stage 3b: Secondary | ICD-10-CM

## 2023-07-07 HISTORY — PX: CHOLECYSTECTOMY: SHX55

## 2023-07-07 LAB — COMPREHENSIVE METABOLIC PANEL WITH GFR
ALT: 70 U/L — ABNORMAL HIGH (ref 0–44)
AST: 46 U/L — ABNORMAL HIGH (ref 15–41)
Albumin: 2.1 g/dL — ABNORMAL LOW (ref 3.5–5.0)
Alkaline Phosphatase: 182 U/L — ABNORMAL HIGH (ref 38–126)
Anion gap: 9 (ref 5–15)
BUN: 24 mg/dL — ABNORMAL HIGH (ref 8–23)
CO2: 25 mmol/L (ref 22–32)
Calcium: 8.8 mg/dL — ABNORMAL LOW (ref 8.9–10.3)
Chloride: 100 mmol/L (ref 98–111)
Creatinine, Ser: 1.43 mg/dL — ABNORMAL HIGH (ref 0.61–1.24)
GFR, Estimated: 53 mL/min — ABNORMAL LOW (ref >60)
Glucose, Bld: 159 mg/dL — ABNORMAL HIGH (ref 70–99)
Potassium: 4.2 mmol/L (ref 3.5–5.1)
Sodium: 134 mmol/L — ABNORMAL LOW (ref 135–145)
Total Bilirubin: 8.3 mg/dL — ABNORMAL HIGH (ref 0.0–1.2)
Total Protein: 6 g/dL — ABNORMAL LOW (ref 6.5–8.1)

## 2023-07-07 LAB — GLUCOSE, CAPILLARY
Glucose-Capillary: 220 mg/dL — ABNORMAL HIGH (ref 70–99)
Glucose-Capillary: 137 mg/dL — ABNORMAL HIGH (ref 70–99)
Glucose-Capillary: 152 mg/dL — ABNORMAL HIGH (ref 70–99)
Glucose-Capillary: 170 mg/dL — ABNORMAL HIGH (ref 70–99)
Glucose-Capillary: 218 mg/dL — ABNORMAL HIGH (ref 70–99)
Glucose-Capillary: 242 mg/dL — ABNORMAL HIGH (ref 70–99)

## 2023-07-07 LAB — URINE CULTURE: Culture: 10000 — AB

## 2023-07-07 LAB — CBC
HCT: 42.9 % (ref 39.0–52.0)
Hemoglobin: 14.9 g/dL (ref 13.0–17.0)
MCH: 29.3 pg (ref 26.0–34.0)
MCHC: 34.7 g/dL (ref 30.0–36.0)
MCV: 84.4 fL (ref 80.0–100.0)
Platelets: 193 10*3/uL (ref 150–400)
RBC: 5.08 MIL/uL (ref 4.22–5.81)
RDW: 13.5 % (ref 11.5–15.5)
WBC: 9.9 10*3/uL (ref 4.0–10.5)
nRBC: 0 % (ref 0.0–0.2)

## 2023-07-07 LAB — BILIRUBIN, DIRECT: Bilirubin, Direct: 5.2 mg/dL — ABNORMAL HIGH (ref 0.0–0.2)

## 2023-07-07 SURGERY — LAPAROSCOPIC CHOLECYSTECTOMY
Anesthesia: General | Site: Abdomen

## 2023-07-07 MED ORDER — 0.9 % SODIUM CHLORIDE (POUR BTL) OPTIME
TOPICAL | Status: DC | PRN
  Administered 2023-07-07: 1000 mL

## 2023-07-07 MED ORDER — PROPOFOL 10 MG/ML IV BOLUS
INTRAVENOUS | Status: DC | PRN
  Administered 2023-07-07: 50 mg via INTRAVENOUS
  Administered 2023-07-07: 150 mg via INTRAVENOUS

## 2023-07-07 MED ORDER — CHLORHEXIDINE GLUCONATE 0.12 % MT SOLN: 15.0000 mL | Freq: Once | OROMUCOSAL | Status: AC

## 2023-07-07 MED ORDER — ORAL CARE MOUTH RINSE: 15.0000 mL | Freq: Once | OROMUCOSAL | Status: AC

## 2023-07-07 MED ORDER — ROCURONIUM BROMIDE 10 MG/ML (PF) SYRINGE
PREFILLED_SYRINGE | INTRAVENOUS | Status: DC | PRN
  Administered 2023-07-07: 80 mg via INTRAVENOUS
  Administered 2023-07-07: 20 mg via INTRAVENOUS

## 2023-07-07 MED ORDER — INSULIN ASPART 100 UNIT/ML IJ SOLN
3.0000 [IU] | Freq: Once | INTRAMUSCULAR | Status: AC
  Administered 2023-07-08: 3 [IU] via SUBCUTANEOUS

## 2023-07-07 MED ORDER — FENTANYL CITRATE (PF) 250 MCG/5ML IJ SOLN
INTRAMUSCULAR | Status: AC
  Filled 2023-07-07: qty 5

## 2023-07-07 MED ORDER — LACTATED RINGERS IV SOLN: INTRAVENOUS | Status: DC

## 2023-07-07 MED ORDER — SUGAMMADEX SODIUM 200 MG/2ML IV SOLN
INTRAVENOUS | Status: DC | PRN
  Administered 2023-07-07: 200 mg via INTRAVENOUS

## 2023-07-07 MED ORDER — CHLORHEXIDINE GLUCONATE 0.12 % MT SOLN
OROMUCOSAL | Status: AC
  Filled 2023-07-07: qty 15

## 2023-07-07 MED ORDER — FENTANYL CITRATE (PF) 250 MCG/5ML IJ SOLN
INTRAMUSCULAR | Status: DC | PRN
  Administered 2023-07-07 (×2): 100 ug via INTRAVENOUS
  Administered 2023-07-07: 50 ug via INTRAVENOUS

## 2023-07-07 MED ORDER — LIDOCAINE 2% (20 MG/ML) 5 ML SYRINGE
INTRAMUSCULAR | Status: DC | PRN
  Administered 2023-07-07: 60 mg via INTRAVENOUS

## 2023-07-07 MED ORDER — ONDANSETRON HCL 4 MG/2ML IJ SOLN
INTRAMUSCULAR | Status: AC
  Filled 2023-07-07: qty 2

## 2023-07-07 MED ORDER — INSULIN GLARGINE-YFGN 100 UNIT/ML ~~LOC~~ SOLN
15.0000 [IU] | Freq: Every day | SUBCUTANEOUS | Status: DC
Start: 2023-07-07 — End: 2023-07-08
  Administered 2023-07-08: 15 [IU] via SUBCUTANEOUS
  Filled 2023-07-07 (×2): qty 0.15

## 2023-07-07 MED ORDER — ONDANSETRON HCL 4 MG/2ML IJ SOLN
INTRAMUSCULAR | Status: DC | PRN
  Administered 2023-07-07: 4 mg via INTRAVENOUS

## 2023-07-07 MED ORDER — INDOCYANINE GREEN 25 MG IV SOLR
1.2500 mg | Freq: Once | INTRAVENOUS | Status: AC
  Administered 2023-07-07: 1.25 mg via INTRAVENOUS
  Filled 2023-07-07: qty 10

## 2023-07-07 MED ORDER — HEMOSTATIC AGENTS (NO CHARGE) OPTIME
TOPICAL | Status: DC | PRN
  Administered 2023-07-07: 1 via TOPICAL

## 2023-07-07 MED ORDER — FENTANYL CITRATE (PF) 100 MCG/2ML IJ SOLN
INTRAMUSCULAR | Status: AC
Start: 2023-07-07 — End: ?
  Filled 2023-07-07: qty 2

## 2023-07-07 MED ORDER — FENTANYL CITRATE (PF) 100 MCG/2ML IJ SOLN
25.0000 ug | INTRAMUSCULAR | Status: DC | PRN
  Administered 2023-07-07 (×2): 50 ug via INTRAVENOUS

## 2023-07-07 MED ORDER — CHLORHEXIDINE GLUCONATE 0.12 % MT SOLN
OROMUCOSAL | Status: AC
  Administered 2023-07-07: 15 mL via OROMUCOSAL
  Filled 2023-07-07: qty 15

## 2023-07-07 MED ORDER — LABETALOL HCL 5 MG/ML IV SOLN
INTRAVENOUS | Status: DC | PRN
  Administered 2023-07-07: 5 mg via INTRAVENOUS

## 2023-07-07 MED ORDER — BUPIVACAINE-EPINEPHRINE 0.25% -1:200000 IJ SOLN
INTRAMUSCULAR | Status: DC | PRN
  Administered 2023-07-07: 17 mL

## 2023-07-07 MED ORDER — SODIUM CHLORIDE 0.9 % IR SOLN
Status: DC | PRN
  Administered 2023-07-07: 1000 mL

## 2023-07-07 MED ORDER — BUPIVACAINE-EPINEPHRINE (PF) 0.25% -1:200000 IJ SOLN
INTRAMUSCULAR | Status: AC
Start: 2023-07-07 — End: ?
  Filled 2023-07-07: qty 30

## 2023-07-07 MED ORDER — PROPOFOL 10 MG/ML IV BOLUS
INTRAVENOUS | Status: AC
  Filled 2023-07-07: qty 20

## 2023-07-07 SURGICAL SUPPLY — 34 items
BAG COUNTER SPONGE SURGICOUNT (BAG) ×2 IMPLANT
BLADE CLIPPER SURG (BLADE) IMPLANT
CANISTER SUCTION 3000ML PPV (SUCTIONS) ×2 IMPLANT
CHLORAPREP W/TINT 26 (MISCELLANEOUS) ×2 IMPLANT
CLIP APPLIE 5 13 M/L LIGAMAX5 (MISCELLANEOUS) ×2 IMPLANT
CNTNR URN SCR LID CUP LEK RST (MISCELLANEOUS) IMPLANT
COVER SURGICAL LIGHT HANDLE (MISCELLANEOUS) ×2 IMPLANT
DERMABOND ADVANCED .7 DNX12 (GAUZE/BANDAGES/DRESSINGS) ×2 IMPLANT
DISSECTOR BLUNT TIP ENDO 5MM (MISCELLANEOUS) IMPLANT
ELECTRODE REM PT RTRN 9FT ADLT (ELECTROSURGICAL) ×2 IMPLANT
GLOVE BIO SURGEON STRL SZ7.5 (GLOVE) ×2 IMPLANT
GLOVE INDICATOR 8.0 STRL GRN (GLOVE) ×2 IMPLANT
GOWN STRL REUS W/ TWL LRG LVL3 (GOWN DISPOSABLE) ×4 IMPLANT
GOWN STRL REUS W/ TWL XL LVL3 (GOWN DISPOSABLE) ×2 IMPLANT
IRRIGATION SUCT STRKRFLW 2 WTP (MISCELLANEOUS) ×2 IMPLANT
KIT BASIN OR (CUSTOM PROCEDURE TRAY) ×2 IMPLANT
KIT IMAGING PINPOINTPAQ (MISCELLANEOUS) IMPLANT
KIT TURNOVER KIT B (KITS) ×2 IMPLANT
NS IRRIG 1000ML POUR BTL (IV SOLUTION) ×2 IMPLANT
PAD ARMBOARD POSITIONER FOAM (MISCELLANEOUS) ×2 IMPLANT
PENCIL BUTTON HOLSTER BLD 10FT (ELECTRODE) ×2 IMPLANT
POWDER SURGICEL 3.0 GRAM (HEMOSTASIS) IMPLANT
SCISSORS LAP 5X35 DISP (ENDOMECHANICALS) ×2 IMPLANT
SET TUBE SMOKE EVAC HIGH FLOW (TUBING) ×2 IMPLANT
SUT MNCRL AB 4-0 PS2 18 (SUTURE) ×2 IMPLANT
SUT VICRYL 0 UR6 27IN ABS (SUTURE) IMPLANT
SYSTEM BAG RETRIEVAL 10MM (BASKET) IMPLANT
TIP ENDOSCOPIC SURGICEL (TIP) IMPLANT
TOWEL GREEN STERILE FF (TOWEL DISPOSABLE) ×2 IMPLANT
TRAY LAPAROSCOPIC MC (CUSTOM PROCEDURE TRAY) ×2 IMPLANT
TROCAR ADV FIXATION 5X100MM (TROCAR) ×6 IMPLANT
TROCAR BALLN 12MMX100 BLUNT (TROCAR) ×2 IMPLANT
WARMER LAPAROSCOPE (MISCELLANEOUS) ×2 IMPLANT
WATER STERILE IRR 1000ML POUR (IV SOLUTION) ×2 IMPLANT

## 2023-07-07 NOTE — Progress Notes (Signed)
 Subjective No acute events. ERCP successful yesterday and he feels 'great.' Denies any complaints today.   Objective: Vital signs in last 24 hours: Temp:  [97.6 F (36.4 C)-98.9 F (37.2 C)] 97.6 F (36.4 C) (05/27 0746) Pulse Rate:  [83-101] 95 (05/27 0941) Resp:  [14-24] 18 (05/27 0746) BP: (119-136)/(81-96) 136/92 (05/27 0941) SpO2:  [92 %-100 %] 100 % (05/27 0941) Last BM Date : 07/03/23  Intake/Output from previous day: 05/26 0701 - 05/27 0700 In: 1510 [P.O.:360; I.V.:700; IV Piggyback:450] Out: 850 [Urine:850] Intake/Output this shift: Total I/O In: -  Out: 600 [Urine:600]  Gen: NAD, comfortable CV: RRR Pulm: Normal work of breathing Abd: Soft, NT/ND Ext: SCDs in place  Lab Results: CBC  Recent Labs    07/06/23 0655 07/07/23 0658  WBC 13.1* 9.9  HGB 16.0 14.9  HCT 46.1 42.9  PLT 161 193   BMET Recent Labs    07/06/23 0655 07/07/23 0658  NA 131* 134*  K 3.4* 4.2  CL 99 100  CO2 21* 25  GLUCOSE 104* 159*  BUN 15 24*  CREATININE 1.10 1.43*  CALCIUM  8.5* 8.8*   PT/INR No results for input(s): "LABPROT", "INR" in the last 72 hours. ABG No results for input(s): "PHART", "HCO3" in the last 72 hours.  Invalid input(s): "PCO2", "PO2"  Studies/Results:  Anti-infectives: Anti-infectives (From admission, onward)    Start     Dose/Rate Route Frequency Ordered Stop   07/03/23 2300  [MAR Hold]  piperacillin-tazobactam (ZOSYN) IVPB 3.375 g        (MAR Hold since Tue 07/07/2023 at 0931.Hold Reason: Transfer to a Procedural area)   3.375 g 12.5 mL/hr over 240 Minutes Intravenous Every 8 hours 07/03/23 1612     07/03/23 1530  piperacillin-tazobactam (ZOSYN) IVPB 3.375 g        3.375 g 100 mL/hr over 30 Minutes Intravenous  Once 07/03/23 1515 07/03/23 1559        Assessment/Plan: Patient Active Problem List   Diagnosis Date Noted   Gastritis and gastroduodenitis 07/06/2023   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   Abnormal LFTs 07/06/2023    Cholecystitis, acute 07/06/2023   Choledocholithiasis with acute cholecystitis 07/04/2023   Acute cholecystitis 07/03/2023   Hypotension, unspecified 02/17/2023   Stage 3b chronic kidney disease (HCC) 01/14/2023   Elevated PSA 01/14/2023   Class 3 severe obesity due to excess calories in adult 01/14/2023   Bladder cancer (HCC) 02/22/2021   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) 12/13/2020   Atrial fibrillation (HCC) 04/17/2016   Dilated cardiomyopathy (HCC) 04/19/2014   Type 2 diabetes mellitus (HCC) 04/09/2014   Non compliance with medical treatment and diet 04/09/2014   Chronic anticoagulation-Eliquis  started 04/06/14 04/09/2014   Abnormal chest CT-LUL nodule- needs f/u May 2016 04/09/2014   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  - GI ERCP 5/26 successful - Cardiology for clearance for surgery underway -noted that he is a high risk for surgery with an RCRI 3, 15%.  There are however no modifiable risk factors that they feel could be optimized.  -The anatomy and physiology of the hepatobiliary system was discussed with the patient. The  pathophysiology of gallbladder disease was then reviewed as well. -The options for treatment were discussed including ongoing observation which may result in subsequent gallbladder complications (infection, pancreatitis, choledocholithiasis, etc), drainage procedures, and surgery - laparoscopic cholecystectomy -The planned procedure, material risks (including, but not limited to, pain, bleeding, infection, scarring, need for blood  transfusion, damage to surrounding structures- blood vessels/nerves/viscus/organs, damage to bile duct, bile leak, chronic diarrhea, conversion to a 'subtotal' cholecystectomy and general expectations therein, post-cholecystectomy diarrhea, potential need for additional procedures including repeat EGD/ERCP, hernia, worsening of pre-existing medical conditions, pancreatitis, pneumonia, heart attack, stroke, death) benefits and alternatives to surgery were discussed at length. We have noted a good probability that the procedure would help improve their symptoms. The patient's questions were answered to his satisfaction, he voiced understanding and elected to proceed with surgery. Additionally, we discussed typical postoperative expectations and the recovery process.   LOS: 4 days   I spent a total of 50 minutes in both face-to-face and non-face-to-face activities, excluding procedures performed, for this visit on the date of this encounter.  Check [email or web address] for General Surgery coverage night/weekend/holidays  No secure chat available for me given surgeries/clinic/off post call which would lead to a delay in care.    Beatris Lincoln, MD [facility]

## 2023-07-07 NOTE — Progress Notes (Signed)
 Upon assessment Pt eyes were yellow, I looked back at the previous assessments and there was nothing noted referencing this. I made the MD aware.

## 2023-07-07 NOTE — Anesthesia Preprocedure Evaluation (Signed)
 Anesthesia Evaluation  Patient identified by MRN, date of birth, ID band Patient awake    Reviewed: Allergy & Precautions, NPO status , Patient's Chart, lab work & pertinent test results  History of Anesthesia Complications Negative for: history of anesthetic complications  Airway Mallampati: III  TM Distance: <3 FB Neck ROM: Full    Dental  (+) Dental Advisory Given, Poor Dentition, Missing, Chipped   Pulmonary neg shortness of breath, sleep apnea and Continuous Positive Airway Pressure Ventilation , neg COPD, neg recent URI, Current Smoker   breath sounds clear to auscultation       Cardiovascular (-) hypertension(-) angina (-) CAD negative cardio ROS Atrial Fibrillation  Rhythm:Irregular Rate:Tachycardia  1. Left ventricular ejection fraction, by estimation, is 50 to 55%. The  left ventricle has low normal function. The left ventricle has no regional  wall motion abnormalities. There is mild left ventricular hypertrophy.  Left ventricular diastolic  parameters are indeterminate.   2. Right ventricular systolic function is mildly reduced. The right  ventricular size is mildly enlarged. Tricuspid regurgitation signal is  inadequate for assessing PA pressure.   3. Left atrial size was mildly dilated.   4. Right atrial size was mildly dilated.   5. The mitral valve is grossly normal. Trivial mitral valve  regurgitation. No evidence of mitral stenosis.   6. The aortic valve is tricuspid. There is mild calcification of the  aortic valve. Aortic valve regurgitation is not visualized. Aortic valve  sclerosis/calcification is present, without any evidence of aortic  stenosis.   7. The inferior vena cava is dilated in size with <50% respiratory  variability, suggesting right atrial pressure of 15 mmHg.      Neuro/Psych neg Seizures PSYCHIATRIC DISORDERS  Depression       GI/Hepatic negative GI ROS, Neg liver ROS,,,Lab Results       Component                Value               Date                      ALT                      79 (H)              07/06/2023                AST                      33                  07/06/2023                ALKPHOS                  183 (H)             07/06/2023                BILITOT                  10.8 (H)            07/06/2023              Endo/Other  diabetes, Type 2    Renal/GU CRFRenal diseaseLab Results      Component  Value               Date                      NA                       131 (L)             07/06/2023                K                        3.4 (L)             07/06/2023                CO2                      21 (L)              07/06/2023                GLUCOSE                  104 (H)             07/06/2023                BUN                      15                  07/06/2023                CREATININE               1.10                07/06/2023                CALCIUM                   8.5 (L)             07/06/2023                GFR                      89.09               05/15/2014                EGFR                     51 (L)              03/18/2023                GFRNONAA                 >60                 07/06/2023                Musculoskeletal   Abdominal   Peds  Hematology negative hematology ROS (+) Lab Results      Component                Value  Date                      WBC                      13.1 (H)            07/06/2023                HGB                      16.0                07/06/2023                HCT                      46.1                07/06/2023                MCV                      84.9                07/06/2023                PLT                      161                 07/06/2023             Xarelto  held   Anesthesia Other Findings  abnormal lfts possible choledocholithiasis  Reproductive/Obstetrics                             Anesthesia  Physical Anesthesia Plan  ASA: 3  Anesthesia Plan: General   Post-op Pain Management: Tylenol  PO (pre-op)*   Induction: Intravenous  PONV Risk Score and Plan: 2 and Ondansetron  and Dexamethasone   Airway Management Planned: Oral ETT  Additional Equipment: None  Intra-op Plan:   Post-operative Plan: Extubation in OR  Informed Consent: I have reviewed the patients History and Physical, chart, labs and discussed the procedure including the risks, benefits and alternatives for the proposed anesthesia with the patient or authorized representative who has indicated his/her understanding and acceptance.     Dental advisory given  Plan Discussed with: CRNA  Anesthesia Plan Comments:         Anesthesia Quick Evaluation

## 2023-07-07 NOTE — Progress Notes (Signed)
 Pt cardiac monitoring orders expired, sent provider a message asking if we are going to D/C or send in new orders.

## 2023-07-07 NOTE — Discharge Instructions (Signed)
 [facility]  Please arrive at least 30 min before your appointment to complete your check in paperwork.  If you are unable to arrive 30 min prior to your appointment time we may have to cancel or reschedule you. LAPAROSCOPIC SURGERY: POST OP INSTRUCTIONS Always review your discharge instruction sheet given to you by the facility where your surgery was performed. IF YOU HAVE DISABILITY OR FAMILY LEAVE FORMS, YOU MUST BRING THEM TO THE OFFICE FOR PROCESSING.   DO NOT GIVE THEM TO YOUR DOCTOR.  PAIN CONTROL  First take acetaminophen  (Tylenol ) AND/or ibuprofen (Advil) to control your pain after surgery.  Follow directions on package.  Taking acetaminophen  (Tylenol ) and/or ibuprofen (Advil) regularly after surgery will help to control your pain and lower the amount of prescription pain medication you may need.  You should not take more than 4,000 mg (4 grams) of acetaminophen  (Tylenol ) in 24 hours.  You should not take ibuprofen (Advil), aleve , motrin, naprosyn  or other NSAIDS if you have a history of stomach ulcers or chronic kidney disease.  A prescription for pain medication may be given to you upon discharge.  Take your pain medication as prescribed, if you still have uncontrolled pain after taking acetaminophen  (Tylenol ) or ibuprofen (Advil). Use ice packs to help control pain. If you need a refill on your pain medication, please contact your pharmacy.  They will contact our office to request authorization. Prescriptions will not be filled after 5pm or on week-ends.  HOME MEDICATIONS Take your usually prescribed medications unless otherwise directed.  DIET You should follow a light diet the first few days after arrival home.  Be sure to include lots of fluids daily. Avoid fatty, fried foods.   CONSTIPATION It is common to experience some constipation after surgery and if you are taking pain medication.  Increasing fluid intake and taking a stool softener (such as Colace)  will usually help or prevent this problem from occurring.  A mild laxative (Milk of Magnesia or Miralax) should be taken according to package instructions if there are no bowel movements after 48 hours.  WOUND/INCISION CARE Most patients will experience some swelling and bruising in the area of the incisions.  Ice packs will help.  Swelling and bruising can take several days to resolve.  Unless discharge instructions indicate otherwise, follow guidelines below  STERI-STRIPS - you may remove your outer bandages 48 hours after surgery, and you may shower at that time.  You have steri-strips (small skin tapes) in place directly over the incision.  These strips should be left on the skin for 7-10 days.   DERMABOND/SKIN GLUE - you may shower in 24 hours.  The glue will flake off over the next 2-3 weeks. Any sutures or staples will be removed at the office during your follow-up visit.  ACTIVITIES You may resume regular (light) daily activities beginning the next day--such as daily self-care, walking, climbing stairs--gradually increasing activities as tolerated.  You may have sexual intercourse when it is comfortable.  Refrain from any heavy lifting or straining until approved by your doctor. You may drive when you are no longer taking prescription pain medication, you can comfortably wear a seatbelt, and you can safely maneuver your car and apply brakes.  FOLLOW-UP You should see your doctor in the office for a follow-up appointment approximately 2-3 weeks after your surgery.  You should have been given your post-op/follow-up appointment when your surgery was scheduled.  If you did not receive a post-op/follow-up appointment, make sure  that you call for this appointment within a day or two after you arrive home to insure a convenient appointment time.   WHEN TO CALL YOUR DOCTOR: Fever over 101.0 Inability to urinate Continued bleeding from incision. Increased pain, redness, or drainage from the  incision. Increasing abdominal pain  The clinic staff is available to answer your questions during regular business hours.  Please don't hesitate to call and ask to speak to one of the nurses for clinical concerns.  If you have a medical emergency, go to the nearest emergency room or call 911.  A surgeon from [facility] is always on call at the hospital. [address], [place], [place]  [address] ? [address] [address], [place], [place]   [address] [phone] ? [phone] ? FAX [phone] SUNDAYS BREAKFAST TWO LOCATIONS: 8:00am served in [facility] by Awaken PPL Corporation 8:30am SHUTTLE provided from [facility], served at Apache Corporation, [address]. LUNCH TWO LOCATIONS [plus one additional third Sunday only] 10:30am - 12:30pm served at Ecolab, Liberty Global, [place] [address] (1.2 miles from [facility]) 12:30pm served in [place] by Land O'Lakes Team (THIRD Sunday only) 1:30pm served at [facility] by [facility] one location [plus one additional third Sunday only] 5:00pm Every Sunday, served under the bridge at [address] by Lige Reeve Under the 3M Company (.7 miles from [facility]) (THIRD Sunday ONLY) 4:00pm served in the parking garage, across from Nucor Corporation, corner of [place] and [place] by Ryland Group Works Ministries MONDAYS BREAKFAST 7:30am served in Nucor Corporation by the United States Steel Corporation and Friends LUNCH 10:30am - 12:30pm served at [employer]col[employer], Liberty Global, [place] [address] (1.2 miles from [facility], [facility]) DINNER TWO LOCATIONS: 7:00pm served in front of the courthouse at the corner of Goldman Sachs and Entergy Corporation. by [place] Monday Night Meal (3 blocks from [facility]) 4:30pm served at the AutoNation, [address] by Bank of New York Company (0.6 miles from [facility]) TUESDAYS BREAKFAST 8:00am - 9:00am  served at The TJX Companies, [address] (0.3 miles from [place]) LUNCH 10:30am - 12:30pm served at the Ecolab, Liberty Global [address] (1.2 miles from [place]) DINNER 6:00pm served at CSX Corporation, enter from Capital One and go to the Sonic Automotive, (0.7 miles from [place]) [facility] BREAKFAST 7:00am - 8:00am served at [employer], Liberty Global [address], (1.2 miles from [place]) LUNCH ONE LOCATION [plus two additional locations listed below] 10:30am - 12:30pm served at [employer]col[employer], Liberty Global [address], (1.2 miles from [place]) (FIRST Wednesday ONLY) 11:30am served at Dillard's, [place] [address] (6.6 miles from [place]) (SECOND Wednesday ONLY) 11:00am served at [place]. [place]on Rhine of [address], [address] (1.3 miles from [place]) [place] TWO LOCATIONS 6:00pm served at W. R. Berkley, [place] W. Visteon Corporation. (1.3 miles from [facility]) 4:00pm - 6:00pm (hot dogs and chips) served at Levi Strauss of [facility], [address] (1.7 miles from [place]) [place] BREAKFAST NOT AVAILABLE AT THIS TIME LUNCH 10:30am - 12:30pm served at Ecolab, Liberty Global, [place] [address], (1.2 miles from [facility]) DINNER 6:00pm served at Ecolab  Cendant Corporation, enter from Capital One and go to the Sonic Automotive, (0.7 miles from Nucor Corporation) [place] BREAKFAST NOT AVAILABLE AT THIS TIME LUNCH 10:30am - 12:30pm served at Ecol[employer], Liberty Global [address], (1.2 miles from [place]) DINNER TWO LOCATIONS, [plus one additional first Friday only] 6:00pm served under the bridge at [address] by Lige Reeve Under CSX Corporation. (.7 miles from [facility]) 5:00pm - 7:00pm served at Levi Strauss of [facility], [address] (1.7 miles from [place]) (FIRST Friday ONLY) 5:45 pm - SHUTTLE provided from the LIBRARY at 5:45pm. Served at [facility], [address]. SATURDAYS BREAKFAST TWO LOCATIONS [plus one additional last Saturday only] 8:00am served at [facility] by Delphi 8:30am served at Pulte Homes, [address] (2.2 miles from [facility]) (LAST Saturday ONLY) 8:30am served at Beazer Homes, [address] (5 miles from [place]) LUNCH 10:30am - 12:30pm served at Ecolab, Liberty Global [address] (1.2 miles from [facility]) DINNER 6:00pm served under the bridge at [address] by World Fuel Services Corporation (0.7 miles from Nucor Corporation)  DIRECTIONS FROM [place] TO ALL MEAL LOCATIONS The Bridge at [address] (.7 miles from [address]) [address] on [place]. Turn Right onto DIRECTV 433 ft. Continue onto [address] under bridge, about 500 ft. Courthouse (3 blocks from [facility]) [place] on [address]. Turn right on [address]  1 block to PPL Corporation (.5 miles from [place]) [place] on [place]. YRC Worldwide. past Brink's Company to EMCOR. Enter from Capital One and go to the Affiliated Computer Services building W. R. Berkley [address] Visteon Corporation. (1.3 miles from [facility]) [address] on [place]. Turn Right onto W. Melanie Spires. church will be on the Left. The TJX Companies [address] (.3 miles from [facility]) Go .3 miles on W. Friendly Destination is on your right Dillard's at ONEOK (6.6 miles from [address]) [address] on [place] toward W [address] Turn right onto [address] Friend[address] Continue onto Alcoa Inc. Continue onto Toll Brothers. 5. Latina Pol is on  right Sanmina-SCI Futures trader) [address] (.6 miles from [place]) [place] on [place]. [address] Turn Left onto [address] 0.3 miles Destination is on the Left. [place] Black & Decker at American Express (5 miles from Nucor Corporation) 1. Head south on [address]. Turn right onto [address] Friendly Turn slightly left onto Quest Diagnostics Continue onto Quest Diagnostics Turn right at Barnes & Noble Continue to church on right [place] Sounds of [facility] [address] (1.7 miles from [place]) [address] on [place] 1.4 miles [place] becomes [place]. [address] Continue 0.6 miles and church will be on theright. North[place] Guardian Life Insurance at [address] (2.5 miles from Nucor Corporation) [place] provided from Massachusetts Mutual Life [place]] [place] on [place]. Elm toward Estée Lauder right onto Costco Wholesale left onto Emerson Electric Turn left onto Micron Technology [address] (2.2 miles from [address]) [address] on [place] 1.4 miles [place] becomes [place]. Elm  [address] Turn right onto [address] and church will be on the Left. Potter's House/ AT&T [address] (1.2 miles from [facility]) 1.Turn right onto [facility] 2.Turn left onto [place] 3.[place] 4.Destination is on your right [place]. [place] Rh[place] of [address] at ToysRus (1.3 miles from [facility]) [address] on [address] Turn left onto Genuine Parts right onto S. [place] Collie. Continue onto KB Home	[place]. Turn left onto Smurfit-Stone Container. Turn right onto WellPoint.  [facility] assistance programs. If you are behind on your bills and expenses, and need some help to make it through a short term hardship or financial emergency, there are several organizations and charities in the [place] and [place] area that may be able to help. They range from the Pathmark Stores, Liberty Global, Scientist, water quality of [place]  and the local community action agency, the Intel, Avnet. These groups may be able to provide you resources to help pay your utility bills, rent, and they even offer housing assistance.  Crisis assistance program Find help for paying your rent, electric bills, free food, and even funds to pay your mortgage. The Liberty Global [phone]) offers several services to local families, as funding allows. The Emergency Assistance Program (EAP), which they administer, provides household goods, free food, clothing, and financial aid to people in need in the [place]  area. The EAP program does have some qualification, and counselors will interview clients for financial assistance by written referral only. Referrals need to be made by the Department of Social Services or by other EAP approved human services agencies or charities in the area.  Money for resources for emergency assistance are available for security deposits for rent, water , electric, and gas, past due rent, utility bills, past due mortgage payments, food, and clothing. The Liberty Global also operates a Programme researcher, broadcasting/film/video on the site. More Liberty Global.  Open Door Ministries of [employer], which can be reached at [phone], offers emergency assistance programs for those in need of help, such as food, rent assistance, a soup kitchen, shelter, and clothing. They are based in [facility] [place]  but provide a number of services to those that qualify for assistance. Continue with Open Door Ministries programs.  [facility] Department of Social Services may be able to offer temporary financial assistance and cash grants for paying rent and utilities. Help may be provided for local county residents who may be experiencing personal crisis when other resources, including government programs, are not available. Call [phone]  St. Ted Favor  Society, which is based in [place], provides financial assistance of up to $50.00 to help pay for rent, utilities, cooling bills, rent, and prescription medications. The program also provides secondhand furniture to those in need. [phone]  Mattel is a Geneticist, molecular. The organization can offer emergency assistance for paying rent, electric bills, utilities, food, household products and furniture. They offer extensive emergency and transitional housing for families, children and single women, and also run a Boy's and Dole Food. 301 Thrift Shops, CMS Energy Corporation, and other aid offered too. [address] [place], [place]  [address], [phone]  Additional locations of the Pathmark Stores are in [place] and other nearby communities. When you have an emergency, need free food, money for basic needs, or just need assistance around Christmas, then the Salvation  Army may have the resources you need. Or they can refer you to nearby agencies. Learn more.  [place] Low Income Risk manager - This is offered for [facility] families. The federal government created CIT Group Program provides a one-time cash grant payment to help eligible low-income families pay their electric and heating bills. [address], [place], [place]  [address], [phone]  Government and Motorola - The county administers several emergency and self-sufficiency programs. Residents of [place] can get help with energy bills and food, rent, and other expenses. In addition, work with a Sports coach who may be able to help you find a job or improve your employment skills. More [place] public assistance.  [place] Emergency Assistance - A program offers emergency utility and rent funds for greater [place] area residents. The program can also provide counseling and referrals to charities and government programs. Also  provides food and a free meal program that serves lunch Mondays - Saturdays and dinner seven days per week to individuals in the community. [address] [place], [place]  [address], [phone]  Parker [employer]in - Offers affordable apartment and housing communities across [place] and [place]. The low income and seniors can access public housing, rental assistance to qualified applicants, and apply for the section 8 rent subsidy program. Other programs include Chiropractor and Engineer, maintenance. [address], [place], [place]  [address], dial [phone].  Basic needs such as clothing - Low income families can receive free items (school supplies, clothes, holiday assistance, etc.) from clothing closets while more moderate income [address] families can shop at Caremark Rx. Locations across the area help the needy. Get information on [place] Triad free clothing centers.  The [facility] provides transitional housing to veterans and the disabled. Clients will also access other services too, including life skills classes, case management, and assistance in finding permanent housing. [address], [place], [place]  [address], call [phone]  [place] Transitional Housing in [place] is for people who were just evicted or that are formerly homeless. The non-profit will also help then gain self-sufficiency, find a home or apartment to live in, and also provides information on rent assistance when needed. Dial [phone]  [employer] Partnership to End Homelessness is available in [place]. Families that were evicted or that are homeless can gain shelter, food, clothing, furniture, and also emergency financial assistance. Other services include financial skills and life skills coaching, job training, and case management. [address], [place], [place] [address].  Telephone [phone].  The Dynegy, Avnet. runs the Ford Motor Company. This can help people save money on their heating and summer cooling bills, and is free to low income families. Free upgrades can be made to your home. Phone [phone]  Many of the non-profits and programs mentioned above are all inclusive, meaning they can meet many needs of the low income, such as energy bills, food, rent, and more. However there are several organizations that focus just on rent and housing. Read more on rent assistance in [place] region.  Legal assistance for evictions, foreclosure, and more If you need free legal advice on civili issues, such as foreclosures, evictions, Electronics engineer, government programs, domestic issues and more, Armed forces operational officer Aid of [place]  [facility]) is a Associate Professor firm that provides free legal services and counsel to lower income people, seniors, disabled, and others. The goal is to ensure everyone has  access to justice and fair representation.  Call them at [phone], or click here to learn more about [place]  free legal assistance programs.  [place] Avnet and funds for emergency expenses The Pathmark Stores is another organization that can provide people with Deere & Company and funds to pay bills. Their assistance depends on funding, and the demand for help is always very high. They can provide cash to help pay rent, a missed mortgage payment, or gas, electric, and water  bills. But the assistance doesn't stop there. They also have a food pantry on site, which can provide food once every three (3) months to people who need help. The KeyCorp can also offer a Engineering geologist once every three (3) months for a maximum three (3) times. After receiving this voucher over that period of time, applicants can receive this aid one every six (6) months after that. [phone].  Kohl's action agency The  Intel, Avnet. offers job and Dispensing optician. Resources are focused on helping students obtain the skills and experiences that are necessary to compete in today's challenging and tight job market. The non-profit faith-based community action agency offers internship trainings as well as classroom instruction. Economically disadvantaged and challenged individuals and potential employers can use their services. Classes are tailored to meet the needs of people in the [facility] region. [place], [place] [address], [phone]    Foreclosure prevention services Housing Counseling and Education is also offered by [employer]vaco of the [place]. The agency (phone number is below) is a Engineer, structural providing foreclosure advice and counseling. They offer mortgage resolution counseling and also reverse mortgage counseling. Counselors can direct people to both Kimberly-Clark, as well as [place]  foreclosure assistance options.  Warehouse manager has locations in [place] and [place]. They run debt and foreclosure prevention programs for local families. A sampling of the programs offered include both Budget and Housing Counseling. This includes money management, financial advice, budget review and development of a written action plan with a Pensions consultant to help solve specific individual financial problems. In addition, housing and mortgage counselors can also provide pre- and post-purchase homeownership counseling, default resolution counseling (to prevent foreclosure) and reverse mortgage counseling. A Debt Management Program allows people and families with a high level of credit card or medical debt to consolidate and repay consumer debt and loans to creditors and rebuild positive credit ratings and scores. [phone]  Debt assistance  programs Receive free counseling and debt help from [facility] of the [place]. The [facility] based agency can be reached at [phone]. The counselors provide free help, and the services include budget counseling. This will help people manage their expenses and set goals. They also offer a Forensic scientist, which will help individuals consolidate their debts and become debt free. Most of the workshops and services are free.  Community clinics in [place] Five of the leading health and dental centers are listed below. They may be able to provide medication, physicals, dental care, and general family care to residents of all incomes and backgrounds across the region. Some of the programs focus on the low income and underinsured. However if these clinics can't meet your needs, find information and details on more clinics in [place] .  Some of the options include Marriott of [employer]. This center provides free or low cost health care to low-income  adults 64 - 64, who have no health insurance. Among other services offered include a pharmacy and eye clinic. Phone [phone]  [facility], which is located in [place], is a community clinic that provides primary medical and health care to uninsured and underinsured adults and families, as well as the low income, in the greater [place] area on a sliding-fee scale. Call [phone]  [place] [facility] - They run a dental assistance program that is organized by [facility] to provide dental services and aid to Sempra Energy. Services offered by the dental clinic are limited to extractions, pain management, and minor restorative care. [phone]  [place] [facility] has locations in [facility] and [place]. The community clinics provide complete pediatric care including primary health, mental health, social work, neurology, cardiology,  asthma. Dial [phone].  In addition to those [place] and Safeway Inc, find other free community clinics in [place]  and across the county.  Food pantry and assistance Some of the local food pantries and distribution centers to call for free food and groceries include The Hive of [place] (phone [phone]), The [facility] (phone [phone]) and also PPL Corporation. Dial [phone].  Several other food banks in the region provide clothing, free food and meals, access to soup kitchens and other help. Find the addresses and phone numbers of more food pantries in [place]. [email or web address]

## 2023-07-07 NOTE — Transfer of Care (Signed)
 Immediate Anesthesia Transfer of Care Note  Patient: Darius [name]  Procedure(s) Performed: LAPAROSCOPIC CHOLECYSTECTOMY (Abdomen)  Patient Location: PACU  Anesthesia Type:General  Level of Consciousness: drowsy and patient cooperative  Airway & Oxygen Therapy: Patient Spontanous Breathing  Post-op Assessment: Report given to RN and Post -op Vital signs reviewed and stable  Post vital signs: Reviewed and stable  Last Vitals:  Vitals Value Taken Time  BP 124/86 07/07/23 1352  Temp    Pulse 98 07/07/23 1354  Resp 20 07/07/23 1354  SpO2 93 % 07/07/23 1354  Vitals shown include unfiled device data.  Last Pain:  Vitals:   07/07/23 0941  TempSrc:   PainSc: 0-No pain         Complications: No notable events documented.

## 2023-07-07 NOTE — Progress Notes (Signed)
 Pt pulled IV out during the night and didn't notify anyone, IV was replaced.

## 2023-07-07 NOTE — Telephone Encounter (Signed)
 ERCP recall has been entered

## 2023-07-07 NOTE — Telephone Encounter (Signed)
-----   Message from [facility] sent at 07/06/2023 12:06 PM EDT ----- Regarding: Biliary stent follow-up Darius [name], This patient needs an ERCP recall for 3 months. He remains in the hospital while getting his gallbladder out and being followed by inpatient team. Thanks. GM

## 2023-07-07 NOTE — Anesthesia Postprocedure Evaluation (Signed)
 Anesthesia Post Note  Patient: Darius [name]  Procedure(s) Performed: LAPAROSCOPIC CHOLECYSTECTOMY (Abdomen)     Patient location during evaluation: PACU Anesthesia Type: General Level of consciousness: awake and alert Pain management: pain level controlled Vital Signs Assessment: post-procedure vital signs reviewed and stable Respiratory status: spontaneous breathing, nonlabored ventilation and respiratory function stable Cardiovascular status: blood pressure returned to baseline and stable Postop Assessment: no apparent nausea or vomiting Anesthetic complications: no  No notable events documented.  Last Vitals:  Vitals:   07/07/23 1430 07/07/23 1445  BP: (!) 129/92 131/79  Pulse: 78 78  Resp: 16 (!) 23  Temp:  36.4 C  SpO2: 95% 94%    Last Pain:  Vitals:   07/07/23 1430  TempSrc:   PainSc: 9                  Rozell Kettlewell,W. EDMOND

## 2023-07-07 NOTE — Op Note (Signed)
 07/03/2023 - 07/07/2023 1:37 PM  PATIENT: Darius [name]  68 y.o. male  Patient Care Team: Laneta Pintos, MD as PCP - General (Family Medicine) Darlis Eisenmenger, MD as PCP - Cardiology (Cardiology) Lei Pump, MD as PCP - Electrophysiology (Cardiology) Darlis Eisenmenger, MD as Consulting Physician (Cardiology)  PRE-OPERATIVE DIAGNOSIS: Acute cholecystitis, recent choledocholithiasis  POST-OPERATIVE DIAGNOSIS: Acute on chronic cholecystitis with recent choledocholithiasis  PROCEDURE: Laparoscopic cholecystectomy with indocyanine green cholangiography  SURGEON: Beatris Lincoln, MD  ASSISTANT: Oza Blumenthal, MD  ANESTHESIA: General endotracheal  EBL: 50 mL  DRAINS: None  SPECIMEN: Gallbladder  COUNTS: Sponge, needle and instrument counts were reported correct x2 at the conclusion of the operation  DISPOSITION: PACU in satisfactory condition  COMPLICATIONS: none  FINDINGS: Acute on chronic cholecystitis with tense inflamed gallbladder.  Critical view of safety achieved prior to clipping or dividing any structures.  Arista was placed in the gallbladder fossa at conclusion.  DESCRIPTION:   The patient was identified & brought into the operating room. He was then positioned supine on the OR table. SCDs were in place and active during the entire case. He then underwent general endotracheal anesthesia. Pressure points were padded. Hair on the abdomen was clipped by the OR team. The abdomen was prepped and draped in the standard sterile fashion. Antibiotics were administered. A surgical timeout was performed and confirmed our plan.   A periumbilical incision was made. The umbilical stalk was grasped and retracted outwardly. The supraumbilical fascia was identified and incised. The peritoneal cavity was gently entered bluntly. A purse-string 0 Vicryl suture was placed. The Hasson cannula was inserted into the peritoneal cavity and insufflation with CO2 commenced to .  A laparoscope was inserted into the peritoneal cavity and inspection confirmed no evidence of trocar site complications. The patient was then positioned in reverse Trendelenburg with slight left side down. 3 additional 5mm trocars were placed along the right subcostal line - one 5mm port in mid subcostal region, another 5mm port in the right flank near the anterior axillary line, and a third 5mm port in the left subxiphoid region obliquely near the falciform ligament.  The liver and gallbladder were inspected. Gallbladder has omental adhesions to it and is inflamed in appearance.  It is tense.  The gallbladder is aspirated with the Nezhat to aid in manipulation.  Turbid green/brown bile was evacuated.. The gallbladder fundus was grasped and elevated cephalad. An additional grasper was then placed on the infundibulum of the gallbladder and the infundibulum was retracted laterally. Staying high on the gallbladder, the peritoneum on both sides of the gallbladder was opened with hook cautery. Gentle blunt dissection was then employed with a [place]  dissector working down into [employer]. The cystic duct was identified and carefully circumferentially dissected. The cystic artery was also identified and carefully circumferentially dissected. The space between the cystic artery and hepatocystic plate was developed such that a good view of the liver could be seen through a window medial to the cystic artery. The triangle of Calot had been cleared of all fibrofatty tissue. At this point, a critical view of safety was achieved and the only structures visualized was the skeletonized cystic duct laterally, the skeletonized cystic artery and the liver through the window medial to the artery.  A small diminutive posterior cystic artery was noted  Under near-infrared light, and indocyanine green cholangiography demonstrates uptake by the liver and excretion into the biliary system.  There is no opacification of the  gallbladder.  The  tracer seen within the candidate cystic duct is quite faint.  There is no tracer activity seen within our cystic artery at its anterior posterior division.  Faint contrast/ICG is seen through the duodenum consistent with a patent biliary system.  The cystic duct and artery were clipped with 2 clips on the patient side and 1 clip on the specimen side including the posterior division. The cystic duct and artery were then divided. The gallbladder was then freed from its remaining attachments to the liver using electrocautery and placed into an endocatch bag. The RUQ was gently irrigated with sterile saline. Hemostasis was then verified.  Oozing on the surface of the liver was controlled electrocautery.  The clips were in good position; the gallbladder fossa was dry.  Arista was placed in the gallbladder fossa. The rest of the abdomen was inspected no injury nor bleeding elsewhere was identified.  The endocatch bag containing the gallbladder was then removed from the umbilical port site and passed off as specimen. The RUQ ports were removed under direct visualization and noted to be hemostatic. The umbilical fascia was then closed using the 0 Vicryl purse-string suture. The fascia was palpated and noted to be completely closed. The skin of all incision sites was approximated with 4-0 monocryl subcuticular suture and dermabond applied. He was then awakened from anesthesia, extubated, and transferred to a stretcher for transport to PACU in satisfactory condition.

## 2023-07-07 NOTE — Progress Notes (Signed)
 Patient arrived to [phone] with PACU, reports a 4/10 pain located in the abdomen. Lap sites assessed, no areas of concern. Patient is due to void. Bed in lowest position, call light within reach.

## 2023-07-07 NOTE — Progress Notes (Signed)
 Pt was also under the impression he was having a Lap Choley done today 07/07/2023, I clarified with the on call provider because that wasn't passed on during report and I didn't see that information in the chart. Provider confirmed that there was not a surgery scheduled at this time and the Pt would be notified if and when it was determined.

## 2023-07-07 NOTE — Anesthesia Procedure Notes (Signed)
 Procedure Name: Intubation Date/Time: 07/07/2023 11:59 AM  Performed by: Katrinka Parr, CRNAPre-anesthesia Checklist: Patient identified, Emergency Drugs available, Suction available and Patient being monitored Patient Re-evaluated:Patient Re-evaluated prior to induction Oxygen Delivery Method: Circle System Utilized Preoxygenation: Pre-oxygenation with 100% oxygen Induction Type: IV induction Ventilation: Mask ventilation without difficulty Laryngoscope Size: Mac and 4 Grade View: Grade II Tube type: Oral Tube size: 7.5 mm Number of attempts: 1 Airway Equipment and Method: Stylet and Oral airway Placement Confirmation: ETT inserted through vocal cords under direct vision, positive ETCO2 and breath sounds checked- equal and bilateral Secured at: 23 cm Tube secured with: Tape Dental Injury: Teeth and Oropharynx as per pre-operative assessment

## 2023-07-07 NOTE — Care Management Important Message (Signed)
 Important Message  Patient Details  Name: Darius [name] MRN: [id number] Date of Birth: Sep 11, 1955   Important Message Given:  Yes - Medicare IM     Felix Host 07/07/2023, 3:17 PM

## 2023-07-07 NOTE — Plan of Care (Signed)
   Problem: Clinical Measurements: Goal: Ability to maintain clinical measurements within normal limits will improve Outcome: Progressing Goal: Diagnostic test results will improve Outcome: Progressing   Problem: Activity: Goal: Risk for activity intolerance will decrease Outcome: Progressing   Problem: Nutrition: Goal: Adequate nutrition will be maintained Outcome: Progressing

## 2023-07-07 NOTE — Plan of Care (Signed)

## 2023-07-07 NOTE — Progress Notes (Signed)
 PROGRESS NOTE    ANIKETH HUBERTY  BJY:[id number] DOB: 1955-03-25 DOA: 07/03/2023 PCP: Laneta Pintos, MD   Brief Narrative:  Darius [name] is a 68 y.o. male with medical history significant of diabetes, CKD 3B, atrial fibrillation, CHF, OSA, obesity, bladder cancer presented with abdominal pain associated with nausea, vomiting and nonbloody diarrhea for 3 days.[name]   Upon arrival to ED, he was hemodynamically stable, AST 142, ALT 215, alk phos 188, T. bili 8.4.  CBC with leukocytosis to 15.4.  Lipase normal. CT abdomen pelvis showed changes consistent with acute cholecystitis and acute choledocholithiasis.  Patient admitted to hospital service, general surgery and GI consulted.  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  > LFTs elevated with AST 142, ALT 215, alk phos 188, T. bili 8.4. Leukocytosis to 15.4. > General Surgery and gastroenterology consulted.  Seen by GI, MRCP confirmed several stones in CBD.  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  Small choledocholithiasis was found and removed completely, plastic biliary stent was placed into CBD and left hepatic system.  GI recommended cholecystectomy, repeat ERCP in 3 months for stent removal and continuation of the antibiotics and once daily  PPI.  DOAC to be resumed 48 hours after ERCP if cleared by general surgery.  Patient remains on Zosyn , cholecystectomy scheduled today.  Diabetes melitis type II, POA: > 20 units daily, semaglutide  at home along with metformin  but currently on 10 units of Lantus  and SSI blood sugar slightly elevated, will increase Semglee  to 15 units.   CKD 3B ruled out, CKD stage IIIa ruled in: Patient's baseline creatinine appears to be between 1.3-1.5 making it CKD stage IIIa, currently at baseline.  Hypokalemia: Resolved.  Atrial fibrillation - Rate is controlled, not on any rate control medications at home, Xarelto  on hold.   CHF with recovered ejection fraction > Last echo was 2023 with EF 50 of 80%, normal diastolic function, normal RV function. -Not currently taking any medications for this   History of bladder cancer - Noted   Obesity class I: Weight loss and dietary modification counseled.   OSA - Not on CPAP per chart   Hyperlipidemia: Holding statin due to elevated LFTs.  BPH: Continue Flomax .  Mild hyponatremia: Stable.  Constipation: Has not had bowel movements in several days.  Dulcolax suppository ordered per his request.  DVT prophylaxis: SCDs Start: 07/03/23 1559   Code Status: Full Code  Family Communication:  None present at bedside.  Plan of care discussed with patient in length and he/she verbalized understanding and agreed with it.  Status is: Inpatient Remains inpatient appropriate because: Needs ERCP followed by cholecystectomy.   Estimated body mass index is 30.82 kg/m as calculated from the following:   Height as of this encounter: 6\' 2"  (1.88 m).   Weight as of this encounter: 108.9  kg.    Nutritional Assessment: Body mass index is 30.82 kg/m.[name] Seen by dietician.  I agree with the assessment and plan as outlined below: Nutrition Status:        . Skin Assessment: I have examined the patient's skin and I agree with the wound assessment as performed by  the wound care RN as outlined below:    Consultants:  Surgery and GI  Procedures:  As above  Antimicrobials:  Anti-infectives (From admission, onward)    Start     Dose/Rate Route Frequency Ordered Stop   07/03/23 2300  piperacillin-tazobactam (ZOSYN) IVPB 3.375 g        3.375 g 12.5 mL/hr over 240 Minutes Intravenous Every 8 hours 07/03/23 1612     07/03/23 1530  piperacillin-tazobactam (ZOSYN) IVPB 3.375 g        3.375 g 100 mL/hr over 30 Minutes Intravenous  Once 07/03/23 1515 07/03/23 1559         Subjective: Patient seen and examined right before he was going down for cholecystectomy.  He was very happy and stated that his pain has significantly improved.  No complaints.  Objective: Vitals:   07/06/23 1750 07/06/23 2103 07/07/23 0500 07/07/23 0746  BP: (!) 133/96 124/89 136/85 119/85  Pulse: 99 86 90 83  Resp: 17 18 18 18   Temp:  98.9 F (37.2 C) 97.6 F (36.4 C) 97.6 F (36.4 C)  TempSrc:  Oral Oral Oral  SpO2: 95% 95%  97%  Weight:      Height:        Intake/Output Summary (Last 24 hours) at 07/07/2023 0751 Last data filed at 07/07/2023 0553 Gross per 24 hour  Intake 1510 ml  Output 850 ml  Net 660 ml   Filed Weights   07/03/23 1117 07/06/23 1004  Weight: 108.9 kg 108.9 kg    Examination:  General exam: Appears calm and comfortable, obese Respiratory system: Clear to auscultation. Respiratory effort normal. Cardiovascular system: S1 & S2 heard, RRR. No JVD, murmurs, rubs, gallops or clicks. No pedal edema. Gastrointestinal system: Abdomen is nondistended, soft and very mild right upper quadrant tenderness. No organomegaly or masses felt. Normal bowel sounds heard. Central nervous system: Alert and oriented. No focal neurological deficits. Extremities: Symmetric 5 x 5 power. Skin: No rashes, lesions or ulcers.    Data Reviewed: I have personally reviewed following labs and imaging studies  CBC: Recent Labs  Lab 07/03/23 1119 07/04/23 0304  07/05/23 0416 07/06/23 0655 07/07/23 0658  WBC 15.4* 12.4* 13.1* 13.1* 9.9  NEUTROABS  --   --  11.2* 8.5*  --   HGB 15.9 15.2 15.4 16.0 14.9  HCT 46.5 44.7 44.9 46.1 42.9  MCV 85.5 85.8 85.0 84.9 84.4  PLT 179 167 163 161 193   Basic Metabolic Panel: Recent Labs  Lab 07/03/23 1119 07/04/23 0304 07/05/23 0416 07/06/23 0655  NA 133* 135 134* 131*  K 3.3* 4.1 4.0 3.4*  CL 100 100 99 99  CO2 21* 25 26 21*  GLUCOSE 185* 97 121* 104*  BUN 14 12 13 15   CREATININE 1.36* 1.35* 1.26* 1.10  CALCIUM  9.2 9.0 8.5* 8.5*   GFR: Estimated Creatinine Clearance: 83.3 mL/min (by C-G formula based on SCr of 1.1 mg/dL). Liver Function Tests: Recent Labs  Lab 07/03/23 1119 07/04/23 0304 07/05/23 0416 07/06/23 0655  AST 142* 80* 44* 33  ALT 215* 165* 112* 79*  ALKPHOS 188* 170* 181* 183*  BILITOT 8.4* 6.6* 9.0* 10.8*  PROT 7.2 6.3*  6.3* 6.1*  ALBUMIN 3.5 2.9* 2.6* 2.2*   Recent Labs  Lab 07/03/23 1119  LIPASE 34   No results for input(s): "AMMONIA" in the last 168 hours. Coagulation Profile: Recent Labs  Lab 07/04/23 0304  INR 1.2   Cardiac Enzymes: No results for input(s): "CKTOTAL", "CKMB", "CKMBINDEX", "TROPONINI" in the last 168 hours. BNP (last 3 results) No results for input(s): "PROBNP" in the last 8760 hours. HbA1C: No results for input(s): "HGBA1C" in the last 72 hours. CBG: Recent Labs  Lab 07/06/23 1148 07/06/23 1214 07/06/23 1746 07/06/23 2104 07/07/23 0743  GLUCAP 126* 134* 210* 212* 220*   Lipid Profile: No results for input(s): "CHOL", "HDL", "LDLCALC", "TRIG", "CHOLHDL", "LDLDIRECT" in the last 72 hours. Thyroid  Function Tests: No results for input(s): "TSH", "T4TOTAL", "FREET4", "T3FREE", "THYROIDAB" in the last 72 hours. Anemia Panel: No results for input(s): "VITAMINB12", "FOLATE", "FERRITIN", "TIBC", "IRON", "RETICCTPCT" in the last 72 hours. Sepsis Labs: No results for input(s): "PROCALCITON", "LATICACIDVEN" in the last 168 hours.  Recent  Results (from the past 240 hours)  Urine Culture     Status: Abnormal   Collection Time: 07/03/23 12:54 PM   Specimen: Urine, Clean Catch  Result Value Ref Range Status   Specimen Description URINE, CLEAN CATCH  Final   Special Requests   Final    NONE Performed at [facility], [address], [place], [place] [address]    Culture (A)  Final    10,000 COLONIES/mL STAPHYLOCOCCUS EPIDERMIDIS 10,000 COLONIES/mL STAPHYLOCOCCUS SAPROPHYTICUS    Report Status 07/07/2023 FINAL  Final   Organism ID, Bacteria STAPHYLOCOCCUS EPIDERMIDIS (A)  Final   Organism ID, Bacteria STAPHYLOCOCCUS SAPROPHYTICUS (A)  Final      Susceptibility   Staphylococcus saprophyticus - MIC*    CIPROFLOXACIN  <=0.5 SENSITIVE Sensitive     GENTAMICIN <=0.5 SENSITIVE Sensitive     NITROFURANTOIN 32 SENSITIVE Sensitive     OXACILLIN SENSITIVE Sensitive     TETRACYCLINE <=1 SENSITIVE Sensitive     VANCOMYCIN  1 SENSITIVE Sensitive     TRIMETH/SULFA <=10 SENSITIVE Sensitive     RIFAMPIN <=0.5 SENSITIVE Sensitive     Inducible Clindamycin  NEGATIVE Sensitive     * 10,000 COLONIES/mL STAPHYLOCOCCUS SAPROPHYTICUS   Staphylococcus epidermidis - MIC*    CIPROFLOXACIN  <=0.5 SENSITIVE Sensitive     GENTAMICIN <=0.5 SENSITIVE Sensitive     NITROFURANTOIN <=16 SENSITIVE Sensitive     OXACILLIN <=0.25 SENSITIVE Sensitive     TETRACYCLINE <=1 SENSITIVE Sensitive     VANCOMYCIN  <=0.5 SENSITIVE Sensitive     TRIMETH/SULFA <=10 SENSITIVE Sensitive     RIFAMPIN <=0.5 SENSITIVE Sensitive     Inducible Clindamycin  NEGATIVE Sensitive     * 10,000 COLONIES/mL STAPHYLOCOCCUS EPIDERMIDIS     Radiology Studies: No results found.   Scheduled Meds:  diclofenac  100 mg Rectal Once   insulin  aspart  0-15 Units Subcutaneous TID WC   insulin  glargine-yfgn  10 Units Subcutaneous Daily   metoprolol  tartrate  25 mg Oral BID   pantoprazole   40 mg Oral Daily   sodium chloride  flush  3 mL Intravenous Q12H   Continuous Infusions:   piperacillin-tazobactam (ZOSYN)  IV 3.375 g (07/07/23 0625)     LOS: 4 days   Modena Andes, MD [facility]  07/07/2023, 7:51 AM   *Please note that this is a verbal dictation therefore any spelling or grammatical errors are due to the "Dragon Medical One" system interpretation.  Please page via Amion and do not message via secure  chat for urgent patient care matters. Secure chat can be used for non urgent patient care matters.  How to contact the [facility] Attending or Consulting provider 7A - 7P or covering provider during after hours 7P -7A, for this patient?  Check the care team in [facility] and look for a) attending/consulting [facility] provider listed and b) the [facility] team listed. Page or secure chat 7A-7P. Log into [email or web address] and use 's universal password to access. If you do not have the password, please contact the hospital operator. Locate the [facility] provider you are looking for under [facility] and page to a number that you can be directly reached. If you still have difficulty reaching the provider, please page the [facility] (Director on Call) for the Hospitalists listed on amion for assistance.

## 2023-07-08 ENCOUNTER — Other Ambulatory Visit (HOSPITAL_COMMUNITY): Payer: Self-pay

## 2023-07-08 ENCOUNTER — Other Ambulatory Visit: Payer: Self-pay

## 2023-07-08 ENCOUNTER — Encounter (HOSPITAL_COMMUNITY): Payer: Self-pay | Admitting: Surgery

## 2023-07-08 ENCOUNTER — Ambulatory Visit: Payer: Self-pay | Admitting: Gastroenterology

## 2023-07-08 DIAGNOSIS — K810 Acute cholecystitis: Secondary | ICD-10-CM | POA: Diagnosis not present

## 2023-07-08 DIAGNOSIS — R7989 Other specified abnormal findings of blood chemistry: Secondary | ICD-10-CM

## 2023-07-08 LAB — CBC WITH DIFFERENTIAL/PLATELET
Abs Immature Granulocytes: 0.06 10*3/uL (ref 0.00–0.07)
Basophils Absolute: 0 10*3/uL (ref 0.0–0.1)
Basophils Relative: 0 %
Eosinophils Absolute: 0.4 10*3/uL (ref 0.0–0.5)
Eosinophils Relative: 4 %
HCT: 42.4 % (ref 39.0–52.0)
Hemoglobin: 14.7 g/dL (ref 13.0–17.0)
Immature Granulocytes: 1 %
Lymphocytes Relative: 8 %
Lymphs Abs: 0.8 10*3/uL (ref 0.7–4.0)
MCH: 29.5 pg (ref 26.0–34.0)
MCHC: 34.7 g/dL (ref 30.0–36.0)
MCV: 85 fL (ref 80.0–100.0)
Monocytes Absolute: 0.8 10*3/uL (ref 0.1–1.0)
Monocytes Relative: 8 %
Neutro Abs: 7.6 10*3/uL (ref 1.7–7.7)
Neutrophils Relative %: 79 %
Platelets: 207 10*3/uL (ref 150–400)
RBC: 4.99 MIL/uL (ref 4.22–5.81)
RDW: 13.8 % (ref 11.5–15.5)
WBC: 9.6 10*3/uL (ref 4.0–10.5)
nRBC: 0 % (ref 0.0–0.2)

## 2023-07-08 LAB — COMPREHENSIVE METABOLIC PANEL WITH GFR
ALT: 94 U/L — ABNORMAL HIGH (ref 0–44)
AST: 77 U/L — ABNORMAL HIGH (ref 15–41)
Albumin: 2.1 g/dL — ABNORMAL LOW (ref 3.5–5.0)
Alkaline Phosphatase: 191 U/L — ABNORMAL HIGH (ref 38–126)
Anion gap: 7 (ref 5–15)
BUN: 22 mg/dL (ref 8–23)
CO2: 26 mmol/L (ref 22–32)
Calcium: 8.2 mg/dL — ABNORMAL LOW (ref 8.9–10.3)
Chloride: 103 mmol/L (ref 98–111)
Creatinine, Ser: 1.47 mg/dL — ABNORMAL HIGH (ref 0.61–1.24)
GFR, Estimated: 51 mL/min — ABNORMAL LOW (ref >60)
Glucose, Bld: 79 mg/dL (ref 70–99)
Potassium: 3.7 mmol/L (ref 3.5–5.1)
Sodium: 136 mmol/L (ref 135–145)
Total Bilirubin: 7.8 mg/dL — ABNORMAL HIGH (ref 0.0–1.2)
Total Protein: 5.8 g/dL — ABNORMAL LOW (ref 6.5–8.1)

## 2023-07-08 LAB — SURGICAL PATHOLOGY

## 2023-07-08 LAB — GLUCOSE, CAPILLARY
Glucose-Capillary: 105 mg/dL — ABNORMAL HIGH (ref 70–99)
Glucose-Capillary: 206 mg/dL — ABNORMAL HIGH (ref 70–99)

## 2023-07-08 LAB — CYTOLOGY - NON PAP

## 2023-07-08 MED ORDER — METOPROLOL TARTRATE 25 MG PO TABS
25.0000 mg | ORAL_TABLET | Freq: Two times a day (BID) | ORAL | 0 refills | Status: DC
  Filled 2023-07-08 – 2023-08-26 (×2): qty 60, 30d supply, fill #0

## 2023-07-08 MED ORDER — ATORVASTATIN CALCIUM 80 MG PO TABS
80.0000 mg | ORAL_TABLET | Freq: Every morning | ORAL | 0 refills | Status: DC
  Filled 2023-07-08 – 2023-08-26 (×2): qty 30, 30d supply, fill #0

## 2023-07-08 MED ORDER — EZETIMIBE 10 MG PO TABS
10.0000 mg | ORAL_TABLET | Freq: Every morning | ORAL | 0 refills | Status: DC
  Filled 2023-07-08 – 2023-08-26 (×2): qty 30, 30d supply, fill #0

## 2023-07-08 MED ORDER — RIVAROXABAN 20 MG PO TABS
20.0000 mg | ORAL_TABLET | Freq: Every morning | ORAL | 0 refills | Status: DC
  Filled 2023-07-08 – 2023-08-26 (×2): qty 30, 30d supply, fill #0

## 2023-07-08 MED ORDER — BISACODYL 10 MG RE SUPP
10.0000 mg | Freq: Once | RECTAL | Status: AC
  Administered 2023-07-08: 10 mg via RECTAL
  Filled 2023-07-08: qty 1

## 2023-07-08 MED ORDER — TRAMADOL HCL 50 MG PO TABS
50.0000 mg | ORAL_TABLET | Freq: Four times a day (QID) | ORAL | 0 refills | Status: AC | PRN
  Filled 2023-07-08: qty 10, 3d supply, fill #0

## 2023-07-08 MED ORDER — TAMSULOSIN HCL 0.4 MG PO CAPS
0.4000 mg | ORAL_CAPSULE | Freq: Every day | ORAL | 0 refills | Status: DC
  Filled 2023-07-08: qty 90, 90d supply, fill #0
  Filled 2023-08-26: qty 30, 30d supply, fill #0
  Filled 2023-09-22: qty 30, 30d supply, fill #1
  Filled 2023-10-22: qty 30, 30d supply, fill #2

## 2023-07-08 MED ORDER — ACETAMINOPHEN 500 MG PO TABS: 1000.0000 mg | ORAL_TABLET | Freq: Four times a day (QID) | ORAL | Status: AC | PRN

## 2023-07-08 MED ORDER — PANTOPRAZOLE SODIUM 40 MG PO TBEC
40.0000 mg | DELAYED_RELEASE_TABLET | Freq: Every day | ORAL | 0 refills | Status: DC
Start: 2023-07-08 — End: 2023-09-18
  Filled 2023-07-08 – 2023-08-26 (×2): qty 30, 30d supply, fill #0

## 2023-07-08 MED ORDER — METFORMIN HCL 1000 MG PO TABS
1000.0000 mg | ORAL_TABLET | Freq: Two times a day (BID) | ORAL | 0 refills | Status: DC
  Filled 2023-07-08 – 2023-08-26 (×2): qty 60, 30d supply, fill #0

## 2023-07-08 NOTE — Progress Notes (Signed)
 Mobility Specialist Progress Note:    07/08/23 1100  Mobility  Activity Ambulated with assistance in hallway  Level of Assistance Standby assist, set-up cues, supervision of patient - no hands on  Assistive Device Other (Comment) (IV pole)  Distance Ambulated (ft) 1100 ft  Activity Response Tolerated well  Mobility Referral Yes  Mobility visit 1 Mobility  Mobility Specialist Start Time (ACUTE ONLY) 1050  Mobility Specialist Stop Time (ACUTE ONLY) 1102  Mobility Specialist Time Calculation (min) (ACUTE ONLY) 12 min   Pt received in bed agreeable to mobility. No physical assistance required. C/o abdominal pain throughout, otherwise no c/o. Returned to room w/o fault. Left in chair w/ call bell and personal belongings in reach. All needs met. RN aware.  Inetta Manes Mobility Specialist  Please contact vis Secure Chat or  Rehab Office [phone]

## 2023-07-08 NOTE — Progress Notes (Signed)
   07/08/23 1021  SDOH Interventions  Food Insecurity Interventions Inpatient TOC   Resources placed on AVS

## 2023-07-08 NOTE — Progress Notes (Signed)
   07/08/23 1020  TOC Brief Assessment  Insurance and Status Reviewed  Patient has primary care physician Yes  Prior level of function: independent  Prior/Current Home Services No current home services  Social Drivers of Health Review SDOH reviewed no interventions necessary  Readmission risk has been reviewed Yes  Transition of care needs no transition of care needs at this time      Transition of Care Department [facility]) has reviewed patient and no TOC needs have been identified at this time. We will continue to monitor patient advancement through interdisciplinary progression rounds. If new patient transition needs arise, please place a TOC consult.

## 2023-07-08 NOTE — Progress Notes (Signed)
 Interval CV note:  Patient under went cholecystectomy yesterday with Dr. Camilo Cella.  VS reviewed and stable today, rates well controlled.  Pt has heart failure clinic follow up 07/14/23 where medical therapy can again be reviewed.  Nonadherence to medical therapy ongoing issue, however he seems to be tolerating metoprolol  tartrate 25 mg BID reasonably well and this can be continued at discharge for PVCs and afib rate control.   Cardiology will sign off, however please contact our service if questions arise prior to dismissal.   Caylan Chenard A Xavious Sharrar, MD

## 2023-07-08 NOTE — Progress Notes (Signed)
 Operative notes reviewed -  Lap cholecystectomy with cholangiography with findings of acute on chronic cholecystitis and patent biliary tree.    Our office will arrange for repeat ERCP with stent removal in 3 months . We will be in touch with patient with date and time.   Recommend he have LFTs at our office in one week.  Patient should have repeat LFTs one week at [facility] office at [address] [place], [place] [address] in one week between the hours of 8:00 am and 4:00 pm to have labs drawn. The labs is located in the basement of the building.

## 2023-07-08 NOTE — Progress Notes (Signed)
 AVS completed for discharge packet and placed with chart.

## 2023-07-08 NOTE — Progress Notes (Signed)
 Explained discharge instructions to patient. Reviewed follow up appointment and next medication administration times. Also reviewed education. Patient verbalized having an understanding for instructions given. All belongings are in the patient's possession. IVs and telemetry were removed. CCMD was notified. No other needs verbalized. Transporting downstairs for discharge.

## 2023-07-08 NOTE — Plan of Care (Signed)
  Problem: Education: Goal: Knowledge of General Education information will improve Description: Including pain rating scale, medication(s)/side effects and non-pharmacologic comfort measures Outcome: Progressing   Problem: Health Behavior/Discharge Planning: Goal: Ability to manage health-related needs will improve Outcome: Progressing   Problem: Activity: Goal: Risk for activity intolerance will decrease Outcome: Progressing   Problem: Nutrition: Goal: Adequate nutrition will be maintained Outcome: Progressing   Problem: Elimination: Goal: Will not experience complications related to bowel motility Outcome: Progressing   Problem: Safety: Goal: Ability to remain free from injury will improve Outcome: Progressing

## 2023-07-08 NOTE — Plan of Care (Signed)

## 2023-07-08 NOTE — Discharge Summary (Signed)
 Physician Discharge Summary  Darius [name] ZOX:[id number] DOB: 01-28-1956 DOA: 07/03/2023  PCP: Laneta Pintos, MD  Admit date: 07/03/2023 Discharge date: 07/08/2023 30 Day Unplanned Readmission Risk Score    Flowsheet Row ED to Hosp-Admission (Current) from 07/03/2023 in [facility]  SURGICAL  30 Day Unplanned Readmission Risk Score (%) 9.82 Filed at 07/08/2023 0801       This score is the patient's risk of an unplanned readmission within 30 days of being discharged (0 -100%). The score is based on dignosis, age, lab data, medications, orders, and past utilization.   Low:  0-14.9   Medium: 15-21.9   High: 22-29.9   Extreme: 30 and above          Admitted From: Home Disposition: Home  Recommendations for Outpatient Follow-up:  Follow up with PCP in 1-2 weeks Please obtain CMP/CBC in one week Follow-up with GI in 3 months for repeat ERCP and removal of the stent. Please follow up with your PCP on the following pending results: Unresulted Labs (From admission, onward)    None         Home Health: None Equipment/Devices: None  Discharge Condition: Stable CODE STATUS: Full code Diet recommendation: Cardiac and diabetic  Subjective: Seen and examined, he has no complaints.  Plan of discharge discussed with him and he is in agreement.  Brief/Interim Summary: NORMAND DAMRON is a 68 y.o. male with medical history significant of diabetes, CKD 3B, atrial fibrillation, CHF, OSA, obesity, bladder cancer presented with abdominal pain associated with nausea, vomiting and nonbloody diarrhea for 3 days.[name]   Upon arrival to ED, he was hemodynamically stable, AST 142, ALT 215, alk phos 188, T. bili 8.4.  CBC with leukocytosis to 15.4.  Lipase normal. CT abdomen pelvis showed changes consistent with acute cholecystitis and acute choledocholithiasis.  Patient admitted to hospital service, general surgery and GI consulted.   Acute cholecystitis  / Acute  choledocholithiasis/elevated LFTs > CT showing evidence of acute cholecystitis and acute choledocholithiasis.  > LFTs elevated with AST 142, ALT 215, alk phos 188, T. bili 8.4. Leukocytosis to 15.4. > General Surgery and gastroenterology consulted.  Seen by GI, MRCP confirmed several stones in CBD.  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  Small choledocholithiasis was found and removed completely, plastic biliary stent was placed into CBD and left hepatic system.  GI recommended cholecystectomy, repeat ERCP in 3 months for stent removal and continuation of the antibiotics and once daily PPI.  DOAC to be resumed 48 hours after ERCP if cleared by general surgery however general surgery recommends resuming anticoagulation tomorrow on 07/09/2023.  Patient remained on Zosyn, underwent cholecystectomy 07/07/2023.  LFTs fairly stable compared to yesterday, cleared by GI as well as general surgery for discharge.  No further indications of antibiotics.  Pain medications prescribed by general surgery.   Diabetes melitis type II, POA: > 20 units daily, semaglutide  at home along with metformin .  Unsure if he was taking metformin .  I have prescribed him metformin  to our [facility] pharmacy.   CKD 3B ruled out, CKD stage IIIa ruled in: Patient's baseline creatinine appears to be between 1.3-1.5 making it CKD stage IIIa, currently at baseline.   Hypokalemia: Resolved.   Atrial fibrillation - Rate is controlled, not on any rate control medications at home but he was started on metoprolol  here which he tolerated very well, Xarelto  was held  during the whole time due to surgical procedures.  He has been cleared by cardiology for discharge, per their recommendation, I prescribed him metoprolol  and it appears that patient was noncompliant to medications including Xarelto  so I prescribed him that as well.  He needs to resume Xarelto   tomorrow.   CHF with recovered ejection fraction Echo this hospitalization showed ejection fraction 50 to 55% with diastolic parameters indeterminate.  Cardiology will follow-up with him as outpatient.   History of bladder cancer - Noted   Obesity class I: Weight loss and dietary modification counseled.   OSA - Not on CPAP per chart    Hyperlipidemia: Resuming statins.  Prescribed him both medications to [facility] pharmacy.   BPH: Continue Flomax .  Prescribe him this as well.   Mild hyponatremia: Resolved.   Constipation: Has not had bowel movements in several days.  Dulcolax suppository ordered per his request before discharge.  Discharge plan was discussed with patient and/or family member and they verbalized understanding and agreed with it.  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)   Stage 3b chronic kidney disease (HCC)   Choledocholithiasis with acute cholecystitis   Gastritis and gastroduodenitis   Biliary stricture   Abnormal LFTs   Cholecystitis, acute    Discharge Instructions   Allergies as of 07/08/2023   No Known Allergies      Medication List     STOP taking these medications    Accu-Chek Guide w/Device Kit   Blood Pressure Kit Devi   oxybutynin  5 MG tablet Commonly known as: DITROPAN        TAKE these medications    acetaminophen  500 MG tablet Commonly known as: TYLENOL  Take 2 tablets (1,000 mg total) by mouth every 6 (six) hours as needed for mild pain (pain score 1-3) (or Fever >/= 101).   atorvastatin  80 MG tablet Commonly known as: LIPITOR  Take 1 tablet (80 mg total) by mouth every morning.   ezetimibe  10 MG tablet Commonly known as: ZETIA  Take 1 tablet  (10 mg total) by mouth every morning.   Lantus  SoloStar 100 UNIT/ML Solostar Pen Generic drug: insulin  glargine Inject 20 Units into the skin every morning.   metFORMIN  1000 MG tablet Commonly known as: GLUCOPHAGE  Take 1 tablet (1,000 mg total) by mouth 2 (two) times daily with a meal. What changed: when to take this   metoprolol  tartrate 25 MG tablet Commonly known as: LOPRESSOR  Take 1 tablet (25 mg total) by mouth 2 (two) times daily.   pantoprazole  40 MG tablet Commonly known as: PROTONIX  Take 1 tablet (40 mg total) by mouth daily.   rivaroxaban  20 MG Tabs tablet Commonly known as: XARELTO  Take 1 tablet (20 mg total) by mouth every morning. Start taking on: Jul 09, 2023   Semaglutide (0.25 or 0.5MG /DOS) 2 MG/3ML Sopn Inject 0.5 mg into the skin once a week.   tamsulosin  0.4 MG Caps capsule Commonly known as: FLOMAX  Take 1 capsule (0.4 mg total) by mouth daily after supper.   traMADol 50 MG tablet Commonly known as: Ultram Take 1 tablet (50 mg total) by mouth every 6 (six) hours as needed.        Follow-up Information     Maczis, Puja  Gosai, PA-C Follow up in 3 week(s).   Specialty: General Surgery Why: Arrive 30 minutes prior to your appointment time, Please bring your insurance card and photo ID Contact information: [address] [place] [address] [facility] [place] [address] [phone]         Laneta Pintos, MD Follow up in 1 week(s).   Specialty: Family Medicine Contact information: [address] [place] [address] [phone]                No Known Allergies  Consultations: General surgery and GI   Procedures/Studies: DG ERCP Result Date: 07/08/2023 CLINICAL DATA:  Abnormal MRCP with elevated liver enzymes EXAM: ERCP TECHNIQUE: Multiple spot images obtained with the fluoroscopic device and submitted for interpretation post-procedure. FLUOROSCOPY: Radiation Exposure Index (as provided by the fluoroscopic  device): 126.95 mGy Kerma COMPARISON:  None Available. FINDINGS: A total of 10 fluoroscopic images submitted for interpretation from ERCP. Flexible endoscopy device with guidewire in the common bile duct and retrograde filling of the common bile duct with apparent focal narrowing of the common hepatic duct. Balloon sweep of the common bile duct. No extravasation. No filling of the cystic duct. At the end of the procedure a plastic biliary stent remains extending into the left hepatic duct IMPRESSION: ERCP. No extravasation. Plastic biliary stent extending into the left hepatic cyst. These images were submitted for radiologic interpretation only. Please see the procedural report for the amount of contrast and the fluoroscopy time utilized. Electronically Signed   By: Susan Ensign   On: 07/08/2023 08:32   ECHOCARDIOGRAM COMPLETE Result Date: 07/04/2023    ECHOCARDIOGRAM REPORT   Patient Name:   BROOKLYN JEFF Date of Exam: 07/04/2023 Medical Rec #:  [id number]       Height:       74.0 in Accession #:    [id number]      Weight:       240.0 lb Date of Birth:  [birth year]      BSA:          2.349 m Patient Age:    67 years        BP:           146/86 mmHg Patient Gender: M               HR:           91 bpm. Exam Location:  Inpatient Procedure: 2D Echo, Cardiac Doppler and Color Doppler (Both Spectral and Color            Flow Doppler were utilized during procedure). Indications:    Preoperative evaluation  History:        Patient has prior history of Echocardiogram examinations, most                 recent 12/30/2021. CHF and Cardiomyopathy, CKD, stage 3,                 Arrythmias:Atrial Fibrillation, Signs/Symptoms:Hypotension; Risk                 Factors:Diabetes and Sleep Apnea.  Sonographer:    Terrilee Few RCS Referring Phys: [id number] Ethelle Herb A ACHARYA IMPRESSIONS  1. Left ventricular ejection fraction, by estimation, is 50 to 55%. The left ventricle has low normal function. The left ventricle has no  regional wall motion abnormalities. There is mild left ventricular hypertrophy. Left ventricular diastolic parameters are indeterminate.  2. Right ventricular systolic function is mildly reduced.  The right ventricular size is mildly enlarged. Tricuspid regurgitation signal is inadequate for assessing PA pressure.  3. Left atrial size was mildly dilated.  4. Right atrial size was mildly dilated.  5. The mitral valve is grossly normal. Trivial mitral valve regurgitation. No evidence of mitral stenosis.  6. The aortic valve is tricuspid. There is mild calcification of the aortic valve. Aortic valve regurgitation is not visualized. Aortic valve sclerosis/calcification is present, without any evidence of aortic stenosis.  7. The inferior vena cava is dilated in size with <50% respiratory variability, suggesting right atrial pressure of 15 mmHg. FINDINGS  Left Ventricle: Left ventricular ejection fraction, by estimation, is 50 to 55%. The left ventricle has low normal function. The left ventricle has no regional wall motion abnormalities. The left ventricular internal cavity size was normal in size. There is mild left ventricular hypertrophy. Left ventricular diastolic parameters are indeterminate. Right Ventricle: The right ventricular size is mildly enlarged. No increase in right ventricular wall thickness. Right ventricular systolic function is mildly reduced. Tricuspid regurgitation signal is inadequate for assessing PA pressure. The tricuspid regurgitant velocity is 1.86 m/s, and with an assumed right atrial pressure of 15 mmHg, the estimated right ventricular systolic pressure is 28.8 mmHg. Left Atrium: Left atrial size was mildly dilated. Right Atrium: Right atrial size was mildly dilated. Pericardium: There is no evidence of pericardial effusion. Mitral Valve: The mitral valve is grossly normal. Trivial mitral valve regurgitation. No evidence of mitral valve stenosis. Tricuspid Valve: The tricuspid valve is normal  in structure. Tricuspid valve regurgitation is trivial. No evidence of tricuspid stenosis. Aortic Valve: The aortic valve is tricuspid. There is mild calcification of the aortic valve. Aortic valve regurgitation is not visualized. Aortic valve sclerosis/calcification is present, without any evidence of aortic stenosis. Aortic valve mean gradient measures 7.0 mmHg. Aortic valve peak gradient measures 12.7 mmHg. Aortic valve area, by VTI measures 2.28 cm. Pulmonic Valve: The pulmonic valve was normal in structure. Pulmonic valve regurgitation is trivial. No evidence of pulmonic stenosis. Aorta: The aortic root is normal in size and structure. Ascending aorta measurements are within normal limits for age when indexed to body surface area. Venous: The inferior vena cava is dilated in size with less than 50% respiratory variability, suggesting right atrial pressure of 15 mmHg. IAS/Shunts: No atrial level shunt detected by color flow Doppler.  LEFT VENTRICLE PLAX 2D LVIDd:         5.10 cm      Diastology LVIDs:         3.50 cm      LV e' medial:    7.51 cm/s LV PW:         1.40 cm      LV E/e' medial:  15.3 LV IVS:        1.20 cm      LV e' lateral:   11.30 cm/s LVOT diam:     2.40 cm      LV E/e' lateral: 10.2 LV SV:         71 LV SV Index:   30 LVOT Area:     4.52 cm  LV Volumes (MOD) LV vol d, MOD A2C: 109.0 ml LV vol d, MOD A4C: 139.0 ml LV vol s, MOD A2C: 51.2 ml LV vol s, MOD A4C: 63.7 ml LV SV MOD A2C:     57.8 ml LV SV MOD A4C:     139.0 ml LV SV MOD BP:      66.1 ml  RIGHT VENTRICLE             IVC RV S prime:     18.50 cm/s  IVC diam: 3.10 cm TAPSE (M-mode): 1.9 cm LEFT ATRIUM             Index        RIGHT ATRIUM           Index LA diam:        5.30 cm 2.26 cm/m   RA Area:     26.43 cm LA Vol (A2C):   75.6 ml 32.19 ml/m  RA Volume:   86.37 ml  36.77 ml/m LA Vol (A4C):   89.1 ml 37.94 ml/m LA Biplane Vol: 84.8 ml 36.11 ml/m  AORTIC VALVE AV Area (Vmax):    2.26 cm AV Area (Vmean):   2.34 cm AV Area  (VTI):     2.28 cm AV Vmax:           178.00 cm/s AV Vmean:          119.000 cm/s AV VTI:            0.311 m AV Peak Grad:      12.7 mmHg AV Mean Grad:      7.0 mmHg LVOT Vmax:         89.10 cm/s LVOT Vmean:        61.600 cm/s LVOT VTI:          0.157 m LVOT/AV VTI ratio: 0.50  AORTA Ao Root diam: 3.60 cm Ao Asc diam:  3.90 cm MITRAL VALVE                TRICUSPID VALVE MV Area (PHT): 6.65 cm     TR Peak grad:   13.8 mmHg MV Decel Time: 114 msec     TR Vmax:        186.00 cm/s MV E velocity: 115.00 cm/s                             SHUNTS                             Systemic VTI:  0.16 m                             Systemic Diam: 2.40 cm Grady Lawman MD Electronically signed by Grady Lawman MD Signature Date/Time: 07/04/2023/3:04:49 PM    Final    MR ABDOMEN MRCP W WO CONTAST Result Date: 07/04/2023 CLINICAL DATA:  Gallstones. Abdominal pain. History bladder cancer. EXAM: MRI ABDOMEN WITHOUT AND WITH CONTRAST (INCLUDING MRCP) TECHNIQUE: Multiplanar multisequence MR imaging of the abdomen was performed both before and after the administration of intravenous contrast. Heavily T2-weighted images of the biliary and pancreatic ducts were obtained, and three-dimensional MRCP images were rendered by post processing. CONTRAST:  10mL GADAVIST  GADOBUTROL  1 MMOL/ML IV SOLN COMPARISON:  CT AP 07/03/2023 FINDINGS: Lower chest: No acute abnormality. Hepatobiliary: Mild liver steatosis. Cyst within segment 4 B measures 2.4 cm, image 25/19. No enhancing liver lesions. Stones identified within the dependent portion of the gallbladder measuring up to 5.1 mm. Gallbladder wall thickening and edema measures up to 9 mm, image 13/4. The common bile duct measures 8 mm in maximum dimension. Multiple stones within the distal common bile duct are noted (at least 5). The largest of these  measures 5 mm, image 19/4. Pancreas: No mass, inflammatory changes, or other parenchymal abnormality identified. Spleen: Normal in size without  suspicious abnormality. Chronic areas of scarring within the splenic parenchyma identified Adrenals/Urinary Tract: Right adrenal gland is normal. Left adrenal nodule measures 1 cm. Small myelolipoma within the left adrenal gland measures 1.1 cm, image 30/4, image 16/8. No follow-up imaging recommended. No hydronephrosis. Mild left-sided perinephric soft tissue stranding. Upper pole left kidney cysts containing thin, internal area of hairline septation measures 2.8 cm and is compatible with a benign Bosniak class 2 cyst. No follow-up imaging recommended. Stomach/Bowel: The stomach appears normal. No pathologic dilatation of the large or small bowel loops. Vascular/Lymphatic: No pathologically enlarged lymph nodes identified. No abdominal aortic aneurysm demonstrated. Other:  No free fluid or fluid collections. Musculoskeletal: Choose 1 IMPRESSION: 1. Cholelithiasis with gallbladder wall thickening and edema compatible with acute cholecystitis. 2. Multiple stones within the distal common bile duct (at least 5). The largest of these measures 5 mm. 3. Mild liver steatosis. 4. Small left adrenal myelolipoma. No follow-up imaging recommended. 5. Mild left-sided perinephric soft tissue stranding. Correlate for any clinical signs or symptoms of pyelonephritis. Electronically Signed   By: Kimberley Penman M.D.   On: 07/04/2023 05:24   CT ABDOMEN PELVIS W CONTRAST Result Date: 07/03/2023 CLINICAL DATA:  Abdominal pain, acute, nonlocalized. History of urinary bladder cancer. * Tracking Code: BO * EXAM: CT ABDOMEN AND PELVIS WITH CONTRAST TECHNIQUE: Multidetector CT imaging of the abdomen and pelvis was performed using the standard protocol following bolus administration of intravenous contrast. RADIATION DOSE REDUCTION: This exam was performed according to the departmental dose-optimization program which includes automated exposure control, adjustment of the mA and/or kV according to patient size and/or use of iterative  reconstruction technique. CONTRAST:  75mL OMNIPAQUE  IOHEXOL  350 MG/ML SOLN COMPARISON:  CT scan chest, abdomen and pelvis from 06/08/2023. FINDINGS: Lower chest: There are subpleural atelectatic changes in the visualized lung bases. No overt consolidation. No pleural effusion. The heart is normal in size. No pericardial effusion. Hepatobiliary: The liver is normal in size. Non-cirrhotic configuration. No suspicious mass. These is mild diffuse hepatic steatosis. There is a subcapsular simple cyst measuring 2.0 x 2.5 cm in the left hepatic lobe, segment 4A. The gallbladder is distended and exhibit mild diffuse gallbladder wall thickening and mild to moderate pericholecystic fat stranding. There are several, sub 5 mm calcified gallstones. No intrahepatic bile duct dilation. The extrahepatic bile duct is also not dilated however, there are at least 4, 2-5 mm sized calculi in the distal extrahepatic bile duct. Findings favor acute cholecystitis and choledocholithiasis. Pancreas: Unremarkable. No pancreatic ductal dilatation or surrounding inflammatory changes. Spleen: Within normal limits. No focal lesion. 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 Unremarkable right adrenal gland. No suspicious renal mass. There is a 1.9 x 2.7 cm cyst arising from the left kidney upper pole which exhibit thin intervening partially calcified septum. No discrete mural nodule. No nephroureterolithiasis or obstructive uropathy on either side. Focal scarring noted in the right kidney lower pole, laterally. Urinary bladder is under distended, precluding optimal assessment. However, no large mass or stones identified. No perivesical fat stranding. Stomach/Bowel: No disproportionate dilation of the small or large bowel loops. No evidence of abnormal bowel wall thickening or inflammatory changes. The appendix is unremarkable. There is fecalization of several small bowel loops, which is nonspecific but can be  seen with slow transit time/incompetent ileocecal valve. No bowel obstruction. Vascular/Lymphatic: No ascites  or pneumoperitoneum. There is a stable oval 9 x 13 mm right retrocrural hypoattenuating lesion, which is tubular and favored to represent probable thrombosed vascular structure. No abdominal or pelvic lymphadenopathy, by size criteria. No aneurysmal dilation of the major abdominal arteries. There are mild peripheral atherosclerotic vascular calcifications of the aorta and its major branches. Reproductive: Normal size prostate. Symmetric seminal vesicles. Other: There are bilateral small fat containing inguinal hernias. The soft tissues and abdominal wall are otherwise unremarkable. Musculoskeletal: No suspicious osseous lesions. There are mild - moderate multilevel degenerative changes in the visualized spine. IMPRESSION: 1. Findings favor acute cholecystitis and choledocholithiasis. 2. No metastatic bladder carcinoma in the abdomen or pelvis. 3. Multiple other nonacute observations, as described above. Aortic Atherosclerosis (ICD10-I70.0). Electronically Signed   By: Beula Brunswick M.D.   On: 07/03/2023 14:52   CT CHEST ABDOMEN PELVIS WO CONTRAST Result Date: 06/19/2023 EXAMINATION: CT CHEST ABDOMEN PELVIS WO CONTRAST CLINICAL INDICATION: Male, 68 years old. Bladder cancer, invasive, assess treatment response TECHNIQUE: Helical CT scan examination of the chest, abdomen, and pelvis is performed from the domes of the diaphragm to the pubic symphysis. Unless otherwise specified, incidental thyroid , adrenal, renal lesions do not require dedicated imaging follow up. Additionally, any mentioned pulmonary nodules do not require dedicated imaging follow-up based on the Fleischner guidelines unless otherwise specified. Coronary calcifications are not identified unless otherwise specified. COMPARISON: 12/09/2022 FINDINGS: CHEST: The visualized thyroid  is normal. The thoracic aorta is normal in caliber. Scattered  calcified atherosclerotic changes are present. The main pulmonary artery is normal in caliber. The heart is enlarged. There are coronary calcifications. There is no free fluid or pathologic lymphadenopathy by size criteria. The trachea and mainstem bronchi are patent. Subsegmental atelectatic changes are noted within the lung bases. ABDOMEN/PELVIS: The liver contains a cyst. The gallbladder contains calcified stones. The spleen demonstrates lobular contour likely from prior infarcts. The pancreas is normal. Note is made of multiple small stones within the distal common bile duct without biliary obstruction. The adrenals are normal. The kidneys are normal other than a 1 mm nonobstructing right intrarenal stone and left renal cysts. The abdominal aorta is normal in caliber. Scattered calcified atherosclerotic changes are present. The urinary bladder demonstrate wall thickening and surrounding inflammatory changes similar to prior study likely related to posttreatment changes. The prostate is enlarged. Small fat-containing left inguinal hernia noted. Large and small bowel loops are otherwise within normal limits. No free fluid or adenopathy. BONES: There are degenerative changes of the spine and bony pelvis. IMPRESSION: Cholelithiasis with choledocholithiasis without biliary obstruction. ERCP should be considered for further management. Findings of cystitis which may be related to radiation therapy. No obvious bladder mass. No evidence for metastatic disease within the chest, abdomen, or pelvis. DOSE REDUCTION: All CT scans are performed using radiation dose reduction techniques, when applicable. Technical factors are evaluated and adjusted to ensure appropriate moderation of exposure. Electronically signed by: Italy Engel MD 06/19/2023 02:56 PM EDT RP Workstation: [id number]     Discharge Exam: Vitals:   07/08/23 0429 07/08/23 0743  BP: (!) 132/90 129/84  Pulse: 80 100  Resp: 17 17  Temp: 97.9 F (36.6 C)  98.9 F (37.2 C)  SpO2: 95% 95%   Vitals:   07/07/23 1927 07/07/23 2154 07/08/23 0429 07/08/23 0743  BP: (!) 143/93 (!) 143/93 (!) 132/90 129/84  Pulse: 85 85 80 100  Resp: 18  17 17   Temp: (!) 97.5 F (36.4 C)  97.9 F (36.6 C) 98.9 F (37.2 C)  TempSrc: Oral  Oral Oral  SpO2: 100%  95% 95%  Weight:      Height:        General: Pt is alert, awake, not in acute distress Cardiovascular: RRR, S1/S2 +, no rubs, no gallops Respiratory: CTA bilaterally, no wheezing, no rhonchi Abdominal: Soft, NT, ND, bowel sounds + Extremities: no edema, no cyanosis    The results of significant diagnostics from this hospitalization (including imaging, microbiology, ancillary and laboratory) are listed below for reference.     Microbiology: Recent Results (from the past 240 hours)  Urine Culture     Status: Abnormal   Collection Time: 07/03/23 12:54 PM   Specimen: Urine, Clean Catch  Result Value Ref Range Status   Specimen Description URINE, CLEAN CATCH  Final   Special Requests   Final    NONE Performed at [facility], [address] [place], [place] [address]    Culture (A)  Final    10,000 COLONIES/mL STAPHYLOCOCCUS EPIDERMIDIS 10,000 COLONIES/mL STAPHYLOCOCCUS SAPROPHYTICUS    Report Status 07/07/2023 FINAL  Final   Organism ID, Bacteria STAPHYLOCOCCUS EPIDERMIDIS (A)  Final   Organism ID, Bacteria STAPHYLOCOCCUS SAPROPHYTICUS (A)  Final      Susceptibility   Staphylococcus saprophyticus - MIC*    CIPROFLOXACIN  <=0.5 SENSITIVE Sensitive     GENTAMICIN <=0.5 SENSITIVE Sensitive     NITROFURANTOIN 32 SENSITIVE Sensitive     OXACILLIN SENSITIVE Sensitive     TETRACYCLINE <=1 SENSITIVE Sensitive     VANCOMYCIN  1 SENSITIVE Sensitive     TRIMETH/SULFA <=10 SENSITIVE Sensitive     RIFAMPIN <=0.5 SENSITIVE Sensitive     Inducible Clindamycin  NEGATIVE Sensitive     * 10,000 COLONIES/mL STAPHYLOCOCCUS SAPROPHYTICUS   Staphylococcus epidermidis - MIC*    CIPROFLOXACIN  <=0.5  SENSITIVE Sensitive     GENTAMICIN <=0.5 SENSITIVE Sensitive     NITROFURANTOIN <=16 SENSITIVE Sensitive     OXACILLIN <=0.25 SENSITIVE Sensitive     TETRACYCLINE <=1 SENSITIVE Sensitive     VANCOMYCIN  <=0.5 SENSITIVE Sensitive     TRIMETH/SULFA <=10 SENSITIVE Sensitive     RIFAMPIN <=0.5 SENSITIVE Sensitive     Inducible Clindamycin  NEGATIVE Sensitive     * 10,000 COLONIES/mL STAPHYLOCOCCUS EPIDERMIDIS     Labs: BNP (last 3 results) Recent Labs    12/22/22 1108  BNP 53.3   Basic Metabolic Panel: Recent Labs  Lab 07/04/23 0304 07/05/23 0416 07/06/23 0655 07/07/23 0658 07/08/23 0547  NA 135 134* 131* 134* 136  K 4.1 4.0 3.4* 4.2 3.7  CL 100 99 99 100 103  CO2 25 26 21* 25 26  GLUCOSE 97 121* 104* 159* 79  BUN 12 13 15  24* 22  CREATININE 1.35* 1.26* 1.10 1.43* 1.47*  CALCIUM  9.0 8.5* 8.5* 8.8* 8.2*   Liver Function Tests: Recent Labs  Lab 07/04/23 0304 07/05/23 0416 07/06/23 0655 07/07/23 0658 07/08/23 0547  AST 80* 44* 33 46* 77*  ALT 165* 112* 79* 70* 94*  ALKPHOS 170* 181* 183* 182* 191*  BILITOT 6.6* 9.0* 10.8* 8.3* 7.8*  PROT 6.3* 6.3* 6.1* 6.0* 5.8*  ALBUMIN 2.9* 2.6* 2.2* 2.1* 2.1*   Recent Labs  Lab 07/03/23 1119  LIPASE 34   No results for input(s): "AMMONIA" in the last 168 hours. CBC: Recent Labs  Lab 07/04/23 0304 07/05/23 0416 07/06/23 0655 07/07/23 0658 07/08/23 0547  WBC 12.4* 13.1* 13.1* 9.9 9.6  NEUTROABS  --  11.2* 8.5*  --  7.6  HGB 15.2 15.4 16.0 14.9 14.7  HCT 44.7 44.9 46.1 42.9 42.4  MCV 85.8 85.0 84.9 84.4 85.0  PLT 167 163 161 193 207   Cardiac Enzymes: No results for input(s): "CKTOTAL", "CKMB", "CKMBINDEX", "TROPONINI" in the last 168 hours. BNP: Invalid input(s): "POCBNP" CBG: Recent Labs  Lab 07/07/23 1355 07/07/23 1506 07/07/23 1929 07/07/23 2209 07/08/23 0740  GLUCAP 152* 170* 218* 242* 105*   D-Dimer No results for input(s): "DDIMER" in the last 72 hours. Hgb A1c No results for input(s): "HGBA1C" in  the last 72 hours. Lipid Profile No results for input(s): "CHOL", "HDL", "LDLCALC", "TRIG", "CHOLHDL", "LDLDIRECT" in the last 72 hours. Thyroid  function studies No results for input(s): "TSH", "T4TOTAL", "T3FREE", "THYROIDAB" in the last 72 hours.  Invalid input(s): "FREET3" Anemia work up No results for input(s): "VITAMINB12", "FOLATE", "FERRITIN", "TIBC", "IRON", "RETICCTPCT" in the last 72 hours. Urinalysis    Component Value Date/Time   COLORURINE AMBER (A) 07/03/2023 1119   APPEARANCEUR HAZY (A) 07/03/2023 1119   APPEARANCEUR Clear 05/18/2023 1028   LABSPEC 1.023 07/03/2023 1119   PHURINE 5.0 07/03/2023 1119   GLUCOSEU >=500 (A) 07/03/2023 1119   HGBUR NEGATIVE 07/03/2023 1119   BILIRUBINUR MODERATE (A) 07/03/2023 1119   BILIRUBINUR Negative 05/18/2023 1028   KETONESUR 5 (A) 07/03/2023 1119   PROTEINUR 30 (A) 07/03/2023 1119   UROBILINOGEN 0.2 10/20/2019 1137   UROBILINOGEN 0.2 02/13/2017 1540   NITRITE NEGATIVE 07/03/2023 1119   LEUKOCYTESUR SMALL (A) 07/03/2023 1119   Sepsis Labs Recent Labs  Lab 07/05/23 0416 07/06/23 0655 07/07/23 0658 07/08/23 0547  WBC 13.1* 13.1* 9.9 9.6   Microbiology Recent Results (from the past 240 hours)  Urine Culture     Status: Abnormal   Collection Time: 07/03/23 12:54 PM   Specimen: Urine, Clean Catch  Result Value Ref Range Status   Specimen Description URINE, CLEAN CATCH  Final   Special Requests   Final    NONE Performed at [facility], [address], [place], [place] [address]    Culture (A)  Final    10,000 COLONIES/mL STAPHYLOCOCCUS EPIDERMIDIS 10,000 COLONIES/mL STAPHYLOCOCCUS SAPROPHYTICUS    Report Status 07/07/2023 FINAL  Final   Organism ID, Bacteria STAPHYLOCOCCUS EPIDERMIDIS (A)  Final   Organism ID, Bacteria STAPHYLOCOCCUS SAPROPHYTICUS (A)  Final      Susceptibility   Staphylococcus saprophyticus - MIC*    CIPROFLOXACIN  <=0.5 SENSITIVE Sensitive     GENTAMICIN <=0.5 SENSITIVE Sensitive      NITROFURANTOIN 32 SENSITIVE Sensitive     OXACILLIN SENSITIVE Sensitive     TETRACYCLINE <=1 SENSITIVE Sensitive     VANCOMYCIN  1 SENSITIVE Sensitive     TRIMETH/SULFA <=10 SENSITIVE Sensitive     RIFAMPIN <=0.5 SENSITIVE Sensitive     Inducible Clindamycin  NEGATIVE Sensitive     * 10,000 COLONIES/mL STAPHYLOCOCCUS SAPROPHYTICUS   Staphylococcus epidermidis - MIC*    CIPROFLOXACIN  <=0.5 SENSITIVE Sensitive     GENTAMICIN <=0.5 SENSITIVE Sensitive     NITROFURANTOIN <=16 SENSITIVE Sensitive     OXACILLIN <=0.25 SENSITIVE Sensitive     TETRACYCLINE <=1 SENSITIVE Sensitive     VANCOMYCIN  <=0.5 SENSITIVE Sensitive     TRIMETH/SULFA <=10 SENSITIVE Sensitive     RIFAMPIN <=0.5 SENSITIVE Sensitive     Inducible Clindamycin  NEGATIVE Sensitive     * 10,000 COLONIES/mL STAPHYLOCOCCUS EPIDERMIDIS    FURTHER DISCHARGE INSTRUCTIONS:   Get Medicines reviewed and adjusted: Please take all your medications with you for your next visit with your Primary MD   Laboratory/radiological data: Please request  your Primary MD to go over all hospital tests and procedure/radiological results at the follow up, please ask your Primary MD to get all Hospital records sent to his/her office.   In some cases, they will be blood work, cultures and biopsy results pending at the time of your discharge. Please request that your primary care M.D. goes through all the records of your hospital data and follows up on these results.   Also Note the following: If you experience worsening of your admission symptoms, develop shortness of breath, life threatening emergency, suicidal or homicidal thoughts you must seek medical attention immediately by calling 911 or calling your MD immediately  if symptoms less severe.   You must read complete instructions/literature along with all the possible adverse reactions/side effects for all the Medicines you take and that have been prescribed to you. Take any new Medicines after you  have completely understood and accpet all the possible adverse reactions/side effects.    patient was instructed, not to drive, operate heavy machinery, perform activities at heights, swimming or participation in water  activities or provide baby-sitting services while on Pain, Sleep and Anxiety Medications; until their outpatient Physician has advised to do so again. Also recommended to not to take more than prescribed Pain, Sleep and Anxiety Medications.  It is not advisable to combine anxiety, sleep and pain medications without talking with your primary care provider.     Wear Seat belts while driving.   Please note: You were cared for by a hospitalist during your hospital stay. Once you are discharged, your primary care physician will handle any further medical issues. Please note that NO REFILLS for any discharge medications will be authorized once you are discharged, as it is imperative that you return to your primary care physician (or establish a relationship with a primary care physician if you do not have one) for your post hospital discharge needs so that they can reassess your need for medications and monitor your lab values  Time coordinating discharge: Over 30 minutes  SIGNED:   Modena Andes, MD  [facility] 07/08/2023, 9:41 AM *Please note that this is a verbal dictation therefore any spelling or grammatical errors are due to the "Dragon Medical One" system interpretation. If 7PM-7AM, please contact night-coverage [email or web address]

## 2023-07-08 NOTE — Progress Notes (Signed)
 1 Day Post-Op  Subjective: Feels well today.  Minimal pain.  Tolerating liquids well.  No nausea.  Objective: Vital signs in last 24 hours: Temp:  [97.5 F (36.4 C)-98.9 F (37.2 C)] 98.9 F (37.2 C) (05/28 0743) Pulse Rate:  [78-100] 100 (05/28 0743) Resp:  [12-23] 17 (05/28 0743) BP: (107-143)/(71-96) 129/84 (05/28 0743) SpO2:  [93 %-100 %] 95 % (05/28 0743) Last BM Date : 07/03/23  Intake/Output from previous day: 05/27 0701 - 05/28 0700 In: 1600 [I.V.:1600] Out: 1650 [Urine:1600; Blood:50] Intake/Output this shift: No intake/output data recorded.  PE: Abd: soft, obese, +BS, ND, incisions c/d/I with dermabond present  Lab Results:  Recent Labs    07/07/23 0658 07/08/23 0547  WBC 9.9 9.6  HGB 14.9 14.7  HCT 42.9 42.4  PLT 193 207   BMET Recent Labs    07/07/23 0658 07/08/23 0547  NA 134* 136  K 4.2 3.7  CL 100 103  CO2 25 26  GLUCOSE 159* 79  BUN 24* 22  CREATININE 1.43* 1.47*  CALCIUM  8.8* 8.2*   PT/INR No results for input(s): "LABPROT", "INR" in the last 72 hours. CMP     Component Value Date/Time   NA 136 07/08/2023 0547   NA 125 (L) 03/18/2023 1425   K 3.7 07/08/2023 0547   CL 103 07/08/2023 0547   CO2 26 07/08/2023 0547   GLUCOSE 79 07/08/2023 0547   BUN 22 07/08/2023 0547   BUN 15 03/18/2023 1425   CREATININE 1.47 (H) 07/08/2023 0547   CREATININE 1.92 (H) 05/14/2021 0807   CREATININE 1.05 11/12/2016 1051   CALCIUM  8.2 (L) 07/08/2023 0547   PROT 5.8 (L) 07/08/2023 0547   PROT 6.5 02/02/2023 1009   ALBUMIN 2.1 (L) 07/08/2023 0547   ALBUMIN 3.8 (L) 02/02/2023 1009   AST 77 (H) 07/08/2023 0547   AST 12 (L) 05/14/2021 0807   ALT 94 (H) 07/08/2023 0547   ALT 16 05/14/2021 0807   ALKPHOS 191 (H) 07/08/2023 0547   BILITOT 7.8 (H) 07/08/2023 0547   BILITOT 0.8 02/02/2023 1009   BILITOT 0.5 05/14/2021 0807   GFRNONAA 51 (L) 07/08/2023 0547   GFRNONAA 38 (L) 05/14/2021 0807   GFRNONAA 76 11/12/2016 1051   GFRAA 70 10/20/2019 1006    GFRAA 88 11/12/2016 1051   Lipase     Component Value Date/Time   LIPASE 34 07/03/2023 1119       Studies/Results: DG ERCP Result Date: 07/08/2023 CLINICAL DATA:  Abnormal MRCP with elevated liver enzymes EXAM: ERCP TECHNIQUE: Multiple spot images obtained with the fluoroscopic device and submitted for interpretation post-procedure. FLUOROSCOPY: Radiation Exposure Index (as provided by the fluoroscopic device): 126.95 mGy Kerma COMPARISON:  None Available. FINDINGS: A total of 10 fluoroscopic images submitted for interpretation from ERCP. Flexible endoscopy device with guidewire in the common bile duct and retrograde filling of the common bile duct with apparent focal narrowing of the common hepatic duct. Balloon sweep of the common bile duct. No extravasation. No filling of the cystic duct. At the end of the procedure a plastic biliary stent remains extending into the left hepatic duct IMPRESSION: ERCP. No extravasation. Plastic biliary stent extending into the left hepatic cyst. These images were submitted for radiologic interpretation only. Please see the procedural report for the amount of contrast and the fluoroscopy time utilized. Electronically Signed   By: Susan Ensign   On: 07/08/2023 08:32    Anti-infectives: Anti-infectives (From admission, onward)    Start  Dose/Rate Route Frequency Ordered Stop   07/03/23 2300  piperacillin-tazobactam (ZOSYN) IVPB 3.375 g        3.375 g 12.5 mL/hr over 240 Minutes Intravenous Every 8 hours 07/03/23 1612     07/03/23 1530  piperacillin-tazobactam (ZOSYN) IVPB 3.375 g        3.375 g 100 mL/hr over 30 Minutes Intravenous  Once 07/03/23 1515 07/03/23 1559        Assessment/Plan POD 1, s/p lap chole, Dr. Camilo Cella 07/07/23 -s/p ERCP with stent placement and stone extraction -LFTs up, but downtrending since ERCP.  GI follow up per their service.  Agree will need outpatient LFT monitoring -surgically doing well -surgical follow up  arranged. -he is surgically stable to DC when otherwise felt stable medically -no further abx therapy warranted from a surgical standpoint -Rx sent to his pharmacy by myself. -d/w primary service   FEN - carb mod diet VTE - ok to resume Xarelto  tomorrow ID - zosyn, but no further needed from our standpoint   LOS: 5 days    Darius [name] , [facility] 07/08/2023, 9:29 AM Please see Amion for pager number during day hours 7:00am-4:30pm or 7:00am -11:30am on weekends

## 2023-07-10 LAB — SURGICAL PATHOLOGY

## 2023-07-13 ENCOUNTER — Telehealth (HOSPITAL_COMMUNITY): Payer: Self-pay

## 2023-07-13 ENCOUNTER — Ambulatory Visit: Admitting: Urology

## 2023-07-13 NOTE — Progress Notes (Signed)
 Advanced Heart Failure Clinic Note   PCP: Laneta Pintos, MD  HF Cardiologist:  Dr. Mitzie Anda  HPI:  Mr. Capell is a 68 y.o. male with DM, paroxysmal Afib, history of left atrial appendage thrombus (on Eliquis ), OSA, chronic systolic CHF, NICM (EF 15%) felt to be tachycardia mediated.   Admitted 04/17/16-04/24/16 for acute systolic CHF and atrial fibrillation with RVR. Had a R/L heart cath on 04/21/16, no CAD, right heart pressures elevated, low output. Echo showed EF 15%. Plan was to undergo TEE/DC-CV, however he was found to have a left atrial appendage thrombus and had only started Eliquis  5 days prior. He was discharged with plans for TEE/DC-CV in about a month. He was started on Entresto  24/26 mg BID during admission. Discharged home on Lasix  40 mg daily, digoxin  0.125mg , and metoprolol  XL 50 mg BID. Discharge 280 pounds.   Repeat TEE done in 4/18 for possible cardioversion.  EF remained 25-30%.  He still had a small, amorphous LA appendage thrombus (smaller than prior but still present).  He admitted to having missed some Eliquis  doses.    He finally had TEE-guided DCCV in 7/18 with conversion to NSR.  TEE showed EF 40-45%.  After this, he went back into atrial fibrilation.  Given poor compliance with followup and recurrent atrial fibrillation, it was decided to leave him in atrial fibrillation as rate was well-controlled.    Echo in 12/21 showed that EF remains 55% with mild LVH, mildly decreased RV systolic function, IVC normal.   Bladder cancer was diagnosed in 11/22 and patient has undergone treatment.   Echo 11/23 showed EF 50-55%  Last seen 11/24.  Admitted 5/25 with acute cholecystitis and acute choledocholithiasis. S/p ERCP, biliary stent placement and cholecystectomy. Remained in rate controlled AF. Echo showed EF 50-55%. Had been off most meds.  Today he returns for post hospital HF follow up. Overall feeling fine. No SOB with activity. Denies palpitations, abnormal bleeding,  CP, dizziness, edema, or PND/Orthopnea. Appetite ok, abdomen bloated. Not weighing at home. Not taking medications consistently. Not interested in paramedicine.  ECG (personally reviewed): AF 106 bpm  Labs (11/23): K 4.6, creatinine 1.57 Labs (5/25): K 3.7, creatinine 1.47  Cardiac Studies  - Echo 5/25: EF 50-55% - Echo 11/23: EF 50-55% - Echo 12/21: EF remains 55% with mild LVH, mildly decreased RV systolic function, IVC normal.  - Echo 8/19: EF 55-60% - TEE 7/18 EF 40-45% No thrombus. Successful DC-CV - TEE: 4/18 with EF 25-30%, diffuse hypokinesis, RV mildly dilated with moderately decreased systolic function, amorphous thrombus LA appendage (improved compared to 3/18).  - RHC/LHC 3/18: no significant CAD; RA 14, PA 50/28 (36), PCWP 22, CO/CI (Fick) 4.85/1.89, PVR 2.9 WU - TEE: 3/18 with LAA thrombus.  - Echo 3/18 EF 15% Peak PA pressure 38 mm hg   Review of systems complete and found to be negative unless listed in HPI.   SH:  Social History   Socioeconomic History   Marital status: Single    Spouse name: Not on file   Number of children: 0   Years of education: 12   Highest education level: Not on file  Occupational History   Occupation: unemployeed  Tobacco Use   Smoking status: Never   Smokeless tobacco: Never  Vaping Use   Vaping status: Never Used  Substance and Sexual Activity   Alcohol use: Yes    Comment: some   Drug use: Not Currently   Sexual activity: Not on file  Other  Topics Concern   Not on file  Social History Narrative   Admits that he eats poorly and eats a lot at work.      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   Social Drivers of Health   Financial Resource Strain: High Risk (07/30/2020)   Overall Financial Resource Strain (CARDIA)    Difficulty of Paying Living Expenses: Hard  Food Insecurity: Food Insecurity Present (07/03/2023)   Hunger Vital Sign    Worried About Running Out of Food in the Last Year: Sometimes  true    Ran Out of Food in the Last Year: Sometimes true  Transportation Needs: No Transportation Needs (07/03/2023)   PRAPARE - Administrator, Civil Service (Medical): No    Lack of Transportation (Non-Medical): No  Physical Activity: Inactive (07/30/2020)   Exercise Vital Sign    Days of Exercise per Week: 0 days    Minutes of Exercise per Session: 0 min  Stress: Stress Concern Present (07/30/2020)   [employer] of Occupational Health - Occupational Stress Questionnaire    Feeling of Stress : To some extent  Social Connections: Socially Isolated (07/03/2023)   Social Connection and Isolation Panel [NHANES]    Frequency of Communication with Friends and Family: More than three times a week    Frequency of Social Gatherings with Friends and Family: Never    Attends Religious Services: Never    Database administrator or Organizations: No    Attends Banker Meetings: Never    Marital Status: Never married  Intimate Partner Violence: Not At Risk (07/03/2023)   Humiliation, Afraid, Rape, and Kick questionnaire    Fear of Current or Ex-Partner: No    Emotionally Abused: No    Physically Abused: No    Sexually Abused: No   FH:  Family History  Problem Relation Age of Onset   Diabetes Father        died in his 30s   Hypertension Father    Seizures Mother        died in her 3s   Hypertension Sister    Hypertension Paternal Grandfather    Heart attack Neg Hx    Stroke Neg Hx    Past Medical History:  Diagnosis Date   Chronic combined systolic and diastolic heart failure (HCC)    followed by cardiologist;  a. Echo 03/27/11: EF 50-55%, inferior hypokinesis, moderate LAE, mild RVE, normal pulmonary pressures. ;   b.  TEE (04/03/11): EF 40-45%, mild MR moderate LAE, no LAA clot, mild RAE;  c.  Echocardiogram (02/2013): EF 60-65%, Gr 2 DD, mildly dilated Ao root (Ao root dimension 39 mm), MAC, mild LAE, normal RVF   Complication of anesthesia    pt unable to  tolerate versed  and fentanyl  sedation and required general anesthesia with TEE 02/ 2013   Depression    Diabetes mellitus, type 2 (HCC)    followed by pcp  (02-21-2021 pt stated does not check blood sugar )   Hx of cardiovascular stress test    Lexiscan  Myoview (02/2013): No ischemia or scar, EF 51%, low risk;  cardiac cath 04-21-2016  no CAD, NICM   Malignant neoplasm of urinary bladder (HCC) 12/2020   urologist--- dr Derrick Fling;  oncologist-- dr Dirk Fredericks,  high grade muscle invasion   Morbid obesity (HCC)    NICM (nonischemic cardiomyopathy) (HCC) 04/2016   followed by cardiology;   dx 03/ 2018 per TEE ef 15%;  TEE 04/ 2018 ef  25%;  TEE 07/ 2018 ef 40%;  echo 12/ 2021 ef 55%   OSA (obstructive sleep apnea)    followed by dr t. turner--  no cpap/ bipap:: previously followed  by dr clance sleep study 02-11-2013  very severe osa w/ AHI 108/hr had used cpap until 2017:   repeat sleep study 02-14-2018 in epic severe osa w/ hypoxemia, AHI 46.3/hr titrated 04-15-2018 used cpap for awhile   Persistent atrial fibrillation (HCC) 03/2011   cardiologist--   s/p TEE-DCCV (failed);  Amiodarone  started   Current Outpatient Medications  Medication Sig Dispense Refill   acetaminophen  (TYLENOL ) 500 MG tablet Take 2 tablets (1,000 mg total) by mouth every 6 (six) hours as needed for mild pain (pain score 1-3) (or Fever >/= 101).     atorvastatin  (LIPITOR ) 80 MG tablet Take 1 tablet (80 mg total) by mouth every morning. 30 tablet 0   ezetimibe  (ZETIA ) 10 MG tablet Take 1 tablet (10 mg total) by mouth every morning. 30 tablet 0   insulin  glargine (LANTUS  SOLOSTAR) 100 UNIT/ML Solostar Pen Inject 20 Units into the skin every morning. 15 mL 3   metFORMIN  (GLUCOPHAGE ) 1000 MG tablet Take 1 tablet (1,000 mg total) by mouth 2 (two) times daily with a meal. 60 tablet 0   metoprolol  tartrate (LOPRESSOR ) 25 MG tablet Take 1 tablet (25 mg total) by mouth 2 (two) times daily. 60 tablet 0   pantoprazole  (PROTONIX ) 40 MG tablet  Take 1 tablet (40 mg total) by mouth daily. 30 tablet 0   rivaroxaban  (XARELTO ) 20 MG TABS tablet Take 1 tablet (20 mg total) by mouth every morning. 30 tablet 0   Semaglutide ,0.25 or 0.5MG /DOS, 2 MG/3ML SOPN Inject 0.5 mg into the skin once a week. 3 mL 1   tamsulosin  (FLOMAX ) 0.4 MG CAPS capsule Take 1 capsule (0.4 mg total) by mouth daily after supper. 90 capsule 0   traMADol  (ULTRAM ) 50 MG tablet Take 1 tablet (50 mg total) by mouth every 6 (six) hours as needed. 10 tablet 0   No current facility-administered medications for this encounter.   BP 118/74   Pulse 73   Ht 6\' 2"  (1.88 m)   Wt 110.9 kg (244 lb 6.4 oz)   SpO2 96%   BMI 31.38 kg/m   Wt Readings from Last 3 Encounters:  07/14/23 110.9 kg (244 lb 6.4 oz)  07/06/23 108.9 kg (240 lb 1.3 oz)  04/21/23 108.9 kg (240 lb)   PHYSICAL EXAM: General:  NAD. No resp difficulty, walked into clinic, disheveled  HEENT: Normal Neck: Supple. No JVD. Cor: Irregular rate & rhythm. No rubs, gallops or murmurs. Lungs: Clear Abdomen: Soft, nontender, obese.  Extremities: No cyanosis, clubbing, rash, edema Neuro: Alert & oriented x 3, moves all 4 extremities w/o difficulty. Tangential speech  ASSESSMENT & PLAN: 1. Chronic systolic => diastolic CHF: Had [facility] 04/2016 showing minimal CAD.  Nonischemic cardiomyopathy, possibly tachycardia-mediated in the setting of rapid atrial fibrillation.   Echo 04/2016 EF 15% but improved to 40-45% on TEE in 7/18 and 55-60% by 8/19 echo.  Echo in 12/21 showed that EF remains 55% despite being back in atrial fibrillation.  Echo 11/23 showed EF 50-55%. Echo 5/25 EF 50-55%, RV mildly reduced. NYHA class II symptoms. He is not volume overloaded on exam. - Refusing paramedicine. I am worried about medication compliance so will hold off on starting MRA or ARB today. BMET today - No SGLT2i with A1C > 15 2. Atrial fibrillation: Chronic atrial fibrillation.  Was thought to have tachy-mediated CMP in past, but EF 55% on  12/21 echo in setting of good rate control.  Previously decided not to cardiovert him again as he broke through amiodarone  and was not particularly compliant. He remains in atrial fibrillation today, rate controlled. He is asymptomatic. - Continue Xarelto  20 mg daily. Discussed importance of compliance. No bleeding today. CBC today. - Continue lopressor  25 mg bid. - He has seen EP, pursing rate control strategy. 3. HLD: LDL 108 (2/25).  - Continue atorvastatin  80 mg daily + Zetia  10 mg daily. Not taking consistently, again discussed importance of taking meds. 4. OSA: Unable to complete his split night sleep study. He is not interested in wearing CPAP. 5. Diabetes: Per primary, has neuropathy. Most recent A1C 15 6. Bladder cancer: s/p treatment.  - Follows with Urology. 7. Non compliance: Difficulty re-directing and he has tangential speech. This complicates compliance. He uses bubble packs. We discussed referral to paramedicine today, he adamantly refuses. We discussed strategies today to help with mediation compliance (put meds by the bed).  Follow up in 3 weeks with PharmD (add spiro vs losartan if able) and 4 months with Dr. Mitzie Anda.   Arlice Bene [facility] FNP-BC 07/14/2023

## 2023-07-13 NOTE — Telephone Encounter (Signed)
 Called to confirm/remind patient of their appointment at the [facility] on 07/14/23.   Appointment:   [x] Confirmed  [] Left mess   [] No answer/No voice mail  [] VM Full/unable to leave message  [] Phone not in service  Patient reminded to bring all medications and/or complete list.  Confirmed patient has transportation. Gave directions, instructed to utilize valet parking.

## 2023-07-14 ENCOUNTER — Ambulatory Visit (HOSPITAL_COMMUNITY): Payer: Self-pay | Admitting: Family Medicine

## 2023-07-14 ENCOUNTER — Encounter (HOSPITAL_COMMUNITY): Payer: Self-pay

## 2023-07-14 ENCOUNTER — Ambulatory Visit (HOSPITAL_COMMUNITY)
Admission: RE | Admit: 2023-07-14 | Discharge: 2023-07-14 | Disposition: A | Discharge status: Home / Self Care | Source: Ambulatory Visit | Attending: Family Medicine | Admitting: Family Medicine

## 2023-07-14 VITALS — BP 118/74 | HR 73 | Ht 74.0 in | Wt 244.4 lb

## 2023-07-14 DIAGNOSIS — Z91148 Patient's other noncompliance with medication regimen for other reason: Secondary | ICD-10-CM | POA: Insufficient documentation

## 2023-07-14 DIAGNOSIS — Z91199 Patient's noncompliance with other medical treatment and regimen due to unspecified reason: Secondary | ICD-10-CM | POA: Diagnosis not present

## 2023-07-14 DIAGNOSIS — I428 Other cardiomyopathies: Secondary | ICD-10-CM | POA: Insufficient documentation

## 2023-07-14 DIAGNOSIS — E119 Type 2 diabetes mellitus without complications: Secondary | ICD-10-CM | POA: Diagnosis not present

## 2023-07-14 DIAGNOSIS — I480 Paroxysmal atrial fibrillation: Secondary | ICD-10-CM | POA: Insufficient documentation

## 2023-07-14 DIAGNOSIS — Z5986 Financial insecurity: Secondary | ICD-10-CM | POA: Diagnosis not present

## 2023-07-14 DIAGNOSIS — Z8249 Family history of ischemic heart disease and other diseases of the circulatory system: Secondary | ICD-10-CM | POA: Insufficient documentation

## 2023-07-14 DIAGNOSIS — E785 Hyperlipidemia, unspecified: Secondary | ICD-10-CM | POA: Diagnosis not present

## 2023-07-14 DIAGNOSIS — Z8551 Personal history of malignant neoplasm of bladder: Secondary | ICD-10-CM | POA: Diagnosis not present

## 2023-07-14 DIAGNOSIS — I5042 Chronic combined systolic (congestive) and diastolic (congestive) heart failure: Secondary | ICD-10-CM | POA: Diagnosis not present

## 2023-07-14 DIAGNOSIS — Z7901 Long term (current) use of anticoagulants: Secondary | ICD-10-CM | POA: Insufficient documentation

## 2023-07-14 DIAGNOSIS — Z7984 Long term (current) use of oral hypoglycemic drugs: Secondary | ICD-10-CM | POA: Insufficient documentation

## 2023-07-14 DIAGNOSIS — I482 Chronic atrial fibrillation, unspecified: Secondary | ICD-10-CM | POA: Insufficient documentation

## 2023-07-14 DIAGNOSIS — E1140 Type 2 diabetes mellitus with diabetic neuropathy, unspecified: Secondary | ICD-10-CM | POA: Diagnosis not present

## 2023-07-14 DIAGNOSIS — I5032 Chronic diastolic (congestive) heart failure: Secondary | ICD-10-CM

## 2023-07-14 DIAGNOSIS — Z79899 Other long term (current) drug therapy: Secondary | ICD-10-CM | POA: Diagnosis not present

## 2023-07-14 DIAGNOSIS — Z794 Long term (current) use of insulin: Secondary | ICD-10-CM | POA: Diagnosis not present

## 2023-07-14 DIAGNOSIS — Z833 Family history of diabetes mellitus: Secondary | ICD-10-CM | POA: Insufficient documentation

## 2023-07-14 DIAGNOSIS — I4819 Other persistent atrial fibrillation: Secondary | ICD-10-CM | POA: Diagnosis not present

## 2023-07-14 DIAGNOSIS — G4733 Obstructive sleep apnea (adult) (pediatric): Secondary | ICD-10-CM | POA: Diagnosis not present

## 2023-07-14 DIAGNOSIS — C679 Malignant neoplasm of bladder, unspecified: Secondary | ICD-10-CM | POA: Diagnosis not present

## 2023-07-14 DIAGNOSIS — I2510 Atherosclerotic heart disease of native coronary artery without angina pectoris: Secondary | ICD-10-CM | POA: Insufficient documentation

## 2023-07-14 LAB — BASIC METABOLIC PANEL WITH GFR
Anion gap: 10 (ref 5–15)
BUN: 15 mg/dL (ref 8–23)
CO2: 23 mmol/L (ref 22–32)
Calcium: 8.7 mg/dL — ABNORMAL LOW (ref 8.9–10.3)
Chloride: 99 mmol/L (ref 98–111)
Creatinine, Ser: 1.21 mg/dL (ref 0.61–1.24)
GFR, Estimated: >60 mL/min (ref >60)
Glucose, Bld: 178 mg/dL — ABNORMAL HIGH (ref 70–99)
Potassium: 4.3 mmol/L (ref 3.5–5.1)
Sodium: 132 mmol/L — ABNORMAL LOW (ref 135–145)

## 2023-07-14 LAB — CBC
HCT: 41 % (ref 39.0–52.0)
Hemoglobin: 14.1 g/dL (ref 13.0–17.0)
MCH: 29.1 pg (ref 26.0–34.0)
MCHC: 34.4 g/dL (ref 30.0–36.0)
MCV: 84.7 fL (ref 80.0–100.0)
Platelets: 378 10*3/uL (ref 150–400)
RBC: 4.84 MIL/uL (ref 4.22–5.81)
RDW: 14.6 % (ref 11.5–15.5)
WBC: 8.7 10*3/uL (ref 4.0–10.5)
nRBC: 0 % (ref 0.0–0.2)

## 2023-07-14 NOTE — Patient Instructions (Addendum)
 Good to see you today!   Labs done today, your results will be available in MyChart, we will contact you for abnormal readings.  Your physician recommends that you schedule a follow-up appointment:4 months(October) Call office in August to schedule an appointment  If you have any questions or concerns before your next appointment please send us  a message through [place] or call our office at [phone].    TO LEAVE A MESSAGE FOR THE NURSE SELECT OPTION 2, PLEASE LEAVE A MESSAGE INCLUDING: YOUR NAME DATE OF BIRTH CALL BACK NUMBER REASON FOR CALL**this is important as we prioritize the call backs  YOU WILL RECEIVE A CALL BACK THE SAME DAY AS LONG AS YOU CALL BEFORE 4:00 PM At the [facility], you and your health needs are our priority. As part of our continuing mission to provide you with exceptional heart care, we have created designated Provider Care Teams. These Care Teams include your primary Cardiologist (physician) and Advanced Practice Providers (APPs- Physician Assistants and Nurse Practitioners) who all work together to provide you with the care you need, when you need it.   You may see any of the following providers on your designated Care Team at your next follow up: Dr Jules Oar Dr Peder Bourdon Dr. Alwin Baars Dr. Arta Lark Amy Marijane Shoulders, NP Ruddy Corral, [place] [facility] [place], [place] Dennise Fitz, NP [place] Lee, NP Shawnee Dellen, NP Luster Salters, PharmD Bevely Brush, PharmD   Please be sure to bring in all your medications bottles to every appointment.    Thank you for choosing  [facility]

## 2023-07-15 ENCOUNTER — Other Ambulatory Visit: Payer: Self-pay

## 2023-07-15 ENCOUNTER — Other Ambulatory Visit (HOSPITAL_COMMUNITY): Payer: Self-pay

## 2023-07-15 ENCOUNTER — Telehealth: Payer: Self-pay

## 2023-07-15 DIAGNOSIS — R7989 Other specified abnormal findings of blood chemistry: Secondary | ICD-10-CM

## 2023-07-15 MED ORDER — TALICIA 250-12.5-10 MG PO CPDR
4.0000 | DELAYED_RELEASE_CAPSULE | Freq: Three times a day (TID) | ORAL | 0 refills | Status: DC
  Filled 2023-07-15: qty 168, 14d supply, fill #0

## 2023-07-15 NOTE — Telephone Encounter (Signed)
 Left VM for patient to come in for repeat labs.

## 2023-07-16 ENCOUNTER — Other Ambulatory Visit (HOSPITAL_COMMUNITY): Payer: Self-pay

## 2023-07-20 DIAGNOSIS — H40013 Open angle with borderline findings, low risk, bilateral: Secondary | ICD-10-CM | POA: Diagnosis not present

## 2023-07-22 ENCOUNTER — Ambulatory Visit: Admitting: Family Medicine

## 2023-07-29 ENCOUNTER — Other Ambulatory Visit (HOSPITAL_COMMUNITY): Payer: Self-pay

## 2023-08-03 ENCOUNTER — Other Ambulatory Visit

## 2023-08-04 ENCOUNTER — Other Ambulatory Visit

## 2023-08-04 DIAGNOSIS — R972 Elevated prostate specific antigen [PSA]: Secondary | ICD-10-CM

## 2023-08-04 NOTE — Telephone Encounter (Signed)
 The pt returned call and has been advised of the results. He has also been advised of the appt information for [facility]. He will pick up the prescription and aware we will call him in 2 months for diatherix and also ERCP   No questions at this time.

## 2023-08-05 ENCOUNTER — Ambulatory Visit: Payer: Self-pay

## 2023-08-05 LAB — PSA: Prostate Specific Ag, Serum: 5.2 ng/mL — ABNORMAL HIGH (ref 0.0–4.0)

## 2023-08-06 ENCOUNTER — Telehealth: Payer: Self-pay | Admitting: Gastroenterology

## 2023-08-06 ENCOUNTER — Other Ambulatory Visit (HOSPITAL_COMMUNITY): Payer: Self-pay

## 2023-08-06 NOTE — Telephone Encounter (Signed)
 Inbound call from patient requesting to know if he should still take Talicia  medication. States he did not pick it up in time and it was put back on shelf. Requesting a call back. Please advise, thank you

## 2023-08-06 NOTE — Telephone Encounter (Signed)
 Returned call to patient. Advised, that YES, he needs to complete prescription for Talicia . Detailed message left on patient voicemail.

## 2023-08-07 ENCOUNTER — Other Ambulatory Visit (HOSPITAL_COMMUNITY): Payer: Self-pay

## 2023-08-07 ENCOUNTER — Other Ambulatory Visit: Payer: Self-pay

## 2023-08-10 ENCOUNTER — Ambulatory Visit (INDEPENDENT_AMBULATORY_CARE_PROVIDER_SITE_OTHER): Admitting: Urology

## 2023-08-10 VITALS — BP 123/76 | HR 84

## 2023-08-10 DIAGNOSIS — R972 Elevated prostate specific antigen [PSA]: Secondary | ICD-10-CM | POA: Diagnosis not present

## 2023-08-10 DIAGNOSIS — Z8551 Personal history of malignant neoplasm of bladder: Secondary | ICD-10-CM

## 2023-08-10 LAB — URINALYSIS, ROUTINE W REFLEX MICROSCOPIC
Bilirubin, UA: NEGATIVE
Glucose, UA: NEGATIVE
Ketones, UA: NEGATIVE
Nitrite, UA: NEGATIVE
RBC, UA: NEGATIVE
Specific Gravity, UA: 1.03 (ref 1.005–1.030)
Urobilinogen, Ur: 1 mg/dL (ref 0.2–1.0)
pH, UA: 5.5 (ref 5.0–7.5)

## 2023-08-10 LAB — MICROSCOPIC EXAMINATION

## 2023-08-10 NOTE — Progress Notes (Signed)
 08/10/2023 11:49 AM   Ozell JAYSON Irving 1955/02/21 [id number]  Referring provider: Chandra Toribio POUR, MD [address] [place],  [place] [address]  No chief complaint on file.   HPI:  F/u -    1) bladder cancer-patient diagnosed Nov 2022 with high-grade T2 right bladder, right bladder neck and left high-grade CIS, and right bladder and superior high-grade T1 disease. In Jan 2023, repeat/max TUR with large patches on the bladder neck, right bladder wall, superior, left bladder wall with residual disease.  From Jan - Mar 2023 he underwent radiation+chemo to preserve bladder. Apr 2025 cystoscopy benign.    Primary treatment: Jan - Mar 2023 - max TUR+radiation+chemo   Biopsy/TUR: November 2022-TURBT - HG T2 right, right BN HG CIS, right wall HG T1, superior HG T1, left bladder wall HG CIS Jan 2023 - HGT1, CIS    Staging: Aug 2022 CT abd and pelvis 08/22 - a 2.5 cm right posterior bladder mass. No LAD or bone lesions.  Jul 2023 CT C/A/P - no evidence of disease  Oct 2023 CT C/A/P - NED  Nov 2024 CT C/A/P - NED    Surveillance (NCCN):  CTU or MRU (image upper tracts + axial imaging of abdomen/pelvis) every 3-6 mo x 2 years and consider annually   CT chest (preferred) or chest x-ray every 3-6 mo x 2 years and then annually    2) freq, urgency - AUASS = 14. Urgency and nocturia. On tamsulosin  and oxybutynin . He had a sleep study pending from pulmonology.  He has obesity. His Cr was 1.01 Nov 2020 and 1.92 in Apr 2023. AuASS = 2. His blood sugar was high and his Cr was as high as 2.06 Apr 2021 and 1.92 in Apr 2023. Cr 1.57 in Nov 2023. On insulin  in 2024.    3) PSA elevation - His 2023 PSA was 6.9, Dec 2024 PSA 4.5. No biopsy. Small prostate on imaging and benign MAY 2025.    Today, seen for the above. His 06/08/2023 CT chest abdomen and pelvis benign.  He then had abdominal pain and underwent a 07/03/2023 CT abdomen and pelvis in the 07/04/2023 MR abdomen.  All these were benign.   Underwent a 07/07/2023 laparoscopic cholecystectomy.  June 2025 PSA 5.2.  Prostate was 37 g on May 2025 CT (PSAD 0.14).    He has a history of A. fib and an atrial thrombus and is on Eliquis . 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     PMH: Past Medical History:  Diagnosis Date   Chronic combined systolic and diastolic heart failure (HCC)    followed by cardiologist;  a. Echo 03/27/11: EF 50-55%, inferior hypokinesis, moderate LAE, mild RVE, normal pulmonary pressures. ;   b.  TEE (04/03/11): EF 40-45%, mild MR moderate LAE, no LAA clot, mild RAE;  c.  Echocardiogram (02/2013): EF 60-65%, Gr 2 DD, mildly dilated Ao root (Ao root dimension 39 mm), MAC, mild LAE, normal RVF   Complication of anesthesia    pt unable to tolerate versed  and fentanyl  sedation and required general anesthesia with TEE 02/ 2013   Depression    Diabetes mellitus, type 2 (HCC)    followed by pcp  (02-21-2021 pt stated does not check blood sugar )   Hx of cardiovascular stress test    Lexiscan  Myoview (02/2013): No ischemia or scar, EF 51%, low risk;  cardiac cath 04-21-2016  no CAD, NICM   Malignant neoplasm of urinary bladder (HCC) 12/2020   urologist---  dr nieves;  oncologist-- dr amadeo,  high grade muscle invasion   Morbid obesity (HCC)    NICM (nonischemic cardiomyopathy) (HCC) 04/2016   followed by cardiology;   dx 03/ 2018 per TEE ef 15%;  TEE 04/ 2018 ef 25%;  TEE 07/ 2018 ef 40%;  echo 12/ 2021 ef 55%   OSA (obstructive sleep apnea)    followed by dr t. turner--  no cpap/ bipap:: previously followed  by dr clance sleep study 02-11-2013  very severe osa w/ AHI 108/hr had used cpap until 2017:   repeat sleep study 02-14-2018 in epic severe osa w/ hypoxemia, AHI 46.3/hr titrated 04-15-2018 used cpap for awhile   Persistent atrial fibrillation (HCC) 03/2011   cardiologist--   s/p TEE-DCCV (failed);  Amiodarone  started    Surgical History: Past Surgical History:  Procedure Laterality Date   BILIARY  BRUSHING  07/06/2023   Procedure: BRUSH BIOPSY, BILE DUCT;  Surgeon: Wilhelmenia Aloha Raddle., MD;  Location: [facility] ENDOSCOPY;  Service: Gastroenterology;;   BILIARY STENT PLACEMENT  07/06/2023   Procedure: INSERTION, STENT, BILE DUCT;  Surgeon: Wilhelmenia Aloha Raddle., MD;  Location: [facility] ENDOSCOPY;  Service: Gastroenterology;;   BIOPSY OF SKIN SUBCUTANEOUS TISSUE AND/OR MUCOUS MEMBRANE  07/06/2023   Procedure: BIOPSY, SKIN, SUBCUTANEOUS TISSUE, OR MUCOUS MEMBRANE;  Surgeon: Wilhelmenia Aloha Raddle., MD;  Location: [facility] ENDOSCOPY;  Service: Gastroenterology;;   CARDIOVERSION  04/01/2011   Procedure: CARDIOVERSION;  Surgeon: Redell GORMAN Shallow, MD;  Location: [facility] ENDOSCOPY;  Service: Cardiovascular;  Laterality: N/A;   CARDIOVERSION  04/03/2011   Procedure: CARDIOVERSION;  Surgeon: Redell GORMAN Shallow, MD;  Location: [facility] ENDOSCOPY;  Service: Cardiovascular;  Laterality: N/A;   CARDIOVERSION  04/03/2011   Procedure: CARDIOVERSION;  Surgeon: Redell GORMAN Shallow, MD;  Location: [facility] OR;  Service: Cardiovascular;  Laterality: N/A;   CARDIOVERSION N/A 06/15/2014   Procedure: CARDIOVERSION;  Surgeon: Ezra GORMAN Shuck, MD;  Location: [facility] ENDOSCOPY;  Service: Cardiovascular;  Laterality: N/A;   CARDIOVERSION N/A 09/08/2016   Procedure: CARDIOVERSION;  Surgeon: Shuck Ezra GORMAN, MD;  Location: [facility] ENDOSCOPY;  Service: Cardiovascular;  Laterality: N/A;   CHOLECYSTECTOMY N/A 07/07/2023   Procedure: LAPAROSCOPIC CHOLECYSTECTOMY;  Surgeon: Teresa Lonni HERO, MD;  Location: [facility] OR;  Service: General;  Laterality: N/A;  WITH ICG DYE   ERCP N/A 07/06/2023   Procedure: ERCP, WITH INTERVENTION IF INDICATED;  Surgeon: Wilhelmenia Aloha Raddle., MD;  Location: [facility] ENDOSCOPY;  Service: Gastroenterology;  Laterality: N/A;   I & D EXTREMITY Right 09/19/2015   Procedure: IRRIGATION AND DEBRIDEMENT EXTREMITY;  Surgeon: Lonni CINDERELLA Poli, MD;  Location: [facility] OR;  Service: Orthopedics;  Laterality: Right;   RIGHT/LEFT HEART CATH AND CORONARY ANGIOGRAPHY  N/A 04/21/2016   Procedure: Right/Left Heart Cath and Coronary Angiography;  Surgeon: Ezra GORMAN Shuck, MD;  Location: [facility] INVASIVE CV LAB;  Service: Cardiovascular;  Laterality: N/A;   SPHINCTEROTOMY  07/06/2023   Procedure: SPHINCTEROTOMY, BILIARY;  Surgeon: Wilhelmenia Aloha Raddle., MD;  Location: [facility] ENDOSCOPY;  Service: Gastroenterology;;   TEE WITHOUT CARDIOVERSION  04/01/2011   Procedure: TRANSESOPHAGEAL ECHOCARDIOGRAM (TEE);  Surgeon: Redell GORMAN Shallow, MD;  Location: [facility] ENDOSCOPY;  Service: Cardiovascular;  Laterality: N/A;   TEE WITHOUT CARDIOVERSION  04/03/2011   Procedure: TRANSESOPHAGEAL ECHOCARDIOGRAM (TEE);  Surgeon: Redell GORMAN Shallow, MD;  Location: [facility] ENDOSCOPY;  Service: Cardiovascular;  Laterality: N/A;   TEE WITHOUT CARDIOVERSION  04/03/2011   Procedure: TRANSESOPHAGEAL ECHOCARDIOGRAM (TEE);  Surgeon: Redell GORMAN Shallow, MD;  Location: [facility] OR;  Service: Cardiovascular;  Laterality: N/A;   TEE  WITHOUT CARDIOVERSION N/A 04/24/2016   Procedure: TRANSESOPHAGEAL ECHOCARDIOGRAM (TEE);  Surgeon: Ezra GORMAN Shuck, MD;  Location: [facility] ENDOSCOPY;  Service: Cardiovascular;  Laterality: N/A;   TEE WITHOUT CARDIOVERSION N/A 05/26/2016   Procedure: TRANSESOPHAGEAL ECHOCARDIOGRAM (TEE);  Surgeon: Ezra GORMAN Shuck, MD;  Location: [facility] ENDOSCOPY;  Service: Cardiovascular;  Laterality: N/A;   TEE WITHOUT CARDIOVERSION N/A 09/08/2016   Procedure: TRANSESOPHAGEAL ECHOCARDIOGRAM (TEE);  Surgeon: Shuck Ezra GORMAN, MD;  Location: [facility] ENDOSCOPY;  Service: Cardiovascular;  Laterality: N/A;   TRANSURETHRAL RESECTION OF BLADDER TUMOR N/A 12/14/2020   Procedure: TRANSURETHRAL RESECTION OF BLADDER TUMOR (TURBT) 2-5 cm/ POST OPERTIVE INSTILLATION OF GEMCITABINE ;  Surgeon: Nieves Cough, MD;  Location: [facility];  Service: Urology;  Laterality: N/A;   TRANSURETHRAL RESECTION OF BLADDER TUMOR N/A 02/22/2021   Procedure: TRANSURETHRAL RESECTION OF BLADDER TUMOR (TURBT);  Surgeon: Nieves Cough, MD;  Location:  [facility];  Service: Urology;  Laterality: N/A;    Home Medications:  Allergies as of 08/10/2023   No Known Allergies      Medication List        Accurate as of August 10, 2023 11:49 AM. If you have any questions, ask your nurse or doctor.          acetaminophen  500 MG tablet Commonly known as: TYLENOL  Take 2 tablets (1,000 mg total) by mouth every 6 (six) hours as needed for mild pain (pain score 1-3) (or Fever >/= 101).   atorvastatin  80 MG tablet Commonly known as: LIPITOR  Take 1 tablet (80 mg total) by mouth every morning.   ezetimibe  10 MG tablet Commonly known as: ZETIA  Take 1 tablet (10 mg total) by mouth every morning.   Lantus  SoloStar 100 UNIT/ML Solostar Pen Generic drug: insulin  glargine Inject 20 Units into the skin every morning.   metFORMIN  1000 MG tablet Commonly known as: GLUCOPHAGE  Take 1 tablet (1,000 mg total) by mouth 2 (two) times daily with a meal.   metoprolol  tartrate 25 MG tablet Commonly known as: LOPRESSOR  Take 1 tablet (25 mg total) by mouth 2 (two) times daily.   pantoprazole  40 MG tablet Commonly known as: PROTONIX  Take 1 tablet (40 mg total) by mouth daily.   rivaroxaban  20 MG Tabs tablet Commonly known as: XARELTO  Take 1 tablet (20 mg total) by mouth every morning.   Semaglutide (0.25 or 0.5MG /DOS) 2 MG/3ML Sopn Inject 0.5 mg into the skin once a week.   Talicia  250-12.5-10 MG Cpdr Generic drug: Amoxicill-Rifabutin -Omeprazole  Take 4 capsules by mouth in the morning, at noon, and at bedtime.   tamsulosin  0.4 MG Caps capsule Commonly known as: FLOMAX  Take 1 capsule (0.4 mg total) by mouth daily after supper.   traMADol  50 MG tablet Commonly known as: Ultram  Take 1 tablet (50 mg total) by mouth every 6 (six) hours as needed.        Allergies: No Known Allergies  Family History: Family History  Problem Relation Age of Onset   Diabetes Father        died in his 16s   Hypertension Father    Seizures  Mother        died in her 72s   Hypertension Sister    Hypertension Paternal Grandfather    Heart attack Neg Hx    Stroke Neg Hx     Social History:  reports that he has never smoked. He has never used smokeless tobacco. He reports current alcohol use. He reports that he does not currently use drugs.   Physical  Exam: BP 123/76   Pulse 84   Constitutional:  Alert and oriented, No acute distress. HEENT: [place] AT, moist mucus membranes.  Trachea midline, no masses. Cardiovascular: No clubbing, cyanosis, or edema. Respiratory: Normal respiratory effort, no increased work of breathing. GI: Abdomen is soft, nontender, nondistended, no abdominal masses GU: No CVA tenderness Lymph: No cervical or inguinal lymphadenopathy. Skin: No rashes, bruises or suspicious lesions. Neurologic: Grossly intact, no focal deficits, moving all 4 extremities. Psychiatric: Normal mood and affect.  Laboratory Data: Lab Results  Component Value Date   WBC 8.7 07/14/2023   HGB 14.1 07/14/2023   HCT 41.0 07/14/2023   MCV 84.7 07/14/2023   PLT 378 07/14/2023    Lab Results  Component Value Date   CREATININE 1.21 07/14/2023    No results found for: PSA  No results found for: TESTOSTERONE  Lab Results  Component Value Date   HGBA1C 15.0 04/21/2023    Urinalysis    Component Value Date/Time   COLORURINE AMBER (A) 07/03/2023 1119   APPEARANCEUR HAZY (A) 07/03/2023 1119   APPEARANCEUR Clear 05/18/2023 1028   LABSPEC 1.023 07/03/2023 1119   PHURINE 5.0 07/03/2023 1119   GLUCOSEU >=500 (A) 07/03/2023 1119   HGBUR NEGATIVE 07/03/2023 1119   BILIRUBINUR MODERATE (A) 07/03/2023 1119   BILIRUBINUR Negative 05/18/2023 1028   KETONESUR 5 (A) 07/03/2023 1119   PROTEINUR 30 (A) 07/03/2023 1119   UROBILINOGEN 0.2 10/20/2019 1137   UROBILINOGEN 0.2 02/13/2017 1540   NITRITE NEGATIVE 07/03/2023 1119   LEUKOCYTESUR SMALL (A) 07/03/2023 1119    Lab Results  Component Value Date   LABMICR See  below: 05/18/2023   WBCUA 6-10 (A) 05/18/2023   LABEPIT 0-10 05/18/2023   MUCUS Present 08/26/2021   BACTERIA FEW (A) 07/03/2023    Pertinent Imaging: Ct x 2, mri abd   Assessment & Plan:    1. Elevated PSA (Primary) PSA remains elevated with PSAD 0.14. Discussed again etiology of PSA elevation. His cousin has PCa. Discussed nature r/b/a to AS vs MRI prostate. He wants to continue surveillance and continue to recover from gallbladder which is reasonable.  - Urinalysis, Routine w reflex microscopic  2. H/o bladder ca - NED on imaging . Check cysto in 4 months.   No follow-ups on file.  Donnice Brooks, MD  [facility]  [address] [place], [place] [address] [phone]

## 2023-08-17 ENCOUNTER — Encounter: Payer: Self-pay | Admitting: Family Medicine

## 2023-08-17 ENCOUNTER — Other Ambulatory Visit (HOSPITAL_COMMUNITY): Payer: Self-pay

## 2023-08-17 ENCOUNTER — Ambulatory Visit (INDEPENDENT_AMBULATORY_CARE_PROVIDER_SITE_OTHER): Admitting: Family Medicine

## 2023-08-17 ENCOUNTER — Other Ambulatory Visit: Payer: Self-pay

## 2023-08-17 VITALS — BP 108/71 | HR 94 | Ht 74.0 in | Wt 240.5 lb

## 2023-08-17 DIAGNOSIS — Z7984 Long term (current) use of oral hypoglycemic drugs: Secondary | ICD-10-CM

## 2023-08-17 DIAGNOSIS — R7989 Other specified abnormal findings of blood chemistry: Secondary | ICD-10-CM | POA: Diagnosis not present

## 2023-08-17 DIAGNOSIS — N1831 Chronic kidney disease, stage 3a: Secondary | ICD-10-CM | POA: Diagnosis not present

## 2023-08-17 DIAGNOSIS — E119 Type 2 diabetes mellitus without complications: Secondary | ICD-10-CM

## 2023-08-17 DIAGNOSIS — Z9049 Acquired absence of other specified parts of digestive tract: Secondary | ICD-10-CM | POA: Diagnosis not present

## 2023-08-17 LAB — POCT GLYCOSYLATED HEMOGLOBIN (HGB A1C): Hemoglobin A1C: 6 % — AB (ref 4.0–5.6)

## 2023-08-17 NOTE — Assessment & Plan Note (Signed)
 Continue to monitor creatinine.  Stable.

## 2023-08-17 NOTE — Assessment & Plan Note (Signed)
 HbA1c of 6.0. previously significantly elevated prior to that at > 15.   Reports adherence with daily insulin  and weekly Ozempic . Making efforts to improve diet. Denies hypoglycemic symptoms.still gets oral medications through pill packs sent to him monthly.   - Continue current insulin  and Ozempic  regimen. - Discussed increasing Ozempic  dose for weight loss if desired, but patient is satisfied with current blood sugar control. - Will obtain labs, including lipid panel and CMP.  - Plan for urine microalbumin test today if able, otherwise at next visit.

## 2023-08-17 NOTE — Patient Instructions (Addendum)
 It was nice to see you today,  We addressed the following topics today: - your A1c was much better.  It was 6.0.  continue your current medications.    Have a great day,  Rolan Slain, MD

## 2023-08-17 NOTE — Progress Notes (Unsigned)
**Note Darius-Identified via Obfuscation**  Established Patient Office Visit  Subjective   Patient ID: Darius [name], male    DOB: [birth year]  Age: 68 y.o. MRN: [id number]  Chief Complaint  Patient presents with   Diabetes    HPI  Subjective - Follow-up post-cholecystectomy. Healing well, incision sites barely visible. - No issues with eating regular food. Tries to avoid fatty foods. Reports diarrhea prior to cholecystectomy, which may have been related to the gallbladder issue. - Reports a history of an intestinal or stomach infection diagnosed at the time of the cholecystectomy. A prescription was given but never picked up. Currently asymptomatic. - Blood sugar control is good. HbA1c was 6.0. Admits to occasional dietary indiscretions but is trying to eat healthier, including more fruit. Denies symptoms of hypoglycemia like weakness, but has felt nauseous occasionally, attributing it to exertion like mowing the lawn. - Discussed sleep schedule. Notes difficulty with sleep when working third shift in the past. - Discussed upcoming lab work with gastroenterologist for liver function tests prior to follow-up on 09/11/2023. Will attempt to have labs ordered by me drawn at the same time at [facility]. - Denies needing any refills.  Medications Takes daily insulin  and weekly Ozempic  for diabetes. Reports being adherent with both medications. Ran out of Ozempic  today and will pick up a refill tomorrow. Discussed proper storage of insulin , keeping it out of the heat.  PMH, PSH, FH, Social Hx PMHx: Diabetes mellitus type 2, hypertension, hyperlipidemia, recent cholecystectomy. PSH: Cholecystectomy.  ROS Pertinent positives: Occasional nausea with exertion. Pertinent negatives: Denies symptoms of hypoglycemia. Denies current abdominal pain or diarrhea.  The 10-year ASCVD risk score (Arnett DK, et al., 2019) is: 22.4%  Health Maintenance Due  Topic Date Due   DTaP/Tdap/Td (1 - Tdap) Never done   Zoster Vaccines- Shingrix (1  of 2) Never done   Colonoscopy  Never done   Pneumococcal Vaccine: 50+ Years (2 of 2 - PCV) 03/26/2012   Diabetic kidney evaluation - Urine ACR  07/26/2020   OPHTHALMOLOGY EXAM  08/15/2020   Medicare Annual Wellness (AWV)  07/30/2021   COVID-19 Vaccine (4 - 2024-25 season) 10/12/2022      Objective:     BP 108/71   Pulse 94   Ht 6' 2 (1.88 m)   Wt 240 lb 8 oz (109.1 kg)   SpO2 96%   BMI 30.88 kg/m    Physical Exam Gen: alert, oriented Pulm: no resp distress Psych: pleasant, amiable.    Results for orders placed or performed in visit on 08/17/23  POCT HgB A1C  Result Value Ref Range   Hemoglobin A1C 6.0 (A) 4.0 - 5.6 %   HbA1c POC (<> result, manual entry)     HbA1c, POC (prediabetic range)     HbA1c, POC (controlled diabetic range)          Assessment & Plan:   Type 2 diabetes mellitus without complication, without long-term current use of insulin  (HCC) Assessment & Plan: HbA1c of 6.0. previously significantly elevated prior to that at > 15.   Reports adherence with daily insulin  and weekly Ozempic . Making efforts to improve diet. Denies hypoglycemic symptoms.still gets oral medications through pill packs sent to him monthly.   - Continue current insulin  and Ozempic  regimen. - Discussed increasing Ozempic  dose for weight loss if desired, but patient is satisfied with current blood sugar control. - Will obtain labs, including lipid panel and CMP.  - Plan for urine microalbumin test today if able, otherwise at next visit.  Orders: -     POCT glycosylated hemoglobin (Hb A1C) -     Microalbumin / creatinine urine ratio -     Basic metabolic panel with GFR; Future -     Lipid panel; Future  Status post cholecystectomy Assessment & Plan: No issues from surgery in may.  Has upcoming f/u with gastroenterology   Abnormal LFTs Assessment & Plan: Repeat cmp   Chronic kidney disease, stage 3a (HCC) Assessment & Plan: Continue to monitor creatinine.  Stable.         Return in about 3 months (around 11/17/2023) for DM.    Darius MARLA Slain, MD

## 2023-08-17 NOTE — Assessment & Plan Note (Signed)
 No issues from surgery in may.  Has upcoming f/u with gastroenterology

## 2023-08-17 NOTE — Assessment & Plan Note (Signed)
Repeat cmp 

## 2023-08-18 ENCOUNTER — Other Ambulatory Visit (HOSPITAL_COMMUNITY): Payer: Self-pay

## 2023-08-18 ENCOUNTER — Other Ambulatory Visit (INDEPENDENT_AMBULATORY_CARE_PROVIDER_SITE_OTHER)

## 2023-08-18 ENCOUNTER — Telehealth: Payer: Self-pay | Admitting: *Deleted

## 2023-08-18 DIAGNOSIS — R7989 Other specified abnormal findings of blood chemistry: Secondary | ICD-10-CM | POA: Diagnosis not present

## 2023-08-18 LAB — MICROALBUMIN / CREATININE URINE RATIO
Creatinine, Urine: 337 mg/dL
Microalb/Creat Ratio: 14 mg/g{creat} (ref 0–29)
Microalbumin, Urine: 47.2 ug/mL

## 2023-08-18 LAB — HEPATIC FUNCTION PANEL
ALT: 35 U/L (ref 0–53)
AST: 25 U/L (ref 0–37)
Albumin: 3.8 g/dL (ref 3.5–5.2)
Alkaline Phosphatase: 94 U/L (ref 39–117)
Bilirubin, Direct: 0.3 mg/dL (ref 0.0–0.3)
Total Bilirubin: 0.9 mg/dL (ref 0.2–1.2)
Total Protein: 7.1 g/dL (ref 6.0–8.3)

## 2023-08-18 NOTE — Telephone Encounter (Signed)
 Contacted pt and he stated that GI would not draw the labs so I scheduled him an appointment to have them drawn.

## 2023-08-18 NOTE — Telephone Encounter (Signed)
 Copied from CRM [id number]. Topic: Appointments - Appointment Scheduling >> Aug 18, 2023 10:15 AM Emylou G wrote: Pls call patient.. Or review recent chart?  He is thinking he needs lab appt again. He is confused on what is needed.

## 2023-08-19 ENCOUNTER — Other Ambulatory Visit: Payer: Self-pay

## 2023-08-20 ENCOUNTER — Ambulatory Visit: Payer: Self-pay | Admitting: Gastroenterology

## 2023-08-20 ENCOUNTER — Other Ambulatory Visit: Payer: Self-pay

## 2023-08-24 ENCOUNTER — Other Ambulatory Visit: Payer: Self-pay

## 2023-08-26 ENCOUNTER — Other Ambulatory Visit: Payer: Self-pay

## 2023-08-27 ENCOUNTER — Other Ambulatory Visit

## 2023-08-27 DIAGNOSIS — E119 Type 2 diabetes mellitus without complications: Secondary | ICD-10-CM

## 2023-08-28 ENCOUNTER — Ambulatory Visit: Payer: Self-pay | Admitting: Family Medicine

## 2023-08-28 LAB — BASIC METABOLIC PANEL WITH GFR
BUN/Creatinine Ratio: 16 (ref 10–24)
BUN: 21 mg/dL (ref 8–27)
CO2: 18 mmol/L — ABNORMAL LOW (ref 20–29)
Calcium: 9.1 mg/dL (ref 8.6–10.2)
Chloride: 104 mmol/L (ref 96–106)
Creatinine, Ser: 1.34 mg/dL — ABNORMAL HIGH (ref 0.76–1.27)
Glucose: 96 mg/dL (ref 70–99)
Potassium: 4.1 mmol/L (ref 3.5–5.2)
Sodium: 138 mmol/L (ref 134–144)
eGFR: 57 mL/min/1.73 — ABNORMAL LOW (ref >59)

## 2023-08-28 LAB — LIPID PANEL
Chol/HDL Ratio: 3.6 ratio (ref 0.0–5.0)
Cholesterol, Total: 158 mg/dL (ref 100–199)
HDL: 44 mg/dL (ref >39)
LDL Chol Calc (NIH): 97 mg/dL (ref 0–99)
Triglycerides: 92 mg/dL (ref 0–149)
VLDL Cholesterol Cal: 17 mg/dL (ref 5–40)

## 2023-09-03 ENCOUNTER — Other Ambulatory Visit: Payer: Self-pay

## 2023-09-11 ENCOUNTER — Other Ambulatory Visit: Payer: Self-pay

## 2023-09-11 ENCOUNTER — Other Ambulatory Visit (INDEPENDENT_AMBULATORY_CARE_PROVIDER_SITE_OTHER)

## 2023-09-11 ENCOUNTER — Encounter: Payer: Self-pay | Admitting: Gastroenterology

## 2023-09-11 ENCOUNTER — Other Ambulatory Visit (HOSPITAL_COMMUNITY): Payer: Self-pay

## 2023-09-11 ENCOUNTER — Ambulatory Visit (INDEPENDENT_AMBULATORY_CARE_PROVIDER_SITE_OTHER): Admitting: Gastroenterology

## 2023-09-11 VITALS — BP 106/64 | HR 70 | Ht 74.0 in | Wt 246.0 lb

## 2023-09-11 DIAGNOSIS — K831 Obstruction of bile duct: Secondary | ICD-10-CM | POA: Diagnosis not present

## 2023-09-11 DIAGNOSIS — Z9889 Other specified postprocedural states: Secondary | ICD-10-CM | POA: Diagnosis not present

## 2023-09-11 DIAGNOSIS — Z8619 Personal history of other infectious and parasitic diseases: Secondary | ICD-10-CM

## 2023-09-11 DIAGNOSIS — Z9049 Acquired absence of other specified parts of digestive tract: Secondary | ICD-10-CM | POA: Diagnosis not present

## 2023-09-11 DIAGNOSIS — R197 Diarrhea, unspecified: Secondary | ICD-10-CM

## 2023-09-11 DIAGNOSIS — Z1211 Encounter for screening for malignant neoplasm of colon: Secondary | ICD-10-CM

## 2023-09-11 LAB — COMPREHENSIVE METABOLIC PANEL WITH GFR
ALT: 26 U/L (ref 0–53)
AST: 18 U/L (ref 0–37)
Albumin: 3.7 g/dL (ref 3.5–5.2)
Alkaline Phosphatase: 86 U/L (ref 39–117)
BUN: 19 mg/dL (ref 6–23)
CO2: 27 meq/L (ref 19–32)
Calcium: 9.2 mg/dL (ref 8.4–10.5)
Chloride: 105 meq/L (ref 96–112)
Creatinine, Ser: 1.38 mg/dL (ref 0.40–1.50)
GFR: 52.12 mL/min — ABNORMAL LOW (ref >60.00)
Glucose, Bld: 87 mg/dL (ref 70–99)
Potassium: 3.8 meq/L (ref 3.5–5.1)
Sodium: 138 meq/L (ref 135–145)
Total Bilirubin: 0.7 mg/dL (ref 0.2–1.2)
Total Protein: 7 g/dL (ref 6.0–8.3)

## 2023-09-11 LAB — CBC
HCT: 45.3 % (ref 39.0–52.0)
Hemoglobin: 15.2 g/dL (ref 13.0–17.0)
MCHC: 33.6 g/dL (ref 30.0–36.0)
MCV: 86.9 fl (ref 78.0–100.0)
Platelets: 202 K/uL (ref 150.0–400.0)
RBC: 5.22 Mil/uL (ref 4.22–5.81)
RDW: 14.6 % (ref 11.5–15.5)
WBC: 5.9 K/uL (ref 4.0–10.5)

## 2023-09-11 MED ORDER — TALICIA 250-12.5-10 MG PO CPDR
4.0000 | DELAYED_RELEASE_CAPSULE | Freq: Three times a day (TID) | ORAL | 0 refills | Status: AC
  Filled 2023-09-11: qty 168, 14d supply, fill #0

## 2023-09-11 MED ORDER — CHOLESTYRAMINE 4 G PO PACK
4.0000 g | PACK | Freq: Every day | ORAL | 2 refills | Status: AC
  Filled 2023-09-11 – 2023-09-22 (×2): qty 30, 30d supply, fill #0

## 2023-09-11 NOTE — Patient Instructions (Addendum)
 Your provider has requested that you go to the basement level for lab work before leaving today. Press B on the elevator. The lab is located at the first door on the left as you exit the elevator.  Your provider has ordered Diatherix stool testing for you. You have received a kit from our office today containing all necessary supplies to complete this test. Please carefully read the stool collection instructions provided in the kit before opening the accompanying materials. In addition, be sure there is a label providing your full name and date of birth on the puritan opti-swab tube that is supplied in the kit (if you do not see a label with this information on your test tube, please make us  aware before test collection!). After completing the test, you should secure the purtian tube into the specimen biohazard bag. The [facility] E-Req sheet (including date and time of specimen collection) should be placed into the outside pocket of the specimen biohazard bag and returned to the [place] lab (basement floor of [facility]) within 3 days of collection. Please make sure to give the specimen to a staff member at the lab. DO NOT leave the specimen on the counter.   If the specimen date and time (can be found in the upper right boxed portion of the sheet) are not filled out on the E-Req sheet, the test will NOT be performed.   You have been scheduled for an endoscopy. Please follow written instructions given to you at your visit today.  If you use inhalers (even only as needed), please bring them with you on the day of your procedure.  If you take any of the following medications, they will need to be adjusted prior to your procedure:   DO NOT TAKE 7 DAYS PRIOR TO TEST- Trulicity (dulaglutide) Ozempic , Wegovy  (semaglutide ) Mounjaro (tirzepatide) Bydureon Bcise (exanatide extended release)  DO NOT TAKE 1 DAY PRIOR TO YOUR TEST Rybelsus  (semaglutide ) Adlyxin  (lixisenatide) Victoza (liraglutide) Byetta (exanatide) ___________________________________________________________________________   We have sent the following medications to your pharmacy for you to pick up at your convenience: Cholestyramine , Talicia    _______________________________________________________  If your blood pressure at your visit was 140/90 or greater, please contact your primary care physician to follow up on this.  _______________________________________________________  If you are age 58 or older, your body mass index should be between 23-30. Your Body mass index is 31.58 kg/m. If this is out of the aforementioned range listed, please consider follow up with your Primary Care Provider.  If you are age 85 or younger, your body mass index should be between 19-25. Your Body mass index is 31.58 kg/m. If this is out of the aformentioned range listed, please consider follow up with your Primary Care Provider.   ________________________________________________________  The [facility] providers would like to encourage you to use MYCHART to communicate with providers for non-urgent requests or questions.  Due to long hold times on the telephone, sending your provider a message by [facility] may be a faster and more efficient way to get a response.  Please allow 48 business hours for a response.  Please remember that this is for non-urgent requests.  _______________________________________________________  [facility] is using a team-based approach to care.  Your team is made up of your doctor and two to three APPS. Our APPS (Nurse Practitioners and Physician Assistants) work with your physician to ensure care continuity for you. They are fully qualified to address your health concerns and develop  a treatment plan. They communicate directly with your gastroenterologist to care for you. Seeing the Advanced Practice Practitioners on your physician's team can help you by  facilitating care more promptly, often allowing for earlier appointments, access to diagnostic testing, procedures, and other specialty referrals.   Thank you for choosing me and [facility].  Dr. Wilhelmenia

## 2023-09-11 NOTE — Progress Notes (Signed)
 GASTROENTEROLOGY OUTPATIENT CLINIC VISIT   Primary Care Provider Chandra Toribio POUR, MD [address] [place] [address] [phone]  Patient Profile: Darius [name] is a 68 y.o. male with a pmh significant for  CHF, A-fib (on anticoagulation), diabetes, obesity, BPH, status postcholecystectomy (biliary stenting for obstruction), H. pylori (not treated yet).  The patient presents to the [facility] for an evaluation and management of problem(s) noted below:  Problem List 1. History of biliary duct stent placement   2. Biliary stricture   3. History of cholecystectomy   4. History of Helicobacter pylori infection   5. Diarrhea, unspecified type   6. Colon cancer screening    Discussed the use of AI scribe software for clinical note transcription with the patient, who gave verbal consent to proceed.  History of Present Illness Darius [name] is a 68 year old male who presents for follow-up after ERCP with biliary stenting and findings of H. pylori.  He was seen during his hospitalization for cholecystitis and abnormal LFTs with findings of possible Mirizzi which led to ERCP and biliary stenting.  He was found to have H. pylori.  He never picked up his H. pylori treatment.  Overall, doing OK.  He does, however, note changes in his bowel habits since his cholecystectomy.  Recurring issues of fecal urgency and looser bowel movements.  He has not been on bile acid sequestrant.  Last set of LFTs shows normalization of LFTs.  He is avoiding fatty foods to manage symptoms post-gallbladder removal.  No fever or other systemic symptoms.  No abdominal pain or discomfort currently.  No blood noted in his stools.  He has not had colon cancer screening but is interested in this.   GI Review of Systems Positive as above Negative for pyrosis, dysphagia, odynophagia, nausea, vomiting, melena, hematochezia  Review of Systems General: Denies fevers/chills/weight loss  unintentionally Cardiovascular: Denies chest pain Pulmonary: Denies shortness of breath Gastroenterological: See HPI Genitourinary: Denies darkened urine Hematological: Denies easy bruising/bleeding Dermatological: Denies jaundice Psychological: Mood is stable  Medications Current Outpatient Medications  Medication Sig Dispense Refill   acetaminophen  (TYLENOL ) 500 MG tablet Take 2 tablets (1,000 mg total) by mouth every 6 (six) hours as needed for mild pain (pain score 1-3) (or Fever >/= 101).     atorvastatin  (LIPITOR ) 80 MG tablet Take 1 tablet (80 mg total) by mouth every morning. 30 tablet 0   cholestyramine  (QUESTRAN ) 4 g packet Take 1 packet (4 g total) by mouth daily. 30 each 2   ezetimibe  (ZETIA ) 10 MG tablet Take 1 tablet (10 mg total) by mouth every morning. 30 tablet 0   insulin  glargine (LANTUS  SOLOSTAR) 100 UNIT/ML Solostar Pen Inject 20 Units into the skin every morning. 15 mL 3   metFORMIN  (GLUCOPHAGE ) 1000 MG tablet Take 1 tablet (1,000 mg total) by mouth 2 (two) times daily with a meal. 60 tablet 0   metoprolol  tartrate (LOPRESSOR ) 25 MG tablet Take 1 tablet (25 mg total) by mouth 2 (two) times daily. 60 tablet 0   pantoprazole  (PROTONIX ) 40 MG tablet Take 1 tablet (40 mg total) by mouth daily. 30 tablet 0   rivaroxaban  (XARELTO ) 20 MG TABS tablet Take 1 tablet (20 mg total) by mouth every morning. 30 tablet 0   Semaglutide ,0.25 or 0.5MG /DOS, 2 MG/3ML SOPN Inject 0.5 mg into the skin once a week. 3 mL 1   tamsulosin  (FLOMAX ) 0.4 MG CAPS capsule Take 1 capsule (0.4 mg total) by mouth daily after  supper. 90 capsule 0   traMADol  (ULTRAM ) 50 MG tablet Take 1 tablet (50 mg total) by mouth every 6 (six) hours as needed. 10 tablet 0   Amoxicill-Rifabutin -Omeprazole  (TALICIA ) 250-12.5-10 MG CPDR Take 4 capsules by mouth in the morning, at noon, and at bedtime. 168 capsule 0   No current facility-administered medications for this visit.    Allergies No Known  Allergies  Histories Past Medical History:  Diagnosis Date   Chronic combined systolic and diastolic heart failure (HCC)    followed by cardiologist;  a. Echo 03/27/11: EF 50-55%, inferior hypokinesis, moderate LAE, mild RVE, normal pulmonary pressures. ;   b.  TEE (04/03/11): EF 40-45%, mild MR moderate LAE, no LAA clot, mild RAE;  c.  Echocardiogram (02/2013): EF 60-65%, Gr 2 DD, mildly dilated Ao root (Ao root dimension 39 mm), MAC, mild LAE, normal RVF   Complication of anesthesia    pt unable to tolerate versed  and fentanyl  sedation and required general anesthesia with TEE 02/ 2013   Depression    Diabetes mellitus, type 2 (HCC)    followed by pcp  (02-21-2021 pt stated does not check blood sugar )   Hx of cardiovascular stress test    Lexiscan  Myoview (02/2013): No ischemia or scar, EF 51%, low risk;  cardiac cath 04-21-2016  no CAD, NICM   Malignant neoplasm of urinary bladder (HCC) 12/2020   urologist--- dr nieves;  oncologist-- dr amadeo,  high grade muscle invasion   Morbid obesity (HCC)    NICM (nonischemic cardiomyopathy) (HCC) 04/2016   followed by cardiology;   dx 03/ 2018 per TEE ef 15%;  TEE 04/ 2018 ef 25%;  TEE 07/ 2018 ef 40%;  echo 12/ 2021 ef 55%   OSA (obstructive sleep apnea)    followed by dr t. turner--  no cpap/ bipap:: previously followed  by dr clance sleep study 02-11-2013  very severe osa w/ AHI 108/hr had used cpap until 2017:   repeat sleep study 02-14-2018 in epic severe osa w/ hypoxemia, AHI 46.3/hr titrated 04-15-2018 used cpap for awhile   Persistent atrial fibrillation (HCC) 03/2011   cardiologist--   s/p TEE-DCCV (failed);  Amiodarone  started   Past Surgical History:  Procedure Laterality Date   BILIARY BRUSHING  07/06/2023   Procedure: BRUSH BIOPSY, BILE DUCT;  Surgeon: Wilhelmenia Aloha Raddle., MD;  Location: [facility] ENDOSCOPY;  Service: Gastroenterology;;   BILIARY STENT PLACEMENT  07/06/2023   Procedure: INSERTION, STENT, BILE DUCT;  Surgeon: Wilhelmenia Aloha Raddle., MD;  Location: [facility] ENDOSCOPY;  Service: Gastroenterology;;   BIOPSY OF SKIN SUBCUTANEOUS TISSUE AND/OR MUCOUS MEMBRANE  07/06/2023   Procedure: BIOPSY, SKIN, SUBCUTANEOUS TISSUE, OR MUCOUS MEMBRANE;  Surgeon: Wilhelmenia Aloha Raddle., MD;  Location: [facility] ENDOSCOPY;  Service: Gastroenterology;;   CARDIOVERSION  04/01/2011   Procedure: CARDIOVERSION;  Surgeon: Redell GORMAN Shallow, MD;  Location: [facility] ENDOSCOPY;  Service: Cardiovascular;  Laterality: N/A;   CARDIOVERSION  04/03/2011   Procedure: CARDIOVERSION;  Surgeon: Redell GORMAN Shallow, MD;  Location: [facility] ENDOSCOPY;  Service: Cardiovascular;  Laterality: N/A;   CARDIOVERSION  04/03/2011   Procedure: CARDIOVERSION;  Surgeon: Redell GORMAN Shallow, MD;  Location: [facility] OR;  Service: Cardiovascular;  Laterality: N/A;   CARDIOVERSION N/A 06/15/2014   Procedure: CARDIOVERSION;  Surgeon: Ezra GORMAN Shuck, MD;  Location: [facility] ENDOSCOPY;  Service: Cardiovascular;  Laterality: N/A;   CARDIOVERSION N/A 09/08/2016   Procedure: CARDIOVERSION;  Surgeon: Shuck Ezra GORMAN, MD;  Location: [facility] ENDOSCOPY;  Service: Cardiovascular;  Laterality: N/A;   CHOLECYSTECTOMY  N/A 07/07/2023   Procedure: LAPAROSCOPIC CHOLECYSTECTOMY;  Surgeon: Teresa Lonni HERO, MD;  Location: [facility] OR;  Service: General;  Laterality: N/A;  WITH ICG DYE   ERCP N/A 07/06/2023   Procedure: ERCP, WITH INTERVENTION IF INDICATED;  Surgeon: Wilhelmenia Aloha Raddle., MD;  Location: [facility] ENDOSCOPY;  Service: Gastroenterology;  Laterality: N/A;   I & D EXTREMITY Right 09/19/2015   Procedure: IRRIGATION AND DEBRIDEMENT EXTREMITY;  Surgeon: Lonni CINDERELLA Poli, MD;  Location: [facility] OR;  Service: Orthopedics;  Laterality: Right;   RIGHT/LEFT HEART CATH AND CORONARY ANGIOGRAPHY N/A 04/21/2016   Procedure: Right/Left Heart Cath and Coronary Angiography;  Surgeon: Ezra GORMAN Shuck, MD;  Location: [facility] INVASIVE CV LAB;  Service: Cardiovascular;  Laterality: N/A;   SPHINCTEROTOMY  07/06/2023   Procedure: SPHINCTEROTOMY, BILIARY;   Surgeon: Wilhelmenia Aloha Raddle., MD;  Location: [facility] ENDOSCOPY;  Service: Gastroenterology;;   TEE WITHOUT CARDIOVERSION  04/01/2011   Procedure: TRANSESOPHAGEAL ECHOCARDIOGRAM (TEE);  Surgeon: Redell GORMAN Shallow, MD;  Location: [facility] ENDOSCOPY;  Service: Cardiovascular;  Laterality: N/A;   TEE WITHOUT CARDIOVERSION  04/03/2011   Procedure: TRANSESOPHAGEAL ECHOCARDIOGRAM (TEE);  Surgeon: Redell GORMAN Shallow, MD;  Location: [facility] ENDOSCOPY;  Service: Cardiovascular;  Laterality: N/A;   TEE WITHOUT CARDIOVERSION  04/03/2011   Procedure: TRANSESOPHAGEAL ECHOCARDIOGRAM (TEE);  Surgeon: Redell GORMAN Shallow, MD;  Location: [facility] OR;  Service: Cardiovascular;  Laterality: N/A;   TEE WITHOUT CARDIOVERSION N/A 04/24/2016   Procedure: TRANSESOPHAGEAL ECHOCARDIOGRAM (TEE);  Surgeon: Ezra GORMAN Shuck, MD;  Location: [facility] ENDOSCOPY;  Service: Cardiovascular;  Laterality: N/A;   TEE WITHOUT CARDIOVERSION N/A 05/26/2016   Procedure: TRANSESOPHAGEAL ECHOCARDIOGRAM (TEE);  Surgeon: Ezra GORMAN Shuck, MD;  Location: [facility] ENDOSCOPY;  Service: Cardiovascular;  Laterality: N/A;   TEE WITHOUT CARDIOVERSION N/A 09/08/2016   Procedure: TRANSESOPHAGEAL ECHOCARDIOGRAM (TEE);  Surgeon: Shuck Ezra GORMAN, MD;  Location: [facility] ENDOSCOPY;  Service: Cardiovascular;  Laterality: N/A;   TRANSURETHRAL RESECTION OF BLADDER TUMOR N/A 12/14/2020   Procedure: TRANSURETHRAL RESECTION OF BLADDER TUMOR (TURBT) 2-5 cm/ POST OPERTIVE INSTILLATION OF GEMCITABINE ;  Surgeon: Nieves Cough, MD;  Location[facility];  Service: Urology;  Laterality: N/A;   TRANSURETHRAL RESECTION OF BLADDER TUMOR N/A 02/22/2021   Procedure: TRANSURETHRAL RESECTION OF BLADDER TUMOR (TURBT);  Surgeon: Nieves Cough, MD;  Location: [facility];  Service: Urology;  Laterality: N/A;   Social History   Socioeconomic History   Marital status: Single    Spouse name: Not on file   Number of children: 0   Years of education: 12   Highest education level: Not on  file  Occupational History   Occupation: unemployeed  Tobacco Use   Smoking status: Never   Smokeless tobacco: Never  Vaping Use   Vaping status: Never Used  Substance and Sexual Activity   Alcohol use: Yes    Comment: some   Drug use: Not Currently   Sexual activity: Not on file  Other Topics Concern   Not on file  Social History Narrative   Admits that he eats poorly and eats a lot at work.      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   Social Drivers of Health   Financial Resource Strain: High Risk (07/30/2020)   Overall Financial Resource Strain (CARDIA)    Difficulty of Paying Living Expenses: Hard  Food Insecurity: Food Insecurity Present (07/03/2023)   Hunger Vital Sign    Worried About Running Out of Food in the Last Year: Sometimes true    Ran Out  of Food in the Last Year: Sometimes true  Transportation Needs: No Transportation Needs (07/03/2023)   PRAPARE - Administrator, Civil Service (Medical): No    Lack of Transportation (Non-Medical): No  Physical Activity: Inactive (07/30/2020)   Exercise Vital Sign    Days of Exercise per Week: 0 days    Minutes of Exercise per Session: 0 min  Stress: Stress Concern Present (07/30/2020)   [employer] of Occupational Health - Occupational Stress Questionnaire    Feeling of Stress : To some extent  Social Connections: Socially Isolated (07/03/2023)   Social Connection and Isolation Panel    Frequency of Communication with Friends and Family: More than three times a week    Frequency of Social Gatherings with Friends and Family: Never    Attends Religious Services: Never    Database administrator or Organizations: No    Attends Banker Meetings: Never    Marital Status: Never married  Intimate Partner Violence: Not At Risk (07/03/2023)   Humiliation, Afraid, Rape, and Kick questionnaire    Fear of Current or Ex-Partner: No    Emotionally Abused: No    Physically Abused: No     Sexually Abused: No   Family History  Problem Relation Age of Onset   Seizures Mother        died in her 4s   Diabetes Father        died in his 7s   Hypertension Father    Hypertension Sister    Hypertension Paternal Grandfather    Heart attack Neg Hx    Stroke Neg Hx    Colon cancer Neg Hx    Esophageal cancer Neg Hx    Inflammatory bowel disease Neg Hx    Liver disease Neg Hx    Pancreatic cancer Neg Hx    Rectal cancer Neg Hx    Stomach cancer Neg Hx    I have reviewed his medical, social, and family history in detail and updated the electronic medical record as necessary.    PHYSICAL EXAMINATION  BP 106/64   Pulse 70   Ht 6' 2 (1.88 m)   Wt 246 lb (111.6 kg)   BMI 31.58 kg/m  Wt Readings from Last 3 Encounters:  09/11/23 246 lb (111.6 kg)  08/17/23 240 lb 8 oz (109.1 kg)  07/14/23 244 lb 6.4 oz (110.9 kg)  GEN: NAD, appears stated age, doesn't appear chronically ill PSYCH: Cooperative, without pressured speech EYE: Conjunctivae pink, sclerae anicteric ENT: MMM CV: Nontachycardic RESP: No audible wheezing GI: NABS, soft, protuberant abdomen, rounded, NT, without rebound or guarding MSK/EXT: No significant lower extremity edema SKIN: No jaundice NEURO:  Alert & Oriented x 3, no focal deficits   REVIEW OF DATA  I reviewed the following data at the time of this encounter:  GI Procedures and Studies  ERCP May 2025 - Granular gastric mucosa. Biopsied for HP evaluation. - Duodenitis. - The major papilla appeared to be enlarged and congested. - A single mild biliary narrowing was found in the common hepatic duct. 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 This was brushed for cytology. - The left main hepatic duct was mildly dilated. - Small amount of choledocholithiasis was found. Complete removal was accomplished by biliary sphincterotomy and balloon sweep. However, compared to MRCP, there was not amount of CDL as was  expected (query it is in cystic duct that is occluded at this time) as the Cystic duct did  not fill. - One plastic biliary stent was placed into the common bile duct into the left hepatic system.   Laboratory Studies  Reviewed those in epic  Imaging Studies  MRCP May 2025 IMPRESSION: 1. Cholelithiasis with gallbladder wall thickening and edema compatible with acute cholecystitis. 2. Multiple stones within the distal common bile duct (at least 5). The largest of these measures 5 mm. 3. Mild liver steatosis. 4. Small left adrenal myelolipoma. No follow-up imaging recommended. 5. Mild left-sided perinephric soft tissue stranding. Correlate for any clinical signs or symptoms of pyelonephritis.   ASSESSMENT  Mr. Marinos is a 68 y.o. male.  The patient is seen today for evaluation and management of:  1. History of biliary duct stent placement   2. Biliary stricture   3. History of cholecystectomy   4. History of Helicobacter pylori infection   5. Diarrhea, unspecified type   6. Colon cancer screening    The patient is hemodynamically and clinically stable at this time.  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  Hopefully he will not have further issues or need for biliary stenting but his LFT pattern is completely when last checked.  I suspect the patient is experiencing some postcholecystectomy diarrhea bile salt wise, and we will plan to initiate cholestyramine  in an effort to try to help the patient.  He needs H. pylori treatment and we will move forward with getting that treated.  Eventually this year he will need colon cancer screening, but will get him through his biliary issues first, though he does agree to colonoscopy later this year.  The risks of an ERCP were discussed at length, including but not limited to the risk of perforation, bleeding, abdominal pain, post-ERCP  pancreatitis (while usually mild can be severe and even life threatening).  The risks and benefits of endoscopic evaluation were discussed with the patient; these include but are not limited to the risk of perforation, infection, bleeding, missed lesions, lack of diagnosis, severe illness requiring hospitalization, as well as anesthesia and sedation related illnesses.  The patient and/or family is agreeable to proceed.  All patient questions were answered to the best of my ability, and the patient agrees to the aforementioned plan of action with follow-up as indicated.   PLAN  Laboratories as outlined below Proceed with scheduling EGD plus ERCP for biliary stent removal and hopeful final cleanout Cholestyramine  1 packet daily and titrate as necessary over the coming weeks Talicia  for H. pylori treatment H. pylori Diatherix 4 weeks post treatment Colonoscopy for screening later this year   Orders Placed This Encounter  Procedures   Procedural/ Surgical Case Request: ERCP, WITH INTERVENTION IF INDICATED, EGD (ESOPHAGOGASTRODUODENOSCOPY)   CBC   Comp Met (CMET)   Other/Misc lab test   Ambulatory referral to Gastroenterology    New Prescriptions   CHOLESTYRAMINE  (QUESTRAN ) 4 G PACKET    Take 1 packet (4 g total) by mouth daily.   Modified Medications   Modified Medication Previous Medication   AMOXICILL-RIFABUTIN -OMEPRAZOLE  (TALICIA ) 250-12.5-10 MG CPDR Amoxicill-Rifabutin -Omeprazole  (TALICIA ) 250-12.5-10 MG CPDR      Take 4 capsules by mouth in the morning, at noon, and at bedtime.    Take 4 capsules by mouth in the morning, at noon, and at bedtime.    Planned Follow Up No follow-ups on file.   Total Time in Face-to-Face and in Coordination of Care for patient including independent/personal interpretation/review of prior testing, medical  history, examination, medication adjustment, communicating results with the patient directly, and documentation within the EHR is 25  minutes.   Aloha Finner, MD [facility] Advanced Endoscopy Office # [phone]

## 2023-09-12 ENCOUNTER — Encounter: Payer: Self-pay | Admitting: Gastroenterology

## 2023-09-12 ENCOUNTER — Ambulatory Visit: Payer: Self-pay | Admitting: Gastroenterology

## 2023-09-12 DIAGNOSIS — Z8619 Personal history of other infectious and parasitic diseases: Secondary | ICD-10-CM | POA: Insufficient documentation

## 2023-09-12 DIAGNOSIS — R197 Diarrhea, unspecified: Secondary | ICD-10-CM | POA: Insufficient documentation

## 2023-09-12 DIAGNOSIS — Z1211 Encounter for screening for malignant neoplasm of colon: Secondary | ICD-10-CM | POA: Insufficient documentation

## 2023-09-12 DIAGNOSIS — Z9049 Acquired absence of other specified parts of digestive tract: Secondary | ICD-10-CM | POA: Insufficient documentation

## 2023-09-12 DIAGNOSIS — Z9889 Other specified postprocedural states: Secondary | ICD-10-CM | POA: Insufficient documentation

## 2023-09-14 ENCOUNTER — Telehealth: Payer: Self-pay | Admitting: Gastroenterology

## 2023-09-14 ENCOUNTER — Other Ambulatory Visit (HOSPITAL_COMMUNITY): Payer: Self-pay

## 2023-09-14 ENCOUNTER — Other Ambulatory Visit: Payer: Self-pay | Admitting: Family Medicine

## 2023-09-14 ENCOUNTER — Telehealth: Payer: Self-pay

## 2023-09-14 MED ORDER — OZEMPIC (0.25 OR 0.5 MG/DOSE) 2 MG/3ML ~~LOC~~ SOPN
0.5000 mg | PEN_INJECTOR | SUBCUTANEOUS | 1 refills | Status: DC
  Filled 2023-09-14: qty 3, 28d supply, fill #0
  Filled 2023-10-13 (×2): qty 3, 28d supply, fill #1

## 2023-09-14 NOTE — Telephone Encounter (Signed)
 No answer no voice mail

## 2023-09-14 NOTE — Telephone Encounter (Signed)
 Patient is calling stating that he is taking Amoxicill-Rifabutin -Omeprazole  250- 12.5-10 MG. Patient stated that he is throwing up his medication shortly after taking them and is unsure what he needs to do. Patient is requesting a call back .Please advise.

## 2023-09-14 NOTE — Telephone Encounter (Signed)
 Request for surgical clearance:     Endoscopy Procedure  What type of surgery is being performed?     Colonoscopy   When is this surgery scheduled?     11/09/23  What type of clearance is required ?   Pharmacy  Are there any medications that need to be held prior to surgery and how long? Xarelto  x2 days prior to procedure.   Practice name and name of physician performing surgery?      [facility]  What is your office phone and fax number?      Phone- [phone]  Fax- [phone]  Anesthesia type (None, local, MAC, general) ?       MAC  Please route your response to Blondie Barks, CMA

## 2023-09-15 NOTE — Telephone Encounter (Signed)
 Left message on machine to call back

## 2023-09-16 NOTE — Telephone Encounter (Signed)
I have been unable to reach pt will await further communication from the pt

## 2023-09-18 ENCOUNTER — Other Ambulatory Visit: Payer: Self-pay

## 2023-09-18 ENCOUNTER — Other Ambulatory Visit: Payer: Self-pay | Admitting: Family Medicine

## 2023-09-18 DIAGNOSIS — E119 Type 2 diabetes mellitus without complications: Secondary | ICD-10-CM

## 2023-09-22 ENCOUNTER — Telehealth: Payer: Self-pay

## 2023-09-22 ENCOUNTER — Other Ambulatory Visit (HOSPITAL_COMMUNITY): Payer: Self-pay

## 2023-09-22 ENCOUNTER — Other Ambulatory Visit: Payer: Self-pay

## 2023-09-22 MED ORDER — PANTOPRAZOLE SODIUM 40 MG PO TBEC
40.0000 mg | DELAYED_RELEASE_TABLET | Freq: Every day | ORAL | 11 refills | Status: AC
  Filled 2023-09-22: qty 30, 30d supply, fill #0
  Filled 2023-10-22: qty 30, 30d supply, fill #1
  Filled 2023-11-19: qty 30, 30d supply, fill #2
  Filled 2023-12-21: qty 30, 30d supply, fill #3
  Filled 2024-01-19: qty 30, 30d supply, fill #4

## 2023-09-22 MED ORDER — RIVAROXABAN 20 MG PO TABS
20.0000 mg | ORAL_TABLET | Freq: Every morning | ORAL | 11 refills | Status: AC
  Filled 2023-09-22: qty 30, 30d supply, fill #0
  Filled 2023-10-22: qty 30, 30d supply, fill #1
  Filled 2023-11-19: qty 30, 30d supply, fill #2
  Filled 2023-12-21: qty 30, 30d supply, fill #3
  Filled 2024-01-19: qty 30, 30d supply, fill #4

## 2023-09-22 MED ORDER — METFORMIN HCL 1000 MG PO TABS
1000.0000 mg | ORAL_TABLET | Freq: Two times a day (BID) | ORAL | 11 refills | Status: AC
  Filled 2023-09-22: qty 60, 30d supply, fill #0
  Filled 2023-10-22: qty 60, 30d supply, fill #1
  Filled 2023-11-19: qty 60, 30d supply, fill #2
  Filled 2023-12-21: qty 60, 30d supply, fill #3
  Filled 2024-01-19: qty 60, 30d supply, fill #4

## 2023-09-22 MED ORDER — EZETIMIBE 10 MG PO TABS
10.0000 mg | ORAL_TABLET | Freq: Every morning | ORAL | 11 refills | Status: AC
  Filled 2023-09-22: qty 30, 30d supply, fill #0
  Filled 2023-10-22: qty 30, 30d supply, fill #1
  Filled 2023-11-19: qty 30, 30d supply, fill #2
  Filled 2023-12-21: qty 30, 30d supply, fill #3
  Filled 2024-01-19: qty 30, 30d supply, fill #4

## 2023-09-22 MED ORDER — METOPROLOL TARTRATE 25 MG PO TABS
25.0000 mg | ORAL_TABLET | Freq: Two times a day (BID) | ORAL | 11 refills | Status: AC
  Filled 2023-09-22: qty 60, 30d supply, fill #0
  Filled 2023-10-22: qty 60, 30d supply, fill #1
  Filled 2023-11-19: qty 60, 30d supply, fill #2
  Filled 2023-12-21: qty 60, 30d supply, fill #3
  Filled 2024-01-19: qty 60, 30d supply, fill #4

## 2023-09-22 MED ORDER — ATORVASTATIN CALCIUM 80 MG PO TABS
80.0000 mg | ORAL_TABLET | Freq: Every morning | ORAL | 11 refills | Status: AC
  Filled 2023-09-22: qty 30, 30d supply, fill #0
  Filled 2023-10-22: qty 30, 30d supply, fill #1
  Filled 2023-11-19: qty 30, 30d supply, fill #2
  Filled 2023-12-21: qty 30, 30d supply, fill #3
  Filled 2024-01-19: qty 30, 30d supply, fill #4

## 2023-09-22 NOTE — Telephone Encounter (Signed)
 Copied from CRM [id number]. Topic: Clinical - Medication Question >> Sep 22, 2023 12:04 PM Jasmin G wrote: Reason for CRM: Mr. Norleen from [place] [facility] called due to encountering s discrepancy in dosage of all the pt's medications for refill. Please call back at [phone] or [phone].

## 2023-09-22 NOTE — Telephone Encounter (Signed)
 Called [facility] and they were not sure what the discrepancy was.  Stated they would reach out to Dylan [phone]) regarding further clarification regarding prescriptions.

## 2023-09-24 ENCOUNTER — Other Ambulatory Visit: Payer: Self-pay

## 2023-09-24 NOTE — Telephone Encounter (Signed)
 Patient with diagnosis of atrial fibrillation on Xarelto  for anticoagulation.    What type of surgery is being performed?     Colonoscopy    When is this surgery scheduled?     11/09/23   CHA2DS2-VASc Score = 3   This indicates a 3.2% annual risk of stroke. The patient's score is based upon: CHF History: 1 HTN History: 0 Diabetes History: 1 Stroke History: 0 Vascular Disease History: 0 Age Score: 1 Gender Score: 0   CrCl 80 Platelet count 202  Patient has not had an Afib/aflutter ablation within the last 3 months or DCCV within the last 30 days  Per office protocol, patient can hold Xareto for 2 days prior to procedure.    Patient will not need bridging with Lovenox  (enoxaparin ) around procedure.  **This guidance is not considered finalized until pre-operative APP has relayed final recommendations.**

## 2023-09-24 NOTE — Telephone Encounter (Signed)
   Patient Name: Darius [name]  DOB: [birth year] MRN: [id number]  Primary Cardiologist: Ezra Shuck, MD  Clinical pharmacists have reviewed the patient's past medical history, labs, and current medications as part of preoperative protocol coverage. The following recommendations have been made:  Patient with diagnosis of atrial fibrillation on Xarelto  for anticoagulation.     What type of surgery is being performed?     Colonoscopy    When is this surgery scheduled?     11/09/23     CHA2DS2-VASc Score = 3  This indicates a 3.2% annual risk of stroke. The patient's score is based upon: CHF History: 1 HTN History: 0 Diabetes History: 1 Stroke History: 0 Vascular Disease History: 0 Age Score: 1 Gender Score: 0   CrCl 80 Platelet count 202   Patient has not had an Afib/aflutter ablation within the last 3 months or DCCV within the last 30 days   Per office protocol, patient can hold Xareto for 2 days prior to procedure.  Please resume Xarelto  as soon as possible postprocedure, at the discretion of the surgeon.   Patient will not need bridging with Lovenox  (enoxaparin ) around procedure.   I will route this recommendation to the requesting party via Epic fax function and remove from pre-op pool.  Please call with questions.  Damien JAYSON Braver, NP 09/24/2023, 12:44 PM

## 2023-09-28 NOTE — Telephone Encounter (Signed)
 Per office protocol( cardiology), patient can hold Xareto for 2 days prior to procedure.  Please resume Xarelto  as soon as possible postprocedure, at the discretion of the surgeon.   Patient has been informed and voiced understanding.

## 2023-10-02 ENCOUNTER — Other Ambulatory Visit (HOSPITAL_COMMUNITY): Payer: Self-pay

## 2023-10-08 ENCOUNTER — Other Ambulatory Visit: Payer: Self-pay

## 2023-10-13 ENCOUNTER — Other Ambulatory Visit (HOSPITAL_COMMUNITY): Payer: Self-pay

## 2023-10-13 ENCOUNTER — Telehealth: Payer: Self-pay | Admitting: Family Medicine

## 2023-10-13 ENCOUNTER — Other Ambulatory Visit: Payer: Self-pay

## 2023-10-13 MED ORDER — INSULIN PEN NEEDLE 32G X 4 MM MISC
100.0000 | Freq: Three times a day (TID) | 2 refills | Status: AC
  Filled 2023-10-13 – 2023-10-16 (×2): qty 100, 30d supply, fill #0
  Filled 2023-12-08 – 2024-02-25 (×2): qty 100, 30d supply, fill #1

## 2023-10-13 NOTE — Telephone Encounter (Signed)
 pen needles 32G 4mm 100 count. Insupen PIC Solution not on current list.

## 2023-10-13 NOTE — Telephone Encounter (Signed)
 Copied from CRM [id number]. Topic: Clinical - Medication Refill >> Oct 13, 2023  9:55 AM Rosaria E wrote: Medication: Needs more pen needles 32G 4mm 100 count. Insupen PIC Solution   Has the patient contacted their pharmacy? Yes (Agent: If no, request that the patient contact the pharmacy for the refill. If patient does not wish to contact the pharmacy document the reason why and proceed with request.) (Agent: If yes, when and what did the pharmacy advise?)  This is the patient's preferred pharmacy:   [place] [facility] [address] [place] [address] Phone: [phone] Fax: [phone]  Is this the correct pharmacy for this prescription? Yes If no, delete pharmacy and type the correct one.   Has the prescription been filled recently? Yes  Is the patient out of the medication? Yes  Has the patient been seen for an appointment in the last year OR does the patient have an upcoming appointment? Yes  Can we respond through MyChart? Yes  Agent: Please be advised that Rx refills may take up to 3 business days. We ask that you follow-up with your pharmacy.

## 2023-10-14 ENCOUNTER — Ambulatory Visit: Payer: Self-pay | Admitting: Family Medicine

## 2023-10-14 NOTE — Telephone Encounter (Signed)
  Pt is requesting medication not listed on medication list: see below     Copied from CRM #[id number]. Topic: Clinical - Medication Refill >> Oct 13, 2023  9:55 AM Rosaria E wrote: Medication: Needs more pen needles 32G 4mm 100 count. Insupen PIC Solution    Has the patient contacted their pharmacy? Yes (Agent: If no, request that the patient contact the pharmacy for the refill. If patient does not wish to contact the pharmacy document the reason why and proceed with request.) (Agent: If yes, when and what did the pharmacy advise?)   This is the patient's preferred pharmacy:     - [facility] [address] [place] [address] Phone: [phone] Fax: [phone]   Is this the correct pharmacy for this prescription? Yes If no, delete pharmacy and type the correct one.    Has the prescription been filled recently? Yes   Is the patient out of the medication? Yes   Has the patient been seen for an appointment in the last year OR does the patient have an upcoming appointment? Yes   Can we respond through MyChart? Yes   Agent: Please be advised that Rx refills may take up to 3 business days. We ask that you follow-up with your pharmacy.

## 2023-10-14 NOTE — Telephone Encounter (Signed)
 LVM for pt informing him that the [facility] [address] and if he has any questions he can call us  back.

## 2023-10-14 NOTE — Telephone Encounter (Signed)
 Pt is requesting medication not listed on medication list: see below   Copied from CRM #[id number]. Topic: Clinical - Medication Refill >> Oct 13, 2023  9:55 AM Rosaria E wrote: Medication: Needs more pen needles 32G 4mm 100 count. Insupen PIC Solution   Has the patient contacted their pharmacy? Yes (Agent: If no, request that the patient contact the pharmacy for the refill. If patient does not wish to contact the pharmacy document the reason why and proceed with request.) (Agent: If yes, when and what did the pharmacy advise?)  This is the patient's preferred pharmacy:   [place] [facility] [address] [place] [address] Phone: [phone] Fax: [phone]  Is this the correct pharmacy for this prescription? Yes If no, delete pharmacy and type the correct one.   Has the prescription been filled recently? Yes  Is the patient out of the medication? Yes  Has the patient been seen for an appointment in the last year OR does the patient have an upcoming appointment? Yes  Can we respond through MyChart? Yes  Agent: Please be advised that Rx refills may take up to 3 business days. We ask that you follow-up with your pharmacy.

## 2023-10-14 NOTE — Telephone Encounter (Signed)
 LVM informing pt that [facility] on [address]

## 2023-10-14 NOTE — Telephone Encounter (Signed)
 Copied from CRM [id number]. Topic: Clinical - Medication Refill >> Oct 14, 2023 10:06 AM Larissa S wrote: Patient calling to check the status of refill. Advised it can take 3 business days. Reviewed prescription and it does not state received by pharmacy at this time.

## 2023-10-15 ENCOUNTER — Other Ambulatory Visit (HOSPITAL_COMMUNITY): Payer: Self-pay

## 2023-10-15 ENCOUNTER — Other Ambulatory Visit: Payer: Self-pay

## 2023-10-15 ENCOUNTER — Other Ambulatory Visit (HOSPITAL_BASED_OUTPATIENT_CLINIC_OR_DEPARTMENT_OTHER): Payer: Self-pay

## 2023-10-16 ENCOUNTER — Other Ambulatory Visit (HOSPITAL_COMMUNITY): Payer: Self-pay

## 2023-10-19 ENCOUNTER — Other Ambulatory Visit (HOSPITAL_COMMUNITY): Payer: Self-pay

## 2023-10-19 ENCOUNTER — Other Ambulatory Visit: Payer: Self-pay

## 2023-10-22 ENCOUNTER — Other Ambulatory Visit: Payer: Self-pay

## 2023-10-23 ENCOUNTER — Other Ambulatory Visit: Payer: Self-pay

## 2023-11-02 ENCOUNTER — Encounter (HOSPITAL_COMMUNITY): Payer: Self-pay | Admitting: Gastroenterology

## 2023-11-02 ENCOUNTER — Telehealth: Payer: Self-pay | Admitting: Gastroenterology

## 2023-11-02 NOTE — Progress Notes (Signed)
 Attempted to obtain medical history for pre op call via telephone, unable to reach at this time. HIPAA compliant voicemail message left requesting return call to pre surgical testing department.

## 2023-11-02 NOTE — Telephone Encounter (Addendum)
 Procedure:ERCP/EGD Procedure date: 11/09/23 Procedure location: [facility] Arrival Time: 630 am Spoke with the patient Y/N:   No, Called [phone] and the phone never rang but the mailbox was full and couldn't leave a message. Mychart message sent No, I left a detailed message on [phone] on 11/03/23 @ 9:22 am for the patient to return call    Any prep concerns? ___  Has the patient obtained the prep from the pharmacy ? No prep needed Do you have a care partner and transportation: ___ Any additional concerns? ___

## 2023-11-03 NOTE — Telephone Encounter (Signed)
 Have not received a response from the patient.

## 2023-11-03 NOTE — Telephone Encounter (Signed)
 Noted the pt was mailed all information and sent to My Chart

## 2023-11-06 NOTE — Telephone Encounter (Signed)
 The pt has been advised that he can have his care partner check in and leave then pick him back when he is ready for discharge.

## 2023-11-06 NOTE — Telephone Encounter (Signed)
 Inbound call from patient stating he has a care partner for his upcoming procedure on Monday 11/09/23 but they are unable to stay the full time of the procedure but will be able to come and pick them up. Patient is wanting to speak to nurse in regards to appointment Requesting a call back  Please advise  Thank you

## 2023-11-08 NOTE — Anesthesia Preprocedure Evaluation (Addendum)
 Anesthesia Evaluation  Patient identified by MRN, date of birth, ID band Patient awake    Reviewed: Allergy & Precautions, NPO status , Patient's Chart, lab work & pertinent test results  History of Anesthesia Complications (+) history of anesthetic complications  Airway Mallampati: III  TM Distance: <3 FB Neck ROM: Full  Mouth opening: Limited Mouth Opening  Dental no notable dental hx. (+) Poor Dentition, Missing, Chipped, Dental Advisory Given   Pulmonary sleep apnea and Continuous Positive Airway Pressure Ventilation    Pulmonary exam normal breath sounds clear to auscultation       Cardiovascular Normal cardiovascular exam+ dysrhythmias Atrial Fibrillation  Rhythm:Regular Rate:Normal  06/2023 Echo  1. Left ventricular ejection fraction, by estimation, is 50 to 55%. The  left ventricle has low normal function. The left ventricle has no regional  wall motion abnormalities. There is mild left ventricular hypertrophy.  Left ventricular diastolic  parameters are indeterminate.   2. Right ventricular systolic function is mildly reduced. The right  ventricular size is mildly enlarged. Tricuspid regurgitation signal is  inadequate for assessing PA pressure.   3. Left atrial size was mildly dilated.   4. Right atrial size was mildly dilated.   5. The mitral valve is grossly normal. Trivial mitral valve  regurgitation. No evidence of mitral stenosis.   6. The aortic valve is tricuspid. There is mild calcification of the  aortic valve. Aortic valve regurgitation is not visualized. Aortic valve  sclerosis/calcification is present, without any evidence of aortic  stenosis.   7. The inferior vena cava is dilated in size with <50% respiratory  variability, suggesting right atrial pressure of 15 mmHg.      Neuro/Psych    Depression       GI/Hepatic   Endo/Other  diabetes, Type 2    Renal/GU Renal diseaseLab Results       Component                Value               Date                          K                        3.8                 09/11/2023                                   Musculoskeletal   Abdominal   Peds  Hematology negative hematology ROS (+) Lab Results      Component                Value               Date                               HGB                      15.2                09/11/2023                HCT  45.3                09/11/2023                          PLT                      202.0               09/11/2023              Anesthesia Other Findings   Reproductive/Obstetrics                              Anesthesia Physical Anesthesia Plan  ASA: 3  Anesthesia Plan: General   Post-op Pain Management: Minimal or no pain anticipated   Induction: Intravenous  PONV Risk Score and Plan: 3 and Propofol  infusion, Treatment may vary due to age or medical condition and Ondansetron   Airway Management Planned: Oral ETT  Additional Equipment: None  Intra-op Plan:   Post-operative Plan: Extubation in OR  Informed Consent: I have reviewed the patients History and Physical, chart, labs and discussed the procedure including the risks, benefits and alternatives for the proposed anesthesia with the patient or authorized representative who has indicated his/her understanding and acceptance.     Dental advisory given  Plan Discussed with: CRNA and Surgeon  Anesthesia Plan Comments: (Biliary Stricture, Hx of Biliary stent)         Anesthesia Quick Evaluation

## 2023-11-09 ENCOUNTER — Other Ambulatory Visit: Payer: Self-pay

## 2023-11-09 ENCOUNTER — Ambulatory Visit (HOSPITAL_COMMUNITY): Payer: Self-pay | Admitting: Anesthesiology

## 2023-11-09 ENCOUNTER — Ambulatory Visit (HOSPITAL_BASED_OUTPATIENT_CLINIC_OR_DEPARTMENT_OTHER): Payer: Self-pay | Admitting: Anesthesiology

## 2023-11-09 ENCOUNTER — Encounter (HOSPITAL_COMMUNITY)
Admission: RE | Disposition: A | Discharge status: Home / Self Care | Payer: Self-pay | Source: Home / Self Care | Attending: Gastroenterology

## 2023-11-09 ENCOUNTER — Other Ambulatory Visit (HOSPITAL_COMMUNITY): Payer: Self-pay

## 2023-11-09 ENCOUNTER — Ambulatory Visit (HOSPITAL_COMMUNITY)

## 2023-11-09 ENCOUNTER — Encounter (HOSPITAL_COMMUNITY): Payer: Self-pay | Admitting: Gastroenterology

## 2023-11-09 ENCOUNTER — Ambulatory Visit (HOSPITAL_COMMUNITY)
Admission: RE | Admit: 2023-11-09 | Discharge: 2023-11-09 | Disposition: A | Discharge status: Home / Self Care | Attending: Gastroenterology | Admitting: Gastroenterology

## 2023-11-09 DIAGNOSIS — R197 Diarrhea, unspecified: Secondary | ICD-10-CM

## 2023-11-09 DIAGNOSIS — Z9689 Presence of other specified functional implants: Secondary | ICD-10-CM | POA: Diagnosis not present

## 2023-11-09 DIAGNOSIS — K838 Other specified diseases of biliary tract: Secondary | ICD-10-CM

## 2023-11-09 DIAGNOSIS — F32A Depression, unspecified: Secondary | ICD-10-CM | POA: Insufficient documentation

## 2023-11-09 DIAGNOSIS — I428 Other cardiomyopathies: Secondary | ICD-10-CM | POA: Insufficient documentation

## 2023-11-09 DIAGNOSIS — I4819 Other persistent atrial fibrillation: Secondary | ICD-10-CM | POA: Diagnosis not present

## 2023-11-09 DIAGNOSIS — K2960 Other gastritis without bleeding: Secondary | ICD-10-CM | POA: Insufficient documentation

## 2023-11-09 DIAGNOSIS — I4891 Unspecified atrial fibrillation: Secondary | ICD-10-CM | POA: Diagnosis not present

## 2023-11-09 DIAGNOSIS — K2970 Gastritis, unspecified, without bleeding: Secondary | ICD-10-CM

## 2023-11-09 DIAGNOSIS — K8050 Calculus of bile duct without cholangitis or cholecystitis without obstruction: Secondary | ICD-10-CM

## 2023-11-09 DIAGNOSIS — Z6831 Body mass index (BMI) 31.0-31.9, adult: Secondary | ICD-10-CM | POA: Insufficient documentation

## 2023-11-09 DIAGNOSIS — Z9889 Other specified postprocedural states: Secondary | ICD-10-CM

## 2023-11-09 DIAGNOSIS — Z4659 Encounter for fitting and adjustment of other gastrointestinal appliance and device: Secondary | ICD-10-CM

## 2023-11-09 DIAGNOSIS — K2950 Unspecified chronic gastritis without bleeding: Secondary | ICD-10-CM | POA: Diagnosis not present

## 2023-11-09 DIAGNOSIS — Z833 Family history of diabetes mellitus: Secondary | ICD-10-CM | POA: Insufficient documentation

## 2023-11-09 DIAGNOSIS — I5042 Chronic combined systolic (congestive) and diastolic (congestive) heart failure: Secondary | ICD-10-CM | POA: Diagnosis not present

## 2023-11-09 DIAGNOSIS — G4733 Obstructive sleep apnea (adult) (pediatric): Secondary | ICD-10-CM | POA: Diagnosis not present

## 2023-11-09 DIAGNOSIS — K766 Portal hypertension: Secondary | ICD-10-CM | POA: Diagnosis not present

## 2023-11-09 DIAGNOSIS — E119 Type 2 diabetes mellitus without complications: Secondary | ICD-10-CM | POA: Insufficient documentation

## 2023-11-09 DIAGNOSIS — Z9049 Acquired absence of other specified parts of digestive tract: Secondary | ICD-10-CM

## 2023-11-09 DIAGNOSIS — G4730 Sleep apnea, unspecified: Secondary | ICD-10-CM | POA: Diagnosis not present

## 2023-11-09 DIAGNOSIS — K2289 Other specified disease of esophagus: Secondary | ICD-10-CM

## 2023-11-09 DIAGNOSIS — Z8619 Personal history of other infectious and parasitic diseases: Secondary | ICD-10-CM

## 2023-11-09 DIAGNOSIS — K831 Obstruction of bile duct: Secondary | ICD-10-CM

## 2023-11-09 DIAGNOSIS — I110 Hypertensive heart disease with heart failure: Secondary | ICD-10-CM | POA: Insufficient documentation

## 2023-11-09 HISTORY — PX: ERCP: SHX5425

## 2023-11-09 HISTORY — PX: ESOPHAGOGASTRODUODENOSCOPY: SHX5428

## 2023-11-09 LAB — GLUCOSE, CAPILLARY: Glucose-Capillary: 109 mg/dL — ABNORMAL HIGH (ref 70–99)

## 2023-11-09 SURGERY — ERCP, WITH INTERVENTION IF INDICATED
Anesthesia: General

## 2023-11-09 MED ORDER — SODIUM CHLORIDE 0.9 % IV SOLN: INTRAVENOUS | Status: DC

## 2023-11-09 MED ORDER — FENTANYL CITRATE (PF) 100 MCG/2ML IJ SOLN
INTRAMUSCULAR | Status: DC | PRN
  Administered 2023-11-09: 50 ug via INTRAVENOUS

## 2023-11-09 MED ORDER — LACTATED RINGERS IV SOLN
INTRAVENOUS | Status: AC | PRN
Start: 2023-11-09 — End: 2023-11-09
  Administered 2023-11-09: 1000 mL via INTRAVENOUS

## 2023-11-09 MED ORDER — FENTANYL CITRATE (PF) 100 MCG/2ML IJ SOLN
INTRAMUSCULAR | Status: AC
  Filled 2023-11-09: qty 2

## 2023-11-09 MED ORDER — SODIUM CHLORIDE 0.9 % IV SOLN
1.5000 g | Freq: Once | INTRAVENOUS | Status: AC
  Administered 2023-11-09: 1.5 g via INTRAVENOUS
  Filled 2023-11-09: qty 4

## 2023-11-09 MED ORDER — DICLOFENAC SUPPOSITORY 100 MG
RECTAL | Status: DC | PRN
Start: 2023-11-09 — End: 2023-11-09
  Administered 2023-11-09: 100 mg via RECTAL

## 2023-11-09 MED ORDER — ONDANSETRON HCL 4 MG/2ML IJ SOLN
INTRAMUSCULAR | Status: DC | PRN
  Administered 2023-11-09: 4 mg via INTRAVENOUS

## 2023-11-09 MED ORDER — PROPOFOL 10 MG/ML IV BOLUS
INTRAVENOUS | Status: DC | PRN
  Administered 2023-11-09: 200 mg via INTRAVENOUS

## 2023-11-09 MED ORDER — CIPROFLOXACIN HCL 500 MG PO TABS
500.0000 mg | ORAL_TABLET | Freq: Two times a day (BID) | ORAL | 0 refills | Status: AC
Start: 2023-11-09 — End: 2023-11-12
  Filled 2023-11-09 (×2): qty 6, 3d supply, fill #0

## 2023-11-09 MED ORDER — SUGAMMADEX SODIUM 200 MG/2ML IV SOLN
INTRAVENOUS | Status: DC | PRN
  Administered 2023-11-09: 200 mg via INTRAVENOUS

## 2023-11-09 MED ORDER — DICLOFENAC SUPPOSITORY 100 MG
RECTAL | Status: AC
  Filled 2023-11-09: qty 1

## 2023-11-09 MED ORDER — ROCURONIUM BROMIDE 100 MG/10ML IV SOLN
INTRAVENOUS | Status: DC | PRN
  Administered 2023-11-09: 60 mg via INTRAVENOUS

## 2023-11-09 MED ORDER — SODIUM CHLORIDE 0.9 % IV SOLN
INTRAVENOUS | Status: DC | PRN
  Administered 2023-11-09: 60 mL

## 2023-11-09 MED ORDER — GLUCAGON HCL RDNA (DIAGNOSTIC) 1 MG IJ SOLR
INTRAMUSCULAR | Status: AC
  Filled 2023-11-09: qty 1

## 2023-11-09 MED ORDER — LIDOCAINE HCL (CARDIAC) PF 100 MG/5ML IV SOSY
PREFILLED_SYRINGE | INTRAVENOUS | Status: DC | PRN
  Administered 2023-11-09: 60 mg via INTRAVENOUS

## 2023-11-09 NOTE — Discharge Instructions (Signed)

## 2023-11-09 NOTE — Op Note (Addendum)
 [facility] Patient Name: Darius [name] Procedure Date: 11/09/2023 MRN: [id number] Attending MD: Aloha Finner , MD, [id number] Date of Birth: 1955/08/22 CSN: [id number] Age: 68 Admit Type: Inpatient Procedure:                ERCP Indications:              Common bile duct stone(s), Biliary stent removal,                            Follow-up of Helicobacter pylori, Portal                            hypertension rule out esophageal varices Providers:                Aloha Finner, MD, Olam Riedel, RN, [facility]                            [place], Technician, Carliss Martin,CRNA Referring MD:              Medicines:                General Anesthesia, Unasyn 1.5 g IV, Diclofenac  100                            mg rectal Complications:            No immediate complications. Estimated Blood Loss:     Estimated blood loss was minimal. Procedure:                Pre-Anesthesia Assessment:                           - Prior to the procedure, a History and Physical                            was performed, and patient medications and                            allergies were reviewed. The patient's tolerance of                            previous anesthesia was also reviewed. The risks                            and benefits of the procedure and the sedation                            options and risks were discussed with the patient.                            All questions were answered, and informed consent                            was obtained. Prior Anticoagulants: The patient has  taken no anticoagulant or antiplatelet agents. ASA                            Grade Assessment: III - A patient with severe                            systemic disease. After reviewing the risks and                            benefits, the patient was deemed in satisfactory                            condition to undergo the procedure.                            After obtaining informed consent, the scope was                            passed under direct vision. Throughout the                            procedure, the patient's blood pressure, pulse, and                            oxygen saturations were monitored continuously. The                            [id number]-H190 ([id number]) Olympus endoscope was introduced                            through the mouth, and used to inject contrast into                            and used to locate the major papilla. The [id number]-Q190V                            ([id number]) Olympus duodenoscope was introduced                            through the mouth, and used to inject contrast into                            and used to inject contrast into the bile duct. The                            ERCP was accomplished without difficulty. The                            patient tolerated the procedure. Scope In: Scope Out: Findings:      A scout film of the abdomen was obtained. Stent(s) and surgical clips       consistent with a previous cholecystectomy were seen. A standard       esophagogastroduodenoscopy scope was used for the examination  of the       upper gastrointestinal tract.      The scope was passed under direct vision through the upper GI tract. No       gross lesions were noted in the entire esophagus no evidence of varices       were noted. The Z-line was irregular and was found 44 cm from the       incisors. Patchy mild inflammation characterized by erosions, friability       and granularity was found in the entire examined stomach - biopsied for       HP evaluation in setting of previous treatment and continued findings of       erosions. No gross lesions were noted in the duodenal bulb, in the first       portion of the duodenum and in the second portion of the duodenum. A       biliary sphincterotomy had been performed. The sphincterotomy appeared       open. One plastic biliary stent originating in the  biliary tree was       emerging from the major papilla. The stent was visibly patent. One stent       was removed from the biliary tree using a snare.      A 0.035 inch x 260 cm straight Hydra Jagwire was passed into the biliary       tree. The short-nosed traction sphincterotome was passed over the       guidewire and the bile duct was then deeply cannulated. Contrast was       injected. I personally interpreted the bile duct images. Ductal flow of       contrast was adequate. Image quality was adequate. Contrast extended to       the hepatic ducts. Opacification of the entire biliary tree except for       the cystic duct and gallbladder was successful. The lower third of the       main bile duct contained filling defect thought to be sludge. The main       bile duct was mildly dilated. The largest diameter was 8-10 mm. The left       main hepatic duct contained a localized irregularity not clear if this       was angulation vs stricturing initially. To discover objects, the       biliary tree was swept with a retrieval balloon. Sludge was swept from       the duct. Small amount of sludge was swept from the duct. An occlusion       cholangiogram was performed that showed no further significant biliary       pathology within the main duct and then the previous left main hepatic       duct irregularity was less conspicous when pressure contrast injection       had been done. However, I remained concerned about whether there could       be something more in this area. Decision made to perform cholangioscopy       to ensure no evidence of other abnormality. The bile duct was explored       endoscopically using the SpyGlass direct visualization system. The       SpyScope was advanced to the bifurcation. Visibility with the scope was       good. The lumen of the left main hepatic duct was not narrowed but       rather  angulated. The entire biliary tree was otherwise normal. If was       felt  that the cystic duct may be coming off the left main hepatic duct       as variation anatomy.      A pancreatogram was not performed.      The duodenoscope was withdrawn from the patient. Impression:               - No gross lesions in the entire esophagus (no                            varices). Z-line irregular, 44 cm from the incisors.                           - Erosive gastritis - improved from prior. Biopsied                            for H. pylori assessment                           - No gross lesions in the duodenal bulb, in the                            first portion of the duodenum and in the second                            portion of the duodenum.                           - Prior biliary sphincterotomy appeared open.                           - One visibly patent stent from the biliary tree                            was seen in the major papilla. The stent was                            removed.                           - The entire main bile duct was mildly dilated. The                            examination was suspicious for sludge. There                            appeared to be an irregularity in the left main                            hepatic duct.                           - The biliary tree was swept and sludge was found.                           -  Cholangioscopy suggested an angulation deformity                            at the left main hepatic duct with what appeared to                            be a cystic duct insertion. Rest of the                            cholangioscopy showed normal appearing biliary                            mucosa. Moderate Sedation:      Not Applicable - Patient had care per Anesthesia. Recommendation:           - The patient will be observed post-procedure,                            until all discharge criteria are met.                           - Discharge patient to home.                           - Observe patient's  clinical course.                           - Continue present medications.                           - Await path results.                           - Watch for pancreatitis, bleeding, perforation,                            and cholangitis.                           - Repeat LFTs in 2 to 4 weeks.                           - Xarelto  restart tomorrow.                           - Ciprofloxacin  500 mg BID x 3-days.                           - The findings and recommendations were discussed                            with the patient.                           - The findings and recommendations were discussed  with the designated responsible adult. Procedure Code(s):        --- Professional ---                           [phone], Endoscopic retrograde                            cholangiopancreatography (ERCP); with removal of                            foreign body(s) or stent(s) from biliary/pancreatic                            duct(s)                           43264, Endoscopic retrograde                            cholangiopancreatography (ERCP); with removal of                            calculi/debris from biliary/pancreatic duct(s)                           43273, Endoscopic cannulation of papilla with                            direct visualization of pancreatic/common bile                            duct(s) (List separately in addition to code(s) for                            primary procedure)                           25671, Endoscopic catheterization of the biliary                            ductal system, radiological supervision and                            interpretation Diagnosis Code(s):        --- Professional ---                           K22.89, Other specified disease of esophagus                           K29.60, Other gastritis without bleeding                           Z96.89, Presence of other specified functional                             implants  R93.2, Abnormal findings on diagnostic imaging of                            liver and biliary tract                           Z46.59, Encounter for fitting and adjustment of                            other gastrointestinal appliance and device                           K80.50, Calculus of bile duct without cholangitis                            or cholecystitis without obstruction                           B96.81, Helicobacter pylori [H. pylori] as the                            cause of diseases classified elsewhere                           K76.6, Portal hypertension                           K83.8, Other specified diseases of biliary tract CPT copyright 2022 [employer]. All rights reserved. The codes documented in this report are preliminary and upon coder review may  be revised to meet current compliance requirements. Aloha Finner, MD 11/09/2023 9:47:47 AM Number of Addenda: 0

## 2023-11-09 NOTE — Transfer of Care (Signed)
 Immediate Anesthesia Transfer of Care Note  Patient: Darius [name]  Procedure(s) Performed: ERCP, WITH INTERVENTION IF INDICATED EGD (ESOPHAGOGASTRODUODENOSCOPY)  Patient Location: PACU  Anesthesia Type:General  Level of Consciousness: drowsy  Airway & Oxygen Therapy: Patient Spontanous Breathing and Patient connected to nasal cannula oxygen  Post-op Assessment: Report given to RN and Post -op Vital signs reviewed and stable  Post vital signs: Reviewed and stable  Last Vitals:  Vitals Value Taken Time  BP 135/75 11/09/23 09:40  Temp 36.5 C 11/09/23 09:31  Pulse 73 11/09/23 09:42  Resp 17 11/09/23 09:42  SpO2 95 % 11/09/23 09:42  Vitals shown include unfiled device data.  Last Pain:  Vitals:   11/09/23 0931  TempSrc: Temporal  PainSc: 0-No pain         Complications: No notable events documented.

## 2023-11-09 NOTE — H&P (Signed)
 GASTROENTEROLOGY PROCEDURE H&P NOTE   Primary Care Physician: Chandra Toribio POUR, MD  HPI: Darius [name] is a 68 y.o. male who presents for EGD/ERCP for history of biliary stricture and biliary stenting and underlying chronic liver disease.  Past Medical History:  Diagnosis Date   Chronic combined systolic and diastolic heart failure (HCC)    followed by cardiologist;  a. Echo 03/27/11: EF 50-55%, inferior hypokinesis, moderate LAE, mild RVE, normal pulmonary pressures. ;   b.  TEE (04/03/11): EF 40-45%, mild MR moderate LAE, no LAA clot, mild RAE;  c.  Echocardiogram (02/2013): EF 60-65%, Gr 2 DD, mildly dilated Ao root (Ao root dimension 39 mm), MAC, mild LAE, normal RVF   Complication of anesthesia    pt unable to tolerate versed  and fentanyl  sedation and required general anesthesia with TEE 02/ 2013   Depression    Diabetes mellitus, type 2 (HCC)    followed by pcp  (02-21-2021 pt stated does not check blood sugar )   Hx of cardiovascular stress test    Lexiscan  Myoview (02/2013): No ischemia or scar, EF 51%, low risk;  cardiac cath 04-21-2016  no CAD, NICM   Malignant neoplasm of urinary bladder (HCC) 12/2020   urologist--- dr nieves;  oncologist-- dr amadeo,  high grade muscle invasion   Morbid obesity (HCC)    NICM (nonischemic cardiomyopathy) (HCC) 04/2016   followed by cardiology;   dx 03/ 2018 per TEE ef 15%;  TEE 04/ 2018 ef 25%;  TEE 07/ 2018 ef 40%;  echo 12/ 2021 ef 55%   OSA (obstructive sleep apnea)    followed by dr t. turner--  no cpap/ bipap:: previously followed  by dr clance sleep study 02-11-2013  very severe osa w/ AHI 108/hr had used cpap until 2017:   repeat sleep study 02-14-2018 in epic severe osa w/ hypoxemia, AHI 46.3/hr titrated 04-15-2018 used cpap for awhile   Persistent atrial fibrillation (HCC) 03/2011   cardiologist--   s/p TEE-DCCV (failed);  Amiodarone  started   Past Surgical History:  Procedure Laterality Date   BILIARY BRUSHING  07/06/2023    Procedure: BRUSH BIOPSY, BILE DUCT;  Surgeon: Wilhelmenia Aloha Raddle., MD;  Location: [facility] ENDOSCOPY;  Service: Gastroenterology;;   BILIARY STENT PLACEMENT  07/06/2023   Procedure: INSERTION, STENT, BILE DUCT;  Surgeon: Wilhelmenia Aloha Raddle., MD;  Location: [facility] ENDOSCOPY;  Service: Gastroenterology;;   BIOPSY OF SKIN SUBCUTANEOUS TISSUE AND/OR MUCOUS MEMBRANE  07/06/2023   Procedure: BIOPSY, SKIN, SUBCUTANEOUS TISSUE, OR MUCOUS MEMBRANE;  Surgeon: Wilhelmenia Aloha Raddle., MD;  Location: [facility] ENDOSCOPY;  Service: Gastroenterology;;   CARDIOVERSION  04/01/2011   Procedure: CARDIOVERSION;  Surgeon: Redell GORMAN Shallow, MD;  Location: [facility] ENDOSCOPY;  Service: Cardiovascular;  Laterality: N/A;   CARDIOVERSION  04/03/2011   Procedure: CARDIOVERSION;  Surgeon: Redell GORMAN Shallow, MD;  Location: [facility] ENDOSCOPY;  Service: Cardiovascular;  Laterality: N/A;   CARDIOVERSION  04/03/2011   Procedure: CARDIOVERSION;  Surgeon: Redell GORMAN Shallow, MD;  [facility] OR;  Service: Cardiovascular;  Laterality: N/A;   CARDIOVERSION N/A 06/15/2014   Procedure: CARDIOVERSION;  Surgeon: Ezra GORMAN Shuck, MD;  Location: [facility] ENDOSCOPY;  Service: Cardiovascular;  Laterality: N/A;   CARDIOVERSION N/A 09/08/2016   Procedure: CARDIOVERSION;  Surgeon: Shuck Ezra GORMAN, MD;  Location: [facility] ENDOSCOPY;  Service: Cardiovascular;  Laterality: N/A;   CHOLECYSTECTOMY N/A 07/07/2023   Procedure: LAPAROSCOPIC CHOLECYSTECTOMY;  Surgeon: Teresa Lonni HERO, MD;  Location: [facility] OR;  Service: General;  Laterality: N/A;  WITH ICG DYE  ERCP N/A 07/06/2023   Procedure: ERCP, WITH INTERVENTION IF INDICATED;  Surgeon: Wilhelmenia Aloha Raddle., MD;  Location: [facility] ENDOSCOPY;  Service: Gastroenterology;  Laterality: N/A;   I & D EXTREMITY Right 09/19/2015   Procedure: IRRIGATION AND DEBRIDEMENT EXTREMITY;  Surgeon: Lonni CINDERELLA Poli, MD;  Location: [facility] OR;  Service: Orthopedics;  Laterality: Right;   RIGHT/LEFT HEART CATH AND CORONARY ANGIOGRAPHY N/A 04/21/2016    Procedure: Right/Left Heart Cath and Coronary Angiography;  Surgeon: Ezra GORMAN Shuck, MD;  Location: [facility] INVASIVE CV LAB;  Service: Cardiovascular;  Laterality: N/A;   SPHINCTEROTOMY  07/06/2023   Procedure: SPHINCTEROTOMY, BILIARY;  Surgeon: Wilhelmenia Aloha Raddle., MD;  Location: [facility] ENDOSCOPY;  Service: Gastroenterology;;   TEE WITHOUT CARDIOVERSION  04/01/2011   Procedure: TRANSESOPHAGEAL ECHOCARDIOGRAM (TEE);  Surgeon: Redell GORMAN Shallow, MD;  Location: [facility] ENDOSCOPY;  Service: Cardiovascular;  Laterality: N/A;   TEE WITHOUT CARDIOVERSION  04/03/2011   Procedure: TRANSESOPHAGEAL ECHOCARDIOGRAM (TEE);  Surgeon: Redell GORMAN Shallow, MD;  Location: [facility] ENDOSCOPY;  Service: Cardiovascular;  Laterality: N/A;   TEE WITHOUT CARDIOVERSION  04/03/2011   Procedure: TRANSESOPHAGEAL ECHOCARDIOGRAM (TEE);  Surgeon: Redell GORMAN Shallow, MD;  Location: [facility] OR;  Service: Cardiovascular;  Laterality: N/A;   TEE WITHOUT CARDIOVERSION N/A 04/24/2016   Procedure: TRANSESOPHAGEAL ECHOCARDIOGRAM (TEE);  Surgeon: Ezra GORMAN Shuck, MD;  Location: [facility] ENDOSCOPY;  Service: Cardiovascular;  Laterality: N/A;   TEE WITHOUT CARDIOVERSION N/A 05/26/2016   Procedure: TRANSESOPHAGEAL ECHOCARDIOGRAM (TEE);  Surgeon: Ezra GORMAN Shuck, MD;  Location: [facility] ENDOSCOPY;  Service: Cardiovascular;  Laterality: N/A;   TEE WITHOUT CARDIOVERSION N/A 09/08/2016   Procedure: TRANSESOPHAGEAL ECHOCARDIOGRAM (TEE);  Surgeon: Shuck Ezra GORMAN, MD;  Location: [facility] ENDOSCOPY;  Service: Cardiovascular;  Laterality: N/A;   TRANSURETHRAL RESECTION OF BLADDER TUMOR N/A 12/14/2020   Procedure: TRANSURETHRAL RESECTION OF BLADDER TUMOR (TURBT) 2-5 cm/ POST OPERTIVE INSTILLATION OF GEMCITABINE ;  Surgeon: Nieves Cough, MD;  Location: [facility];  Service: Urology;  Laterality: N/A;   TRANSURETHRAL RESECTION OF BLADDER TUMOR N/A 02/22/2021   Procedure: TRANSURETHRAL RESECTION OF BLADDER TUMOR (TURBT);  Surgeon: Nieves Cough, MD;  Location: [facility];  Service: Urology;  Laterality: N/A;   Current Facility-Administered Medications  Medication Dose Route Frequency Provider Last Rate Last Admin   0.9 %  sodium chloride  infusion   Intravenous Continuous Mansouraty, Alleya Demeter Jr., MD       ampicillin-sulbactam (UNASYN) 1.5 g in sodium chloride  0.9 % 100 mL IVPB  1.5 g Intravenous Once Mansouraty, Baylynn Shifflett Jr., MD       lactated ringers  infusion    Continuous PRN Mansouraty, Aloha Raddle., MD 10 mL/hr at 11/09/23 0711 1,000 mL at 11/09/23 0711    Current Facility-Administered Medications:    0.9 %  sodium chloride  infusion, , Intravenous, Continuous, Mansouraty, Aloha Raddle., MD   ampicillin-sulbactam (UNASYN) 1.5 g in sodium chloride  0.9 % 100 mL IVPB, 1.5 g, Intravenous, Once, Mansouraty, Aloha Raddle., MD   lactated ringers  infusion, , , Continuous PRN, Mansouraty, Aloha Raddle., MD, Last Rate: 10 mL/hr at 11/09/23 0711, 1,000 mL at 11/09/23 0711 No Known Allergies Family History  Problem Relation Age of Onset   Seizures Mother        died in her 36s   Diabetes Father        died in his 83s   Hypertension Father    Hypertension Sister    Hypertension Paternal Grandfather    Heart attack Neg Hx    Stroke Neg Hx    Colon cancer  Neg Hx    Esophageal cancer Neg Hx    Inflammatory bowel disease Neg Hx    Liver disease Neg Hx    Pancreatic cancer Neg Hx    Rectal cancer Neg Hx    Stomach cancer Neg Hx    Social History   Socioeconomic History   Marital status: Single    Spouse name: Not on file   Number of children: 0   Years of education: 12   Highest education level: Not on file  Occupational History   Occupation: unemployeed  Tobacco Use   Smoking status: Never   Smokeless tobacco: Never  Vaping Use   Vaping status: Never Used  Substance and Sexual Activity   Alcohol use: Yes    Comment: some   Drug use: Not Currently   Sexual activity: Not on file  Other Topics Concern   Not on file  Social History  Narrative   Admits that he eats poorly and eats a lot at work.      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   Social Drivers of Health   Financial Resource Strain: High Risk (07/30/2020)   Overall Financial Resource Strain (CARDIA)    Difficulty of Paying Living Expenses: Hard  Food Insecurity: Food Insecurity Present (07/03/2023)   Hunger Vital Sign    Worried About Running Out of Food in the Last Year: Sometimes true    Ran Out of Food in the Last Year: Sometimes true  Transportation Needs: No Transportation Needs (07/03/2023)   PRAPARE - Administrator, Civil Service (Medical): No    Lack of Transportation (Non-Medical): No  Physical Activity: Inactive (07/30/2020)   Exercise Vital Sign    Days of Exercise per Week: 0 days    Minutes of Exercise per Session: 0 min  Stress: Stress Concern Present (07/30/2020)   [employer] of Occupational Health - Occupational Stress Questionnaire    Feeling of Stress : To some extent  Social Connections: Socially Isolated (07/03/2023)   Social Connection and Isolation Panel    Frequency of Communication with Friends and Family: More than three times a week    Frequency of Social Gatherings with Friends and Family: Never    Attends Religious Services: Never    Database administrator or Organizations: No    Attends Banker Meetings: Never    Marital Status: Never married  Intimate Partner Violence: Not At Risk (07/03/2023)   Humiliation, Afraid, Rape, and Kick questionnaire    Fear of Current or Ex-Partner: No    Emotionally Abused: No    Physically Abused: No    Sexually Abused: No    Physical Exam: Today's Vitals   11/09/23 0707  BP: (!) 145/88  Pulse: 89  Resp: 12  Temp: 97.6 F (36.4 C)  TempSrc: Temporal  SpO2: 97%  Weight: 111.6 kg  Height: 6' 2 (1.88 m)  PainSc: 0-No pain   Body mass index is 31.59 kg/m. GEN: NAD EYE: Sclerae anicteric ENT: MMM CV: Non-tachycardic GI:  Soft, NT/ND NEURO:  Alert & Oriented x 3  Lab Results: No results for input(s): WBC, HGB, HCT, PLT in the last 72 hours. BMET No results for input(s): NA, K, CL, CO2, GLUCOSE, BUN, CREATININE, CALCIUM  in the last 72 hours. LFT No results for input(s): PROT, ALBUMIN, AST, ALT, ALKPHOS, BILITOT, BILIDIR, IBILI in the last 72 hours. PT/INR No results for input(s): LABPROT, INR in the last 72 hours.   Impression /  Plan: This is a 68 y.o.male who presents for EGD/ERCP for history of biliary stricture and biliary stenting and underlying chronic liver disease.  The risks of an ERCP were discussed at length, including but not limited to the risk of perforation, bleeding, abdominal pain, post-ERCP pancreatitis (while usually mild can be severe and even life threatening).   The risks and benefits of endoscopic evaluation/treatment were discussed with the patient and/or family; these include but are not limited to the risk of perforation, infection, bleeding, missed lesions, lack of diagnosis, severe illness requiring hospitalization, as well as anesthesia and sedation related illnesses.  The patient's history has been reviewed, patient examined, no change in status, and deemed stable for procedure.  The patient and/or family is agreeable to proceed.    Aloha Finner, MD [facility] Office # [phone]

## 2023-11-09 NOTE — Anesthesia Procedure Notes (Signed)
 Procedure Name: Intubation Date/Time: 11/09/2023 8:38 AM  Performed by: Gladis Honey, CRNAPre-anesthesia Checklist: Patient identified, Emergency Drugs available, Suction available and Patient being monitored Patient Re-evaluated:Patient Re-evaluated prior to induction Oxygen Delivery Method: Circle System Utilized Preoxygenation: Pre-oxygenation with 100% oxygen Induction Type: IV induction Ventilation: Mask ventilation without difficulty Laryngoscope Size: Miller and 2 Grade View: Grade II Tube type: Oral Tube size: 7.5 mm Number of attempts: 1 Airway Equipment and Method: Stylet and Oral airway Placement Confirmation: ETT inserted through vocal cords under direct vision, positive ETCO2 and breath sounds checked- equal and bilateral Secured at: 23 cm Tube secured with: Tape Dental Injury: Teeth and Oropharynx as per pre-operative assessment

## 2023-11-09 NOTE — Anesthesia Postprocedure Evaluation (Signed)
 Anesthesia Post Note  Patient: Darius [name]  Procedure(s) Performed: ERCP, WITH INTERVENTION IF INDICATED EGD (ESOPHAGOGASTRODUODENOSCOPY)     Patient location during evaluation: PACU Anesthesia Type: General Level of consciousness: awake and alert Pain management: pain level controlled Vital Signs Assessment: post-procedure vital signs reviewed and stable Respiratory status: spontaneous breathing, nonlabored ventilation, respiratory function stable and patient connected to nasal cannula oxygen Cardiovascular status: blood pressure returned to baseline and stable Postop Assessment: no apparent nausea or vomiting Anesthetic complications: no   No notable events documented.  Last Vitals:  Vitals:   11/09/23 0950 11/09/23 1000  BP: 118/65 128/73  Pulse: 78 68  Resp: 18 10  Temp:    SpO2: 96% 97%    Last Pain:  Vitals:   11/09/23 1000  TempSrc:   PainSc: 0-No pain                 Garnette DELENA Gab

## 2023-11-10 ENCOUNTER — Other Ambulatory Visit (HOSPITAL_COMMUNITY): Payer: Self-pay

## 2023-11-10 ENCOUNTER — Other Ambulatory Visit: Payer: Self-pay | Admitting: Family Medicine

## 2023-11-10 ENCOUNTER — Other Ambulatory Visit: Payer: Self-pay

## 2023-11-10 ENCOUNTER — Ambulatory Visit: Payer: Self-pay | Admitting: Gastroenterology

## 2023-11-10 ENCOUNTER — Encounter (HOSPITAL_COMMUNITY): Payer: Self-pay | Admitting: Gastroenterology

## 2023-11-10 DIAGNOSIS — Z9889 Other specified postprocedural states: Secondary | ICD-10-CM

## 2023-11-10 DIAGNOSIS — K831 Obstruction of bile duct: Secondary | ICD-10-CM

## 2023-11-10 MED ORDER — OZEMPIC (0.25 OR 0.5 MG/DOSE) 2 MG/3ML ~~LOC~~ SOPN
0.5000 mg | PEN_INJECTOR | SUBCUTANEOUS | 1 refills | Status: DC
  Filled 2023-11-10: qty 3, 28d supply, fill #0
  Filled 2023-12-08: qty 3, 28d supply, fill #1

## 2023-11-11 LAB — SURGICAL PATHOLOGY

## 2023-11-12 ENCOUNTER — Other Ambulatory Visit (HOSPITAL_COMMUNITY): Payer: Self-pay

## 2023-11-13 ENCOUNTER — Other Ambulatory Visit: Payer: Self-pay

## 2023-11-13 ENCOUNTER — Other Ambulatory Visit (HOSPITAL_COMMUNITY): Payer: Self-pay

## 2023-11-13 ENCOUNTER — Other Ambulatory Visit: Payer: Self-pay | Admitting: Family Medicine

## 2023-11-13 MED ORDER — TAMSULOSIN HCL 0.4 MG PO CAPS
0.4000 mg | ORAL_CAPSULE | Freq: Every day | ORAL | 0 refills | Status: AC
  Filled 2023-11-13 – 2023-11-19 (×2): qty 30, 30d supply, fill #0
  Filled 2023-12-21: qty 30, 30d supply, fill #1
  Filled 2024-01-19: qty 30, 30d supply, fill #2

## 2023-11-19 ENCOUNTER — Other Ambulatory Visit: Payer: Self-pay

## 2023-11-23 ENCOUNTER — Other Ambulatory Visit: Payer: Self-pay

## 2023-11-24 ENCOUNTER — Encounter: Payer: Self-pay | Admitting: Family Medicine

## 2023-11-24 ENCOUNTER — Ambulatory Visit (INDEPENDENT_AMBULATORY_CARE_PROVIDER_SITE_OTHER): Admitting: Family Medicine

## 2023-11-24 VITALS — BP 125/73 | HR 94 | Ht 74.0 in | Wt 257.0 lb

## 2023-11-24 DIAGNOSIS — Z794 Long term (current) use of insulin: Secondary | ICD-10-CM | POA: Diagnosis not present

## 2023-11-24 DIAGNOSIS — E119 Type 2 diabetes mellitus without complications: Secondary | ICD-10-CM | POA: Diagnosis not present

## 2023-11-24 LAB — POCT GLYCOSYLATED HEMOGLOBIN (HGB A1C): HbA1c POC (<> result, manual entry): 5.9 % (ref 4.0–5.6)

## 2023-11-24 NOTE — Patient Instructions (Signed)
 It was nice to see you today,  We addressed the following topics today: - Increase Ozempic  to 0.5 mg weekly by turning the pen dial to 0.5. - when you increase your ozempic  to 0.5 mg I want you to decrease your daily insulin  dose to 15 units. - These changes should start with the next scheduled Ozempic  injection.   Have a great day,  Rolan Slain, MD

## 2023-11-24 NOTE — Assessment & Plan Note (Signed)
-   Diagnosis of type 2 diabetes, currently managed with insulin  and Ozempic . A1c is at goal (5.9). Patient reports variable adherence to morning medications due to sleep schedule but is consistent with insulin  and weekly Ozempic  injections. Plan is to increase Ozempic  to improve glycemic control and potentially wean off insulin . - Increase Ozempic  to 0.5 mg weekly. - Decrease insulin  to 15 units daily. - Changes to be initiated at the time of the next Ozempic  dose. - Follow up in January or February.

## 2023-11-24 NOTE — Progress Notes (Signed)
   Established Patient Office Visit  Subjective   Patient ID: KAYON DOZIER, male    DOB: 04/29/1955  Age: 68 y.o. MRN: [id number]  Chief Complaint  Patient presents with   Medical Management of Chronic Issues    HPI  Subjective - Reports annoying sensation in fingertips of the right hand, not painful. Occurs all day but is more noticeable at night when still. Describes it as not a pain, just an annoying feeling. Reports difficulty using the right hand to operate a touchscreen phone, whereas the left hand works fine. Symptoms are worse when the temperature is below 40 degrees. Denies symptoms in feet. No swelling in fingers or feet. - Reports non-adherence to medications in pill packs, specifically morning doses, due to a variable sleep schedule. Is taking afternoon/evening doses reliably. Reports having an extra month's supply of medications and has not yet started the most recent month's pill pack. Was advised to discard the old, unused pill packs. - Reports taking insulin  injections religiously.  Medications Current medications include insulin  20 units daily, and Ozempic  (semaglutide ) 0.25 mg weekly. Reports inconsistent adherence with other medications provided in pill packs, particularly morning doses.  PMH, PSH, FH, Social Hx PMHx: Diabetes mellitus type 2. PSHx: Cholecystectomy with ERCP for stent placement and subsequent removal. Last procedure was two weeks prior to this visit.  ROS Constitutional: Denies pain. Extremities: Reports sensation in fingertips. Denies swelling in hands or feet.       The 10-year ASCVD risk score (Arnett DK, et al., 2019) is: 31.2%  Health Maintenance Due  Topic Date Due   OPHTHALMOLOGY EXAM  09/01/2020   Medicare Annual Wellness (AWV)  07/30/2021   COVID-19 Vaccine (4 - 2025-26 season) 10/12/2023      Objective:     BP 125/73   Pulse 94   Ht 6' 2 (1.88 m)   Wt 257 lb (116.6 kg)   SpO2 96%   BMI 33.00 kg/m    Physical  Exam Gen: alert, oriented Pulm: no respiratory distress Psych: pleasant affect   Results for orders placed or performed in visit on 11/24/23  POCT glycosylated hemoglobin (Hb A1C)  Result Value Ref Range   Hemoglobin A1C     HbA1c POC (<> result, manual entry) 5.9 4.0 - 5.6 %   HbA1c, POC (prediabetic range)     HbA1c, POC (controlled diabetic range)          Assessment & Plan:   Type 2 diabetes mellitus without complication, with long-term current use of insulin  (HCC) Assessment & Plan: - Diagnosis of type 2 diabetes, currently managed with insulin  and Ozempic . A1c is at goal (5.9). Patient reports variable adherence to morning medications due to sleep schedule but is consistent with insulin  and weekly Ozempic  injections. Plan is to increase Ozempic  to improve glycemic control and potentially wean off insulin . - Increase Ozempic  to 0.5 mg weekly. - Decrease insulin  to 15 units daily. - Changes to be initiated at the time of the next Ozempic  dose. - Follow up in January or February.  Orders: -     POCT glycosylated hemoglobin (Hb A1C)     Return in about 4 months (around 03/26/2024) for DM.    Toribio MARLA Slain, MD

## 2023-12-08 ENCOUNTER — Other Ambulatory Visit: Payer: Self-pay

## 2023-12-08 ENCOUNTER — Other Ambulatory Visit (HOSPITAL_COMMUNITY): Payer: Self-pay

## 2023-12-09 ENCOUNTER — Other Ambulatory Visit: Payer: Self-pay

## 2023-12-14 ENCOUNTER — Ambulatory Visit: Admitting: Urology

## 2023-12-14 VITALS — BP 118/78 | HR 89

## 2023-12-14 DIAGNOSIS — Z08 Encounter for follow-up examination after completed treatment for malignant neoplasm: Secondary | ICD-10-CM

## 2023-12-14 DIAGNOSIS — Z8551 Personal history of malignant neoplasm of bladder: Secondary | ICD-10-CM

## 2023-12-14 DIAGNOSIS — C678 Malignant neoplasm of overlapping sites of bladder: Secondary | ICD-10-CM | POA: Diagnosis not present

## 2023-12-14 DIAGNOSIS — R972 Elevated prostate specific antigen [PSA]: Secondary | ICD-10-CM

## 2023-12-14 LAB — URINALYSIS, ROUTINE W REFLEX MICROSCOPIC
Bilirubin, UA: NEGATIVE
Glucose, UA: NEGATIVE
Ketones, UA: NEGATIVE
Nitrite, UA: NEGATIVE
Protein,UA: NEGATIVE
RBC, UA: NEGATIVE
Specific Gravity, UA: 1.025 (ref 1.005–1.030)
Urobilinogen, Ur: 0.2 mg/dL (ref 0.2–1.0)
pH, UA: 5.5 (ref 5.0–7.5)

## 2023-12-14 LAB — MICROSCOPIC EXAMINATION: Bacteria, UA: NONE SEEN

## 2023-12-14 MED ORDER — CIPROFLOXACIN HCL 500 MG PO TABS
500.0000 mg | ORAL_TABLET | Freq: Once | ORAL | Status: AC
  Administered 2023-12-14: 500 mg via ORAL

## 2023-12-14 NOTE — Progress Notes (Unsigned)
  Darius [name]  12/14/23  CC: No chief complaint on file.   HPI:  F/u -    1) bladder cancer-patient diagnosed Nov 2022 with high-grade T2 right bladder, right bladder neck and left high-grade CIS, and right bladder and superior high-grade T1 disease. In Jan 2023, repeat/max TUR with large patches on the bladder neck, right bladder wall, superior, left bladder wall with residual disease.  From Jan - Mar 2023 he underwent radiation+chemo to preserve bladder. Apr 2025 cystoscopy benign.    Primary treatment: Jan - Mar 2023 - max TUR+radiation+chemo   Biopsy/TUR: November 2022-TURBT - HG T2 right, right BN HG CIS, right wall HG T1, superior HG T1, left bladder wall HG CIS Jan 2023 - HGT1, CIS    Staging/surveillance: Aug 2022 CT abd and pelvis 08/22 - a 2.5 cm right posterior bladder mass. No LAD or bone lesions.  Jul 2023 CT C/A/P - no evidence of disease  Oct 2023 CT C/A/P - NED Nov 2024 CT C/A/P - NED  April 2025 CT chest abdomen and pelvis benign.  He then had abdominal pain and underwent a 07/03/2023 CT abdomen and pelvis in the 07/04/2023 MR abdomen.  All these were benign.  Underwent a 07/07/2023 laparoscopic cholecystectomy.   Surveillance ([facility]):  CTU or MRU (image upper tracts + axial imaging of abdomen/pelvis) every 3-6 mo x 2 years and consider annually   CT chest (preferred) or chest x-ray every 3-6 mo x 2 years and then annually    2) freq, urgency - AUASS = 14. Urgency and nocturia. On tamsulosin  and oxybutynin . He had a sleep study pending from pulmonology.  He has obesity. His Cr was 1.01 Nov 2020 and 1.92 in Apr 2023. AuASS = 2. His blood sugar was high and his Cr was as high as 2.06 Apr 2021 and 1.92 in Apr 2023. Cr 1.57 in Nov 2023. On insulin  in 2024.    3) PSA elevation - His 2023 PSA was 6.9, Dec 2024 PSA 4.5. No biopsy. Small prostate on imaging and benign MAY 2025. June 2025 PSA 5.2.  Prostate was 37 g on May 2025 CT (PSAD 0.14).  Imaging benign.   Today, seen for  the above. For cystoscopy and management of PSA elevation.   He has a history of A. fib and an atrial thrombus and is on Eliquis . 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 Non-smoker.    Blood pressure 118/78, pulse 89. NED. A&Ox3.   No respiratory distress   Abd soft, NT, ND Normal phallus with bilateral descended testicles  Cystoscopy Procedure Note  Patient identification was confirmed, informed consent was obtained, and patient was prepped using Betadine solution.  Lidocaine  jelly was administered per urethral meatus.     Pre-Procedure: - Inspection reveals a normal caliber ureteral meatus.  Procedure: The flexible cystoscope was introduced without difficulty - No urethral strictures/lesions are present. - normal prostate with BPH and lateral lobe obs, mild IPP, no median lobe.  - normal bladder neck - Bilateral ureteral orifices identified - Bladder mucosa  reveals no ulcers, tumors, or lesions - No bladder stones - No trabeculation  Retroflexion shows normal bladder and bladder neck    Post-Procedure: - Patient tolerated the procedure well  Assessment/ Plan:  Bladder ca - NED. Consider imaging in 2026 especially if any symptoms.   PSA elevation - has labs with PCP in FEB 2026. F/u to review and consider repeat PSA.   No follow-ups on file.  Donnice Brooks, MD

## 2023-12-17 LAB — OPHTHALMOLOGY REPORT-SCANNED

## 2023-12-21 ENCOUNTER — Other Ambulatory Visit: Payer: Self-pay

## 2023-12-22 ENCOUNTER — Other Ambulatory Visit: Payer: Self-pay

## 2023-12-23 ENCOUNTER — Other Ambulatory Visit: Payer: Self-pay

## 2023-12-23 ENCOUNTER — Other Ambulatory Visit (HOSPITAL_COMMUNITY): Payer: Self-pay

## 2023-12-24 ENCOUNTER — Other Ambulatory Visit: Payer: Self-pay

## 2023-12-29 ENCOUNTER — Other Ambulatory Visit: Payer: Self-pay

## 2024-01-05 ENCOUNTER — Other Ambulatory Visit: Payer: Self-pay

## 2024-01-06 ENCOUNTER — Other Ambulatory Visit: Payer: Self-pay

## 2024-01-15 ENCOUNTER — Other Ambulatory Visit: Payer: Self-pay

## 2024-01-19 ENCOUNTER — Other Ambulatory Visit: Payer: Self-pay

## 2024-01-19 ENCOUNTER — Other Ambulatory Visit (HOSPITAL_COMMUNITY): Payer: Self-pay

## 2024-01-20 ENCOUNTER — Other Ambulatory Visit: Payer: Self-pay

## 2024-01-20 ENCOUNTER — Other Ambulatory Visit (HOSPITAL_COMMUNITY): Payer: Self-pay

## 2024-01-20 ENCOUNTER — Other Ambulatory Visit (HOSPITAL_BASED_OUTPATIENT_CLINIC_OR_DEPARTMENT_OTHER): Payer: Self-pay

## 2024-02-17 ENCOUNTER — Other Ambulatory Visit (HOSPITAL_COMMUNITY): Payer: Self-pay

## 2024-02-17 ENCOUNTER — Other Ambulatory Visit: Payer: Self-pay | Admitting: Family Medicine

## 2024-02-17 MED ORDER — OZEMPIC (0.25 OR 0.5 MG/DOSE) 2 MG/3ML ~~LOC~~ SOPN
0.5000 mg | PEN_INJECTOR | SUBCUTANEOUS | 1 refills | Status: AC
  Filled 2024-02-17: qty 3, 28d supply, fill #0

## 2024-02-18 ENCOUNTER — Other Ambulatory Visit: Payer: Self-pay

## 2024-02-18 ENCOUNTER — Other Ambulatory Visit (HOSPITAL_COMMUNITY): Payer: Self-pay

## 2024-02-25 ENCOUNTER — Other Ambulatory Visit (HOSPITAL_COMMUNITY): Payer: Self-pay

## 2024-02-25 ENCOUNTER — Encounter: Payer: Self-pay | Admitting: Pharmacist

## 2024-02-25 ENCOUNTER — Other Ambulatory Visit: Payer: Self-pay

## 2024-03-01 ENCOUNTER — Other Ambulatory Visit: Payer: Self-pay

## 2024-03-02 ENCOUNTER — Other Ambulatory Visit (HOSPITAL_COMMUNITY): Payer: Self-pay

## 2024-03-03 ENCOUNTER — Other Ambulatory Visit (HOSPITAL_COMMUNITY): Payer: Self-pay

## 2024-03-03 ENCOUNTER — Telehealth: Payer: Self-pay

## 2024-03-03 NOTE — Telephone Encounter (Signed)
 Pharmacy Patient Advocate Encounter   Received notification from [employer] that prior authorization for sOzempic (0.25 or 0.5 MG/DOSE) 2MG /3ML pen-injectors  i required/requested.   Insurance verification completed.   The patient is insured through [employer]   Per test claim: PA required; PA submitted to above mentioned insurance via Latent Key/confirmation #/EOC [id number] Status is pending

## 2024-03-07 NOTE — Telephone Encounter (Signed)
 Pharmacy Patient Advocate Encounter  Received notification from [employer] that Prior Authorization for Ozempic  (0.25 or 0.5 MG/DOSE) 2MG /3ML pen-injectors   has been APPROVED from 03/03/2024/ to 02/09/2025   PA #/Case ID/Reference #: [id number]

## 2024-03-21 ENCOUNTER — Other Ambulatory Visit

## 2024-03-28 ENCOUNTER — Ambulatory Visit: Admitting: Family Medicine

## 2024-03-30 ENCOUNTER — Ambulatory Visit: Admitting: Family Medicine

## 2024-04-11 ENCOUNTER — Ambulatory Visit: Admitting: Urology
